# Patient Record
Sex: Female | Born: 2000 | Race: Black or African American | Hispanic: No | Marital: Single | Smoking: Never smoker
Health system: Southern US, Community
[De-identification: ages and names within clinical notes are randomized; demographics above are authoritative.]

## PROBLEM LIST (undated history)

## (undated) DIAGNOSIS — E063 Autoimmune thyroiditis: Secondary | ICD-10-CM

## (undated) HISTORY — DX: Autoimmune thyroiditis: E06.3

---
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
## 2021-01-14 ENCOUNTER — Other Ambulatory Visit (INDEPENDENT_AMBULATORY_CARE_PROVIDER_SITE_OTHER): Payer: Self-pay | Admitting: Family

## 2021-01-14 DIAGNOSIS — E039 Hypothyroidism, unspecified: Secondary | ICD-10-CM

## 2021-01-15 ENCOUNTER — Ambulatory Visit (INDEPENDENT_AMBULATORY_CARE_PROVIDER_SITE_OTHER): Payer: Medicaid Other | Admitting: Internal Medicine

## 2021-01-15 ENCOUNTER — Other Ambulatory Visit: Payer: Self-pay

## 2021-01-15 ENCOUNTER — Encounter: Payer: Self-pay | Admitting: Internal Medicine

## 2021-01-15 VITALS — BP 110/72 | HR 70 | Ht 62.02 in | Wt 139.2 lb

## 2021-01-15 DIAGNOSIS — E063 Autoimmune thyroiditis: Secondary | ICD-10-CM

## 2021-01-15 DIAGNOSIS — N926 Irregular menstruation, unspecified: Secondary | ICD-10-CM

## 2021-01-15 DIAGNOSIS — E039 Hypothyroidism, unspecified: Secondary | ICD-10-CM

## 2021-01-15 MED ORDER — LEVOTHYROXINE SODIUM 100 MCG PO TABS: 100.0000 ug | ORAL_TABLET | Freq: Every day | ORAL | 3 refills | Status: DC

## 2021-01-15 NOTE — Progress Notes (Signed)
Name: Jamie [name]  MRN/ DOB: [id number], 25-Oct-2000    Age/ Sex: 20 y.o., female     PCP: Ok Edwards, MD   Reason for Endocrinology Evaluation: 06/29/2020     Initial Endocrinology Clinic Visit: Hypothyroidism    Jamie [name] IDENTIFIER: Jamie [name] is a 20 y.o., female with a past medical history of hypothyroidism. Jamie [name] has followed with [facility] since 06/29/2020 for consultative assistance with management of her Hypothyroidism.   HISTORICAL SUMMARY:  Jamie [name] was diagnosed with hypothyroidism secondary to Hashimoto's Thyroiditis at the age of 13 with a TSh of 110 uIU/mL with low FT4 at 0.3 ng/dL and elevated TPO AB's > 900 IU/mL   No FH of thyroid disease   SUBJECTIVE:    Today (01/15/2021):  Jamie [name] is here for Hypothyroidism  Jamie [name] has ran out of Levothyroxine for ~ 2 month   Denies constipation, or weight gain  Has local neck swelling   Denies depression  LMP a month - irregular   Levothyroxine 100 mcg daily   HISTORY:  Past Medical History:  Past Medical History:  Diagnosis Date   Acquired autoimmune hypothyroidism    Dx 12/2014, TSH 110, FT4 0.3   Past Surgical History: No past surgical history on file. Social History:  reports that Jamie [name] has never smoked. Jamie [name] has never used smokeless tobacco. No history on file for alcohol use and drug use. Family History:  Family History  Problem Relation Age of Onset   Healthy Mother    Healthy Father      HOME MEDICATIONS: Allergies as of 01/15/2021   No Known Allergies      Medication List        Accurate as of January 15, 2021  2:43 PM. If you have any questions, ask your nurse or doctor.          levothyroxine 100 MCG tablet Commonly known as: SYNTHROID Take 1 tablet (100 mcg total) by mouth daily. What changed: See the new instructions. Changed by: Dorita Sciara, MD          OBJECTIVE:   PHYSICAL EXAM: VS: BP 110/72 (BP Location: Left Arm,  Jamie [name] Position: Sitting, Cuff Size: Small)   Pulse 70   Ht 5' 2.02" (1.575 m)   Wt 139 lb 3.2 oz (63.1 kg)   SpO2 98%   BMI 25.44 kg/m    EXAM: General: Jamie [name] appears well and is in NAD  Neck: General: Supple without adenopathy. Thyroid: Thyroid prominent.    Lungs: Clear with good BS bilat with no rales, rhonchi, or wheezes  Heart: Auscultation: RRR.  Abdomen: Normoactive bowel sounds, soft, nontender, without masses or organomegaly palpable  Extremities:  BL LE: No pretibial edema normal ROM and strength.  Mental Status: Judgment, insight: Intact Orientation: Oriented to time, place, and person Mood and affect: No depression, anxiety, or agitation     DATA REVIEWED:  Results for SAMINA, WEEKES (MRN [id number]) as of 01/15/2021 14:43  Ref. Range 07/07/2020 14:49  TSH Latest Ref Range: 0.40 - 5.00 uIU/mL 2.56     ASSESSMENT / PLAN / RECOMMENDATIONS:   Hashimoto's thyroiditis:  - Jamie [name] is clinically euthyroid  - Has noted local neck swelling  - Jamie [name] had ran out of her Levothyroxine 2 months ago but her refill request has been going to her pediatric endocrinologist instead.  - Discussed risk of myxedema coma with untreated hypothyroidism  - Discussed importance of compliance   Medications   Restart Levothyroxine 100 MCG daily  2. Irregular Menstruation   - Prolactin, estradiol, LH and FSH have been normal  - Jamie [name] to start charting menstruations for the next 4 months  - Discussed increased risk of uterine cancer with unopposed estogen effect - If remains irregular with normalization of TSH, will consider COC  Labs in 8 weeks   F/U in 4 months      Signed electronically by: Mack Guise, MD  [facility]  [facility] [address] [place], [place] [address] Phone: [phone] FAX: [phone]      CC: Ok Edwards, MD [address] [place] [address] Phone: [phone]  Fax:  [phone]   Return to Endocrinology clinic as below: Future Appointments  Date Time Provider [place]  01/15/2021  3:00 PM Doni Widmer, Melanie Crazier, MD [facility] None

## 2021-01-15 NOTE — Patient Instructions (Signed)

## 2021-02-05 ENCOUNTER — Ambulatory Visit (HOSPITAL_COMMUNITY)
Admission: EM | Admit: 2021-02-05 | Discharge: 2021-02-05 | Disposition: A | Discharge status: Home / Self Care | Payer: Medicaid Other | Attending: Medical Oncology | Admitting: Medical Oncology

## 2021-02-05 ENCOUNTER — Encounter (HOSPITAL_COMMUNITY): Payer: Self-pay | Admitting: Emergency Medicine

## 2021-02-05 ENCOUNTER — Other Ambulatory Visit: Payer: Self-pay

## 2021-02-05 DIAGNOSIS — R112 Nausea with vomiting, unspecified: Secondary | ICD-10-CM | POA: Diagnosis not present

## 2021-02-05 DIAGNOSIS — G43111 Migraine with aura, intractable, with status migrainosus: Secondary | ICD-10-CM

## 2021-02-05 LAB — POCT URINALYSIS DIPSTICK, ED / UC
Glucose, UA: NEGATIVE mg/dL
Ketones, ur: 40 mg/dL — AB
Leukocytes,Ua: NEGATIVE
Nitrite: NEGATIVE
Protein, ur: NEGATIVE mg/dL
Specific Gravity, Urine: 1.015 (ref 1.005–1.030)
Urobilinogen, UA: 0.2 mg/dL (ref 0.0–1.0)
pH: 5.5 (ref 5.0–8.0)

## 2021-02-05 LAB — POC URINE PREG, ED: Preg Test, Ur: NEGATIVE

## 2021-02-05 MED ORDER — ONDANSETRON 4 MG PO TBDP
4.0000 mg | ORAL_TABLET | Freq: Once | ORAL | Status: AC
  Administered 2021-02-05: 4 mg via ORAL

## 2021-02-05 MED ORDER — ONDANSETRON 4 MG PO TBDP
ORAL_TABLET | ORAL | Status: AC
  Filled 2021-02-05: qty 1

## 2021-02-05 MED ORDER — KETOROLAC TROMETHAMINE 30 MG/ML IJ SOLN
30.0000 mg | Freq: Once | INTRAMUSCULAR | Status: AC
  Administered 2021-02-05: 30 mg via INTRAMUSCULAR

## 2021-02-05 MED ORDER — KETOROLAC TROMETHAMINE 30 MG/ML IJ SOLN
INTRAMUSCULAR | Status: AC
  Filled 2021-02-05: qty 1

## 2021-02-05 NOTE — ED Triage Notes (Signed)
Pt c/o headache and vomiting since Monday. Reports fevers as well. Taking advil for fevers

## 2021-02-05 NOTE — ED Provider Notes (Signed)
[facility]    CSN: [id number] Arrival date & time: 02/05/21  1312      History   Chief Complaint Chief Complaint  Patient presents with   Emesis   Headache    HPI Zania Kalisz is a 20 y.o. female. She presents with her cousin who provides most of the history for patient. Pt seems apprehensive to answer questions and some answers are "yes/no".    HPI  Emesis: Patient with a history of thyroid disease presents with a history of headache and vomiting since Monday.  Vomiting occurs every time she tries to eat and is non-bloody. She has tried Pepto for symptoms without relief. She has not tried anything for headache. Headache 10/10 all over. She first reports that she does not get headaches often but then states that she gets a similar headache often. Additional symptoms include suspected fever which has resolved.   Past Medical History:  Diagnosis Date   Acquired autoimmune hypothyroidism    Dx 12/2014, TSH 110, FT4 0.3    Patient Active Problem List   Diagnosis Date Noted   Hashimoto's thyroiditis 06/29/2020   Irregular menses 06/29/2020   Diagnosis deferred 06/03/2019   Psychosocial stressors 05/07/2015   Primary hypothyroidism 01/21/2015   Goiter 01/20/2015   Failed vision screen 01/20/2015    History reviewed. No pertinent surgical history.  OB History   No obstetric history on file.      Home Medications    Prior to Admission medications   Medication Sig Start Date End Date Taking? Authorizing Provider  levothyroxine (SYNTHROID) 100 MCG tablet Take 1 tablet (100 mcg total) by mouth daily. 01/15/21   Shamleffer, Melanie Crazier, MD    Family History Family History  Problem Relation Age of Onset   Healthy Mother    Healthy Father     Social History Social History   Tobacco Use   Smoking status: Never   Smokeless tobacco: Never     Allergies   Patient has no known allergies.   Review of Systems Review of Systems  As stated  above in HPI Physical Exam Triage Vital Signs ED Triage Vitals  Enc Vitals Group     BP 02/05/21 1426 90/69     Pulse Rate 02/05/21 1426 87     Resp 02/05/21 1426 16     Temp 02/05/21 1426 98.8 F (37.1 C)     Temp Source 02/05/21 1426 Oral     SpO2 02/05/21 1426 99 %     Weight --      Height --      Head Circumference --      Peak Flow --      Pain Score 02/05/21 1422 10     Pain Loc --      Pain Edu? --      Excl. in [place]? --    No data found.  Updated Vital Signs BP 90/69 (BP Location: Left Arm)   Pulse 87   Temp 98.8 F (37.1 C) (Oral)   Resp 16   LMP 01/11/2021   SpO2 99%   Physical Exam Vitals and nursing note reviewed.  Constitutional:      General: She is not in acute distress.    Appearance: She is well-developed. She is not ill-appearing, toxic-appearing or diaphoretic.  HENT:     Head: Normocephalic and atraumatic.     Mouth/Throat:     Mouth: Mucous membranes are moist.     Pharynx: Oropharynx is clear.  Eyes:  General: No scleral icterus.    Extraocular Movements: Extraocular movements intact.     Right eye: Normal extraocular motion and no nystagmus.     Left eye: Normal extraocular motion and no nystagmus.     Pupils: Pupils are equal, round, and reactive to light. Pupils are equal.     Right eye: Pupil is round and reactive.     Left eye: Pupil is round and reactive.  Cardiovascular:     Rate and Rhythm: Normal rate and regular rhythm.     Heart sounds: Normal heart sounds.  Pulmonary:     Effort: Pulmonary effort is normal.     Breath sounds: Normal breath sounds.  Musculoskeletal:     Cervical back: Normal range of motion and neck supple. No rigidity.  Lymphadenopathy:     Cervical: No cervical adenopathy.  Skin:    General: Skin is warm.     Coloration: Skin is not cyanotic or pale.     Findings: No rash.  Neurological:     Mental Status: She is alert.     Cranial Nerves: No cranial nerve deficit or facial asymmetry.     Sensory:  No sensory deficit.     Motor: No weakness.     Gait: Gait normal.     Deep Tendon Reflexes: Reflexes normal.     Comments: Flat affect.      UC Treatments / Results  Labs (all labs ordered are listed, but only abnormal results are displayed) Labs Reviewed - No data to display  EKG   Radiology No results found.  Procedures Procedures (including critical care time)  Medications Ordered in UC Medications - No data to display  Initial Impression / Assessment and Plan / UC Course  I have reviewed the triage vital signs and the nursing notes.  Pertinent labs & imaging results that were available during my care of the patient were reviewed by me and considered in my medical decision making (see chart for details).     New. Urine HCG negative, shows some signs of dehydration. No sign of infection. I discussed my concerns with patient and her family member about her headache.  Initially she said that she does not get headaches often but then changed her story and said that she gets headaches fairly frequently.  They wish to avoid the emergency room as much as possible.  They have requested a trial of outpatient medications first.  I am agreeable with the low-dose Toradol along with Zofran to see if this helps with her symptoms.  Fluids and electrolytes encouraged.  If symptoms fail to improve or should she have any worsening symptoms she needs to go directly to the emergency room. Final Clinical Impressions(s) / UC Diagnoses   Final diagnoses:  None   Discharge Instructions   None    ED Prescriptions   None    PDMP not reviewed this encounter.   Hughie Closs, [place] 02/05/21 1537

## 2021-02-06 ENCOUNTER — Inpatient Hospital Stay (HOSPITAL_COMMUNITY)
Admission: EM | Admit: 2021-02-06 | Discharge: 2021-03-16 | DRG: 004 | Disposition: A | Discharge status: Other Acute Inpatient Hospital | Payer: Medicaid Other | Attending: Critical Care Medicine | Admitting: Critical Care Medicine

## 2021-02-06 ENCOUNTER — Other Ambulatory Visit: Payer: Self-pay

## 2021-02-06 ENCOUNTER — Encounter (HOSPITAL_COMMUNITY): Payer: Self-pay | Admitting: Emergency Medicine

## 2021-02-06 DIAGNOSIS — Z810 Family history of intellectual disabilities: Secondary | ICD-10-CM

## 2021-02-06 DIAGNOSIS — H5704 Mydriasis: Secondary | ICD-10-CM | POA: Diagnosis not present

## 2021-02-06 DIAGNOSIS — E8809 Other disorders of plasma-protein metabolism, not elsewhere classified: Secondary | ICD-10-CM | POA: Diagnosis not present

## 2021-02-06 DIAGNOSIS — R040 Epistaxis: Secondary | ICD-10-CM

## 2021-02-06 DIAGNOSIS — D279 Benign neoplasm of unspecified ovary: Secondary | ICD-10-CM

## 2021-02-06 DIAGNOSIS — R069 Unspecified abnormalities of breathing: Secondary | ICD-10-CM

## 2021-02-06 DIAGNOSIS — R768 Other specified abnormal immunological findings in serum: Secondary | ICD-10-CM | POA: Diagnosis present

## 2021-02-06 DIAGNOSIS — E039 Hypothyroidism, unspecified: Secondary | ICD-10-CM | POA: Diagnosis present

## 2021-02-06 DIAGNOSIS — Z79890 Hormone replacement therapy (postmenopausal): Secondary | ICD-10-CM

## 2021-02-06 DIAGNOSIS — J9601 Acute respiratory failure with hypoxia: Secondary | ICD-10-CM

## 2021-02-06 DIAGNOSIS — G0481 Other encephalitis and encephalomyelitis: Principal | ICD-10-CM

## 2021-02-06 DIAGNOSIS — F32A Depression, unspecified: Secondary | ICD-10-CM | POA: Diagnosis present

## 2021-02-06 DIAGNOSIS — R578 Other shock: Secondary | ICD-10-CM | POA: Diagnosis not present

## 2021-02-06 DIAGNOSIS — R0600 Dyspnea, unspecified: Secondary | ICD-10-CM

## 2021-02-06 DIAGNOSIS — G249 Dystonia, unspecified: Secondary | ICD-10-CM | POA: Diagnosis not present

## 2021-02-06 DIAGNOSIS — Z9911 Dependence on respirator [ventilator] status: Secondary | ICD-10-CM

## 2021-02-06 DIAGNOSIS — R21 Rash and other nonspecific skin eruption: Secondary | ICD-10-CM | POA: Diagnosis present

## 2021-02-06 DIAGNOSIS — R633 Feeding difficulties, unspecified: Secondary | ICD-10-CM | POA: Diagnosis present

## 2021-02-06 DIAGNOSIS — G908 Other disorders of autonomic nervous system: Secondary | ICD-10-CM | POA: Diagnosis present

## 2021-02-06 DIAGNOSIS — Z01818 Encounter for other preprocedural examination: Secondary | ICD-10-CM

## 2021-02-06 DIAGNOSIS — Z930 Tracheostomy status: Secondary | ICD-10-CM

## 2021-02-06 DIAGNOSIS — D489 Neoplasm of uncertain behavior, unspecified: Secondary | ICD-10-CM

## 2021-02-06 DIAGNOSIS — R339 Retention of urine, unspecified: Secondary | ICD-10-CM | POA: Diagnosis not present

## 2021-02-06 DIAGNOSIS — E441 Mild protein-calorie malnutrition: Secondary | ICD-10-CM | POA: Diagnosis not present

## 2021-02-06 DIAGNOSIS — K5900 Constipation, unspecified: Secondary | ICD-10-CM | POA: Diagnosis not present

## 2021-02-06 DIAGNOSIS — R627 Adult failure to thrive: Secondary | ICD-10-CM | POA: Diagnosis not present

## 2021-02-06 DIAGNOSIS — J15211 Pneumonia due to Methicillin susceptible Staphylococcus aureus: Secondary | ICD-10-CM | POA: Diagnosis not present

## 2021-02-06 DIAGNOSIS — Z20822 Contact with and (suspected) exposure to covid-19: Secondary | ICD-10-CM | POA: Diagnosis present

## 2021-02-06 DIAGNOSIS — G9349 Other encephalopathy: Secondary | ICD-10-CM | POA: Diagnosis present

## 2021-02-06 DIAGNOSIS — R4701 Aphasia: Secondary | ICD-10-CM | POA: Diagnosis present

## 2021-02-06 DIAGNOSIS — Z8661 Personal history of infections of the central nervous system: Secondary | ICD-10-CM

## 2021-02-06 DIAGNOSIS — J9690 Respiratory failure, unspecified, unspecified whether with hypoxia or hypercapnia: Secondary | ICD-10-CM

## 2021-02-06 DIAGNOSIS — G40901 Epilepsy, unspecified, not intractable, with status epilepticus: Secondary | ICD-10-CM | POA: Diagnosis not present

## 2021-02-06 DIAGNOSIS — R001 Bradycardia, unspecified: Secondary | ICD-10-CM | POA: Diagnosis not present

## 2021-02-06 DIAGNOSIS — Z781 Physical restraint status: Secondary | ICD-10-CM

## 2021-02-06 DIAGNOSIS — Z452 Encounter for adjustment and management of vascular access device: Secondary | ICD-10-CM

## 2021-02-06 DIAGNOSIS — F061 Catatonic disorder due to known physiological condition: Secondary | ICD-10-CM | POA: Diagnosis present

## 2021-02-06 DIAGNOSIS — G9341 Metabolic encephalopathy: Secondary | ICD-10-CM | POA: Diagnosis present

## 2021-02-06 DIAGNOSIS — Z4659 Encounter for fitting and adjustment of other gastrointestinal appliance and device: Secondary | ICD-10-CM

## 2021-02-06 DIAGNOSIS — I309 Acute pericarditis, unspecified: Secondary | ICD-10-CM | POA: Diagnosis not present

## 2021-02-06 DIAGNOSIS — Y95 Nosocomial condition: Secondary | ICD-10-CM | POA: Diagnosis not present

## 2021-02-06 DIAGNOSIS — Z1389 Encounter for screening for other disorder: Secondary | ICD-10-CM

## 2021-02-06 DIAGNOSIS — Z68.41 Body mass index (BMI) pediatric, 5th percentile to less than 85th percentile for age: Secondary | ICD-10-CM

## 2021-02-06 DIAGNOSIS — E871 Hypo-osmolality and hyponatremia: Secondary | ICD-10-CM | POA: Diagnosis not present

## 2021-02-06 DIAGNOSIS — R69 Illness, unspecified: Secondary | ICD-10-CM

## 2021-02-06 DIAGNOSIS — E876 Hypokalemia: Secondary | ICD-10-CM | POA: Diagnosis not present

## 2021-02-06 DIAGNOSIS — R482 Apraxia: Secondary | ICD-10-CM | POA: Diagnosis not present

## 2021-02-06 DIAGNOSIS — R112 Nausea with vomiting, unspecified: Secondary | ICD-10-CM

## 2021-02-06 DIAGNOSIS — Z0189 Encounter for other specified special examinations: Secondary | ICD-10-CM

## 2021-02-06 DIAGNOSIS — H5702 Anisocoria: Secondary | ICD-10-CM | POA: Diagnosis not present

## 2021-02-06 DIAGNOSIS — N179 Acute kidney failure, unspecified: Secondary | ICD-10-CM | POA: Diagnosis present

## 2021-02-06 DIAGNOSIS — F419 Anxiety disorder, unspecified: Secondary | ICD-10-CM | POA: Diagnosis present

## 2021-02-06 DIAGNOSIS — E873 Alkalosis: Secondary | ICD-10-CM | POA: Diagnosis not present

## 2021-02-06 DIAGNOSIS — R509 Fever, unspecified: Secondary | ICD-10-CM

## 2021-02-06 DIAGNOSIS — R4182 Altered mental status, unspecified: Secondary | ICD-10-CM

## 2021-02-06 DIAGNOSIS — Z9114 Patient's other noncompliance with medication regimen: Secondary | ICD-10-CM

## 2021-02-06 DIAGNOSIS — R4789 Other speech disturbances: Secondary | ICD-10-CM

## 2021-02-06 DIAGNOSIS — K117 Disturbances of salivary secretion: Secondary | ICD-10-CM | POA: Diagnosis not present

## 2021-02-06 DIAGNOSIS — R4020 Unspecified coma: Secondary | ICD-10-CM | POA: Diagnosis not present

## 2021-02-06 DIAGNOSIS — E063 Autoimmune thyroiditis: Secondary | ICD-10-CM | POA: Diagnosis present

## 2021-02-06 DIAGNOSIS — Z978 Presence of other specified devices: Secondary | ICD-10-CM

## 2021-02-06 DIAGNOSIS — J189 Pneumonia, unspecified organism: Secondary | ICD-10-CM

## 2021-02-06 DIAGNOSIS — D6489 Other specified anemias: Secondary | ICD-10-CM | POA: Diagnosis present

## 2021-02-06 DIAGNOSIS — G0490 Encephalitis and encephalomyelitis, unspecified: Secondary | ICD-10-CM

## 2021-02-06 DIAGNOSIS — I10 Essential (primary) hypertension: Secondary | ICD-10-CM | POA: Diagnosis present

## 2021-02-06 DIAGNOSIS — F29 Unspecified psychosis not due to a substance or known physiological condition: Secondary | ICD-10-CM | POA: Diagnosis present

## 2021-02-06 LAB — CBC WITH DIFFERENTIAL/PLATELET
Abs Immature Granulocytes: 0.01 10*3/uL (ref 0.00–0.07)
Basophils Absolute: 0 10*3/uL (ref 0.0–0.1)
Basophils Relative: 0 %
Eosinophils Absolute: 0 10*3/uL (ref 0.0–0.5)
Eosinophils Relative: 0 %
HCT: 40 % (ref 36.0–46.0)
Hemoglobin: 12.7 g/dL (ref 12.0–15.0)
Immature Granulocytes: 0 %
Lymphocytes Relative: 22 %
Lymphs Abs: 1.1 10*3/uL (ref 0.7–4.0)
MCH: 30 pg (ref 26.0–34.0)
MCHC: 31.8 g/dL (ref 30.0–36.0)
MCV: 94.3 fL (ref 80.0–100.0)
Monocytes Absolute: 0.3 10*3/uL (ref 0.1–1.0)
Monocytes Relative: 7 %
Neutro Abs: 3.6 10*3/uL (ref 1.7–7.7)
Neutrophils Relative %: 71 %
Platelets: 260 10*3/uL (ref 150–400)
RBC: 4.24 MIL/uL (ref 3.87–5.11)
RDW: 12.6 % (ref 11.5–15.5)
WBC: 5.1 10*3/uL (ref 4.0–10.5)
nRBC: 0 % (ref 0.0–0.2)

## 2021-02-06 LAB — COMPREHENSIVE METABOLIC PANEL
ALT: 17 U/L (ref 0–44)
AST: 23 U/L (ref 15–41)
Albumin: 4.4 g/dL (ref 3.5–5.0)
Alkaline Phosphatase: 41 U/L (ref 38–126)
Anion gap: 18 — ABNORMAL HIGH (ref 5–15)
BUN: 20 mg/dL (ref 6–20)
CO2: 22 mmol/L (ref 22–32)
Calcium: 9.5 mg/dL (ref 8.9–10.3)
Chloride: 97 mmol/L — ABNORMAL LOW (ref 98–111)
Creatinine, Ser: 1.2 mg/dL — ABNORMAL HIGH (ref 0.44–1.00)
GFR, Estimated: >60 mL/min (ref >60)
Glucose, Bld: 87 mg/dL (ref 70–99)
Potassium: 3.8 mmol/L (ref 3.5–5.1)
Sodium: 137 mmol/L (ref 135–145)
Total Bilirubin: 1.3 mg/dL — ABNORMAL HIGH (ref 0.3–1.2)
Total Protein: 8.3 g/dL — ABNORMAL HIGH (ref 6.5–8.1)

## 2021-02-06 LAB — I-STAT BETA HCG BLOOD, ED (MC, WL, AP ONLY): I-stat hCG, quantitative: <5 m[IU]/mL (ref <5)

## 2021-02-06 LAB — T4, FREE: Free T4: 0.74 ng/dL (ref 0.61–1.12)

## 2021-02-06 LAB — TSH: TSH: 10.095 u[IU]/mL — ABNORMAL HIGH (ref 0.350–4.500)

## 2021-02-06 LAB — ETHANOL: Alcohol, Ethyl (B): <10 mg/dL (ref <10)

## 2021-02-06 MED ORDER — LORAZEPAM 2 MG/ML IJ SOLN
1.0000 mg | Freq: Once | INTRAMUSCULAR | Status: AC
  Administered 2021-02-07: 1 mg via INTRAVENOUS
  Filled 2021-02-06: qty 1

## 2021-02-06 MED ORDER — SODIUM CHLORIDE 0.9 % IV BOLUS
1000.0000 mL | Freq: Once | INTRAVENOUS | Status: AC
  Administered 2021-02-06: 1000 mL via INTRAVENOUS

## 2021-02-06 NOTE — Consult Note (Signed)
NEUROLOGY CONSULTATION NOTE   Date of service: February 06, 2021 Patient Name: Jamie [name] MRN:  [id number] DOB:  2000/10/18 Reason for consult: "would not talk, hx of hashimoto's encephalitis" Requesting Provider: Courtney Paris, * _ _ _   _ __   _ __ _ _  __ __   _ __   __ _  History of Present Illness  Jamie [name] is a 20 y.o. female with PMH significant for hashimoto thyroiditis who presents with vomiting, headache and not eating for 4 days and not talking since today.  She is accompanied by her aunt who is in the room and provides history. Patient would not talk to me. She makes eye contact and with a lot of encouragement, will give me a high five but otherwise did not participate with history taking or exam.  Nothing particularly happened 4 days ago. She was seen in the Urgent care yesterday for headache with nausea/vomiting and unable to keep food or fluids and was tiven toradol and zofran. Since then, she has not talked so she was brought in for evaluation by family.  They are not particularly aware of any stressors. I had the aunt step outside for a minute and patient still would not talk to me or answer any of my questions.  Vitals are normal, she was able to walk to the bathroom with her arm around her aunt and use the bathroom herself. Labs with mildly elevated creatinine probably from poor po intake, chemistry otherwise with no significant abnormalities, CBC with no significant abnormalities. Her TSH is 10.09, unsure if patient is taking her synthroid. UA not particularly concerning for a UTI. EtOH levels are negative.   ROS   Unable to obtain as patient would not talk to me, she is mute throughout the evaluation.  Past History   Past Medical History:  Diagnosis Date  . Acquired autoimmune hypothyroidism    Dx 12/2014, TSH 110, FT4 0.3   History reviewed. No pertinent surgical history. Family History  Problem Relation Age of Onset  . Healthy Mother    . Healthy Father    Social History   Socioeconomic History  . Marital status: Single    Spouse name: Not on file  . Number of children: Not on file  . Years of education: Not on file  . Highest education level: Not on file  Occupational History  . Not on file  Tobacco Use  . Smoking status: Never  . Smokeless tobacco: Never  Substance and Sexual Activity  . Alcohol use: Not Currently  . Drug use: Not Currently  . Sexual activity: Not on file  Other Topics Concern  . Not on file  Social History Narrative   Lives at home with mom and maternal grandmother and two siblings attends [employer] school is in the 8th grade.    Social Determinants of Health   Financial Resource Strain: Not on file  Food Insecurity: Not on file  Transportation Needs: Not on file  Physical Activity: Not on file  Stress: Not on file  Social Connections: Not on file   No Known Allergies  Medications  (Not in a hospital admission)    Vitals   Vitals:   02/06/21 1745 02/06/21 1815 02/06/21 1845 02/06/21 2045  BP: 109/81 104/73 95/78 94/67   Pulse: 77 67 100 (!) 116  Resp: 16 16 16    Temp:      TempSrc:      SpO2: 100% 100% 98% 93%  There is no height or weight on file to calculate BMI.  Physical Exam   General: Laying comfortably in bed; in no acute distress.  HENT: Normal oropharynx and mucosa. Normal external appearance of ears and nose.  Neck: Supple, no pain or tenderness  CV: No JVD. No peripheral edema.  Pulmonary: Symmetric Chest rise. Normal respiratory effort.  Abdomen: Soft to touch, non-tender.  Ext: No cyanosis, edema, or deformity  Skin: No rash. Normal palpation of skin.   Musculoskeletal: Normal digits and nails by inspection. No clubbing.   Neurologic Examination  Mental status/Cognition: Alert, looks around makes eye contact with me and with her aunt. Speech/language: No speech throughout my evaluation. Does not answer questions, would give me a high five  with her R hand when encouraged but otherwise does not follow commands. Does not name any obejcts or repeat.  Cranial nerves:   CN II Pupils equal and reactive to light, no obvious VF defects.   CN III,IV,VI Conjugate gaze, tracks my face on the left as well as the right, no obvious deficit of the extraoccular movement.   CN V Unable to assess due to poor participation   CN VII no asymmetry, no nasolabial fold flattening   CN VIII Turns head towards speech   CN IX & X Unable to assess due to poor participation   CN XI Unable to assess due to poor participation   CN XII Unable to assess due to poor participation   Motor:  Muscle bulk: normal, tone normal Unable to do detailed strength testing due to poor participation. Des give a high five with RUE and when her LUE is held up off the bed, slowly drifts down to the bed. She was walking from the bathroom and bearing her own weight with aunt helping her on the side when I went in to evaluate her. She was able to sit and the edge of the bed on her own without any support.  Reflexes:  Right Left Comments  Pectoralis      Biceps (C5/6) 2 2   Brachioradialis (C5/6) 2 2    Triceps (C6/7) 2 2    Patellar (L3/4) 2 2    Achilles (S1) 2 2    Hoffman      Plantar     Jaw jerk    Sensation:  Light touch Grossly intact throughout   Pin prick    Temperature    Vibration   Proprioception    Coordination/Complex Motor:  -unable to assess but no obvious ataxia or incoordination. - Gait: Stride length short. Arm swing poor. Base width narrow.  Labs   CBC:  Recent Labs  Lab 02/06/21 1435  WBC 5.1  NEUTROABS 3.6  HGB 12.7  HCT 40.0  MCV 94.3  PLT 262    Basic Metabolic Panel:  Lab Results  Component Value Date   NA 137 02/06/2021   K 3.8 02/06/2021   CO2 22 02/06/2021   GLUCOSE 87 02/06/2021   BUN 20 02/06/2021   CREATININE 1.20 (H) 02/06/2021   CALCIUM 9.5 02/06/2021   GFRNONAA >60 02/06/2021   Lipid Panel:  Lab Results   Component Value Date   LDLCALC 56 01/19/2015   HgbA1c: No results found for: HGBA1C Urine Drug Screen: No results found for: LABOPIA, COCAINSCRNUR, LABBENZ, AMPHETMU, THCU, LABBARB  Alcohol Level     Component Value Date/Time   ETH <10 02/06/2021 1435    MRI Brain with and without contrast: pending  Impression   Jamie  [name] is a 20 y.o. female with PMH significant for Hashimoto thyroiditis who presents with vomiting, headache and not eating for 4 days and not talking since today. My primary impression is that this is probably psychiatric in nature. However, given her history hashimoto's encephalitis is on the differential. Presentation is not classic thou. Typically hashimoto's encephalitis presents with focal deficits or sometimes progressive cognitive impairment or seizures, or myoclonus, hyperreflexia or just psychosis or just isolated psychiatric symptoms.  Given her poor participation with exam, will get an MRI Brain with and without contrast, to evaluate for potential aphemia too which can rarely occur with a left precentral gyrus stroke  I will order antiTPO and thyroglobulin Ab along with ESR and CRP for further evaluation. Given that her clinical presentation is atypical for hashimoto's encephalitis, I do not feel strongly about pursuing LP at this time. Will consider, if the antibodies ar elevated or she develops symptoms concerning for hashimoto's encephalitis.  Recommendations  - MRI Brain with and without contrast - AntiTPO, Throglobulin Ab, ESR and CRP. - May consider LP pending workup above. Hold off for now. - Patient would benefit from Psych eval. ______________________________________________________________________  Plan discussed with Dr. Jabier Mutton with the ED team.  Thank you for the opportunity to take part in the care of this patient. If you have any further questions, please contact the neurology consultation attending.  Signed,  [place] Pager Number [phone] _ _ _   _ __   _ __ _ _  __ __   _ __   __ _

## 2021-02-06 NOTE — ED Notes (Signed)
The pt is on the call bell  every few minutes ?? Reason does not speak and sometimes uses the call bell 3 or 4 times after the staff is in the room  after shes been told not to .

## 2021-02-06 NOTE — ED Notes (Signed)
The pt has pulled out her iv

## 2021-02-06 NOTE — ED Triage Notes (Signed)
Pt to triage via [facility] from home.  Left work early on 10/7 with nausea, vomiting, dizziness, headache, and lethargy.  She last ate on Tuesday and has been in the bed since Wednesday.  Went to [facility] yesterday and prescribed Toradol and Zofran and F/U with PCP.  Spoke with mom on phone yesterday and texted friend yesterday evening at 1730.  Today pt is non-verbal.  She is alert and trying to get up out of wheelchair to walk out.  She will nod.  Parents have been out of town x 2 weeks and returning tomorrow.

## 2021-02-06 NOTE — ED Notes (Signed)
The pts family thought the pt needed to go to the br.  I took her to the br with her family  the pt just stood there and would not go into the br.  She walked back into her room and voided in her clothes  no talking just staring at any person talking to her.  The pts aunt at  the bedside  does not know what is going on with the pt   she has been advised that she needs to talk so we can let her go home

## 2021-02-06 NOTE — ED Notes (Signed)
The pt makes no effort to answer questions asked .  She makes eye contact  ????deliberate actions  // [place]  MAYBE WHAT THE PT WANTS
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
## 2021-02-06 NOTE — ED Provider Notes (Signed)
[facility] EMERGENCY DEPARTMENT Provider Note   CSN: [id number] Arrival date & time: 02/06/21  1408     History No chief complaint on file.   Jamie [name] is a 20 y.o. female.  The history is provided by medical records and a relative (aunt). The history is limited by the condition of the patient. No language interpreter was used.  Neurologic Problem This is a new problem. The current episode started 12 to 24 hours ago. The problem occurs constantly. The problem has not changed since onset.Associated symptoms include headaches. Pertinent negatives include no chest pain, no abdominal pain and no shortness of breath. Nothing aggravates the symptoms. Nothing relieves the symptoms. She has tried nothing for the symptoms. The treatment provided no relief.      Past Medical History:  Diagnosis Date   Acquired autoimmune hypothyroidism    Dx 12/2014, TSH 110, FT4 0.3    Patient Active Problem List   Diagnosis Date Noted   Hashimoto's thyroiditis 06/29/2020   Irregular menses 06/29/2020   Diagnosis deferred 06/03/2019   Psychosocial stressors 05/07/2015   Primary hypothyroidism 01/21/2015   Goiter 01/20/2015   Failed vision screen 01/20/2015    History reviewed. No pertinent surgical history.   OB History   No obstetric history on file.     Family History  Problem Relation Age of Onset   Healthy Mother    Healthy Father     Social History   Tobacco Use   Smoking status: Never   Smokeless tobacco: Never  Substance Use Topics   Alcohol use: Not Currently   Drug use: Not Currently    Home Medications Prior to Admission medications   Medication Sig Start Date End Date Taking? Authorizing Provider  levothyroxine (SYNTHROID) 100 MCG tablet Take 1 tablet (100 mcg total) by mouth daily. 01/15/21   Shamleffer, Melanie Crazier, MD    Allergies    Patient has no known allergies.  Review of Systems   Review of Systems  Constitutional:   Negative for chills, fatigue and fever.  HENT:  Negative for congestion.   Eyes:  Negative for visual disturbance.  Respiratory:  Negative for cough, chest tightness and shortness of breath.   Cardiovascular:  Negative for chest pain.  Gastrointestinal:  Negative for abdominal pain, constipation, diarrhea and vomiting.  Genitourinary:  Negative for dysuria and flank pain.  Musculoskeletal:  Negative for back pain and neck pain.  Skin:  Negative for rash and wound.  Neurological:  Positive for dizziness, speech difficulty, light-headedness and headaches. Negative for seizures, syncope, weakness and numbness.  Psychiatric/Behavioral:  Negative for agitation.   All other systems reviewed and are negative.  Physical Exam Updated Vital Signs BP 122/81 (BP Location: Right Arm)   Pulse 98   Temp 99.1 F (37.3 C) (Oral)   Resp 14   LMP 01/11/2021   SpO2 100%   Physical Exam Vitals and nursing note reviewed.  Constitutional:      General: She is not in acute distress.    Appearance: She is well-developed. She is not ill-appearing, toxic-appearing or diaphoretic.  HENT:     Head: Normocephalic and atraumatic.     Nose: No congestion or rhinorrhea.     Mouth/Throat:     Mouth: Mucous membranes are moist.     Pharynx: No oropharyngeal exudate or posterior oropharyngeal erythema.  Eyes:     Extraocular Movements: Extraocular movements intact.     Conjunctiva/sclera: Conjunctivae normal.     Pupils: Pupils are  equal, round, and reactive to light.  Cardiovascular:     Rate and Rhythm: Normal rate and regular rhythm.     Heart sounds: No murmur heard. Pulmonary:     Effort: Pulmonary effort is normal. No respiratory distress.     Breath sounds: Normal breath sounds. No wheezing, rhonchi or rales.  Chest:     Chest wall: No tenderness.  Abdominal:     General: Abdomen is flat.     Palpations: Abdomen is soft.     Tenderness: There is no abdominal tenderness. There is no right CVA  tenderness, left CVA tenderness, guarding or rebound.  Musculoskeletal:        General: No tenderness.     Cervical back: Neck supple. No tenderness.     Right lower leg: No edema.     Left lower leg: No edema.  Skin:    General: Skin is warm and dry.     Capillary Refill: Capillary refill takes less than 2 seconds.     Findings: No erythema.  Neurological:     Mental Status: She is alert.     Cranial Nerves: No facial asymmetry.     Sensory: No sensory deficit.     Motor: No weakness, abnormal muscle tone or seizure activity.     Gait: Gait normal.     Comments: Patient is having difficulty following finger-nose-finger testing bilaterally but was able to ambulate around the room.  She moves all extremities.  Symmetric smile.  She will not speak whatsoever.  Pupils symmetric and reactive with normal extraocular movements.    ED Results / Procedures / Treatments   Labs (all labs ordered are listed, but only abnormal results are displayed) Labs Reviewed  COMPREHENSIVE METABOLIC PANEL - Abnormal; Notable for the following components:      Result Value   Chloride 97 (*)    Creatinine, Ser 1.20 (*)    Total Protein 8.3 (*)    Total Bilirubin 1.3 (*)    Anion gap 18 (*)    All other components within normal limits  TSH - Abnormal; Notable for the following components:   TSH 10.095 (*)    All other components within normal limits  URINE CULTURE  ETHANOL  CBC WITH DIFFERENTIAL/PLATELET  T4, FREE  RAPID URINE DRUG SCREEN, HOSP PERFORMED  URINALYSIS, ROUTINE W REFLEX MICROSCOPIC  T3, FREE  SEDIMENTATION RATE  C-REACTIVE PROTEIN  THYROID PEROXIDASE ANTIBODY  THYROGLOBULIN ANTIBODY  I-STAT BETA HCG BLOOD, ED ([facility], [facility], [facility] ONLY)    EKG EKG Interpretation  Date/Time:  Saturday February 06 2021 14:25:37 EDT Ventricular Rate:  115 PR Interval:  118 QRS Duration: 80 QT Interval:  382 QTC Calculation: 528 R Axis:   94 Text Interpretation: Sinus tachycardia Right atrial  enlargement Rightward axis Nonspecific ST abnormality Abnormal ECG No prior ECG for comparison. Prolonged ECG. NO STEMI  Confirmed by Antony Blackbird [phone]) on 02/06/2021 3:23:13 PM  Radiology No results found.  Procedures Procedures   Medications Ordered in ED Medications  LORazepam (ATIVAN) injection 1 mg (has no administration in time range)  sodium chloride 0.9 % bolus 1,000 mL (1,000 mLs Intravenous New Bag/Given 02/06/21 2300)    ED Course  I have reviewed the triage vital signs and the nursing notes.  Pertinent labs & imaging results that were available during my care of the patient were reviewed by me and considered in my medical decision making (see chart for details).    MDM Rules/Calculators/A&P  Jamie [name] is a 20 y.o. female with a past medical history significant for acquired autoimmune hypothyroidism after Hashimoto's thyroiditis who presents with 1 week of reported headaches, nausea, vomiting, and 24 hours of a speech change.  According to aunt who accompanied patient, she is a Ship broker and works and normally has no difficulty with communication.  The aunt reports that the patient's mother spoke to her on the phone last night and the patient was communicating normally but still complaining of the headache and nausea and vomiting occasionally.  Patient was seen at another facility yesterday for headache and was discharged home.  According to the aunt, today, patient has been acting abnormal and not speaking whatsoever.  The aunt says that she has not set a word in the hour she has been with her.  Patient is not answering any questions.  On exam, lungs are clear and chest is nontender.  Abdomen is nontender.  No rashes.  Normal gait.  Patient has strength in all extremities and appears to have sensation in all extremities.  Symmetric smile.  Patient has difficulty with finger-nose-finger testing bilaterally as she was not following commands  as well with this.  Patient is aphasic and not speaking.  Patient had symmetric pupils with normal extraocular movements.   Family does not report she that she has had any trauma and patient will not say yes or no if she is had any injuries.  Clinically I am not certain why patient is aphasic and not answering any questions and having altered mental status.  I discussed with family the possibility of a thyroid problem causing this, stroke, tumor, MS, or multitude of other causes and we decided to give the patient some fluids, get labs, and after speaking with neurology, get MRI to further evaluate.  Neurology was going to evaluate patient as well given her abnormal speech and exam.  We discussed this also could be a mental health problem or a psychotic issue as she does have documented psychosocial stressors in the chart but family reports she is never had this before.  Patient was standing up at 1 point trying to leave however as she is not able to say a word to me and expressed to me that she understands what is going on, what her problems are, and the consequences of leaving, I do not feel she is safe to leave at this time.  Patient will be given some medication for letting her rest and agitation with some Ativan for the MRI.  Anticipate reassessment after MRI to determine decision.  Neurology will come see the patient and we will see what the MRI shows to determine disposition.  If medical work-up is completely reassuring, I suspect she will need TTS evaluation if this is a psychiatric cause of her not talking.  10:18 PM Neurology evaluated the patient and agree with getting the MRI.  If the MRI is reassuring, they suspect this is more functional and psychiatric related.  They agree with a TTS consult if MRI is reassuring.  We discussed the possibility of needing  a lumbar puncture however neurology has lower suspicion for Hashimoto encephalitis or reason for LP at this time.  Will get the MRI and  then likely TTS if it is reassuring.  If there is acute abnormality, we will likely call neurology again.  Care transferred to oncoming team awaiting for MRI results.   Final Clinical Impression(s) / ED Diagnoses Final diagnoses:  Other speech disturbance  Altered mental status, unspecified  altered mental status type      Clinical Impression: 1. Other speech disturbance   2. Altered mental status, unspecified altered mental status type     Disposition: Care transferred to oncoming team awaiting for MRI results.  If MRI is reassuring, anticipate TTS consultation for altered mental status and speech abnormalities possibility to a psychosis  This note was prepared with assistance of Dragon voice recognition software. Occasional wrong-word or sound-a-like substitutions may have occurred due to the inherent limitations of voice recognition software.     Gowri Suchan, Gwenyth Allegra, MD 02/06/21 [phone]
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
## 2021-02-06 NOTE — ED Notes (Signed)
aNOTHER AUNT IS AT THE BEDSIDE NOW  THERE HAVE BEEN 5 PEOPLE IN AT DIFFERENT TIMES TO SEE THE PT

## 2021-02-07 ENCOUNTER — Emergency Department (HOSPITAL_COMMUNITY): Payer: Medicaid Other

## 2021-02-07 DIAGNOSIS — E876 Hypokalemia: Secondary | ICD-10-CM | POA: Diagnosis not present

## 2021-02-07 DIAGNOSIS — R4020 Unspecified coma: Secondary | ICD-10-CM | POA: Diagnosis not present

## 2021-02-07 DIAGNOSIS — Z789 Other specified health status: Secondary | ICD-10-CM | POA: Diagnosis not present

## 2021-02-07 DIAGNOSIS — R4182 Altered mental status, unspecified: Secondary | ICD-10-CM

## 2021-02-07 DIAGNOSIS — R339 Retention of urine, unspecified: Secondary | ICD-10-CM | POA: Diagnosis not present

## 2021-02-07 DIAGNOSIS — R578 Other shock: Secondary | ICD-10-CM | POA: Diagnosis not present

## 2021-02-07 DIAGNOSIS — G0481 Other encephalitis and encephalomyelitis: Secondary | ICD-10-CM | POA: Diagnosis not present

## 2021-02-07 DIAGNOSIS — J9601 Acute respiratory failure with hypoxia: Secondary | ICD-10-CM | POA: Diagnosis not present

## 2021-02-07 DIAGNOSIS — I309 Acute pericarditis, unspecified: Secondary | ICD-10-CM | POA: Diagnosis not present

## 2021-02-07 DIAGNOSIS — R4589 Other symptoms and signs involving emotional state: Secondary | ICD-10-CM | POA: Diagnosis not present

## 2021-02-07 DIAGNOSIS — F32A Depression, unspecified: Secondary | ICD-10-CM | POA: Diagnosis not present

## 2021-02-07 DIAGNOSIS — J15211 Pneumonia due to Methicillin susceptible Staphylococcus aureus: Secondary | ICD-10-CM | POA: Diagnosis not present

## 2021-02-07 DIAGNOSIS — Z515 Encounter for palliative care: Secondary | ICD-10-CM | POA: Diagnosis not present

## 2021-02-07 DIAGNOSIS — Z930 Tracheostomy status: Secondary | ICD-10-CM | POA: Diagnosis not present

## 2021-02-07 DIAGNOSIS — E063 Autoimmune thyroiditis: Secondary | ICD-10-CM | POA: Diagnosis not present

## 2021-02-07 DIAGNOSIS — R569 Unspecified convulsions: Secondary | ICD-10-CM | POA: Diagnosis not present

## 2021-02-07 DIAGNOSIS — R000 Tachycardia, unspecified: Secondary | ICD-10-CM | POA: Diagnosis not present

## 2021-02-07 DIAGNOSIS — R609 Edema, unspecified: Secondary | ICD-10-CM | POA: Diagnosis not present

## 2021-02-07 DIAGNOSIS — G40901 Epilepsy, unspecified, not intractable, with status epilepticus: Secondary | ICD-10-CM | POA: Diagnosis not present

## 2021-02-07 DIAGNOSIS — N179 Acute kidney failure, unspecified: Secondary | ICD-10-CM | POA: Diagnosis not present

## 2021-02-07 DIAGNOSIS — F29 Unspecified psychosis not due to a substance or known physiological condition: Secondary | ICD-10-CM | POA: Diagnosis not present

## 2021-02-07 DIAGNOSIS — Y95 Nosocomial condition: Secondary | ICD-10-CM | POA: Diagnosis not present

## 2021-02-07 DIAGNOSIS — E441 Mild protein-calorie malnutrition: Secondary | ICD-10-CM | POA: Diagnosis not present

## 2021-02-07 DIAGNOSIS — Z9911 Dependence on respirator [ventilator] status: Secondary | ICD-10-CM | POA: Diagnosis not present

## 2021-02-07 DIAGNOSIS — Z7189 Other specified counseling: Secondary | ICD-10-CM | POA: Diagnosis not present

## 2021-02-07 DIAGNOSIS — R579 Shock, unspecified: Secondary | ICD-10-CM | POA: Diagnosis not present

## 2021-02-07 DIAGNOSIS — G9349 Other encephalopathy: Secondary | ICD-10-CM | POA: Diagnosis not present

## 2021-02-07 DIAGNOSIS — F419 Anxiety disorder, unspecified: Secondary | ICD-10-CM | POA: Diagnosis not present

## 2021-02-07 DIAGNOSIS — E873 Alkalosis: Secondary | ICD-10-CM | POA: Diagnosis not present

## 2021-02-07 DIAGNOSIS — G9341 Metabolic encephalopathy: Secondary | ICD-10-CM | POA: Diagnosis not present

## 2021-02-07 DIAGNOSIS — J988 Other specified respiratory disorders: Secondary | ICD-10-CM | POA: Diagnosis not present

## 2021-02-07 DIAGNOSIS — I10 Essential (primary) hypertension: Secondary | ICD-10-CM | POA: Diagnosis not present

## 2021-02-07 DIAGNOSIS — G249 Dystonia, unspecified: Secondary | ICD-10-CM | POA: Diagnosis not present

## 2021-02-07 DIAGNOSIS — F061 Catatonic disorder due to known physiological condition: Secondary | ICD-10-CM | POA: Diagnosis not present

## 2021-02-07 DIAGNOSIS — E039 Hypothyroidism, unspecified: Secondary | ICD-10-CM | POA: Diagnosis not present

## 2021-02-07 DIAGNOSIS — G0490 Encephalitis and encephalomyelitis, unspecified: Secondary | ICD-10-CM | POA: Diagnosis not present

## 2021-02-07 DIAGNOSIS — E871 Hypo-osmolality and hyponatremia: Secondary | ICD-10-CM | POA: Diagnosis not present

## 2021-02-07 DIAGNOSIS — Z20822 Contact with and (suspected) exposure to covid-19: Secondary | ICD-10-CM | POA: Diagnosis not present

## 2021-02-07 DIAGNOSIS — R4701 Aphasia: Secondary | ICD-10-CM | POA: Diagnosis not present

## 2021-02-07 LAB — RAPID URINE DRUG SCREEN, HOSP PERFORMED
Amphetamines: NOT DETECTED
Barbiturates: NOT DETECTED
Benzodiazepines: NOT DETECTED
Cocaine: NOT DETECTED
Opiates: NOT DETECTED
Tetrahydrocannabinol: NOT DETECTED

## 2021-02-07 LAB — COMPREHENSIVE METABOLIC PANEL
ALT: 13 U/L (ref 0–44)
AST: 15 U/L (ref 15–41)
Albumin: 3.4 g/dL — ABNORMAL LOW (ref 3.5–5.0)
Alkaline Phosphatase: 31 U/L — ABNORMAL LOW (ref 38–126)
Anion gap: 8 (ref 5–15)
BUN: 13 mg/dL (ref 6–20)
CO2: 23 mmol/L (ref 22–32)
Calcium: 8.6 mg/dL — ABNORMAL LOW (ref 8.9–10.3)
Chloride: 106 mmol/L (ref 98–111)
Creatinine, Ser: 0.76 mg/dL (ref 0.44–1.00)
GFR, Estimated: >60 mL/min (ref >60)
Glucose, Bld: 102 mg/dL — ABNORMAL HIGH (ref 70–99)
Potassium: 3.3 mmol/L — ABNORMAL LOW (ref 3.5–5.1)
Sodium: 137 mmol/L (ref 135–145)
Total Bilirubin: 1.3 mg/dL — ABNORMAL HIGH (ref 0.3–1.2)
Total Protein: 6.9 g/dL (ref 6.5–8.1)

## 2021-02-07 LAB — URINALYSIS, ROUTINE W REFLEX MICROSCOPIC
Bilirubin Urine: NEGATIVE
Glucose, UA: NEGATIVE mg/dL
Ketones, ur: 80 mg/dL — AB
Leukocytes,Ua: NEGATIVE
Nitrite: NEGATIVE
Protein, ur: NEGATIVE mg/dL
Specific Gravity, Urine: 1.017 (ref 1.005–1.030)
pH: 5 (ref 5.0–8.0)

## 2021-02-07 LAB — ACETAMINOPHEN LEVEL: Acetaminophen (Tylenol), Serum: <10 ug/mL — ABNORMAL LOW (ref 10–30)

## 2021-02-07 LAB — PHOSPHORUS: Phosphorus: 2.3 mg/dL — ABNORMAL LOW (ref 2.5–4.6)

## 2021-02-07 LAB — RESP PANEL BY RT-PCR (FLU A&B, COVID) ARPGX2
Influenza A by PCR: NEGATIVE
Influenza B by PCR: NEGATIVE
SARS Coronavirus 2 by RT PCR: NEGATIVE

## 2021-02-07 LAB — SEDIMENTATION RATE: Sed Rate: 15 mm/hr (ref 0–22)

## 2021-02-07 LAB — FOLATE: Folate: 21.4 ng/mL (ref >5.9)

## 2021-02-07 LAB — C-REACTIVE PROTEIN: CRP: 0.7 mg/dL (ref <1.0)

## 2021-02-07 LAB — VITAMIN D 25 HYDROXY (VIT D DEFICIENCY, FRACTURES): Vit D, 25-Hydroxy: 29.07 ng/mL — ABNORMAL LOW (ref 30–100)

## 2021-02-07 LAB — HIV ANTIBODY (ROUTINE TESTING W REFLEX): HIV Screen 4th Generation wRfx: NONREACTIVE

## 2021-02-07 LAB — VITAMIN B12: Vitamin B-12: 692 pg/mL (ref 180–914)

## 2021-02-07 LAB — MAGNESIUM: Magnesium: 2.1 mg/dL (ref 1.7–2.4)

## 2021-02-07 MED ORDER — SODIUM CHLORIDE 0.9 % IV BOLUS
1000.0000 mL | Freq: Once | INTRAVENOUS | Status: AC
  Administered 2021-02-07: 1000 mL via INTRAVENOUS

## 2021-02-07 MED ORDER — ENOXAPARIN SODIUM 40 MG/0.4ML IJ SOSY
40.0000 mg | PREFILLED_SYRINGE | INTRAMUSCULAR | Status: DC
  Administered 2021-02-07: 40 mg via SUBCUTANEOUS
  Filled 2021-02-07: qty 0.4

## 2021-02-07 MED ORDER — LORAZEPAM 2 MG/ML IJ SOLN
2.0000 mg | Freq: Once | INTRAMUSCULAR | Status: AC
  Administered 2021-02-07: 2 mg via INTRAVENOUS
  Filled 2021-02-07: qty 1

## 2021-02-07 MED ORDER — POTASSIUM CHLORIDE CRYS ER 20 MEQ PO TBCR
40.0000 meq | EXTENDED_RELEASE_TABLET | Freq: Two times a day (BID) | ORAL | Status: AC
  Administered 2021-02-07 (×2): 40 meq via ORAL
  Filled 2021-02-07 (×2): qty 2

## 2021-02-07 MED ORDER — ACETAMINOPHEN 650 MG RE SUPP: 650.0000 mg | Freq: Four times a day (QID) | RECTAL | Status: DC | PRN

## 2021-02-07 MED ORDER — ACETAMINOPHEN 325 MG PO TABS
650.0000 mg | ORAL_TABLET | Freq: Four times a day (QID) | ORAL | Status: DC | PRN
  Administered 2021-02-07 – 2021-02-14 (×2): 650 mg via ORAL
  Filled 2021-02-07 (×2): qty 2

## 2021-02-07 MED ORDER — LORAZEPAM 2 MG/ML IJ SOLN: 3.0000 mg | Freq: Once | INTRAMUSCULAR | Status: DC

## 2021-02-07 NOTE — Progress Notes (Signed)
Per patient's RN, patient attempting to get out of bed and leave. She is not redirectable. Restraints placed, will attempt to have another sitter if available. Patient is a risk to herself secondary to her persistent confusion. Patient's family friend at bedside, however, patient still attempting to leave and disoriented.

## 2021-02-07 NOTE — H&P (Signed)
Date: 02/07/2021               Patient Name:  Jamie [name] MRN: [id number]  DOB: 2001-02-16 Age / Sex: 20 y.o., female   PCP: Ok Edwards, MD         Medical Service: Internal Medicine Teaching Service         Attending Physician: Dr. Charise Killian, MD    First Contact: Leone Haven, DO Pager: [phone]  Second Contact: Sanjuana Letters, DO Pager: Maryjean Morn [phone]       After Hours (After 5p/  First Contact Pager: [phone]  weekends / holidays): Second Contact Pager: [phone]   SUBJECTIVE  Chief Complaint: Nausea, vomiting, decreased interactions with family members  History of Present Illness: Jamie [name] is a 20 y.o. female with a pertinent PMH of hypothyroidism, who presents to [facility] with decreased interaction with family, mutism, nausea, and vomiting.  Information collected predominantly by aunt at bedside, and mother on the phone as patient is minimally interactive with the interviewer.  Patient symptoms began approximately 5 days ago when she began to have decreased p.o. intake due to nausea and vomiting.  Her mother additionally notes that patient has not been sleeping well during this time period.  Her symptoms continued and while she was working at [employer], her manager told her to take the day off because she appeared unwell. She presented an urgent care 2 days ago and was given Toradol and Zofran for headaches and nausea.  On chart review, it appears the patient was apprehensive and did not have much interaction with the healthcare provider provider. She did test for COVID which was negative.  Began to have minimal interactions with her family members and for the past 24 hours she has not talked to anybody.  Due to her new presentation of mutism, she was presented by family to the ED for further evaluation. Her aunt at bedside states that she is becoming more interactive with others. She is able answer some questions during our interview in short 3-5 word  sentences.  On initial presentation in the ED, the patient was not verbally interactive with staff,  feeling confused and stating she was confused.  Her beta-hCG is unremarkable, CBC was WNL, ethanol was negative, her CMP shows a creatinine of 1.20, with an anion gap of 18 and mildly hypochloremic with a chloride of 97.  Her TSH is elevated to 10.095, T4 was 0.74, CRP and sed rate were negative, and her urinalysis  Medications: No current facility-administered medications on file prior to encounter.   Current Outpatient Medications on File Prior to Encounter  Medication Sig Dispense Refill   levothyroxine (SYNTHROID) 100 MCG tablet Take 1 tablet (100 mcg total) by mouth daily. 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 Support -family Level of function -able to complete ADLs independently PCP -Claudean Kinds MD Substance use -denies tobacco, alcohol, drug use  Family History: Family History  Problem Relation Age of Onset   Healthy Mother    Healthy Father     Allergies: Allergies as of 02/06/2021   (No Known Allergies)    Review of Systems: A complete ROS was negative except as per HPI.   OBJECTIVE:  Physical Exam:  Blood pressure 95/69, pulse (!) 59, temperature 98.7 F (37.1 C), temperature source Oral, resp. rate (!) 28, last menstrual period 01/11/2021, SpO2 96 %. Physical Exam Constitutional:      General: She is not in acute distress.    Appearance: She is normal weight. She is not toxic-appearing or diaphoretic.     Comments: Laying comfortably in bed, intermittently converses in 3-5 word sentences, no acute distress.  Alert to self.  HENT:     Head: Normocephalic and atraumatic.  Eyes:     General: No scleral  icterus.       Right eye: No discharge.        Left eye: No discharge.     Extraocular Movements: Extraocular movements intact.     Conjunctiva/sclera: Conjunctivae normal.     Pupils: Pupils are equal, round, and reactive to light.  Cardiovascular:     Rate and Rhythm: Normal rate and regular rhythm.     Pulses: Normal pulses.     Heart sounds: Normal heart sounds. No murmur heard.   No friction rub. No gallop.  Pulmonary:     Effort: Pulmonary effort is normal. No respiratory distress.     Breath sounds: Normal breath sounds. No wheezing or rales.  Abdominal:     General: Abdomen is flat. Bowel sounds are normal. There is no distension.     Palpations: Abdomen is soft.     Tenderness: There is no abdominal tenderness. There is no guarding.  Musculoskeletal:        General: No swelling or tenderness.     Right lower leg: No edema.     Left lower leg: No edema.  Skin:    General: Skin is warm and dry.  Neurological:     Comments: Alert to self but not place or time. Redirectable. Intermittently conversant, able to state that she does not drink nor do drugs which is confirmed by mother on phone. Otherwise, minimally interactive.  Unable to perform full neurological examination in the setting of poor participation.  Pupils are round and reactive to light bilaterally, extraocular movements served bilaterally, Smile is symmetrical, shoulder shrug intact, and moving all extremities spontaneously.    Pertinent Labs: CBC    Component Value Date/Time   WBC 5.1 02/06/2021 1435   RBC 4.24 02/06/2021 1435   HGB 12.7 02/06/2021 1435   HCT 40.0 02/06/2021 1435   PLT 260 02/06/2021 1435   MCV 94.3 02/06/2021 1435   MCH 30.0 02/06/2021 1435   MCHC 31.8 02/06/2021 1435   RDW 12.6 02/06/2021 1435   LYMPHSABS 1.1 02/06/2021 1435   MONOABS 0.3 02/06/2021 1435   EOSABS 0.0 02/06/2021 1435   BASOSABS 0.0 02/06/2021 1435     CMP     Component Value Date/Time   NA 137 02/06/2021 1435    K 3.8 02/06/2021 1435   CL 97 (L) 02/06/2021 1435   CO2 22 02/06/2021 1435   GLUCOSE 87 02/06/2021 1435   BUN 20 02/06/2021 1435   CREATININE 1.20 (H) 02/06/2021 1435   CALCIUM 9.5 02/06/2021 1435   PROT 8.3 (H) 02/06/2021 1435   ALBUMIN 4.4 02/06/2021 1435   AST 23 02/06/2021 1435   ALT 17 02/06/2021 1435   ALKPHOS 41 02/06/2021 1435   BILITOT 1.3 (H) 02/06/2021 1435   GFRNONAA >60 02/06/2021 1435    Pertinent Imaging: CT HEAD WO CONTRAST (5MM)  Result Date: 02/07/2021 CLINICAL DATA:  AMS EXAM: CT HEAD WITHOUT CONTRAST TECHNIQUE: Contiguous axial images were obtained from  the base of the skull through the vertex without intravenous contrast. COMPARISON:  None. FINDINGS: Brain: No evidence of acute infarction, hemorrhage, hydrocephalus, extra-axial collection or mass lesion/mass effect. Vascular: No hyperdense vessel or unexpected calcification. Skull: Normal. Negative for fracture or focal lesion. Sinuses/Orbits: No acute finding. IMPRESSION: Negative head CT Electronically Signed   By: Jorje Guild M.D.   On: 02/07/2021 04:16    EKG: personally reviewed my interpretation is sinus tachycardia  ASSESSMENT & PLAN:  Assessment: Active Problems:   Altered mental status   Jamie [name] is a 20 y.o. 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 Initial CT scan reassuring with no structural abnormalities noted, MRI was deferred due to reassuring CT.  Patient is mildly hypochloremic, but otherwise her electrolytes and glucose are WNL. She has not had fevers, her CBC is unremarkable, which lowers infectious etiology in the differential.  LP was discussed with neurology during her ED course, but  will be deferred until her antibody test come back per neuro. UDS and ethanol were negative. CRP and sed rate are negative.  Her TSH is elevated to 10, free T4 is normal, and on chart review there has been concerned that Meri is inconsistently taking her medications. 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 Patient does additionally have a history of anxiety and depression. She has seen Garrett in the past through telehealth visits for interpersonal.  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 She is not on any medications for anxiety or depression, her presenting symptoms likely have a psychiatric component and psychiatry was consulted. Internal medicine was consulted for admission for further medical work-up. - Appreciate neurology recommendations - Appreciate psychiatry recommendations - Follow-up TPO and thyroglobulin antibodies test. These are typically 48-hour labs per ED provider. - Trend BMP electrolyte abnormalities - Follow-up urine culture results - Obtain vitamin B12 and folate level   #Hypothyroidism: Patient diagnosed with hypothyroidism secondary to [place] thyroiditis in 2016.  On chart review, they have been concerned the patient has been intermittently taking her medications.  Mother states that Yoshiye has not been taking her medications, patient states that she takes her Synthroid at night. TSH is elevated today at 10, free T4 is within normal limits.  Current home regimen is Synthroid 100 MCG daily. - Continue Synthroid 100 MCG daily - Follow-up T3  #Anxiety/Depression #Home Stressors: Patient had several [place] interviews over the past several years.  Her last appointment was in February 2021, at that time her GAD-7  score was 16, and PHQ 15 score was 7, which indicates a high anxiety level and low somatization level.  Additionally at home, evident her mother have disagreements over diet, her medication, education, and having a sibling with an intellectual disability and another with depression.  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  She is not on any current medications for anxiety.  At bedside, her aunt notes that the patient is worried about her future  endeavors, but does not want to be labeled as having "mental issues." given her symptoms of altered mental status today, with concern for a psychiatric component, psychiatry was consulted in the ED. - Appreciate psychiatry's recommendations  #Elevated Creatinine #AGMA: Patient presents with decreased p.o. intake, nausea, and vomiting over the past 4 days. Her creatinine is 1.20 with an unknown baseline.  She additionally has anion gap metabolic acidosis likely secondary to decreased p.o. intake.  Further supported by her urinalysis which showed ketones of 40 2 days ago and 80 today.  She has received 2 L of 0.9% normal saline in the ED.   - Trend BMP   #Hx of Vitamin D Insufficiency: Patient with a history of vitamin D deficiency vitamin D 25-hydroxy level of 29 4 years ago. Initially appears to have been related to diet for which the patient followed with a dietitian, but does not appear to have been reevaluated. - Obtain vitamin D 25 hydroxy  Best Practice: Diet: Regular diet VTE: enoxaparin (LOVENOX) injection 40 mg Start: 02/07/21 1000  Lovenox Code: Full Status: Inpatient with expected length of stay greater than 2 midnights. Anticipated Discharge Location:  Pending medical work-up and psych evaluation Barriers to Discharge: Continue medical work-up  Signature: Maudie Mercury, MD Internal Medicine Resident, PGY-3 [facility]  10:32 AM, 02/07/2021   Please contact the on call pager after 5 pm and on weekends at [phone].

## 2021-02-07 NOTE — ED Notes (Signed)
RN witness pt have difficulty swallowing food. Pt physically gagging . RN to notify MD.

## 2021-02-07 NOTE — ED Notes (Addendum)
Patient has removed all leads off, and B/P  cuff.

## 2021-02-07 NOTE — ED Notes (Signed)
Pt requesting food and drink. Pt okay for P.O. intake per MD Bero. Pt given drink and food.

## 2021-02-07 NOTE — Progress Notes (Signed)
Patient received to the floor, VSS, A and Oriented to self, skin is intact, no acute distress noticed, patient is 20 years old, no family members at the moment, she came with her neighbor which been told by  RN to contact her family in order to take the medical history and do the MRI consent over the phone since her family traveled to [place].

## 2021-02-07 NOTE — ED Notes (Addendum)
Patient seems to be very confused, she stated she needed to use bathroom, I assist patient to bedside toilet, but she didn't void while on toilet. She had taken all her leads off, and gown. I informed her that we need to continue to monitor her while here in the Ed. She then continue to repeat herself saying you tell my mom, but she never said what to tell her mom. I did place patient back onto monitor, and put gown on. Her Grandmother at bedside.

## 2021-02-07 NOTE — BH Assessment (Signed)
Per Suzanna Obey, RN: "she will speak "sometimes", but it takes a lot of work to coax her. Most of the time she just looks at me."   Clinician asked RN to notify TTS when the pt is able to engage.    Vertell Novak, [place], [facility], [facility] Triage Specialist [phone]

## 2021-02-07 NOTE — ED Notes (Signed)
Patient out of bed and trying to run out of room. Patient repeating "This has nothing to do with it. I have contact lenses." Attempting to redirect patient. Patient redirected into bed.

## 2021-02-07 NOTE — ED Notes (Signed)
Mri will be calling for the pt shortly

## 2021-02-07 NOTE — ED Notes (Addendum)
Pt up to commode with assistance. Pt continual pulling of lines. Pt keeps restating, " I am confused, I am confused" RN continues to reorient. Pt able to answer questions about school etc. Pt to be TTS.

## 2021-02-07 NOTE — ED Notes (Signed)
RN found pt up out of bed again. Pt able to be redirected previously, RN unable to redirect now. Pt keeps saying she, " doesn't want tattoo". Pt placed in restraints. MD paged for order.

## 2021-02-07 NOTE — ED Provider Notes (Signed)
  Physical Exam  BP 106/68   Pulse 71   Temp 98.7 F (37.1 C) (Oral)   Resp 19   LMP 01/11/2021   SpO2 99%   Physical Exam  ED Course/Procedures     Procedures  MDM    20 year old female history of Hashimoto's presents today with a week of feeling ill, nausea vomiting, then decreased responsiveness with decreased verbal interactions. Here in the ED she was initially appeared catatonic.  She has had some verbal interaction since then. Her TSH is elevated at 10. 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 they have advised that she should have an MRI.  Antibodies are pending. Care discussed with Dr. Gilford Rile who will see and evaluate patient     Pattricia Boss, MD 02/07/21 [phone]

## 2021-02-07 NOTE — Evaluation (Signed)
Clinical/Bedside Swallow Evaluation Patient Details  Name: Jamie [name] MRN: [id number] Date of Birth: 09-15-2000  Today's Date: 02/07/2021 Time: SLP Start Time (ACUTE ONLY): 1648 SLP Stop Time (ACUTE ONLY): 1655 SLP Time Calculation (min) (ACUTE ONLY): 7 min  Past Medical History:  Past Medical History:  Diagnosis Date   Acquired autoimmune hypothyroidism    Dx 12/2014, TSH 110, FT4 0.3   Past Surgical History: History reviewed. No pertinent surgical history. HPI:  Jamie [name] is a 20 y.o. female who presented to [facility] with decreased interaction with family, mutism, nausea, and vomiting. Head CT 10/16 negative.  Pt with a pertinent PMH of hypothyroidism    Assessment / Plan / Recommendation  Clinical Impression  Pt presents with functional swallowing as assessed clinically.  Pt tolerated all consistencies trialed with no clinical s/s of aspiration and exhibited good oral clearance of solids.  There was no gagging noted.  Pt does c/o N/V.  Medical team arrived at end of session; notified regarding stomach discomfort.    Recommend regular texture diet with thin liquids.     If pt is not at/does not return to baseline level of function, consider cognitive linguistic evaluation.  SLP Visit Diagnosis: Dysphagia, unspecified (R13.10)    Aspiration Risk  No limitations    Diet Recommendation Regular;Thin liquid   Liquid Administration via: Cup;Straw Medication Administration:  (No specific precuations, as tolerated) Compensations: Slow rate;Small sips/bites Postural Changes: Seated upright at 90 degrees    Other  Recommendations Oral Care Recommendations: Oral care BID    Recommendations for follow up therapy are one component of a multi-disciplinary discharge planning process, led by the attending physician.  Recommendations may be updated based on patient status, additional functional criteria and insurance authorization.  Follow up Recommendations None       Frequency and Duration  (N/A)          Prognosis Prognosis for Safe Diet Advancement:  (N/A)      Swallow Study   General Date of Onset: 02/05/21 HPI: Jamie [name] is a 20 y.o. female who presented to [facility] with decreased interaction with family, mutism, nausea, and vomiting. Head CT 10/16 negative.  Pt with a pertinent PMH of hypothyroidism Type of Study: Bedside Swallow Evaluation Previous Swallow Assessment: None Diet Prior to this Study: NPO Temperature Spikes Noted: No Respiratory Status: Room air History of Recent Intubation: No Behavior/Cognition: Alert;Cooperative;Pleasant mood Oral Cavity Assessment: Within Functional Limits Oral Care Completed by SLP: No Oral Cavity - Dentition: Adequate natural dentition Patient Positioning: Upright in bed Baseline Vocal Quality: Normal    Oral/Motor/Sensory Function Overall Oral Motor/Sensory Function: Within functional limits Facial ROM: Within Functional Limits Facial Symmetry: Within Functional Limits Lingual ROM: Within Functional Limits Lingual Symmetry: Within Functional Limits Lingual Strength: Reduced Velum:  (Could not test) Mandible:  (Reduced)   Ice Chips Ice chips: Not tested   Thin Liquid Thin Liquid: Within functional limits Presentation: Cup;Straw    Nectar Thick Nectar Thick Liquid: Not tested   Honey Thick Honey Thick Liquid: Not tested   Puree Puree: Within functional limits Presentation: Spoon   Solid     Solid: Within functional limits Presentation:  (SLP fed)      Celedonio Savage, [place], [place] Office: [phone] 02/07/2021,5:27 PM

## 2021-02-07 NOTE — Progress Notes (Cosign Needed)
Was paged by RN for patient trying to elope. [name] was seen this PM with family friend at bedside.    Patient presented with acute AMS, concern for acute encephalopathy versus acute first break psychosis.   Patient did try to elope on multiple occasions.  She is in soft restraints currently. Attempted a trial without the restraints, patient tried to elope again.  Patient was A&Ox2, to self and year. Was not able to note on current location and situation. Affect is flat. Speech was largely a verbal with some whispering. Thought content was superficially goal directed, not linear. Patient did not seem internally preoccupied.  During stay in the ED, family friend denied patient entirely responding to stimuli.  Due to her AMS, concern for her safety, we will continue to have her in soft restraints and one-on-one observation.

## 2021-02-07 NOTE — BHH Counselor (Signed)
TTS spoke with Dr. Sedonia Small concerning changing the TTS consult to a psych consult. He agreed. The TTS consult has been removed. The patient will be seen for a psychiatric consult with a provider.

## 2021-02-07 NOTE — BH Assessment (Signed)
Clinician messaged Marquis Buggy, RN: "Hey. It's Trey with TTS. is the pt able to engage in assessment. Also is the pt under IVC?"   Clinician awaiting response.    Vertell Novak, [place], [facility], [facility] Triage Specialist [phone]

## 2021-02-07 NOTE — ED Provider Notes (Signed)
  Provider Note MRN:  [id number]  Arrival date & time: 02/07/21    ED Course and Medical Decision Making  Assumed care from Dr. Sherry Ruffing at shift change.  Speech disturbance, not talking, question catatonia in the setting of new onset psychiatric disturbance versus less likely Hashimoto encephalitis.  Neuro following, recommending MRI.  Despite multiple doses of Ativan, patient not tolerating MRI.  She has since spoken full sentences in the presence of nursing staff.  Discussed this change with neurology, will hold off on MRI and obtain CT scan and await labs.  CT is reassuring.  Awaiting results of antibody tests ordered by neurology.  If reassuring, patient will be medically cleared and awaiting TTS recommendations.  TTS recommending psychiatric consultation.  Procedures  Final Clinical Impressions(s) / ED Diagnoses     ICD-10-CM   1. Other speech disturbance  R47.89     2. Altered mental status, unspecified altered mental status type  R41.82       ED Discharge Orders     None       Discharge Instructions   None     Barth Kirks. Sedonia Small, [place] [email or web address]    Maudie Flakes, MD 02/07/21 [phone]

## 2021-02-07 NOTE — ED Notes (Signed)
To mri 

## 2021-02-08 ENCOUNTER — Inpatient Hospital Stay (HOSPITAL_COMMUNITY): Payer: Medicaid Other

## 2021-02-08 LAB — COMPREHENSIVE METABOLIC PANEL
ALT: 12 U/L (ref 0–44)
AST: 16 U/L (ref 15–41)
Albumin: 3.6 g/dL (ref 3.5–5.0)
Alkaline Phosphatase: 32 U/L — ABNORMAL LOW (ref 38–126)
Anion gap: 5 (ref 5–15)
BUN: 9 mg/dL (ref 6–20)
CO2: 26 mmol/L (ref 22–32)
Calcium: 9.2 mg/dL (ref 8.9–10.3)
Chloride: 103 mmol/L (ref 98–111)
Creatinine, Ser: 0.76 mg/dL (ref 0.44–1.00)
GFR, Estimated: >60 mL/min (ref >60)
Glucose, Bld: 112 mg/dL — ABNORMAL HIGH (ref 70–99)
Potassium: 3.7 mmol/L (ref 3.5–5.1)
Sodium: 134 mmol/L — ABNORMAL LOW (ref 135–145)
Total Bilirubin: 0.6 mg/dL (ref 0.3–1.2)
Total Protein: 7 g/dL (ref 6.5–8.1)

## 2021-02-08 LAB — CBC
HCT: 35.5 % — ABNORMAL LOW (ref 36.0–46.0)
Hemoglobin: 11.7 g/dL — ABNORMAL LOW (ref 12.0–15.0)
MCH: 29.8 pg (ref 26.0–34.0)
MCHC: 33 g/dL (ref 30.0–36.0)
MCV: 90.6 fL (ref 80.0–100.0)
Platelets: 238 10*3/uL (ref 150–400)
RBC: 3.92 MIL/uL (ref 3.87–5.11)
RDW: 12.5 % (ref 11.5–15.5)
WBC: 5 10*3/uL (ref 4.0–10.5)
nRBC: 0 % (ref 0.0–0.2)

## 2021-02-08 LAB — CSF CELL COUNT WITH DIFFERENTIAL
Eosinophils, CSF: 0 % (ref 0–1)
Eosinophils, CSF: 0 % (ref 0–1)
Lymphs, CSF: 92 % — ABNORMAL HIGH (ref 40–80)
Lymphs, CSF: 94 % — ABNORMAL HIGH (ref 40–80)
Monocyte-Macrophage-Spinal Fluid: 6 % — ABNORMAL LOW (ref 15–45)
Monocyte-Macrophage-Spinal Fluid: 8 % — ABNORMAL LOW (ref 15–45)
RBC Count, CSF: 2 /mm3 — ABNORMAL HIGH
RBC Count, CSF: 3 /mm3 — ABNORMAL HIGH
Segmented Neutrophils-CSF: 0 % (ref 0–6)
Segmented Neutrophils-CSF: 0 % (ref 0–6)
Tube #: 1
Tube #: 4
WBC, CSF: 273 /mm3 (ref 0–5)
WBC, CSF: 318 /mm3 (ref 0–5)

## 2021-02-08 LAB — URINE CULTURE

## 2021-02-08 LAB — PROTEIN AND GLUCOSE, CSF
Glucose, CSF: 68 mg/dL (ref 40–70)
Total  Protein, CSF: 50 mg/dL — ABNORMAL HIGH (ref 15–45)

## 2021-02-08 LAB — THYROGLOBULIN ANTIBODY: Thyroglobulin Antibody: 57.6 IU/mL — ABNORMAL HIGH (ref 0.0–0.9)

## 2021-02-08 LAB — T3, FREE: T3, Free: 1.6 pg/mL — ABNORMAL LOW (ref 2.3–5.0)

## 2021-02-08 LAB — THYROID PEROXIDASE ANTIBODY: Thyroperoxidase Ab SerPl-aCnc: 268 IU/mL — ABNORMAL HIGH (ref 0–26)

## 2021-02-08 MED ORDER — OLANZAPINE 2.5 MG PO TABS: 2.5000 mg | ORAL_TABLET | Freq: Two times a day (BID) | ORAL | Status: DC

## 2021-02-08 MED ORDER — LORAZEPAM 1 MG PO TABS: 1.0000 mg | ORAL_TABLET | Freq: Four times a day (QID) | ORAL | Status: DC

## 2021-02-08 MED ORDER — LEVOTHYROXINE SODIUM 100 MCG/5ML IV SOLN
50.0000 ug | Freq: Every day | INTRAVENOUS | Status: DC
Start: 2021-02-11 — End: 2021-02-09

## 2021-02-08 MED ORDER — LORAZEPAM 1 MG PO TABS
1.0000 mg | ORAL_TABLET | Freq: Four times a day (QID) | ORAL | Status: AC
Start: 2021-02-08 — End: 2021-02-09

## 2021-02-08 MED ORDER — LORAZEPAM 2 MG/ML IJ SOLN
1.0000 mg | Freq: Four times a day (QID) | INTRAMUSCULAR | Status: AC
Start: 2021-02-08 — End: 2021-02-09
  Administered 2021-02-08 – 2021-02-09 (×2): 1 mg via INTRAVENOUS
  Filled 2021-02-08 (×3): qty 1

## 2021-02-08 MED ORDER — LORAZEPAM 2 MG/ML IJ SOLN
1.0000 mg | Freq: Once | INTRAMUSCULAR | Status: AC
  Administered 2021-02-08: 1 mg via INTRAVENOUS
  Filled 2021-02-08: qty 1

## 2021-02-08 MED ORDER — LEVOTHYROXINE SODIUM 100 MCG PO TABS
100.0000 ug | ORAL_TABLET | Freq: Every day | ORAL | Status: DC
  Filled 2021-02-08: qty 1

## 2021-02-08 NOTE — Progress Notes (Addendum)
HD 1 SUBJECTIVE:  Patient Summary: [name] is a 20 y.o. female, with a PMHx of Hashimoto thyroiditis, who presented to [facility] (10/15) for strange behavior, isolation, mutism, and N/V, then admitted for Acute Encephalopathy vs catatonia work-up.  Overnight Events: None  Interim History: [name] was seen this AM alert, in 2 point soft restraints, on 1:1 observation. Family friend is at bedside. Patient continues to be non-verbal. She is able to respond to commands such as look towards directed. However, she would not respond to any other commands. Per family friend at bedside, patient mental status waxes and wanes. Where she would be calm then become restless and try to remove her restraints.   OBJECTIVE:  Vital Signs: Vitals:   02/07/21 2006 02/08/21 0000 02/08/21 0800 02/08/21 1125  BP: 111/82 109/79 101/82 108/80  Pulse: (!) 56 (!) 50 75 69  Resp: 18   18  Temp:  97.8 F (36.6 C)    TempSrc:  Oral    SpO2: 100% 100%  100%   Supplemental O2:  Room Air SpO2: 100 % There were no vitals filed for this visit.  Intake/Output Summary (Last 24 hours) at 02/08/2021 1918 Last data filed at 02/08/2021 0400 Gross per 24 hour  Intake 380 ml  Output 500 ml  Net -120 ml   Physical Exam: Physical Exam Vitals and nursing note reviewed.  Constitutional:      Appearance: Normal appearance.  Neurological:     Mental Status: She is alert.   Pertinent Labs: CBC Latest Ref Rng & Units 02/08/2021 02/06/2021 05/23/2019  WBC 4.0 - 10.5 K/uL 5.0 5.1 5.8  Hemoglobin 12.0 - 15.0 g/dL 11.7(L) 12.7 11.8  Hematocrit 36.0 - 46.0 % 35.5(L) 40.0 36.5  Platelets 150 - 400 K/uL 238 260 329   CMP Latest Ref Rng & Units 02/08/2021 02/07/2021 02/06/2021  Glucose 70 - 99 mg/dL 112(H) 102(H) 87  BUN 6 - 20 mg/dL 9 13 20   Creatinine 0.44 - 1.00 mg/dL 0.76 0.76 1.20(H)  Sodium 135 - 145 mmol/L 134(L) 137 137  Potassium 3.5 - 5.1 mmol/L 3.7 3.3(L) 3.8  Chloride 98 - 111 mmol/L  103 106 97(L)  CO2 22 - 32 mmol/L 26 23 22   Calcium 8.9 - 10.3 mg/dL 9.2 8.6(L) 9.5  Total Protein 6.5 - 8.1 g/dL 7.0 6.9 8.3(H)  Total Bilirubin 0.3 - 1.2 mg/dL 0.6 1.3(H) 1.3(H)  Alkaline Phos 38 - 126 U/L 32(L) 31(L) 41  AST 15 - 41 U/L 16 15 23   ALT 0 - 44 U/L 12 13 17    No results for input(s): GLUCAP in the last 72 hours. [facility] Kitchenlastlab Pertinent Imaging: EEG adult  Result Date: 02/08/2021 Lora Havens, MD     02/08/2021  6:24 PM Patient Name: Jamie [name] MRN: [id number] Epilepsy Attending: Lora Havens Referring Physician/Provider: Dr Kerney Elbe Date: 10/17/ 2022 Duration: 23.31 mins Patient history:  20 y.o. female with PMH significant for Hashimoto thyroiditis who presents with vomiting, headache and not eating for 4 days and not talking since today. EEG to evaluate for seizure Level of alertness: Awake AEDs during EEG study: None Technical aspects: This EEG study was done with scalp electrodes positioned according to the 10-20 International system of electrode placement. Electrical activity was acquired at a sampling rate of 500Hz  and reviewed with a high frequency filter of 70Hz  and a low frequency filter of 1Hz . EEG data were recorded continuously and digitally stored. Description: EEG showed continuous generalized 3 to 6 Hz theta-delta  slowing. Hyperventilation and photic stimulation were not performed.   ABNORMALITY - Continuous slow, generalized IMPRESSION: This study is suggestive of moderate diffuse encephalopathy, nonspecific etiology. No seizures or epileptiform discharges were seen throughout the recording. Lora Havens   ASSESSMENT/PLAN:  Assessment: Active Problems:   Altered mental status  [name] is a 20 y.o. female, with a PMHx of Hashimoto thyroiditis, who presented to [facility] (10/15) for strange behavior, isolation, mutism, and N/V, then admitted for Acute Encephalopathy vs catatonia work-up. HD 1  Plan: #Altered Mental  Status: Patient presented to the [facility] ED with AMS, mutism, N/V. Ddx Hashimoto's encephalitis vs catatonia. Psychiatry and neurology consulted, appreciate assistance and recommendations.  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  LP with CSF studies and EEG per neurology, pending workup.  We will consider MRI with and without contrast. Urine culture resulted in multiple species present, suggest recollection.  - Hashimoto's encephalitis work-up per neurology, appreciate recommendations and assistance - Ativan trial for catatonia per psychiatry, appreciate recommendations and assistance - Trend BMP electrolyte abnormalities   #Hypothyroidism: Patient diagnosed with hypothyroidism secondary to [place] thyroiditis in 2016.  On chart review, they have been concerned the patient has been intermittently taking her medications.  Chart review shows that there has been ongoing fights about patient's Synthroid compliance.  Unable to obtain collateral from mother, as she is not in the country.  Per family friend, she will be back on 10/19.  Attempted to reach patient's grandmother, left a hip compliant with no. - Continue Synthroid 100 MCG daily   #Prolonged QTc QTc 528 on 10/15.  We will hold on SGA's and any other prolonging agents. - Follow-up repeat EKG -Trend BMP for electrolyte derangement  #Anxiety/Depression #Home Stressors: Patient had several [place] interviews over the past several years.  Her last appointment was in February 2021, at that time her GAD-7 score was 16, and PHQ 15 score was 7, which indicates a high anxiety level and low somatization level.  Additionally at home, evident her mother have disagreements over diet, her medication, education, and having a sibling with an intellectual disability and another with depression.  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  She is not  on any current medications for anxiety.  At bedside, her aunt notes that the patient is worried about her future endeavors, but does not want to be labeled as having "mental issues." given her symptoms of altered mental status today, with concern for a psychiatric component, psychiatry was consulted in the ED. - Appreciate psychiatry's recommendations   #Hx of Vitamin D Insufficiency: Patient with a history of vitamin D deficiency vitamin D 25-hydroxy level of 29 4 years ago. Initially appears to have been related to diet for which the patient followed with a dietitian, but does not appear to have been reevaluated. Repeat shows level of 29, essentially unchanged. We will consider vitamin D supplement.  Best Practice: Diet: Normal Bowel: None Pain: Tylenol 650mg  PRN IVF:   None,None Abx: None VTE: Place and maintain sequential compression device Start: 02/08/21 0847  Code: Full Code  PT/OT recs: Pending, none. TOC recs: Pending  Prior to Admission Living Arrangement: Home  Anticipated Discharge Location: Pending  Barriers to Discharge: Medical management  Dispo: Anticipated discharge in 1-2 days.  Signature: Merrily Brittle, DO Psychiatry Resident, PGY-1 [facility] Pager: [phone] 7:18 PM, 02/08/2021  Please contact the on call  pager after 5 pm and on weekends at [phone].

## 2021-02-08 NOTE — Progress Notes (Signed)
EEG completed, results pending. 

## 2021-02-08 NOTE — Consult Note (Signed)
Patient is nonverbal and mute at this time. She is able to track movement with her eyes. She makes no attempts to communicate, and she remains in 2 point restraints at this time. She Is noted to have a family member at the bedside and a 1:1 Air cabin crew. Per safety sitter patient has not spoken since this morning.  Nursing documentation was last noted on 10/16 @641  pm " Patient received to the floor, VSS, A and Oriented to self, skin is intact, no acute distress noticed, patient is 20 years old, no family members at the moment, she came with her neighbor which been told by RN to contact her family in order to take the medical history and do the MRI consent over the phone since her family traveled to [place]."  Family member spoke with mother who denies any psychiatric  history in the family. Mother also reports she will return to the [place] on Wednesday.   Patient is unable to participate in Psychiatric speciality exam and/or psychiatric evaluation. Will treat symptoms at this time to include suspected mutism/catatonia and or psychosis.   -Will start ativan 1mg  po q6 hr x 4 doses.  -WIll start Zyprexa zydis 2.5mg  po BID for psychosis.  -Continue working closely with neurology for further work-up to include Lumbar puncture, and EEG. If nuero work up is negative consider CT abd/pelvis.  -Spoke with lead staff (NT +3 ) regarding lack of nursing documentation on this patient since she was admitted to the unit from the ED. Nursing documentation is vital to ensure patient is receiving optimal and effective care.  -COntinue 1:1 Air cabin crew, continue working to trial off of restraints when able to do so.

## 2021-02-08 NOTE — Plan of Care (Incomplete)
°  Problem: Safety: Goal: Non-violent Restraint(s) Outcome: Progressing   Problem: Education: Goal: Knowledge of General Education information will improve Description: Including pain rating scale, medication(s)/side effects and non-pharmacologic comfort measures Outcome: Progressing   Problem: Health Behavior/Discharge Planning: Goal: Ability to manage health-related needs will improve Outcome: Progressing   Problem: Clinical Measurements: Goal: Ability to maintain clinical measurements within normal limits will improve Outcome: Progressing Goal: Will remain free from infection Outcome: Progressing Goal: Diagnostic test results will improve Outcome: Progressing Goal: Respiratory complications will improve Outcome: Progressing Goal: Cardiovascular complication will be avoided Outcome: Progressing   Problem: Activity: Goal: Risk for activity intolerance will decrease Outcome: Progressing   Problem: Nutrition: Goal: Adequate nutrition will be maintained Outcome: Progressing   Problem: Coping: Goal: Level of anxiety will decrease Outcome: Progressing   Problem: Pain Managment: Goal: General experience of comfort will improve Outcome: Progressing   Problem: Safety: Goal: Ability to remain free from injury will improve Outcome: Progressing   Problem: Skin Integrity: Goal: Risk for impaired skin integrity will decrease Outcome: Progressing

## 2021-02-08 NOTE — Procedures (Signed)
Patient Name: Jamie [name]  MRN: [id number]  Epilepsy Attending: Lora Havens  Referring Physician/Provider: Dr Kerney Elbe Date: 10/17/ 2022 Duration: 23.31 mins  Patient history:  20 y.o. female with PMH significant for Hashimoto thyroiditis who presents with vomiting, headache and not eating for 4 days and not talking since today. EEG to evaluate for seizure  Level of alertness: Awake  AEDs during EEG study: None  Technical aspects: This EEG study was done with scalp electrodes positioned according to the 10-20 International system of electrode placement. Electrical activity was acquired at a sampling rate of 500Hz  and reviewed with a high frequency filter of 70Hz  and a low frequency filter of 1Hz . EEG data were recorded continuously and digitally stored.   Description: EEG showed continuous generalized 3 to 6 Hz theta-delta slowing. Hyperventilation and photic stimulation were not performed.     ABNORMALITY - Continuous slow, generalized  IMPRESSION: This study is suggestive of moderate diffuse encephalopathy, nonspecific etiology. No seizures or epileptiform discharges were seen throughout the recording.  Jamie [name] Jamie [name]

## 2021-02-08 NOTE — Progress Notes (Addendum)
Neurology Progress Note  Brief HPI: 20 y.o. 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 intake for 4 days and decreased verbalization 10/15 prior to arrival.  Initial work-up revealed a mildly elevated creatinine, TSH of 10.09, negative ETOH, and an abnormally elevated thyroperoxidase Ab of 268.   Subjective: No acute overnight events noted Patient attempted to elope multiple times throughout the night while remaining encephalopathic and subsequently required soft wrist restraint application  Exam: Vitals:   02/08/21 0800 02/08/21 1125  BP: 101/82 108/80  Pulse: 75 69  Resp:  18  Temp:    SpO2:  100%   Gen: Sitting up in bed with bilateral wrist restraints, in no acute distress, friend at bedside Psych: She has a flat affect but remains calm throughout assessment Resp: non-labored breathing, no respiratory distress on room air Abd: soft, non-tender, non-distended  Neuro: Mental Status: Awake, alert, she has minimal speech output. She speaks in a barely audible tone and states her first name, does not answer her last name, she states the year as "tw[age]", when asked the month she states "it's, it's". When asked where we are she states "building". She does perseverate throughout assessment. She states incorrectly that she is 20 years old. Speech is non-fluent, hypophonic, and with delayed responses. She follows simple commands with repeat instructions.  She is unable to name objects, she states that examiner's watch as "green" and does not attempt to further identify objects or repeat phrases.  Cranial Nerves: PERRL, EOMI without ptosis, visual fields are full, face is symmetric resting and with minimal movement, facial sensation is intact and symmetric to light touch, hearing is intact to voice, shoulders shrug symmetrically, tongue protrudes minimally at midline.  Motor: Bradykinetic movements noted throughout.  Formal strength assessment is limited due to patient cooperation.  Bilateral upper and lower extremities elevate antigravity without vertical drift without noted asymmetry. Grip strength is 4/5 bilaterally.  Tone and bulk are normal.  Sensory: Sensation to light touch is reported to be intact and symmetric throughout. DTR: Brisk 3+ and symmetric biceps and patellae, 3-4 beats of clonus with right ankle dorsiflexion, 5-6 beats of clonus present with left ankle dorsiflexion. Hoffman's sign is equivocal bilaterally. Toes downgoing.  Gait: Deferred  Pertinent Labs: CBC    Component Value Date/Time   WBC 5.0 02/08/2021 0654   RBC 3.92 02/08/2021 0654   HGB 11.7 (L) 02/08/2021 0654   HCT 35.5 (L) 02/08/2021 0654   PLT 238 02/08/2021 0654   MCV 90.6 02/08/2021 0654   MCH 29.8 02/08/2021 0654   MCHC 33.0 02/08/2021 0654   RDW 12.5 02/08/2021 0654   LYMPHSABS 1.1 02/06/2021 1435   MONOABS 0.3 02/06/2021 1435   EOSABS 0.0 02/06/2021 1435   BASOSABS 0.0 02/06/2021 1435   CMP     Component Value Date/Time   NA 134 (L) 02/08/2021 0654   K 3.7 02/08/2021 0654   CL 103 02/08/2021 0654   CO2 26 02/08/2021 0654   GLUCOSE 112 (H) 02/08/2021 0654   BUN 9 02/08/2021 0654   CREATININE 0.76 02/08/2021 0654   CALCIUM 9.2 02/08/2021 0654   PROT 7.0 02/08/2021 0654   ALBUMIN 3.6 02/08/2021 0654   AST 16 02/08/2021 0654   ALT 12 02/08/2021 0654   ALKPHOS 32 (L) 02/08/2021 0654   BILITOT 0.6 02/08/2021 0654   GFRNONAA >60 02/08/2021 0654   Lab Results  Component Value Date   VITAMINB12 692 02/07/2021  Urinalysis    Component Value Date/Time   COLORURINE YELLOW 02/07/2021 0600   APPEARANCEUR HAZY (A) 02/07/2021 0600   LABSPEC 1.017 02/07/2021 0600   PHURINE 5.0 02/07/2021 0600   GLUCOSEU NEGATIVE 02/07/2021 0600   HGBUR MODERATE (A) 02/07/2021 0600   BILIRUBINUR NEGATIVE 02/07/2021 0600   KETONESUR 80 (A) 02/07/2021 0600   PROTEINUR NEGATIVE 02/07/2021 0600   UROBILINOGEN 0.2  02/05/2021 1459   NITRITE NEGATIVE 02/07/2021 0600   LEUKOCYTESUR NEGATIVE 02/07/2021 0600   Drugs of Abuse     Component Value Date/Time   LABOPIA NONE DETECTED 02/07/2021 0600   COCAINSCRNUR NONE DETECTED 02/07/2021 0600   LABBENZ NONE DETECTED 02/07/2021 0600   AMPHETMU NONE DETECTED 02/07/2021 0600   THCU NONE DETECTED 02/07/2021 0600   LABBARB NONE DETECTED 02/07/2021 0600    Alcohol Level    Component Value Date/Time   ETH <10 02/06/2021 1435    Ref. Range 02/06/2021 17:47 02/07/2021 04:08 02/07/2021 04:43  TSH Latest Ref Range: 0.350 - 4.500 uIU/mL 10.095 (H)    Triiodothyronine,Free,Serum Latest Ref Range: 2.3 - 5.0 pg/mL 1.6 (L)    T4,Free(Direct) Latest Ref Range: 0.61 - 1.12 ng/dL 0.74    Thyroperoxidase Ab SerPl-aCnc Latest Ref Range: 0 - 26 IU/mL   268 (H)  Erythrocyte Sedimentation Rate     Component Value Date/Time   ESRSEDRATE 15 02/07/2021 0443   Lab Results  Component Value Date   CRP 0.7 02/07/2021   Imaging Reviewed:  CT head wo contrast 10/16: Negative head CT  Assessment: 20 y.o. female with PMH significant for Hashimoto thyroiditis who presented to the ED 10/15 with vomiting, headache, and decreased PO intake for 4 days with decreased verbalization starting on 10/15.  - Examination reveals patient that is acutely encephalopathic with decreased verbalization who is not oriented and requires constant prompting for command following. She does not have any noted focal weakness on examination.  - Presentation is concerning for psychiatric presentation versus Hashimoto's encephalitis given her history of Hashimoto thyroiditis. Current labs reveal elevated TSH and elevated TPO Ab. Her cognitive presentation also is compatible with Hashimoto's encephalitis  and headache can co-occur as well. However, she does not have clinically evident seizures or myoclonus.  - Will pursue LP for further evaluation at this time. After full discussion of risks/benefits, consent  for LP was obtained from family over the telephone by Anibal Henderson, LP. Witnessed consent form placed in paper chart.  - Given her poor participation with exam, aphemia secondary to a left precentral gyrus stroke is a possibility. Will get an MRI Brain with and without contrast to evaluate. MRI also may show lesional hyperintensity in Hashimoto's encephalopathy.  - Per UpToDate: "Nonspecific electroencephalographic (EEG) abnormalities are seen in 90 to 98 percent of patients, usually demonstrating nonspecific slowing of background activity. Focal spikes or sharp waves and transient epileptic activity are less common. Triphasic waves and frontal intermittent rhythmic delta activity (FIRDA) have also been described." - Of note, approximately 80% of Hashimoto encephalopathy patients have an abnormal LP.   Recommendations: - MRI brain with and without contrast pending - LP with CSF studies including: cell count, differential, gram stain and culture, fungal culture, glucose, protein, OCBs and IgG index  - EEG (ordered)  Anibal Henderson, AGACNP-BC [facility] [phone]  Electronically signed: Dr. Kerney Elbe

## 2021-02-08 NOTE — Hospital Course (Addendum)
Hospital Course by problem list: [name] is a 20 y.o. female, with a PMHx of Hashimoto thyroiditis and resulting hypothyroidism, who presented to [facility] (10/15) for strange behavior, isolation, mutism, and N/V, then admitted for Acute Encephalopathy work-up. LOS: 8 days  ED Course: Patient was not verbally interactive with staff. However was able to state her name and spoke in brief about her dog. Stated that she felt confused. Patient's beta hCG is unremarkable, CBC was WNL, EtOH negative.  CMP showed a creatinine of 1.2, with AG of 18, mildly hypochloremic (chloride 97).  TSH was elevated at 10.095, free T4 0.74. CRP and Sed rate were WNL.   Of note, patient presented to ED for intractable migraine with nausea and vomiting for 4 days.  During hospitalization, patient was worked up for acute encephalopathy. 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 She was started on 5 days of IV methylprednisolone and 7 days IVIG. An ativan trial for symptomatic management of catatonia, however no effect and was discontinued.   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    10/14 ED for intractable migraine w n/v when trying to eat since 10/10  10/17 Admission & LP 10/18 Trial w/o restraint & Solumedrol 1061m (macules noted prior starting IV sterioid) 10/20 Met mother and uncle at bedside; [facility] management of catatonia, CT CAP w CON [facility] witnessed seizure x2 (generalized, focal and LE twitching)  Consults: Psychiatry, Neurology, ID, PCCM  Plan:  #Acute Encephalopathy Initial head CT showed no structural abnormalities. Attempted head MRI, however study was not completed due to patient's movement. Ddx autoimmune versus paraneoplastic versus viral encephalitis.  Folate and vitamin B12 were within normal limits.   Bacterial cause R/O.  Spinal tap studies revealed CSF is clear in color, WBCs with  lymphocytic predominance, elevated proteins. No organisms with stain. Consistent for autoimmune vs viral vs paraneoplastic etiology.  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 There was elevated TSH, TPO Ab, and thyroglobulin Ab are consistent with Hashimoto's encephalitis.  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  Will rule out or rule in ovarian teratoma with vaginal ultrasound.    EEG findings were consistent with unspecified encephalopathy. No evidence of seizures, epileptiform discharges, or typical waveforms consistent with HSV encephalitis.  MRI limited by consent from mother [place] XR were ordered to clear MRI.  Continue 1:1 observation with sitter Trend BMP electrolyte abnormalities Hashimoto's encephalitis work-up per neurology, appreciate recommendations and assistance Trial of IV solumedrol 1004mper neurology Psychogenic etiology R/O per psychiatry, appreciate recommendations and assitance F/u   Discussed at length with neurology today. CSF studies consistent with autoimmune vs viral etiology, viral less likely given EEG findings and known history of autoimmune disorder that may cause encephalitis although not entirely able to rule out. 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 The treatment is steroids. Of note, TPO Abs were very elevated (>900) at diagnosis in 2016. Also considering other autoimmune etiologies such as NMDA-receptor encephalitis.  #Seizure Witnessed seizure x2 (10/24).  ABNORMALITY - Generalized rhythmic delta activity ( GRDA)   IMPRESSION: This study showed generalized rhythmic delta activity which is on the ictal-interictal continuum with low potential for seizures. There is also severe  diffuse  encephalopathy, nonspecific etiology. No seizures or definite epileptiform discharges were seen throughout the recording.   If concern for ictal-interictal activity persists, please consider long term monitoring.   #Urinary Retention While patient periodically voided large amounts on her own, 2+ bladder scans with over 200cc's of urine. Foley catheter placed overnight.  -Foley placed day 1 (start 10/23)  #Malnutrition Secondary to encephalopathy and lack of p.o. intake.  Core track tube placed 02/12/21.  Magnesium and phosphate stable.  Continue tube feeds and to trend electrolytes. -Appreciate dietitian assistance recommendation. -thiamine daily -free water and feeding supplement daily -Feeding supplement per dietitian   #Hypothyroidism: Patient diagnosed with hypothyroidism secondary to [place] thyroiditis in 2016.  On chart review, they have been concerned the patient has been intermittently taking her medications.  Mother states that Jamie [name] has not been taking her medications, patient states that she takes her Synthroid at night. TSH is elevated today at 10, free T4 is within normal limits.  Current home regimen is Synthroid 100 MCG daily. - Continue Synthroid 100 MCG daily - Follow-up T3  2/2 Hashimoto thyroiditis (2016). Dx was prompted by amenorrhea. Was followed by ped endocrinologist, and recently became established with adult endocrinologist. Synthroid 100 MCG PO daily at home. Ongoing concern for med compliance vs familial discord per chart review.  Patient is self-NPO, will convert PO synthroid to IV equivalence per pharmacy, appreciate assistance. Collateral limitations per above.  Continue IV Synthroid 50 MCG daily   #Anxiety/Depression #Home Stressors: Patient had several [place] interviews over the past several years.  Her last appointment was in February 2021, at that time her GAD-7 score was 16, and PHQ 15 score was 7, which indicates a high anxiety level and low  somatization level.  Additionally at home, evident her mother have disagreements over diet, her medication, education, and having a sibling with an intellectual disability and another with depression.  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  She is not on any current medications for anxiety.  At bedside, her aunt notes that the patient is worried about her future endeavors, but does not want to be labeled as having "mental issues." given her symptoms of altered mental status today, with concern for a psychiatric component, psychiatry was consulted in the ED. - Appreciate psychiatry's recommendations   #Elevated Creatinine, resolved #AGMA: Patient presents with decreased p.o. intake, nausea, and vomiting over the past 4 days. Her creatinine is 1.20 with an unknown baseline.  She additionally has anion gap metabolic acidosis likely secondary to decreased p.o. intake.  Further supported by her urinalysis which showed ketones of 40 2 days ago and 80 today.  She has received 2 L of 0.9% normal saline in the ED.   - Trend BMP    #Hx of Vitamin D Insufficiency: Patient with a history of vitamin D deficiency, vitamin D 25-hydroxy level 29, 4 years ago. Initially appears to have been related to diet for which the patient followed with a dietitian, but does not appear to have been reevaluated. Repeat shows level of 29, essentially unchanged. We will consider vitamin D supplement.

## 2021-02-08 NOTE — Consult Note (Signed)
[facility] Face-to-Face Psychiatry Consult   Reason for Consult: Concern for catatonia Referring Physician:  Gerlene Fee, MD Patient Identification: Jamie [name] MRN:  [id number] Principal Diagnosis: <principal problem not specified> Diagnosis:  Active Problems:   Altered mental status   Assessment  Jamie [name] is a 20 y.o. female admitted medically for acute altered mental status with mutism with recent history of nausea and vomiting.  02/06/2021  2:17 PM patient carries no known past psychiatric history.  Patient carries known past medical history of Hashimoto's thyroiditis.  Psychiatry was consulted for acute change in altered mental status with concern for catatonia.  Patient was prescribed Synthroid outpatient; however, per EMR patient's mother endorsed patient had not been compliant.  On initial examination, patient is only able to answer very few questions with words and mostly responds with a high-pitched "hmm" as if she does not know what was asked. We plan to do an Ativan trial for possible catatonia as well as start low-dose antipsychotics to help with severe agitation that has been noted during hospitalization.  Patient had a family friend in room who endorsed the patient's mental status appears to wax and wane.  Increasing concern for delirium related to medical illness.  However, patient was able to endorse being in pain in her upper extremities.  We will attempt chemical restraints to help decrease frequency of severe agitation outbursts leading to physical restraints.  Labs reviewed: UA-WNL W/small bilirubin, 40 ketones and trace Hgb; urine pregnancy-negative; CMP-WNL W/chloride 97, creatinine 1.2, T protein 8.3; EtOH-negative, CBC-WNL, TSH-10.095, T3-1 0.6, T4-WNL, ESR-WNL, CRP-WNL, TPO-268, thyroglobulin antibody-57.6, UDS-negative, folate-21.4, B12-WNL, acetaminophen-negative, CMP-WNL W/exception potassium 3.3 and glucose 102, calcium 8.6 and albumin 3.4T bili 1.3 and alk  phos 31: HIV-negative, vitamin D-29, mag-WNL, Phos-2.3 EKG-QTC 528 LP-02/08/2021-pending  Plan Concern for catatonia (r/o autoimmune encephalitis, r/o MDD) -Autoimmune encephalitis work-up per neurology - Ativan 1 mg every 6 hours x4 doses, Ativan trial for catatonia - Hold Zyprexa 2.5 mg twice daily due to prolonged QTC, for severe agitation - Recommend new EKG  Thank you for this consult.  Psychiatry will continue to follow.   Total Time spent with patient: 30 minutes   Subjective:   Jamie [name] is a 20 y.o. [place]-American female patient admitted with acute altered mental status and recent history of nausea and vomiting.  Patient has no known psychiatric history but does have a history of Hashimoto's thyroiditis.  HPI: On assessment today patient is not able to give much history.  Patient is able to give her first name unable to give her last name.  During assessment patient is also able to give the year.  Provider used a motion cards located above patient's bed to ask patient how she felt.  Patient was able to point to "in pain."  Patient was able to say that she was in pain "everywhere."  Provider palpated patient's leg and patient denied pain in her leg.  Patient endorsed pain in her upper extremities and said "a little" when provider touched her head.  Patient had a family friend, Yemershcra, sitting at the bedside.  Family friend reports that the patient was brought in by another church member Dr.Zerihun Assefa.  She reports that she is only aware that the patient has not talked in approximately 5 days and was not eating much last week and had been vomiting and having nausea.  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  This  family friend reports that she is a church friend of patient's mother.  Patient's mother is currently in [place] visiting.  Family friend reports that they are taking turns watching  patient.  Family friend reports that the person who watched patient overnight told her that she did not see the patient sleeping.  And family friend reports that she has not seen patient sleep as well.  Family friend reports that the patient "talks on and off and will drink some and sometimes respond to when I help her."  Family friend attempted to speak with patient in [place], 1 of patient's mother's native languages.  Patient did not respond to any of the questions in this language.   Past Psychiatric History: None  Risk to Self:   Risk to Others:   Prior Inpatient Therapy:   Prior Outpatient Therapy:    Past Medical History:  Past Medical History:  Diagnosis Date  . Acquired autoimmune hypothyroidism    Dx 12/2014, TSH 110, FT4 0.3   History reviewed. No pertinent surgical history. Family History:  Family History  Problem Relation Age of Onset  . Healthy Mother   . Healthy Father    Family Psychiatric  History: None known Social History:  Social History   Substance and Sexual Activity  Alcohol Use Not Currently     Social History   Substance and Sexual Activity  Drug Use Not Currently    Social History   Socioeconomic History  . Marital status: Single    Spouse name: Not on file  . Number of children: Not on file  . Years of education: Not on file  . Highest education level: Not on file  Occupational History  . Not on file  Tobacco Use  . Smoking status: Never  . Smokeless tobacco: Never  Substance and Sexual Activity  . Alcohol use: Not Currently  . Drug use: Not Currently  . Sexual activity: Not on file  Other Topics Concern  . Not on file  Social History Narrative   Lives at home with mom and maternal grandmother and two siblings attends [employer] is in the 8th grade.    Social Determinants of Health   Financial Resource Strain: Not on file  Food Insecurity: Not on file  Transportation Needs: Not on file  Physical Activity: Not on file   Stress: Not on file  Social Connections: Not on file   Additional Social History:    Allergies:  No Known Allergies  Labs:  Results for orders placed or performed during the hospital encounter of 02/06/21 (from the past 48 hour(s))  TSH     Status: Abnormal   Collection Time: 02/06/21  5:47 PM  Result Value Ref Range   TSH 10.095 (H) 0.350 - 4.500 uIU/mL    Comment: Performed by a 3rd Generation assay with a functional sensitivity of <=0.01 uIU/mL. Performed at [facility], [place] [address] [place], [place] [address]   T3, free     Status: Abnormal   Collection Time: 02/06/21  5:47 PM  Result Value Ref Range   T3, Free 1.6 (L) 2.3 - 5.0 pg/mL    Comment: (NOTE) Performed At: [facility] [place], [place] [id number] Rush Farmer MD [id number]   T4, free     Status: None   Collection Time: 02/06/21  5:47 PM  Result Value Ref Range   Free T4 0.74 0.61 - 1.12 ng/dL    Comment: (NOTE) Biotin ingestion may interfere with  free T4 tests. If the results are inconsistent with the TSH level, previous test results, or the clinical presentation, then consider biotin interference. If needed, order repeat testing after stopping biotin. Performed at [facility], [place] [address], [place], [place] [address]   Sedimentation rate     Status: None   Collection Time: 02/07/21  4:43 AM  Result Value Ref Range   Sed Rate 15 0 - 22 mm/hr    Comment: Performed at [facility] [address], [place], [place] [address]  C-reactive protein     Status: None   Collection Time: 02/07/21  4:43 AM  Result Value Ref Range   CRP 0.7 <1.0 mg/dL    Comment: Performed at [facility] [address] [place], [place] [address]  Thyroid peroxidase antibody     Status: Abnormal   Collection Time: 02/07/21  4:43 AM  Result Value Ref Range   Thyroperoxidase Ab SerPl-aCnc 268 (H) 0 - 26 IU/mL    Comment: (NOTE) Performed At: [facility] [place], [place] [id number] Rush Farmer MD IO:[id number]   Thyroglobulin antibody     Status: Abnormal   Collection Time: 02/07/21  4:43 AM  Result Value Ref Range   Thyroglobulin Antibody 57.6 (H) 0.0 - 0.9 IU/mL    Comment: (NOTE) Thyroglobulin Antibody measured by [facility] Methodology Performed At: [facility] [place], [place] [id number] Rush Farmer MD [id number]   Urine rapid drug screen (hosp performed)     Status: None   Collection Time: 02/07/21  6:00 AM  Result Value Ref Range   Opiates NONE DETECTED NONE DETECTED   Cocaine NONE DETECTED NONE DETECTED   Benzodiazepines NONE DETECTED NONE DETECTED   Amphetamines NONE DETECTED NONE DETECTED   Tetrahydrocannabinol NONE DETECTED NONE DETECTED   Barbiturates NONE DETECTED NONE DETECTED    Comment: (NOTE) DRUG SCREEN FOR MEDICAL PURPOSES ONLY.  IF CONFIRMATION IS NEEDED FOR ANY PURPOSE, NOTIFY LAB WITHIN 5 DAYS.  LOWEST DETECTABLE LIMITS FOR URINE DRUG SCREEN Drug Class                     Cutoff (ng/mL) Amphetamine and metabolites    1000 Barbiturate and metabolites    200 Benzodiazepine                 595 Tricyclics and metabolites     300 Opiates and metabolites        300 Cocaine and metabolites        300 THC                            50 Performed at [facility], [place] [address] [place], [place] [address]   Urinalysis, Routine w reflex microscopic     Status: Abnormal   Collection Time: 02/07/21  6:00 AM  Result Value Ref Range   Color, Urine YELLOW YELLOW   APPearance HAZY (A) CLEAR   Specific Gravity, Urine 1.017 1.005 - 1.030   pH 5.0 5.0 - 8.0   Glucose, UA NEGATIVE NEGATIVE mg/dL   Hgb urine dipstick MODERATE (A) NEGATIVE   Bilirubin Urine NEGATIVE NEGATIVE   Ketones, ur 80 (A) NEGATIVE mg/dL   Protein, ur NEGATIVE NEGATIVE mg/dL   Nitrite NEGATIVE NEGATIVE   Leukocytes,Ua NEGATIVE NEGATIVE   RBC / HPF 0-5 0 - 5 RBC/hpf   WBC, UA 6-10 0 - 5  WBC/hpf   Bacteria, UA FEW (A) NONE SEEN   Squamous Epithelial / LPF 0-5 0 - 5   Mucus PRESENT     Comment: Performed at [facility], [place] [address], [place], [place] [address]  Urine Culture     Status: Abnormal   Collection Time: 02/07/21  6:08 AM   Specimen: Urine, Clean Catch  Result Value Ref Range   Specimen Description URINE, CLEAN CATCH    Special Requests      NONE Performed at [facility], [place] [address], [place], [place] [address]    Culture MULTIPLE SPECIES PRESENT, SUGGEST RECOLLECTION (A)    Report Status 02/08/2021 FINAL   Folate, serum, performed at [facility] lab     Status: None   Collection Time: 02/07/21 10:06 AM  Result Value Ref Range   Folate 21.4 >5.9 ng/mL    Comment: Performed at [facility], [place] [address], [place], [place] [address]  Vitamin B12     Status: None   Collection Time: 02/07/21 10:06 AM  Result Value Ref Range   Vitamin B-12 692 180 - 914 pg/mL    Comment: (NOTE) This assay is not validated for testing neonatal or myeloproliferative syndrome specimens for Vitamin B12 levels. Performed at [facility], [place] [address], [place], [place] [address]   Acetaminophen level     Status: Abnormal   Collection Time: 02/07/21 10:06 AM  Result Value Ref Range   Acetaminophen (Tylenol), Serum <10 (L) 10 - 30 ug/mL    Comment: (NOTE) Therapeutic concentrations vary significantly. A range of 10-30 ug/mL  may be an effective concentration for many patients. However, some  are best treated at concentrations outside of this range. Acetaminophen concentrations >150 ug/mL at 4 hours after ingestion  and >50 ug/mL at 12 hours after ingestion are often associated with  toxic reactions.  Performed at [facility], [place] [address], [place], [place] [address]   Comprehensive metabolic panel     Status: Abnormal   Collection Time: 02/07/21 10:06 AM  Result Value Ref Range   Sodium 137 135 - 145 mmol/L   Potassium 3.3 (L)  3.5 - 5.1 mmol/L   Chloride 106 98 - 111 mmol/L   CO2 23 22 - 32 mmol/L   Glucose, Bld 102 (H) 70 - 99 mg/dL    Comment: Glucose reference range applies only to samples taken after fasting for at least 8 hours.   BUN 13 6 - 20 mg/dL   Creatinine, Ser 0.76 0.44 - 1.00 mg/dL   Calcium 8.6 (L) 8.9 - 10.3 mg/dL   Total Protein 6.9 6.5 - 8.1 g/dL   Albumin 3.4 (L) 3.5 - 5.0 g/dL   AST 15 15 - 41 U/L   ALT 13 0 - 44 U/L   Alkaline Phosphatase 31 (L) 38 - 126 U/L   Total Bilirubin 1.3 (H) 0.3 - 1.2 mg/dL   GFR, Estimated >60 >60 mL/min    Comment: (NOTE) Calculated using the CKD-EPI Creatinine Equation (2021)    Anion gap 8 5 - 15    Comment: Performed at [facility], [place] [address] [place], [place] [address]  HIV Antibody (routine testing w rflx)     Status: None   Collection Time: 02/07/21 10:06 AM  Result Value Ref Range   HIV Screen 4th Generation wRfx Non Reactive Non Reactive    Comment: Performed at [facility], [place],  [place] [address]  Resp Panel by RT-PCR (Flu A&B, Covid) Nasopharyngeal Swab     Status: None   Collection Time: 02/07/21 10:12 AM   Specimen: Nasopharyngeal Swab; Nasopharyngeal(NP) swabs in vial transport medium  Result Value Ref Range   SARS Coronavirus 2 by RT PCR NEGATIVE NEGATIVE    Comment: (NOTE) SARS-CoV-2 target nucleic acids are NOT DETECTED.  The SARS-CoV-2 RNA is generally detectable in upper respiratory specimens during the acute phase of infection. The lowest concentration of SARS-CoV-2 viral copies this assay can detect is 138 copies/mL. A negative result does not preclude SARS-Cov-2 infection and should not be used as the sole basis for treatment or other patient management decisions. A negative result may occur with  improper specimen collection/handling, submission of specimen other than nasopharyngeal swab, presence of viral mutation(s) within the areas targeted by this assay, and inadequate number of  viral copies(<138 copies/mL). A negative result must be combined with clinical observations, patient history, and epidemiological information. The expected result is Negative.  Fact Sheet for Patients:  EntrepreneurPulse.com.au  Fact Sheet for Healthcare Providers:  IncredibleEmployment.be  This test is no t yet approved or cleared by the [place] FDA and  has been authorized for detection and/or diagnosis of SARS-CoV-2 by FDA under an Emergency Use Authorization (EUA). This EUA will remain  in effect (meaning this test can be used) for the duration of the COVID-19 declaration under Section 564(b)(1) of the Act, 21 U.S.C.section 360bbb-3(b)(1), unless the authorization is terminated  or revoked sooner.       Influenza A by PCR NEGATIVE NEGATIVE   Influenza B by PCR NEGATIVE NEGATIVE    Comment: (NOTE) The Xpert Xpress SARS-CoV-2/FLU/RSV plus assay is intended as an aid in the diagnosis of influenza from Nasopharyngeal swab specimens and should not be used as a sole basis for treatment. Nasal washings and aspirates are unacceptable for Xpert Xpress SARS-CoV-2/FLU/RSV testing.  Fact Sheet for Patients: EntrepreneurPulse.com.au  Fact Sheet for Healthcare Providers: IncredibleEmployment.be  This test is not yet approved or cleared by the [place] FDA and has been authorized for detection and/or diagnosis of SARS-CoV-2 by FDA under an Emergency Use Authorization (EUA). This EUA will remain in effect (meaning this test can be used) for the duration of the COVID-19 declaration under Section 564(b)(1) of the Act, 21 U.S.C. section 360bbb-3(b)(1), unless the authorization is terminated or revoked.  Performed at [facility], [place] [address], [place], [place] [address]   VITAMIN D 25 Hydroxy (Vit-D Deficiency, Fractures)     Status: Abnormal   Collection Time: 02/07/21 12:00 PM  Result Value  Ref Range   Vit D, 25-Hydroxy 29.07 (L) 30 - 100 ng/mL    Comment: (NOTE) Vitamin D deficiency has been defined by the Institute of Medicine  and an Endocrine Society practice guideline as a level of serum 25-OH  vitamin D less than 20 ng/mL (1,2). The Endocrine Society went on to  further define vitamin D insufficiency as a level between 21 and 29  ng/mL (2).  1. IOM (Institute of Medicine). 2010. Dietary reference intakes for  calcium and D. [place]: The [employer]. 2. Holick MF, [place], Bischoff-Ferrari HA, et al. Evaluation,  treatment, and prevention of vitamin D deficiency: an Endocrine  Society clinical practice guideline, JCEM. 2011 Jul; 96(7): 1911-30.  Performed at [facility], [place] [address], [place], [place] [address]   Magnesium     Status: None   Collection Time: 02/07/21 12:00  PM  Result Value Ref Range   Magnesium 2.1 1.7 - 2.4 mg/dL    Comment: Performed at [facility], [place] [address] [place], [place] [address]  Phosphorus     Status: Abnormal   Collection Time: 02/07/21 12:00 PM  Result Value Ref Range   Phosphorus 2.3 (L) 2.5 - 4.6 mg/dL    Comment: Performed at [facility] [address] [place], [place] [address]  CBC     Status: Abnormal   Collection Time: 02/08/21  6:54 AM  Result Value Ref Range   WBC 5.0 4.0 - 10.5 K/uL   RBC 3.92 3.87 - 5.11 MIL/uL   Hemoglobin 11.7 (L) 12.0 - 15.0 g/dL   HCT 35.5 (L) 36.0 - 46.0 %   MCV 90.6 80.0 - 100.0 fL   MCH 29.8 26.0 - 34.0 pg   MCHC 33.0 30.0 - 36.0 g/dL   RDW 12.5 11.5 - 15.5 %   Platelets 238 150 - 400 K/uL   nRBC 0.0 0.0 - 0.2 %    Comment: Performed at [facility], [place] [address] [place], [place] [address]  Comprehensive metabolic panel     Status: Abnormal   Collection Time: 02/08/21  6:54 AM  Result Value Ref Range   Sodium 134 (L) 135 - 145 mmol/L   Potassium 3.7 3.5 - 5.1 mmol/L   Chloride 103 98 - 111 mmol/L   CO2 26 22 - 32 mmol/L    Glucose, Bld 112 (H) 70 - 99 mg/dL    Comment: Glucose reference range applies only to samples taken after fasting for at least 8 hours.   BUN 9 6 - 20 mg/dL   Creatinine, Ser 0.76 0.44 - 1.00 mg/dL   Calcium 9.2 8.9 - 10.3 mg/dL   Total Protein 7.0 6.5 - 8.1 g/dL   Albumin 3.6 3.5 - 5.0 g/dL   AST 16 15 - 41 U/L   ALT 12 0 - 44 U/L   Alkaline Phosphatase 32 (L) 38 - 126 U/L   Total Bilirubin 0.6 0.3 - 1.2 mg/dL   GFR, Estimated >60 >60 mL/min    Comment: (NOTE) Calculated using the CKD-EPI Creatinine Equation (2021)    Anion gap 5 5 - 15    Comment: Performed at [facility] [address] [place], [place] [address]    Current Facility-Administered Medications  Medication Dose Route Frequency Provider Last Rate Last Admin  . acetaminophen (TYLENOL) tablet 650 mg  650 mg Oral Q6H PRN Maudie Mercury, MD   650 mg at 02/07/21 2215   Or  . acetaminophen (TYLENOL) suppository 650 mg  650 mg Rectal Q6H PRN Maudie Mercury, MD      . LORazepam (ATIVAN) tablet 1 mg  1 mg Oral Q6H Freida Busman, MD       Or  . LORazepam (ATIVAN) injection 1 mg  1 mg Intravenous Q6H Makena Mcgrady B, MD      . OLANZapine (ZYPREXA) tablet 2.5 mg  2.5 mg Oral BID Freida Busman, MD        Musculoskeletal: Strength & Muscle Tone:  Appropriate muscle tone Gait & Station:  Remains in bed on assessment Patient leans: N/A            Psychiatric Specialty Exam:  Presentation  General Appearance:  Bizarre Eye Contact: Fair Speech: Blocked Speech Volume: -- (mute, unable to assess) Handedness: Right  Mood and Affect  Mood: -- (mute, unable to assess)  Affect: -- (mute, unable to assess)  Thought Process  Thought Processes: Other (comment) (mute, unable to assess) Descriptions of Associations:-- (mute, unable to assess) Orientation:None Thought Content:Other (comment) (mute, unable to assess) History of Schizophrenia/Schizoaffective disorder:No data recorded Duration of  Psychotic Symptoms:No data recorded Hallucinations:Hallucinations: -- (mute, unable to assess) Ideas of Reference:-- (mute, unable to assess) Suicidal Thoughts:Suicidal Thoughts: -- (mute, unable to assess) Homicidal Thoughts:Homicidal Thoughts: -- (mute, unable to assess)  Sensorium  Memory: Other (comment) (mute, unable to assess) Judgment: Other (comment) (mute, unable to assess) Insight: Other (comment) (mute, unable to assess)  Community education officer  Concentration: -- (mute, unable to assess) Attention Span: -- (mute, unable to assess) Recall: -- (mute, unable to assess) Fund of Knowledge: -- (mute, unable to assess) Language: -- (mute, unable to assess)  Psychomotor Activity  Psychomotor Activity: Psychomotor Activity: Normal  Assets  Assets: Other (comment) (mute, unable to assess)  Sleep  Sleep: Sleep: -- (mute, unable to assess)  Physical Exam: Physical Exam Eyes:     Extraocular Movements: Extraocular movements intact.  Pulmonary:     Effort: Pulmonary effort is normal.  Musculoskeletal:        General: Normal range of motion.     Comments: Passive ROM intact in BUE, tender to palpation in bilateral upper extremities   Skin:    General: Skin is warm and dry.  Neurological:     Mental Status: She is alert.     Comments: Able to say her first name but not her last and able to say the year although she really struggles.   Review of Systems  Musculoskeletal:  Positive for myalgias.  Neurological:  Positive for headaches.  Blood pressure 108/80, pulse 69, temperature 97.8 F (36.6 C), temperature source Oral, resp. rate 18, last menstrual period 01/11/2021, SpO2 100 %. There is no height or weight on file to calculate BMI.   PGY-2 Freida Busman, MD 02/08/2021 4:21 PM

## 2021-02-08 NOTE — Progress Notes (Signed)
Lumbar puncture procedure note:  The patient was prepped in sterile fashion. Patient placed in the sitting fetal position. Midline at the L4-5 level was anesthetized with injected 1% lidocaine. LP needle introduced at the L4-5 interspace. CSF obtained on first attempt. 4 tubes containing approximately 10 cc total volume of clear, colorless CSF were obtained. Needle withdrawn and pressure placed at LP site for 1 minute with gauze. Bandage applied. Patient tolerated the procedure well. No complications. RN instructed to lay patient flat and maintain in lying position for 2 hours. Samples sent to lab.   Electronically signed: Dr. Kerney Elbe'

## 2021-02-08 NOTE — Plan of Care (Signed)

## 2021-02-09 ENCOUNTER — Inpatient Hospital Stay (HOSPITAL_COMMUNITY): Payer: Medicaid Other

## 2021-02-09 DIAGNOSIS — R4182 Altered mental status, unspecified: Secondary | ICD-10-CM | POA: Diagnosis not present

## 2021-02-09 DIAGNOSIS — E063 Autoimmune thyroiditis: Secondary | ICD-10-CM

## 2021-02-09 DIAGNOSIS — G9349 Other encephalopathy: Secondary | ICD-10-CM

## 2021-02-09 LAB — COMPREHENSIVE METABOLIC PANEL
ALT: 9 U/L (ref 0–44)
AST: 23 U/L (ref 15–41)
Albumin: 3.7 g/dL (ref 3.5–5.0)
Alkaline Phosphatase: 29 U/L — ABNORMAL LOW (ref 38–126)
Anion gap: 9 (ref 5–15)
BUN: 14 mg/dL (ref 6–20)
CO2: 23 mmol/L (ref 22–32)
Calcium: 9.4 mg/dL (ref 8.9–10.3)
Chloride: 105 mmol/L (ref 98–111)
Creatinine, Ser: 0.68 mg/dL (ref 0.44–1.00)
GFR, Estimated: >60 mL/min (ref >60)
Glucose, Bld: 107 mg/dL — ABNORMAL HIGH (ref 70–99)
Potassium: 4 mmol/L (ref 3.5–5.1)
Sodium: 137 mmol/L (ref 135–145)
Total Bilirubin: 0.9 mg/dL (ref 0.3–1.2)
Total Protein: 7 g/dL (ref 6.5–8.1)

## 2021-02-09 LAB — CBC WITH DIFFERENTIAL/PLATELET
Abs Immature Granulocytes: 0.03 10*3/uL (ref 0.00–0.07)
Basophils Absolute: 0 10*3/uL (ref 0.0–0.1)
Basophils Relative: 0 %
Eosinophils Absolute: 0 10*3/uL (ref 0.0–0.5)
Eosinophils Relative: 0 %
HCT: 37 % (ref 36.0–46.0)
Hemoglobin: 12.1 g/dL (ref 12.0–15.0)
Immature Granulocytes: 1 %
Lymphocytes Relative: 29 %
Lymphs Abs: 1.4 10*3/uL (ref 0.7–4.0)
MCH: 29.9 pg (ref 26.0–34.0)
MCHC: 32.7 g/dL (ref 30.0–36.0)
MCV: 91.4 fL (ref 80.0–100.0)
Monocytes Absolute: 0.5 10*3/uL (ref 0.1–1.0)
Monocytes Relative: 10 %
Neutro Abs: 3 10*3/uL (ref 1.7–7.7)
Neutrophils Relative %: 60 %
Platelets: 240 10*3/uL (ref 150–400)
RBC: 4.05 MIL/uL (ref 3.87–5.11)
RDW: 12.3 % (ref 11.5–15.5)
WBC: 5 10*3/uL (ref 4.0–10.5)
nRBC: 0 % (ref 0.0–0.2)

## 2021-02-09 LAB — LIPID PANEL
Cholesterol: 108 mg/dL (ref 0–200)
HDL: 40 mg/dL — ABNORMAL LOW (ref >40)
LDL Cholesterol: 62 mg/dL (ref 0–99)
Total CHOL/HDL Ratio: 2.7 RATIO
Triglycerides: 31 mg/dL (ref <150)
VLDL: 6 mg/dL (ref 0–40)

## 2021-02-09 MED ORDER — LORAZEPAM 2 MG/ML IJ SOLN
1.0000 mg | INTRAMUSCULAR | Status: AC | PRN
  Administered 2021-02-09 (×2): 1 mg via INTRAVENOUS
  Filled 2021-02-09 (×2): qty 1

## 2021-02-09 MED ORDER — SODIUM CHLORIDE 0.9 % IV SOLN
1000.0000 mg | Freq: Every day | INTRAVENOUS | Status: AC
  Administered 2021-02-09 – 2021-02-13 (×5): 1000 mg via INTRAVENOUS
  Filled 2021-02-09 (×5): qty 16

## 2021-02-09 MED ORDER — LEVOTHYROXINE SODIUM 100 MCG/5ML IV SOLN
50.0000 ug | Freq: Every day | INTRAVENOUS | Status: DC
  Administered 2021-02-10 – 2021-02-12 (×3): 50 ug via INTRAVENOUS
  Filled 2021-02-09 (×5): qty 5

## 2021-02-09 NOTE — Progress Notes (Addendum)
HD 2 SUBJECTIVE:  Patient Summary: [name] is a 20 y.o. female, with a PMHx of Hashimoto thyroiditis, who presented to [facility] (10/15) for strange behavior, isolation, mutism, and N/V, then admitted for Acute Encephalopathy vs psychogenic cause work-up.  Overnight Events: Pt was intermittently agitated and was given PRN ativan. Also family friend requested that pt be allowed respite from the restraints, which was declined by RN due to concern for pt agitation. Night team was not notified.   Interim History: Jamie [name] was seen this AM alert, on 1:1 observation with sitter and family friend at bedside. She was calm, without 2-point restraints.  Pt continues to not interact with interviewers and not following any commands, aside from looking towards directed. Unchanged from the day prior.  Per family friend at bedside, pt's agitation is unpredictable. Pt's mom will be arriving on Wednesday PM. States that pt's PO intake is decreasing, and she is concerned for nutritional status. Discussed plan with her and will obtain grandmother's personal phone for additional collateral.   Of note, attempted to contact grandmother last night with interpreter x3. Was not able to reach, left HIPAA compliant voicemail. Will try again.  OBJECTIVE:  Vital Signs: Vitals:   02/08/21 0800 02/08/21 1125 02/09/21 0518 02/09/21 0732  BP: 101/82 108/80 108/74 94/70  Pulse: 75 69 88 (!) 107  Resp:  18 (!) 31 20  Temp:   98.2 F (36.8 C) 98 F (36.7 C)  TempSrc:   Axillary Axillary  SpO2:  100% 99% 99%   Supplemental O2:  Room Air SpO2: 99 %  Physical Exam: Physical Exam Vitals and nursing note reviewed.  Constitutional:      Appearance: Normal appearance.  HENT:     Head: Normocephalic and atraumatic.     Mouth/Throat:     Comments: Dry peeling lips, gently removed by RN Eyes:     Extraocular Movements: Extraocular movements intact.  Cardiovascular:     Rate and Rhythm: Regular  rhythm. Tachycardia present.  Pulmonary:     Effort: Pulmonary effort is normal. No respiratory distress.     Breath sounds: Normal breath sounds. No wheezing or rales.  Abdominal:     General: Abdomen is flat. There is no distension.     Palpations: Abdomen is soft. There is no mass.     Tenderness: There is no abdominal tenderness. There is no guarding or rebound.     Comments: Hypoactive bowel sounds  Musculoskeletal:     Cervical back: Normal range of motion. No rigidity.     Right lower leg: No edema.     Left lower leg: No edema.  Skin:    General: Skin is warm and dry.     Comments: Multiple dark macules noted on upper thighs b/l  Neurological:     Mental Status: She is alert.     Comments: Unable to access No tremors noted, no cogwheel rigidity on UE b/l  Psychiatric:     Comments: Averbal, unable to access   Pertinent Labs: CBC Latest Ref Rng & Units 02/09/2021 02/08/2021 02/06/2021  WBC 4.0 - 10.5 K/uL 5.0 5.0 5.1  Hemoglobin 12.0 - 15.0 g/dL 12.1 11.7(L) 12.7  Hematocrit 36.0 - 46.0 % 37.0 35.5(L) 40.0  Platelets 150 - 400 K/uL 240 238 260   CMP Latest Ref Rng & Units 02/09/2021 02/08/2021 02/07/2021  Glucose 70 - 99 mg/dL 107(H) 112(H) 102(H)  BUN 6 - 20 mg/dL 14 9 13   Creatinine 0.44 - 1.00 mg/dL 0.68  0.76 0.76  Sodium 135 - 145 mmol/L 137 134(L) 137  Potassium 3.5 - 5.1 mmol/L 4.0 3.7 3.3(L)  Chloride 98 - 111 mmol/L 105 103 106  CO2 22 - 32 mmol/L 23 26 23   Calcium 8.9 - 10.3 mg/dL 9.4 9.2 8.6(L)  Total Protein 6.5 - 8.1 g/dL 7.0 7.0 6.9  Total Bilirubin 0.3 - 1.2 mg/dL 0.9 0.6 1.3(H)  Alkaline Phos 38 - 126 U/L 29(L) 32(L) 31(L)  AST 15 - 41 U/L 23 16 15   ALT 0 - 44 U/L 9 12 13    No results for input(s): GLUCAP in the last 72 hours. [facility] Pertinent Imaging: DG Pelvis Portable  Result Date: 02/09/2021 CLINICAL DATA:  20 year old female with altered mental status. Planned MRI today. Query retained metal foreign body. EXAM: PORTABLE PELVIS 1-2 VIEWS  COMPARISON:  Portable chest and abdomen today. FINDINGS: Portable AP supine view at 0931 hours. Visualized bowel gas pattern is non obstructed. Lower abdominal and pelvic visceral contours are within normal limits. EKG leads and wires. Questionable small retained metallic clip projecting over the mid right sacral ala. No other radiopaque foreign body identified. No acute osseous abnormality identified. IMPRESSION: Negative aside from a possible small metallic surgical clip in the right pelvis, which does not contraindicate MRI. Patient OK to proceed with MRI using standard instructions and precautions. Electronically Signed   By: Genevie Ann M.D.   On: 02/09/2021 09:45   DG CHEST PORT 1 VIEW  Result Date: 02/09/2021 CLINICAL DATA:  20 year old female with altered mental status. Planned MRI today. Query retained metal foreign body. EXAM: PORTABLE CHEST 1 VIEW COMPARISON:  None. FINDINGS: Portable AP semi upright view at 0928 hours. Normal lung volumes and mediastinal contours. Visualized tracheal air column is within normal limits. Allowing for portable technique the lungs are clear. Overlying EKG leads and wires. No retained radiopaque foreign body identified. No osseous abnormality identified. Negative visible upper abdomen. IMPRESSION: Negative portable chest.  No metallic foreign body identified. Electronically Signed   By: Genevie Ann M.D.   On: 02/09/2021 09:42   DG Abd Portable 1V  Result Date: 02/09/2021 CLINICAL DATA:  19 year old female with altered mental status. Planned MRI today. Query retained metal foreign body. EXAM: PORTABLE ABDOMEN - 1 VIEW COMPARISON:  Lumbar radiographs 08/13/2011. FINDINGS: Portable AP supine view at 0930 hours. Negative visible lung bases. Non obstructed bowel gas pattern. No osseous abnormality identified. Overlying EKG leads and wires. No radiopaque foreign body identified. IMPRESSION: No retained metallic foreign body in the abdomen. Electronically Signed   By: Genevie Ann M.D.    On: 02/09/2021 09:41   EEG adult  Result Date: 02/08/2021 Lora Havens, MD     02/08/2021  6:24 PM Patient Name: Jamie [name] MRN: [id number] Epilepsy Attending: Lora Havens Referring Physician/Provider: Dr Kerney Elbe Date: 10/17/ 2022 Duration: 23.31 mins Patient history:  20 y.o. female with PMH significant for Hashimoto thyroiditis who presents with vomiting, headache and not eating for 4 days and not talking since today. EEG to evaluate for seizure Level of alertness: Awake AEDs during EEG study: None Technical aspects: This EEG study was done with scalp electrodes positioned according to the 10-20 International system of electrode placement. Electrical activity was acquired at a sampling rate of 500Hz  and reviewed with a high frequency filter of 70Hz  and a low frequency filter of 1Hz . EEG data were recorded continuously and digitally stored. Description: EEG showed continuous generalized 3 to 6 Hz theta-delta slowing. Hyperventilation and photic stimulation were  not performed.   ABNORMALITY - Continuous slow, generalized IMPRESSION: This study is suggestive of moderate diffuse encephalopathy, nonspecific etiology. No seizures or epileptiform discharges were seen throughout the recording. Lora Havens   ASSESSMENT/PLAN:  Assessment: Active Problems:   Altered mental status  Jamie [name] is a 20 y.o. female, with a PMHx of Hashimoto thyroiditis, who presented to [facility] (10/15) for strange behavior, isolation, mutism, and N/V, then admitted for Acute Encephalopathy work-up.  HD 2  Plan: #Acute encephalopathy c/b abnormal behavior, agitation Patient presented to the [facility] ED with AMS, mutism, N/V. Ddx Hashimoto's encephalitis, viral encephalitis, anti-NMDA receptor encephalitis, vs other autoimmune encephalopathy. Psychiatry and neurology consulted, appreciate assistance and recommendations.  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  EEG is suggestive of moderate diffuse encephalopathy, no seizures or epileptiform. Discussed with neurology, no evidence of seizures, epileptiform discharges, or typical waveforms consistent with HSV encephalitis.  Spinal tap studies revealed CSF is clear in color, WBCs with lymphocytic predominance, elevated proteins. No organisms with stain. Consistent for autoimmune vs viral etiology. MRI limited by consent from mother as she is not in the country. XR were ordered to clear MRI.   Discussed at length with neurology today. CSF studies consistent with autoimmune vs viral etiology, viral less likely given EEG findings and known history of autoimmune disorder that may cause encephalitis although not entirely able to rule out. 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 The treatment is steroids. Of note, TPO Abs were very elevated (>900) at diagnosis in 2016. Also considering other autoimmune etiologies such as NMDA-receptor encephalitis. Continue 1:1 observation with sitter Hashimoto's encephalitis work-up per neurology, appreciate recommendations and assistance Trial of IV solumedrol 1000mg  per neurology Will also send CSF studies for anti-NMDA receptor IgG and viral studies Psychogenic etiology R/O per psychiatry, appreciate recommendations and assistance Pending MRI Off 2-point restraint this AM, discussed with nursing attempting removal whenever patient is calm, limiting time as able if safe for the patient.   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 Dx was prompted by amenorrhea. Was followed by ped endocrinologist, and recently became established with adult endocrinologist. Synthroid 100 MCG PO daily at home. Ongoing concern for med adherence vs familial discord per chart review.  Patient is self-NPO, will convert PO synthroid to IV equivalence per pharmacy, appreciate assistance. Collateral limitations per  above.  Continue IV Synthroid 50 MCG daily   #Prolonged QTc, resolved Repeat QTc 420 (10/17). Also see rightward axis. QTc 528 on 10/15. We will hold on SGA's and any other prolonging agents. Monitor K+, Mg2+ levels and replete as necessary  #Hx of Vitamin D Insufficiency: Patient with a history of vitamin D deficiency, vitamin D 25-hydroxy level 29, 4 years ago. Initially appears to have been related to diet for which the patient followed with a dietitian, but does not appear to have been reevaluated. Repeat shows level of 29, essentially unchanged. We will consider vitamin D supplement.  #Home Stressors: Patient had several Early interviews over the past several years.  Her last appointment was in February 2021, at that time her GAD-7 score was 16, and PHQ 15 score was 7, which indicates a high anxiety level and low somatization level.  Additionally at home, evident her mother have disagreements over diet, her medication, education, and having a sibling with an intellectual disability and another with depression.  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 She is not on any current medications for anxiety. At bedside, her aunt notes that the patient is worried about her future endeavors, but does not want to be labeled as having "mental issues." given her symptoms of altered mental status today, with concern for a psychiatric component, psychiatry was consulted in the ED.   Best Practice: Diet: Normal Bowel: None Pain: Tylenol 650mg  PRN IVF/Rx: methylPREDNISolone (SOLU-MEDROL) injection  None,None Abx: None VTE: Place and maintain sequential compression device Start: 02/08/21 0847  Code: Full Code  PT/OT recs: Pending, none. TOC recs: Pending  Prior to Admission Living Arrangement: Home  Anticipated Discharge Location: Pending  Barriers to Discharge: Medical management  Dispo: Anticipated discharge in >2  days.  Signature: Merrily Brittle, DO Psychiatry Resident, PGY-1 [facility] Pager: [phone] 10:32 AM, 02/09/2021  Please contact the on call pager after 5 pm and on weekends at [phone].

## 2021-02-09 NOTE — Progress Notes (Signed)
Patient evaluated at bedside after paged by RN that patient had increasingly agitation and there was some concerns of potential harm with her wrist restraints.  Upon evaluation at bedside patient did have some contractions of her upper extremities however upon talking with her she relaxed and laid in bed comfortably.  Family friends at bedside who acknowledged that she waxes and wanes in terms of the agitation.  Do not believe benzodiazepines or antipsychotics are appropriate at this time.  We will continue to monitor and if feels the patient is at harm to herself will consider medications.

## 2021-02-09 NOTE — Progress Notes (Signed)
Patient continues to have intermittent periods of agitation or withdraw. Trials have been unsuccessful for longer than 30-45 mins without restraints. Patient presents very confused and agitated. Anti-anxiety medication is effective for a short period of time. Patient is not sleeping more than 30-45 mins at a time and appears to be restless. Safety sitter has been ordered. Parents are traveling back to the [place] and have an ETA of Thursday morning. Will continue to monitor.

## 2021-02-09 NOTE — Consult Note (Signed)
[facility] Face-to-Face Psychiatry Consult   Reason for Consult: Concern for catatonia Referring Physician:  Gerlene Fee, MD Patient Identification: Jamie [name] MRN:  [id number] Principal Diagnosis: <principal problem not specified> Diagnosis:  Active Problems:   Altered mental status   Assessment  Jamie [name] is a 20 y.o. female admitted medically for acute altered mental status with mutism with recent history of nausea and vomiting.  02/06/2021  2:17 PM patient carries no known past psychiatric history.  Patient carries known past medical history of Hashimoto's thyroiditis.  Psychiatry was consulted for acute change in altered mental status with concern for catatonia.  Patient was prescribed Synthroid outpatient; however, per EMR patient's mother endorsed patient had not been compliant.  On initial examination, patient is only able to answer very few questions with words and mostly responds with a high-pitched "hmm" as if she does not know what was asked. We plan to do an Ativan trial for possible catatonia as well as start low-dose antipsychotics to help with severe agitation that has been noted during hospitalization.  Patient had a family friend in room who endorsed the patient's mental status appears to wax and wane.  Increasing concern for delirium related to medical illness.  However, patient was able to endorse being in pain in her upper extremities.  We will attempt chemical restraints to help decrease frequency of severe agitation outbursts leading to physical restraints.  10/18: Seen with resident who wrote original consult note; agree with assessement and plan. Deferred ativan trial (pt was supposed to get ativan prior to MRI, was going to have nurse message to evaluate before/after does not appear to have gotten it). 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 Will likely do formal ativan challenge tomorrow if pt still unable to take PO - although agree with neurology that tx of underlying medical cause at this time is of primary importance, pts with catatonia 2/2 encephalitis can still often have some degree of response to standard treatments for catatonia such as lorazepam.   Labs reviewed: UA-WNL W/small bilirubin, 40 ketones and trace Hgb; urine pregnancy-negative; CMP-WNL W/chloride 97, creatinine 1.2, T protein 8.3; EtOH-negative, CBC-WNL, TSH-10.095, T3-1 0.6, T4-WNL, ESR-WNL, CRP-WNL, TPO-268, thyroglobulin antibody-57.6, UDS-negative, folate-21.4, B12-WNL, acetaminophen-negative, CMP-WNL W/exception potassium 3.3 and glucose 102, calcium 8.6 and albumin 3.4T bili 1.3 and alk phos 31: HIV-negative, vitamin D-29, mag-WNL, Phos-2.3 EKG-QTC 528 LP-02/08/2021-pending  Plan Concern for catatonia (r/o autoimmune encephalitis, r/o MDD) -Autoimmune encephalitis work-up per neurology - ativan stopped by ?neurology 2/2 low concern for seizure, feeling this is encephalitis>catatonia -- wanted to do what functionally amounted to ativan challenge today (observe pt before and after ativan for MRI) however doesn't appear to have gotten this med prior to transport.   - Hold Zyprexa 2.5 mg twice daily due to prolonged QTC, for severe agitation - EKG 10/18 Qtc 420  Thank you for this consult.  Psychiatry will continue to follow.   Total Time spent with patient: 15 minutes   I personally spent 20 minutes on the unit in direct patient care. The direct patient care time included face-to-face time with the patient, reviewing the patient's chart, communicating with other professionals, and coordinating care. Greater than 50% of this time was spent in counseling or coordinating care with the patient regarding goals of hospitalization, psycho-education, and discharge planning  needs.  Subjective:   Jamie [name] is a 20 y.o. [place]-American female patient admitted with acute altered mental status and recent history of nausea and vomiting.  Patient has no known psychiatric history but does have a history of Hashimoto's thyroiditis.  HPI: On assessment today patient is not able to give much history.  She attempts to answer questions but is unable to state her name and does not have much coherent speech. Unable to utilize emotion cards as yesterday. She attempted to participate in [facility] however was unable to do this in a meaningful fashion (constant meaningless fidgeting with hands (stereotypy) prevented her from consistently holding/squeezing fingers)  Brief BFCRS @ bedside (+) for mutism, stereotypy, withdrawal - given that we are unable to assess delirium this is largely equivocal. On chart review there is additional evidence for excitement (has been agitated but not goal directed) and possibly verbigeration.   Past Psychiatric History: None  Risk to Self:   Risk to Others:   Prior Inpatient Therapy:   Prior Outpatient Therapy:    Past Medical History:  Past Medical History:  Diagnosis Date  . Acquired autoimmune hypothyroidism    Dx 12/2014, TSH 110, FT4 0.3   History reviewed. No pertinent surgical history. Family History:  Family History  Problem Relation Age of Onset  . Healthy Mother   . Healthy Father    Family Psychiatric  History: None known Social History:  Social History   Substance and Sexual Activity  Alcohol Use Not Currently     Social History   Substance and Sexual Activity  Drug Use Not Currently    Social History   Socioeconomic History  . Marital status: Single    Spouse name: Not on file  . Number of children: Not on file  . Years of education: Not on file  . Highest education level: Not on file  Occupational History  . Not on file  Tobacco Use  . Smoking status: Never  . Smokeless tobacco: Never   Substance and Sexual Activity  . Alcohol use: Not Currently  . Drug use: Not Currently  . Sexual activity: Not on file  Other Topics Concern  . Not on file  Social History Narrative   Lives at home with mom and maternal grandmother and two siblings attends [employer] is in the 8th grade.    Social Determinants of Health   Financial Resource Strain: Not on file  Food Insecurity: Not on file  Transportation Needs: Not on file  Physical Activity: Not on file  Stress: Not on file  Social Connections: Not on file   Additional Social History:    Allergies:  No Known Allergies  Labs:  Results for orders placed or performed during the hospital encounter of 02/06/21 (from the past 48 hour(s))  CBC     Status: Abnormal   Collection Time: 02/08/21  6:54 AM  Result Value Ref Range   WBC 5.0 4.0 - 10.5 K/uL   RBC 3.92 3.87 - 5.11 MIL/uL   Hemoglobin 11.7 (L) 12.0 - 15.0 g/dL   HCT 35.5 (L) 36.0 - 46.0 %   MCV 90.6 80.0 - 100.0 fL   MCH 29.8 26.0 - 34.0 pg   MCHC 33.0 30.0 - 36.0 g/dL   RDW 12.5 11.5 - 15.5 %   Platelets 238 150 - 400 K/uL   nRBC 0.0 0.0 - 0.2 %    Comment: Performed at [facility], [place] [address] [place], [place] [address]  Comprehensive metabolic panel  Status: Abnormal   Collection Time: 02/08/21  6:54 AM  Result Value Ref Range   Sodium 134 (L) 135 - 145 mmol/L   Potassium 3.7 3.5 - 5.1 mmol/L   Chloride 103 98 - 111 mmol/L   CO2 26 22 - 32 mmol/L   Glucose, Bld 112 (H) 70 - 99 mg/dL    Comment: Glucose reference range applies only to samples taken after fasting for at least 8 hours.   BUN 9 6 - 20 mg/dL   Creatinine, Ser 0.76 0.44 - 1.00 mg/dL   Calcium 9.2 8.9 - 10.3 mg/dL   Total Protein 7.0 6.5 - 8.1 g/dL   Albumin 3.6 3.5 - 5.0 g/dL   AST 16 15 - 41 U/L   ALT 12 0 - 44 U/L   Alkaline Phosphatase 32 (L) 38 - 126 U/L   Total Bilirubin 0.6 0.3 - 1.2 mg/dL   GFR, Estimated >60 >60 mL/min    Comment: (NOTE) Calculated using  the CKD-EPI Creatinine Equation (2021)    Anion gap 5 5 - 15    Comment: Performed at [facility] [address] [place], [place] [address]  CSF cell count with differential collection tube #: 1     Status: Abnormal   Collection Time: 02/08/21  3:31 PM  Result Value Ref Range   Tube # 1    Color, CSF COLORLESS COLORLESS   Appearance, CSF CLEAR (A) CLEAR   Supernatant NOT INDICATED    RBC Count, CSF 3 (H) 0 /cu mm   WBC, CSF 318 (HH) 0 - 5 /cu mm    Comment: CRITICAL RESULT CALLED TO, READ BACK BY AND VERIFIED WITH:  Cletis Athens, RN, 2026, 02/08/21, E. ADEDOKUN.    Segmented Neutrophils-CSF 0 0 - 6 %   Lymphs, CSF 94 (H) 40 - 80 %   Monocyte-Macrophage-Spinal Fluid 6 (L) 15 - 45 %   Eosinophils, CSF 0 0 - 1 %    Comment: Performed at [facility] [address] [place], [place] [address]  CSF cell count with differential     Status: Abnormal   Collection Time: 02/08/21  3:31 PM  Result Value Ref Range   Tube # 4    Color, CSF COLORLESS COLORLESS   Appearance, CSF CLEAR (A) CLEAR   Supernatant NOT INDICATED    RBC Count, CSF 2 (H) 0 /cu mm   WBC, CSF 273 (HH) 0 - 5 /cu mm    Comment: CRITICAL RESULT CALLED TO, READ BACK BY AND VERIFIED WITH:  Cletis Athens, RN, 2025, 02/08/21, E. ADEDOKUN.    Segmented Neutrophils-CSF 0 0 - 6 %   Lymphs, CSF 92 (H) 40 - 80 %   Monocyte-Macrophage-Spinal Fluid 8 (L) 15 - 45 %   Eosinophils, CSF 0 0 - 1 %    Comment: Performed at [facility] [address], [place], [place] [address]  CSF culture w Gram Stain     Status: None (Preliminary result)   Collection Time: 02/08/21  3:31 PM   Specimen: CSF; Cerebrospinal Fluid  Result Value Ref Range   Specimen Description CSF    Special Requests NONE    Gram Stain      WBC PRESENT, PREDOMINANTLY MONONUCLEAR NO ORGANISMS SEEN CYTOSPIN SMEAR    Culture      NO GROWTH < 24 HOURS Performed at [facility], [place] [address], [place], [place] [address]    Report Status  PENDING  Protein and glucose, CSF     Status: Abnormal   Collection Time: 02/08/21  3:31 PM  Result Value Ref Range   Glucose, CSF 68 40 - 70 mg/dL   Total  Protein, CSF 50 (H) 15 - 45 mg/dL    Comment: Performed at [facility] [address] [place], [place] [address]  Culture, fungus without smear     Status: None (Preliminary result)   Collection Time: 02/08/21  3:31 PM   Specimen: CSF; Cerebrospinal Fluid  Result Value Ref Range   Specimen Description CSF    Special Requests NONE    Culture      NO FUNGUS ISOLATED AFTER 1 DAY Performed at [facility] [address], [place], [place] [address]    Report Status PENDING   CBC with Differential/Platelet     Status: None   Collection Time: 02/09/21  3:44 AM  Result Value Ref Range   WBC 5.0 4.0 - 10.5 K/uL   RBC 4.05 3.87 - 5.11 MIL/uL   Hemoglobin 12.1 12.0 - 15.0 g/dL   HCT 37.0 36.0 - 46.0 %   MCV 91.4 80.0 - 100.0 fL   MCH 29.9 26.0 - 34.0 pg   MCHC 32.7 30.0 - 36.0 g/dL   RDW 12.3 11.5 - 15.5 %   Platelets 240 150 - 400 K/uL   nRBC 0.0 0.0 - 0.2 %   Neutrophils Relative % 60 %   Neutro Abs 3.0 1.7 - 7.7 K/uL   Lymphocytes Relative 29 %   Lymphs Abs 1.4 0.7 - 4.0 K/uL   Monocytes Relative 10 %   Monocytes Absolute 0.5 0.1 - 1.0 K/uL   Eosinophils Relative 0 %   Eosinophils Absolute 0.0 0.0 - 0.5 K/uL   Basophils Relative 0 %   Basophils Absolute 0.0 0.0 - 0.1 K/uL   Immature Granulocytes 1 %   Abs Immature Granulocytes 0.03 0.00 - 0.07 K/uL    Comment: Performed at [facility], [address], , [place] [address]  Comprehensive metabolic panel     Status: Abnormal   Collection Time: 02/09/21  3:44 AM  Result Value Ref Range   Sodium 137 135 - 145 mmol/L   Potassium 4.0 3.5 - 5.1 mmol/L   Chloride 105 98 - 111 mmol/L   CO2 23 22 - 32 mmol/L   Glucose, Bld 107 (H) 70 - 99 mg/dL    Comment: Glucose reference range applies only to samples taken after fasting for at least 8 hours.    BUN 14 6 - 20 mg/dL   Creatinine, Ser 0.68 0.44 - 1.00 mg/dL   Calcium 9.4 8.9 - 10.3 mg/dL   Total Protein 7.0 6.5 - 8.1 g/dL   Albumin 3.7 3.5 - 5.0 g/dL   AST 23 15 - 41 U/L   ALT 9 0 - 44 U/L   Alkaline Phosphatase 29 (L) 38 - 126 U/L   Total Bilirubin 0.9 0.3 - 1.2 mg/dL   GFR, Estimated >60 >60 mL/min    Comment: (NOTE) Calculated using the CKD-EPI Creatinine Equation (2021)    Anion gap 9 5 - 15    Comment: Performed at [facility] [address] [place], [place] [address]  Lipid panel     Status: Abnormal   Collection Time: 02/09/21  3:44 AM  Result Value Ref Range   Cholesterol 108 0 - 200 mg/dL   Triglycerides 31 <150 mg/dL   HDL 40 (L) >40  mg/dL   Total CHOL/HDL Ratio 2.7 RATIO   VLDL 6 0 - 40 mg/dL   LDL Cholesterol 62 0 - 99 mg/dL    Comment:        Total Cholesterol/HDL:CHD Risk Coronary Heart Disease Risk Table                     Men   Women  1/2 Average Risk   3.4   3.3  Average Risk       5.0   4.4  2 X Average Risk   9.6   7.1  3 X Average Risk  23.4   11.0        Use the calculated Patient Ratio above and the CHD Risk Table to determine the patient's CHD Risk.        ATP III CLASSIFICATION (LDL):  <100     mg/dL   Optimal  100-129  mg/dL   Near or Above                    Optimal  130-159  mg/dL   Borderline  160-189  mg/dL   High  >190     mg/dL   Very High Performed at [facility] [address] [place], [place] [address]     Current Facility-Administered Medications  Medication Dose Route Frequency Provider Last Rate Last Admin  . acetaminophen (TYLENOL) tablet 650 mg  650 mg Oral Q6H PRN Maudie Mercury, MD   650 mg at 02/07/21 2215   Or  . acetaminophen (TYLENOL) suppository 650 mg  650 mg Rectal Q6H PRN Maudie Mercury, MD      . levothyroxine (SYNTHROID, LEVOTHROID) injection 50 mcg  50 mcg Intravenous Daily Katsadouros, Vasilios, MD      . methylPREDNISolone sodium succinate (SOLU-MEDROL) 1,000 mg in sodium chloride  0.9 % 50 mL IVPB  1,000 mg Intravenous Daily Kerney Elbe, MD 66 mL/hr at 02/09/21 1648 1,000 mg at 02/09/21 1648    Musculoskeletal: Strength & Muscle Tone:  Appropriate muscle tone Gait & Station:  Remains in bed on assessment Patient leans: N/A            Psychiatric Specialty Exam:  Presentation  General Appearance:  Bizarre Eye Contact: Poor (occasionally tracked conversation (ie when speaking to nurse) but did not make eye contact when directly examined) Speech: -- (no coherent speech during entirety of exam) Speech Volume: -- (did not speak) Handedness: -- (friend at bedside unsure)  Mood and Affect  Mood: -- (unable to assess) Affect: -- (confused, scared)  Thought Process  Thought Processes: -- (unable to assess) Descriptions of Associations:-- (unable to assess) Orientation:-- (unable to assess) Thought Content:-- (unable to assess) History of Schizophrenia/Schizoaffective disorder:No data recorded Duration of Psychotic Symptoms:No data recorded Hallucinations:Hallucinations: -- (unable to assess, not overtly RIS) Ideas of Reference:-- (mute, unable to assess) Suicidal Thoughts:Suicidal Thoughts: -- (mute, unable to assess) Homicidal Thoughts:Homicidal Thoughts: -- (mute, unable to assess)  Sensorium  Memory: -- (unable to assess) Judgment: -- (poor as evidenced by recent attempts at elopement) Insight: -- (poor as evidenced by recent comments to nursing staff (prior to mutism))  Executive Functions  Concentration: -- (mute, unable to assess) Attention Span: -- (mute, unable to assess) Recall: -- (mute, unable to assess) Fund of Knowledge: -- (mute, unable to assess) Language: -- (mute, unable to assess)  Psychomotor Activity  Psychomotor Activity: Psychomotor Activity: -- (stereotypy (constantly moving hands, fidgeting with hair, gown, etc))  Assets  Assets: Social Support  Sleep  Sleep: Sleep: -- (did sleep per friend at  bedside unknown amount)  Physical Exam: Physical Exam Eyes:     Extraocular Movements: Extraocular movements intact.  Pulmonary:     Effort: Pulmonary effort is normal.  Musculoskeletal:        General: Normal range of motion.     Comments: Passive ROM intact in BUE  Skin:    General: Skin is warm and dry.  Neurological:     Mental Status: She is alert.     Comments: Unable to interact in a meaningful way today   Review of Systems  Unable to perform ROS: Patient nonverbal  Blood pressure (P) 96/68, pulse (P) 98, temperature (P) 98.8 F (37.1 C), temperature source (P) Oral, resp. rate (P) 20, last menstrual period 01/11/2021, SpO2 (P) 99 %. There is no height or weight on file to calculate BMI.   Joycelyn Schmid A Malena Timpone 02/09/2021 5:01 PM

## 2021-02-09 NOTE — Progress Notes (Addendum)
Tele notified me of pt elevated HR several times tonight and when checking on the pt she is agitated and trying to get out of restraints. Pt sleeps for a little bit after ativan but then back up trying to get free. Family in room asking to release restraints for a little while. Explained that it is for pt and our safety since pt is agitated and not following commands. Pt has not spoke and seems to be confused. Cont to monitor   Pt cont to be restless and refusing vitals as she will not stay still to get. Family member in room has asked several times to release restraints but pt is agitated and does not follow commands so let family member know that because of her confusion the restraints are for her and our safety. Cont to monitor.

## 2021-02-09 NOTE — Plan of Care (Signed)
  Problem: Safety: Goal: Non-violent Restraint(s) Outcome: Not Progressing   Problem: Education: Goal: Knowledge of General Education information will improve Description: Including pain rating scale, medication(s)/side effects and non-pharmacologic comfort measures Outcome: Not Progressing   Problem: Health Behavior/Discharge Planning: Goal: Ability to manage health-related needs will improve Outcome: Not Progressing   Problem: Clinical Measurements: Goal: Ability to maintain clinical measurements within normal limits will improve Outcome: Not Progressing Goal: Will remain free from infection Outcome: Not Progressing Goal: Diagnostic test results will improve Outcome: Not Progressing Goal: Respiratory complications will improve Outcome: Not Progressing Goal: Cardiovascular complication will be avoided Outcome: Not Progressing   Problem: Activity: Goal: Risk for activity intolerance will decrease Outcome: Not Progressing   Problem: Nutrition: Goal: Adequate nutrition will be maintained Outcome: Not Progressing   Problem: Coping: Goal: Level of anxiety will decrease Outcome: Not Progressing   Problem: Elimination: Goal: Will not experience complications related to bowel motility Outcome: Not Progressing Goal: Will not experience complications related to urinary retention Outcome: Not Progressing   Problem: Pain Managment: Goal: General experience of comfort will improve Outcome: Not Progressing   Problem: Safety: Goal: Ability to remain free from injury will improve Outcome: Not Progressing   Problem: Skin Integrity: Goal: Risk for impaired skin integrity will decrease Outcome: Not Progressing   

## 2021-02-09 NOTE — Progress Notes (Signed)
Neurology Progress Note  Brief HPI: 20 y.o. 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 intake for 4 days and decreased verbalization 10/15 prior to arrival.  Initial work-up revealed a mildly elevated creatinine, TSH of 10.09, negative ETOH, and an abnormally elevated thyroperoxidase Ab of 268.   Excerpt from 2016 clinic note with Endocrinology: "Marella was seen in her PCP office in September 2016 for her 14 year [facility] and to have a sports physical done. At that visit they discussed that she was not yet menarchal. Labs revealed hypothyroidism with TSH 110 and free T4 0.3 (obtained 01/18/2014). She was started on 25 mcg of Synthroid and referred to endocrinology for further evaluation and management.   Her first visit to [facility] was 02/23/2015, at which time TFTs had improved but continued to be abnormal (TSH 25.065, FT4 0.46 with elevated thyroglobulin Ab and TPO Ab).  Levothyroxine dose was increased to 44mcg daily at that time."  Per Hospitalist team, about 5 years ago, per records, the patient's anti-TPO Ab titer was > 900.  Subjective: Patient with elevated heart rate overnight with agitation and restlessness CSF results reveal a significant lymphocytic pleocytosis with a mildly elevated protein and normal glucose.   Exam: Vitals:   02/09/21 0518 02/09/21 0732  BP: 108/74 94/70  Pulse: 88 (!) 107  Resp: (!) 31   Temp: 98.2 F (36.8 C)   SpO2: 99% 99%   Gen: Sitting up in bed with family friend at bedside, in no acute distress, does not appear restless during neurology examination Psych: She has a flat affect but remains calm throughout assessment Resp: non-labored breathing, no respiratory distress on room air Abd: soft, non-tender, non-distended  Neuro: Mental Status: Drowsy, wakes easily to voice. She continues to have minimal speech output. She speaks in a barely audible tone and states her first name but does  not answer further orientation questions. She sometimes mimics last word of provider's questioning.  Speech is non-fluent, hypophonic, and with delayed responses. She follows simple commands with repeat instructions i.e. show two fingers, squeeze examiner's hand, tongue protrusion. She is unable to name objects or repeat phrases.  She briefly fixates and tracks examiner. Cranial Nerves: PERRL, she briefly fixates and tracks examiner in lateral eye fields with roving eye movements on passive eye opening and intermittent left eye nystagmus, patient blinks to threat throughout, face is symmetric resting and with minimal movement, facial sensation is intact and symmetric to light touch, hearing is intact to voice, shoulders shrug symmetrically, tongue protrudes minimally at midline.  Motor: Bradykinetic movements noted throughout. Formal strength assessment is limited due to patient cooperation.  Bilateral upper and lower extremities elevate antigravity without vertical drift without noted asymmetry. Grip strength is 4/5 bilaterally.  Tone and bulk are normal.  Sensory: Patient nods "yes" to intact and symmetric sensation to light touch in bilateral upper and lower extremities.  DTR: Brisk 3+ and symmetric biceps and patellae, 3-4 beats of clonus with right ankle dorsiflexion, 5-6 beats of clonus present with left ankle dorsiflexion. Hoffman's sign is equivocal bilaterally. Toes downgoing.  Gait: Deferred  Pertinent Labs: CBC    Component Value Date/Time   WBC 5.0 02/09/2021 0344   RBC 4.05 02/09/2021 0344   HGB 12.1 02/09/2021 0344   HCT 37.0 02/09/2021 0344   PLT 240 02/09/2021 0344   MCV 91.4 02/09/2021 0344   MCH 29.9 02/09/2021 0344   MCHC 32.7 02/09/2021 0344  RDW 12.3 02/09/2021 0344   LYMPHSABS 1.4 02/09/2021 0344   MONOABS 0.5 02/09/2021 0344   EOSABS 0.0 02/09/2021 0344   BASOSABS 0.0 02/09/2021 0344   CMP     Component Value Date/Time   NA 137 02/09/2021 0344   K 4.0  02/09/2021 0344   CL 105 02/09/2021 0344   CO2 23 02/09/2021 0344   GLUCOSE 107 (H) 02/09/2021 0344   BUN 14 02/09/2021 0344   CREATININE 0.68 02/09/2021 0344   CALCIUM 9.4 02/09/2021 0344   PROT 7.0 02/09/2021 0344   ALBUMIN 3.7 02/09/2021 0344   AST 23 02/09/2021 0344   ALT 9 02/09/2021 0344   ALKPHOS 29 (L) 02/09/2021 0344   BILITOT 0.9 02/09/2021 0344   GFRNONAA >60 02/09/2021 0344   Lab Results  Component Value Date   VITAMINB12 692 02/07/2021   Urinalysis    Component Value Date/Time   COLORURINE YELLOW 02/07/2021 0600   APPEARANCEUR HAZY (A) 02/07/2021 0600   LABSPEC 1.017 02/07/2021 0600   PHURINE 5.0 02/07/2021 0600   GLUCOSEU NEGATIVE 02/07/2021 0600   HGBUR MODERATE (A) 02/07/2021 0600   BILIRUBINUR NEGATIVE 02/07/2021 0600   KETONESUR 80 (A) 02/07/2021 0600   PROTEINUR NEGATIVE 02/07/2021 0600   UROBILINOGEN 0.2 02/05/2021 1459   NITRITE NEGATIVE 02/07/2021 0600   LEUKOCYTESUR NEGATIVE 02/07/2021 0600   Drugs of Abuse     Component Value Date/Time   LABOPIA NONE DETECTED 02/07/2021 0600   COCAINSCRNUR NONE DETECTED 02/07/2021 0600   LABBENZ NONE DETECTED 02/07/2021 0600   AMPHETMU NONE DETECTED 02/07/2021 0600   THCU NONE DETECTED 02/07/2021 0600   LABBARB NONE DETECTED 02/07/2021 0600    Alcohol Level    Component Value Date/Time   ETH <10 02/06/2021 1435    Ref. Range 02/06/2021 17:47 02/07/2021 04:08 02/07/2021 04:43  TSH Latest Ref Range: 0.350 - 4.500 uIU/mL 10.095 (H)    Triiodothyronine,Free,Serum Latest Ref Range: 2.3 - 5.0 pg/mL 1.6 (L)    T4,Free(Direct) Latest Ref Range: 0.61 - 1.12 ng/dL 0.74    Thyroperoxidase Ab SerPl-aCnc Latest Ref Range: 0 - 26 IU/mL   268 (H)  Erythrocyte Sedimentation Rate     Component Value Date/Time   ESRSEDRATE 15 02/07/2021 0443   Lab Results  Component Value Date   CRP 0.7 02/07/2021    Ref. Range 02/08/2021 15:31 02/08/2021 15:31  Appearance, CSF Latest Ref Range: CLEAR  CLEAR (A) CLEAR (A)   Glucose, CSF Latest Ref Range: 40 - 70 mg/dL 68   RBC Count, CSF Latest Ref Range: 0 /cu mm 3 (H) 2 (H)  WBC, CSF Latest Ref Range: 0 - 5 /cu mm 318 (HH) 273 (HH)  Segmented Neutrophils-CSF Latest Ref Range: 0 - 6 % 0 0  Lymphs, CSF Latest Ref Range: 40 - 80 % 94 (H) 92 (H)  Monocyte-Macrophage-Spinal Fluid Latest Ref Range: 15 - 45 % 6 (L) 8 (L)  Eosinophils, CSF Latest Ref Range: 0 - 1 % 0 0  Color, CSF Latest Ref Range: COLORLESS  COLORLESS COLORLESS  Supernatant Unknown NOT INDICATED NOT INDICATED  Total  Protein, CSF Latest Ref Range: 15 - 45 mg/dL 50 (H)   Tube # Unknown 1 4   Imaging Reviewed:  CT head wo contrast 10/16: Negative head CT  EEG 10/17: This study is suggestive of moderate diffuse encephalopathy, nonspecific etiology. No seizures or epileptiform discharges were seen throughout the recording.  Assessment: 20 y.o. female with PMH significant for Hashimoto thyroiditis who presented to the ED 10/15 with  vomiting, headache, and decreased PO intake for 4 days with decreased verbalization starting on 10/15.  - Examination reveals patient that is acutely encephalopathic with decreased verbalization who is not oriented and requires constant prompting for command following. She does not have any noted focal weakness on examination. She does have reported waxing and waning mental status with varying degrees of agitation and drowsiness.  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 Of note, approximately 80% of Hashimoto encephalopathy patients have an abnormal LP. CSF findings with lymphocytic pleocytosis, mildly elevated protein, and normal glucose. 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 However, she does not have clinically evident seizures or myoclonus. It is felt less likely that her presentation is consistent with an isolated  psychiatric presentation with markedly abnormal lab results.  - Given her poor participation with exam, aphemia secondary to a left precentral gyrus stroke is a possibility. Will get an MRI Brain with and without contrast to evaluate. MRI also may show lesional hyperintensity in Hashimoto's encephalopathy.  - Per UpToDate: "Nonspecific electroencephalographic (EEG) abnormalities are seen in 90 to 98 percent of patients, usually demonstrating nonspecific slowing of background activity. Focal spikes or sharp waves and transient epileptic activity are less common. Triphasic waves and frontal intermittent rhythmic delta activity (FIRDA) have also been described." Patient's EEG revealed moderate diffuse encephalopathy with nonspecific etiology without seizures or epileptiform discharges.  - Regarding treatment, per UpToDate: "While most patients (90 to 98 percent) respond to glucocorticoid therapy, recovery may be incomplete. In one series of 24 patients, only 32 percent obtained a complete response to steroids. Response to glucocorticoids was not predicted by clinical features or antibody levels. A small number (5 percent) of reported patients have been treated with other immunosuppressive medications, including azathioprine, cyclophosphamide, methotrexate, rituximab, and hydroxychloroquine. These are generally reserved for patients who cannot tolerate glucocorticoids, or those who do not respond to or relapse after or during tapering of glucocorticoid therapy. Clinical improvement with intravenous immune globulin and plasmapheresis has been reported in individual cases.  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 EEG findings not suggestive of HSV and CSF findings with small number of RBC most consistent with expected minimal venous blood in CSF sample rather than hemorrhage from HSV.  - Bacterial meningitis unlikely given no meningismus and no neutrophils in CSF sample - Neurology will continue to follow   Anibal Henderson, AGACNP-BC [facility] [phone]  Electronically signed: Dr. Kerney Elbe

## 2021-02-09 NOTE — Progress Notes (Signed)
Patient increasingly agitated. Patient pulling on restraints and elevated heart rate. MD paged. MD assessed. MD noted to continue to monitor.

## 2021-02-10 DIAGNOSIS — F061 Catatonic disorder due to known physiological condition: Secondary | ICD-10-CM

## 2021-02-10 DIAGNOSIS — R4182 Altered mental status, unspecified: Secondary | ICD-10-CM | POA: Diagnosis not present

## 2021-02-10 LAB — IGG CSF INDEX
Albumin CSF-mCnc: 26 mg/dL (ref 7–29)
Albumin: 4.5 g/dL (ref 3.9–5.0)
CSF IgG Index: 1.4 — ABNORMAL HIGH (ref 0.0–0.7)
IgG (Immunoglobin G), Serum: 1339 mg/dL (ref 719–1475)
IgG, CSF: 10.5 mg/dL — ABNORMAL HIGH (ref 0.0–6.7)
IgG/Alb Ratio, CSF: 0.4 — ABNORMAL HIGH (ref 0.00–0.25)

## 2021-02-10 LAB — CBC WITH DIFFERENTIAL/PLATELET
Abs Immature Granulocytes: 0 10*3/uL (ref 0.00–0.07)
Basophils Absolute: 0 10*3/uL (ref 0.0–0.1)
Basophils Relative: 0 %
Eosinophils Absolute: 0 10*3/uL (ref 0.0–0.5)
Eosinophils Relative: 0 %
HCT: 38 % (ref 36.0–46.0)
Hemoglobin: 12.5 g/dL (ref 12.0–15.0)
Lymphocytes Relative: 10 %
Lymphs Abs: 0.5 10*3/uL — ABNORMAL LOW (ref 0.7–4.0)
MCH: 30 pg (ref 26.0–34.0)
MCHC: 32.9 g/dL (ref 30.0–36.0)
MCV: 91.1 fL (ref 80.0–100.0)
Monocytes Absolute: 0 10*3/uL — ABNORMAL LOW (ref 0.1–1.0)
Monocytes Relative: 0 %
Myelocytes: 1 %
Neutro Abs: 4.1 10*3/uL (ref 1.7–7.7)
Neutrophils Relative %: 89 %
Platelets: 264 10*3/uL (ref 150–400)
RBC: 4.17 MIL/uL (ref 3.87–5.11)
RDW: 12.4 % (ref 11.5–15.5)
WBC: 4.6 10*3/uL (ref 4.0–10.5)
nRBC: 0 % (ref 0.0–0.2)
nRBC: 0 /100 WBC

## 2021-02-10 LAB — COMPREHENSIVE METABOLIC PANEL
ALT: 13 U/L (ref 0–44)
AST: 16 U/L (ref 15–41)
Albumin: 3.9 g/dL (ref 3.5–5.0)
Alkaline Phosphatase: 35 U/L — ABNORMAL LOW (ref 38–126)
Anion gap: 10 (ref 5–15)
BUN: 17 mg/dL (ref 6–20)
CO2: 23 mmol/L (ref 22–32)
Calcium: 9.7 mg/dL (ref 8.9–10.3)
Chloride: 102 mmol/L (ref 98–111)
Creatinine, Ser: 0.57 mg/dL (ref 0.44–1.00)
GFR, Estimated: >60 mL/min (ref >60)
Glucose, Bld: 133 mg/dL — ABNORMAL HIGH (ref 70–99)
Potassium: 3.6 mmol/L (ref 3.5–5.1)
Sodium: 135 mmol/L (ref 135–145)
Total Bilirubin: 0.5 mg/dL (ref 0.3–1.2)
Total Protein: 7.4 g/dL (ref 6.5–8.1)

## 2021-02-10 LAB — HIV-1 RNA QUANT-NO REFLEX-BLD
HIV 1 RNA Quant: <20 copies/mL
LOG10 HIV-1 RNA: UNDETERMINED log10copy/mL

## 2021-02-10 LAB — PATHOLOGIST SMEAR REVIEW

## 2021-02-10 MED ORDER — POTASSIUM CHLORIDE 10 MEQ/100ML IV SOLN
10.0000 meq | INTRAVENOUS | Status: AC
  Administered 2021-02-10 (×4): 10 meq via INTRAVENOUS
  Filled 2021-02-10 (×4): qty 100

## 2021-02-10 MED ORDER — LACTATED RINGERS IV SOLN: INTRAVENOUS | Status: AC

## 2021-02-10 MED ORDER — IMMUNE GLOBULIN (HUMAN) 10 GM/100ML IV SOLN
400.0000 mg/kg | INTRAVENOUS | Status: DC
  Filled 2021-02-10: qty 250

## 2021-02-10 MED ORDER — PANTOPRAZOLE SODIUM 40 MG IV SOLR
40.0000 mg | Freq: Every day | INTRAVENOUS | Status: DC
  Administered 2021-02-10 – 2021-02-12 (×3): 40 mg via INTRAVENOUS
  Filled 2021-02-10 (×3): qty 40

## 2021-02-10 MED ORDER — PANTOPRAZOLE SODIUM 40 MG IV SOLR: 40.0000 mg | Freq: Every day | INTRAVENOUS | Status: DC

## 2021-02-10 NOTE — Progress Notes (Signed)
Paged by RN that the patient's mother would like [place] to hold off on IVIG therapy until she is able to see the patient. The patient's mother is currently flying in from [place] and will be at the hospital tonight/tomorrow morning. Spoke with the patient's aunt, who served as Optometrist while the mother on speaker phone, and the mother would like [place] to delay IVIG therapy until she can speak to the day team. She does not have any specific concerns with the IVIG therapy, however, she would just like to see how her daughter has responded to the steroids before trying an additional therapy. The aunt and mother were also wanting to discuss why IVIG was being started, when the NMDA antibody testing has not returned. Discussed with the mother and aunt that we are treating with IVIG and steroids to cover for multiple different types of encephalitis, however they would like to wait to discuss this in the morning with the day physicians. The patient cannot make her own decisions currently and thus, will wait until her mother (decision maker) is available to discuss with the day team.    Buddy Duty, D.O PGY-1, Internal Medicine Resident On-call pager: [phone]

## 2021-02-10 NOTE — Consult Note (Signed)
[facility] Face-to-Face Psychiatry Consult   Reason for Consult:  Concern for catatonia Referring Physician:  Gerlene Fee, MD Patient Identification: Jamie [name] MRN:  [id number] Principal Diagnosis: <principal problem not specified> Diagnosis:  Active Problems:   Altered mental status Assessment  Jamie [name] is a 20 y.o. female admitted medically for acute altered mental status with mutism with recent history of nausea and vomiting.  02/06/2021  2:17 PM patient carries no known past psychiatric history.  Patient carries known past medical history of Hashimoto's thyroiditis.  Psychiatry was consulted for acute change in altered mental status with concern for catatonia.  Patient was prescribed Synthroid outpatient; however, per EMR patient's mother endorsed patient had not been compliant.  On initial examination, patient is only able to answer very few questions with words and mostly responds with a high-pitched "hmm" as if she does not know what was asked. We plan to do an Ativan trial for possible catatonia as well as start low-dose antipsychotics to help with severe agitation that has been noted during hospitalization.   Patient had a family friend in room who endorsed the patient's mental status appears to wax and wane.  Increasing concern for delirium related to medical illness.  However, patient was able to endorse being in pain in her upper extremities.  We will attempt chemical restraints to help decrease frequency of severe agitation outbursts leading to physical restraints.   10/18: Seen with resident who wrote original consult note; agree with assessement and plan. Deferred ativan trial (pt was supposed to get ativan prior to MRI, was going to have nurse message to evaluate before/after does not appear to have gotten it). 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 Will likely do formal ativan challenge tomorrow if pt still unable to take PO - although agree with neurology that tx of underlying medical cause at this time is of primary importance, pts with catatonia 2/2 encephalitis can still often have some degree of response to standard treatments for catatonia such as lorazepam.   10/19: Patient appears less interactive today and did not appear to get agitated during visit. Patient had family at bedside who endorsed that patient's presentation continues to wax and wane. Overnight patient required assessment by primary team who noted increased rigidity in patient and patient was noted to have increased rigidity in her BLE and mild rigidity in her biceps. Patient scored a 10 on the Bush- Francis scale.  This is again concerning for catatonia. Patient is requiring physical restraints due episodes of hyperdelirium and there is concern that continued frequent use of these in conjunction with muscle rigidity could lead to muscle breakdown. It remains possible that patient's catatonic like presentation is 2/2 to an autoimmune encephalitis; however it may be beneficial to treat both underlying cause and symptomatic management of of more emergent catatonia symptoms to decrease chances of further decompensation.    Labs reviewed:CBC, CMP  EKG-QTC 420 LP-02/08/2021-HSV, oligoclonal bands, anti- nMDA and VZV pending; Protein-50 and Glucose 68, IgG 10.5, WBC 273   Plan Concern for catatonia (r/o autoimmune encephalitis, r/o MDD) -Autoimmune encephalitis work-up per neurology - ativan stopped by ?neurology 2/2 low concern for seizure, feeling this is encephalitis>catatonia -- Continue to recommend an Ativan challenge for catatonia (2mg  once and watch response if patient improves then continue with 1mg  Ativan q6h)  - Recommend  Zyprexa 2.5 mg twice daily, for severe agitation with goal of decreasing  time in restraints - EKG 10/18 Qtc 420 - Recommend CT Chest/ Abdomen/ Pelvis to investigate for possible pulmonary hamartoma or ovarian teratoma that could cause anti- NMDA encephalitis.   Thank you for this consult.  Psychiatry will continue to follow.  Total Time spent with patient: 20 minutes  Subjective:   Jamie [name] is a 20 y.o. female,  [place] female patient admitted with acute altered mental status and recent history of nausea and vomiting.  Patient has no known psychiatric history but does have a history of Hashimoto's thyroiditis.  HPI:   On assessment today patient is not able to give much history. Patient attempted to say her name, but was unable to say anything else and could not point to anything on the communication paper. Family friend in the room endorsed that patient did take some oatmeal PO earlier in the morning. Family friend reports that patient continues to have poor sleep regimen and is only sleeping in 30-45 min spurts and does not sleep often.   Brief BFCRS @ bedside (+) for mutism, immobility,staring, withdrawal - given that we are unable to assess delirium this is largely equivocal.    Past Medical History:  Past Medical History:  Diagnosis Date  . Acquired autoimmune hypothyroidism    Dx 12/2014, TSH 110, FT4 0.3   History reviewed. No pertinent surgical history. Family History:  Family History  Problem Relation Age of Onset  . Healthy Mother   . Healthy Father    Social History:  Social History   Substance and Sexual Activity  Alcohol Use Not Currently     Social History   Substance and Sexual Activity  Drug Use Not Currently    Social History   Socioeconomic History  . Marital status: Single    Spouse name: Not on file  . Number of children: Not on file  . Years of education: Not on file  . Highest education level: Not on file  Occupational History  . Not on file  Tobacco Use  . Smoking status: Never  . Smokeless  tobacco: Never  Substance and Sexual Activity  . Alcohol use: Not Currently  . Drug use: Not Currently  . Sexual activity: Not on file  Other Topics Concern  . Not on file  Social History Narrative   Lives at home with mom and maternal grandmother and two siblings attends [employer] school is in the 8th grade.    Social Determinants of Health   Financial Resource Strain: Not on file  Food Insecurity: Not on file  Transportation Needs: Not on file  Physical Activity: Not on file  Stress: Not on file  Social Connections: Not on file   Additional Social History:    Allergies:  No Known Allergies  Labs:  Results for orders placed or performed during the hospital encounter of 02/06/21 (from the past 48 hour(s))  CSF cell count with differential collection tube #: 1     Status: Abnormal   Collection Time: 02/08/21  3:31 PM  Result Value Ref Range   Tube # 1    Color, CSF COLORLESS COLORLESS   Appearance, CSF CLEAR (A) CLEAR   Supernatant NOT INDICATED    RBC Count, CSF 3 (H) 0 /cu mm   WBC, CSF 318 (HH) 0 - 5 /cu mm    Comment: CRITICAL RESULT CALLED TO, READ BACK BY AND VERIFIED WITH:  Cletis Athens, RN, 2026, 02/08/21, E.  ADEDOKUN.    Segmented Neutrophils-CSF 0 0 - 6 %   Lymphs, CSF 94 (H) 40 - 80 %   Monocyte-Macrophage-Spinal Fluid 6 (L) 15 - 45 %   Eosinophils, CSF 0 0 - 1 %    Comment: Performed at [facility] [address], [place], [place] [address]  CSF cell count with differential     Status: Abnormal   Collection Time: 02/08/21  3:31 PM  Result Value Ref Range   Tube # 4    Color, CSF COLORLESS COLORLESS   Appearance, CSF CLEAR (A) CLEAR   Supernatant NOT INDICATED    RBC Count, CSF 2 (H) 0 /cu mm   WBC, CSF 273 (HH) 0 - 5 /cu mm    Comment: CRITICAL RESULT CALLED TO, READ BACK BY AND VERIFIED WITH:  Cletis Athens, RN, 2025, 02/08/21, E. ADEDOKUN.    Segmented Neutrophils-CSF 0 0 - 6 %   Lymphs, CSF 92 (H) 40 - 80 %    Monocyte-Macrophage-Spinal Fluid 8 (L) 15 - 45 %   Eosinophils, CSF 0 0 - 1 %    Comment: Performed at [facility] [address] [place], [place] [address]  CSF culture w Gram Stain     Status: None (Preliminary result)   Collection Time: 02/08/21  3:31 PM   Specimen: CSF; Cerebrospinal Fluid  Result Value Ref Range   Specimen Description CSF    Special Requests NONE    Gram Stain      WBC PRESENT, PREDOMINANTLY MONONUCLEAR NO ORGANISMS SEEN CYTOSPIN SMEAR    Culture      NO GROWTH 2 DAYS Performed at [facility], [place] [address] [place], [place] [address]    Report Status PENDING   Protein and glucose, CSF     Status: Abnormal   Collection Time: 02/08/21  3:31 PM  Result Value Ref Range   Glucose, CSF 68 40 - 70 mg/dL   Total  Protein, CSF 50 (H) 15 - 45 mg/dL    Comment: Performed at [facility] [address], [place], [place] [address]  Culture, fungus without smear     Status: None (Preliminary result)   Collection Time: 02/08/21  3:31 PM   Specimen: CSF; Cerebrospinal Fluid  Result Value Ref Range   Specimen Description CSF    Special Requests NONE    Culture      NO FUNGUS ISOLATED AFTER 1 DAY Performed at [facility] [address] [place], [place] [address]    Report Status PENDING   IgG CSF index     Status: Abnormal   Collection Time: 02/08/21  3:31 PM  Result Value Ref Range   IgG, CSF 10.5 (H) 0.0 - 6.7 mg/dL   Albumin CSF-mCnc 26 7 - 29 mg/dL   IgG (Immunoglobin G), Serum 1,339 719 - 1,475 mg/dL   Albumin 4.5 3.9 - 5.0 g/dL   IgG/Alb Ratio, CSF 0.40 (H) 0.00 - 0.25   CSF IgG Index 1.4 (H) 0.0 - 0.7    Comment: (NOTE) Performed At: [facility] [place], [place] [id number] Rush Farmer MD [id number]   Pathologist smear review     Status: None   Collection Time: 02/08/21  3:31 PM  Result Value Ref Range   Path Review Lymphocytosis     Comment: Reviewed by Chrystie Nose. Saralyn Pilar,  M.D. 02/09/21 Performed at [facility], [place] [address] [place],  [address]  Pathologist smear review     Status: None   Collection Time: 02/08/21  3:31 PM  Result Value Ref Range   Path Review Lymphocytosis     Comment: 02/09/21 Reviewed by Chrystie Nose. Saralyn Pilar, M.D. Performed at [facility], [place] [address], [place], [place] [address]   CBC with Differential/Platelet     Status: None   Collection Time: 02/09/21  3:44 AM  Result Value Ref Range   WBC 5.0 4.0 - 10.5 K/uL   RBC 4.05 3.87 - 5.11 MIL/uL   Hemoglobin 12.1 12.0 - 15.0 g/dL   HCT 37.0 36.0 - 46.0 %   MCV 91.4 80.0 - 100.0 fL   MCH 29.9 26.0 - 34.0 pg   MCHC 32.7 30.0 - 36.0 g/dL   RDW 12.3 11.5 - 15.5 %   Platelets 240 150 - 400 K/uL   nRBC 0.0 0.0 - 0.2 %   Neutrophils Relative % 60 %   Neutro Abs 3.0 1.7 - 7.7 K/uL   Lymphocytes Relative 29 %   Lymphs Abs 1.4 0.7 - 4.0 K/uL   Monocytes Relative 10 %   Monocytes Absolute 0.5 0.1 - 1.0 K/uL   Eosinophils Relative 0 %   Eosinophils Absolute 0.0 0.0 - 0.5 K/uL   Basophils Relative 0 %   Basophils Absolute 0.0 0.0 - 0.1 K/uL   Immature Granulocytes 1 %   Abs Immature Granulocytes 0.03 0.00 - 0.07 K/uL    Comment: Performed at [facility], [address], [place], [place] [address]  Comprehensive metabolic panel     Status: Abnormal   Collection Time: 02/09/21  3:44 AM  Result Value Ref Range   Sodium 137 135 - 145 mmol/L   Potassium 4.0 3.5 - 5.1 mmol/L   Chloride 105 98 - 111 mmol/L   CO2 23 22 - 32 mmol/L   Glucose, Bld 107 (H) 70 - 99 mg/dL    Comment: Glucose reference range applies only to samples taken after fasting for at least 8 hours.   BUN 14 6 - 20 mg/dL   Creatinine, Ser 0.68 0.44 - 1.00 mg/dL   Calcium 9.4 8.9 - 10.3 mg/dL   Total Protein 7.0 6.5 - 8.1 g/dL   Albumin 3.7 3.5 - 5.0 g/dL   AST 23 15 - 41 U/L   ALT 9 0 - 44 U/L   Alkaline Phosphatase 29 (L) 38 - 126 U/L   Total Bilirubin 0.9 0.3 - 1.2 mg/dL   GFR,  Estimated >60 >60 mL/min    Comment: (NOTE) Calculated using the CKD-EPI Creatinine Equation (2021)    Anion gap 9 5 - 15    Comment: Performed at [facility] [address], [place], [place] [address]  Lipid panel     Status: Abnormal   Collection Time: 02/09/21  3:44 AM  Result Value Ref Range   Cholesterol 108 0 - 200 mg/dL   Triglycerides 31 <150 mg/dL   HDL 40 (L) >40 mg/dL   Total CHOL/HDL Ratio 2.7 RATIO   VLDL 6 0 - 40 mg/dL   LDL Cholesterol 62 0 - 99 mg/dL    Comment:        Total Cholesterol/HDL:CHD Risk Coronary Heart Disease Risk Table                     Men   Women  1/2 Average Risk   3.4   3.3  Average Risk  5.0   4.4  2 X Average Risk   9.6   7.1  3 X Average Risk  23.4   11.0        Use the calculated Patient Ratio above and the CHD Risk Table to determine the patient's CHD Risk.        ATP III CLASSIFICATION (LDL):  <100     mg/dL   Optimal  100-129  mg/dL   Near or Above                    Optimal  130-159  mg/dL   Borderline  160-189  mg/dL   High  >190     mg/dL   Very High Performed at [facility] [address] [place], [place] [address]   CBC with Differential/Platelet     Status: Abnormal   Collection Time: 02/10/21  3:44 AM  Result Value Ref Range   WBC 4.6 4.0 - 10.5 K/uL   RBC 4.17 3.87 - 5.11 MIL/uL   Hemoglobin 12.5 12.0 - 15.0 g/dL   HCT 38.0 36.0 - 46.0 %   MCV 91.1 80.0 - 100.0 fL   MCH 30.0 26.0 - 34.0 pg   MCHC 32.9 30.0 - 36.0 g/dL   RDW 12.4 11.5 - 15.5 %   Platelets 264 150 - 400 K/uL   nRBC 0.0 0.0 - 0.2 %   Neutrophils Relative % 89 %   Neutro Abs 4.1 1.7 - 7.7 K/uL   Lymphocytes Relative 10 %   Lymphs Abs 0.5 (L) 0.7 - 4.0 K/uL   Monocytes Relative 0 %   Monocytes Absolute 0.0 (L) 0.1 - 1.0 K/uL   Eosinophils Relative 0 %   Eosinophils Absolute 0.0 0.0 - 0.5 K/uL   Basophils Relative 0 %   Basophils Absolute 0.0 0.0 - 0.1 K/uL   nRBC 0 0 /100 WBC   Myelocytes 1 %   Abs Immature Granulocytes  0.00 0.00 - 0.07 K/uL    Comment: Performed at [facility], [place] [address] [place], [place] [address]  Comprehensive metabolic panel     Status: Abnormal   Collection Time: 02/10/21  3:44 AM  Result Value Ref Range   Sodium 135 135 - 145 mmol/L   Potassium 3.6 3.5 - 5.1 mmol/L   Chloride 102 98 - 111 mmol/L   CO2 23 22 - 32 mmol/L   Glucose, Bld 133 (H) 70 - 99 mg/dL    Comment: Glucose reference range applies only to samples taken after fasting for at least 8 hours.   BUN 17 6 - 20 mg/dL   Creatinine, Ser 0.57 0.44 - 1.00 mg/dL   Calcium 9.7 8.9 - 10.3 mg/dL   Total Protein 7.4 6.5 - 8.1 g/dL   Albumin 3.9 3.5 - 5.0 g/dL   AST 16 15 - 41 U/L   ALT 13 0 - 44 U/L   Alkaline Phosphatase 35 (L) 38 - 126 U/L   Total Bilirubin 0.5 0.3 - 1.2 mg/dL   GFR, Estimated >60 >60 mL/min    Comment: (NOTE) Calculated using the CKD-EPI Creatinine Equation (2021)    Anion gap 10 5 - 15    Comment: Performed at [facility] [address], [place], [place] [address]    Current Facility-Administered Medications  Medication Dose Route Frequency Provider Last Rate Last Admin  . acetaminophen (TYLENOL) tablet 650 mg  650 mg Oral Q6H PRN Maudie Mercury, MD   [phone]  mg at 02/07/21 2215   Or  . acetaminophen (TYLENOL) suppository 650 mg  650 mg Rectal Q6H PRN Maudie Mercury, MD      . levothyroxine (SYNTHROID, LEVOTHROID) injection 50 mcg  50 mcg Intravenous Daily Katsadouros, Vasilios, MD   50 mcg at 02/10/21 1057  . methylPREDNISolone sodium succinate (SOLU-MEDROL) 1,000 mg in sodium chloride 0.9 % 50 mL IVPB  1,000 mg Intravenous Daily Kerney Elbe, MD 66 mL/hr at 02/09/21 1648 1,000 mg at 02/09/21 1648  . potassium chloride 10 mEq in 100 mL IVPB  10 mEq Intravenous Q1 Hr x 4 Merrily Brittle, DO 100 mL/hr at 02/10/21 1353 10 mEq at 02/10/21 1353     Psychiatric Specialty Exam:  Presentation  General Appearance: Bizarre  Eye Contact:Poor (occasionally tracked conversation (ie when  speaking to nurse) but did not make eye contact when directly examined)  Speech: attempted to [facility] name but not true coherent speech, almost a whimper  Speech Volume: near mumble, low  Handedness:-- (friend at bedside unsure)   Mood and Affect  Mood:-- (unable to assess)  Affect: more flat today   Thought Process  Thought Processes:-- (unable to assess)  Descriptions of Associations:-- (unable to assess)  Orientation:-- (unable to assess)  Thought Content:-- (unable to assess)  History of Schizophrenia/Schizoaffective disorder:No data recorded Duration of Psychotic Symptoms:No data recorded Hallucinations:Hallucinations: -- (unable to assess, not overtly RIS)  Ideas of Reference:-- (mute, unable to assess)  Suicidal Thoughts:No data recorded Homicidal Thoughts:No data recorded  Sensorium  Memory:-- (unable to assess)  Judgment:-- (poor as evidenced by recent attempts at elopement)  Insight:-- (poor as evidenced by recent comments to nursing staff (prior to mutism))   Executive Functions  Concentration:-- (mute, unable to assess)  Attention Span:-- (mute, unable to assess)  Recall:-- (mute, unable to assess)  Fund of Knowledge:-- (mute, unable to assess)  Language:-- (mute, unable to assess)   Psychomotor Activity  Psychomotor Activity:Psychomotor Activity: -- (stereotypy (constantly moving hands, fidgeting with hair, gown, etc))   Assets  Assets:Social Support   Sleep  Sleep:Sleep: -- (did sleep per friend at bedside unknown amount)   Physical Exam: Physical Exam Constitutional:      Comments: Paitent is awake with eyes open. Sandria Senter exam catanoia scale score-10: Patient able to track her eyes a bit and attempts to say her name and attempted to follow some commands in her upper extremities when testing for mitgehen (anglepoise lamp). Patient had increased rigidity in bilateral LE and her bilateral biceps and is overall mute, immobile,  staring and neglecting oral intake most of the day.    HENT:     Head: Normocephalic.  Musculoskeletal:     Comments: Mild clonus 1+ in Bilateral ankle   Review of Systems  Psychiatric/Behavioral:  The patient has insomnia.        Family friends at bedside note that patient does not sleep much and when she does sleep even at night it is for 30-8min at a time.  Blood pressure 100/71, pulse 80, temperature 97.6 F (36.4 C), temperature source Axillary, resp. rate 20, last menstrual period 01/11/2021, SpO2 100 %. There is no height or weight on file to calculate BMI.   Pgy-2 Freida Busman, MD 02/10/2021 1:54 PM

## 2021-02-10 NOTE — Progress Notes (Signed)
IV team consulted to hang IVIG, upon arrival family at bedside waiting consent from patient's mother before hanging.

## 2021-02-10 NOTE — Progress Notes (Addendum)
HD 4 SUBJECTIVE:  Patient Summary: Jamie [name] is a 20 y.o. female, with a PMHx of Hashimoto thyroiditis, who presented to [facility] (10/15) for strange behavior, isolation, mutism, and N/V, then admitted for acute encephalopathy.  Overnight Events: Agitation continues to wax and wane. Mom called stating that she wants to hold off on IVIG therapy until she sees how patient responds to IV steroids. Night team discussed with mom rationale behind treatment plan. IVIG was held. Per chart and RN, patient ate all of her dinner.  Interim History: Jamie [name] was seen this AM alert, calm, without restraint, on 1:1 observation with sitter. Patient did not make eye contact when spoken to, no verbal response today, and did not follow any commands today.  On second encounter, met patient's mom and uncle at bedside, with aunt on phone. They expressed hesitancy of IVIG and wondering if patient's condition would resolve if the patient was at home. Discussed at length about the risk and benefits of treatment plans, and that patient would be best served to stay at the hospital, and receive IV treatments.  Discussed with mom rash findings, described below. She states that she has never seen them before and is unaware of what it could be.  OBJECTIVE:  Vital Signs: Vitals:   02/10/21 1958 02/10/21 2302 02/11/21 0815 02/11/21 0937  BP: (!) 104/59 95/75 (!) 87/62 100/66  Pulse: 84 88 69 78  Resp: _0 Temp: 99.2 F (37.3 C) 98.5 F (36.9 C) 99.2 F (37.3 C)   TempSrc: Oral Axillary Oral   SpO2: 100% 99% 99% 99%   Supplemental O2:  Room Air SpO2: 99 %  Physical Exam: Physical Exam Vitals and nursing note reviewed.  Constitutional:      General: She is awake.  HENT:     Head: Normocephalic and atraumatic.  Eyes:     General: No scleral icterus. Cardiovascular:     Rate and Rhythm: Normal rate and regular rhythm.     Heart sounds: Normal heart sounds. No murmur heard.   No  friction rub. No gallop.  Pulmonary:     Effort: Pulmonary effort is normal.  Musculoskeletal:     Right lower leg: No edema.     Left lower leg: No edema.  Skin:    General: Skin is warm and dry.     Comments: Dark brown, macular rash on thighs bilaterally that appear in rows  Neurological:     Mental Status: She is disoriented.     Comments: Has both arms flexed medially at rest.  Able to extend arms bilaterally, however there is inconsistent tension, as if patient is resisting arm extension. No tremors or cogwheel rigidity. Mild intermittent and transient facial grimacing noted that was not rhythmic or constant  Psychiatric:     Comments: She did not respond to any commands. Flat affect Completely mute today Hypoactive during encounter    Pertinent Labs: CBC Latest Ref Rng & Units 02/11/2021 02/10/2021 02/09/2021  WBC 4.0 - 10.5 K/uL 12.0(H) 4.6 5.0  Hemoglobin 12.0 - 15.0 g/dL 11.2(L) 12.5 12.1  Hematocrit 36.0 - 46.0 % 34.9(L) 38.0 37.0  Platelets 150 - 400 K/uL 314 264 240   CMP Latest Ref Rng & Units 02/11/2021 02/10/2021 02/09/2021  Glucose 70 - 99 mg/dL 129(H) 133(H) 107(H)  BUN 6 - 20 mg/dL _1 Creatinine 0.44 - 1.00 mg/dL 0.76 0.57 0.68  Sodium 135 - 145 mmol/L 140 135 137  Potassium 3.5 -  5.1 mmol/L 3.9 3.6 4.0  Chloride 98 - 111 mmol/L 106 102 105  CO2 22 - 32 mmol/L _0 Calcium 8.9 - 10.3 mg/dL 9.6 9.7 9.4  Total Protein 6.5 - 8.1 g/dL - 7.4 7.0  Total Bilirubin 0.3 - 1.2 mg/dL - 0.5 0.9  Alkaline Phos 38 - 126 U/L - 35(L) 29(L)  [name] 15 - 41 U/L - 16 23  ALT 0 - 44 U/L - 13 9   No results for input(s): GLUCAP in the last 72 hours. Pertinent Imaging: No results found. ASSESSMENT/PLAN:  Assessment: Active Problems:   Altered mental status  Jamie [name] is a 20 y.o. female, with a PMHx of Hashimoto thyroiditis, who presented to [facility] (10/15) for strange behavior, isolation, mutism, and N/V, then admitted for Acute  Encephalopathy work-up. HD 4  Plan: #Acute Encephalopathy c/b abnormal behavior, agitation. Day 3/7 of IV methylprednisolone (10/20) S/p LP, day 3 of IV methylprednisolone. CSF findings of clear fluid with leukocytosis and elevated protein. Fungal and bacterial cultures negative thus far. IgGs elevated. Discussed possibility of viral encephalitis with neurology whom feels it is less likely given EEG and CSF findings, as well as alternative diagnoses more likely. HSV and VZV PCR CSF negative. EBV/CMV pending. MRI incomplete read secondary to patient movement.  EEG consistent with encephalopathy, nonspecific.  Noted to eat all of her dinner with assistance last evening which is significant. No significant interaction on our exam today, continuing to have waxing/waning agitation, mutism/decreased language output. Ongoing diagnostic uncertainty in this case and appreciate the assistance of our colleagues in neurology and psychiatry in helping [place] work to find the best course for this young patient. 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 Multiple discussions have been had with neurology who are in agreement with this plan. 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  At times patient has appeared catatonic per psychiatry.  Discussed at length today and reviewed further studies/reviews of patients with encephalitis and features of catatonia and have improvement with scheduled benzodiazepines. They also expressed some concerns about rigidity noted on their exam and possible muscle damage since she is intermittently requiring restraints for agitation. Continuing to discuss daily with nursing staff to limit the amount of time patient is restraints and only use them when needed for patient safety. Will consider symptomatic  management of catatonic symptoms with scheduled ativan, per psych.  Discussed with mom, aunt, and uncle treatment plan of IV solumedrol + IVIG. Family wishes to hold off IVIG currently to see response of IV steroids. Per neurology, family tentatively consented to starting IVIG on 10/22. Continue 1:1 observation with sitter Continue to limit 2-point restraint as able to limit delirium Continue daily BMPs to assess kidney function IV methylprednisolone 1062m for 5 days per neurology (Start 10/18) Will tentatively plan to initiate IVIG therapy for 5 days on 10/22 Pending CSF cultures, oligoclonal bands, EBV CSF, CMV CSF, anti-NMDA CSF   #Dark brown macular rash First noted on 10/18 on bilateral thighs prior to administration of IV methylprednisolone. Mom states that she has never seen them before, and is unaware of what they could be. Unclear of etiology. We will e-consult dermatology.   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 Dx was prompted by amenorrhea. Was followed by ped endocrinologist, and recently became established with adult endocrinologist. Synthroid 100 MCG PO daily at home. Ongoing concern for med  adherence vs familial discord per chart review.  PO synthroid to IV equivalence per pharmacy, appreciate assistance. Will contact outpatient endocrinologist  Continue IV Synthroid 50 MCG daily    #Prolonged QTc, resolved Repeat QTc 420 (10/17). Also see rightward axis. QTc 528 on 10/15. We will hold on SGA's and any other prolonging agents. Monitor K+, Mg2+ levels and replete as necessary  #Hx of Vitamin D Insufficiency: Patient with a history of vitamin D deficiency, vitamin D 25-hydroxy level 29, 4 years ago. Initially appears to have been related to diet for which the patient followed with a dietitian, but does not appear to have been reevaluated. Repeat shows level of 29, essentially unchanged. We will consider vitamin D supplement.  #Home Stressors: Patient had  several [place] interviews over the past several years.  Her last appointment was in February 2021, at that time her GAD-7 score was 16, and PHQ 15 score was 7, which indicates a high anxiety level and low somatization level.  Additionally at home, evident her mother have disagreements over diet, her medication, education, and having a sibling with an intellectual disability and another with depression.  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 She is not on any current medications for anxiety. At bedside, her aunt notes that the patient is worried about her future endeavors, but does not want to be labeled as having "mental issues." given her symptoms of altered mental status today, with concern for a psychiatric component, psychiatry was consulted in the ED.   Best Practice: Diet: Normal Bowel: None Pain: Tylenol 649m PRN IVF/Rx: methylPREDNISolone (SOLU-MEDROL) injection, Last Rate: 1,000 mg (02/11/21 1142) Abx: None VTE: Place and maintain sequential compression device Start: 02/08/21 0847  Code: Full Code  PT/OT recs: Pending, none. TOC recs: Pending  Prior to Admission Living Arrangement: Home  Anticipated Discharge Location: Pending  Barriers to Discharge: Medical management  Dispo: Anticipated discharge in >2 days.  Signature: JMerrily Brittle DO Psychiatry Resident, PGY-1 [facility] Pager: [phone] PM, 02/11/2021  Please contact the on call pager after 5 pm and on weekends at [phone]

## 2021-02-10 NOTE — Progress Notes (Addendum)
Brief HPI: 20 y.o. 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 intake for 4 days and decreased verbalization 10/15 prior to arrival.  Initial work-up revealed a mildly elevated creatinine, TSH of 10.09, negative ETOH, and an abnormally elevated thyroperoxidase Ab of 268.    Excerpt from 2016 clinic note with Endocrinology: "Janeka was seen in her PCP office in September 2016 for her 14 year [facility] and to have a sports physical done. At that visit they discussed that she was not yet menarchal. Labs revealed hypothyroidism with TSH 110 and free T4 0.3 (obtained 01/18/2014). She was started on 25 mcg of Synthroid and referred to endocrinology for further evaluation and management.   Her first visit to [facility] was 02/23/2015, at which time TFTs had improved but continued to be abnormal (TSH 25.065, FT4 0.46 with elevated thyroglobulin Ab and TPO Ab).  Levothyroxine dose was increased to 61mcg daily at that time."   Per Hospitalist team, about 5 years ago, per records, the patient's anti-TPO Ab titer was > 900.  Subjective: One word answers to IM team earlier today.   Objective: Current vital signs: BP 111/82 (BP Location: Left Arm)   Pulse 79   Temp 98.2 F (36.8 C) (Axillary)   Resp 20   LMP 01/11/2021 Comment: neg preg test  SpO2 100%  Vital signs in last 24 hours: Temp:  [98.2 F (36.8 C)-98.8 F (37.1 C)] 98.2 F (36.8 C) (10/19 0007) Pulse Rate:  [79-105] 79 (10/19 0007) Resp:  [19-20] 20 (10/19 0007) BP: (96-111)/(68-82) 111/82 (10/19 0007) SpO2:  [99 %-100 %] 100 % (10/19 0007)  Intake/Output from previous day: 10/18 0701 - 10/19 0700 In: 366 [P.O.:300; IV Piggyback:66] Out: -  Intake/Output this shift: No intake/output data recorded. Nutritional status:  Diet Order             Diet regular Room service appropriate? Yes; Fluid consistency: Thin  Diet effective now                  Gen: Laying in bed  with sitter at bedside. Appears listless. NAD.   Psych: Affect is flat, with no evidence for an emotional response to any stimulus.  Ext: No edema   Neuro: Mental Status: Awake. No verbal output to any verbal or other stimuli. She did grimace when she started to urinate. Made occasional eye contact with a long stare. Does not follow any commands.  Cranial Nerves: PERRL, she briefly fixates and tracks examiner. Will direct eyes towards peripheral visual stimuli. No ptosis. Face is expressionless but symmetric.    Motor: Bradykinetic movements noted to upper and lower extremities in the context of minimal upper extremity movement and delayed withdrawal of BLE to noxious plantar stimulation. . Decreased tone throughout.  Sensory: Withdraws BLE to plantar stimulation.   DTR: Brisk 3+ and symmetric brachioradialis and patellae. Toes downgoing bilaterally.  Gait: Unable to assess  Lab Results: Results for orders placed or performed during the hospital encounter of 02/06/21 (from the past 48 hour(s))  CSF cell count with differential collection tube #: 1     Status: Abnormal   Collection Time: 02/08/21  3:31 PM  Result Value Ref Range   Tube # 1    Color, CSF COLORLESS COLORLESS   Appearance, CSF CLEAR (A) CLEAR   Supernatant NOT INDICATED    RBC Count, CSF 3 (H) 0 /cu mm   WBC, CSF 318 (HH) 0 -  5 /cu mm    Comment: CRITICAL RESULT CALLED TO, READ BACK BY AND VERIFIED WITH:  Cletis Athens, RN, 2026, 02/08/21, E. ADEDOKUN.    Segmented Neutrophils-CSF 0 0 - 6 %   Lymphs, CSF 94 (H) 40 - 80 %   Monocyte-Macrophage-Spinal Fluid 6 (L) 15 - 45 %   Eosinophils, CSF 0 0 - 1 %    Comment: Performed at [facility] [address] [place], [place] [address]  CSF cell count with differential     Status: Abnormal   Collection Time: 02/08/21  3:31 PM  Result Value Ref Range   Tube # 4    Color, CSF COLORLESS COLORLESS   Appearance, CSF CLEAR (A) CLEAR   Supernatant NOT INDICATED    RBC  Count, CSF 2 (H) 0 /cu mm   WBC, CSF 273 (HH) 0 - 5 /cu mm    Comment: CRITICAL RESULT CALLED TO, READ BACK BY AND VERIFIED WITH:  Cletis Athens, RN, 2025, 02/08/21, E. ADEDOKUN.    Segmented Neutrophils-CSF 0 0 - 6 %   Lymphs, CSF 92 (H) 40 - 80 %   Monocyte-Macrophage-Spinal Fluid 8 (L) 15 - 45 %   Eosinophils, CSF 0 0 - 1 %    Comment: Performed at [facility] [address], [place], [place] [address]  CSF culture w Gram Stain     Status: None (Preliminary result)   Collection Time: 02/08/21  3:31 PM   Specimen: CSF; Cerebrospinal Fluid  Result Value Ref Range   Specimen Description CSF    Special Requests NONE    Gram Stain      WBC PRESENT, PREDOMINANTLY MONONUCLEAR NO ORGANISMS SEEN CYTOSPIN SMEAR    Culture      NO GROWTH < 24 HOURS Performed at [facility], [place] [address] [place], [place] [address]    Report Status PENDING   Protein and glucose, CSF     Status: Abnormal   Collection Time: 02/08/21  3:31 PM  Result Value Ref Range   Glucose, CSF 68 40 - 70 mg/dL   Total  Protein, CSF 50 (H) 15 - 45 mg/dL    Comment: Performed at [facility] [address] [place], [place] [address]  Culture, fungus without smear     Status: None (Preliminary result)   Collection Time: 02/08/21  3:31 PM   Specimen: CSF; Cerebrospinal Fluid  Result Value Ref Range   Specimen Description CSF    Special Requests NONE    Culture      NO FUNGUS ISOLATED AFTER 1 DAY Performed at [facility] [address] [place], [place] [address]    Report Status PENDING   CBC with Differential/Platelet     Status: None   Collection Time: 02/09/21  3:44 AM  Result Value Ref Range   WBC 5.0 4.0 - 10.5 K/uL   RBC 4.05 3.87 - 5.11 MIL/uL   Hemoglobin 12.1 12.0 - 15.0 g/dL   HCT 37.0 36.0 - 46.0 %   MCV 91.4 80.0 - 100.0 fL   MCH 29.9 26.0 - 34.0 pg   MCHC 32.7 30.0 - 36.0 g/dL   RDW 12.3 11.5 - 15.5 %   Platelets 240 150 - 400 K/uL   nRBC 0.0 0.0 - 0.2 %    Neutrophils Relative % 60 %   Neutro Abs 3.0 1.7 - 7.7 K/uL   Lymphocytes Relative 29 %   Lymphs Abs 1.4 0.7 -  4.0 K/uL   Monocytes Relative 10 %   Monocytes Absolute 0.5 0.1 - 1.0 K/uL   Eosinophils Relative 0 %   Eosinophils Absolute 0.0 0.0 - 0.5 K/uL   Basophils Relative 0 %   Basophils Absolute 0.0 0.0 - 0.1 K/uL   Immature Granulocytes 1 %   Abs Immature Granulocytes 0.03 0.00 - 0.07 K/uL    Comment: Performed at [facility], [place] [address], [place], [place] [address]  Comprehensive metabolic panel     Status: Abnormal   Collection Time: 02/09/21  3:44 AM  Result Value Ref Range   Sodium 137 135 - 145 mmol/L   Potassium 4.0 3.5 - 5.1 mmol/L   Chloride 105 98 - 111 mmol/L   CO2 23 22 - 32 mmol/L   Glucose, Bld 107 (H) 70 - 99 mg/dL    Comment: Glucose reference range applies only to samples taken after fasting for at least 8 hours.   BUN 14 6 - 20 mg/dL   Creatinine, Ser 0.68 0.44 - 1.00 mg/dL   Calcium 9.4 8.9 - 10.3 mg/dL   Total Protein 7.0 6.5 - 8.1 g/dL   Albumin 3.7 3.5 - 5.0 g/dL   AST 23 15 - 41 U/L   ALT 9 0 - 44 U/L   Alkaline Phosphatase 29 (L) 38 - 126 U/L   Total Bilirubin 0.9 0.3 - 1.2 mg/dL   GFR, Estimated >60 >60 mL/min    Comment: (NOTE) Calculated using the CKD-EPI Creatinine Equation (2021)    Anion gap 9 5 - 15    Comment: Performed at [facility] [address] [place], [place] [address]  Lipid panel     Status: Abnormal   Collection Time: 02/09/21  3:44 AM  Result Value Ref Range   Cholesterol 108 0 - 200 mg/dL   Triglycerides 31 <150 mg/dL   HDL 40 (L) >40 mg/dL   Total CHOL/HDL Ratio 2.7 RATIO   VLDL 6 0 - 40 mg/dL   LDL Cholesterol 62 0 - 99 mg/dL    Comment:        Total Cholesterol/HDL:CHD Risk Coronary Heart Disease Risk Table                     Men   Women  1/2 Average Risk   3.4   3.3  Average Risk       5.0   4.4  2 X Average Risk   9.6   7.1  3 X Average Risk  23.4   11.0        Use the calculated Patient  Ratio above and the CHD Risk Table to determine the patient's CHD Risk.        ATP III CLASSIFICATION (LDL):  <100     mg/dL   Optimal  100-129  mg/dL   Near or Above                    Optimal  130-159  mg/dL   Borderline  160-189  mg/dL   High  >190     mg/dL   Very High Performed at [facility] [address], [place], [place] [address]   CBC with Differential/Platelet     Status: Abnormal   Collection Time: 02/10/21  3:44 AM  Result Value Ref Range   WBC 4.6 4.0 - 10.5 K/uL   RBC 4.17 3.87 - 5.11 MIL/uL   Hemoglobin 12.5 12.0 - 15.0  g/dL   HCT 38.0 36.0 - 46.0 %   MCV 91.1 80.0 - 100.0 fL   MCH 30.0 26.0 - 34.0 pg   MCHC 32.9 30.0 - 36.0 g/dL   RDW 12.4 11.5 - 15.5 %   Platelets 264 150 - 400 K/uL   nRBC 0.0 0.0 - 0.2 %   Neutrophils Relative % 89 %   Neutro Abs 4.1 1.7 - 7.7 K/uL   Lymphocytes Relative 10 %   Lymphs Abs 0.5 (L) 0.7 - 4.0 K/uL   Monocytes Relative 0 %   Monocytes Absolute 0.0 (L) 0.1 - 1.0 K/uL   Eosinophils Relative 0 %   Eosinophils Absolute 0.0 0.0 - 0.5 K/uL   Basophils Relative 0 %   Basophils Absolute 0.0 0.0 - 0.1 K/uL   nRBC 0 0 /100 WBC   Myelocytes 1 %   Abs Immature Granulocytes 0.00 0.00 - 0.07 K/uL    Comment: Performed at [facility], [place] [address], [place], [place] [address]  Comprehensive metabolic panel     Status: Abnormal   Collection Time: 02/10/21  3:44 AM  Result Value Ref Range   Sodium 135 135 - 145 mmol/L   Potassium 3.6 3.5 - 5.1 mmol/L   Chloride 102 98 - 111 mmol/L   CO2 23 22 - 32 mmol/L   Glucose, Bld 133 (H) 70 - 99 mg/dL    Comment: Glucose reference range applies only to samples taken after fasting for at least 8 hours.   BUN 17 6 - 20 mg/dL   Creatinine, Ser 0.57 0.44 - 1.00 mg/dL   Calcium 9.7 8.9 - 10.3 mg/dL   Total Protein 7.4 6.5 - 8.1 g/dL   Albumin 3.9 3.5 - 5.0 g/dL   AST 16 15 - 41 U/L   ALT 13 0 - 44 U/L   Alkaline Phosphatase 35 (L) 38 - 126 U/L   Total Bilirubin 0.5 0.3 - 1.2  mg/dL   GFR, Estimated >60 >60 mL/min    Comment: (NOTE) Calculated using the CKD-EPI Creatinine Equation (2021)    Anion gap 10 5 - 15    Comment: Performed at [facility] [address], [place], [place] [address]    Recent Results (from the past 240 hour(s))  Urine Culture     Status: Abnormal   Collection Time: 02/07/21  6:08 AM   Specimen: Urine, Clean Catch  Result Value Ref Range Status   Specimen Description URINE, CLEAN CATCH  Final   Special Requests   Final    NONE Performed at [facility], [address], [place], [place] [address]    Culture MULTIPLE SPECIES PRESENT, SUGGEST RECOLLECTION (A)  Final   Report Status 02/08/2021 FINAL  Final  Resp Panel by RT-PCR (Flu A&B, Covid) Nasopharyngeal Swab     Status: None   Collection Time: 02/07/21 10:12 AM   Specimen: Nasopharyngeal Swab; Nasopharyngeal(NP) swabs in vial transport medium  Result Value Ref Range Status   SARS Coronavirus 2 by RT PCR NEGATIVE NEGATIVE Final    Comment: (NOTE) SARS-CoV-2 target nucleic acids are NOT DETECTED.  The SARS-CoV-2 RNA is generally detectable in upper respiratory specimens during the acute phase of infection. The lowest concentration of SARS-CoV-2 viral copies this assay can detect is 138 copies/mL. A negative result does not preclude SARS-Cov-2 infection and should not be used as the sole basis for treatment or other patient management decisions. A negative result may occur with  improper specimen  collection/handling, submission of specimen other than nasopharyngeal swab, presence of viral mutation(s) within the areas targeted by this assay, and inadequate number of viral copies(<138 copies/mL). A negative result must be combined with clinical observations, patient history, and epidemiological information. The expected result is Negative.  Fact Sheet for Patients:  EntrepreneurPulse.com.au  Fact Sheet for Healthcare Providers:   IncredibleEmployment.be  This test is no t yet approved or cleared by the [place] FDA and  has been authorized for detection and/or diagnosis of SARS-CoV-2 by FDA under an Emergency Use Authorization (EUA). This EUA will remain  in effect (meaning this test can be used) for the duration of the COVID-19 declaration under Section 564(b)(1) of the Act, 21 U.S.C.section 360bbb-3(b)(1), unless the authorization is terminated  or revoked sooner.       Influenza A by PCR NEGATIVE NEGATIVE Final   Influenza B by PCR NEGATIVE NEGATIVE Final    Comment: (NOTE) The Xpert Xpress SARS-CoV-2/FLU/RSV plus assay is intended as an aid in the diagnosis of influenza from Nasopharyngeal swab specimens and should not be used as a sole basis for treatment. Nasal washings and aspirates are unacceptable for Xpert Xpress SARS-CoV-2/FLU/RSV testing.  Fact Sheet for Patients: EntrepreneurPulse.com.au  Fact Sheet for Healthcare Providers: IncredibleEmployment.be  This test is not yet approved or cleared by the [place] FDA and has been authorized for detection and/or diagnosis of SARS-CoV-2 by FDA under an Emergency Use Authorization (EUA). This EUA will remain in effect (meaning this test can be used) for the duration of the COVID-19 declaration under Section 564(b)(1) of the Act, 21 U.S.C. section 360bbb-3(b)(1), unless the authorization is terminated or revoked.  Performed at [facility], [place] [address], [place], [place] [address]   CSF culture w Gram Stain     Status: None (Preliminary result)   Collection Time: 02/08/21  3:31 PM   Specimen: CSF; Cerebrospinal Fluid  Result Value Ref Range Status   Specimen Description CSF  Final   Special Requests NONE  Final   Gram Stain   Final    WBC PRESENT, PREDOMINANTLY MONONUCLEAR NO ORGANISMS SEEN CYTOSPIN SMEAR    Culture   Final    NO GROWTH < 24 HOURS Performed at [facility], [place] [address], [place], [place] [address]    Report Status PENDING  Incomplete  Culture, fungus without smear     Status: None (Preliminary result)   Collection Time: 02/08/21  3:31 PM   Specimen: CSF; Cerebrospinal Fluid  Result Value Ref Range Status   Specimen Description CSF  Final   Special Requests NONE  Final   Culture   Final    NO FUNGUS ISOLATED AFTER 1 DAY Performed at [facility], [place] [address], [place],  [address]    Report Status PENDING  Incomplete    Lipid Panel Recent Labs    02/09/21 0344  CHOL 108  TRIG 31  HDL 40*  CHOLHDL 2.7  VLDL 6  LDLCALC 62    Studies/Results: MR BRAIN WO CONTRAST  Result Date: 02/09/2021 CLINICAL DATA:  Mental status change, unknown cause EXAM: MRI HEAD WITHOUT CONTRAST TECHNIQUE: Multiplanar, multiecho pulse sequences of the brain and surrounding structures were obtained without intravenous contrast. COMPARISON:  None. FINDINGS: Incomplete study due to patient tolerance. Only DWI/ADC, axial T2/FLAIR, coronal thins, and sagittal T1 obtained. Brain: No acute infarction, hemorrhage, hydrocephalus, extra-axial collection or mass lesion. Hippocampi are symmetric in size/signal within normal limits on coronal thin imaging. Vascular:  Major arterial flow voids are maintained at the skull base. Skull and upper cervical spine: Normal marrow signal. Sinuses/Orbits: Clear sinuses. Unremarkable orbits on this standard protocol MRI (non orbital protocol). Other: No mastoid effusions. IMPRESSION: Incomplete study due to patient intolerance without evidence of acute intracranial abnormality. Electronically Signed   By: Margaretha Sheffield M.D.   On: 02/09/2021 12:58   DG Pelvis Portable  Result Date: 02/09/2021 CLINICAL DATA:  20 year old female with altered mental status. Planned MRI today. Query retained metal foreign body. EXAM: PORTABLE PELVIS 1-2 VIEWS COMPARISON:  Portable chest and abdomen today. FINDINGS: Portable  AP supine view at 0931 hours. Visualized bowel gas pattern is non obstructed. Lower abdominal and pelvic visceral contours are within normal limits. EKG leads and wires. Questionable small retained metallic clip projecting over the mid right sacral ala. No other radiopaque foreign body identified. No acute osseous abnormality identified. IMPRESSION: Negative aside from a possible small metallic surgical clip in the right pelvis, which does not contraindicate MRI. Patient OK to proceed with MRI using standard instructions and precautions. Electronically Signed   By: Genevie Ann M.D.   On: 02/09/2021 09:45   DG CHEST PORT 1 VIEW  Result Date: 02/09/2021 CLINICAL DATA:  20 year old female with altered mental status. Planned MRI today. Query retained metal foreign body. EXAM: PORTABLE CHEST 1 VIEW COMPARISON:  None. FINDINGS: Portable AP semi upright view at 0928 hours. Normal lung volumes and mediastinal contours. Visualized tracheal air column is within normal limits. Allowing for portable technique the lungs are clear. Overlying EKG leads and wires. No retained radiopaque foreign body identified. No osseous abnormality identified. Negative visible upper abdomen. IMPRESSION: Negative portable chest.  No metallic foreign body identified. Electronically Signed   By: Genevie Ann M.D.   On: 02/09/2021 09:42   DG Abd Portable 1V  Result Date: 02/09/2021 CLINICAL DATA:  20 year old female with altered mental status. Planned MRI today. Query retained metal foreign body. EXAM: PORTABLE ABDOMEN - 1 VIEW COMPARISON:  Lumbar radiographs 08/13/2011. FINDINGS: Portable AP supine view at 0930 hours. Negative visible lung bases. Non obstructed bowel gas pattern. No osseous abnormality identified. Overlying EKG leads and wires. No radiopaque foreign body identified. IMPRESSION: No retained metallic foreign body in the abdomen. Electronically Signed   By: Genevie Ann M.D.   On: 02/09/2021 09:41   EEG adult  Result Date:  02/08/2021 Lora Havens, MD     02/08/2021  6:24 PM Patient Name: Jamie [name] MRN: [id number] Epilepsy Attending: Lora Havens Referring Physician/Provider: Dr Kerney Elbe Date: 10/17/ 2022 Duration: 23.31 mins Patient history:  20 y.o. female with PMH significant for Hashimoto thyroiditis who presents with vomiting, headache and not eating for 4 days and not talking since today. EEG to evaluate for seizure Level of alertness: Awake AEDs during EEG study: None Technical aspects: This EEG study was done with scalp electrodes positioned according to the 10-20 International system of electrode placement. Electrical activity was acquired at a sampling rate of 500Hz  and reviewed with a high frequency filter of 70Hz  and a low frequency filter of 1Hz . EEG data were recorded continuously and digitally stored. Description: EEG showed continuous generalized 3 to 6 Hz theta-delta slowing. Hyperventilation and photic stimulation were not performed.   ABNORMALITY - Continuous slow, generalized IMPRESSION: This study is suggestive of moderate diffuse encephalopathy, nonspecific etiology. No seizures or epileptiform discharges were seen throughout the recording. Priyanka [name]    Medications: Scheduled:  levothyroxine  50 mcg Intravenous Daily  Continuous:  methylPREDNISolone (SOLU-MEDROL) injection 1,000 mg (02/09/21 1648)   potassium chloride      EEG 10/17: This study is suggestive of moderate diffuse encephalopathy, nonspecific etiology. No seizures or epileptiform discharges were seen throughout the recording.   Assessment: 20 y.o. female with PMH significant for Hashimoto thyroiditis who presented to the ED 10/15 with vomiting, headache, and decreased PO intake for 4 days with decreased verbalization starting on 10/15. Has received 1/5 doses of daily IV methylprednisolone. Second dose pending for today.  - Examination reveals continued findings consistent with moderate to severe  encephalopathy versus catatonic state. - MRI brain:  No acute infarction, hemorrhage, hydrocephalus, extra-axial collection or mass lesion. Hippocampi are symmetric in size/signal within normal limits on coronal thin imaging. Incomplete study due to patient tolerance. Only DWI/ADC, axial T2/FLAIR, coronal thins, and sagittal T1 obtained.   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 Of note, approximately 80% of Hashimoto encephalopathy patients have an abnormal LP. CSF findings with lymphocytic pleocytosis, mildly elevated protein, and normal glucose. 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 However, she does not have clinically evident seizures or myoclonus.    - Given her young age and female gender, as well as clinical features, anti-NMDA receptor enceophalitis is also on the DDx. Per UpToDate, recommendations, IV Solumedrol plus either IVIG or PLEX is used for initial treatment.  - Multiple other paraneoplastic encephalitides are also documented in the literature and an extensive list of such was reviewed in UpToDate. The other syndromes either have features not consistent with this patient's presentation, or have a median age of occurrence that is significantly higher than the age of our patient (elderly populations).   - Per UpToDate: "Nonspecific electroencephalographic (EEG) abnormalities are seen in 90 to 98 percent of patients, usually demonstrating nonspecific slowing of background activity. Focal spikes or sharp waves and transient epileptic activity are less common. Triphasic waves and frontal intermittent rhythmic delta activity (FIRDA) have also been described." Patient's EEG revealed moderate diffuse encephalopathy with nonspecific etiology without seizures or epileptiform discharges.  - Regarding treatment, per UpToDate: "While  most patients (90 to 98 percent) respond to glucocorticoid therapy, recovery may be incomplete. In one series of 24 patients, only 32 percent obtained a complete response to steroids. Response to glucocorticoids was not predicted by clinical features or antibody levels. A small number (5 percent) of reported patients have been treated with other immunosuppressive medications, including azathioprine, cyclophosphamide, methotrexate, rituximab, and hydroxychloroquine. These are generally reserved for patients who cannot tolerate glucocorticoids, or those who do not respond to or relapse after or during tapering of glucocorticoid therapy. Clinical improvement with intravenous immune globulin and plasmapheresis has been reported in individual cases. - HIV nonreactive.  - CSF labs:  - Appearance: clear and colorless - WBC markedly elevated at 318 with 94% lymphocytes, 0 neutrophils - Protein elevated at 50 - IgG elevated at 10.5.  - IgG-albumin ratio elevated at 0.4.  - IgG index elevated at 1.4.   - CSF bacterial culture NG x 2 days  - Fungal culture: no fungus isolated after 1 day  - VZV PCR in process   Recommendations: - IM Team is sending out an NMDA Ab titer. Of note, this can be obtained on CSF, serum or both - Consider obtaining a CT of chest, abdomen and pelvis to assess for possible underlying mallignancy - Continue  Solu-Medrol therapy 1,000 mg IV daily for 5 days + PPI. Today is day 2. - Viral encephalitis felt to be low probability given lack of fever and Hashimoto's encephalopathy being significantly more likely. EEG findings not suggestive of HSV and CSF findings with small number of RBC most consistent with expected minimal venous blood in CSF sample rather than hemorrhage from HSV.  - Bacterial meningitis unlikely given no meningismus and no neutrophils in CSF sample - Neurology will continue to follow   Addendum: - Discussed addition of IVIG versus PLEX with IM Team given the  possibility of anti-NMDA receptor encephalitis.  - Agree with IM Team adding IVIG to her treatment regimen at 400 mg/kg qd x 5 days.     LOS: 3 days   @Electronically  signed: Dr. Kerney Elbe 02/10/2021  8:21 AM

## 2021-02-10 NOTE — Progress Notes (Signed)
Patient is confused, Alert and mute, not been able to follow command, trying to move towards the edge of the bed and removed her IV access. VSS

## 2021-02-10 NOTE — Progress Notes (Signed)
Provider paged to inform need renewal for bilateral wrist restraints.  Sitter present in room.

## 2021-02-10 NOTE — Progress Notes (Addendum)
HD#3 Subjective:  Overnight Events: No acute events overnight   Jamie [name] was in bed with bilateral wrist restraints with sitter bedside. When spoken to, she made occasional eye contact. She responded to two questions with "yea" but non-verbal otherwise.   Objective:  Vital signs in last 24 hours: Vitals:   02/09/21 2002 02/10/21 0007 02/10/21 0824 02/10/21 1225  BP: 107/77 111/82 105/75 100/71  Pulse: 95 79 74 80  Resp: 19 20 18 20   Temp: 98.3 F (36.8 C) 98.2 F (36.8 C) 98 F (36.7 C) 97.6 F (36.4 C)  TempSrc: Axillary Axillary Axillary Axillary  SpO2: 100% 100% 100% 100%   Supplemental O2: Room Air SpO2: 100 %   Physical Exam:  Constitutional: In bed, periodically agitated pulling at restraints.  HENT: normocephalic atraumatic Eyes: conjunctiva non-erythematous Neck: supple Cardiovascular: regular rate  Pulmonary/Chest: normal work of breathing on room air MSK: normal bulk and tone Neurological: Minimally verbal. Awake. Upper extremity strength intact.  No rigidity appreciated.  Responds to some verbal commands. Tracking when spoken to.  Skin: warm and dry Psych: flat affect  There were no vitals filed for this visit.   Intake/Output Summary (Last 24 hours) at 02/10/2021 1422 Last data filed at 02/09/2021 2100 Gross per 24 hour  Intake 366 ml  Output --  Net 366 ml   Net IO Since Admission: 1,246 mL [02/10/21 1422]  Pertinent Labs: CBC Latest Ref Rng & Units 02/10/2021 02/09/2021 02/08/2021  WBC 4.0 - 10.5 K/uL 4.6 5.0 5.0  Hemoglobin 12.0 - 15.0 g/dL 12.5 12.1 11.7(L)  Hematocrit 36.0 - 46.0 % 38.0 37.0 35.5(L)  Platelets 150 - 400 K/uL 264 240 238   CMP Latest Ref Rng & Units 02/10/2021 02/09/2021 02/08/2021  Glucose 70 - 99 mg/dL 133(H) 107(H) 112(H)  BUN 6 - 20 mg/dL 17 14 9   Creatinine 0.44 - 1.00 mg/dL 0.57 0.68 0.76  Sodium 135 - 145 mmol/L 135 137 134(L)  Potassium 3.5 - 5.1 mmol/L 3.6 4.0 3.7  Chloride 98 - 111 mmol/L 102 105  103  CO2 22 - 32 mmol/L 23 23 26   Calcium 8.9 - 10.3 mg/dL 9.7 9.4 9.2  Total Protein 6.5 - 8.1 g/dL 7.4 7.0 7.0  Total Bilirubin 0.3 - 1.2 mg/dL 0.5 0.9 0.6  Alkaline Phos 38 - 126 U/L 35(L) 29(L) 32(L)  AST 15 - 41 U/L 16 23 16   ALT 0 - 44 U/L 13 9 12    Imaging: No results found.  Assessment/Plan:   Active Problems:   Altered mental status  Patient Summary: Jamie [name] is a 20 y.o. with a pertinent PMH of hypothyroidism, who presented with altered mental status and admitted for encephalopathy.   Encephalopathy  S/p LP, day 2 of IV methylprednisolone. CSF findings of clear fluid with leukocytosis and elevated protein. Fungal and bacterial cultures negative thus far. IgGs elevated. Discussed possibility of viral encephalitis with neurology whom feels it is less likely given EEG and CSF findings, as well as alternative diagnoses more likely. MRI incomplete read secondary to patient movement.  EEG consistent with encephalopathy, nonspecific.  Patient with minimal improvement after first dose of IV steroids. Appears to be tracking to voice, using 1-2 words, and following some commands.  Ongoing diagnostic uncertainty in this case and appreciate the assistance of our colleagues in neurology and psychiatry in helping [place] work to find the best course for this young patient. 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  Multiple discussions have been had with neurology who are in agreement with this plan. 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 We will continue to consider this and attempt to obtain as able given patient fluctuating agitation.  At times patient has appeared catatonic per psychiatry.  Discussions had with them today, they shared that some studies have shown patients with encephalitis and  features of catatonia have improvement with scheduled benzodiazepines.  They also have some concerns about rigidity noted on their exam and possible muscle damage since she is intermittently requiring restraints for agitation. Continuing to discuss daily with nursing staff to limit the amount of time patient is restraints and only use them when needed for patient safety. Plan will be to hold off on Ativan trial for now as we would like to monitor her response to the IVIG and steroids.  -Greatly appreciate neurology and psychiatry's assistance in this case with diagnostic uncertainty. -IVIG 400 mg/kg day 1/5, IV methylprednisolone day 2/5.  -Continue to limit use of restraints.  No antipsychotics, prolonged QT on prior EKG. -Daily BMPs to assess kidney function -Pending CSF cultures, oligoclonal bands, EBV CSF, CMV CSF, HSV CSF, anti-NMDA CSF -HIV serum negative -Continue to discuss CT imaging at this time to assess for malignancy -Nutrition consulted, maintenance fluids 75 cc/h for 10 hours  Hypothyroidism Secondary to Hashimoto's thyroiditis diagnosed in 2016.  Per chart review there has been question if patient has been taking her levothyroxine consistently.  Her thyroid studies while lower than on diagnosis do have some consistencies with medication nonadherence.  Repeat labs she did have elevated TPO antibodies, however significantly less than when she was diagnosed. -Continue IV levothyroxine 50 mcg daily, half of home p.o. dose.  Diet: Normal IVF: LR 75 cc/h for 10 hours VTE: SCDs Code: Full PT/OT recs: Pending, none. Family Update: Family friends at bedside yesterday. Mother flying back into the country today.    Dispo: Anticipated discharge pending further workup and evaluation of her encephalopathy.   Sanjuana Letters DO Internal Medicine Resident PGY-2 Pager [phone] Please contact the on call pager after 5 pm and on weekends at [phone].

## 2021-02-10 NOTE — Progress Notes (Signed)
Pt aunt in room verbalized, pts mother is flying in from [place] but would like to hold off on the IVIG until mother arrives to talk doctor about treatment plan. Pts mother is currently in [place] going through customs. Will page on call provider to let them know.

## 2021-02-11 DIAGNOSIS — F061 Catatonic disorder due to known physiological condition: Secondary | ICD-10-CM

## 2021-02-11 LAB — CBC WITH DIFFERENTIAL/PLATELET
Abs Immature Granulocytes: 0.1 10*3/uL — ABNORMAL HIGH (ref 0.00–0.07)
Basophils Absolute: 0 10*3/uL (ref 0.0–0.1)
Basophils Relative: 0 %
Eosinophils Absolute: 0 10*3/uL (ref 0.0–0.5)
Eosinophils Relative: 0 %
HCT: 34.9 % — ABNORMAL LOW (ref 36.0–46.0)
Hemoglobin: 11.2 g/dL — ABNORMAL LOW (ref 12.0–15.0)
Immature Granulocytes: 1 %
Lymphocytes Relative: 8 %
Lymphs Abs: 0.9 10*3/uL (ref 0.7–4.0)
MCH: 29.4 pg (ref 26.0–34.0)
MCHC: 32.1 g/dL (ref 30.0–36.0)
MCV: 91.6 fL (ref 80.0–100.0)
Monocytes Absolute: 0.5 10*3/uL (ref 0.1–1.0)
Monocytes Relative: 5 %
Neutro Abs: 10.5 10*3/uL — ABNORMAL HIGH (ref 1.7–7.7)
Neutrophils Relative %: 86 %
Platelets: 314 10*3/uL (ref 150–400)
RBC: 3.81 MIL/uL — ABNORMAL LOW (ref 3.87–5.11)
RDW: 12.8 % (ref 11.5–15.5)
WBC: 12 10*3/uL — ABNORMAL HIGH (ref 4.0–10.5)
nRBC: 0 % (ref 0.0–0.2)

## 2021-02-11 LAB — BASIC METABOLIC PANEL
Anion gap: 9 (ref 5–15)
BUN: 19 mg/dL (ref 6–20)
CO2: 25 mmol/L (ref 22–32)
Calcium: 9.6 mg/dL (ref 8.9–10.3)
Chloride: 106 mmol/L (ref 98–111)
Creatinine, Ser: 0.76 mg/dL (ref 0.44–1.00)
GFR, Estimated: >60 mL/min (ref >60)
Glucose, Bld: 129 mg/dL — ABNORMAL HIGH (ref 70–99)
Potassium: 3.9 mmol/L (ref 3.5–5.1)
Sodium: 140 mmol/L (ref 135–145)

## 2021-02-11 LAB — VZV PCR, CSF: VZV PCR, CSF: NEGATIVE

## 2021-02-11 LAB — HSV 1/2 PCR, CSF
HSV-1 DNA: NEGATIVE
HSV-2 DNA: NEGATIVE

## 2021-02-11 LAB — OLIGOCLONAL BANDS, CSF + SERM

## 2021-02-11 MED ORDER — LACTATED RINGERS IV SOLN: INTRAVENOUS | Status: DC

## 2021-02-11 NOTE — Plan of Care (Signed)
Pt has been alert, non verbal and restless at times. Sitter and pts aunt were in room last night and verbalized that pt had moment where she was crying out. Pt noted pulling at restraints. Restraints loosened but then pt noted pulling at IV. Pt's mother will be in today.  Pt refused any PO intake last night. IVIG was discontinued until further conversation with pts mother regarding plan of care.   Problem: Safety: Goal: Non-violent Restraint(s) Outcome: Progressing   Problem: Education: Goal: Knowledge of General Education information will improve Description: Including pain rating scale, medication(s)/side effects and non-pharmacologic comfort measures Outcome: Progressing   Problem: Health Behavior/Discharge Planning: Goal: Ability to manage health-related needs will improve Outcome: Progressing   Problem: Clinical Measurements: Goal: Ability to maintain clinical measurements within normal limits will improve Outcome: Progressing Goal: Will remain free from infection Outcome: Progressing Goal: Diagnostic test results will improve Outcome: Progressing Goal: Respiratory complications will improve Outcome: Progressing Goal: Cardiovascular complication will be avoided Outcome: Progressing   Problem: Activity: Goal: Risk for activity intolerance will decrease Outcome: Progressing   Problem: Nutrition: Goal: Adequate nutrition will be maintained Outcome: Progressing   Problem: Coping: Goal: Level of anxiety will decrease Outcome: Progressing   Problem: Elimination: Goal: Will not experience complications related to bowel motility Outcome: Progressing Goal: Will not experience complications related to urinary retention Outcome: Progressing   Problem: Pain Managment: Goal: General experience of comfort will improve Outcome: Progressing   Problem: Safety: Goal: Ability to remain free from injury will improve Outcome: Progressing   Problem: Skin Integrity: Goal: Risk for  impaired skin integrity will decrease Outcome: Progressing

## 2021-02-11 NOTE — Progress Notes (Signed)
Pt mother is concerned about pts poor intake, she asked for provider to be informed and inquire about IV fluids.   Pt offered PO fluids, pt closes mouth and does not drink.

## 2021-02-11 NOTE — Progress Notes (Addendum)
Brief HPI: 20 y.o. 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 intake for 4 days and decreased verbalization 10/15 prior to arrival.  Initial work-up revealed a mildly elevated creatinine, TSH of 10.09, negative ETOH, and an abnormally elevated thyroperoxidase Ab of 268.    Excerpt from 2016 clinic note with Endocrinology: "Jamie [name] was seen in her PCP office in September 2016 for her 14 year [facility] and to have a sports physical done. At that visit they discussed that she was not yet menarchal. Labs revealed hypothyroidism with TSH 110 and free T4 0.3 (obtained 01/18/2014). She was started on 25 mcg of Synthroid and referred to endocrinology for further evaluation and management.   Her first visit to [facility] was 02/23/2015, at which time TFTs had improved but continued to be abnormal (TSH 25.065, FT4 0.46 with elevated thyroglobulin Ab and TPO Ab).  Levothyroxine dose was increased to 71mcg daily at that time."   Per Hospitalist team, about 5 years ago, per records, the patient's anti-TPO Ab titer was > 900.   Subjective: Patient can not participate in ROS as she is non verbal.   Objective: Current vital signs: BP 95/75 (BP Location: Left Arm)   Pulse 88   Temp 98.5 F (36.9 C) (Axillary)   Resp 20   LMP 01/11/2021 Comment: neg preg test  SpO2 99%  Vital signs in last 24 hours: Temp:  [97.6 F (36.4 C)-99.2 F (37.3 C)] 98.5 F (36.9 C) (10/19 2302) Pulse Rate:  [74-88] 88 (10/19 2302) Resp:  [18-20] 20 (10/19 2302) BP: (95-111)/(59-76) 95/75 (10/19 2302) SpO2:  [99 %-100 %] 99 % (10/19 2302)  Intake/Output from previous day: 10/19 0701 - 10/20 0700 In: 1470 [P.O.:720] Out: 1000 [Urine:1000] Intake/Output this shift: No intake/output data recorded. Nutritional status:  Diet Order             Diet regular Room service appropriate? Yes; Fluid consistency: Thin  Diet effective now                  General:  Non-diaphoretic, afebrile.  HEENT: [place]/AT. No nuchal rigidity.  Lungs: Respirations unlabored Ext: No edema Skin: A bizzare rash consisting of linear nonblanching hyperpigmented macules is seen symmetrically along her anterior and medial thighs bilaterally.The macules are oblate with long axes directed all in the same direction as the skin tension lines.   NEURO:  Mental Status: Patient is with an eyes-open, unresponsive state. Does not track or focus on people in room. She is non verbal, not following commands on initial NP exam, but see below. Marland Kitchen  Speech/Language: No speech.   Cranial Nerves:  PERRL. Eyelids elevated symmetrically. Follows no commands, so exam is difficult. Just stares forward and does not track. Eyelids elevate symmetrically. Face is symmetrical at rest. Motor/Sensory: Decreased tone throughout. Observed to spontaneously raise her head off pillow.  Observed her moving her right hand at times, spontaneously. On subsequent attending exam she squeezed examiner's hand weakly to command bilaterally and also exhibited some bradykinetic spontaneous upper extremity movements. Withdraws BLE to noxious plantar stimulation.  Reflexes: 3+ throughout Gait- Unable to assess  Lab Results: Results for orders placed or performed during the hospital encounter of 02/06/21 (from the past 48 hour(s))  HIV-1 RNA quant-no reflex-bld     Status: None   Collection Time: 02/09/21  2:18 PM  Result Value Ref Range   HIV 1 RNA Quant <20 copies/mL    Comment: (NOTE)  HIV-1 RNA not detected The reportable range for this assay is 20 to 10,000,000 copies HIV-1 RNA/mL. Performed At: [facility] [place], [place] [id number] Rush Farmer MD FY:[id number]    LOG10 HIV-1 RNA UNABLE TO CALCULATE log10copy/mL    Comment: (NOTE) Unable to calculate result since non-numeric result obtained for component test.   CBC with Differential/Platelet     Status: Abnormal   Collection Time:  02/10/21  3:44 AM  Result Value Ref Range   WBC 4.6 4.0 - 10.5 K/uL   RBC 4.17 3.87 - 5.11 MIL/uL   Hemoglobin 12.5 12.0 - 15.0 g/dL   HCT 38.0 36.0 - 46.0 %   MCV 91.1 80.0 - 100.0 fL   MCH 30.0 26.0 - 34.0 pg   MCHC 32.9 30.0 - 36.0 g/dL   RDW 12.4 11.5 - 15.5 %   Platelets 264 150 - 400 K/uL   nRBC 0.0 0.0 - 0.2 %   Neutrophils Relative % 89 %   Neutro Abs 4.1 1.7 - 7.7 K/uL   Lymphocytes Relative 10 %   Lymphs Abs 0.5 (L) 0.7 - 4.0 K/uL   Monocytes Relative 0 %   Monocytes Absolute 0.0 (L) 0.1 - 1.0 K/uL   Eosinophils Relative 0 %   Eosinophils Absolute 0.0 0.0 - 0.5 K/uL   Basophils Relative 0 %   Basophils Absolute 0.0 0.0 - 0.1 K/uL   nRBC 0 0 /100 WBC   Myelocytes 1 %   Abs Immature Granulocytes 0.00 0.00 - 0.07 K/uL    Comment: Performed at [facility], [address] , [place] [address]  Comprehensive metabolic panel     Status: Abnormal   Collection Time: 02/10/21  3:44 AM  Result Value Ref Range   Sodium 135 135 - 145 mmol/L   Potassium 3.6 3.5 - 5.1 mmol/L   Chloride 102 98 - 111 mmol/L   CO2 23 22 - 32 mmol/L   Glucose, Bld 133 (H) 70 - 99 mg/dL    Comment: Glucose reference range applies only to samples taken after fasting for at least 8 hours.   BUN 17 6 - 20 mg/dL   Creatinine, Ser 0.57 0.44 - 1.00 mg/dL   Calcium 9.7 8.9 - 10.3 mg/dL   Total Protein 7.4 6.5 - 8.1 g/dL   Albumin 3.9 3.5 - 5.0 g/dL   AST 16 15 - 41 U/L   ALT 13 0 - 44 U/L   Alkaline Phosphatase 35 (L) 38 - 126 U/L   Total Bilirubin 0.5 0.3 - 1.2 mg/dL   GFR, Estimated >60 >60 mL/min    Comment: (NOTE) Calculated using the CKD-EPI Creatinine Equation (2021)    Anion gap 10 5 - 15    Comment: Performed at [facility] [address], [place], [place] [address]    Recent Results (from the past 240 hour(s))  Urine Culture     Status: Abnormal   Collection Time: 02/07/21  6:08 AM   Specimen: Urine, Clean Catch  Result Value Ref Range Status   Specimen  Description URINE, CLEAN CATCH  Final   Special Requests   Final    NONE Performed at [facility], [address] [place], [place] [address]    Culture MULTIPLE SPECIES PRESENT, SUGGEST RECOLLECTION (A)  Final   Report Status 02/08/2021 FINAL  Final  Resp Panel by RT-PCR (Flu A&B, Covid) Nasopharyngeal Swab     Status: None   Collection Time: 02/07/21  10:12 AM   Specimen: Nasopharyngeal Swab; Nasopharyngeal(NP) swabs in vial transport medium  Result Value Ref Range Status   SARS Coronavirus 2 by RT PCR NEGATIVE NEGATIVE Final    Comment: (NOTE) SARS-CoV-2 target nucleic acids are NOT DETECTED.  The SARS-CoV-2 RNA is generally detectable in upper respiratory specimens during the acute phase of infection. The lowest concentration of SARS-CoV-2 viral copies this assay can detect is 138 copies/mL. A negative result does not preclude SARS-Cov-2 infection and should not be used as the sole basis for treatment or other patient management decisions. A negative result may occur with  improper specimen collection/handling, submission of specimen other than nasopharyngeal swab, presence of viral mutation(s) within the areas targeted by this assay, and inadequate number of viral copies(<138 copies/mL). A negative result must be combined with clinical observations, patient history, and epidemiological information. The expected result is Negative.  Fact Sheet for Patients:  EntrepreneurPulse.com.au  Fact Sheet for Healthcare Providers:  IncredibleEmployment.be  This test is no t yet approved or cleared by the [place] FDA and  has been authorized for detection and/or diagnosis of SARS-CoV-2 by FDA under an Emergency Use Authorization (EUA). This EUA will remain  in effect (meaning this test can be used) for the duration of the COVID-19 declaration under Section 564(b)(1) of the Act, 21 U.S.C.section 360bbb-3(b)(1), unless the authorization is  terminated  or revoked sooner.       Influenza A by PCR NEGATIVE NEGATIVE Final   Influenza B by PCR NEGATIVE NEGATIVE Final    Comment: (NOTE) The Xpert Xpress SARS-CoV-2/FLU/RSV plus assay is intended as an aid in the diagnosis of influenza from Nasopharyngeal swab specimens and should not be used as a sole basis for treatment. Nasal washings and aspirates are unacceptable for Xpert Xpress SARS-CoV-2/FLU/RSV testing.  Fact Sheet for Patients: EntrepreneurPulse.com.au  Fact Sheet for Healthcare Providers: IncredibleEmployment.be  This test is not yet approved or cleared by the [place] FDA and has been authorized for detection and/or diagnosis of SARS-CoV-2 by FDA under an Emergency Use Authorization (EUA). This EUA will remain in effect (meaning this test can be used) for the duration of the COVID-19 declaration under Section 564(b)(1) of the Act, 21 U.S.C. section 360bbb-3(b)(1), unless the authorization is terminated or revoked.  Performed at [facility], [place] [address], [place], [place] [address]   CSF culture w Gram Stain     Status: None (Preliminary result)   Collection Time: 02/08/21  3:31 PM   Specimen: CSF; Cerebrospinal Fluid  Result Value Ref Range Status   Specimen Description CSF  Final   Special Requests NONE  Final   Gram Stain   Final    WBC PRESENT, PREDOMINANTLY MONONUCLEAR NO ORGANISMS SEEN CYTOSPIN SMEAR    Culture   Final    NO GROWTH 3 DAYS Performed at [facility], [place] [address] [place], [place] [address]    Report Status PENDING  Incomplete  Culture, fungus without smear     Status: None (Preliminary result)   Collection Time: 02/08/21  3:31 PM   Specimen: CSF; Cerebrospinal Fluid  Result Value Ref Range Status   Specimen Description CSF  Final   Special Requests NONE  Final   Culture   Final    NO FUNGUS ISOLATED AFTER 2 DAYS Performed at [facility], [place] [address]  [place], [place] [address]    Report Status PENDING  Incomplete    Lipid Panel Recent Labs  02/09/21 0344  CHOL 108  TRIG 31  HDL 40*  CHOLHDL 2.7  VLDL 6  LDLCALC 62    Studies/Results: MR BRAIN WO CONTRAST  Result Date: 02/09/2021 CLINICAL DATA:  Mental status change, unknown cause EXAM: MRI HEAD WITHOUT CONTRAST TECHNIQUE: Multiplanar, multiecho pulse sequences of the brain and surrounding structures were obtained without intravenous contrast. COMPARISON:  None. FINDINGS: Incomplete study due to patient tolerance. Only DWI/ADC, axial T2/FLAIR, coronal thins, and sagittal T1 obtained. Brain: No acute infarction, hemorrhage, hydrocephalus, extra-axial collection or mass lesion. Hippocampi are symmetric in size/signal within normal limits on coronal thin imaging. Vascular: Major arterial flow voids are maintained at the skull base. Skull and upper cervical spine: Normal marrow signal. Sinuses/Orbits: Clear sinuses. Unremarkable orbits on this standard protocol MRI (non orbital protocol). Other: No mastoid effusions. IMPRESSION: Incomplete study due to patient intolerance without evidence of acute intracranial abnormality. Electronically Signed   By: Margaretha Sheffield M.D.   On: 02/09/2021 12:58   DG Pelvis Portable  Result Date: 02/09/2021 CLINICAL DATA:  20 year old female with altered mental status. Planned MRI today. Query retained metal foreign body. EXAM: PORTABLE PELVIS 1-2 VIEWS COMPARISON:  Portable chest and abdomen today. FINDINGS: Portable AP supine view at 0931 hours. Visualized bowel gas pattern is non obstructed. Lower abdominal and pelvic visceral contours are within normal limits. EKG leads and wires. Questionable small retained metallic clip projecting over the mid right sacral ala. No other radiopaque foreign body identified. No acute osseous abnormality identified. IMPRESSION: Negative aside from a possible small metallic surgical clip in the right pelvis, which does not  contraindicate MRI. Patient OK to proceed with MRI using standard instructions and precautions. Electronically Signed   By: Genevie Ann M.D.   On: 02/09/2021 09:45   DG CHEST PORT 1 VIEW  Result Date: 02/09/2021 CLINICAL DATA:  20 year old female with altered mental status. Planned MRI today. Query retained metal foreign body. EXAM: PORTABLE CHEST 1 VIEW COMPARISON:  None. FINDINGS: Portable AP semi upright view at 0928 hours. Normal lung volumes and mediastinal contours. Visualized tracheal air column is within normal limits. Allowing for portable technique the lungs are clear. Overlying EKG leads and wires. No retained radiopaque foreign body identified. No osseous abnormality identified. Negative visible upper abdomen. IMPRESSION: Negative portable chest.  No metallic foreign body identified. Electronically Signed   By: Genevie Ann M.D.   On: 02/09/2021 09:42   DG Abd Portable 1V  Result Date: 02/09/2021 CLINICAL DATA:  20 year old female with altered mental status. Planned MRI today. Query retained metal foreign body. EXAM: PORTABLE ABDOMEN - 1 VIEW COMPARISON:  Lumbar radiographs 08/13/2011. FINDINGS: Portable AP supine view at 0930 hours. Negative visible lung bases. Non obstructed bowel gas pattern. No osseous abnormality identified. Overlying EKG leads and wires. No radiopaque foreign body identified. IMPRESSION: No retained metallic foreign body in the abdomen. Electronically Signed   By: Genevie Ann M.D.   On: 02/09/2021 09:41    Medications: Scheduled:  levothyroxine  50 mcg Intravenous Daily   pantoprazole (PROTONIX) IV  40 mg Intravenous QHS   Continuous:  methylPREDNISolone (SOLU-MEDROL) injection 1,000 mg (02/10/21 1509)     Assessment: 20 y.o. female with PMH significant for Hashimoto thyroiditis who presented to the ED 10/15 with vomiting, headache, and decreased PO intake for 4 days with decreased verbalization starting on 10/15. Has received 2/5 doses of daily IV methylprednisolone. Third  dose today.  - Exam today is mildly improved from yesterday.  - MRI brain:  No  acute infarction, hemorrhage, hydrocephalus, extra-axial collection or mass lesion. Hippocampi are symmetric in size/signal within normal limits on coronal thin imaging. Incomplete study due to patient tolerance. Only DWI/ADC, axial T2/FLAIR, coronal thins, and sagittal T1 obtained.   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 Of note, approximately 80% of Hashimoto encephalopathy patients have an abnormal LP. CSF findings with lymphocytic pleocytosis, mildly elevated protein, and normal glucose. 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 However, she does not have clinically evident seizures or myoclonus.    - Given her young age and female gender, as well as clinical features, anti-NMDA receptor enceophalitis is also on the DDx. Per UpToDate, recommendations, IV Solumedrol plus either IVIG or PLEX is used for initial treatment.  - Multiple other paraneoplastic encephalitides are also documented in the literature and an extensive list of such was reviewed in UpToDate. The other syndromes either have features not consistent with this patient's presentation, or have a median age of occurrence that is significantly higher than the age of our patient (elderly populations).   - Per UpToDate: Nonspecific electroencephalographic (EEG) abnormalities are seen in 90 to 98 percent of Hashimoto encephalopathy patients, usually demonstrating nonspecific slowing of background activity; focal spikes or sharp waves and transient epileptic activity are less common; triphasic waves and frontal intermittent rhythmic delta activity (FIRDA) have also been described. The patient's EEG on 10/17 revealed moderate diffuse encephalopathy of nonspecific etiology without seizures or epileptiform  discharges.  - Regarding Hashimoto encephalopathy treatment, per UpToDate: "While most patients (90 to 98 percent) respond to glucocorticoid therapy, recovery may be incomplete. In one series of 24 patients, only 32 percent obtained a complete response to steroids. Response to glucocorticoids was not predicted by clinical features or antibody levels. A small number (5 percent) of reported patients have been treated with other immunosuppressive medications, including azathioprine, cyclophosphamide, methotrexate, rituximab, and hydroxychloroquine. These are generally reserved for patients who cannot tolerate glucocorticoids, or those who do not respond to or relapse after or during tapering of glucocorticoid therapy. Clinical improvement with intravenous immune globulin and plasmapheresis has been reported in individual cases." - HIV nonreactive.  - CSF labs:  - Appearance: clear and colorless - WBC markedly elevated at 318 with 94% lymphocytes, 0 neutrophils - Protein elevated at 50 - IgG elevated at 10.5.  - IgG-albumin ratio elevated at 0.4.  - IgG index elevated at 1.4.              - CSF bacterial culture NG x 3 days             - Fungal culture: no fungus isolated after 2 days             - VZV PCR in process   Recommendations: - IM Team was to send out an NMDA Ab titer. Of note, this can be obtained on CSF, serum or both. Result not found on chart.  A serum NMDA Ab titer was reordered by Neurology today as a send out to [facility].  - Consider obtaining a CT of chest, abdomen and pelvis to assess for possible underlying mallignancy - Continue Solu-Medrol therapy 1,000 mg IV daily for 5 days + PPI. Today is day 3. - On Wednesday (10/19), discussed addition of IVIG versus PLEX with IM Team given the possibility of anti-NMDA receptor encephalitis. The consensus between Neurology and IM Team is to  add IVIG to her treatment regimen at 400 mg/kg qd x 5 days. Family was initially refusing IVIG as it is  a blood product and they feel that blood products generally are forbidden by their religion (a protestant denomination in [place]); however, after further discussion with Neurology attending, the patient's mother tentatively agreed to start on Saturday or Sunday after completion of IV methylprednisolone (see below). .  - Viral encephalitis felt to be low probability given lack of fever and Hashimoto's encephalopathy being significantly more likely. EEG findings not suggestive of HSV and CSF findings with small number of RBC most consistent with expected minimal venous blood in CSF sample rather than hemorrhage from HSV.  - Bacterial meningitis unlikely given no meningismus and no neutrophils in CSF sample - Neurology will continue to follow   -NP had a long discussion > 30 minutes with aunt and mother of patient. The aunt interpreted for mother who is from [place]. Family asking if patient can go home today, as they feel like being with her mother will cure her. NP discouraged this as patient is no better and requiring total care here. Informed them if they take her home, it will be AMA. Discussed and explained the role of IVIG given this is an autoimmune issue. After much time of explaining and discussing IVIG, they still do not want to do it. When asked what their hesitancy with IVIG is, the reply is that they don't want things going into Jamie [name]'s body and that God will cure her.   -After NP visit, teaching service went in and spoke with family and got exact responses as NP. Per attending, they are refusing IVIG because it comes from someone else's blood. Teaching service attending states they may consider PLEX if someone will come and speak to them about process.   Addendum: - When patient was seen by Neurology attending following NP visit, family asked to discuss IVIG further. I explained how it is a blood product that contains pooled antibodies from multiple donors as well as mechanism of action as an  immunomodulatory agent. After education was provided, the patient's mother stated that she may consent to IVIG but would like to wait until Saturday for a decision, to see if the methylprednisolone has a more significant effect than it has as of today, which is day 3/5.  - Clinically, the patient is better today than yesterday but is still severely encephalopathic. She is able to squeeze examiner's hand to command but cannot make eye contact or track. She continues to be nonverbal. There is more spontaneous movement than yesterday, but still with significantly decreased motor activity relative to normal. She was observed to eat soft food when spoon fed by nurse tech; she is still unable to feed herself as of today's assessment.   A total of 60 minutes was spent in the neurological evaluation and management of the patient. Greater than 50% of time was spent educating patient's family regarding the diagnostic and treatment plans.    LOS: 4 days   @Electronically  signed: Dr. Kerney Elbe 02/11/2021  8:17 AM

## 2021-02-12 ENCOUNTER — Inpatient Hospital Stay (HOSPITAL_COMMUNITY): Payer: Medicaid Other

## 2021-02-12 DIAGNOSIS — G0490 Encephalitis and encephalomyelitis, unspecified: Secondary | ICD-10-CM

## 2021-02-12 DIAGNOSIS — F061 Catatonic disorder due to known physiological condition: Secondary | ICD-10-CM | POA: Diagnosis not present

## 2021-02-12 DIAGNOSIS — R4182 Altered mental status, unspecified: Secondary | ICD-10-CM | POA: Diagnosis not present

## 2021-02-12 LAB — CBC WITH DIFFERENTIAL/PLATELET
Abs Immature Granulocytes: 0.07 10*3/uL (ref 0.00–0.07)
Basophils Absolute: 0 10*3/uL (ref 0.0–0.1)
Basophils Relative: 0 %
Eosinophils Absolute: 0 10*3/uL (ref 0.0–0.5)
Eosinophils Relative: 0 %
HCT: 34.9 % — ABNORMAL LOW (ref 36.0–46.0)
Hemoglobin: 11.3 g/dL — ABNORMAL LOW (ref 12.0–15.0)
Immature Granulocytes: 1 %
Lymphocytes Relative: 9 %
Lymphs Abs: 0.8 10*3/uL (ref 0.7–4.0)
MCH: 29.7 pg (ref 26.0–34.0)
MCHC: 32.4 g/dL (ref 30.0–36.0)
MCV: 91.8 fL (ref 80.0–100.0)
Monocytes Absolute: 0.3 10*3/uL (ref 0.1–1.0)
Monocytes Relative: 3 %
Neutro Abs: 8.4 10*3/uL — ABNORMAL HIGH (ref 1.7–7.7)
Neutrophils Relative %: 87 %
Platelets: 321 10*3/uL (ref 150–400)
RBC: 3.8 MIL/uL — ABNORMAL LOW (ref 3.87–5.11)
RDW: 13 % (ref 11.5–15.5)
WBC: 9.5 10*3/uL (ref 4.0–10.5)
nRBC: 0 % (ref 0.0–0.2)

## 2021-02-12 LAB — MISC LABCORP TEST (SEND OUT): Labcorp test code: 505375

## 2021-02-12 LAB — COMPREHENSIVE METABOLIC PANEL
ALT: 13 U/L (ref 0–44)
AST: 21 U/L (ref 15–41)
Albumin: 3.5 g/dL (ref 3.5–5.0)
Alkaline Phosphatase: 32 U/L — ABNORMAL LOW (ref 38–126)
Anion gap: 7 (ref 5–15)
BUN: 23 mg/dL — ABNORMAL HIGH (ref 6–20)
CO2: 25 mmol/L (ref 22–32)
Calcium: 9.4 mg/dL (ref 8.9–10.3)
Chloride: 107 mmol/L (ref 98–111)
Creatinine, Ser: 0.8 mg/dL (ref 0.44–1.00)
GFR, Estimated: >60 mL/min (ref >60)
Glucose, Bld: 134 mg/dL — ABNORMAL HIGH (ref 70–99)
Potassium: 3.9 mmol/L (ref 3.5–5.1)
Sodium: 139 mmol/L (ref 135–145)
Total Bilirubin: 0.8 mg/dL (ref 0.3–1.2)
Total Protein: 6.7 g/dL (ref 6.5–8.1)

## 2021-02-12 LAB — CSF CULTURE W GRAM STAIN: Culture: NO GROWTH

## 2021-02-12 LAB — GLUCOSE, CAPILLARY
Glucose-Capillary: 152 mg/dL — ABNORMAL HIGH (ref 70–99)
Glucose-Capillary: 171 mg/dL — ABNORMAL HIGH (ref 70–99)

## 2021-02-12 LAB — MAGNESIUM: Magnesium: 2.6 mg/dL — ABNORMAL HIGH (ref 1.7–2.4)

## 2021-02-12 MED ORDER — LACTATED RINGERS IV SOLN: INTRAVENOUS | Status: AC

## 2021-02-12 MED ORDER — LORAZEPAM 2 MG/ML IJ SOLN
2.0000 mg | Freq: Four times a day (QID) | INTRAMUSCULAR | Status: DC
  Administered 2021-02-12 – 2021-02-14 (×6): 2 mg via INTRAVENOUS
  Filled 2021-02-12 (×6): qty 1

## 2021-02-12 MED ORDER — THIAMINE HCL 100 MG PO TABS: 100.0000 mg | ORAL_TABLET | Freq: Every day | ORAL | Status: DC

## 2021-02-12 MED ORDER — FREE WATER
30.0000 mL | Status: DC
  Administered 2021-02-12 – 2021-02-15 (×19): 30 mL

## 2021-02-12 MED ORDER — OSMOLITE 1.2 CAL PO LIQD
1000.0000 mL | ORAL | Status: DC
  Administered 2021-02-12 – 2021-02-13 (×2): 1000 mL
  Filled 2021-02-12: qty 1000

## 2021-02-12 MED ORDER — LORAZEPAM 2 MG/ML IJ SOLN
2.0000 mg | Freq: Once | INTRAMUSCULAR | Status: AC
  Administered 2021-02-12: 2 mg via INTRAVENOUS
  Filled 2021-02-12: qty 1

## 2021-02-12 MED ORDER — LACTATED RINGERS IV BOLUS
250.0000 mL | Freq: Once | INTRAVENOUS | Status: AC
  Administered 2021-02-12: 250 mL via INTRAVENOUS

## 2021-02-12 MED ORDER — THIAMINE HCL 100 MG/ML IJ SOLN
500.0000 mg | Freq: Three times a day (TID) | INTRAVENOUS | Status: AC
  Administered 2021-02-12 – 2021-02-15 (×9): 500 mg via INTRAVENOUS
  Filled 2021-02-12 (×9): qty 5

## 2021-02-12 NOTE — Procedures (Signed)
Cortrak  Tube Type:  Cortrak - 43 inches Tube Location:  Left nare Initial Placement:  Stomach Secured by: Bridle Technique Used to Measure Tube Placement:  Marking at nare/corner of mouth Cortrak Secured At:  65 cm  Cortrak Tube Team Note:  Consult received to place a Cortrak feeding tube.   X-ray is required, abdominal x-ray has been ordered by the Cortrak team. Please confirm tube placement before using the Cortrak tube.   If the tube becomes dislodged please keep the tube and contact the Cortrak team at [email or web address] (password [email or web address]) for replacement.  If after hours and replacement cannot be delayed, place a NG tube and confirm placement with an abdominal x-ray.    Dimitrius Steedman MS, RD, LDN Please refer to AMION for RD and/or RD on-call/weekend/after hours pager   

## 2021-02-12 NOTE — Progress Notes (Signed)
HD 5 SUBJECTIVE:  Patient Summary: [name] is a 20 y.o. female, with a PMHx of Hashimoto thyroiditis, who presented to [facility] (10/15) for strange behavior, isolation, mutism, and N/V, then admitted for acute encephalopathy.  Overnight Events: Agitation continues to wax and wane. She was started on IV LR due to poor PO intake. Mom stayed ON, per RN chart  Interim History: [name] was seen this AM awake, calm, without restraint, on 1:1 observation with sitter. However she is still mute and not able to cooperate with encounter. Unsure if patient is resisting ROM testing or is dystonic.  While performing the physical exam, patient will grimace.  Per sitter and mom at bedside, patient was able to sleep some last night.  However she did not eat anything.  She still continues to be mute and hypoactive.  OBJECTIVE:  Vital Signs: Vitals:   02/12/21 0432 02/12/21 0651 02/12/21 1142 02/12/21 1303  BP: 98/63 95/63 97/71  105/67  Pulse: (!) 55  (!) 56 63  Resp:   18 20  Temp:  98.3 F (36.8 C) 98.6 F (37 C) 98 F (36.7 C)  TempSrc:  Axillary Axillary Axillary  SpO2:   99% 99%   Supplemental O2:  Room Air SpO2: 99 %  Physical Exam: Physical Exam Vitals and nursing note reviewed.  Constitutional:      General: She is awake.     Appearance: She is not diaphoretic.     Comments: Awake  HENT:     Head: Normocephalic and atraumatic.     Mouth/Throat:     Comments: Drooling noted Eyes:     General: No scleral icterus.    Extraocular Movements: Extraocular movements intact.  Neck:     Comments: Patient's neck contracted, facing leftward Cardiovascular:     Rate and Rhythm: Regular rhythm. Bradycardia present.     Heart sounds: Normal heart sounds. No murmur heard.   No friction rub. No gallop.  Pulmonary:     Effort: Pulmonary effort is normal.  Abdominal:     General: There is no distension.     Tenderness: There is no abdominal tenderness. There is no  guarding.  Musculoskeletal:     Cervical back: Rigidity present.     Right lower leg: No edema.     Left lower leg: No edema.  Skin:    General: Skin is warm and dry.     Findings: Rash present.     Comments: Hyperpigmented macular rash on thighs bilaterally that appear in rows. See image below  Neurological:     Mental Status: She is disoriented.     Comments: Facial grimacing when testing ROM.  At rest, arms are relaxed and extended. When awake, arms are drawn medially and flexed.  No tremors or cogwheel rigidity on exam.   Psychiatric:     Comments: Hypoactive during encounter. Flat affect Completely mute       Pertinent Labs: CBC Latest Ref Rng & Units 02/12/2021 02/11/2021 02/10/2021  WBC 4.0 - 10.5 K/uL 9.5 12.0(H) 4.6  Hemoglobin 12.0 - 15.0 g/dL 11.3(L) 11.2(L) 12.5  Hematocrit 36.0 - 46.0 % 34.9(L) 34.9(L) 38.0  Platelets 150 - 400 K/uL 321 314 264   CMP Latest Ref Rng & Units 02/12/2021 02/11/2021 02/10/2021  Glucose 70 - 99 mg/dL 134(H) 129(H) 133(H)  BUN 6 - 20 mg/dL 23(H) 19 17  Creatinine 0.44 - 1.00 mg/dL 0.80 0.76 0.57  Sodium 135 - 145 mmol/L 139 140 135  Potassium 3.5 -  5.1 mmol/L 3.9 3.9 3.6  Chloride 98 - 111 mmol/L 107 106 102  CO2 22 - 32 mmol/L 25 25 23   Calcium 8.9 - 10.3 mg/dL 9.4 9.6 9.7  Total Protein 6.5 - 8.1 g/dL 6.7 - 7.4  Total Bilirubin 0.3 - 1.2 mg/dL 0.8 - 0.5  Alkaline Phos 38 - 126 U/L 32(L) - 35(L)  AST 15 - 41 U/L 21 - 16  ALT 0 - 44 U/L 13 - 13   No results for input(s): GLUCAP in the last 72 hours. Pertinent Imaging: No results found. ASSESSMENT/PLAN:  Assessment: Active Problems:   Altered mental status   Catatonia associated with another mental disorder  Jamie [name] is a 20 y.o. female, with a PMHx of Hashimoto thyroiditis, who presented to [facility] (10/15) for N/V, headaches, and behavioral change, then admitted for management of acute encephalitis. HD 5  Plan: #Acute Encephalitis  Day 4/7 of IV  methylprednisolone (start 10/18) S/p LP (10/17). Ddx: anti-NMDA-receptor encephalitis vs Hashimoto encephalitis. Although still considering and working up viral and other autoimmune etiology. CSF findings of clear fluid with leukocytosis, elevated protein, elevated IgGs and banding present. CSF Serum HIV negative. HSV and VZV PCR CSF are negative. EBV and CMV pending. MRI incomplete read secondary to patient movement. EEG consistent with encephalopathy, nonspecific. Fungal and bacterial cultures negative x3 days. Continuing to discuss daily with nursing staff to limit the amount of time patient is restraints and only use them when needed for patient safety. Symptomatic management of catatonic symptoms with scheduled ativan, per psychiatry. Per neurology, family consented to starting IVIG on 10/22. Patient also has had poor PO intake due to encephalopathy. Registered dietitian consulted, appreciate assistance and recommendation. RD recommended NG tube and tube feeds. Pending CXR to ensure proper tube placement. CT CAP w CON (10/21) for possible paraneoplastic causes of encephalitis. Continue 1:1 observation with sitter Continue to limit 2-point restraint as able to limit delirium Continue daily BMPs to assess kidney function Follow-up CXR for proper placement of NG tube Start NG tube feeds, per RD IV methylprednisolone 1000mg  for 7 days per neurology (Start 10/18) Plan to initiate IVIG therapy for 5 days on 10/22 CSF EBV and CMV Ig, CSF anti-NMDA Ig   #Catatonic Syndrome Ativan Trial Day 1 (start 10/21) Secondary to acute autoimmune vs viral vs paraneoplastic encephalitis. Consulted psychiatry, appreciate assistance and recommendations. 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  Patient seemed torticollic on exam today, with increased rigidity UE  bilaterally.  When testing ROM, patient would resist and grimace.  Will start Ativan trial, with goals of decrease muscle rigidity, increase engagement, and increase purposeful motor activity.  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 Dx was prompted by amenorrhea. Was followed by ped endocrinologist, and recently became established with adult endocrinologist. Synthroid 100 MCG PO daily at home. Ongoing concern for med adherence vs familial discord per chart review.  PO synthroid to IV equivalence per pharmacy, appreciate assistance. Was unable to contact outpatient endocrinologist, will try again.  Continue IV Synthroid 50 MCG daily  #Hyperpigmented Macular Rash First noted on 10/18 on bilateral thighs prior to administration of IV methylprednisolone. These appear oblong, chronic, and very symmetric.  See image above. Unclear of etiology and duration. Will obtain collateral from PCP and outpatient endocrinologist again. E-consulted dermatology [facility]), pending response.  Attempted to obtain collateral from patient's outpatient provider, however  was unsuccessful.  Will try again. Will try to obtain outpatient provider collateral again Follow-up dermatology's recommendations  #Prolonged QTc, resolved Repeat QTc 420 (10/17). Also see rightward axis. QTc 528 (10/15).  Monitor K+, Mg2+ levels and replete as necessary  #Hx of Vitamin D Insufficiency: Patient with a history of vitamin D deficiency, vitamin D 25-hydroxy level 29, 4 years ago. Initially appears to have been related to diet for which the patient followed with a dietitian, but does not appear to have been reevaluated. Repeat shows level of 29, essentially unchanged. We will consider vitamin D supplement.  Best Practice: Diet: Normal Bowel: None Pain: Tylenol 650mg  PRN IVF/Rx: methylPREDNISolone (SOLU-MEDROL) injection, Last Rate: 1,000 mg (02/12/21 0917) Abx: None VTE: Place and  maintain sequential compression device Start: 02/08/21 0847  Code: Full Code  PT/OT recs: Pending, none.  Prior to Admission Living Arrangement: Home  Anticipated Discharge Location: Pending  Barriers to Discharge: Medical management  Dispo: Anticipated discharge in >2 days.  Signature: Merrily Brittle, DO Psychiatry Resident, PGY-1 [facility] Pager: [phone] 1:59 PM, 02/12/2021  Please contact the on call pager after 5 pm and on weekends at [phone].

## 2021-02-12 NOTE — Progress Notes (Signed)
Initial Nutrition Assessment  DOCUMENTATION CODES:   Not applicable  INTERVENTION:   Osmolite 1.2 @65ml /hr- Initiate at 61ml/hr and increase by 17ml/hr q 8 hours until goal rate is reached.   Free water flushes 3ml q4 hours to maintain tube patency   Regimen provides 1872kcal/day, 87g/day protein and 1443ml/day free water   Pt at high refeed risk; recommend monitor potassium, magnesium and phosphorus labs daily until stable  NUTRITION DIAGNOSIS:   Inadequate oral intake related to acute illness (pt with AMS) as evidenced by meal completion < 25%.  GOAL:   Patient will meet greater than or equal to 90% of their needs  MONITOR:   PO intake, Labs, Weight trends, TF tolerance, Skin, I & O's  REASON FOR ASSESSMENT:   Consult Assessment of nutrition requirement/status  ASSESSMENT:   20 y.o. female with a PMHx of Hashimoto thyroiditis who is admitted with acute encephalopathy and catatonia.  Visited pt's room today. Pt is non-verbal and unable to provide any nutrition related history. Spoke with pt's mother via the phone. Mother reports pt with good appetite and oral intake at baseline. Spoke with pt's mother about the recommendation to place a small bore nasogastric feeding tube for nutrition; mother is ok to proceed with feeding tube as she feels pt needs it. Per chart, pt with nausea and vomiting that started 5 days pta. Sitter at bedside reports that pt has not been able to eat today; pt's lunch tray is sitting on her side able untouched. Per chart, pt is down 9lbs(7%) over the past month; this is significant. Spoke with MD, plan is for cortrak feeding tube placement and nutrition support today. Pt is at high refeed risk.   Medications reviewed and include: synthroid, protonix, solu-medrol   Labs reviewed: K 3.9 wnl, Mg 2.6(H)  NUTRITION - FOCUSED PHYSICAL EXAM:  Flowsheet Row Most Recent Value  Orbital Region No depletion  Upper Arm Region No depletion  Thoracic and  Lumbar Region No depletion  Buccal Region No depletion  Temple Region No depletion  Clavicle Bone Region Moderate depletion  Clavicle and Acromion Bone Region Moderate depletion  Scapular Bone Region No depletion  Dorsal Hand No depletion  Patellar Region No depletion  Anterior Thigh Region No depletion  Posterior Calf Region No depletion  Edema (RD Assessment) None  Hair Reviewed  Eyes Reviewed  Mouth Reviewed  Skin Reviewed  Nails Reviewed   Diet Order:   Diet Order             Diet regular Room service appropriate? Yes; Fluid consistency: Thin  Diet effective now                  EDUCATION NEEDS:   No education needs have been identified at this time  Skin:  Skin Assessment: Reviewed RN Assessment  Last BM:  pta  Height:   Ht Readings from Last 1 Encounters:  01/15/21 5' 2.02" (1.575 m) (19 %, Z= -0.89)*   * Growth percentiles are based on CDC (Girls, 2-20 Years) data.    Weight:   Wt Readings from Last 1 Encounters:  02/12/21 58.9 kg (54 %, Z= 0.11)*   * Growth percentiles are based on CDC (Girls, 2-20 Years) data.    Ideal Body Weight:  50 kg  BMI:  Body mass index is 23.73 kg/m.  Estimated Nutritional Needs:   Kcal:  1700-1900kcal/day  Protein:  85-95g/day  Fluid:  1.5-1.8L/day  Koleen Distance MS, RD, LDN Please refer to [facility] for RD  and/or RD on-call/weekend/after hours pager

## 2021-02-12 NOTE — Plan of Care (Signed)
Pt is alert, not responsive but pulls away at painful stimuli. Pt's mother is staying with patient tonight.  Pt fell asleep but woke up as staff cleaned her due to sheets getting soiled from urine. Pt cleaned, sheets and gown changed. Pt resting. IV fluids continued per order.   Problem: Safety: Goal: Non-violent Restraint(s) Outcome: Progressing   Problem: Education: Goal: Knowledge of General Education information will improve Description: Including pain rating scale, medication(s)/side effects and non-pharmacologic comfort measures Outcome: Progressing   Problem: Health Behavior/Discharge Planning: Goal: Ability to manage health-related needs will improve Outcome: Progressing   Problem: Clinical Measurements: Goal: Ability to maintain clinical measurements within normal limits will improve Outcome: Progressing Goal: Will remain free from infection Outcome: Progressing Goal: Diagnostic test results will improve Outcome: Progressing Goal: Respiratory complications will improve Outcome: Progressing Goal: Cardiovascular complication will be avoided Outcome: Progressing   Problem: Activity: Goal: Risk for activity intolerance will decrease Outcome: Progressing   Problem: Nutrition: Goal: Adequate nutrition will be maintained Outcome: Progressing   Problem: Elimination: Goal: Will not experience complications related to bowel motility Outcome: Progressing Goal: Will not experience complications related to urinary retention Outcome: Progressing   Problem: Pain Managment: Goal: General experience of comfort will improve Outcome: Progressing   Problem: Safety: Goal: Ability to remain free from injury will improve Outcome: Progressing

## 2021-02-12 NOTE — Progress Notes (Signed)
Pt mother stayed to sit with patient and ensure pt was not pulling at Iv. or or heart montior. Pt has been off of restraints all night.

## 2021-02-12 NOTE — Consult Note (Signed)
[facility] Face-to-Face Psychiatry Consult   Reason for Consult: Concern for catatonia  Referring Physician:  Gerlene Fee, MD Patient Identification: Jamie [name] MRN:  [id number] Principal Diagnosis: Encephalitis Diagnosis:  Principal Problem:   Encephalitis Active Problems:   Primary hypothyroidism   Catatonia associated with another mental disorder Assessment  Jamie [name] is a 20 y.o. female admitted medically for acute altered mental status with mutism with recent history of nausea and vomiting.  02/06/2021  2:17 PM patient carries no known past psychiatric history.  Patient carries known past medical history of Hashimoto's thyroiditis.  Psychiatry was consulted for acute change in altered mental status with concern for catatonia.  Patient was prescribed Synthroid outpatient; however, per EMR patient's mother endorsed patient had not been compliant.  On initial examination, patient is only able to answer very few questions with words and mostly responds with a high-pitched "hmm" as if she does not know what was asked. We plan to do an Ativan trial for possible catatonia as well as start low-dose antipsychotics to help with severe agitation that has been noted during hospitalization.   Patient had a family friend in room who endorsed the patient's mental status appears to wax and wane.  Increasing concern for delirium related to medical illness.  However, patient was able to endorse being in pain in her upper extremities.  We will attempt chemical restraints to help decrease frequency of severe agitation outbursts leading to physical restraints.   10/18: Seen with resident who wrote original consult note; agree with assessement and plan. Deferred ativan trial (pt was supposed to get ativan prior to MRI, was going to have nurse message to evaluate before/after does not appear to have gotten it). 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 Will likely do formal ativan challenge tomorrow if pt still unable to take PO - although agree with neurology that tx of underlying medical cause at this time is of primary importance, pts with catatonia 2/2 encephalitis can still often have some degree of response to standard treatments for catatonia such as lorazepam.    10/19: Patient appears less interactive today and did not appear to get agitated during visit. Patient had family at bedside who endorsed that patient's presentation continues to wax and wane. Overnight patient required assessment by primary team who noted increased rigidity in patient and patient was noted to have increased rigidity in her BLE and mild rigidity in her biceps. Patient scored a 10 on the Bush- Francis scale.  This is again concerning for catatonia. Patient is requiring physical restraints due episodes of hyperdelirium and there is concern that continued frequent use of these in conjunction with muscle rigidity could lead to muscle breakdown. It remains possible that patient's catatonic like presentation is 2/2 to an autoimmune encephalitis; however it may be beneficial to treat both underlying cause and symptomatic management of of more emergent catatonia symptoms to decrease chances of further decompensation.   10/21: Patient very difficult to arouse this AM. Attempted to see patient again in the PM. Patient was more responsive in the PM. Patient appeared to struggle to open her eyes, but appeared to attempt to interact to commands and let out a whimper. Patient had a Coretrack place as she has not PO intake in at least 2 days. BFCRS prior to Ativan 14. Post- Ativan: Patient opened  her eyes wider and able to maintain and blink, patient attempting to lift head and able to move both arms and squeeze both hands on command. Attempted to  communicate with patient via blinking; however this was inconsistent. Patient was also able to move L foot on command. Patient had decreased rigidity in BLE. Patient's positive response to Ativan trial remains convincing that patient is currently catatonic and this may be 2/2 encephalitis.    Labs reviewed:CBC, CMP  EKG-QTC 420 LP-02/08/2021-HSV- neg, oligoclonal bands- neg, anti- nMDA and VZV pending-neg; Protein-50 and Glucose 68, IgG 10.5, WBC 273   Plan Concern for catatonia (r/o autoimmune encephalitis, r/o MDD) -Autoimmune encephalitis work-up per neurology - Ativan 2mg  q6h, for catatonia - Recommend- Agitation protocol- Zyprexa 2.5mg  PRN (DO NOT GIVE within 30 min of Ativan due to risk of resp depression)  - EKG 10/18 Qtc 420 - CT Chest/ Abdomen/ Pelvis pending   Thank you for this consult.  Psychiatry will continue to follow.     Total Time spent with patient: 30 minutes  Subjective:   Jamie [name] is a 20 y.o. female,  [place] female patient admitted with acute altered mental status and recent history of nausea and vomiting.  Patient has no known psychiatric history but does have a history of Hashimoto's thyroiditis.   HPI: On assessment today patient remains unable to give much hx. Patient more difficult to arouse this AM and in the PM patient was arousing but had significant difficulty opening her eyes and appeared to attempt to move her R arm with intention. Unfortunately unable to contact patient's mother despite 2 attempts made via telephone.     Today was the first day patient was witnessed by provider sleeping.  Brief BFCRS approx 14 @ bedside (+) for mutism, immobility, withdrawal - given that we are unable to assess delirium this is largely equivocal. Patient did display some resistance to movement in arms. Post Ativan patient rigidity was significantly decreased and patient has mild improvement in responsiveness. Patient continues to have mutism and level  of immobility, as well as withdrawal w/ poor PO intake.    Past Medical History:  Past Medical History:  Diagnosis Date  . Acquired autoimmune hypothyroidism    Dx 12/2014, TSH 110, FT4 0.3   History reviewed. No pertinent surgical history. Family History:  Family History  Problem Relation Age of Onset  . Healthy Mother   . Healthy Father     Social History:  Social History   Substance and Sexual Activity  Alcohol Use Not Currently     Social History   Substance and Sexual Activity  Drug Use Not Currently    Social History   Socioeconomic History  . Marital status: Single    Spouse name: Not on file  . Number of children: Not on file  . Years of education: Not on file  . Highest education level: Not on file  Occupational History  . Not on file  Tobacco Use  . Smoking status: Never  . Smokeless tobacco: Never  Substance and Sexual Activity  . Alcohol use: Not Currently  . Drug use: Not Currently  . Sexual activity: Not on file  Other Topics Concern  . Not on file  Social History Narrative   Lives at home with mom and maternal grandmother and two siblings attends [employer] school is in the 8th grade.    Social Determinants of Health   Financial Resource Strain: Not on file  Food Insecurity: Not on file  Transportation Needs: Not on file  Physical Activity: Not on file  Stress: Not on file  Social Connections: Not on file   Additional Social History:    Allergies:  No Known Allergies  Labs:  Results for orders placed or performed during the hospital encounter of 02/06/21 (from the past 48 hour(s))  CBC with Differential/Platelet     Status: Abnormal   Collection Time: 02/11/21  9:26 AM  Result Value Ref Range   WBC 12.0 (H) 4.0 - 10.5 K/uL   RBC 3.81 (L) 3.87 - 5.11 MIL/uL   Hemoglobin 11.2 (L) 12.0 - 15.0 g/dL   HCT 34.9 (L) 36.0 - 46.0 %   MCV 91.6 80.0 - 100.0 fL   MCH 29.4 26.0 - 34.0 pg   MCHC 32.1 30.0 - 36.0 g/dL   RDW 12.8 11.5 -  15.5 %   Platelets 314 150 - 400 K/uL   nRBC 0.0 0.0 - 0.2 %   Neutrophils Relative % 86 %   Neutro Abs 10.5 (H) 1.7 - 7.7 K/uL   Lymphocytes Relative 8 %   Lymphs Abs 0.9 0.7 - 4.0 K/uL   Monocytes Relative 5 %   Monocytes Absolute 0.5 0.1 - 1.0 K/uL   Eosinophils Relative 0 %   Eosinophils Absolute 0.0 0.0 - 0.5 K/uL   Basophils Relative 0 %   Basophils Absolute 0.0 0.0 - 0.1 K/uL   Immature Granulocytes 1 %   Abs Immature Granulocytes 0.10 (H) 0.00 - 0.07 K/uL    Comment: Performed at [facility], [address], [place], [place] [address]  Basic metabolic panel     Status: Abnormal   Collection Time: 02/11/21  9:26 AM  Result Value Ref Range   Sodium 140 135 - 145 mmol/L   Potassium 3.9 3.5 - 5.1 mmol/L   Chloride 106 98 - 111 mmol/L   CO2 25 22 - 32 mmol/L   Glucose, Bld 129 (H) 70 - 99 mg/dL    Comment: Glucose reference range applies only to samples taken after fasting for at least 8 hours.   BUN 19 6 - 20 mg/dL   Creatinine, Ser 0.76 0.44 - 1.00 mg/dL   Calcium 9.6 8.9 - 10.3 mg/dL   GFR, Estimated >60 >60 mL/min    Comment: (NOTE) Calculated using the CKD-EPI Creatinine Equation (2021)    Anion gap 9 5 - 15    Comment: Performed at [facility] [address], [place], [place] [address]  Miscellaneous [facility] test (send-out)     Status: None   Collection Time: 02/11/21  9:26 AM  Result Value Ref Range   [facility] test code [id number]    [facility] test name Anti NMDA receptor antibody titer    Source ([facility]) RT SST     Comment: Performed at [facility], [place] [address], [place],  [address]   Misc [facility] result COMMENT     Comment: (NOTE) Performed At: [facility] [place], [place] [id number] Rush Farmer MD [id number]   CBC with Differential/Platelet     Status: Abnormal   Collection Time: 02/12/21  1:32 AM  Result Value Ref Range   WBC 9.5 4.0 - 10.5 K/uL   RBC 3.80 (L) 3.87 - 5.11 MIL/uL   Hemoglobin 11.3  (L) 12.0 - 15.0 g/dL   HCT 34.9 (L) 36.0 - 46.0 %   MCV 91.8 80.0 - 100.0 fL   MCH 29.7 26.0 - 34.0 pg   MCHC  32.4 30.0 - 36.0 g/dL   RDW 13.0 11.5 - 15.5 %   Platelets 321 150 - 400 K/uL   nRBC 0.0 0.0 - 0.2 %   Neutrophils Relative % 87 %   Neutro Abs 8.4 (H) 1.7 - 7.7 K/uL   Lymphocytes Relative 9 %   Lymphs Abs 0.8 0.7 - 4.0 K/uL   Monocytes Relative 3 %   Monocytes Absolute 0.3 0.1 - 1.0 K/uL   Eosinophils Relative 0 %   Eosinophils Absolute 0.0 0.0 - 0.5 K/uL   Basophils Relative 0 %   Basophils Absolute 0.0 0.0 - 0.1 K/uL   Immature Granulocytes 1 %   Abs Immature Granulocytes 0.07 0.00 - 0.07 K/uL    Comment: Performed at [facility] [address] [place], [place] [address]  Comprehensive metabolic panel     Status: Abnormal   Collection Time: 02/12/21  1:32 AM  Result Value Ref Range   Sodium 139 135 - 145 mmol/L   Potassium 3.9 3.5 - 5.1 mmol/L   Chloride 107 98 - 111 mmol/L   CO2 25 22 - 32 mmol/L   Glucose, Bld 134 (H) 70 - 99 mg/dL    Comment: Glucose reference range applies only to samples taken after fasting for at least 8 hours.   BUN 23 (H) 6 - 20 mg/dL   Creatinine, Ser 0.80 0.44 - 1.00 mg/dL   Calcium 9.4 8.9 - 10.3 mg/dL   Total Protein 6.7 6.5 - 8.1 g/dL   Albumin 3.5 3.5 - 5.0 g/dL   AST 21 15 - 41 U/L   ALT 13 0 - 44 U/L   Alkaline Phosphatase 32 (L) 38 - 126 U/L   Total Bilirubin 0.8 0.3 - 1.2 mg/dL   GFR, Estimated >60 >60 mL/min    Comment: (NOTE) Calculated using the CKD-EPI Creatinine Equation (2021)    Anion gap 7 5 - 15    Comment: Performed at [facility] [address], [place], [place] [address]  Magnesium     Status: Abnormal   Collection Time: 02/12/21  1:32 AM  Result Value Ref Range   Magnesium 2.6 (H) 1.7 - 2.4 mg/dL    Comment: Performed at [facility] [address], [place], [place] [address]    Current Facility-Administered Medications  Medication Dose Route Frequency Provider Last Rate Last Admin   . acetaminophen (TYLENOL) tablet 650 mg  650 mg Oral Q6H PRN Maudie Mercury, MD   650 mg at 02/07/21 2215   Or  . acetaminophen (TYLENOL) suppository 650 mg  650 mg Rectal Q6H PRN Maudie Mercury, MD      . feeding supplement (OSMOLITE 1.2 CAL) liquid 1,000 mL  1,000 mL Per Tube Continuous Axel Filler, MD      . free water 30 mL  30 mL Per Tube Q4H Axel Filler, MD      . levothyroxine (SYNTHROID, LEVOTHROID) injection 50 mcg  50 mcg Intravenous Daily Katsadouros, Vasilios, MD   50 mcg at 02/12/21 0831  . LORazepam (ATIVAN) injection 2 mg  2 mg Intravenous Once Cinderella, Margaret A      . methylPREDNISolone sodium succinate (SOLU-MEDROL) 1,000 mg in sodium chloride 0.9 % 50 mL IVPB  1,000 mg Intravenous Daily Kerney Elbe, MD 66 mL/hr at 02/12/21 0917 1,000 mg at 02/12/21 0917  . pantoprazole (PROTONIX) injection 40 mg  40 mg Intravenous QHS Katsadouros, Vasilios, MD   40 mg at 02/11/21 2155  Psychiatric Specialty Exam: Patient currently mute.  Presentation  General Appearance: Bizarre  Eye Contact:Poor (occasionally tracked conversation (ie when speaking to nurse) but did not make eye contact when directly examined)  Speech:-- (no coherent speech during entirety of exam)  Speech Volume:-- (did not speak)  Handedness:-- (friend at bedside unsure)   Mood and Affect  Mood:-- (unable to assess)  Affect:-- (confused, scared)   Thought Process  Thought Processes:-- (unable to assess)  Descriptions of Associations:-- (unable to assess)  Orientation:-- (unable to assess)  Thought Content:-- (unable to assess)  History of Schizophrenia/Schizoaffective disorder:No data recorded Duration of Psychotic Symptoms:No data recorded Hallucinations:No data recorded Ideas of Reference:-- (mute, unable to assess)  Suicidal Thoughts:No data recorded Homicidal Thoughts:No data recorded  Sensorium  Memory:-- (unable to assess)  Judgment:-- (poor as evidenced  by recent attempts at elopement)  Insight:-- (poor as evidenced by recent comments to nursing staff (prior to mutism))   Executive Functions  Concentration:-- (mute, unable to assess)  Attention Span:-- (mute, unable to assess)  Recall:-- (mute, unable to assess)  Fund of Knowledge:-- (mute, unable to assess)  Language:-- (mute, unable to assess)   Psychomotor Activity  Psychomotor Activity:No data recorded  Assets  Assets:Social Support   Sleep  Sleep:No data recorded  Physical Exam: Physical Exam Constitutional:      Comments: Unable to open eyes on initial assessment. On second assessment patient appears to struggle to open her eyes but is not able to get them all the open. Post Ativan trial:   HENT:     Head: Normocephalic and atraumatic.  Musculoskeletal:     Comments: Increased rigidity noted in BLE. Post ativan rigidity decreased in BLE.  Skin:    General: Skin is warm and dry.     Comments: Patient has rash across her legs. Image in Internal medicine resident's notes.   ROS Blood pressure 105/67, pulse 63, temperature 98 F (36.7 C), temperature source Axillary, resp. rate 20, weight 58.9 kg, last menstrual period 01/11/2021, SpO2 99 %. Body mass index is 23.73 kg/m.   PGY-2 Freida Busman, MD 02/12/2021 2:36 PM

## 2021-02-12 NOTE — Progress Notes (Signed)
Providers infromed of BPs running in 90's.

## 2021-02-12 NOTE — Progress Notes (Addendum)
Brief HPI: 20 y.o. 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 intake for 4 days and decreased verbalization 10/15 prior to arrival.  Initial work-up revealed a mildly elevated creatinine, TSH of 10.09, negative ETOH, and an abnormally elevated thyroperoxidase Ab of 268.    Excerpt from 2016 clinic note with Endocrinology: "Ayse was seen in her PCP office in September 2016 for her 14 year [facility] and to have a sports physical done. At that visit they discussed that she was not yet menarchal. Labs revealed hypothyroidism with TSH 110 and free T4 0.3 (obtained 01/18/2014). She was started on 25 mcg of Synthroid and referred to endocrinology for further evaluation and management.   Her first visit to [facility] was 02/23/2015, at which time TFTs had improved but continued to be abnormal (TSH 25.065, FT4 0.46 with elevated thyroglobulin Ab and TPO Ab).  Levothyroxine dose was increased to 40mcg daily at that time."   Per Hospitalist team, about 5 years ago, per records, the patient's anti-TPO Ab titer was > 900.  Subjective: Slept off and on during the night. Currently sleeping well per mother (8:00 AM assessment).   Objective: Current vital signs: BP 95/63 (BP Location: Left Arm)   Pulse (!) 55   Temp 98.3 F (36.8 C) (Axillary)   Resp 16   LMP 01/11/2021 Comment: neg preg test  SpO2 99%  Vital signs in last 24 hours: Temp:  [97.4 F (36.3 C)-98.3 F (36.8 C)] 98.3 F (36.8 C) (10/21 0651) Pulse Rate:  [55-81] 55 (10/21 0432) Resp:  [15-18] 16 (10/21 0401) BP: (89-108)/(57-77) 95/63 (10/21 0651) SpO2:  [98 %-100 %] 99 % (10/21 0401)  Intake/Output from previous day: 10/20 0701 - 10/21 0700 In: 768.6 [P.O.:90; I.V.:678.6] Out: 650 [Urine:650] Intake/Output this shift: No intake/output data recorded. Nutritional status:  Diet Order             Diet regular Room service appropriate? Yes; Fluid consistency: Thin  Diet  effective now                  Gen: Non-diaphoretic, afebrlie HEENT: No nuchal rigidity.  Ext: No edema Skin: Nonblanching hyperpigmented rash as noted yesterday  Neurologic Exam: Ment: The patient is initially asleep. Slow to arouse. She moves mouth slightly after attempts to arouse. Not following commands in the drowsy state. Deferred sustained noxious stimuli this AM. Continues to be nonverbal.  CN: PERRL. Did not track or fixated. Face symmetric with decreased tone.  Motor: Decreased tone x 4. Did not move BUE to command. Withdrew BLE, moderately briskly, to noxious plantar stimulation without asymmetry.  Sensory: Reacts to noxious plantar stimulation.  Reflexes: Toes downgoing bilaterally Cerebellar/Gait: Unable to assess  Lab Results: Results for orders placed or performed during the hospital encounter of 02/06/21 (from the past 48 hour(s))  CBC with Differential/Platelet     Status: Abnormal   Collection Time: 02/11/21  9:26 AM  Result Value Ref Range   WBC 12.0 (H) 4.0 - 10.5 K/uL   RBC 3.81 (L) 3.87 - 5.11 MIL/uL   Hemoglobin 11.2 (L) 12.0 - 15.0 g/dL   HCT 34.9 (L) 36.0 - 46.0 %   MCV 91.6 80.0 - 100.0 fL   MCH 29.4 26.0 - 34.0 pg   MCHC 32.1 30.0 - 36.0 g/dL   RDW 12.8 11.5 - 15.5 %   Platelets 314 150 - 400 K/uL   nRBC 0.0 0.0 - 0.2 %  Neutrophils Relative % 86 %   Neutro Abs 10.5 (H) 1.7 - 7.7 K/uL   Lymphocytes Relative 8 %   Lymphs Abs 0.9 0.7 - 4.0 K/uL   Monocytes Relative 5 %   Monocytes Absolute 0.5 0.1 - 1.0 K/uL   Eosinophils Relative 0 %   Eosinophils Absolute 0.0 0.0 - 0.5 K/uL   Basophils Relative 0 %   Basophils Absolute 0.0 0.0 - 0.1 K/uL   Immature Granulocytes 1 %   Abs Immature Granulocytes 0.10 (H) 0.00 - 0.07 K/uL    Comment: Performed at [facility] [address], [place], [place] [address]  Basic metabolic panel     Status: Abnormal   Collection Time: 02/11/21  9:26 AM  Result Value Ref Range   Sodium 140 135 - 145 mmol/L    Potassium 3.9 3.5 - 5.1 mmol/L   Chloride 106 98 - 111 mmol/L   CO2 25 22 - 32 mmol/L   Glucose, Bld 129 (H) 70 - 99 mg/dL    Comment: Glucose reference range applies only to samples taken after fasting for at least 8 hours.   BUN 19 6 - 20 mg/dL   Creatinine, Ser 0.76 0.44 - 1.00 mg/dL   Calcium 9.6 8.9 - 10.3 mg/dL   GFR, Estimated >60 >60 mL/min    Comment: (NOTE) Calculated using the CKD-EPI Creatinine Equation (2021)    Anion gap 9 5 - 15    Comment: Performed at [facility] [address] [place], [place] [address]  CBC with Differential/Platelet     Status: Abnormal   Collection Time: 02/12/21  1:32 AM  Result Value Ref Range   WBC 9.5 4.0 - 10.5 K/uL   RBC 3.80 (L) 3.87 - 5.11 MIL/uL   Hemoglobin 11.3 (L) 12.0 - 15.0 g/dL   HCT 34.9 (L) 36.0 - 46.0 %   MCV 91.8 80.0 - 100.0 fL   MCH 29.7 26.0 - 34.0 pg   MCHC 32.4 30.0 - 36.0 g/dL   RDW 13.0 11.5 - 15.5 %   Platelets 321 150 - 400 K/uL   nRBC 0.0 0.0 - 0.2 %   Neutrophils Relative % 87 %   Neutro Abs 8.4 (H) 1.7 - 7.7 K/uL   Lymphocytes Relative 9 %   Lymphs Abs 0.8 0.7 - 4.0 K/uL   Monocytes Relative 3 %   Monocytes Absolute 0.3 0.1 - 1.0 K/uL   Eosinophils Relative 0 %   Eosinophils Absolute 0.0 0.0 - 0.5 K/uL   Basophils Relative 0 %   Basophils Absolute 0.0 0.0 - 0.1 K/uL   Immature Granulocytes 1 %   Abs Immature Granulocytes 0.07 0.00 - 0.07 K/uL    Comment: Performed at [facility], [place] [address] [place], [place] [address]  Comprehensive metabolic panel     Status: Abnormal   Collection Time: 02/12/21  1:32 AM  Result Value Ref Range   Sodium 139 135 - 145 mmol/L   Potassium 3.9 3.5 - 5.1 mmol/L   Chloride 107 98 - 111 mmol/L   CO2 25 22 - 32 mmol/L   Glucose, Bld 134 (H) 70 - 99 mg/dL    Comment: Glucose reference range applies only to samples taken after fasting for at least 8 hours.   BUN 23 (H) 6 - 20 mg/dL   Creatinine, Ser 0.80 0.44 - 1.00 mg/dL   Calcium 9.4 8.9 - 10.3  mg/dL   Total Protein 6.7 6.5 - 8.1 g/dL  Albumin 3.5 3.5 - 5.0 g/dL   AST 21 15 - 41 U/L   ALT 13 0 - 44 U/L   Alkaline Phosphatase 32 (L) 38 - 126 U/L   Total Bilirubin 0.8 0.3 - 1.2 mg/dL   GFR, Estimated >60 >60 mL/min    Comment: (NOTE) Calculated using the CKD-EPI Creatinine Equation (2021)    Anion gap 7 5 - 15    Comment: Performed at [facility] [address] [place], [place] [address]  Magnesium     Status: Abnormal   Collection Time: 02/12/21  1:32 AM  Result Value Ref Range   Magnesium 2.6 (H) 1.7 - 2.4 mg/dL    Comment: Performed at [facility] [address], [place], [place] [address]    Recent Results (from the past 240 hour(s))  Urine Culture     Status: Abnormal   Collection Time: 02/07/21  6:08 AM   Specimen: Urine, Clean Catch  Result Value Ref Range Status   Specimen Description URINE, CLEAN CATCH  Final   Special Requests   Final    NONE Performed at [facility], [address], [place], [place] [address]    Culture MULTIPLE SPECIES PRESENT, SUGGEST RECOLLECTION (A)  Final   Report Status 02/08/2021 FINAL  Final  Resp Panel by RT-PCR (Flu A&B, Covid) Nasopharyngeal Swab     Status: None   Collection Time: 02/07/21 10:12 AM   Specimen: Nasopharyngeal Swab; Nasopharyngeal(NP) swabs in vial transport medium  Result Value Ref Range Status   SARS Coronavirus 2 by RT PCR NEGATIVE NEGATIVE Final    Comment: (NOTE) SARS-CoV-2 target nucleic acids are NOT DETECTED.  The SARS-CoV-2 RNA is generally detectable in upper respiratory specimens during the acute phase of infection. The lowest concentration of SARS-CoV-2 viral copies this assay can detect is 138 copies/mL. A negative result does not preclude SARS-Cov-2 infection and should not be used as the sole basis for treatment or other patient management decisions. A negative result may occur with  improper specimen collection/handling, submission of specimen other than  nasopharyngeal swab, presence of viral mutation(s) within the areas targeted by this assay, and inadequate number of viral copies(<138 copies/mL). A negative result must be combined with clinical observations, patient history, and epidemiological information. The expected result is Negative.  Fact Sheet for Patients:  EntrepreneurPulse.com.au  Fact Sheet for Healthcare Providers:  IncredibleEmployment.be  This test is no t yet approved or cleared by the [place] FDA and  has been authorized for detection and/or diagnosis of SARS-CoV-2 by FDA under an Emergency Use Authorization (EUA). This EUA will remain  in effect (meaning this test can be used) for the duration of the COVID-19 declaration under Section 564(b)(1) of the Act, 21 U.S.C.section 360bbb-3(b)(1), unless the authorization is terminated  or revoked sooner.       Influenza A by PCR NEGATIVE NEGATIVE Final   Influenza B by PCR NEGATIVE NEGATIVE Final    Comment: (NOTE) The Xpert Xpress SARS-CoV-2/FLU/RSV plus assay is intended as an aid in the diagnosis of influenza from Nasopharyngeal swab specimens and should not be used as a sole basis for treatment. Nasal washings and aspirates are unacceptable for Xpert Xpress SARS-CoV-2/FLU/RSV testing.  Fact Sheet for Patients: EntrepreneurPulse.com.au  Fact Sheet for Healthcare Providers: IncredibleEmployment.be  This test is not yet approved or cleared by the [place] FDA and has been authorized for detection and/or diagnosis of SARS-CoV-2 by FDA under an Emergency Use Authorization (EUA). This  EUA will remain in effect (meaning this test can be used) for the duration of the COVID-19 declaration under Section 564(b)(1) of the Act, 21 U.S.C. section 360bbb-3(b)(1), unless the authorization is terminated or revoked.  Performed at [facility], [place] [address], [place], [place] [address]    CSF culture w Gram Stain     Status: None (Preliminary result)   Collection Time: 02/08/21  3:31 PM   Specimen: CSF; Cerebrospinal Fluid  Result Value Ref Range Status   Specimen Description CSF  Final   Special Requests NONE  Final   Gram Stain   Final    WBC PRESENT, PREDOMINANTLY MONONUCLEAR NO ORGANISMS SEEN CYTOSPIN SMEAR    Culture   Final    NO GROWTH 3 DAYS Performed at [facility], [place] [address] [place], [place] [address]    Report Status PENDING  Incomplete  Culture, fungus without smear     Status: None (Preliminary result)   Collection Time: 02/08/21  3:31 PM   Specimen: CSF; Cerebrospinal Fluid  Result Value Ref Range Status   Specimen Description CSF  Final   Special Requests NONE  Final   Culture   Final    NO FUNGUS ISOLATED AFTER 3 DAYS Performed at [facility], [place] [address], [place], [place] [address]    Report Status PENDING  Incomplete  VZV PCR, CSF     Status: None   Collection Time: 02/08/21  3:31 PM   Specimen: Cerebrospinal Fluid  Result Value Ref Range Status   VZV PCR, CSF Negative Negative Final    Comment: (NOTE) No Varicella Zoster Virus DNA detected. Performed At: [facility] [place], [place] [id number] Rush Farmer MD RW:[id number]     Lipid Panel No results for input(s): CHOL, TRIG, HDL, CHOLHDL, VLDL, LDLCALC in the last 72 hours.  Studies/Results: No results found.  Medications: Scheduled:  levothyroxine  50 mcg Intravenous Daily   pantoprazole (PROTONIX) IV  40 mg Intravenous QHS   Continuous:  lactated ringers     methylPREDNISolone (SOLU-MEDROL) injection 1,000 mg (02/11/21 1142)   Assessment: 20 y.o. female with PMH significant for Hashimoto thyroiditis who presented to the ED 10/15 with vomiting, headache, and decreased PO intake for 4 days with decreased verbalization starting on 10/15. Has received 3/5 doses of daily IV methylprednisolone. Fourth dose today.  - Exam today is  mildly improved from yesterday.  - MRI brain:  No acute infarction, hemorrhage, hydrocephalus, extra-axial collection or mass lesion. Hippocampi are symmetric in size/signal within normal limits on coronal thin imaging. Incomplete study due to patient tolerance. Only DWI/ADC, axial T2/FLAIR, coronal thins, and sagittal T1 obtained.   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 Of note, approximately 80% of Hashimoto encephalopathy patients have an abnormal LP. CSF findings with lymphocytic pleocytosis, mildly elevated protein, and normal glucose. 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 However, she does not have clinically evident seizures or myoclonus.    - Given her young age and female gender, as well as clinical features, anti-NMDA receptor enceophalitis is also on the DDx. Per UpToDate, recommendations, IV Solumedrol plus either IVIG or PLEX is used for initial treatment.  - Multiple other paraneoplastic encephalitides are also documented in the literature and an extensive list of such was reviewed in UpToDate. The other syndromes either have features not  consistent with this patient's presentation, or have a median age of occurrence that is significantly higher than the age of our patient (elderly populations).   - Per UpToDate: Nonspecific electroencephalographic (EEG) abnormalities are seen in 90 to 98 percent of Hashimoto encephalopathy patients, usually demonstrating nonspecific slowing of background activity; focal spikes or sharp waves and transient epileptic activity are less common; triphasic waves and frontal intermittent rhythmic delta activity (FIRDA) have also been described. The patient's EEG on 10/17 revealed moderate diffuse encephalopathy of nonspecific etiology without seizures or epileptiform discharges.  - Regarding  Hashimoto encephalopathy treatment, per UpToDate: "While most patients (90 to 98 percent) respond to glucocorticoid therapy, recovery may be incomplete. In one series of 24 patients, only 32 percent obtained a complete response to steroids. Response to glucocorticoids was not predicted by clinical features or antibody levels. A small number (5 percent) of reported patients have been treated with other immunosuppressive medications, including azathioprine, cyclophosphamide, methotrexate, rituximab, and hydroxychloroquine. These are generally reserved for patients who cannot tolerate glucocorticoids, or those who do not respond to or relapse after or during tapering of glucocorticoid therapy. Clinical improvement with intravenous immune globulin and plasmapheresis has been reported in individual cases." - HIV nonreactive.  - CSF labs:  - Appearance: clear and colorless - WBC markedly elevated at 318 with 94% lymphocytes, 0 neutrophils - Protein elevated at 50 - IgG elevated at 10.5.  - IgG-albumin ratio elevated at 0.4.  - IgG index elevated at 1.4.              - CSF bacterial culture NG x 3 days             - Fungal culture: no fungus isolated after 2 days             - VZV PCR in process   Recommendations: - A serum NMDA Ab titer was reordered by Neurology yesterday (Thursday) as a send out to [facility].  - CT of chest, abdomen and pelvis with contrast to assess for possible underlying malignancy is pending.  - Continue Solu-Medrol therapy 1,000 mg IV daily for 5 days + PPI. Today is day 4. - On Wednesday (10/19), discussed addition of IVIG versus PLEX with IM Team given the possibility of anti-NMDA receptor encephalitis. The consensus between Neurology and IM Team is to add IVIG to her treatment regimen at 400 mg/kg qd x 5 days. Family was initially refusing IVIG as it is a blood product and they feel that blood products generally are forbidden by their religion ([place] protest[place] denomination [place]); however, after further discussion with Neurology attending, the patient's mother tentatively agreed to start on Saturday or Sunday after completion of IV methylprednisolone (see below). .  - Viral encephalitis felt to be low probability given lack of fever and Hashimoto's encephalopathy being significantly more likely. EEG findings not suggestive of HSV and CSF findings with small number of RBC most consistent with expected minimal venous blood in CSF sample rather than hemorrhage from HSV.  - Bacterial meningitis unlikely given no meningismus and no neutrophils in CSF sample - I explained to the patient's mother yesterday how IVIG is a blood product that contains pooled antibodies from multiple donors as well as mechanism of action as an immunomodulatory agent. After education was provided, the patient's mother stated that she may consent to IVIG but would like to wait until Saturday for a decision, to see if the methylprednisolone has a more significant effect than it has as of  today, which is day 4/5.  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 Agree with benzodiazepine trial: 2 mg Ativan IV q6h. Also has developing torticollis.  - Remote Dermatology consult pending for rash on thighs. Started before steroid rx initiated.  - Given lack of po intake and lack of full horizontal EOM on exam, there is some likelihood of thiamine deficiency. Will start high-dose protocol with 500 mg IV TID x 3 days, then 100 mg po thereafter.  - Neurology will continue to follow       LOS: 5 days   @Electronically  signed: Dr. Kerney Elbe 02/12/2021  8:36 AM

## 2021-02-13 ENCOUNTER — Inpatient Hospital Stay (HOSPITAL_COMMUNITY): Payer: Medicaid Other

## 2021-02-13 DIAGNOSIS — G0490 Encephalitis and encephalomyelitis, unspecified: Secondary | ICD-10-CM | POA: Diagnosis not present

## 2021-02-13 DIAGNOSIS — R4182 Altered mental status, unspecified: Secondary | ICD-10-CM | POA: Diagnosis not present

## 2021-02-13 DIAGNOSIS — F061 Catatonic disorder due to known physiological condition: Secondary | ICD-10-CM | POA: Diagnosis not present

## 2021-02-13 LAB — COMPREHENSIVE METABOLIC PANEL
ALT: 13 U/L (ref 0–44)
AST: 16 U/L (ref 15–41)
Albumin: 3.2 g/dL — ABNORMAL LOW (ref 3.5–5.0)
Alkaline Phosphatase: 29 U/L — ABNORMAL LOW (ref 38–126)
Anion gap: 6 (ref 5–15)
BUN: 24 mg/dL — ABNORMAL HIGH (ref 6–20)
CO2: 26 mmol/L (ref 22–32)
Calcium: 8.9 mg/dL (ref 8.9–10.3)
Chloride: 105 mmol/L (ref 98–111)
Creatinine, Ser: 0.73 mg/dL (ref 0.44–1.00)
GFR, Estimated: >60 mL/min (ref >60)
Glucose, Bld: 149 mg/dL — ABNORMAL HIGH (ref 70–99)
Potassium: 3.8 mmol/L (ref 3.5–5.1)
Sodium: 137 mmol/L (ref 135–145)
Total Bilirubin: 0.6 mg/dL (ref 0.3–1.2)
Total Protein: 6.3 g/dL — ABNORMAL LOW (ref 6.5–8.1)

## 2021-02-13 LAB — CBC WITH DIFFERENTIAL/PLATELET
Abs Immature Granulocytes: 0.08 10*3/uL — ABNORMAL HIGH (ref 0.00–0.07)
Basophils Absolute: 0 10*3/uL (ref 0.0–0.1)
Basophils Relative: 0 %
Eosinophils Absolute: 0 10*3/uL (ref 0.0–0.5)
Eosinophils Relative: 0 %
HCT: 33.6 % — ABNORMAL LOW (ref 36.0–46.0)
Hemoglobin: 10.9 g/dL — ABNORMAL LOW (ref 12.0–15.0)
Immature Granulocytes: 1 %
Lymphocytes Relative: 11 %
Lymphs Abs: 0.6 10*3/uL — ABNORMAL LOW (ref 0.7–4.0)
MCH: 29.9 pg (ref 26.0–34.0)
MCHC: 32.4 g/dL (ref 30.0–36.0)
MCV: 92.3 fL (ref 80.0–100.0)
Monocytes Absolute: 0.3 10*3/uL (ref 0.1–1.0)
Monocytes Relative: 5 %
Neutro Abs: 4.8 10*3/uL (ref 1.7–7.7)
Neutrophils Relative %: 83 %
Platelets: 290 10*3/uL (ref 150–400)
RBC: 3.64 MIL/uL — ABNORMAL LOW (ref 3.87–5.11)
RDW: 13 % (ref 11.5–15.5)
WBC: 5.8 10*3/uL (ref 4.0–10.5)
nRBC: 0 % (ref 0.0–0.2)

## 2021-02-13 LAB — GLUCOSE, CAPILLARY
Glucose-Capillary: 156 mg/dL — ABNORMAL HIGH (ref 70–99)
Glucose-Capillary: 150 mg/dL — ABNORMAL HIGH (ref 70–99)
Glucose-Capillary: 133 mg/dL — ABNORMAL HIGH (ref 70–99)
Glucose-Capillary: 167 mg/dL — ABNORMAL HIGH (ref 70–99)
Glucose-Capillary: 180 mg/dL — ABNORMAL HIGH (ref 70–99)

## 2021-02-13 LAB — PHOSPHORUS: Phosphorus: 2.5 mg/dL (ref 2.5–4.6)

## 2021-02-13 LAB — CRYPTOCOCCAL ANTIGEN, CSF: Crypto Ag: NEGATIVE

## 2021-02-13 LAB — MAGNESIUM: Magnesium: 2.4 mg/dL (ref 1.7–2.4)

## 2021-02-13 MED ORDER — LEVOTHYROXINE SODIUM 100 MCG PO TABS
100.0000 ug | ORAL_TABLET | Freq: Every day | ORAL | Status: DC
  Administered 2021-02-13 – 2021-02-15 (×3): 100 ug
  Filled 2021-02-13 (×3): qty 1

## 2021-02-13 MED ORDER — THIAMINE HCL 100 MG PO TABS
100.0000 mg | ORAL_TABLET | Freq: Every day | ORAL | Status: DC
  Administered 2021-02-17 – 2021-03-16 (×27): 100 mg
  Filled 2021-02-13 (×27): qty 1

## 2021-02-13 MED ORDER — IOHEXOL 350 MG/ML SOLN
100.0000 mL | Freq: Once | INTRAVENOUS | Status: AC | PRN
  Administered 2021-02-13: 100 mL via INTRAVENOUS

## 2021-02-13 MED ORDER — GADOBUTROL 1 MMOL/ML IV SOLN
6.0000 mL | Freq: Once | INTRAVENOUS | Status: AC | PRN
  Administered 2021-02-13: 6 mL via INTRAVENOUS

## 2021-02-13 MED ORDER — IMMUNE GLOBULIN (HUMAN) 10 GM/100ML IV SOLN
400.0000 mg/kg | INTRAVENOUS | Status: AC
  Administered 2021-02-13 – 2021-02-17 (×5): 25 g via INTRAVENOUS
  Filled 2021-02-13: qty 50
  Filled 2021-02-13 (×2): qty 200
  Filled 2021-02-13: qty 250
  Filled 2021-02-13 (×2): qty 200

## 2021-02-13 MED ORDER — PANTOPRAZOLE 2 MG/ML SUSPENSION
40.0000 mg | Freq: Every day | ORAL | Status: DC
  Administered 2021-02-13 – 2021-02-17 (×3): 40 mg
  Filled 2021-02-13 (×4): qty 20

## 2021-02-13 NOTE — Consult Note (Signed)
[facility] Face-to-Face Psychiatry Consult   Reason for Consult: Concern for catatonia  Referring Physician:  Gerlene Fee, MD Patient Identification: Jamie [name] MRN:  [id number] Principal Diagnosis: Encephalitis Diagnosis:  Principal Problem:   Encephalitis Active Problems:   Primary hypothyroidism   Catatonia associated with another mental disorder  Assessment  Jamie [name] is a 20 y.o. female admitted medically for acute altered mental status with mutism with recent history of nausea and vomiting.  02/06/2021  2:17 PM patient carries no known past psychiatric history.  Patient carries known past medical history of Hashimoto's thyroiditis.  Psychiatry was consulted for acute change in altered mental status with concern for catatonia.  Patient was prescribed Synthroid outpatient; however, per EMR patient's mother endorsed patient had not been compliant.  On initial examination, patient is only able to answer very few questions with words and mostly responds with a high-pitched "hmm" as if she does not know what was asked. We plan to do an Ativan trial for possible catatonia as well as start low-dose antipsychotics to help with severe agitation that has been noted during hospitalization.   Patient had a family friend in room who endorsed the patient's mental status appears to wax and wane.  Increasing concern for delirium related to medical illness.  However, patient was able to endorse being in pain in her upper extremities.  We will attempt chemical restraints to help decrease frequency of severe agitation outbursts leading to physical restraints.   10/18: Seen with resident who wrote original consult note; agree with assessement and plan. Deferred ativan trial (pt was supposed to get ativan prior to MRI, was going to have nurse message to evaluate before/after does not appear to have gotten it). 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 Will likely do formal ativan challenge tomorrow if pt still unable to take PO - although agree with neurology that tx of underlying medical cause at this time is of primary importance, pts with catatonia 2/2 encephalitis can still often have some degree of response to standard treatments for catatonia such as lorazepam.    10/19: Patient appears less interactive today and did not appear to get agitated during visit. Patient had family at bedside who endorsed that patient's presentation continues to wax and wane. Overnight patient required assessment by primary team who noted increased rigidity in patient and patient was noted to have increased rigidity in her BLE and mild rigidity in her biceps. Patient scored a 10 on the Bush- Francis scale.  This is again concerning for catatonia. Patient is requiring physical restraints due episodes of hyperdelirium and there is concern that continued frequent use of these in conjunction with muscle rigidity could lead to muscle breakdown. It remains possible that patient's catatonic like presentation is 2/2 to an autoimmune encephalitis; however it may be beneficial to treat both underlying cause and symptomatic management of of more emergent catatonia symptoms to decrease chances of further decompensation.   10/21: Patient very difficult to arouse this AM. Attempted to see patient again in the PM. Patient was more responsive in the PM. Patient appeared to struggle to open her eyes, but appeared to attempt to interact to commands and let out a whimper. Patient had a Coretrack place as she has not PO intake in at least 2 days. BFCRS prior to Ativan 14. Post- Ativan: Patient  opened her eyes wider and able to maintain and blink, patient attempting to lift head and able to move both arms and squeeze both hands on command. Attempted to  communicate with patient via blinking; however this was inconsistent. Patient was also able to move L foot on command. Patient had decreased rigidity in BLE. Patient's positive response to Ativan trial remains convincing that patient is currently catatonic and this may be 2/2 encephalitis.   10/22: Patient sitting in bed this morning with mitt on left hand. Neurology and IM at bedside as well.Slow spontaneous upper extremity movements noted.  . Patient unable to follow commands this morning, not able to  squeeze hands or open eyes when asked. No documentation how how patient responded to ativan overnight. Spoke with nurse and requested documentation of response to ativan and to pass along to night team as this will provide helpful clinical information.    Labs reviewed:CBC, CMP  EKG-QTC 420 LP-02/08/2021-HSV- neg, oligoclonal bands- neg, anti- nMDA and VZV pending-neg; Protein-50 and Glucose 68, IgG 10.5, WBC 273   Plan Concern for catatonia (r/o autoimmune encephalitis, r/o MDD) -Autoimmune encephalitis work-up per neurology - Ativan 2mg  q6h, for catatonia -if has a positive response to ativan, can consider increasing  - Recommend- Agitation protocol- Zyprexa 2.5mg  PRN (DO NOT GIVE within 30 min of Ativan due to risk of resp depression)  - EKG 10/18 Qtc 420 - CT Chest/ Abdomen/ Pelvis pending   Thank you for this consult.  Psychiatry will continue to follow-will see tomorrow     Total Time spent with patient: 15 minutes  Subjective:   Jamie [name] is a 20 y.o. female,  [place] female patient admitted with acute altered mental status and recent history of nausea and vomiting.  Patient has no known psychiatric history but does have a history of Hashimoto's thyroiditis.   HPI: On assessment today patient remains unable to give much hx. Patient is non verbal, not following commands this morning. She has difficulty opening her eyes and is noted to have spontaneous slow  movements in upper extremities (stereotypy)   Brief BFCRS a@ bedside (+) for mutism, stereotypy, withdrawal - given that we are unable to assess delirium this is largely equivocal. Patient did display some resistance to movement in arms. Post Ativan patient rigidity was significantly decreased and patient has mild improvement in responsiveness. Patient continues to have mutism and level of immobility, as well as withdrawal w/ poor PO intake.    Past Medical History:  Past Medical History:  Diagnosis Date  . Acquired autoimmune hypothyroidism    Dx 12/2014, TSH 110, FT4 0.3   History reviewed. No pertinent surgical history. Family History:  Family History  Problem Relation Age of Onset  . Healthy Mother   . Healthy Father     Social History:  Social History   Substance and Sexual Activity  Alcohol Use Not Currently     Social History   Substance and Sexual Activity  Drug Use Not Currently    Social History   Socioeconomic History  . Marital status: Single    Spouse name: Not on file  . Number of children: Not on file  . Years of education: Not on file  . Highest education level: Not on file  Occupational History  . Not on file  Tobacco Use  . Smoking status: Never  . Smokeless tobacco: Never  Substance and Sexual Activity  . Alcohol use: Not Currently  . Drug use: Not Currently  . Sexual activity:  Not on file  Other Topics Concern  . Not on file  Social History Narrative   Lives at home with mom and maternal grandmother and two siblings attends [employer] school is in the 8th grade.    Social Determinants of Health   Financial Resource Strain: Not on file  Food Insecurity: Not on file  Transportation Needs: Not on file  Physical Activity: Not on file  Stress: Not on file  Social Connections: Not on file   Additional Social History:    Allergies:  No Known Allergies  Labs:  Results for orders placed or performed during the hospital encounter of  02/06/21 (from the past 48 hour(s))  CBC with Differential/Platelet     Status: Abnormal   Collection Time: 02/12/21  1:32 AM  Result Value Ref Range   WBC 9.5 4.0 - 10.5 K/uL   RBC 3.80 (L) 3.87 - 5.11 MIL/uL   Hemoglobin 11.3 (L) 12.0 - 15.0 g/dL   HCT 34.9 (L) 36.0 - 46.0 %   MCV 91.8 80.0 - 100.0 fL   MCH 29.7 26.0 - 34.0 pg   MCHC 32.4 30.0 - 36.0 g/dL   RDW 13.0 11.5 - 15.5 %   Platelets 321 150 - 400 K/uL   nRBC 0.0 0.0 - 0.2 %   Neutrophils Relative % 87 %   Neutro Abs 8.4 (H) 1.7 - 7.7 K/uL   Lymphocytes Relative 9 %   Lymphs Abs 0.8 0.7 - 4.0 K/uL   Monocytes Relative 3 %   Monocytes Absolute 0.3 0.1 - 1.0 K/uL   Eosinophils Relative 0 %   Eosinophils Absolute 0.0 0.0 - 0.5 K/uL   Basophils Relative 0 %   Basophils Absolute 0.0 0.0 - 0.1 K/uL   Immature Granulocytes 1 %   Abs Immature Granulocytes 0.07 0.00 - 0.07 K/uL    Comment: Performed at [facility], [address], [place], [place] [address]  Comprehensive metabolic panel     Status: Abnormal   Collection Time: 02/12/21  1:32 AM  Result Value Ref Range   Sodium 139 135 - 145 mmol/L   Potassium 3.9 3.5 - 5.1 mmol/L   Chloride 107 98 - 111 mmol/L   CO2 25 22 - 32 mmol/L   Glucose, Bld 134 (H) 70 - 99 mg/dL    Comment: Glucose reference range applies only to samples taken after fasting for at least 8 hours.   BUN 23 (H) 6 - 20 mg/dL   Creatinine, Ser 0.80 0.44 - 1.00 mg/dL   Calcium 9.4 8.9 - 10.3 mg/dL   Total Protein 6.7 6.5 - 8.1 g/dL   Albumin 3.5 3.5 - 5.0 g/dL   AST 21 15 - 41 U/L   ALT 13 0 - 44 U/L   Alkaline Phosphatase 32 (L) 38 - 126 U/L   Total Bilirubin 0.8 0.3 - 1.2 mg/dL   GFR, Estimated >60 >60 mL/min    Comment: (NOTE) Calculated using the CKD-EPI Creatinine Equation (2021)    Anion gap 7 5 - 15    Comment: Performed at [facility] [address] [place], [place] [address]  Magnesium     Status: Abnormal   Collection Time: 02/12/21  1:32 AM  Result Value Ref Range    Magnesium 2.6 (H) 1.7 - 2.4 mg/dL    Comment: Performed at [facility] [address], [place], [place] [address]  Glucose, capillary     Status: Abnormal   Collection Time:  02/12/21  8:40 PM  Result Value Ref Range   Glucose-Capillary 171 (H) 70 - 99 mg/dL    Comment: Glucose reference range applies only to samples taken after fasting for at least 8 hours.  Glucose, capillary     Status: Abnormal   Collection Time: 02/12/21 11:34 PM  Result Value Ref Range   Glucose-Capillary 152 (H) 70 - 99 mg/dL    Comment: Glucose reference range applies only to samples taken after fasting for at least 8 hours.  CBC with Differential/Platelet     Status: Abnormal   Collection Time: 02/13/21  1:57 AM  Result Value Ref Range   WBC 5.8 4.0 - 10.5 K/uL   RBC 3.64 (L) 3.87 - 5.11 MIL/uL   Hemoglobin 10.9 (L) 12.0 - 15.0 g/dL   HCT 33.6 (L) 36.0 - 46.0 %   MCV 92.3 80.0 - 100.0 fL   MCH 29.9 26.0 - 34.0 pg   MCHC 32.4 30.0 - 36.0 g/dL   RDW 13.0 11.5 - 15.5 %   Platelets 290 150 - 400 K/uL   nRBC 0.0 0.0 - 0.2 %   Neutrophils Relative % 83 %   Neutro Abs 4.8 1.7 - 7.7 K/uL   Lymphocytes Relative 11 %   Lymphs Abs 0.6 (L) 0.7 - 4.0 K/uL   Monocytes Relative 5 %   Monocytes Absolute 0.3 0.1 - 1.0 K/uL   Eosinophils Relative 0 %   Eosinophils Absolute 0.0 0.0 - 0.5 K/uL   Basophils Relative 0 %   Basophils Absolute 0.0 0.0 - 0.1 K/uL   Immature Granulocytes 1 %   Abs Immature Granulocytes 0.08 (H) 0.00 - 0.07 K/uL    Comment: Performed at [facility], [address], [place], [place] [address]  Comprehensive metabolic panel     Status: Abnormal   Collection Time: 02/13/21  1:57 AM  Result Value Ref Range   Sodium 137 135 - 145 mmol/L   Potassium 3.8 3.5 - 5.1 mmol/L   Chloride 105 98 - 111 mmol/L   CO2 26 22 - 32 mmol/L   Glucose, Bld 149 (H) 70 - 99 mg/dL    Comment: Glucose reference range applies only to samples taken after fasting for at least 8 hours.   BUN 24 (H) 6 - 20  mg/dL   Creatinine, Ser 0.73 0.44 - 1.00 mg/dL   Calcium 8.9 8.9 - 10.3 mg/dL   Total Protein 6.3 (L) 6.5 - 8.1 g/dL   Albumin 3.2 (L) 3.5 - 5.0 g/dL   AST 16 15 - 41 U/L   ALT 13 0 - 44 U/L   Alkaline Phosphatase 29 (L) 38 - 126 U/L   Total Bilirubin 0.6 0.3 - 1.2 mg/dL   GFR, Estimated >60 >60 mL/min    Comment: (NOTE) Calculated using the CKD-EPI Creatinine Equation (2021)    Anion gap 6 5 - 15    Comment: Performed at [facility], [place] [address], [place], [place] [address]  Magnesium     Status: None   Collection Time: 02/13/21  1:57 AM  Result Value Ref Range   Magnesium 2.4 1.7 - 2.4 mg/dL    Comment: Performed at [facility] [address], [place], [place] [address]  Phosphorus     Status: None   Collection Time: 02/13/21  1:57 AM  Result Value Ref Range   Phosphorus 2.5 2.5 - 4.6 mg/dL    Comment: Performed at [facility]  [address] [place], [place] [address]  Glucose, capillary     Status: Abnormal   Collection Time: 02/13/21  3:33 AM  Result Value Ref Range   Glucose-Capillary 156 (H) 70 - 99 mg/dL    Comment: Glucose reference range applies only to samples taken after fasting for at least 8 hours.  Glucose, capillary     Status: Abnormal   Collection Time: 02/13/21  8:08 AM  Result Value Ref Range   Glucose-Capillary 150 (H) 70 - 99 mg/dL    Comment: Glucose reference range applies only to samples taken after fasting for at least 8 hours.  Glucose, capillary     Status: Abnormal   Collection Time: 02/13/21 12:06 PM  Result Value Ref Range   Glucose-Capillary 133 (H) 70 - 99 mg/dL    Comment: Glucose reference range applies only to samples taken after fasting for at least 8 hours.    Current Facility-Administered Medications  Medication Dose Route Frequency Provider Last Rate Last Admin  . acetaminophen (TYLENOL) tablet 650 mg  650 mg Oral Q6H PRN Maudie Mercury, MD   650 mg at 02/07/21 2215   Or  . acetaminophen (TYLENOL)  suppository 650 mg  650 mg Rectal Q6H PRN Maudie Mercury, MD      . feeding supplement (OSMOLITE 1.2 CAL) liquid 1,000 mL  1,000 mL Per Tube Continuous Axel Filler, MD 45 mL/hr at 02/13/21 0045 1,000 mL at 02/13/21 0045  . free water 30 mL  30 mL Per Tube Q4H Axel Filler, MD   30 mL at 02/13/21 1127  . Immune Globulin 10% (PRIVIGEN) IV infusion 25 g  400 mg/kg Intravenous Q24 Hr x 5 Kerney Elbe, MD 18 mL/hr at 02/13/21 1324 25 g at 02/13/21 1324  . levothyroxine (SYNTHROID) tablet 100 mcg  100 mcg Per Tube Q0600 Karren Cobble, RPH   100 mcg at 02/13/21 1132  . LORazepam (ATIVAN) injection 2 mg  2 mg Intravenous Q6H Damita Dunnings B, MD   2 mg at 02/13/21 1128  . pantoprazole sodium (PROTONIX) 40 mg/20 mL oral suspension 40 mg  40 mg Per Tube QHS Karren Cobble, RPH      . thiamine 500mg  in normal saline (38ml) IVPB  500 mg Intravenous TID Kerney Elbe, MD 100 mL/hr at 02/13/21 1256 500 mg at 02/13/21 1256  . [START ON 02/16/2021] thiamine tablet 100 mg  100 mg Per Tube Daily Karren Cobble, [facility]        Psychiatric Specialty Exam: Patient currently mute.  Presentation  General Appearance: Bizarre  Eye Contact:None  Speech:Other (comment) (non verbal)  Speech Volume:-- (non verbal)  Handedness:-- (unable to assess)   Mood and Affect  Mood:-- (unable to assess)  Affect:Flat   Thought Process  Thought Processes:-- (unable to assess)  Descriptions of Associations:-- (unable to assess)  Orientation:-- (unable to assess)  Thought Content:-- (unable to assess)  History of Schizophrenia/Schizoaffective disorder:No data recorded Duration of Psychotic Symptoms:No data recorded Hallucinations:Hallucinations: -- (unable to assess) Ideas of Reference:-- (unable to assess)  Suicidal Thoughts:Suicidal Thoughts: -- (unable to assess) Homicidal Thoughts:Homicidal Thoughts: -- (unable to assess)  Sensorium  Memory:-- (unable to  assess)  Judgment:-- (unable to assess)  Insight:-- (unable to assess)   Executive Functions  Concentration:-- (unable to assess)  Attention Span:-- (unable to assess)  Recall:-- (unable to assess)  Fund of Knowledge:-- (unable to assess)  Language:-- (unable to assess)   Psychomotor Activity  Psychomotor Activity:Psychomotor Activity: -- (stereotypy-constantly moving hands)  Assets  Assets:Social Support   Sleep  Sleep:Sleep: -- (unable to assess. per documentation, has been sleeping at times)  Physical Exam: Physical Exam Constitutional:      Comments: Non verbal, does not open eyes, unable to follow commands  HENT:     Head: Normocephalic and atraumatic.  Skin:    General: Skin is warm and dry.     Comments: Patient has rash across her legs. Image in Internal medicine resident's notes.   ROS Blood pressure 91/63, pulse 77, temperature 97.8 F (36.6 C), temperature source Axillary, resp. rate 20, weight 58.9 kg, last menstrual period 01/11/2021, SpO2 99 %. Body mass index is 23.73 kg/m.   PGY-2 Ival Bible, MD 02/13/2021 1:38 PM

## 2021-02-13 NOTE — Progress Notes (Addendum)
HD#6 Subjective:  Overnight Events: no acute events overnight  Patient in bed with restraints in place with periodic movements.  Nursing staff note that patient had episode where she had dry heaves.  Mother nursing staff as well as Dr. Cheral Marker of neurology at bedside.  No other significant events overnight.  Objective:  Vital signs in last 24 hours: Vitals:   02/12/21 1558 02/12/21 2017 02/12/21 2329 02/13/21 0334  BP: 105/68 (!) 99/57 (!) 101/55 105/69  Pulse: 64 60 68 60  Resp: 18 20 19 20   Temp: 98.2 F (36.8 C) 98.2 F (36.8 C) 98.9 F (37.2 C) 98.7 F (37.1 C)  TempSrc: Axillary Axillary Axillary Axillary  SpO2: 98% 99% 99% 100%  Weight:       Supplemental O2: Room Air SpO2: 100 %  Physical Exam:  Constitutional: No acute distress HENT: normocephalic atraumatic Eyes: Pupils equal and reactive to light Neck: supple Cardiovascular: regular rate and rhythm Pulmonary/Chest: normal work of breathing on room air Abdominal: soft, non-tender, non-distended MSK: normal bulk and tone Neurological: Not arousable but with spontaneous movements of her upper extremities.  She does not follow commands and is nonverbal.  She does not gaze with any type of stimuli.  Facial grimacing randomly.  Toes downgoing bilaterally. Skin:-Hyper pigmented rash on lower extremities  Filed Weights   02/12/21 1409  Weight: 58.9 kg     Intake/Output Summary (Last 24 hours) at 02/13/2021 0728 Last data filed at 02/13/2021 0600 Gross per 24 hour  Intake 2034.25 ml  Output 1000 ml  Net 1034.25 ml   Net IO Since Admission: 2,868.82 mL [02/13/21 0728]  Pertinent Labs: CBC Latest Ref Rng & Units 02/13/2021 02/12/2021 02/11/2021  WBC 4.0 - 10.5 K/uL 5.8 9.5 12.0(H)  Hemoglobin 12.0 - 15.0 g/dL 10.9(L) 11.3(L) 11.2(L)  Hematocrit 36.0 - 46.0 % 33.6(L) 34.9(L) 34.9(L)  Platelets 150 - 400 K/uL 290 321 314    CMP Latest Ref Rng & Units 02/13/2021 02/12/2021 02/11/2021  Glucose 70 - 99  mg/dL 149(H) 134(H) 129(H)  BUN 6 - 20 mg/dL 24(H) 23(H) 19  Creatinine 0.44 - 1.00 mg/dL 0.73 0.80 0.76  Sodium 135 - 145 mmol/L 137 139 140  Potassium 3.5 - 5.1 mmol/L 3.8 3.9 3.9  Chloride 98 - 111 mmol/L 105 107 106  CO2 22 - 32 mmol/L 26 25 25   Calcium 8.9 - 10.3 mg/dL 8.9 9.4 9.6  Total Protein 6.5 - 8.1 g/dL 6.3(L) 6.7 -  Total Bilirubin 0.3 - 1.2 mg/dL 0.6 0.8 -  Alkaline Phos 38 - 126 U/L 29(L) 32(L) -  AST 15 - 41 U/L 16 21 -  ALT 0 - 44 U/L 13 13 -    Imaging: CT CHEST ABDOMEN PELVIS W CONTRAST  Result Date: 02/13/2021 CLINICAL DATA:  Concern for malignancy. EXAM: CT CHEST, ABDOMEN, AND PELVIS WITH CONTRAST TECHNIQUE: Multidetector CT imaging of the chest, abdomen and pelvis was performed following the standard protocol during bolus administration of intravenous contrast. CONTRAST:  148mL OMNIPAQUE IOHEXOL 350 MG/ML SOLN COMPARISON:  None. FINDINGS: CT CHEST FINDINGS Cardiovascular: There is no cardiomegaly or pericardial effusion. The thoracic aorta is unremarkable. The central pulmonary arteries appear patent. Mediastinum/Nodes: No hilar or mediastinal adenopathy. An enteric tube noted within the esophagus extends into the distal stomach. No mediastinal fluid collection. Lungs/Pleura: The lungs are clear. There is no pleural effusion pneumothorax. The central airways are patent. Musculoskeletal: No acute osseous pathology. CT ABDOMEN PELVIS FINDINGS No intra-abdominal free air.  Trace free fluid  within the pelvis. Hepatobiliary: No focal liver abnormality is seen. No gallstones, gallbladder wall thickening, or biliary dilatation. Pancreas: Unremarkable. No pancreatic ductal dilatation or surrounding inflammatory changes. Spleen: Normal in size without focal abnormality. Adrenals/Urinary Tract: Adrenal glands are unremarkable. Kidneys are normal, without renal calculi, focal lesion, or hydronephrosis. Bladder is unremarkable. Stomach/Bowel: Enteric tube with tip in the distal stomach.  Oral contrast noted in the colon. No bowel obstruction or active inflammation. The appendix is normal. Vascular/Lymphatic: The abdominal aorta and IVC are unremarkable. No portal venous gas. There is no adenopathy. Reproductive: The uterus and ovaries are grossly unremarkable. Other: None Musculoskeletal: No acute or significant osseous findings. IMPRESSION: No acute intrathoracic, abdominal, or pelvic pathology. No evidence of malignancy. Electronically Signed   By: Anner Crete M.D.   On: 02/13/2021 01:02   DG Abd Portable 1V  Result Date: 02/12/2021 CLINICAL DATA:  Feeding tube placement EXAM: PORTABLE ABDOMEN - 1 VIEW COMPARISON:  02/09/2021 FINDINGS: Feeding tube terminates in the region of the distal stomach. Visualized lung bases are clear. No dilute the bowel seen in the visualized abdomen. IMPRESSION: Feeding tube terminates in the region of the distal stomach. Electronically Signed   By: Miachel Roux M.D.   On: 02/12/2021 14:50    Assessment/Plan:   Principal Problem:   Encephalitis Active Problems:   Primary hypothyroidism   Catatonia associated with another mental disorder   Patient Summary: Jamie [name] is a 20 y.o. with a pertinent PMH of Hashimoto thyroiditis, who presented with confusion and admitted for encephalopathy.   Encephalopathy Continues to have waxing and waning of mental status with minimal improvement since starting steroid therapy. Day 5 of IV methylprednisolone. CT chest abdomen pelvis with contrast negative for any pathology or malignancy.  NMDA antibody receptors pending, CMV and EBV of CSF fluid pending.  HSV and VZV CSF negative.  Discussion had with Dr. Cheral Marker and patient's mother and she would like to pursue IVIG at this time.  Risk and benefits were discussed with the mother including that if IVIG fails then plasma exchange would be recommended which would take out all of the IVIG.  Per neurology, patient's mother acknowledged this.  -Greatly  appreciate neurology's assistance -IVIG day 1, IV methylprednisolone day 5/5 -NMDA receptor antibodies pending, CMV and EBV CSF fluid pending. -ID consulted for further evaluation  Appreciate their recommendations. 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  Post trial patient had opening of her eyes and was able to maintain and blink and lift head and follow some commands per psychiatry note.  Start scheduled Ativan every 6 hours. -Greatly appreciate psychiatry team's assistance, pending their recommendations -Scheduled 2mg  Ativan every 6 hours  Hyperpigmented Macular Rash Patient with hyperpigmented rash on lower extremities, plan to refer to dermatology for further evaluation.  Rash present prior to initiation of steroid.  Unclear etiology at this time. -Dermatology consult pending  Hypothyroidism 2/2 Hashimoto Disease Change to PO levothyroxine now that she has NG placed.   Malnutrition Secondary to encephalopathy and lack of p.o. intake.  Core track tube placed yesterday.  Magnesium and phosphate stable.  Continue tube feeds and to trend electrolytes. -Appreciate dietitian assistance recommendation. -thiamine daily -free water and feeding supplement daily  Diet: feeding supplement IVF: None,None VTE: SCDs, lovenox discontinued by neurology  Code: Full PT/OT  recs:  Pending Family Update: Family at bedside  Dispo: Anticipated discharge pending further workup  [facility] Resident PGY-2 Pager [phone] Please contact the on call pager after 5 pm and on weekends at [phone].

## 2021-02-13 NOTE — Plan of Care (Signed)
Pt has been alert but also sleeping-pt noted snoring. Pt had sitter at the beginning of shift but pts mother remained in room. Feedings increased to 15mls and has been tolerating well.    Problem: Safety: Goal: Non-violent Restraint(s) Outcome: Progressing   Problem: Education: Goal: Knowledge of General Education information will improve Description: Including pain rating scale, medication(s)/side effects and non-pharmacologic comfort measures Outcome: Progressing   Problem: Health Behavior/Discharge Planning: Goal: Ability to manage health-related needs will improve Outcome: Progressing   Problem: Clinical Measurements: Goal: Ability to maintain clinical measurements within normal limits will improve Outcome: Progressing Goal: Will remain free from infection Outcome: Progressing Goal: Diagnostic test results will improve Outcome: Progressing Goal: Respiratory complications will improve Outcome: Progressing Goal: Cardiovascular complication will be avoided Outcome: Progressing   Problem: Activity: Goal: Risk for activity intolerance will decrease Outcome: Progressing   Problem: Nutrition: Goal: Adequate nutrition will be maintained Outcome: Progressing   Problem: Coping: Goal: Level of anxiety will decrease Outcome: Progressing   Problem: Elimination: Goal: Will not experience complications related to bowel motility Outcome: Progressing Goal: Will not experience complications related to urinary retention Outcome: Progressing   Problem: Pain Managment: Goal: General experience of comfort will improve Outcome: Progressing   Problem: Safety: Goal: Ability to remain free from injury will improve Outcome: Progressing   Problem: Skin Integrity: Goal: Risk for impaired skin integrity will decrease Outcome: Progressing

## 2021-02-13 NOTE — Progress Notes (Signed)
EEG complete - results pending 

## 2021-02-13 NOTE — Procedures (Signed)
Patient Name: Jamie [name]  MRN: [id number]  Epilepsy Attending: Lora Havens  Referring Physician/Provider: Dr Kerney Elbe Date: 10/22/ 2022 Duration: 22.39 mins   Patient history:  20 y.o. female with PMH significant for Hashimoto thyroiditis who presents with vomiting, headache and not eating for 4 days and not talking since today. EEG to evaluate for seizure   Level of alertness: lethargic, asleep   AEDs during EEG study: None   Technical aspects: This EEG study was done with scalp electrodes positioned according to the 10-20 International system of electrode placement. Electrical activity was acquired at a sampling rate of 500Hz  and reviewed with a high frequency filter of 70Hz  and a low frequency filter of 1Hz . EEG data were recorded continuously and digitally stored.    Description: EEG showed continuous generalized rhythmic 2-3 Hz  sharply contoured delta slowing. Sleep was characterized by sleep spindles (12-14hz ), maximal frontocentral region. Sharp transient was noted in left temporal region.Hyperventilation and photic stimulation were not performed.      ABNORMALITY - Generalized rhythmic delta activity ( GRDA)   IMPRESSION: This study showed generalized rhythmic delta activity which is on the ictal-interictal continuum with low potential for seizures. There is also severe diffuse encephalopathy, nonspecific etiology. No seizures or definite epileptiform discharges were seen throughout the recording.  If concern for ictal-interictal activity persists, please consider long term monitoring.   Jayanth Szczesniak [name]

## 2021-02-13 NOTE — Progress Notes (Signed)
Pt received dose of Ativan. No change in neuro status pt did have eyes open and leftward gazing but now has them closed. RN will continue to monitor.

## 2021-02-13 NOTE — Progress Notes (Signed)
Pt brought down to MRI via pt transport. Upon arrival pt's feeding tube connected to MR unsafe pump. Attempted to contact RN to pause and disconnect feeding tube. RN unavailable as currently in shift change. Unable to safely scan pt while connected to unsafe pump. Sent back to room via pt transport.

## 2021-02-13 NOTE — Consult Note (Signed)
[name]    Date of Admission:  02/06/2021     Reason for Consult: Encephalitis     Referring Physician: Neurology   ASSESSMENT:     Complicated and difficult case of a 20 year old woman with encephalopathy that is hypoactive of unknown etiology and thus far not significantly improving with high-dose IV steroids.  Neurology and internal medicine has undertaken an extensive work-up which is still ongoing.  The leading differential is some sort of autoimmune or Hashimoto encephalopathy leading to her presentation.  It is felt less likely that she has some sort of infectious etiology, however, we were asked to see to lend a second opinion on this.  I would agree that this does not appear to be infectious in nature.  She does have a lymphocytic pleocytosis that could potentially represent a viral encephalitis.  However, HSV and VZV PCR testing was negative and treatment for other viral encephalitis would be mainly supportive.  She is originally from [place], however, her presentation does not seem consistent with tuberculous meningitis and she has been in the [place] now for over 15 years.  A repeat MRI with and without contrast is planned for today to assess for possible abnormal enhancement however as her initial MRI was abbreviated and done without contrast.  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 free water  30 mL Per Tube Q4H  .  levothyroxine  100 mcg Per Tube Q0600  . LORazepam  2 mg Intravenous Q6H  . pantoprazole sodium  40 mg Per Tube QHS  . [START ON 02/16/2021] thiamine  100 mg Per Tube Daily   Continuous Infusions: . feeding supplement (OSMOLITE 1.2 CAL) 1,000 mL (02/13/21 0045)  . Immune Globulin 10%    . methylPREDNISolone (SOLU-MEDROL) injection 1,000 mg (02/12/21 0917)  . thiamine injection 500 mg (02/12/21 2130)   PRN Meds:.acetaminophen **OR** acetaminophen  HPI:    Jamie [name] is a 20 y.o. female with past medical history of Hashimoto thyroiditis who presented to the emergency department 02/06/2021 for evaluation of vomiting, headache, poor oral intake, and decreased verbalization.  She was admitted and has undergone extensive work-up.  Her presentation is most concerning per neurology for Hashimoto's encephalitis given her prior history of Hashimoto's thyroiditis with elevated TSH and TPO antibody.  Also being considered is anti-NMDA receptor encephalitis.  She has received 4 days of IV methylprednisolone with the fifth dose today.  She has also been evaluated by psychiatry and started on a trial of scheduled benzodiazepines for catatonia.  She has not had any significant improvement to date.  They are considering addition of IVIG versus Plex.    Patient was seen this afternoon alongside her mother and 2 family friends.  They report that she had sudden onset of symptoms the day prior to admission.  She lives here locally and works at [employer].  No recent travel or unusual exposure history.  They report that she is originally from [place] however has been in  the [place] now for approximately 16 years.  They report that she was screened for tuberculosis upon entry to [place] and has had no prior exposure or treatment of TB.  There was concern today that her skin was warm and clammy which was new.  This raised the concern that she may have been trying to spike a fever.  No neck stiffness  was noted.  She has no peripheral leukocytosis and she has been afebrile throughout the course of her admission without receiving any antipyretics.  We have been consulted for further recommendations regarding the possibility of an infectious etiology to her presentation.  Work-up thus far: -CSF appearance clear and colorless -CSF WBC 318, 94% lymphocytes, 0% neutrophils -CSF protein mildly elevated at 50, glucose 68 -CSF bacterial culture with gram stain negative, fungal cultures no growth to date -CSF HSV and VZV PCR negative -CSF cryptococcal antigen negative  -HIV antibody/antigen negative, HIV RNA negative -CT chest, abdomen, pelvis with no acute intrathoracic, abdominal, or pelvic pathology.  No evidence of malignancy -MRI brain without contrast no acute infarct, hemorrhage, or hydrocephalus, extra-axial collection or mass lesion.  Hippocampi are symmetric in size/signal within normal limit - CSF IgG elevated at 10.5.  - CSF IgG-albumin ratio elevated at 0.4.  - CSF IgG index elevated at 1.4.     Past Medical History:  Diagnosis Date  . Acquired autoimmune hypothyroidism    Dx 12/2014, TSH 110, FT4 0.3    Social History   Tobacco Use  . Smoking status: Never  . Smokeless tobacco: Never  Substance Use Topics  . Alcohol use: Not Currently  . Drug use: Not Currently    Family History  Problem Relation Age of Onset  . Healthy Mother   . Healthy Father     No Known Allergies  Review of Systems  Unable to perform ROS: Mental status change   OBJECTIVE:   Blood pressure 105/69, pulse 60, temperature 98.7 F (37.1 C), temperature source Axillary, resp. rate 20, weight 58.9 kg, last menstrual period 01/11/2021, SpO2 100 %. Body mass index is 23.73 kg/m.  Physical Exam Constitutional:      General: She is not in acute distress.    Appearance: She is not toxic-appearing.  HENT:     Head: Normocephalic and atraumatic.     Mouth/Throat:     Mouth: Mucous membranes  are moist.     Pharynx: Oropharynx is clear.  Cardiovascular:     Rate and Rhythm: Normal rate and regular rhythm.  Pulmonary:     Effort: Pulmonary effort is normal. No respiratory distress.     Breath sounds: Normal breath sounds.  Abdominal:     General: There is no distension.     Palpations: Abdomen is soft.     Tenderness: There is no abdominal tenderness.  Musculoskeletal:     Cervical back: Normal range of motion and neck supple. No rigidity.  Skin:    General: Skin is warm and dry.  Neurological:     Comments: Face appears symmetric.  She is non-verbal and does not follow commands.  She recently received Ativan per her RN.  She appears in a drowsy state.  She does not track or fixate with her eyes.    Psychiatric:     Comments: Unable to assess.     Lab Results: Lab Results  Component Value Date   WBC 5.8 02/13/2021   HGB 10.9 (L) 02/13/2021   HCT 33.6 (L) 02/13/2021   MCV 92.3  02/13/2021   PLT 290 02/13/2021    Lab Results  Component Value Date   NA 137 02/13/2021   K 3.8 02/13/2021   CO2 26 02/13/2021   GLUCOSE 149 (H) 02/13/2021   BUN 24 (H) 02/13/2021   CREATININE 0.73 02/13/2021   CALCIUM 8.9 02/13/2021   GFRNONAA >60 02/13/2021    Lab Results  Component Value Date   ALT 13 02/13/2021   AST 16 02/13/2021   ALKPHOS 29 (L) 02/13/2021   BILITOT 0.6 02/13/2021       Component Value Date/Time   CRP 0.7 02/07/2021 0443       Component Value Date/Time   ESRSEDRATE 15 02/07/2021 0443    I have reviewed the micro and lab results in Epic.  Imaging: CT CHEST ABDOMEN PELVIS W CONTRAST  Result Date: 02/13/2021 CLINICAL DATA:  Concern for malignancy. EXAM: CT CHEST, ABDOMEN, AND PELVIS WITH CONTRAST TECHNIQUE: Multidetector CT imaging of the chest, abdomen and pelvis was performed following the standard protocol during bolus administration of intravenous contrast. CONTRAST:  110mL OMNIPAQUE IOHEXOL 350 MG/ML SOLN COMPARISON:  None. FINDINGS: CT CHEST  FINDINGS Cardiovascular: There is no cardiomegaly or pericardial effusion. The thoracic aorta is unremarkable. The central pulmonary arteries appear patent. Mediastinum/Nodes: No hilar or mediastinal adenopathy. An enteric tube noted within the esophagus extends into the distal stomach. No mediastinal fluid collection. Lungs/Pleura: The lungs are clear. There is no pleural effusion pneumothorax. The central airways are patent. Musculoskeletal: No acute osseous pathology. CT ABDOMEN PELVIS FINDINGS No intra-abdominal free air.  Trace free fluid within the pelvis. Hepatobiliary: No focal liver abnormality is seen. No gallstones, gallbladder wall thickening, or biliary dilatation. Pancreas: Unremarkable. No pancreatic ductal dilatation or surrounding inflammatory changes. Spleen: Normal in size without focal abnormality. Adrenals/Urinary Tract: Adrenal glands are unremarkable. Kidneys are normal, without renal calculi, focal lesion, or hydronephrosis. Bladder is unremarkable. Stomach/Bowel: Enteric tube with tip in the distal stomach. Oral contrast noted in the colon. No bowel obstruction or active inflammation. The appendix is normal. Vascular/Lymphatic: The abdominal aorta and IVC are unremarkable. No portal venous gas. There is no adenopathy. Reproductive: The uterus and ovaries are grossly unremarkable. Other: None Musculoskeletal: No acute or significant osseous findings. IMPRESSION: No acute intrathoracic, abdominal, or pelvic pathology. No evidence of malignancy. Electronically Signed   By: Anner Crete M.D.   On: 02/13/2021 01:02   DG Abd Portable 1V  Result Date: 02/12/2021 CLINICAL DATA:  Feeding tube placement EXAM: PORTABLE ABDOMEN - 1 VIEW COMPARISON:  02/09/2021 FINDINGS: Feeding tube terminates in the region of the distal stomach. Visualized lung bases are clear. No dilute the bowel seen in the visualized abdomen. IMPRESSION: Feeding tube terminates in the region of the distal stomach.  Electronically Signed   By: Miachel Roux M.D.   On: 02/12/2021 14:50     Imaging independently reviewed in Epic.  [name] [phone] pager 02/13/2021, 11:20 AM  I spent greater than 110 minutes with the patient including greater than 50% of time in face to face counsel of the patient and in coordination of their care.

## 2021-02-13 NOTE — Progress Notes (Signed)
Brief HPI: 20 y.o. 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 intake for 4 days and decreased verbalization 10/15 prior to arrival.  Initial work-up revealed a mildly elevated creatinine, TSH of 10.09, negative ETOH, and an abnormally elevated thyroperoxidase Ab of 268.    Excerpt from 2016 clinic note with Endocrinology: "Itzamara was seen in her PCP office in September 2016 for her 14 year [facility] and to have a sports physical done. At that visit they discussed that she was not yet menarchal. Labs revealed hypothyroidism with TSH 110 and free T4 0.3 (obtained 01/18/2014). She was started on 25 mcg of Synthroid and referred to endocrinology for further evaluation and management.   Her first visit to [facility] was 02/23/2015, at which time TFTs had improved but continued to be abnormal (TSH 25.065, FT4 0.46 with elevated thyroglobulin Ab and TPO Ab).  Levothyroxine dose was increased to 53mcg daily at that time."   Per Hospitalist team, about 5 years ago, per records, the patient's anti-TPO Ab titer was > 900.   Subjective: No significant overnight events. Sleeping comfortably this AM.   Objective: Current vital signs: BP 105/69 (BP Location: Right Arm)   Pulse 60   Temp 98.7 F (37.1 C) (Axillary)   Resp 20   Wt 58.9 kg   LMP 01/11/2021 Comment: neg preg test  SpO2 100%   BMI 23.73 kg/m  Vital signs in last 24 hours: Temp:  [98 F (36.7 C)-98.9 F (37.2 C)] 98.7 F (37.1 C) (10/22 0334) Pulse Rate:  [56-68] 60 (10/22 0334) Resp:  [18-20] 20 (10/22 0334) BP: (97-105)/(55-71) 105/69 (10/22 0334) SpO2:  [98 %-100 %] 100 % (10/22 0334) Weight:  [58.9 kg] 58.9 kg (10/21 1409)  Intake/Output from previous day: 10/21 0701 - 10/22 0700 In: 2034.3 [I.V.:500; NG/GT:604.3; IV Piggyback:150] Out: 1000 [Urine:1000] Intake/Output this shift: No intake/output data recorded. Nutritional status:  Diet Order             Diet  regular Room service appropriate? Yes; Fluid consistency: Thin  Diet effective now                  Gen: Skin to forehead is warm and clammy to touch.  HEENT: No nuchal rigidity.  Ext: No edema Skin: Nonblanching hyperpigmented rash to thighs as noted yesterday, is also present on lower abdomen and proximal legs below knees.    Neurologic Exam: Ment: The patient is initially asleep. Does not arouse to noxious stimuli this AM. Some spontaneous slow upper extremity movements were noted. Not following commands. Nonverbal with no attempts to communicate. Does not gaze towards or away from visual stimuli when eyes are held open.   CN: PERRL. Did not track or fixate. Eyes are conjugate near the midline. Face symmetric with decreased tone.  Motor: Decreased tone x 4. Did not move BUE to command. Withdrew BLE, moderately briskly, to noxious plantar stimulation without asymmetry.  Sensory: Reacts to noxious plantar stimulation.  Reflexes: 2+ bilateral brachioradialis, patellae and achilles. 3-4 beats clonus with dorsiflexion of right foot, 4-5 beats to left foot. Toes downgoing bilaterally Cerebellar/Gait: Unable to assess  Lab Results: Results for orders placed or performed during the hospital encounter of 02/06/21 (from the past 48 hour(s))  CBC with Differential/Platelet     Status: Abnormal   Collection Time: 02/11/21  9:26 AM  Result Value Ref Range   WBC 12.0 (H) 4.0 - 10.5 K/uL   RBC  3.81 (L) 3.87 - 5.11 MIL/uL   Hemoglobin 11.2 (L) 12.0 - 15.0 g/dL   HCT 34.9 (L) 36.0 - 46.0 %   MCV 91.6 80.0 - 100.0 fL   MCH 29.4 26.0 - 34.0 pg   MCHC 32.1 30.0 - 36.0 g/dL   RDW 12.8 11.5 - 15.5 %   Platelets 314 150 - 400 K/uL   nRBC 0.0 0.0 - 0.2 %   Neutrophils Relative % 86 %   Neutro Abs 10.5 (H) 1.7 - 7.7 K/uL   Lymphocytes Relative 8 %   Lymphs Abs 0.9 0.7 - 4.0 K/uL   Monocytes Relative 5 %   Monocytes Absolute 0.5 0.1 - 1.0 K/uL   Eosinophils Relative 0 %   Eosinophils Absolute 0.0  0.0 - 0.5 K/uL   Basophils Relative 0 %   Basophils Absolute 0.0 0.0 - 0.1 K/uL   Immature Granulocytes 1 %   Abs Immature Granulocytes 0.10 (H) 0.00 - 0.07 K/uL    Comment: Performed at [facility] [address] [place], [place] [address]  Basic metabolic panel     Status: Abnormal   Collection Time: 02/11/21  9:26 AM  Result Value Ref Range   Sodium 140 135 - 145 mmol/L   Potassium 3.9 3.5 - 5.1 mmol/L   Chloride 106 98 - 111 mmol/L   CO2 25 22 - 32 mmol/L   Glucose, Bld 129 (H) 70 - 99 mg/dL    Comment: Glucose reference range applies only to samples taken after fasting for at least 8 hours.   BUN 19 6 - 20 mg/dL   Creatinine, Ser 0.76 0.44 - 1.00 mg/dL   Calcium 9.6 8.9 - 10.3 mg/dL   GFR, Estimated >60 >60 mL/min    Comment: (NOTE) Calculated using the CKD-EPI Creatinine Equation (2021)    Anion gap 9 5 - 15    Comment: Performed at [facility] [address] [place], [place] [address]  Miscellaneous [facility] test (send-out)     Status: None   Collection Time: 02/11/21  9:26 AM  Result Value Ref Range   [facility] test code [id number]    [facility] test name Anti NMDA receptor antibody titer    Source ([facility]) RT SST     Comment: Performed at [facility], [place] [address] [place], [place] [address]   [place] result COMMENT     Comment: (NOTE) Performed At: [facility] [place], [place] [id number] Rush Farmer MD [id number]   CBC with Differential/Platelet     Status: Abnormal   Collection Time: 02/12/21  1:32 AM  Result Value Ref Range   WBC 9.5 4.0 - 10.5 K/uL   RBC 3.80 (L) 3.87 - 5.11 MIL/uL   Hemoglobin 11.3 (L) 12.0 - 15.0 g/dL   HCT 34.9 (L) 36.0 - 46.0 %   MCV 91.8 80.0 - 100.0 fL   MCH 29.7 26.0 - 34.0 pg   MCHC 32.4 30.0 - 36.0 g/dL   RDW 13.0 11.5 - 15.5 %   Platelets 321 150 - 400 K/uL   nRBC 0.0 0.0 - 0.2 %   Neutrophils Relative % 87 %   Neutro Abs 8.4 (H) 1.7 - 7.7 K/uL   Lymphocytes Relative 9 %    Lymphs Abs 0.8 0.7 - 4.0 K/uL   Monocytes Relative 3 %   Monocytes Absolute 0.3 0.1 - 1.0 K/uL   Eosinophils Relative 0 %   Eosinophils Absolute 0.0 0.0 - 0.5  K/uL   Basophils Relative 0 %   Basophils Absolute 0.0 0.0 - 0.1 K/uL   Immature Granulocytes 1 %   Abs Immature Granulocytes 0.07 0.00 - 0.07 K/uL    Comment: Performed at [facility], [place] [address], [place], [place] [address]  Comprehensive metabolic panel     Status: Abnormal   Collection Time: 02/12/21  1:32 AM  Result Value Ref Range   Sodium 139 135 - 145 mmol/L   Potassium 3.9 3.5 - 5.1 mmol/L   Chloride 107 98 - 111 mmol/L   CO2 25 22 - 32 mmol/L   Glucose, Bld 134 (H) 70 - 99 mg/dL    Comment: Glucose reference range applies only to samples taken after fasting for at least 8 hours.   BUN 23 (H) 6 - 20 mg/dL   Creatinine, Ser 0.80 0.44 - 1.00 mg/dL   Calcium 9.4 8.9 - 10.3 mg/dL   Total Protein 6.7 6.5 - 8.1 g/dL   Albumin 3.5 3.5 - 5.0 g/dL   AST 21 15 - 41 U/L   ALT 13 0 - 44 U/L   Alkaline Phosphatase 32 (L) 38 - 126 U/L   Total Bilirubin 0.8 0.3 - 1.2 mg/dL   GFR, Estimated >60 >60 mL/min    Comment: (NOTE) Calculated using the CKD-EPI Creatinine Equation (2021)    Anion gap 7 5 - 15    Comment: Performed at [facility] [address] [place],  [address]  Magnesium     Status: Abnormal   Collection Time: 02/12/21  1:32 AM  Result Value Ref Range   Magnesium 2.6 (H) 1.7 - 2.4 mg/dL    Comment: Performed at [facility] [address], [place], [place] [address]  Glucose, capillary     Status: Abnormal   Collection Time: 02/12/21  8:40 PM  Result Value Ref Range   Glucose-Capillary 171 (H) 70 - 99 mg/dL    Comment: Glucose reference range applies only to samples taken after fasting for at least 8 hours.  Glucose, capillary     Status: Abnormal   Collection Time: 02/12/21 11:34 PM  Result Value Ref Range   Glucose-Capillary 152 (H) 70 - 99 mg/dL    Comment: Glucose  reference range applies only to samples taken after fasting for at least 8 hours.  CBC with Differential/Platelet     Status: Abnormal   Collection Time: 02/13/21  1:57 AM  Result Value Ref Range   WBC 5.8 4.0 - 10.5 K/uL   RBC 3.64 (L) 3.87 - 5.11 MIL/uL   Hemoglobin 10.9 (L) 12.0 - 15.0 g/dL   HCT 33.6 (L) 36.0 - 46.0 %   MCV 92.3 80.0 - 100.0 fL   MCH 29.9 26.0 - 34.0 pg   MCHC 32.4 30.0 - 36.0 g/dL   RDW 13.0 11.5 - 15.5 %   Platelets 290 150 - 400 K/uL   nRBC 0.0 0.0 - 0.2 %   Neutrophils Relative % 83 %   Neutro Abs 4.8 1.7 - 7.7 K/uL   Lymphocytes Relative 11 %   Lymphs Abs 0.6 (L) 0.7 - 4.0 K/uL   Monocytes Relative 5 %   Monocytes Absolute 0.3 0.1 - 1.0 K/uL   Eosinophils Relative 0 %   Eosinophils Absolute 0.0 0.0 - 0.5 K/uL   Basophils Relative 0 %   Basophils Absolute 0.0 0.0 - 0.1 K/uL   Immature Granulocytes 1 %   Abs Immature Granulocytes 0.08 (H) 0.00 -  0.07 K/uL    Comment: Performed at [facility], [place] [address], [place], [place] [address]  Comprehensive metabolic panel     Status: Abnormal   Collection Time: 02/13/21  1:57 AM  Result Value Ref Range   Sodium 137 135 - 145 mmol/L   Potassium 3.8 3.5 - 5.1 mmol/L   Chloride 105 98 - 111 mmol/L   CO2 26 22 - 32 mmol/L   Glucose, Bld 149 (H) 70 - 99 mg/dL    Comment: Glucose reference range applies only to samples taken after fasting for at least 8 hours.   BUN 24 (H) 6 - 20 mg/dL   Creatinine, Ser 0.73 0.44 - 1.00 mg/dL   Calcium 8.9 8.9 - 10.3 mg/dL   Total Protein 6.3 (L) 6.5 - 8.1 g/dL   Albumin 3.2 (L) 3.5 - 5.0 g/dL   AST 16 15 - 41 U/L   ALT 13 0 - 44 U/L   Alkaline Phosphatase 29 (L) 38 - 126 U/L   Total Bilirubin 0.6 0.3 - 1.2 mg/dL   GFR, Estimated >60 >60 mL/min    Comment: (NOTE) Calculated using the CKD-EPI Creatinine Equation (2021)    Anion gap 6 5 - 15    Comment: Performed at [facility], [place] [address], [place], [place] [address]  Magnesium     Status: None    Collection Time: 02/13/21  1:57 AM  Result Value Ref Range   Magnesium 2.4 1.7 - 2.4 mg/dL    Comment: Performed at [facility] [address], [place], [place] [address]  Phosphorus     Status: None   Collection Time: 02/13/21  1:57 AM  Result Value Ref Range   Phosphorus 2.5 2.5 - 4.6 mg/dL    Comment: Performed at [facility] [address], [place], [place] [address]  Glucose, capillary     Status: Abnormal   Collection Time: 02/13/21  3:33 AM  Result Value Ref Range   Glucose-Capillary 156 (H) 70 - 99 mg/dL    Comment: Glucose reference range applies only to samples taken after fasting for at least 8 hours.    Recent Results (from the past 240 hour(s))  Urine Culture     Status: Abnormal   Collection Time: 02/07/21  6:08 AM   Specimen: Urine, Clean Catch  Result Value Ref Range Status   Specimen Description URINE, CLEAN CATCH  Final   Special Requests   Final    NONE Performed at [facility], [address] [place], [place] [address]    Culture MULTIPLE SPECIES PRESENT, SUGGEST RECOLLECTION (A)  Final   Report Status 02/08/2021 FINAL  Final  Resp Panel by RT-PCR (Flu A&B, Covid) Nasopharyngeal Swab     Status: None   Collection Time: 02/07/21 10:12 AM   Specimen: Nasopharyngeal Swab; Nasopharyngeal(NP) swabs in vial transport medium  Result Value Ref Range Status   SARS Coronavirus 2 by RT PCR NEGATIVE NEGATIVE Final    Comment: (NOTE) SARS-CoV-2 target nucleic acids are NOT DETECTED.  The SARS-CoV-2 RNA is generally detectable in upper respiratory specimens during the acute phase of infection. The lowest concentration of SARS-CoV-2 viral copies this assay can detect is 138 copies/mL. A negative result does not preclude SARS-Cov-2 infection and should not be used as the sole basis for treatment or other patient management decisions. A negative result may occur with  improper specimen collection/handling, submission of specimen other than  nasopharyngeal swab, presence  of viral mutation(s) within the areas targeted by this assay, and inadequate number of viral copies(<138 copies/mL). A negative result must be combined with clinical observations, patient history, and epidemiological information. The expected result is Negative.  Fact Sheet for Patients:  EntrepreneurPulse.com.au  Fact Sheet for Healthcare Providers:  IncredibleEmployment.be  This test is no t yet approved or cleared by the [place] FDA and  has been authorized for detection and/or diagnosis of SARS-CoV-2 by FDA under an Emergency Use Authorization (EUA). This EUA will remain  in effect (meaning this test can be used) for the duration of the COVID-19 declaration under Section 564(b)(1) of the Act, 21 U.S.C.section 360bbb-3(b)(1), unless the authorization is terminated  or revoked sooner.       Influenza A by PCR NEGATIVE NEGATIVE Final   Influenza B by PCR NEGATIVE NEGATIVE Final    Comment: (NOTE) The Xpert Xpress SARS-CoV-2/FLU/RSV plus assay is intended as an aid in the diagnosis of influenza from Nasopharyngeal swab specimens and should not be used as a sole basis for treatment. Nasal washings and aspirates are unacceptable for Xpert Xpress SARS-CoV-2/FLU/RSV testing.  Fact Sheet for Patients: EntrepreneurPulse.com.au  Fact Sheet for Healthcare Providers: IncredibleEmployment.be  This test is not yet approved or cleared by the [place] FDA and has been authorized for detection and/or diagnosis of SARS-CoV-2 by FDA under an Emergency Use Authorization (EUA). This EUA will remain in effect (meaning this test can be used) for the duration of the COVID-19 declaration under Section 564(b)(1) of the Act, 21 U.S.C. section 360bbb-3(b)(1), unless the authorization is terminated or revoked.  Performed at [facility], [place] [address] [place], [place] [address]    CSF culture w Gram Stain     Status: None   Collection Time: 02/08/21  3:31 PM   Specimen: CSF; Cerebrospinal Fluid  Result Value Ref Range Status   Specimen Description CSF  Final   Special Requests NONE  Final   Gram Stain   Final    WBC PRESENT, PREDOMINANTLY MONONUCLEAR NO ORGANISMS SEEN CYTOSPIN SMEAR    Culture   Final    NO GROWTH 3 DAYS Performed at [facility], [place] [address], [place], [place] [address]    Report Status 02/12/2021 FINAL  Final  Culture, fungus without smear     Status: None (Preliminary result)   Collection Time: 02/08/21  3:31 PM   Specimen: CSF; Cerebrospinal Fluid  Result Value Ref Range Status   Specimen Description CSF  Final   Special Requests NONE  Final   Culture   Final    NO GROWTH 4 DAYS Performed at [facility], [place] [address], [place], [place] [address]    Report Status PENDING  Incomplete  VZV PCR, CSF     Status: None   Collection Time: 02/08/21  3:31 PM   Specimen: Cerebrospinal Fluid  Result Value Ref Range Status   VZV PCR, CSF Negative Negative Final    Comment: (NOTE) No Varicella Zoster Virus DNA detected. Performed At: [facility] [place], [place] [id number] Rush Farmer MD [id number]     Lipid Panel No results for input(s): CHOL, TRIG, HDL, CHOLHDL, VLDL, LDLCALC in the last 72 hours.  Studies/Results: CT CHEST ABDOMEN PELVIS W CONTRAST  Result Date: 02/13/2021 CLINICAL DATA:  Concern for malignancy. EXAM: CT CHEST, ABDOMEN, AND PELVIS WITH CONTRAST TECHNIQUE: Multidetector CT imaging of the chest, abdomen and pelvis was performed following the standard protocol during bolus administration  of intravenous contrast. CONTRAST:  155mL OMNIPAQUE IOHEXOL 350 MG/ML SOLN COMPARISON:  None. FINDINGS: CT CHEST FINDINGS Cardiovascular: There is no cardiomegaly or pericardial effusion. The thoracic aorta is unremarkable. The central pulmonary arteries appear patent. Mediastinum/Nodes: No  hilar or mediastinal adenopathy. An enteric tube noted within the esophagus extends into the distal stomach. No mediastinal fluid collection. Lungs/Pleura: The lungs are clear. There is no pleural effusion pneumothorax. The central airways are patent. Musculoskeletal: No acute osseous pathology. CT ABDOMEN PELVIS FINDINGS No intra-abdominal free air.  Trace free fluid within the pelvis. Hepatobiliary: No focal liver abnormality is seen. No gallstones, gallbladder wall thickening, or biliary dilatation. Pancreas: Unremarkable. No pancreatic ductal dilatation or surrounding inflammatory changes. Spleen: Normal in size without focal abnormality. Adrenals/Urinary Tract: Adrenal glands are unremarkable. Kidneys are normal, without renal calculi, focal lesion, or hydronephrosis. Bladder is unremarkable. Stomach/Bowel: Enteric tube with tip in the distal stomach. Oral contrast noted in the colon. No bowel obstruction or active inflammation. The appendix is normal. Vascular/Lymphatic: The abdominal aorta and IVC are unremarkable. No portal venous gas. There is no adenopathy. Reproductive: The uterus and ovaries are grossly unremarkable. Other: None Musculoskeletal: No acute or significant osseous findings. IMPRESSION: No acute intrathoracic, abdominal, or pelvic pathology. No evidence of malignancy. Electronically Signed   By: Anner Crete M.D.   On: 02/13/2021 01:02   DG Abd Portable 1V  Result Date: 02/12/2021 CLINICAL DATA:  Feeding tube placement EXAM: PORTABLE ABDOMEN - 1 VIEW COMPARISON:  02/09/2021 FINDINGS: Feeding tube terminates in the region of the distal stomach. Visualized lung bases are clear. No dilute the bowel seen in the visualized abdomen. IMPRESSION: Feeding tube terminates in the region of the distal stomach. Electronically Signed   By: Miachel Roux M.D.   On: 02/12/2021 14:50    Medications: Scheduled:  free water  30 mL Per Tube Q4H   levothyroxine  50 mcg Intravenous Daily   LORazepam   2 mg Intravenous Q6H   pantoprazole (PROTONIX) IV  40 mg Intravenous QHS   [START ON 02/16/2021] thiamine  100 mg Oral Daily   Continuous:  feeding supplement (OSMOLITE 1.2 CAL) 1,000 mL (02/13/21 0045)   methylPREDNISolone (SOLU-MEDROL) injection 1,000 mg (02/12/21 0917)   thiamine injection 500 mg (02/12/21 2130)    Assessment: 20 y.o. female with PMH significant for Hashimoto thyroiditis who presented to the ED 10/15 with vomiting, headache, and decreased PO intake for 4 days with decreased verbalization starting on 10/15. 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 She has received 4/5 doses of daily IV methylprednisolone, with fifth dose today. 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  - Exam today is essentially unchanged from yesterday.   - MRI brain:  No acute infarction, hemorrhage, hydrocephalus, extra-axial collection or mass lesion. Hippocampi are symmetric in size/signal within normal limits on coronal thin imaging. Incomplete study due to patient tolerance. Only DWI/ADC, axial T2/FLAIR, coronal thins, and sagittal T1 obtained.   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 Of note, approximately 80% of Hashimoto encephalopathy patients have an abnormal LP. CSF findings with lymphocytic pleocytosis, mildly elevated protein, and normal glucose. 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 However, she does not have clinically evident seizures or myoclonus.    -  Given her young age and female gender, as well as clinical features, anti-NMDA receptor enceophalitis is also on the DDx. Per UpToDate, recommendations, IV  Solumedrol plus either IVIG or PLEX is used for initial treatment.  - Multiple other paraneoplastic encephalitides are also documented in the literature and an extensive list of such was reviewed in UpToDate. The other syndromes either have features not consistent with this patient's presentation, or have a median age of occurrence that is significantly higher than the age of our patient (elderly populations).   - Per UpToDate: Nonspecific electroencephalographic (EEG) abnormalities are seen in 90 to 98 percent of Hashimoto encephalopathy patients, usually demonstrating nonspecific slowing of background activity; focal spikes or sharp waves and transient epileptic activity are less common; triphasic waves and frontal intermittent rhythmic delta activity (FIRDA) have also been described. The patient's EEG on 10/17 revealed moderate diffuse encephalopathy of nonspecific etiology without seizures or epileptiform discharges.  - Regarding Hashimoto encephalopathy treatment, per UpToDate: "While most patients (90 to 98 percent) respond to glucocorticoid therapy, recovery may be incomplete. In one series of 24 patients, only 32 percent obtained a complete response to steroids. Response to glucocorticoids was not predicted by clinical features or antibody levels. A small number (5 percent) of reported patients have been treated with other immunosuppressive medications, including azathioprine, cyclophosphamide, methotrexate, rituximab, and hydroxychloroquine. These are generally reserved for patients who cannot tolerate glucocorticoids, or those who do not respond to or relapse after or during tapering of glucocorticoid therapy. Clinical improvement with intravenous immune globulin and plasmapheresis has been reported in individual cases." - HIV nonreactive.  - CSF labs:  - Appearance: clear and colorless - WBC markedly elevated at 318 with 94% lymphocytes, 0 neutrophils - Protein elevated at 50 - IgG elevated at  10.5.  - IgG-albumin ratio elevated at 0.4.  - IgG index elevated at 1.4.              - CSF bacterial culture NG x 3 days             - Fungal culture: no fungus isolated after 4 days             - VZV PCR negative - CT of chest/abdomen/pelvis (10/22): No acute intrathoracic, abdominal, or pelvic pathology. No evidence of malignancy. - Skin was warm and clammy today which is new, worried that she may be trying to spike a fever. No neck stiffness. Added cryptococcal Ag to recent CSF sample. No growth to date of fungal or bacterial cultures and gram stain is negative.    Recommendations: - A serum NMDA Ab titer was reordered by Neurology on Thursday as a send out to [facility].  - Continue Solu-Medrol therapy 1,000 mg IV daily for 5 days + PPI. Today is day 5. - On Wednesday (10/19), discussed addition of IVIG versus PLEX with IM Team given the possibility of anti-NMDA receptor encephalitis. The consensus between Neurology and IM Team is to add IVIG to her treatment regimen at 400 mg/kg qd x 5 days. I explained to the patient's mother earlier this week how IVIG is a blood product that contains pooled antibodies from multiple donors as well as mechanism of action as an immunomodulatory agent. Family was initially refusing IVIG as it is a blood product and they feel that blood products generally are forbidden by their religion (a protestant denomination in [place]); however, after further discussion with Neurology attending, the patient's mother tentatively agreed to start on Saturday or Sunday after completion of  IV methylprednisolone. - Today, after a 40 minute discussion with the patient's mother about relative risks/benefits of PLEX versus IVIG, the patient's mother has consented to start IVIG today. Risks of IVIG including anaphylaxis and blood clots were discussed. Risks of PLEX including hypotension, fatigue and line infection were also discussed. Second discussion was had at 12:30 PM with a friend of  her family present who assisted with interpretation, and mother again agreed to IVIG and consented to use of a blood product. Mother prefers IVIG over PLEX as the former is a 5 day course and the latter, being QOD, would require a minimum of 9 days in addition to potential delays required for placement of a central line. All questions answered.  - At one point during the private discussion with mother in conference room initially, she suddenly genuflected tearfully at the feet of the doctor before he could react. She was compassionately and politely helped back up to her chair and it was explained to her that the MD felt that this was not helpful behavior in this instance. A pastoral consult was discussed with her and has been ordered.  - Viral encephalitis felt to be low probability given lack of fever and Hashimoto's encephalopathy being significantly more likely. EEG findings not suggestive of HSV and CSF findings with small number of RBC most consistent with expected minimal venous blood in CSF sample rather than hemorrhage from HSV.  - Bacterial meningitis unlikely given no meningismus and no neutrophils in CSF sample - Psychiatry is also following regarding her catatonia. Agree with benzodiazepine trial: 2 mg Ativan IV q6h. Also has developing torticollis.  - Remote Dermatology consult pending for rash on thighs. The rash started before steroid rx was initiated.  - Given lack of po intake and lack of full horizontal EOM on exam, there is some likelihood of thiamine deficiency. High-dose thiamine protocol with 500 mg IV TID x 3 days, then 100 mg po thereafter.  - Repeat MRI brain with and without contrast to assess for possible abnormal enhancement (initial brain MRI was abbreviated study, and without contrast) - Repeat EEG to assess for possible subclinical seizure activity - Cryptococcal antigen has been added on to prior CSF collection - ID is being consulted today for second opinion.  - Pastoral  consult to counsel and pray with patient's mother at her request.  - Neurology will continue to follow   70 minutes spent in the neurological evaluation and management of this critically ill patient. Greater than 50% of the time spent was in counseling and discussion with the family of the patient's working diagnosis, further diagnostic plan, treatment plan and prognosis.    LOS: 6 days   @Electronically  signed: Dr. Kerney Elbe 02/13/2021  7:33 AM

## 2021-02-14 LAB — COMPREHENSIVE METABOLIC PANEL
ALT: 13 U/L (ref 0–44)
AST: 18 U/L (ref 15–41)
Albumin: 3 g/dL — ABNORMAL LOW (ref 3.5–5.0)
Alkaline Phosphatase: 33 U/L — ABNORMAL LOW (ref 38–126)
Anion gap: 6 (ref 5–15)
BUN: 19 mg/dL (ref 6–20)
CO2: 26 mmol/L (ref 22–32)
Calcium: 8.6 mg/dL — ABNORMAL LOW (ref 8.9–10.3)
Chloride: 103 mmol/L (ref 98–111)
Creatinine, Ser: 0.67 mg/dL (ref 0.44–1.00)
GFR, Estimated: >60 mL/min (ref >60)
Glucose, Bld: 189 mg/dL — ABNORMAL HIGH (ref 70–99)
Potassium: 3.6 mmol/L (ref 3.5–5.1)
Sodium: 135 mmol/L (ref 135–145)
Total Bilirubin: 0.8 mg/dL (ref 0.3–1.2)
Total Protein: 6.8 g/dL (ref 6.5–8.1)

## 2021-02-14 LAB — CBC WITH DIFFERENTIAL/PLATELET
Abs Immature Granulocytes: 0.1 10*3/uL — ABNORMAL HIGH (ref 0.00–0.07)
Basophils Absolute: 0 10*3/uL (ref 0.0–0.1)
Basophils Relative: 0 %
Eosinophils Absolute: 0 10*3/uL (ref 0.0–0.5)
Eosinophils Relative: 0 %
HCT: 34.7 % — ABNORMAL LOW (ref 36.0–46.0)
Hemoglobin: 11.3 g/dL — ABNORMAL LOW (ref 12.0–15.0)
Immature Granulocytes: 1 %
Lymphocytes Relative: 7 %
Lymphs Abs: 0.5 10*3/uL — ABNORMAL LOW (ref 0.7–4.0)
MCH: 29.9 pg (ref 26.0–34.0)
MCHC: 32.6 g/dL (ref 30.0–36.0)
MCV: 91.8 fL (ref 80.0–100.0)
Monocytes Absolute: 0.2 10*3/uL (ref 0.1–1.0)
Monocytes Relative: 3 %
Neutro Abs: 6.5 10*3/uL (ref 1.7–7.7)
Neutrophils Relative %: 89 %
Platelets: 295 10*3/uL (ref 150–400)
RBC: 3.78 MIL/uL — ABNORMAL LOW (ref 3.87–5.11)
RDW: 12.9 % (ref 11.5–15.5)
WBC: 7.3 10*3/uL (ref 4.0–10.5)
nRBC: 0 % (ref 0.0–0.2)

## 2021-02-14 LAB — MISC LABCORP TEST (SEND OUT)
Labcorp test code: 1382889
Labcorp test code: 138693

## 2021-02-14 LAB — PHOSPHORUS: Phosphorus: 2.6 mg/dL (ref 2.5–4.6)

## 2021-02-14 LAB — GLUCOSE, CAPILLARY
Glucose-Capillary: 126 mg/dL — ABNORMAL HIGH (ref 70–99)
Glucose-Capillary: 134 mg/dL — ABNORMAL HIGH (ref 70–99)
Glucose-Capillary: 135 mg/dL — ABNORMAL HIGH (ref 70–99)
Glucose-Capillary: 136 mg/dL — ABNORMAL HIGH (ref 70–99)
Glucose-Capillary: 138 mg/dL — ABNORMAL HIGH (ref 70–99)
Glucose-Capillary: 197 mg/dL — ABNORMAL HIGH (ref 70–99)
Glucose-Capillary: 96 mg/dL (ref 70–99)

## 2021-02-14 LAB — MAGNESIUM: Magnesium: 2.5 mg/dL — ABNORMAL HIGH (ref 1.7–2.4)

## 2021-02-14 LAB — RPR: RPR Ser Ql: NONREACTIVE

## 2021-02-14 MED ORDER — POTASSIUM CHLORIDE IN NACL 20-0.9 MEQ/L-% IV SOLN
INTRAVENOUS | Status: AC
  Filled 2021-02-14 (×2): qty 1000

## 2021-02-14 MED ORDER — SODIUM CHLORIDE 0.9 % IV SOLN: INTRAVENOUS | Status: DC

## 2021-02-14 MED ORDER — CHLORHEXIDINE GLUCONATE CLOTH 2 % EX PADS
6.0000 | MEDICATED_PAD | Freq: Every day | CUTANEOUS | Status: DC
  Administered 2021-02-14 – 2021-03-16 (×36): 6 via TOPICAL

## 2021-02-14 NOTE — Progress Notes (Signed)
0300 pt family member called RN to room pt active, moving arms around, alert and making noises. 0400 ativan given per order, no changes noted but pt resting. 0550 pt bladder noted distended, pt has not voided since midnight. Bladder scanned 811mls noted. Provider paged to inform. [phone] received order to insert indwelling foley.

## 2021-02-14 NOTE — Plan of Care (Signed)
Pt is resting, family present at bedside. Pt tolerating feedings well. Oral care completed. Pt noted pushing away when positioning her for foley insertion, pushing against with both legs. Foley inserted for urinary retention.    Problem: Safety: Goal: Non-violent Restraint(s) Outcome: Progressing   Problem: Education: Goal: Knowledge of General Education information will improve Description: Including pain rating scale, medication(s)/side effects and non-pharmacologic comfort measures Outcome: Progressing   Problem: Health Behavior/Discharge Planning: Goal: Ability to manage health-related needs will improve Outcome: Progressing   Problem: Clinical Measurements: Goal: Ability to maintain clinical measurements within normal limits will improve Outcome: Progressing Goal: Will remain free from infection Outcome: Progressing Goal: Diagnostic test results will improve Outcome: Progressing Goal: Respiratory complications will improve Outcome: Progressing Goal: Cardiovascular complication will be avoided Outcome: Progressing   Problem: Activity: Goal: Risk for activity intolerance will decrease Outcome: Progressing   Problem: Nutrition: Goal: Adequate nutrition will be maintained Outcome: Progressing   Problem: Coping: Goal: Level of anxiety will decrease Outcome: Progressing   Problem: Elimination: Goal: Will not experience complications related to bowel motility Outcome: Progressing Goal: Will not experience complications related to urinary retention Outcome: Progressing   Problem: Pain Managment: Goal: General experience of comfort will improve Outcome: Progressing   Problem: Safety: Goal: Ability to remain free from injury will improve Outcome: Progressing   Problem: Skin Integrity: Goal: Risk for impaired skin integrity will decrease Outcome: Progressing

## 2021-02-14 NOTE — Progress Notes (Addendum)
Brief HPI: 20 y.o. 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 intake for 4 days and decreased verbalization 10/15 prior to arrival.  Initial work-up revealed a mildly elevated creatinine, TSH of 10.09, negative ETOH, and an abnormally elevated thyroperoxidase Ab of 268.    Excerpt from 2016 clinic note with Endocrinology: "Krystol was seen in her PCP office in September 2016 for her 14 year [facility] and to have a sports physical done. At that visit they discussed that she was not yet menarchal. Labs revealed hypothyroidism with TSH 110 and free T4 0.3 (obtained 01/18/2014). She was started on 25 mcg of Synthroid and referred to endocrinology for further evaluation and management.   Her first visit to [facility] was 02/23/2015, at which time TFTs had improved but continued to be abnormal (TSH 25.065, FT4 0.46 with elevated thyroglobulin Ab and TPO Ab).  Levothyroxine dose was increased to 76mcg daily at that time."   Per Hospitalist team, about 5 years ago, per records, the patient's anti-TPO Ab titer was > 900.  Subjective: Overnight patient noted to be active with flailing arms with moaning vocalizations and was given Ativan.  Patient also had urinary retention with > 800 mL of urine requiring indwelling foley insertion.   Objective: Current vital signs: BP 98/74 (BP Location: Right Arm)   Pulse (!) 50   Temp 97.6 F (36.4 C) (Axillary)   Resp 15   Wt 58.9 kg   LMP 01/11/2021 Comment: neg preg test  SpO2 100%   BMI 23.73 kg/m  Vital signs in last 24 hours: Temp:  [97.6 F (36.4 C)-98.9 F (37.2 C)] 97.6 F (36.4 C) (10/23 0747) Pulse Rate:  [50-79] 50 (10/23 0747) Resp:  [15-20] 15 (10/23 0747) BP: (91-117)/(63-74) 98/74 (10/23 0747) SpO2:  [97 %-100 %] 100 % (10/23 0747)  Intake/Output from previous day: 10/22 0701 - 10/23 0700 In: 0  Out: 1100 [Urine:1100] Intake/Output this shift: No intake/output data  recorded. Nutritional status:  Diet Order             Diet regular Room service appropriate? Yes; Fluid consistency: Thin  Diet effective now                  HEENT: [place]/AT. Neck is supple.  Lungs: Respirations unlabored  Neurologic Exam: Mental Status: She is somnolent and does not arouse to noxious stimuli. Some spontaneous slow upper extremity movements were noted R > L. She does not follow commands. Nonverbal with no attempts to communicate. Does not gaze towards or away from visual stimuli with examiner eye opening. She does not open eyes throughout assessment.  CN: PERRL. Did not track or fixate. Eyes are conjugate and slowly roving with examiner eye opening. Face symmetric with decreased tone, does not grimace. Motor: Decreased tone throughout. Does not follow commands.  Minimal withdrawal throughout with consistent application of noxious stimuli without noted asymmetry. Sensory: Reacts to noxious plantar stimulation.  Reflexes: 2+ bilateral brachioradialis and patellae. 3-4 beats clonus with dorsiflexion of right foot, 4-5 beats to left foot. Toes downgoing bilaterally Cerebellar/Gait: Unable to assess  Lab Results: Results for orders placed or performed during the hospital encounter of 02/06/21 (from the past 48 hour(s))  Glucose, capillary     Status: Abnormal   Collection Time: 02/12/21  8:40 PM  Result Value Ref Range   Glucose-Capillary 171 (H) 70 - 99 mg/dL    Comment: Glucose reference range applies only to samples  taken after fasting for at least 8 hours.  Glucose, capillary     Status: Abnormal   Collection Time: 02/12/21 11:34 PM  Result Value Ref Range   Glucose-Capillary 152 (H) 70 - 99 mg/dL    Comment: Glucose reference range applies only to samples taken after fasting for at least 8 hours.  CBC with Differential/Platelet     Status: Abnormal   Collection Time: 02/13/21  1:57 AM  Result Value Ref Range   WBC 5.8 4.0 - 10.5 K/uL   RBC 3.64 (L) 3.87 - 5.11  MIL/uL   Hemoglobin 10.9 (L) 12.0 - 15.0 g/dL   HCT 33.6 (L) 36.0 - 46.0 %   MCV 92.3 80.0 - 100.0 fL   MCH 29.9 26.0 - 34.0 pg   MCHC 32.4 30.0 - 36.0 g/dL   RDW 13.0 11.5 - 15.5 %   Platelets 290 150 - 400 K/uL   nRBC 0.0 0.0 - 0.2 %   Neutrophils Relative % 83 %   Neutro Abs 4.8 1.7 - 7.7 K/uL   Lymphocytes Relative 11 %   Lymphs Abs 0.6 (L) 0.7 - 4.0 K/uL   Monocytes Relative 5 %   Monocytes Absolute 0.3 0.1 - 1.0 K/uL   Eosinophils Relative 0 %   Eosinophils Absolute 0.0 0.0 - 0.5 K/uL   Basophils Relative 0 %   Basophils Absolute 0.0 0.0 - 0.1 K/uL   Immature Granulocytes 1 %   Abs Immature Granulocytes 0.08 (H) 0.00 - 0.07 K/uL    Comment: Performed at [facility], [address], [place], [place] [address]  Comprehensive metabolic panel     Status: Abnormal   Collection Time: 02/13/21  1:57 AM  Result Value Ref Range   Sodium 137 135 - 145 mmol/L   Potassium 3.8 3.5 - 5.1 mmol/L   Chloride 105 98 - 111 mmol/L   CO2 26 22 - 32 mmol/L   Glucose, Bld 149 (H) 70 - 99 mg/dL    Comment: Glucose reference range applies only to samples taken after fasting for at least 8 hours.   BUN 24 (H) 6 - 20 mg/dL   Creatinine, Ser 0.73 0.44 - 1.00 mg/dL   Calcium 8.9 8.9 - 10.3 mg/dL   Total Protein 6.3 (L) 6.5 - 8.1 g/dL   Albumin 3.2 (L) 3.5 - 5.0 g/dL   AST 16 15 - 41 U/L   ALT 13 0 - 44 U/L   Alkaline Phosphatase 29 (L) 38 - 126 U/L   Total Bilirubin 0.6 0.3 - 1.2 mg/dL   GFR, Estimated >60 >60 mL/min    Comment: (NOTE) Calculated using the CKD-EPI Creatinine Equation (2021)    Anion gap 6 5 - 15    Comment: Performed at [facility], [place] [address], [place], [place] [address]  Magnesium     Status: None   Collection Time: 02/13/21  1:57 AM  Result Value Ref Range   Magnesium 2.4 1.7 - 2.4 mg/dL    Comment: Performed at [facility] [address] [place], [place] [address]  Phosphorus     Status: None   Collection Time: 02/13/21  1:57 AM  Result  Value Ref Range   Phosphorus 2.5 2.5 - 4.6 mg/dL    Comment: Performed at [facility] [address], [place], [place] [address]  Glucose, capillary     Status: Abnormal   Collection Time: 02/13/21  3:33 AM  Result Value Ref Range  Glucose-Capillary 156 (H) 70 - 99 mg/dL    Comment: Glucose reference range applies only to samples taken after fasting for at least 8 hours.  Glucose, capillary     Status: Abnormal   Collection Time: 02/13/21  8:08 AM  Result Value Ref Range   Glucose-Capillary 150 (H) 70 - 99 mg/dL    Comment: Glucose reference range applies only to samples taken after fasting for at least 8 hours.  Glucose, capillary     Status: Abnormal   Collection Time: 02/13/21 12:06 PM  Result Value Ref Range   Glucose-Capillary 133 (H) 70 - 99 mg/dL    Comment: Glucose reference range applies only to samples taken after fasting for at least 8 hours.  Glucose, capillary     Status: Abnormal   Collection Time: 02/13/21  4:51 PM  Result Value Ref Range   Glucose-Capillary 167 (H) 70 - 99 mg/dL    Comment: Glucose reference range applies only to samples taken after fasting for at least 8 hours.  Glucose, capillary     Status: Abnormal   Collection Time: 02/13/21  9:02 PM  Result Value Ref Range   Glucose-Capillary 180 (H) 70 - 99 mg/dL    Comment: Glucose reference range applies only to samples taken after fasting for at least 8 hours.  Glucose, capillary     Status: Abnormal   Collection Time: 02/14/21 12:14 AM  Result Value Ref Range   Glucose-Capillary 135 (H) 70 - 99 mg/dL    Comment: Glucose reference range applies only to samples taken after fasting for at least 8 hours.  Glucose, capillary     Status: Abnormal   Collection Time: 02/14/21  3:11 AM  Result Value Ref Range   Glucose-Capillary 197 (H) 70 - 99 mg/dL    Comment: Glucose reference range applies only to samples taken after fasting for at least 8 hours.  CBC with Differential/Platelet     Status: Abnormal    Collection Time: 02/14/21  3:39 AM  Result Value Ref Range   WBC 7.3 4.0 - 10.5 K/uL   RBC 3.78 (L) 3.87 - 5.11 MIL/uL   Hemoglobin 11.3 (L) 12.0 - 15.0 g/dL   HCT 34.7 (L) 36.0 - 46.0 %   MCV 91.8 80.0 - 100.0 fL   MCH 29.9 26.0 - 34.0 pg   MCHC 32.6 30.0 - 36.0 g/dL   RDW 12.9 11.5 - 15.5 %   Platelets 295 150 - 400 K/uL   nRBC 0.0 0.0 - 0.2 %   Neutrophils Relative % 89 %   Neutro Abs 6.5 1.7 - 7.7 K/uL   Lymphocytes Relative 7 %   Lymphs Abs 0.5 (L) 0.7 - 4.0 K/uL   Monocytes Relative 3 %   Monocytes Absolute 0.2 0.1 - 1.0 K/uL   Eosinophils Relative 0 %   Eosinophils Absolute 0.0 0.0 - 0.5 K/uL   Basophils Relative 0 %   Basophils Absolute 0.0 0.0 - 0.1 K/uL   Immature Granulocytes 1 %   Abs Immature Granulocytes 0.10 (H) 0.00 - 0.07 K/uL    Comment: Performed at [facility], [address] [place], [place] [address]  Comprehensive metabolic panel     Status: Abnormal   Collection Time: 02/14/21  3:39 AM  Result Value Ref Range   Sodium 135 135 - 145 mmol/L   Potassium 3.6 3.5 - 5.1 mmol/L   Chloride 103 98 - 111 mmol/L   CO2 26 22 - 32 mmol/L   Glucose,  Bld 189 (H) 70 - 99 mg/dL    Comment: Glucose reference range applies only to samples taken after fasting for at least 8 hours.   BUN 19 6 - 20 mg/dL   Creatinine, Ser 0.67 0.44 - 1.00 mg/dL   Calcium 8.6 (L) 8.9 - 10.3 mg/dL   Total Protein 6.8 6.5 - 8.1 g/dL   Albumin 3.0 (L) 3.5 - 5.0 g/dL   AST 18 15 - 41 U/L   ALT 13 0 - 44 U/L   Alkaline Phosphatase 33 (L) 38 - 126 U/L   Total Bilirubin 0.8 0.3 - 1.2 mg/dL   GFR, Estimated >60 >60 mL/min    Comment: (NOTE) Calculated using the CKD-EPI Creatinine Equation (2021)    Anion gap 6 5 - 15    Comment: Performed at [facility], [place] [address] [place], [place] [address]  Magnesium     Status: Abnormal   Collection Time: 02/14/21  3:39 AM  Result Value Ref Range   Magnesium 2.5 (H) 1.7 - 2.4 mg/dL    Comment: Performed at [facility] [address] [place], [place] [address]  Phosphorus     Status: None   Collection Time: 02/14/21  3:39 AM  Result Value Ref Range   Phosphorus 2.6 2.5 - 4.6 mg/dL    Comment: Performed at [facility] [address], [place], [place] [address]  Glucose, capillary     Status: Abnormal   Collection Time: 02/14/21  7:40 AM  Result Value Ref Range   Glucose-Capillary 136 (H) 70 - 99 mg/dL    Comment: Glucose reference range applies only to samples taken after fasting for at least 8 hours.    Recent Results (from the past 240 hour(s))  Urine Culture     Status: Abnormal   Collection Time: 02/07/21  6:08 AM   Specimen: Urine, Clean Catch  Result Value Ref Range Status   Specimen Description URINE, CLEAN CATCH  Final   Special Requests   Final    NONE Performed at [facility], [address], [place], [place] [address]    Culture MULTIPLE SPECIES PRESENT, SUGGEST RECOLLECTION (A)  Final   Report Status 02/08/2021 FINAL  Final  Resp Panel by RT-PCR (Flu A&B, Covid) Nasopharyngeal Swab     Status: None   Collection Time: 02/07/21 10:12 AM   Specimen: Nasopharyngeal Swab; Nasopharyngeal(NP) swabs in vial transport medium  Result Value Ref Range Status   SARS Coronavirus 2 by RT PCR NEGATIVE NEGATIVE Final    Comment: (NOTE) SARS-CoV-2 target nucleic acids are NOT DETECTED.  The SARS-CoV-2 RNA is generally detectable in upper respiratory specimens during the acute phase of infection. The lowest concentration of SARS-CoV-2 viral copies this assay can detect is 138 copies/mL. A negative result does not preclude SARS-Cov-2 infection and should not be used as the sole basis for treatment or other patient management decisions. A negative result may occur with  improper specimen collection/handling, submission of specimen other than nasopharyngeal swab, presence of viral mutation(s) within the areas targeted by this assay, and inadequate number of viral copies(<138  copies/mL). A negative result must be combined with clinical observations, patient history, and epidemiological information. The expected result is Negative.  Fact Sheet for Patients:  EntrepreneurPulse.com.au  Fact Sheet for Healthcare Providers:  IncredibleEmployment.be  This test is no t yet approved or cleared by the [place] FDA and  has been authorized for detection and/or diagnosis of SARS-CoV-2  by FDA under an Emergency Use Authorization (EUA). This EUA will remain  in effect (meaning this test can be used) for the duration of the COVID-19 declaration under Section 564(b)(1) of the Act, 21 U.S.C.section 360bbb-3(b)(1), unless the authorization is terminated  or revoked sooner.       Influenza A by PCR NEGATIVE NEGATIVE Final   Influenza B by PCR NEGATIVE NEGATIVE Final    Comment: (NOTE) The Xpert Xpress SARS-CoV-2/FLU/RSV plus assay is intended as an aid in the diagnosis of influenza from Nasopharyngeal swab specimens and should not be used as a sole basis for treatment. Nasal washings and aspirates are unacceptable for Xpert Xpress SARS-CoV-2/FLU/RSV testing.  Fact Sheet for Patients: EntrepreneurPulse.com.au  Fact Sheet for Healthcare Providers: IncredibleEmployment.be  This test is not yet approved or cleared by the [place] FDA and has been authorized for detection and/or diagnosis of SARS-CoV-2 by FDA under an Emergency Use Authorization (EUA). This EUA will remain in effect (meaning this test can be used) for the duration of the COVID-19 declaration under Section 564(b)(1) of the Act, 21 U.S.C. section 360bbb-3(b)(1), unless the authorization is terminated or revoked.  Performed at [facility], [place] [address] [place], [place] [address]   CSF culture w Gram Stain     Status: None   Collection Time: 02/08/21  3:31 PM   Specimen: CSF; Cerebrospinal Fluid  Result Value  Ref Range Status   Specimen Description CSF  Final   Special Requests NONE  Final   Gram Stain   Final    WBC PRESENT, PREDOMINANTLY MONONUCLEAR NO ORGANISMS SEEN CYTOSPIN SMEAR    Culture   Final    NO GROWTH 3 DAYS Performed at [facility], [place] [address], [place], [place] [address]    Report Status 02/12/2021 FINAL  Final  Culture, fungus without smear     Status: None (Preliminary result)   Collection Time: 02/08/21  3:31 PM   Specimen: CSF; Cerebrospinal Fluid  Result Value Ref Range Status   Specimen Description CSF  Final   Special Requests NONE  Final   Culture   Final    NO FUNGUS ISOLATED AFTER 5 DAYS Performed at [facility], [place] [address] [place], [place] [address]    Report Status PENDING  Incomplete  VZV PCR, CSF     Status: None   Collection Time: 02/08/21  3:31 PM   Specimen: Cerebrospinal Fluid  Result Value Ref Range Status   VZV PCR, CSF Negative Negative Final    Comment: (NOTE) No Varicella Zoster Virus DNA detected. Performed At: [facility] [place], [place] [id number] Rush Farmer MD [id number]     Lipid Panel No results for input(s): CHOL, TRIG, HDL, CHOLHDL, VLDL, LDLCALC in the last 72 hours.  Studies/Results: MR BRAIN W WO CONTRAST  Result Date: 02/13/2021 CLINICAL DATA:  Mental status change, CNS infection suspected EXAM: MRI HEAD WITHOUT AND WITH CONTRAST TECHNIQUE: Multiplanar, multiecho pulse sequences of the brain and surrounding structures were obtained without and with intravenous contrast. CONTRAST:  70mL GADAVIST GADOBUTROL 1 MMOL/ML IV SOLN COMPARISON:  02/09/2021 FINDINGS: Brain: No acute infarction, hemorrhage, hydrocephalus, extra-axial collection, or mass lesion. The ventricles and sulci are within normal limits for age. No abnormal enhancement. No foci of hemosiderin deposition to suggest remote hemorrhage. Vascular: Normal flow voids. Skull and upper cervical spine: Normal marrow signal.  Sinuses/Orbits: Negative. Other: The mastoids are well aerated. IMPRESSION: Normal MRI of brain. Electronically Signed  By: Merilyn Baba M.D.   On: 02/13/2021 22:47   CT CHEST ABDOMEN PELVIS W CONTRAST  Result Date: 02/13/2021 CLINICAL DATA:  Concern for malignancy. EXAM: CT CHEST, ABDOMEN, AND PELVIS WITH CONTRAST TECHNIQUE: Multidetector CT imaging of the chest, abdomen and pelvis was performed following the standard protocol during bolus administration of intravenous contrast. CONTRAST:  166mL OMNIPAQUE IOHEXOL 350 MG/ML SOLN COMPARISON:  None. FINDINGS: CT CHEST FINDINGS Cardiovascular: There is no cardiomegaly or pericardial effusion. The thoracic aorta is unremarkable. The central pulmonary arteries appear patent. Mediastinum/Nodes: No hilar or mediastinal adenopathy. An enteric tube noted within the esophagus extends into the distal stomach. No mediastinal fluid collection. Lungs/Pleura: The lungs are clear. There is no pleural effusion pneumothorax. The central airways are patent. Musculoskeletal: No acute osseous pathology. CT ABDOMEN PELVIS FINDINGS No intra-abdominal free air.  Trace free fluid within the pelvis. Hepatobiliary: No focal liver abnormality is seen. No gallstones, gallbladder wall thickening, or biliary dilatation. Pancreas: Unremarkable. No pancreatic ductal dilatation or surrounding inflammatory changes. Spleen: Normal in size without focal abnormality. Adrenals/Urinary Tract: Adrenal glands are unremarkable. Kidneys are normal, without renal calculi, focal lesion, or hydronephrosis. Bladder is unremarkable. Stomach/Bowel: Enteric tube with tip in the distal stomach. Oral contrast noted in the colon. No bowel obstruction or active inflammation. The appendix is normal. Vascular/Lymphatic: The abdominal aorta and IVC are unremarkable. No portal venous gas. There is no adenopathy. Reproductive: The uterus and ovaries are grossly unremarkable. Other: None Musculoskeletal: No acute or  significant osseous findings. IMPRESSION: No acute intrathoracic, abdominal, or pelvic pathology. No evidence of malignancy. Electronically Signed   By: Anner Crete M.D.   On: 02/13/2021 01:02   DG Abd Portable 1V  Result Date: 02/12/2021 CLINICAL DATA:  Feeding tube placement EXAM: PORTABLE ABDOMEN - 1 VIEW COMPARISON:  02/09/2021 FINDINGS: Feeding tube terminates in the region of the distal stomach. Visualized lung bases are clear. No dilute the bowel seen in the visualized abdomen. IMPRESSION: Feeding tube terminates in the region of the distal stomach. Electronically Signed   By: Miachel Roux M.D.   On: 02/12/2021 14:50   EEG adult  Result Date: 02/13/2021 Lora Havens, MD     02/13/2021  6:34 PM Patient Name: Brooklinn Longbottom MRN: [id number] Epilepsy Attending: Lora Havens Referring Physician/Provider: Dr Kerney Elbe Date: 10/22/ 2022 Duration: 22.39 mins  Patient history:  20 y.o. female with PMH significant for Hashimoto thyroiditis who presents with vomiting, headache and not eating for 4 days and not talking since today. EEG to evaluate for seizure  Level of alertness: lethargic, asleep  AEDs during EEG study: None  Technical aspects: This EEG study was done with scalp electrodes positioned according to the 10-20 International system of electrode placement. Electrical activity was acquired at a sampling rate of 500Hz  and reviewed with a high frequency filter of 70Hz  and a low frequency filter of 1Hz . EEG data were recorded continuously and digitally stored.  Description: EEG showed continuous generalized rhythmic 2-3 Hz  sharply contoured delta slowing. Sleep was characterized by sleep spindles (12-14hz ), maximal frontocentral region. Sharp transient was noted in left temporal region.Hyperventilation and photic stimulation were not performed.    ABNORMALITY - Generalized rhythmic delta activity ( GRDA)  IMPRESSION: This study showed generalized rhythmic delta activity which is on  the ictal-interictal continuum with low potential for seizures. There is also severe diffuse encephalopathy, nonspecific etiology. No seizures or definite epileptiform discharges were seen throughout the recording. If concern for ictal-interictal activity persists, please  consider long term monitoring.  Priyanka [name]    Medications: Scheduled:  free water  30 mL Per Tube Q4H   levothyroxine  100 mcg Per Tube Q0600   pantoprazole sodium  40 mg Per Tube QHS   [START ON 02/16/2021] thiamine  100 mg Per Tube Daily   Continuous:  feeding supplement (OSMOLITE 1.2 CAL) 1,000 mL (02/13/21 0045)   Immune Globulin 10% 25 g (02/13/21 1324)   thiamine injection 500 mg (02/13/21 2044)    Repeat EEG 10/22:  Abnormality: Generalized rhythmic delta activity ( GRDA) Impression: This study showed generalized rhythmic delta activity which is on the ictal-interictal continuum with low potential for seizures. There is also severe diffuse encephalopathy, nonspecific etiology. No seizures or definite epileptiform discharges were seen throughout the recording. If concern for ictal-interictal activity persists, please consider long term monitoring.  Assessment: 20 y.o. female with PMH significant for Hashimoto thyroiditis who presented to the ED 10/15 with vomiting, headache, and decreased PO intake for 4 days with decreased verbalization starting on 10/15. 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 She has received 5/5 doses of daily IV methylprednisolone with course completed on Saturday. Given lack of improvement on Solumedrol, she was started on a 5 day course of IVIG on Saturday.   - Exam today is not significantly changed relative to yesterday, with ongoing somnolence.  - Imaging: - MRI brain (10/18): No acute infarction, hemorrhage, hydrocephalus, extra-axial collection or  mass lesion. Hippocampi are symmetric in size/signal within normal limits on coronal thin imaging. Incomplete study due to patient tolerance. Only DWI/ADC, axial T2/FLAIR, coronal thins, and sagittal T1 obtained.   - Repeat MRI brain (10/22):  Read as normal by Radiology. On review of images by the neurology team, there appears to be subtle enhancement of the rostrally-to-caudally oriented tracts in the region of the superior and inferior colliculi bilaterally, which is of uncertain significance.  - Repeat EEG (10/22): This study showed generalized rhythmic delta activity which is on the ictal-interictal continuum with low potential for seizures. There is also severe diffuse encephalopathy, nonspecific etiology. No seizures or definite epileptiform discharges were seen throughout the recording. The findings show worsening relative to the initial EEG from 10/17.  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 Of note, approximately 80% of Hashimoto encephalopathy patients have an abnormal LP. CSF findings with lymphocytic pleocytosis, mildly elevated protein, and normal glucose. 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 However, she does not have clinically evident seizures or myoclonus.    - Given her young age and female gender, as well as clinical features, anti-NMDA receptor enceophalitis is also on the DDx. Per UpToDate, recommendations, IV Solumedrol plus either IVIG or PLEX is used for initial treatment.  - Multiple other paraneoplastic encephalitides are also documented in the literature and an extensive list of such was reviewed in UpToDate. The other syndromes either have features not consistent with this patient's presentation, or have a median age of occurrence that is significantly higher than the age of our patient (elderly populations).    - Other paraneoplastic and autoimmune syndromes could manifest with an encephalitis; however, due to partial overlap of clinical features and their rarity, they are felt to be unlikely; to further assess, an Autoimmune Encephalopathy Panel has been ordered from [facility] as a  send out.   - The patient has no evidence for brainstem inflammation on repeat MRI other than subtle enhancement of the posterior colliculi on axial post-contrast images, which was not described on the official report, but which is seen on review by the Neurology team. Given this subtle MRI finding, Bickerstaff's brainstem enceplalitis (BBE), which is rare, would be a consideration. BBE typically presents with the triad of ophthalmoparesis, ataxia and confusion; the anti-Gq1b antibody may have a role in the pathophysiology of BBE, although more commonly this antibody is associated with Miller-Fisher syndrome.   - Viral encephalitis felt to be low probability given lack of fever and Hashimoto's encephalopathy being significantly more likely. EEG findings not suggestive of HSV and CSF findings with small number of RBC most consistent with expected minimal venous blood in CSF sample rather than hemorrhage from HSV.  - Bacterial meningitis unlikely given no meningismus and no neutrophils in CSF sample - Per UpToDate: Nonspecific electroencephalographic (EEG) abnormalities are seen in 90 to 98 percent of Hashimoto encephalopathy patients, usually demonstrating nonspecific slowing of background activity; focal spikes or sharp waves and transient epileptic activity are less common; triphasic waves and frontal intermittent rhythmic delta activity (FIRDA) have also been described. The patient's EEG on 10/17 revealed moderate diffuse encephalopathy of nonspecific etiology without seizures or epileptiform discharges.  - Regarding Hashimoto encephalopathy treatment, per UpToDate: "While most patients (90 to 98 percent) respond to glucocorticoid  therapy, recovery may be incomplete. In one series of 24 patients, only 32 percent obtained a complete response to steroids. Response to glucocorticoids was not predicted by clinical features or antibody levels. A small number (5 percent) of reported patients have been treated with other immunosuppressive medications, including azathioprine, cyclophosphamide, methotrexate, rituximab, and hydroxychloroquine. These are generally reserved for patients who cannot tolerate glucocorticoids, or those who do not respond to or relapse after or during tapering of glucocorticoid therapy. Clinical improvement with intravenous immune globulin and plasmapheresis has been reported in individual cases." - HIV nonreactive.  - CSF labs:  - Appearance: clear and colorless - WBC markedly elevated at 318 with 94% lymphocytes, 0 neutrophils - Protein elevated at 50 - IgG elevated at 10.5.  - IgG-albumin ratio elevated at 0.4.  - IgG index elevated at 1.4.              - CSF bacterial culture NG x 3 days             - Fungal culture: no fungus isolated after 4 days             - VZV PCR negative  - Cryptococcal antigen negative - CT of chest/abdomen/pelvis (10/22): No acute intrathoracic, abdominal, or pelvic pathology. No evidence of malignancy.  Recommendations: - A serum NMDA Ab titer was reordered by Neurology on Thursday as a send out to [facility].  - Completed 5 days of IV Solu-Medrol 10/22 - On day 2/5 of IVIG.  - Remote Dermatology consult pending for rash on thighs. The rash started before steroid rx was initiated.  - High-dose thiamine protocol with 500 mg IV TID x 3 days, then 100 mg po thereafter.  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 Anti-Gq1b antibody also has been ordered.  - Discontinuing scheduled Ativan as it has not had an effect on her catatonia and most likely is having  sedating effects that may cloud the exam findings.  -  LTM EEG has been ordered.  - EBV PCR add-on study to CSF has been ordered - Neurology will continue to follow   40 minutes spent in the neurological evaluation and management of this patient. > 50% of time was spent discussing the diagnostic and treatment plan with the patient's mother.    LOS: 7 days   @Electronically  signed: Dr. Kerney Elbe 02/14/2021  7:48 AM

## 2021-02-14 NOTE — Progress Notes (Signed)
Patient's RN found that patient was flushed on face. No temp. Recommended to inform the MD this matter and it is okay to administer IVIG at this time. RN paging MD and MD ordered it is okay to infuse IVIG since no temp. Administered IVIG followed by MD. Duaine Dredge RN

## 2021-02-14 NOTE — Consult Note (Signed)
Attempted to see patient twice today; however,  on attempts patient was interacting with patient care personnel or Neurology was meeting with patient and family at bedside. Chart reviewed-based off of documentation, it does not appear that patient has had a favorable response to ativan for suspected catatonia as it has led to sedation. Agree with discontinuation at this time. Psychiatry to follow up tomorrow.

## 2021-02-14 NOTE — Progress Notes (Signed)
HD#7 Subjective:  Overnight Events: Foley catheter placed  Sleeping in bed comfortably.  Does have periodic twitching of right hand and right side of mouth.  No other acute events.  Objective:  Vital signs in last 24 hours: Vitals:   02/13/21 1514 02/13/21 1945 02/14/21 0016 02/14/21 0312  BP: 107/67 106/65 101/72 106/72  Pulse:  70 61 75  Resp:  19 19 18   Temp:  98.9 F (37.2 C) 98.7 F (37.1 C) 97.8 F (36.6 C)  TempSrc:  Axillary Axillary Oral  SpO2:  99% 97% 100%  Weight:       Supplemental O2: Room Air SpO2: 100 %  Physical Exam:  Constitutional: No acute distress HENT: normocephalic atraumatic Eyes: Pupils dilated, reactive to light Neck: supple Cardiovascular: regular rate and rhythm Pulmonary/Chest: normal work of breathing on room air Abdominal: soft, non-distended MSK: normal bulk and tone Neurological: Not arousable but with spontaneous movements of her right upper extremity with facial twitching.  Nonverbal, does not follow commands.  She does not gaze with any type of stimuli.  Facial grimacing randomly.  Toes downgoing bilaterally. Skin:-Hyper pigmented rash on lower extremities  Filed Weights   02/12/21 1409  Weight: 58.9 kg     Intake/Output Summary (Last 24 hours) at 02/14/2021 0550 Last data filed at 02/13/2021 1800 Gross per 24 hour  Intake 56.25 ml  Output 450 ml  Net -393.75 ml    Net IO Since Admission: 2,418.82 mL [02/14/21 0550]  Pertinent Labs: CBC Latest Ref Rng & Units 02/14/2021 02/13/2021 02/12/2021  WBC 4.0 - 10.5 K/uL 7.3 5.8 9.5  Hemoglobin 12.0 - 15.0 g/dL 11.3(L) 10.9(L) 11.3(L)  Hematocrit 36.0 - 46.0 % 34.7(L) 33.6(L) 34.9(L)  Platelets 150 - 400 K/uL 295 290 321    CMP Latest Ref Rng & Units 02/14/2021 02/13/2021 02/12/2021  Glucose 70 - 99 mg/dL 189(H) 149(H) 134(H)  BUN 6 - 20 mg/dL 19 24(H) 23(H)  Creatinine 0.44 - 1.00 mg/dL 0.67 0.73 0.80  Sodium 135 - 145 mmol/L 135 137 139  Potassium 3.5 - 5.1 mmol/L  3.6 3.8 3.9  Chloride 98 - 111 mmol/L 103 105 107  CO2 22 - 32 mmol/L 26 26 25   Calcium 8.9 - 10.3 mg/dL 8.6(L) 8.9 9.4  Total Protein 6.5 - 8.1 g/dL 6.8 6.3(L) 6.7  Total Bilirubin 0.3 - 1.2 mg/dL 0.8 0.6 0.8  Alkaline Phos 38 - 126 U/L 33(L) 29(L) 32(L)  AST 15 - 41 U/L 18 16 21   ALT 0 - 44 U/L 13 13 13     Imaging: MR BRAIN W WO CONTRAST  Result Date: 02/13/2021 CLINICAL DATA:  Mental status change, CNS infection suspected EXAM: MRI HEAD WITHOUT AND WITH CONTRAST TECHNIQUE: Multiplanar, multiecho pulse sequences of the brain and surrounding structures were obtained without and with intravenous contrast. CONTRAST:  59mL GADAVIST GADOBUTROL 1 MMOL/ML IV SOLN COMPARISON:  02/09/2021 FINDINGS: Brain: No acute infarction, hemorrhage, hydrocephalus, extra-axial collection, or mass lesion. The ventricles and sulci are within normal limits for age. No abnormal enhancement. No foci of hemosiderin deposition to suggest remote hemorrhage. Vascular: Normal flow voids. Skull and upper cervical spine: Normal marrow signal. Sinuses/Orbits: Negative. Other: The mastoids are well aerated. IMPRESSION: Normal MRI of brain. Electronically Signed   By: Merilyn Baba M.D.   On: 02/13/2021 22:47   EEG adult  Result Date: 02/13/2021 Lora Havens, MD     02/13/2021  6:34 PM Patient Name: Jamie [name] MRN: [id number] Epilepsy Attending: Lora Havens Referring Physician/Provider:  Dr Kerney Elbe Date: 10/22/ 2022 Duration: 22.39 mins  Patient history:  20 y.o. female with PMH significant for Hashimoto thyroiditis who presents with vomiting, headache and not eating for 4 days and not talking since today. EEG to evaluate for seizure  Level of alertness: lethargic, asleep  AEDs during EEG study: None  Technical aspects: This EEG study was done with scalp electrodes positioned according to the 10-20 International system of electrode placement. Electrical activity was acquired at a sampling rate of 500Hz  and  reviewed with a high frequency filter of 70Hz  and a low frequency filter of 1Hz . EEG data were recorded continuously and digitally stored.  Description: EEG showed continuous generalized rhythmic 2-3 Hz  sharply contoured delta slowing. Sleep was characterized by sleep spindles (12-14hz ), maximal frontocentral region. Sharp transient was noted in left temporal region.Hyperventilation and photic stimulation were not performed.    ABNORMALITY - Generalized rhythmic delta activity ( GRDA)  IMPRESSION: This study showed generalized rhythmic delta activity which is on the ictal-interictal continuum with low potential for seizures. There is also severe diffuse encephalopathy, nonspecific etiology. No seizures or definite epileptiform discharges were seen throughout the recording. If concern for ictal-interictal activity persists, please consider long term monitoring.  Priyanka [name]    Assessment/Plan:   Principal Problem:   Encephalitis Active Problems:   Primary hypothyroidism   Catatonia associated with another mental disorder   Patient Summary: Jamie [name] is a 20 y.o. with a pertinent PMH of Hashimoto thyroiditis, who presented with confusion and admitted for encephalopathy.   Encephalopathy Suspicious for autoimmune encephalitis.  Completed 5-day course of IV methylprednisolone on 02/13/21. IVIG day 2.  No significant improvement on patient's exam, however she has been receiving Ativan and this may cloud her response to treatment thus far MRI with and without contrast negative for any acute pathology.  EEG with diffuse encephalopathy and ictal-interictal continuum low potential for seizures.  NMDA antibody receptors pending, CMV and EBV of CSF fluid pending.  HSV and VZV CSF negative.  Cryptococcal antigen negative. Neurology also adding autoantibody panel.  RPR negative and QuantiFERON gold pending. -Greatly appreciate neurology and ID's assistance -IVIG day 2/5, IV methylprednisolone day  5/5 -Ativan discontinued by neurology -NMDA receptor antibodies pending, CMV and EBV CSF fluid pending.  Autoantibody panel sent -ID consulted for further evaluation  Appreciate their recommendations. 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  Discontinued by neurology today.  Urinary Retention While patient periodically voided large amounts on her own, 2+ bladder scans with over 200cc's of urine. Foley catheter placed overnight.  -Foley placed  Hyperpigmented Macular Rash Patient with hyperpigmented rash on lower extremities, plan to refer to dermatology for further evaluation.  Rash present prior to initiation of steroid.  Unclear etiology at this time. -Dermatology consult pending  Hypothyroidism 2/2 Hashimoto Disease Continue levothyroxine 100 mcg daily   Malnutrition Secondary to encephalopathy and lack of p.o. intake.  Core track tube placed 02/12/21.  Magnesium and phosphate stable.  Continue tube feeds and to trend electrolytes. -Appreciate dietitian assistance recommendation. -thiamine daily -free water and feeding supplement daily  Diet: feeding supplement IVF: None,None VTE: SCDs, lovenox discontinued by neurology  Code: Full PT/OT recs:  Pending Family Update: Family at bedside  Dispo: Anticipated discharge pending further workup  [facility] Resident PGY-2 Pager [phone]  Please contact the on call pager after 5 pm and on weekends at [phone].

## 2021-02-15 ENCOUNTER — Inpatient Hospital Stay (HOSPITAL_COMMUNITY): Payer: Medicaid Other

## 2021-02-15 DIAGNOSIS — F061 Catatonic disorder due to known physiological condition: Secondary | ICD-10-CM | POA: Diagnosis not present

## 2021-02-15 DIAGNOSIS — R4182 Altered mental status, unspecified: Secondary | ICD-10-CM | POA: Diagnosis not present

## 2021-02-15 LAB — CBC WITH DIFFERENTIAL/PLATELET
Abs Immature Granulocytes: 0.08 10*3/uL — ABNORMAL HIGH (ref 0.00–0.07)
Basophils Absolute: 0 10*3/uL (ref 0.0–0.1)
Basophils Relative: 0 %
Eosinophils Absolute: 0 10*3/uL (ref 0.0–0.5)
Eosinophils Relative: 0 %
HCT: 33.9 % — ABNORMAL LOW (ref 36.0–46.0)
Hemoglobin: 10.8 g/dL — ABNORMAL LOW (ref 12.0–15.0)
Immature Granulocytes: 1 %
Lymphocytes Relative: 14 %
Lymphs Abs: 1 10*3/uL (ref 0.7–4.0)
MCH: 30.1 pg (ref 26.0–34.0)
MCHC: 31.9 g/dL (ref 30.0–36.0)
MCV: 94.4 fL (ref 80.0–100.0)
Monocytes Absolute: 0.7 10*3/uL (ref 0.1–1.0)
Monocytes Relative: 9 %
Neutro Abs: 5.7 10*3/uL (ref 1.7–7.7)
Neutrophils Relative %: 76 %
Platelets: 270 10*3/uL (ref 150–400)
RBC: 3.59 MIL/uL — ABNORMAL LOW (ref 3.87–5.11)
RDW: 12.5 % (ref 11.5–15.5)
WBC: 7.4 10*3/uL (ref 4.0–10.5)
nRBC: 0 % (ref 0.0–0.2)

## 2021-02-15 LAB — COMPREHENSIVE METABOLIC PANEL
ALT: 13 U/L (ref 0–44)
AST: 14 U/L — ABNORMAL LOW (ref 15–41)
Albumin: 2.6 g/dL — ABNORMAL LOW (ref 3.5–5.0)
Alkaline Phosphatase: 30 U/L — ABNORMAL LOW (ref 38–126)
Anion gap: 6 (ref 5–15)
BUN: 21 mg/dL — ABNORMAL HIGH (ref 6–20)
CO2: 24 mmol/L (ref 22–32)
Calcium: 8.4 mg/dL — ABNORMAL LOW (ref 8.9–10.3)
Chloride: 104 mmol/L (ref 98–111)
Creatinine, Ser: 0.63 mg/dL (ref 0.44–1.00)
GFR, Estimated: >60 mL/min (ref >60)
Glucose, Bld: 121 mg/dL — ABNORMAL HIGH (ref 70–99)
Potassium: 3.7 mmol/L (ref 3.5–5.1)
Sodium: 134 mmol/L — ABNORMAL LOW (ref 135–145)
Total Bilirubin: 0.4 mg/dL (ref 0.3–1.2)
Total Protein: 7.1 g/dL (ref 6.5–8.1)

## 2021-02-15 LAB — GLUCOSE, CAPILLARY
Glucose-Capillary: 120 mg/dL — ABNORMAL HIGH (ref 70–99)
Glucose-Capillary: 120 mg/dL — ABNORMAL HIGH (ref 70–99)
Glucose-Capillary: 121 mg/dL — ABNORMAL HIGH (ref 70–99)
Glucose-Capillary: 134 mg/dL — ABNORMAL HIGH (ref 70–99)
Glucose-Capillary: 99 mg/dL (ref 70–99)
Glucose-Capillary: 97 mg/dL (ref 70–99)

## 2021-02-15 LAB — MAGNESIUM: Magnesium: 2.1 mg/dL (ref 1.7–2.4)

## 2021-02-15 LAB — N-METHYL-D-ASPARTATE RECPT.IGG

## 2021-02-15 LAB — PHOSPHORUS: Phosphorus: 1.9 mg/dL — ABNORMAL LOW (ref 2.5–4.6)

## 2021-02-15 MED ORDER — SODIUM PHOSPHATES 45 MMOLE/15ML IV SOLN
15.0000 mmol | Freq: Once | INTRAVENOUS | Status: AC
  Administered 2021-02-15: 15 mmol via INTRAVENOUS
  Filled 2021-02-15: qty 5

## 2021-02-15 MED ORDER — LEVOTHYROXINE SODIUM 100 MCG/5ML IV SOLN
50.0000 ug | Freq: Every day | INTRAVENOUS | Status: DC
  Administered 2021-02-16 – 2021-02-17 (×2): 50 ug via INTRAVENOUS
  Filled 2021-02-15 (×4): qty 5

## 2021-02-15 MED ORDER — LACTATED RINGERS IV SOLN: INTRAVENOUS | Status: DC

## 2021-02-15 NOTE — Progress Notes (Signed)
Started cEEG study.  Notified [facility] monitoring.  Tested patient event button. 

## 2021-02-15 NOTE — Plan of Care (Signed)
Pt has has been more alert, openings eyes, but not tracking, through the night for short periods of time then goes back to sleep (snoring). Pt still not verbal. Pt family present at bedside.   Problem: Safety: Goal: Non-violent Restraint(s) Outcome: Progressing   Problem: Education: Goal: Knowledge of General Education information will improve Description: Including pain rating scale, medication(s)/side effects and non-pharmacologic comfort measures Outcome: Progressing   Problem: Health Behavior/Discharge Planning: Goal: Ability to manage health-related needs will improve Outcome: Progressing   Problem: Health Behavior/Discharge Planning: Goal: Ability to manage health-related needs will improve Outcome: Progressing   Problem: Clinical Measurements: Goal: Ability to maintain clinical measurements within normal limits will improve Outcome: Progressing Goal: Will remain free from infection Outcome: Progressing Goal: Diagnostic test results will improve Outcome: Progressing Goal: Respiratory complications will improve Outcome: Progressing Goal: Cardiovascular complication will be avoided Outcome: Progressing   Problem: Activity: Goal: Risk for activity intolerance will decrease Outcome: Progressing   Problem: Nutrition: Goal: Adequate nutrition will be maintained Outcome: Progressing   Problem: Coping: Goal: Level of anxiety will decrease Outcome: Progressing   Problem: Elimination: Goal: Will not experience complications related to bowel motility Outcome: Progressing Goal: Will not experience complications related to urinary retention Outcome: Progressing   Problem: Pain Managment: Goal: General experience of comfort will improve Outcome: Progressing   Problem: Safety: Goal: Ability to remain free from injury will improve Outcome: Progressing   Problem: Skin Integrity: Goal: Risk for impaired skin integrity will decrease Outcome: Progressing

## 2021-02-15 NOTE — Progress Notes (Signed)
   02/15/21 2115  Assess: MEWS Score  Temp 99.7 F (37.6 C)  BP 99/62  Pulse Rate 84  ECG Heart Rate 84  Resp 19  Level of Consciousness Responds to Voice  SpO2 97 %  O2 Device Room Air  Assess: MEWS Score  MEWS Temp 0  MEWS Systolic 1  MEWS Pulse 0  MEWS RR 0  MEWS LOC 1  MEWS Score 2  MEWS Score Color Yellow  Assess: if the MEWS score is Yellow or Red  Were vital signs taken at a resting state? Yes  Focused Assessment No change from prior assessment  Early Detection of Sepsis Score *See Row Information* Low  MEWS guidelines implemented *See Row Information* Yes  Treat  Pain Scale Faces  Breathing 1  Negative Vocalization 0  Facial Expression 0  Body Language 0  Consolability 0  PAINAD Score 1  Notify: Charge Nurse/RN  Name of Charge Nurse/RN Notified Wells Guiles RN  Date Charge Nurse/RN Notified 02/15/21  Time Charge Nurse/RN Notified 2123  Notify: Provider  Provider Name/Title Resident pager  [phone]  Date Provider Notified 02/15/21  Time Provider Notified 2127  Notification Type Page  Notification Reason Other (Comment) (yellow mews; LOC and SBP 99)   At 2315 yellow MEWs patient reviewed with [facility] RN Shanon Brow

## 2021-02-15 NOTE — Progress Notes (Signed)
   02/15/21 1230  Clinical Encounter Type  Visited With Patient and family together  Visit Type Initial  Referral From Nurse  Consult/Referral To Chaplain   Chaplain responded to the consult request. The patient's mother was at her bedside, and the attending nurse, Nicholes Stairs. The mother said her daughter had a sitter who constantly prayed for the patient and would like the sitter or a Protestant sitter to return and remain with the patient. This chaplain asked reflective questions to better understand her spiritual/religious beliefs. This chaplain prayed with the mother and the patient. Advised her Chaplains remain available for follow-up spiritual/emotional support as needed.This note was prepared by Jeanine Luz, M.Div..  For questions please contact by phone [phone].

## 2021-02-15 NOTE — Progress Notes (Signed)
LTM maint complete - no skin breakdown under: Fp1 F3F7 .[facility] monitored, Event button test confirmed by [facility].  

## 2021-02-15 NOTE — Plan of Care (Signed)
  Problem: Safety: Goal: Non-violent Restraint(s) Outcome: Progressing   Problem: Clinical Measurements: Goal: Ability to maintain clinical measurements within normal limits will improve Outcome: Progressing Goal: Will remain free from infection Outcome: Progressing Goal: Diagnostic test results will improve Outcome: Progressing Goal: Respiratory complications will improve Outcome: Progressing Goal: Cardiovascular complication will be avoided Outcome: Progressing

## 2021-02-15 NOTE — Consult Note (Signed)
[facility] Face-to-Face Psychiatry Consult   Reason for Consult:  Concern for catatonia  Referring Physician:  Gerlene Fee, MD Patient Identification: Jamie [name] MRN:  [id number] Principal Diagnosis: Encephalitis Diagnosis:  Principal Problem:   Encephalitis Active Problems:   Primary hypothyroidism   Catatonia associated with another mental disorder  Assessment  Jamie [name] is a 20 y.o. female admitted medically for acute altered mental status with mutism with recent history of nausea and vomiting.  02/06/2021  2:17 PM patient carries no known past psychiatric history.  Patient carries known past medical history of Hashimoto's thyroiditis.  Psychiatry was consulted for acute change in altered mental status with concern for catatonia.  Patient was prescribed Synthroid outpatient; however, per EMR patient's mother endorsed patient had not been compliant.  On initial examination, patient is only able to answer very few questions with words and mostly responds with a high-pitched "hmm" as if she does not know what was asked. We plan to do an Ativan trial for possible catatonia as well as start low-dose antipsychotics to help with severe agitation that has been noted during hospitalization.   Patient had a family friend in room who endorsed the patient's mental status appears to wax and wane.  Increasing concern for delirium related to medical illness.  However, patient was able to endorse being in pain in her upper extremities.  We will attempt chemical restraints to help decrease frequency of severe agitation outbursts leading to physical restraints.   10/18: Seen with resident who wrote original consult note; agree with assessement and plan. Deferred ativan trial (pt was supposed to get ativan prior to MRI, was going to have nurse message to evaluate before/after does not appear to have gotten it). 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 Will likely do formal ativan challenge tomorrow if pt still unable to take PO - although agree with neurology that tx of underlying medical cause at this time is of primary importance, pts with catatonia 2/2 encephalitis can still often have some degree of response to standard treatments for catatonia such as lorazepam.    10/19: Patient appears less interactive today and did not appear to get agitated during visit. Patient had family at bedside who endorsed that patient's presentation continues to wax and wane. Overnight patient required assessment by primary team who noted increased rigidity in patient and patient was noted to have increased rigidity in her BLE and mild rigidity in her biceps. Patient scored a 10 on the Bush- Francis scale.  This is again concerning for catatonia. Patient is requiring physical restraints due episodes of hyperdelirium and there is concern that continued frequent use of these in conjunction with muscle rigidity could lead to muscle breakdown. It remains possible that patient's catatonic like presentation is 2/2 to an autoimmune encephalitis; however it may be beneficial to treat both underlying cause and symptomatic management of of more emergent catatonia symptoms to decrease chances of further decompensation.    10/21: Patient very difficult to arouse this AM. Attempted to see patient again in the PM. Patient was more responsive in the PM. Patient appeared to struggle to open her eyes, but appeared to attempt to interact to commands and let out a whimper. Patient had a Coretrack place as she has not PO intake in at least 2 days. BFCRS prior to Ativan 14. Post-  Ativan: Patient opened her eyes wider and able to maintain and blink, patient attempting to lift head and able to move both arms and squeeze both hands on command.  Attempted to communicate with patient via blinking; however this was inconsistent. Patient was also able to move L foot on command. Patient had decreased rigidity in BLE. Patient's positive response to Ativan trial remains convincing that patient is currently catatonic and this may be 2/2 encephalitis.    10/22: Patient sitting in bed this morning with mitt on left hand. Neurology and IM at bedside as well.Slow spontaneous upper extremity movements noted.  . Patient unable to follow commands this morning, not able to  squeeze hands or open eyes when asked. No documentation how how patient responded to ativan overnight. Spoke with nurse and requested documentation of response to ativan and to pass along to night team as this will provide helpful clinical information.  10/24: On assessment today patient appears worse.  Patient's lower extremities appear to be more rigid than during Ativan trial.  At this time we do recommend the Ativan be reconsidered post EEG.  Per EMR there appeared to be concern for oversedation over the weekend however, EMR also notes that patient was able to move her limbs with more intention.  On assessment today patient is not moving any of her limbs with intention and appears to have stereotypical movements.  Although formal both Dub Mikes catatonia scale was not done today, today was the first time where stereotypical movement were noted on exam and patient's rigidity has markedly increased.  Patient also was not able to follow any commands on assessment today.  If Ativan is restarted to help decrease lower extremity rigidity and risk for contractures could consider restarting at a lower dosage.   Labs reviewed:CBC, CMP  EKG-QTC 420 LP-02/08/2021-HSV- neg, oligoclonal bands- neg, anti- nMDA and VZV pending-neg; Protein-50 and Glucose 68, IgG 10.5, WBC 273   Plan Concern for catatonia (r/o autoimmune encephalitis, r/o MDD) -Autoimmune encephalitis work-up per neurology -Consider  rheumatology consult - Recommend- Agitation protocol- Zyprexa 2.5mg  PRN (DO NOT GIVE within 30 min of Ativan due to risk of resp depression)  - EKG 10/18 Qtc 420 - CT Chest/ Abdomen/ Pelvis: negative - MRI of head: No intracranial abnormalities noted   Thank you for this consult.  Psychiatry will continue to follow-will see tomorrow  Total Time spent with patient: 15 minutes  Subjective:   Jamie [name] is a 20 y.o. female,  [place] female patient admitted with acute altered mental status and recent history of nausea and vomiting.  Patient has no known psychiatric history but does have a history of Hashimoto's thyroiditis. Marland Kitchen  HPI: On assessment today Haveman does not respond to verbal or tactile stimuli.  Patient is noted to be connected to continuous EEG.  Patient's mother is in the room.  Patient's mother endorses that patient was a bit more responsive over the weekend and had some movement up to her elbow and her right arm.  Past Medical History:  Past Medical History:  Diagnosis Date  . Acquired autoimmune hypothyroidism    Dx 12/2014, TSH 110, FT4 0.3   History reviewed. No pertinent surgical history. Family History:  Family History  Problem Relation Age of Onset  . Healthy Mother   . Healthy Father     Social History:  Social History   Substance and Sexual Activity  Alcohol Use Not Currently     Social History   Substance and Sexual Activity  Drug Use Not Currently    Social History   Socioeconomic History  . Marital status: Single    Spouse name: Not on file  . Number of children: Not on file  . Years of education: Not on file  . Highest education level: Not on file  Occupational History  . Not on file  Tobacco Use  . Smoking status: Never  . Smokeless tobacco: Never  Substance and Sexual Activity  . Alcohol use: Not Currently  . Drug use: Not Currently  . Sexual activity: Not on file  Other Topics Concern  . Not on file  Social  History Narrative   Lives at home with mom and maternal grandmother and two siblings attends [employer] school is in the 8th grade.    Social Determinants of Health   Financial Resource Strain: Not on file  Food Insecurity: Not on file  Transportation Needs: Not on file  Physical Activity: Not on file  Stress: Not on file  Social Connections: Not on file   Additional Social History:    Allergies:  No Known Allergies  Labs:  Results for orders placed or performed during the hospital encounter of 02/06/21 (from the past 48 hour(s))  Glucose, capillary     Status: Abnormal   Collection Time: 02/13/21  9:02 PM  Result Value Ref Range   Glucose-Capillary 180 (H) 70 - 99 mg/dL    Comment: Glucose reference range applies only to samples taken after fasting for at least 8 hours.  Glucose, capillary     Status: Abnormal   Collection Time: 02/14/21 12:14 AM  Result Value Ref Range   Glucose-Capillary 135 (H) 70 - 99 mg/dL    Comment: Glucose reference range applies only to samples taken after fasting for at least 8 hours.  Glucose, capillary     Status: Abnormal   Collection Time: 02/14/21  3:11 AM  Result Value Ref Range   Glucose-Capillary 197 (H) 70 - 99 mg/dL    Comment: Glucose reference range applies only to samples taken after fasting for at least 8 hours.  CBC with Differential/Platelet     Status: Abnormal   Collection Time: 02/14/21  3:39 AM  Result Value Ref Range   WBC 7.3 4.0 - 10.5 K/uL   RBC 3.78 (L) 3.87 - 5.11 MIL/uL   Hemoglobin 11.3 (L) 12.0 - 15.0 g/dL   HCT 34.7 (L) 36.0 - 46.0 %   MCV 91.8 80.0 - 100.0 fL   MCH 29.9 26.0 - 34.0 pg   MCHC 32.6 30.0 - 36.0 g/dL   RDW 12.9 11.5 - 15.5 %   Platelets 295 150 - 400 K/uL   nRBC 0.0 0.0 - 0.2 %   Neutrophils Relative % 89 %   Neutro Abs 6.5 1.7 - 7.7 K/uL   Lymphocytes Relative 7 %   Lymphs Abs 0.5 (L) 0.7 - 4.0 K/uL   Monocytes Relative 3 %   Monocytes Absolute 0.2 0.1 - 1.0 K/uL   Eosinophils Relative  0 %   Eosinophils Absolute 0.0 0.0 - 0.5 K/uL   Basophils Relative 0 %   Basophils Absolute 0.0 0.0 - 0.1 K/uL   Immature Granulocytes 1 %   Abs Immature Granulocytes 0.10 (H) 0.00 - 0.07 K/uL    Comment: Performed at [facility], [address], [place], [place] [address]  Comprehensive metabolic panel     Status: Abnormal   Collection Time: 02/14/21  3:39 AM  Result Value Ref Range  Sodium 135 135 - 145 mmol/L   Potassium 3.6 3.5 - 5.1 mmol/L   Chloride 103 98 - 111 mmol/L   CO2 26 22 - 32 mmol/L   Glucose, Bld 189 (H) 70 - 99 mg/dL    Comment: Glucose reference range applies only to samples taken after fasting for at least 8 hours.   BUN 19 6 - 20 mg/dL   Creatinine, Ser 0.67 0.44 - 1.00 mg/dL   Calcium 8.6 (L) 8.9 - 10.3 mg/dL   Total Protein 6.8 6.5 - 8.1 g/dL   Albumin 3.0 (L) 3.5 - 5.0 g/dL   AST 18 15 - 41 U/L   ALT 13 0 - 44 U/L   Alkaline Phosphatase 33 (L) 38 - 126 U/L   Total Bilirubin 0.8 0.3 - 1.2 mg/dL   GFR, Estimated >60 >60 mL/min    Comment: (NOTE) Calculated using the CKD-EPI Creatinine Equation (2021)    Anion gap 6 5 - 15    Comment: Performed at [facility], [place] [address], [place], [place] [address]  Magnesium     Status: Abnormal   Collection Time: 02/14/21  3:39 AM  Result Value Ref Range   Magnesium 2.5 (H) 1.7 - 2.4 mg/dL    Comment: Performed at [facility] [address] [place], [place] [address]  Phosphorus     Status: None   Collection Time: 02/14/21  3:39 AM  Result Value Ref Range   Phosphorus 2.6 2.5 - 4.6 mg/dL    Comment: Performed at [facility] [address] [place], [place] [address]  Glucose, capillary     Status: Abnormal   Collection Time: 02/14/21  7:40 AM  Result Value Ref Range   Glucose-Capillary 136 (H) 70 - 99 mg/dL    Comment: Glucose reference range applies only to samples taken after fasting for at least 8 hours.  Glucose, capillary     Status: Abnormal   Collection Time: 02/14/21  11:49 AM  Result Value Ref Range   Glucose-Capillary 138 (H) 70 - 99 mg/dL    Comment: Glucose reference range applies only to samples taken after fasting for at least 8 hours.  Glucose, capillary     Status: Abnormal   Collection Time: 02/14/21  4:25 PM  Result Value Ref Range   Glucose-Capillary 134 (H) 70 - 99 mg/dL    Comment: Glucose reference range applies only to samples taken after fasting for at least 8 hours.  Glucose, capillary     Status: Abnormal   Collection Time: 02/14/21  7:33 PM  Result Value Ref Range   Glucose-Capillary 126 (H) 70 - 99 mg/dL    Comment: Glucose reference range applies only to samples taken after fasting for at least 8 hours.  Glucose, capillary     Status: None   Collection Time: 02/14/21 11:55 PM  Result Value Ref Range   Glucose-Capillary 96 70 - 99 mg/dL    Comment: Glucose reference range applies only to samples taken after fasting for at least 8 hours.  Glucose, capillary     Status: Abnormal   Collection Time: 02/15/21  3:53 AM  Result Value Ref Range   Glucose-Capillary 120 (H) 70 - 99 mg/dL    Comment: Glucose reference range applies only to samples taken after fasting for at least 8 hours.  CBC with Differential/Platelet     Status: Abnormal   Collection Time: 02/15/21  3:54 AM  Result Value Ref Range   WBC  7.4 4.0 - 10.5 K/uL   RBC 3.59 (L) 3.87 - 5.11 MIL/uL   Hemoglobin 10.8 (L) 12.0 - 15.0 g/dL   HCT 33.9 (L) 36.0 - 46.0 %   MCV 94.4 80.0 - 100.0 fL   MCH 30.1 26.0 - 34.0 pg   MCHC 31.9 30.0 - 36.0 g/dL   RDW 12.5 11.5 - 15.5 %   Platelets 270 150 - 400 K/uL   nRBC 0.0 0.0 - 0.2 %   Neutrophils Relative % 76 %   Neutro Abs 5.7 1.7 - 7.7 K/uL   Lymphocytes Relative 14 %   Lymphs Abs 1.0 0.7 - 4.0 K/uL   Monocytes Relative 9 %   Monocytes Absolute 0.7 0.1 - 1.0 K/uL   Eosinophils Relative 0 %   Eosinophils Absolute 0.0 0.0 - 0.5 K/uL   Basophils Relative 0 %   Basophils Absolute 0.0 0.0 - 0.1 K/uL   Immature Granulocytes 1  %   Abs Immature Granulocytes 0.08 (H) 0.00 - 0.07 K/uL    Comment: Performed at [facility] [address] [place], [place] [address]  Comprehensive metabolic panel     Status: Abnormal   Collection Time: 02/15/21  3:54 AM  Result Value Ref Range   Sodium 134 (L) 135 - 145 mmol/L   Potassium 3.7 3.5 - 5.1 mmol/L   Chloride 104 98 - 111 mmol/L   CO2 24 22 - 32 mmol/L   Glucose, Bld 121 (H) 70 - 99 mg/dL    Comment: Glucose reference range applies only to samples taken after fasting for at least 8 hours.   BUN 21 (H) 6 - 20 mg/dL   Creatinine, Ser 0.63 0.44 - 1.00 mg/dL   Calcium 8.4 (L) 8.9 - 10.3 mg/dL   Total Protein 7.1 6.5 - 8.1 g/dL   Albumin 2.6 (L) 3.5 - 5.0 g/dL   AST 14 (L) 15 - 41 U/L   ALT 13 0 - 44 U/L   Alkaline Phosphatase 30 (L) 38 - 126 U/L   Total Bilirubin 0.4 0.3 - 1.2 mg/dL   GFR, Estimated >60 >60 mL/min    Comment: (NOTE) Calculated using the CKD-EPI Creatinine Equation (2021)    Anion gap 6 5 - 15    Comment: Performed at [facility], [place] [address], [place], [place] [address]  Magnesium     Status: None   Collection Time: 02/15/21  3:54 AM  Result Value Ref Range   Magnesium 2.1 1.7 - 2.4 mg/dL    Comment: Performed at [facility] [address], [place], [place] [address]  Phosphorus     Status: Abnormal   Collection Time: 02/15/21  3:54 AM  Result Value Ref Range   Phosphorus 1.9 (L) 2.5 - 4.6 mg/dL    Comment: Performed at [facility] [address], [place], [place] [address]  Glucose, capillary     Status: Abnormal   Collection Time: 02/15/21  8:17 AM  Result Value Ref Range   Glucose-Capillary 120 (H) 70 - 99 mg/dL    Comment: Glucose reference range applies only to samples taken after fasting for at least 8 hours.   Comment 1 Notify RN    Comment 2 Document in Chart   Glucose, capillary     Status: Abnormal   Collection Time: 02/15/21 12:47 PM  Result Value Ref Range   Glucose-Capillary 121 (H) 70 - 99 mg/dL     Comment: Glucose reference range applies only  to samples taken after fasting for at least 8 hours.   Comment 1 Notify RN    Comment 2 Document in Chart   Glucose, capillary     Status: Abnormal   Collection Time: 02/15/21  4:24 PM  Result Value Ref Range   Glucose-Capillary 134 (H) 70 - 99 mg/dL    Comment: Glucose reference range applies only to samples taken after fasting for at least 8 hours.   Comment 1 Notify RN    Comment 2 Document in Chart     Current Facility-Administered Medications  Medication Dose Route Frequency Provider Last Rate Last Admin  . acetaminophen (TYLENOL) tablet 650 mg  650 mg Oral Q6H PRN Maudie Mercury, MD   650 mg at 02/14/21 0541   Or  . acetaminophen (TYLENOL) suppository 650 mg  650 mg Rectal Q6H PRN Maudie Mercury, MD      . Chlorhexidine Gluconate Cloth 2 % PADS 6 each  6 each Topical Daily Aldine Contes, MD   6 each at 02/15/21 1227  . feeding supplement (OSMOLITE 1.2 CAL) liquid 1,000 mL  1,000 mL Per Tube Continuous Axel Filler, MD   Stopped at 02/14/21 0910  . free water 30 mL  30 mL Per Tube Q4H Axel Filler, MD   30 mL at 02/15/21 1228  . Immune Globulin 10% (PRIVIGEN) IV infusion 25 g  400 mg/kg Intravenous Q24 Hr x 5 Kerney Elbe, MD 0 mL/hr at 02/14/21 1828 25 g at 02/15/21 1216  . levothyroxine (SYNTHROID) tablet 100 mcg  100 mcg Per Tube Q0600 Karren Cobble, RPH   100 mcg at 02/15/21 0543  . pantoprazole sodium (PROTONIX) 40 mg/20 mL oral suspension 40 mg  40 mg Per Tube QHS Karren Cobble, RPH   40 mg at 02/14/21 2225  . [START ON 02/16/2021] thiamine tablet 100 mg  100 mg Per Tube Daily Karren Cobble, [facility]         Psychiatric Specialty Exam: Patient remains in a catatonic-like state unable to do PSE. Presentation  General Appearance: Bizarre  Eye Contact:None  Speech:Other (comment) (non verbal)  Speech Volume:-- (non verbal)  Handedness:-- (unable to assess)   Mood and Affect   Mood:-- (unable to assess)  Affect:Flat   Thought Process  Thought Processes:-- (unable to assess)  Descriptions of Associations:-- (unable to assess)  Orientation:-- (unable to assess)  Thought Content:-- (unable to assess)  History of Schizophrenia/Schizoaffective disorder:No data recorded Duration of Psychotic Symptoms:No data recorded Hallucinations:No data recorded Ideas of Reference:-- (unable to assess)  Suicidal Thoughts:No data recorded Homicidal Thoughts:No data recorded  Sensorium  Memory:-- (unable to assess)  Judgment:-- (unable to assess)  Insight:-- (unable to assess)   Executive Functions  Concentration:-- (unable to assess)  Attention Span:-- (unable to assess)  Recall:-- (unable to assess)  Fund of Knowledge:-- (unable to assess)  Language:-- (unable to assess)   Psychomotor Activity  Psychomotor Activity:No data recorded  Assets  Assets:Social Support   Sleep  Sleep:No data recorded  Physical Exam: Physical Exam Musculoskeletal:     Comments: Stereotypical mvmts  Increased rigidity of BLE  Possible early contracture of w/ plantar flexion of the L foot  Skin:    General: Skin is warm and dry.     Comments: Rash on bilateral lower extremities is noted to have continued to descend more on the right lower extremity than the left.  Neurological:     Comments: Unresponsive to verbal or tactile stimuli    ROS Blood  pressure 127/88, pulse 93, temperature 99.1 F (37.3 C), temperature source Axillary, resp. rate (!) 21, weight 58.9 kg, last menstrual period 01/11/2021, SpO2 100 %. Body mass index is 23.73 kg/m.  PGY-2 Freida Busman, MD 02/15/2021 4:58 PM

## 2021-02-15 NOTE — Progress Notes (Signed)
[facility]    Date of Admission:  02/06/2021      ID: Jamie [name] is a 20 y.o. female with  hashimoto's admitted with encephalitis/catanoia Principal Problem:   Encephalitis Active Problems:   Primary hypothyroidism   Catatonia associated with another mental disorder    Subjective: Still not interactive. Remains on LT EEG monitoring  Medications:   Chlorhexidine Gluconate Cloth  6 each Topical Daily   free water  30 mL Per Tube Q4H   levothyroxine  100 mcg Per Tube Q0600   pantoprazole sodium  40 mg Per Tube QHS   [START ON 02/16/2021] thiamine  100 mg Per Tube Daily    Objective: Vital signs in last 24 hours: Temp:  [97.8 F (36.6 C)-99.8 F (37.7 C)] 99.1 F (37.3 C) (10/24 1320) Pulse Rate:  [75-97] 93 (10/24 1320) Resp:  [16-22] 21 (10/24 1258) BP: (104-127)/(67-88) 127/88 (10/24 1320) SpO2:  [99 %-100 %] 100 % (10/24 1320) Physical Exam  Constitutional:   appears well-nourished. No distress.  HENT: [place]/AT, PERRLA, no scleral icterus Mouth/Throat: Oropharynx is clear and moist. No oropharyngeal exudate.  Cardiovascular: Normal rate, regular rhythm and normal heart sounds. Exam reveals no gallop and no friction rub.  No murmur heard.  Pulmonary/Chest: Effort normal and breath sounds normal. No respiratory distress.  has no wheezes.  Lymphadenopathy: no cervical adenopathy. No axillary adenopathy Neurological: not following directions. Remains encephalopathicc Skin: Skin is warm and dry. No rash noted. No erythema.    Lab Results Recent Labs    02/14/21 0339 02/15/21 0354  WBC 7.3 7.4  HGB 11.3* 10.8*  HCT 34.7* 33.9*  NA 135 134*  K 3.6 3.7  CL 103 104  CO2 26 24  BUN 19 21*  CREATININE 0.67 0.63   Liver Panel Recent Labs    02/14/21 0339 02/15/21 0354  PROT 6.8 7.1  ALBUMIN 3.0* 2.6*  AST 18 14*  ALT 13 13  ALKPHOS 33* 30*  BILITOT 0.8 0.4   Sedimentation Rate No results for input(s): ESRSEDRATE in  the last 72 hours. C-Reactive Protein No results for input(s): CRP in the last 72 hours.  Microbiology:  CSF cx negative CSF meningitis panel pending Studies/Results: MR BRAIN W WO CONTRAST  Result Date: 02/13/2021 CLINICAL DATA:  Mental status change, CNS infection suspected EXAM: MRI HEAD WITHOUT AND WITH CONTRAST TECHNIQUE: Multiplanar, multiecho pulse sequences of the brain and surrounding structures were obtained without and with intravenous contrast. CONTRAST:  73mL GADAVIST GADOBUTROL 1 MMOL/ML IV SOLN COMPARISON:  02/09/2021 FINDINGS: Brain: No acute infarction, hemorrhage, hydrocephalus, extra-axial collection, or mass lesion. The ventricles and sulci are within normal limits for age. No abnormal enhancement. No foci of hemosiderin deposition to suggest remote hemorrhage. Vascular: Normal flow voids. Skull and upper cervical spine: Normal marrow signal. Sinuses/Orbits: Negative. Other: The mastoids are well aerated. IMPRESSION: Normal MRI of brain. Electronically Signed   By: Merilyn Baba M.D.   On: 02/13/2021 22:47   EEG adult  Result Date: 02/13/2021 Lora Havens, MD     02/13/2021  6:34 PM Patient Name: Jamie [name] MRN: [id number] Epilepsy Attending: Lora Havens Referring Physician/Provider: Dr Kerney Elbe Date: 10/22/ 2022 Duration: 22.39 mins  Patient history:  20 y.o. female with PMH significant for Hashimoto thyroiditis who presents with vomiting, headache and not eating for 4 days and not talking since today. EEG to evaluate for seizure  Level of alertness: lethargic, asleep  AEDs during EEG study: None  Technical aspects: This EEG study was done with scalp electrodes positioned according to the 10-20 International system of electrode placement. Electrical activity was acquired at a sampling rate of 500Hz  and reviewed with a high frequency filter of 70Hz  and a low frequency filter of 1Hz . EEG data were recorded continuously and digitally stored.  Description: EEG  showed continuous generalized rhythmic 2-3 Hz  sharply contoured delta slowing. Sleep was characterized by sleep spindles (12-14hz ), maximal frontocentral region. Sharp transient was noted in left temporal region.Hyperventilation and photic stimulation were not performed.    ABNORMALITY - Generalized rhythmic delta activity ( GRDA)  IMPRESSION: This study showed generalized rhythmic delta activity which is on the ictal-interictal continuum with low potential for seizures. There is also severe diffuse encephalopathy, nonspecific etiology. No seizures or definite epileptiform discharges were seen throughout the recording. If concern for ictal-interictal activity persists, please consider long term monitoring.  Lora Havens     Assessment/Plan: 20yo F with hashimoto's hypothyroid disease who is admitted for severe encephalopathy, catatonia.  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  - will still not recommend any antibiotics or anti-virals at this time and follow up on meningitis panel that was sent out. Defer to neurology for management. - will sign off  [facility] Pager: [phone]  02/15/2021, 1:44 PM

## 2021-02-15 NOTE — Progress Notes (Signed)
[facility] code received from provider [id number], number provided to the lab.

## 2021-02-15 NOTE — Progress Notes (Addendum)
HD#8 Subjective:  Overnight Events: Foley catheter placed  Sleeping in bed comfortably.  Does have periodic twitching of right hand and right side of mouth.  No other acute events.  Objective:  Vital signs in last 24 hours: Vitals:   02/15/21 1213 02/15/21 1242 02/15/21 1258 02/15/21 1320  BP: 117/74 117/77 127/86 127/88  Pulse: 90 84 97 93  Resp:  (!) 22 (!) 21   Temp: 99.6 F (37.6 C) 99.8 F (37.7 C) 99.3 F (37.4 C) 99.1 F (37.3 C)  TempSrc: Axillary Axillary Axillary Axillary  SpO2: 99% 99% 100% 100%  Weight:       Supplemental O2: Room Air SpO2: 100 %  Physical Exam:  Physical Exam Vitals and nursing note reviewed.  Constitutional:      Comments: Sleeping. She was laying in bed with eyes closed, not responding to verbal prompting.   HENT:     Head: Normocephalic and atraumatic.  Cardiovascular:     Rate and Rhythm: Normal rate and regular rhythm.     Heart sounds: Normal heart sounds.  Pulmonary:     Effort: Pulmonary effort is normal.  Abdominal:     Palpations: Abdomen is soft.  Musculoskeletal:     Right lower leg: No edema.     Left lower leg: No edema.  Skin:    General: Skin is warm and dry.     Findings: Rash present.     Comments: Hyperpigmented rash still present on b/l thighs and down left shin, unchanged   Neurological:     Mental Status: She is disoriented.     Filed Weights   02/12/21 1409  Weight: 58.9 kg     Intake/Output Summary (Last 24 hours) at 02/15/2021 1541 Last data filed at 02/15/2021 0700 Gross per 24 hour  Intake 2512.78 ml  Output 1425 ml  Net 1087.78 ml   Net IO Since Admission: 2,452.73 mL [02/15/21 1541]  Pertinent Labs: CBC Latest Ref Rng & Units 02/15/2021 02/14/2021 02/13/2021  WBC 4.0 - 10.5 K/uL 7.4 7.3 5.8  Hemoglobin 12.0 - 15.0 g/dL 10.8(L) 11.3(L) 10.9(L)  Hematocrit 36.0 - 46.0 % 33.9(L) 34.7(L) 33.6(L)  Platelets 150 - 400 K/uL 270 295 290    CMP Latest Ref Rng & Units 02/15/2021 02/14/2021  02/13/2021  Glucose 70 - 99 mg/dL 121(H) 189(H) 149(H)  BUN 6 - 20 mg/dL 21(H) 19 24(H)  Creatinine 0.44 - 1.00 mg/dL 0.63 0.67 0.73  Sodium 135 - 145 mmol/L 134(L) 135 137  Potassium 3.5 - 5.1 mmol/L 3.7 3.6 3.8  Chloride 98 - 111 mmol/L 104 103 105  CO2 22 - 32 mmol/L 24 26 26   Calcium 8.9 - 10.3 mg/dL 8.4(L) 8.6(L) 8.9  Total Protein 6.5 - 8.1 g/dL 7.1 6.8 6.3(L)  Total Bilirubin 0.3 - 1.2 mg/dL 0.4 0.8 0.6  Alkaline Phos 38 - 126 U/L 30(L) 33(L) 29(L)  AST 15 - 41 U/L 14(L) 18 16  ALT 0 - 44 U/L 13 13 13     Imaging: No results found.  Assessment/Plan:   Principal Problem:   Encephalitis Active Problems:   Primary hypothyroidism   Catatonia associated with another mental disorder   Patient Summary: Jamie [name] is a 20 y.o. with a pertinent PMH of Hashimoto thyroiditis, who presented with confusion and admitted for encephalopathy.   Encephalopathy IVIG day 3 (start 10/22). Completed 5/5 days IV methylprednisolone (10/22). Ativan trial for catatonia ineffective and discontinued.  Suspicious for autoimmune encephalitis. MRI Head with and without contrast negative for  any acute pathology. EEG with diffuse encephalopathy and ictal-interictal continuum low potential for seizures. Repeat overnight EEG, pending interpretations. Qualitative HSV, VZV, EBV, CMV in CSF negative. RPR negative. Cryptococcal antigen negative. Anti-NMDA Ig positive, will R/O ovarian teratoma with transvaginal ultrasound. Per radiology, abdominal ultrasound will most likely not be helpful the ovaries may not be visualized with transabdominal ultrasound. QuantiFERON gold pending.  Neurology also adding autoantibody panel.   Pending ANA Pending meningitis/encephalitis panel PCR Pending MTBE RIF Overnight EEG with video pending, per neurology -Greatly appreciate neurology and ID's assistance -IVIG day 3/5, IV methylprednisolone day 5/5 (10/22) -Autoantibody panel sent -Pastoral consult  placed  Urinary Retention While patient periodically voided large amounts on her own, 2+ bladder scans with over 200cc's of urine. Foley catheter placed overnight.  -Foley placed day 1 (start 10/23)  Hyperpigmented Macular Rash Patient with hyperpigmented rash on lower extremities, plan to refer to dermatology for further evaluation. Rash present prior to initiation of steroid. Unclear etiology at this time.  Per dermatology, not likely related to current encephalitis. Monitor.  Hypothyroidism 2/2 Hashimoto Disease Continue levothyroxine 100 mcg daily per tube  Malnutrition Secondary to encephalopathy and lack of p.o. intake.  Core track tube placed 02/12/21.  Magnesium and phosphate stable.  Continue tube feeds and to trend electrolytes. -Appreciate dietitian assistance recommendation. -thiamine daily -free water and feeding supplement daily -Feeding supplement per dietitian   Diet: feeding supplement per RD VTE: Place and maintain sequential compression device Start: 02/08/21 0847  Code: Full Code  PT/OT recs:  Pending Family Update: Family at bedside  Dispo: Anticipated discharge pending further workup  Merrily Brittle, DO Psychiatry Resident, PGY-1 [facility] Pager: [phone]  Please contact the on call pager after 5 pm and on weekends at [phone].

## 2021-02-15 NOTE — Progress Notes (Addendum)
This evening, patient was seen to have a blood nose likely irritation and pulling on the NG Tube (CorTrack). Bleeding was stopped with pressure, tilting head forward, and ice pack.  CorTrak (NG tube) was removed (10/24). She had already completed tube feeds for the day. We will likely restart it in 48 hours, tube prevent re-irritating the area.  Discussed with patient's mom about transvaginal ultrasound versus abdominal ultrasound to rule in/out possible ovarian teratoma as the next step in workup. After a positive anti-NMDA test this AM(10/24). Also discussed the benefits and limitation of both ultrasounds.  Mom states that she understood the procedures and gave consent to the trans-vaginal ultrasound.   Patient was seen with Dr. Riesa Pope.   Merrily Brittle, DO Psychiatry Resident, PGY-1 [facility] Pager: [phone]

## 2021-02-16 ENCOUNTER — Inpatient Hospital Stay (HOSPITAL_COMMUNITY): Payer: Medicaid Other

## 2021-02-16 DIAGNOSIS — R4182 Altered mental status, unspecified: Secondary | ICD-10-CM | POA: Diagnosis not present

## 2021-02-16 DIAGNOSIS — R569 Unspecified convulsions: Secondary | ICD-10-CM

## 2021-02-16 DIAGNOSIS — F061 Catatonic disorder due to known physiological condition: Secondary | ICD-10-CM | POA: Diagnosis not present

## 2021-02-16 LAB — CBC WITH DIFFERENTIAL/PLATELET
Abs Immature Granulocytes: 0.07 10*3/uL (ref 0.00–0.07)
Basophils Absolute: 0 10*3/uL (ref 0.0–0.1)
Basophils Relative: 0 %
Eosinophils Absolute: 0 10*3/uL (ref 0.0–0.5)
Eosinophils Relative: 0 %
HCT: 33 % — ABNORMAL LOW (ref 36.0–46.0)
Hemoglobin: 10.8 g/dL — ABNORMAL LOW (ref 12.0–15.0)
Immature Granulocytes: 1 %
Lymphocytes Relative: 10 %
Lymphs Abs: 1 10*3/uL (ref 0.7–4.0)
MCH: 30.2 pg (ref 26.0–34.0)
MCHC: 32.7 g/dL (ref 30.0–36.0)
MCV: 92.2 fL (ref 80.0–100.0)
Monocytes Absolute: 0.7 10*3/uL (ref 0.1–1.0)
Monocytes Relative: 7 %
Neutro Abs: 8 10*3/uL — ABNORMAL HIGH (ref 1.7–7.7)
Neutrophils Relative %: 82 %
Platelets: 246 10*3/uL (ref 150–400)
RBC: 3.58 MIL/uL — ABNORMAL LOW (ref 3.87–5.11)
RDW: 12.6 % (ref 11.5–15.5)
WBC: 9.8 10*3/uL (ref 4.0–10.5)
nRBC: 0 % (ref 0.0–0.2)

## 2021-02-16 LAB — ENA+DNA/DS+ANTICH+CENTRO+JO...
Anti JO-1: <0.2 AI (ref 0.0–0.9)
Centromere Ab Screen: <0.2 AI (ref 0.0–0.9)
Chromatin Ab SerPl-aCnc: 0.3 AI (ref 0.0–0.9)
ENA SM Ab Ser-aCnc: <0.2 AI (ref 0.0–0.9)
Ribonucleic Protein: 0.6 AI (ref 0.0–0.9)
SSA (Ro) (ENA) Antibody, IgG: 1.7 AI — ABNORMAL HIGH (ref 0.0–0.9)
SSB (La) (ENA) Antibody, IgG: 0.2 AI (ref 0.0–0.9)
Scleroderma (Scl-70) (ENA) Antibody, IgG: 0.2 AI (ref 0.0–0.9)
ds DNA Ab: 3 IU/mL (ref 0–9)

## 2021-02-16 LAB — BASIC METABOLIC PANEL
Anion gap: 5 (ref 5–15)
BUN: 15 mg/dL (ref 6–20)
CO2: 27 mmol/L (ref 22–32)
Calcium: 8.7 mg/dL — ABNORMAL LOW (ref 8.9–10.3)
Chloride: 102 mmol/L (ref 98–111)
Creatinine, Ser: 0.57 mg/dL (ref 0.44–1.00)
GFR, Estimated: >60 mL/min (ref >60)
Glucose, Bld: 100 mg/dL — ABNORMAL HIGH (ref 70–99)
Potassium: 3.8 mmol/L (ref 3.5–5.1)
Sodium: 134 mmol/L — ABNORMAL LOW (ref 135–145)

## 2021-02-16 LAB — CK: Total CK: 169 U/L (ref 38–234)

## 2021-02-16 LAB — ANA W/REFLEX IF POSITIVE: Anti Nuclear Antibody (ANA): POSITIVE — AB

## 2021-02-16 LAB — COMPREHENSIVE METABOLIC PANEL
ALT: 13 U/L (ref 0–44)
AST: 19 U/L (ref 15–41)
Albumin: 2.7 g/dL — ABNORMAL LOW (ref 3.5–5.0)
Alkaline Phosphatase: 28 U/L — ABNORMAL LOW (ref 38–126)
Anion gap: 8 (ref 5–15)
BUN: 18 mg/dL (ref 6–20)
CO2: 24 mmol/L (ref 22–32)
Calcium: 8.4 mg/dL — ABNORMAL LOW (ref 8.9–10.3)
Chloride: 96 mmol/L — ABNORMAL LOW (ref 98–111)
Creatinine, Ser: 0.72 mg/dL (ref 0.44–1.00)
GFR, Estimated: >60 mL/min (ref >60)
Glucose, Bld: 99 mg/dL (ref 70–99)
Potassium: 4.1 mmol/L (ref 3.5–5.1)
Sodium: 128 mmol/L — ABNORMAL LOW (ref 135–145)
Total Bilirubin: 0.8 mg/dL (ref 0.3–1.2)
Total Protein: 7.4 g/dL (ref 6.5–8.1)

## 2021-02-16 LAB — PHOSPHORUS: Phosphorus: 2.1 mg/dL — ABNORMAL LOW (ref 2.5–4.6)

## 2021-02-16 LAB — GLUCOSE, CAPILLARY
Glucose-Capillary: 103 mg/dL — ABNORMAL HIGH (ref 70–99)
Glucose-Capillary: 90 mg/dL (ref 70–99)
Glucose-Capillary: 90 mg/dL (ref 70–99)
Glucose-Capillary: 93 mg/dL (ref 70–99)
Glucose-Capillary: 94 mg/dL (ref 70–99)
Glucose-Capillary: 95 mg/dL (ref 70–99)

## 2021-02-16 LAB — HEPATITIS C ANTIBODY: HCV Ab: NONREACTIVE

## 2021-02-16 LAB — SODIUM, URINE, RANDOM: Sodium, Ur: 26 mmol/L

## 2021-02-16 LAB — MAGNESIUM: Magnesium: 1.8 mg/dL (ref 1.7–2.4)

## 2021-02-16 LAB — HEPATITIS B CORE ANTIBODY, TOTAL: Hep B Core Total Ab: REACTIVE — AB

## 2021-02-16 LAB — OSMOLALITY: Osmolality: 285 mOsm/kg (ref 275–295)

## 2021-02-16 LAB — HEPATITIS B SURFACE ANTIGEN: Hepatitis B Surface Ag: NONREACTIVE

## 2021-02-16 LAB — OSMOLALITY, URINE: Osmolality, Ur: 109 mOsm/kg — ABNORMAL LOW (ref 300–900)

## 2021-02-16 MED ORDER — LEVETIRACETAM IN NACL 500 MG/100ML IV SOLN: 500.0000 mg | Freq: Two times a day (BID) | INTRAVENOUS | Status: DC

## 2021-02-16 MED ORDER — LORAZEPAM 2 MG/ML IJ SOLN
4.0000 mg | INTRAMUSCULAR | Status: DC | PRN
  Administered 2021-02-16: 4 mg via INTRAVENOUS
  Filled 2021-02-16: qty 2

## 2021-02-16 MED ORDER — ORAL CARE MOUTH RINSE
15.0000 mL | OROMUCOSAL | Status: DC
  Administered 2021-02-16 – 2021-02-17 (×13): 15 mL via OROMUCOSAL

## 2021-02-16 MED ORDER — LACTATED RINGERS IV BOLUS
1000.0000 mL | Freq: Once | INTRAVENOUS | Status: AC
  Administered 2021-02-16: 1000 mL via INTRAVENOUS

## 2021-02-16 MED ORDER — LEVETIRACETAM IN NACL 1000 MG/100ML IV SOLN
1000.0000 mg | Freq: Two times a day (BID) | INTRAVENOUS | Status: DC
  Administered 2021-02-16 – 2021-02-19 (×7): 1000 mg via INTRAVENOUS
  Filled 2021-02-16 (×7): qty 100

## 2021-02-16 MED ORDER — LEVETIRACETAM IN NACL 1500 MG/100ML IV SOLN
1500.0000 mg | Freq: Once | INTRAVENOUS | Status: AC
  Administered 2021-02-16: 1500 mg via INTRAVENOUS
  Filled 2021-02-16: qty 100

## 2021-02-16 MED ORDER — CHLORHEXIDINE GLUCONATE 0.12% ORAL RINSE (MEDLINE KIT)
15.0000 mL | Freq: Two times a day (BID) | OROMUCOSAL | Status: DC
  Administered 2021-02-16 – 2021-02-19 (×7): 15 mL via OROMUCOSAL

## 2021-02-16 MED ORDER — LORAZEPAM 2 MG/ML IJ SOLN: 1.0000 mg | INTRAMUSCULAR | Status: DC | PRN

## 2021-02-16 NOTE — Progress Notes (Signed)
RT notified by lab that ABG specimen clotted.  Dr. Tamala Julian made aware, verbal order received to obtain VBG at this time. RT will continue to monitor and be available as needed.

## 2021-02-16 NOTE — Consult Note (Signed)
[facility] Face-to-Face Psychiatry Consult   Reason for Consult:  Concern for catatonia  Referring Physician:  Gerlene Fee, MD Patient Identification: Quanasia Defino MRN:  [id number] Principal Diagnosis: Encephalitis Diagnosis:  Principal Problem:   Encephalitis Active Problems:   Primary hypothyroidism   Catatonia associated with another mental disorder Assessment  Theadora Noyes is a 20 y.o. female admitted medically for acute altered mental status with mutism with recent history of nausea and vomiting.  02/06/2021  2:17 PM patient carries no known past psychiatric history.  Patient carries known past medical history of Hashimoto's thyroiditis.  Psychiatry was consulted for acute change in altered mental status with concern for catatonia.  Patient was prescribed Synthroid outpatient; however, per EMR patient's mother endorsed patient had not been compliant.  On initial examination, patient is only able to answer very few questions with words and mostly responds with a high-pitched "hmm" as if she does not know what was asked. We plan to do an Ativan trial for possible catatonia as well as start low-dose antipsychotics to help with severe agitation that has been noted during hospitalization.   Patient had a family friend in room who endorsed the patient's mental status appears to wax and wane.  Increasing concern for delirium related to medical illness.  However, patient was able to endorse being in pain in her upper extremities.  We will attempt chemical restraints to help decrease frequency of severe agitation outbursts leading to physical restraints.   10/18: Seen with resident who wrote original consult note; agree with assessement and plan. Deferred ativan trial (pt was supposed to get ativan prior to MRI, was going to have nurse message to evaluate before/after does not appear to have gotten it). 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 Will likely do formal ativan challenge tomorrow if pt still unable to take PO - although agree with neurology that tx of underlying medical cause at this time is of primary importance, pts with catatonia 2/2 encephalitis can still often have some degree of response to standard treatments for catatonia such as lorazepam.    10/19: Patient appears less interactive today and did not appear to get agitated during visit. Patient had family at bedside who endorsed that patient's presentation continues to wax and wane. Overnight patient required assessment by primary team who noted increased rigidity in patient and patient was noted to have increased rigidity in her BLE and mild rigidity in her biceps. Patient scored a 10 on the Bush- Francis scale.  This is again concerning for catatonia. Patient is requiring physical restraints due episodes of hyperdelirium and there is concern that continued frequent use of these in conjunction with muscle rigidity could lead to muscle breakdown. It remains possible that patient's catatonic like presentation is 2/2 to an autoimmune encephalitis; however it may be beneficial to treat both underlying cause and symptomatic management of of more emergent catatonia symptoms to decrease chances of further decompensation.    10/21: Patient very difficult to arouse this AM. Attempted to see patient again in the PM. Patient was more responsive in the PM. Patient appeared to struggle to open her eyes, but appeared to attempt to interact to commands and let out a whimper. Patient had a Coretrack place as she has not PO intake in at least 2 days. BFCRS prior to Ativan 14. Post- Ativan:  Patient opened her eyes wider and able to maintain and blink, patient attempting to lift head and able to move both arms and squeeze both hands on command. Attempted to  communicate with patient via blinking; however this was inconsistent. Patient was also able to move L foot on command. Patient had decreased rigidity in BLE. Patient's positive response to Ativan trial remains convincing that patient is currently catatonic and this may be 2/2 encephalitis.    10/22: Patient sitting in bed this morning with mitt on left hand. Neurology and IM at bedside as well.Slow spontaneous upper extremity movements noted.  . Patient unable to follow commands this morning, not able to  squeeze hands or open eyes when asked. No documentation how how patient responded to ativan overnight. Spoke with nurse and requested documentation of response to ativan and to pass along to night team as this will provide helpful clinical information.   10/24: On assessment today patient appears worse.  Patient's lower extremities appear to be more rigid than during Ativan trial.  At this time we do recommend the Ativan be reconsidered post EEG.  Per EMR there appeared to be concern for oversedation over the weekend however, EMR also notes that patient was able to move her limbs with more intention.  On assessment today patient is not moving any of her limbs with intention and appears to have stereotypical movements.  Although formal both Thelma Barge catatonia scale was not done today, today was the first time where stereotypical movement were noted on exam and patient's rigidity has markedly increased.  Patient also was not able to follow any commands on assessment today.  If Ativan is restarted to help decrease lower extremity rigidity and risk for contractures could consider restarting at a lower dosage.  10/25 On assessment today patient remains connected to LTM EEG.  Overnight patient was noted to have seizure-like episode and received Ativan 4 mg.  Noticeably patient had less rigidity in her BLE on exam today.  It is recommended to consider Ativan as part of his overall care plan as patient appears to  respond well in terms of decreased rigidity when receiving Ativan.    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  Titers noted >1: 2560. -Consider rheumatology consult - Recommend- Agitation protocol- Zyprexa 2.5mg  PRN (DO NOT GIVE within 30 min of Ativan due to risk of resp depression)  - EKG 10/18 Qtc 420 - CT Chest/ Abdomen/ Pelvis: negative - MRI of head: No intracranial abnormalities noted, neurology concern for posterior colliculi subtle enhancement   Thank you for this consult.  Psychiatry will continue to follow from afar.   Total Time spent with patient: 15 minutes Subjective:   Maiyah Perritt is a a 20 y.o. female,  [place] female patient admitted with acute altered mental status and recent history of nausea and vomiting.  Patient has no known psychiatric history but does have a history of Hashimoto's thyroiditis.   HPI: On assessment today patient remains not alert but did respond to sternal rub.    Past Medical History:  Past Medical History:  Diagnosis Date  . Acquired autoimmune hypothyroidism    Dx 12/2014, TSH 110, FT4 0.3   History reviewed. No pertinent surgical history. Family History:  Family History  Problem Relation Age of Onset  . Healthy Mother   . Healthy Father     Social History:  Social History  Substance and Sexual Activity  Alcohol Use Not Currently     Social History   Substance and Sexual Activity  Drug Use Not Currently    Social History   Socioeconomic History  . Marital status: Single    Spouse name: Not on file  . Number of children: Not on file  . Years of education: Not on file  . Highest education level: Not on file  Occupational History  . Not on file  Tobacco Use  . Smoking status: Never  . Smokeless tobacco: Never  Substance and Sexual Activity  .  Alcohol use: Not Currently  . Drug use: Not Currently  . Sexual activity: Not on file  Other Topics Concern  . Not on file  Social History Narrative   Lives at home with mom and maternal grandmother and two siblings attends [employer] school is in the 8th grade.    Social Determinants of Health   Financial Resource Strain: Not on file  Food Insecurity: Not on file  Transportation Needs: Not on file  Physical Activity: Not on file  Stress: Not on file  Social Connections: Not on file   Additional Social History:    Allergies:  No Known Allergies  Labs:  Results for orders placed or performed during the hospital encounter of 02/06/21 (from the past 48 hour(s))  Glucose, capillary     Status: Abnormal   Collection Time: 02/14/21  4:25 PM  Result Value Ref Range   Glucose-Capillary 134 (H) 70 - 99 mg/dL    Comment: Glucose reference range applies only to samples taken after fasting for at least 8 hours.  Glucose, capillary     Status: Abnormal   Collection Time: 02/14/21  7:33 PM  Result Value Ref Range   Glucose-Capillary 126 (H) 70 - 99 mg/dL    Comment: Glucose reference range applies only to samples taken after fasting for at least 8 hours.  Glucose, capillary     Status: None   Collection Time: 02/14/21 11:55 PM  Result Value Ref Range   Glucose-Capillary 96 70 - 99 mg/dL    Comment: Glucose reference range applies only to samples taken after fasting for at least 8 hours.  Glucose, capillary     Status: Abnormal   Collection Time: 02/15/21  3:53 AM  Result Value Ref Range   Glucose-Capillary 120 (H) 70 - 99 mg/dL    Comment: Glucose reference range applies only to samples taken after fasting for at least 8 hours.  CBC with Differential/Platelet     Status: Abnormal   Collection Time: 02/15/21  3:54 AM  Result Value Ref Range   WBC 7.4 4.0 - 10.5 K/uL   RBC 3.59 (L) 3.87 - 5.11 MIL/uL   Hemoglobin 10.8 (L) 12.0 - 15.0 g/dL   HCT 33.9 (L) 36.0 - 46.0 %   MCV  94.4 80.0 - 100.0 fL   MCH 30.1 26.0 - 34.0 pg   MCHC 31.9 30.0 - 36.0 g/dL   RDW 12.5 11.5 - 15.5 %   Platelets 270 150 - 400 K/uL   nRBC 0.0 0.0 - 0.2 %   Neutrophils Relative % 76 %   Neutro Abs 5.7 1.7 - 7.7 K/uL   Lymphocytes Relative 14 %   Lymphs Abs 1.0 0.7 - 4.0 K/uL   Monocytes Relative 9 %   Monocytes Absolute 0.7 0.1 - 1.0 K/uL   Eosinophils Relative 0 %   Eosinophils Absolute 0.0 0.0 - 0.5 K/uL   Basophils Relative  0 %   Basophils Absolute 0.0 0.0 - 0.1 K/uL   Immature Granulocytes 1 %   Abs Immature Granulocytes 0.08 (H) 0.00 - 0.07 K/uL    Comment: Performed at [facility], [place] [address], [place], [place] [address]  Comprehensive metabolic panel     Status: Abnormal   Collection Time: 02/15/21  3:54 AM  Result Value Ref Range   Sodium 134 (L) 135 - 145 mmol/L   Potassium 3.7 3.5 - 5.1 mmol/L   Chloride 104 98 - 111 mmol/L   CO2 24 22 - 32 mmol/L   Glucose, Bld 121 (H) 70 - 99 mg/dL    Comment: Glucose reference range applies only to samples taken after fasting for at least 8 hours.   BUN 21 (H) 6 - 20 mg/dL   Creatinine, Ser 0.63 0.44 - 1.00 mg/dL   Calcium 8.4 (L) 8.9 - 10.3 mg/dL   Total Protein 7.1 6.5 - 8.1 g/dL   Albumin 2.6 (L) 3.5 - 5.0 g/dL   AST 14 (L) 15 - 41 U/L   ALT 13 0 - 44 U/L   Alkaline Phosphatase 30 (L) 38 - 126 U/L   Total Bilirubin 0.4 0.3 - 1.2 mg/dL   GFR, Estimated >60 >60 mL/min    Comment: (NOTE) Calculated using the CKD-EPI Creatinine Equation (2021)    Anion gap 6 5 - 15    Comment: Performed at [facility], [place] [address] [place], [place] [address]  Magnesium     Status: None   Collection Time: 02/15/21  3:54 AM  Result Value Ref Range   Magnesium 2.1 1.7 - 2.4 mg/dL    Comment: Performed at [facility] [address] [place], [place] [address]  Phosphorus     Status: Abnormal   Collection Time: 02/15/21  3:54 AM  Result Value Ref Range   Phosphorus 1.9 (L) 2.5 - 4.6 mg/dL    Comment: Performed at  [facility] [address], [place], [place] [address]  Glucose, capillary     Status: Abnormal   Collection Time: 02/15/21  8:17 AM  Result Value Ref Range   Glucose-Capillary 120 (H) 70 - 99 mg/dL    Comment: Glucose reference range applies only to samples taken after fasting for at least 8 hours.   Comment 1 Notify RN    Comment 2 Document in Chart   Glucose, capillary     Status: Abnormal   Collection Time: 02/15/21 12:47 PM  Result Value Ref Range   Glucose-Capillary 121 (H) 70 - 99 mg/dL    Comment: Glucose reference range applies only to samples taken after fasting for at least 8 hours.   Comment 1 Notify RN    Comment 2 Document in Chart   Glucose, capillary     Status: Abnormal   Collection Time: 02/15/21  4:24 PM  Result Value Ref Range   Glucose-Capillary 134 (H) 70 - 99 mg/dL    Comment: Glucose reference range applies only to samples taken after fasting for at least 8 hours.   Comment 1 Notify RN    Comment 2 Document in Chart   Glucose, capillary     Status: None   Collection Time: 02/15/21  8:20 PM  Result Value Ref Range   Glucose-Capillary 99 70 - 99 mg/dL    Comment: Glucose reference range applies only to samples taken after fasting for at least 8 hours.   Comment 1 Notify RN  Comment 2 Document in Chart   Glucose, capillary     Status: None   Collection Time: 02/15/21 11:15 PM  Result Value Ref Range   Glucose-Capillary 97 70 - 99 mg/dL    Comment: Glucose reference range applies only to samples taken after fasting for at least 8 hours.   Comment 1 Notify RN    Comment 2 Document in Chart   CBC with Differential/Platelet     Status: Abnormal   Collection Time: 02/16/21  2:17 AM  Result Value Ref Range   WBC 9.8 4.0 - 10.5 K/uL   RBC 3.58 (L) 3.87 - 5.11 MIL/uL   Hemoglobin 10.8 (L) 12.0 - 15.0 g/dL   HCT 33.0 (L) 36.0 - 46.0 %   MCV 92.2 80.0 - 100.0 fL   MCH 30.2 26.0 - 34.0 pg   MCHC 32.7 30.0 - 36.0 g/dL   RDW 12.6 11.5 - 15.5 %    Platelets 246 150 - 400 K/uL   nRBC 0.0 0.0 - 0.2 %   Neutrophils Relative % 82 %   Neutro Abs 8.0 (H) 1.7 - 7.7 K/uL   Lymphocytes Relative 10 %   Lymphs Abs 1.0 0.7 - 4.0 K/uL   Monocytes Relative 7 %   Monocytes Absolute 0.7 0.1 - 1.0 K/uL   Eosinophils Relative 0 %   Eosinophils Absolute 0.0 0.0 - 0.5 K/uL   Basophils Relative 0 %   Basophils Absolute 0.0 0.0 - 0.1 K/uL   Immature Granulocytes 1 %   Abs Immature Granulocytes 0.07 0.00 - 0.07 K/uL    Comment: Performed at [facility], [address], [place], [place] [address]  Comprehensive metabolic panel     Status: Abnormal   Collection Time: 02/16/21  2:17 AM  Result Value Ref Range   Sodium 128 (L) 135 - 145 mmol/L   Potassium 4.1 3.5 - 5.1 mmol/L   Chloride 96 (L) 98 - 111 mmol/L   CO2 24 22 - 32 mmol/L   Glucose, Bld 99 70 - 99 mg/dL    Comment: Glucose reference range applies only to samples taken after fasting for at least 8 hours.   BUN 18 6 - 20 mg/dL   Creatinine, Ser 0.72 0.44 - 1.00 mg/dL   Calcium 8.4 (L) 8.9 - 10.3 mg/dL   Total Protein 7.4 6.5 - 8.1 g/dL   Albumin 2.7 (L) 3.5 - 5.0 g/dL   AST 19 15 - 41 U/L   ALT 13 0 - 44 U/L   Alkaline Phosphatase 28 (L) 38 - 126 U/L   Total Bilirubin 0.8 0.3 - 1.2 mg/dL   GFR, Estimated >60 >60 mL/min    Comment: (NOTE) Calculated using the CKD-EPI Creatinine Equation (2021)    Anion gap 8 5 - 15    Comment: Performed at [facility], [place] [address], [place], [place] [address]  Magnesium     Status: None   Collection Time: 02/16/21  2:17 AM  Result Value Ref Range   Magnesium 1.8 1.7 - 2.4 mg/dL    Comment: Performed at [facility] [address], [place], [place] [address]  Phosphorus     Status: Abnormal   Collection Time: 02/16/21  2:17 AM  Result Value Ref Range   Phosphorus 2.1 (L) 2.5 - 4.6 mg/dL    Comment: Performed at [facility] [address] [place], [place] [address]  CK     Status: None   Collection Time:  02/16/21   2:17 AM  Result Value Ref Range   Total CK 169 38 - 234 U/L    Comment: Performed at [facility], [place] [address] [place], [place] [address]  Glucose, capillary     Status: None   Collection Time: 02/16/21  3:41 AM  Result Value Ref Range   Glucose-Capillary 90 70 - 99 mg/dL    Comment: Glucose reference range applies only to samples taken after fasting for at least 8 hours.   Comment 1 Notify RN    Comment 2 Document in Chart   Osmolality     Status: None   Collection Time: 02/16/21  5:04 AM  Result Value Ref Range   Osmolality 285 275 - 295 mOsm/kg    Comment: Performed at [facility], [place] [address], [place], [place] [address]  .Hepatitis B Surface Antigen     Status: None   Collection Time: 02/16/21  7:49 AM  Result Value Ref Range   Hepatitis B Surface Ag NON REACTIVE NON REACTIVE    Comment: Performed at [facility] [address] [place], [place] [address]  Hepatitis B core antibody, total     Status: Abnormal   Collection Time: 02/16/21  7:49 AM  Result Value Ref Range   Hep B Core Total Ab Reactive (A) NON REACTIVE    Comment: Performed at [facility] [address], [place], [place] [address]  Hepatitis C antibody     Status: None   Collection Time: 02/16/21  7:49 AM  Result Value Ref Range   HCV Ab NON REACTIVE NON REACTIVE    Comment: (NOTE) Nonreactive HCV antibody screen is consistent with no HCV infections,  unless recent infection is suspected or other evidence exists to indicate HCV infection.  Performed at [facility], [place] [address], [place], [place] [address]   Sodium, urine, random     Status: None   Collection Time: 02/16/21  8:00 AM  Result Value Ref Range   Sodium, Ur 26 mmol/L    Comment: Performed at [facility] [address] [place], [place] [address]  Osmolality, urine     Status: Abnormal   Collection Time: 02/16/21  8:00 AM  Result Value Ref Range   Osmolality, Ur 109 (L) 300 - 900 mOsm/kg     Comment: Performed at [facility], [place] [address], [place], [place] [address]  Glucose, capillary     Status: None   Collection Time: 02/16/21  8:32 AM  Result Value Ref Range   Glucose-Capillary 93 70 - 99 mg/dL    Comment: Glucose reference range applies only to samples taken after fasting for at least 8 hours.  Glucose, capillary     Status: None   Collection Time: 02/16/21 12:30 PM  Result Value Ref Range   Glucose-Capillary 94 70 - 99 mg/dL    Comment: Glucose reference range applies only to samples taken after fasting for at least 8 hours.    Current Facility-Administered Medications  Medication Dose Route Frequency Provider Last Rate Last Admin  . acetaminophen (TYLENOL) tablet 650 mg  650 mg Oral Q6H PRN Maudie Mercury, MD   650 mg at 02/14/21 0541   Or  . acetaminophen (TYLENOL) suppository 650 mg  650 mg Rectal Q6H PRN Maudie Mercury, MD      . chlorhexidine gluconate (MEDLINE KIT) (PERIDEX) 0.12 % solution 15 mL  15 mL Mouth Rinse BID Sanjuan Dame,  MD   15 mL at 02/16/21 0828  . Chlorhexidine Gluconate Cloth 2 % PADS 6 each  6 each Topical Daily Aldine Contes, MD   6 each at 02/16/21 1159  . feeding supplement (OSMOLITE 1.2 CAL) liquid 1,000 mL  1,000 mL Per Tube Continuous Axel Filler, MD   Stopped at 02/14/21 [phone]  . Immune Globulin 10% (PRIVIGEN) IV infusion 25 g  400 mg/kg Intravenous Q24 Hr x 5 Kerney Elbe, MD 0 mL/hr at 02/14/21 1828 25 g at 02/15/21 1216  . lactated ringers infusion   Intravenous Continuous Riesa Pope, MD 50 mL/hr at 02/15/21 2113 New Bag at 02/15/21 2113  . levETIRAcetam (KEPPRA) IVPB 1000 mg/100 mL premix  1,000 mg Intravenous Renato Gails, MD 400 mL/hr at 02/16/21 0944 1,000 mg at 02/16/21 0944  . levothyroxine (SYNTHROID, LEVOTHROID) injection 50 mcg  50 mcg Intravenous Daily Merrily Brittle, DO      . MEDLINE mouth rinse  15 mL Mouth Rinse 10 times per day Sanjuan Dame, MD   15 mL at 02/16/21 1159   . pantoprazole sodium (PROTONIX) 40 mg/20 mL oral suspension 40 mg  40 mg Per Tube QHS Karren Cobble, RPH   40 mg at 02/14/21 2225  . thiamine tablet 100 mg  100 mg Per Tube Daily Karren Cobble, [facility]        Psychiatric Specialty Exam:   Remain unable to assess due to patient's current state. Presentation  General Appearance: Bizarre  Eye Contact:None  Speech:Other (comment) (non verbal)  Speech Volume:-- (non verbal)  Handedness:-- (unable to assess)   Mood and Affect  Mood:-- (unable to assess)  Affect:Flat   Thought Process  Thought Processes:-- (unable to assess)  Descriptions of Associations:-- (unable to assess)  Orientation:-- (unable to assess)  Thought Content:-- (unable to assess)  History of Schizophrenia/Schizoaffective disorder:No data recorded Duration of Psychotic Symptoms:No data recorded Hallucinations:No data recorded Ideas of Reference:-- (unable to assess)  Suicidal Thoughts:No data recorded Homicidal Thoughts:No data recorded  Sensorium  Memory:-- (unable to assess)  Judgment:-- (unable to assess)  Insight:-- (unable to assess)   Executive Functions  Concentration:-- (unable to assess)  Attention Span:-- (unable to assess)  Recall:-- (unable to assess)  Fund of Knowledge:-- (unable to assess)  Language:-- (unable to assess)   Psychomotor Activity  Psychomotor Activity:No data recorded  Assets  Assets:Social Support   Sleep  Sleep:No data recorded  Physical Exam: Physical Exam Constitutional:      Comments: Patient response to sternal rub but does not awaken.   ROS Blood pressure 113/89, pulse 80, temperature (P) 99 F (37.2 C), temperature source (P) Axillary, resp. rate 19, weight 58.9 kg, last menstrual period 01/11/2021, SpO2 100 %. Body mass index is 23.73 kg/m.   PGY-2 Freida Busman, MD 02/16/2021 1:29 PM

## 2021-02-16 NOTE — Progress Notes (Signed)
CLINICAL UPDATE:  Paged by RN patient had seizure. Dr. Raymondo Band and I evaluated patient at bedside. Per RN and mother in the room patient had a few second episode of generalized, full body tonic-clonic seizure. This was witnessed by mother. The episode spontaneously resolved without intervention.  Currently, patient is afebrile, hemodynamically stable on room air. Neurologically somnolent, not responding to verbal commands. Bilateral pupils round and reactive to light. No current seizure-like activity.   Discussed with neurology, who is recommending 1500mg  Keppra load tonight followed by Keppra 500mg  twice daily. 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 Patient did have MRI brain 3 days ago, which was normal. Will hold off further neuro imaging at this time. She is also currently protecting her airway. Will continue to monitor closely and place seizure precautions.  - Keppra 1500mg  tonight followed by Keppra 500mg  twice daily - IV Ativan 4mg  every 69min PRN seizure - Seizure precautions placed - Continue long-term EEG monitoring - F/u CBC, BMP, CK   Sanjuan Dame, MD Internal Medicine PGY-2 [phone]

## 2021-02-16 NOTE — Consult Note (Signed)
NAME:  Jamie [name], MRN:  [id number], DOB:  [birth year], LOS: 9 ADMISSION DATE:  02/06/2021, CONSULTATION DATE:  02/16/21 REFERRING MD: Raymondo Band , CHIEF COMPLAINT:  Seizures   History of Present Illness:  Jamie [name] is a 20 y.o. F with PMH of hypothyroidism who was admitted 10/16 with encephalopathy of unclear etiology but possibly Hashimoto's thyroiditis.  She has had a complicated course and suspicious for auto-immune encephalitis and completed 5 days steroids and started IVIG 10/22.  Head imaging negative for acute pathology.  Plan to evaluate for ovarian teratoma.  She has been baseline fairly catatonic, but on 10/24 had new onset seizure activity twice.  Neurology following and patient is undergoing EEG, no further head imaging recommended.  Her first seizure was generalized, the second more focal eye and LE twitching.  Pertinent  Medical History    has a past medical history of Acquired autoimmune hypothyroidism.  Significant Hospital Events: Including procedures, antibiotic start and stop dates in addition to other pertinent events   10/16 Admit 10/25 New onset seizure activity, PCCM consult  Interim History / Subjective:  Pt was treated with Ativan and seizure activity resolved currently.  Hemodynamically stable and protecting her airway  Objective   Blood pressure 105/70, pulse 92, temperature 99.7 F (37.6 C), temperature source Axillary, resp. rate (!) 21, weight 58.9 kg, last menstrual period 01/11/2021, SpO2 99 %.        Intake/Output Summary (Last 24 hours) at 02/16/2021 0225 Last data filed at 02/16/2021 7124 Gross per 24 hour  Intake 3764.42 ml  Output 1575 ml  Net 2189.42 ml   Filed Weights   02/12/21 1409  Weight: 58.9 kg   General:  Thin F, resting in bed, eyes open, not responsive otherwise HEENT: MM pink/moist, pupils 80mm and responsive Neuro: laying in bed with hyperflexed neck, eyes open, spontaneously moving fingers and toes, pupils equal,  does not interact or follow commands, no current sz activity, no asterixis  CV: s1s2 rrr, no m/r/g PULM:  lungs clear bialterally GI: soft, bsx4 active  Extremities: warm/dry, no edema     Resolved Hospital Problem list     Assessment & Plan:   New onset seizure activity in the setting of ongoing AMS secondary to 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  She is currently stable so would recommend continued progressive care, if patient has another seizure, becomes unstable or is not protecting her airway will plan to transfer to ICU    Best Practice (right click and "Reselect all SmartList Selections" daily)   Per primary  Labs   CBC: Recent Labs  Lab 02/11/21 0926 02/12/21 0132 02/13/21 0157 02/14/21 0339 02/15/21 0354  WBC 12.0* 9.5 5.8 7.3 7.4  NEUTROABS 10.5* 8.4* 4.8 6.5 5.7  HGB 11.2* 11.3* 10.9* 11.3* 10.8*  HCT 34.9* 34.9* 33.6* 34.7* 33.9*  MCV 91.6 91.8 92.3 91.8 94.4  PLT 314 321 290 295 580    Basic Metabolic Panel: Recent Labs  Lab 02/11/21 0926 02/12/21 0132 02/13/21 0157 02/14/21 0339 02/15/21 0354  NA 140 139 137 135 134*  K 3.9 3.9 3.8 3.6 3.7  CL 106 107 105 103 104  CO2 25 25 26 26 24   GLUCOSE 129* 134* 149* 189* 121*  BUN 19 23* 24* 19 21*  CREATININE 0.76 0.80 0.73 0.67  0.63  CALCIUM 9.6 9.4 8.9 8.6* 8.4*  MG  --  2.6* 2.4 2.5* 2.1  PHOS  --   --  2.5 2.6 1.9*   GFR: Estimated Creatinine Clearance: 89.5 mL/min (by C-G formula based on SCr of 0.63 mg/dL). Recent Labs  Lab 02/12/21 0132 02/13/21 0157 02/14/21 0339 02/15/21 0354  WBC 9.5 5.8 7.3 7.4    Liver Function Tests: Recent Labs  Lab 02/10/21 0344 02/12/21 0132 02/13/21 0157 02/14/21 0339 02/15/21 0354  AST 16 21 16 18  14*  ALT 13  13 13 13 13   ALKPHOS 35* 32* 29* 33* 30*  BILITOT 0.5 0.8 0.6 0.8 0.4  PROT 7.4 6.7 6.3* 6.8 7.1  ALBUMIN 3.9 3.5 3.2* 3.0* 2.6*   No results for input(s): LIPASE, AMYLASE in the last 168 hours. No results for input(s): AMMONIA in the last 168 hours.  ABG No results found for: PHART, PCO2ART, PO2ART, HCO3, TCO2, ACIDBASEDEF, O2SAT   Coagulation Profile: No results for input(s): INR, PROTIME in the last 168 hours.  Cardiac Enzymes: No results for input(s): CKTOTAL, CKMB, CKMBINDEX, TROPONINI in the last 168 hours.  HbA1C: No results found for: HGBA1C  CBG: Recent Labs  Lab 02/15/21 0817 02/15/21 1247 02/15/21 1624 02/15/21 2020 02/15/21 2315  GLUCAP 120* 121* 134* 99 97    Review of Systems:   Unable to obtain secondary to mental status  Past Medical History:  She,  has a past medical history of Acquired autoimmune hypothyroidism.   Surgical History:  History reviewed. No pertinent surgical history.   Social History:   reports that she has never smoked. She has never used smokeless tobacco. She reports that she does not currently use alcohol. She reports that she does not currently use drugs.   Family History:  Her family history includes Healthy in her father and mother.   Allergies No Known Allergies   Home Medications  Prior to Admission medications   Medication Sig Start Date End Date Taking? Authorizing Provider  levothyroxine (SYNTHROID) 100 MCG tablet Take 1 tablet (100 mcg total) by mouth daily. 01/15/21  Yes Shamleffer, Melanie Crazier, MD     Critical care time: n/a     Otilio Carpen Asanti Craigo, PA-C [facility] See Amion for pager If no response to pager , please call [phone] until 7pm After 7:00 pm call Elink  [phone]?[place]

## 2021-02-16 NOTE — Progress Notes (Addendum)
HD 9 Subjective:  Overnight Events: Night team was paged for tachypnea 2/2 possible aspiration from pulling out her NG tube earlier that evening. She also had two episodes concerning for seizure. First episode with several seconds of generalized tonic-clonic activity witnessed by mom and RN, second episode with focalized eye and lower extremity twitching.  After the first episode, she received a loading dose of Keppra.  Before completing loading dose, she had second episode and given Ativan 4 mg. She was started on Keppra 1000 mg twice daily. PCCM consulted and evaluated patient.  Stated that currently she does not require ICU placement as she is stable and protecting her airway.   This morning patient was evaluated with mom at bedside. Patient was not alert. She did withdraw her forearm when pinched, otherwise unarousable. She was resting comfortably in bed, with head of the head of bed elevated.  Her breathing appeared unlabored and regular.   No gross involuntary movements, tremors, sialorrhea, any facial movements, or eye blinking noticed.   Of note, mom reports to neurologist that patient has a history of typhoid fever after she traveled to [place] last year.  Objective:  Vital signs in last 24 hours: Vitals:   02/16/21 0900 02/16/21 1000 02/16/21 1100 02/16/21 1151  BP:    113/89  Pulse: 92 87 82 80  Resp: 16 19 18 19   Temp:    (P) 99 F (37.2 C)  TempSrc:    (P) Axillary  SpO2: 100% 100% 100% 100%  Weight:       Supplemental O2:  SpO2: 100 % O2 Device: Nasal Cannula O2 Flow Rate (L/min): 2 L/min   Physical Exam:  Physical Exam Vitals and nursing note reviewed.  Constitutional:      General: She is not in acute distress.    Appearance: She is not diaphoretic.     Comments: Somnolent, responsive to pain  HENT:     Head: Normocephalic and atraumatic.     Mouth/Throat:     Comments: No sialorrhea Eyes:     Comments: Pupillary reflex intact  Cardiovascular:     Rate and  Rhythm: Normal rate and regular rhythm.     Heart sounds: Normal heart sounds.  Pulmonary:     Effort: Pulmonary effort is normal.     Comments: On 2 L nasal cannula Musculoskeletal:     Right lower leg: No edema.     Left lower leg: No edema.  Skin:    General: Skin is warm and dry.     Findings: Rash present.     Comments: Hyperpigmented rash still present on b/l thighs and down left shin, unchanged   Neurological:     Comments: Increased resistance to left lower extremity knee flexion noted, as compared to the right lower extremity.    Filed Weights   02/12/21 1409  Weight: 58.9 kg    Intake/Output Summary (Last 24 hours) at 02/16/2021 1353 Last data filed at 02/16/2021 0639 Gross per 24 hour  Intake 1825.1 ml  Output 2400 ml  Net -574.9 ml   Net IO Since Admission: 1,877.83 mL [02/16/21 1353]  Pertinent Labs: Results for orders placed or performed during the hospital encounter of 02/06/21 (from the past 24 hour(s))  Glucose, capillary     Status: Abnormal   Collection Time: 02/15/21  4:24 PM  Result Value Ref Range   Glucose-Capillary 134 (H) 70 - 99 mg/dL   Comment 1 Notify RN    Comment 2 Document in Chart  Glucose, capillary     Status: None   Collection Time: 02/15/21  8:20 PM  Result Value Ref Range   Glucose-Capillary 99 70 - 99 mg/dL   Comment 1 Notify RN    Comment 2 Document in Chart   Glucose, capillary     Status: None   Collection Time: 02/15/21 11:15 PM  Result Value Ref Range   Glucose-Capillary 97 70 - 99 mg/dL   Comment 1 Notify RN    Comment 2 Document in Chart   CBC with Differential/Platelet     Status: Abnormal   Collection Time: 02/16/21  2:17 AM  Result Value Ref Range   WBC 9.8 4.0 - 10.5 K/uL   RBC 3.58 (L) 3.87 - 5.11 MIL/uL   Hemoglobin 10.8 (L) 12.0 - 15.0 g/dL   HCT 33.0 (L) 36.0 - 46.0 %   MCV 92.2 80.0 - 100.0 fL   MCH 30.2 26.0 - 34.0 pg   MCHC 32.7 30.0 - 36.0 g/dL   RDW 12.6 11.5 - 15.5 %   Platelets 246 150 - 400 K/uL    nRBC 0.0 0.0 - 0.2 %   Neutrophils Relative % 82 %   Neutro Abs 8.0 (H) 1.7 - 7.7 K/uL   Lymphocytes Relative 10 %   Lymphs Abs 1.0 0.7 - 4.0 K/uL   Monocytes Relative 7 %   Monocytes Absolute 0.7 0.1 - 1.0 K/uL   Eosinophils Relative 0 %   Eosinophils Absolute 0.0 0.0 - 0.5 K/uL   Basophils Relative 0 %   Basophils Absolute 0.0 0.0 - 0.1 K/uL   Immature Granulocytes 1 %   Abs Immature Granulocytes 0.07 0.00 - 0.07 K/uL  Comprehensive metabolic panel     Status: Abnormal   Collection Time: 02/16/21  2:17 AM  Result Value Ref Range   Sodium 128 (L) 135 - 145 mmol/L   Potassium 4.1 3.5 - 5.1 mmol/L   Chloride 96 (L) 98 - 111 mmol/L   CO2 24 22 - 32 mmol/L   Glucose, Bld 99 70 - 99 mg/dL   BUN 18 6 - 20 mg/dL   Creatinine, Ser 0.72 0.44 - 1.00 mg/dL   Calcium 8.4 (L) 8.9 - 10.3 mg/dL   Total Protein 7.4 6.5 - 8.1 g/dL   Albumin 2.7 (L) 3.5 - 5.0 g/dL   AST 19 15 - 41 U/L   ALT 13 0 - 44 U/L   Alkaline Phosphatase 28 (L) 38 - 126 U/L   Total Bilirubin 0.8 0.3 - 1.2 mg/dL   GFR, Estimated >60 >60 mL/min   Anion gap 8 5 - 15  Magnesium     Status: None   Collection Time: 02/16/21  2:17 AM  Result Value Ref Range   Magnesium 1.8 1.7 - 2.4 mg/dL  Phosphorus     Status: Abnormal   Collection Time: 02/16/21  2:17 AM  Result Value Ref Range   Phosphorus 2.1 (L) 2.5 - 4.6 mg/dL  CK     Status: None   Collection Time: 02/16/21  2:17 AM  Result Value Ref Range   Total CK 169 38 - 234 U/L  Glucose, capillary     Status: None   Collection Time: 02/16/21  3:41 AM  Result Value Ref Range   Glucose-Capillary 90 70 - 99 mg/dL   Comment 1 Notify RN    Comment 2 Document in Chart   Osmolality     Status: None   Collection Time: 02/16/21  5:04 AM  Result  Value Ref Range   Osmolality 285 275 - 295 mOsm/kg  .Hepatitis B Surface Antigen     Status: None   Collection Time: 02/16/21  7:49 AM  Result Value Ref Range   Hepatitis B Surface Ag NON REACTIVE NON REACTIVE  Hepatitis B core  antibody, total     Status: Abnormal   Collection Time: 02/16/21  7:49 AM  Result Value Ref Range   Hep B Core Total Ab Reactive (A) NON REACTIVE  Hepatitis C antibody     Status: None   Collection Time: 02/16/21  7:49 AM  Result Value Ref Range   HCV Ab NON REACTIVE NON REACTIVE  Sodium, urine, random     Status: None   Collection Time: 02/16/21  8:00 AM  Result Value Ref Range   Sodium, Ur 26 mmol/L  Osmolality, urine     Status: Abnormal   Collection Time: 02/16/21  8:00 AM  Result Value Ref Range   Osmolality, Ur 109 (L) 300 - 900 mOsm/kg  Glucose, capillary     Status: None   Collection Time: 02/16/21  8:32 AM  Result Value Ref Range   Glucose-Capillary 93 70 - 99 mg/dL  Glucose, capillary     Status: None   Collection Time: 02/16/21 12:30 PM  Result Value Ref Range   Glucose-Capillary 94 70 - 99 mg/dL     Imaging: DG CHEST PORT 1 VIEW  Result Date: 02/16/2021 CLINICAL DATA:  Dyspnea. EXAM: PORTABLE CHEST 1 VIEW COMPARISON:  02/09/2021 FINDINGS: The heart size and mediastinal contours are within normal limits. Both lungs are clear. The visualized skeletal structures are unremarkable. IMPRESSION: No active disease. Electronically Signed   By: Kerby Moors M.D.   On: 02/16/2021 05:59   Overnight EEG with video  Result Date: 02/16/2021 Lora Havens, MD     02/16/2021  7:42 AM Patient Name: Jamie [name] MRN: [id number] Epilepsy Attending: Lora Havens Referring Physician/Provider: Dr Kerney Elbe Duration: 02/15/2021  1021 to 02/16/2021 0730  Patient history:  20 y.o. female with PMH significant for Hashimoto thyroiditis who presents with vomiting, headache and not eating for 4 days and not talking since today. EEG to evaluate for seizure  Level of alertness: lethargic  AEDs during EEG study: None  Technical aspects: This EEG study was done with scalp electrodes positioned according to the 10-20 International system of electrode placement. Electrical activity was  acquired at a sampling rate of 500Hz  and reviewed with a high frequency filter of 70Hz  and a low frequency filter of 1Hz . EEG data were recorded continuously and digitally stored.  Description: EEG showed continuous generalized rhythmic 2-3 Hz  sharply contoured delta slowing. Hyperventilation and photic stimulation were not performed.    ABNORMALITY - Generalized rhythmic delta activity ( GRDA)  IMPRESSION: This study showed generalized rhythmic delta activity which is on the ictal-interictal continuum with low potential for seizures. There is also severe diffuse encephalopathy, nonspecific etiology. No seizures or definite epileptiform discharges were seen throughout the recording.  Priyanka [name]    Assessment/Plan:  Principal Problem:   Encephalitis Active Problems:   Primary hypothyroidism   Catatonia associated with another mental disorder  Patient Summary: Jamie [name] is a 20 y.o. with a pertinent PMH of Hashimoto thyroiditis, who presented with confusion and admitted for encephalopathy.   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 Completed 5/5 days IV methylprednisolone (10/22). Ativan trial for catatonia ineffective and discontinued per neurology on 10/23.  MRI Head  with and without contrast negative for any acute pathology.  Qualitative HSV, VZV, EBV, CMV in CSF negative. RPR negative. Hep C Ab nonreactive. Cryptococcal antigen negative. Anti-NMDA Ig positive, will R/O ovarian teratoma with transvaginal ultrasound (10/25).  Pending quantitative Ig levels, QuantiFERON, Hep B sAg, core Ab, Ab, JC virus, SPEP. Pending Autoantibody panel, ANA, meningitis/encephalitis panel PCR, MTBE RIF. Neurology is considering rituximab if hep B and C Ab are nonreactive.  Greatly appreciate neurology and ID's assistance Pastoral consult placed, appreciate assistance IVIG day 4/5, IV methylprednisolone day 5/5 (10/22) Transvaginal ultrasound  planned for today  #Possible Seizure Concerned for seizures, as patient was witnessed having seizure-like activities x2 on night of 10/24. However overnight EEG did not show seizures or definite epileptiform discharges. It did show activity that is on the ictal-interictal continuum with low potential for seizures, and severe diffuse nonspecific encephalopathy. She received a Keppra loading dose (10/24). No hypoglycemia. Hyponatremia 2/2 IVIG treatment. CK 169. Repeat overnight EEG. Continue IV Keppra 1000 mg twice daily Follow-up on repeat overnight EEG with video monitoring  #Iso-osmolar hyponatremia Given normal serum osmolality, suspect 2/2 IVIG treatment. Serum osmolality 285. Will continue to monitor.  #Urinary Retention While patient periodically voided large amounts on her own, 2+ bladder scans with over 200cc's of urine. Foley catheter placed.  Foley placed day 3 (start 10/23)  #Hypothyroidism 2/2 Hashimoto Disease Continue IV levothyroxine 50 mcg daily  #Malnutrition Secondary to encephalopathy and lack of p.o. intake.  Core track tube placed 02/12/21, then removed 10/24.  Magnesium and phosphate stable. Holding tube feeds today, will plan for replace tube tomorrow (10/26). Appreciate dietitian assistance recommendation. 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 We will obtain further history from mom. Will contact Health Department prior to D/C.  Diet: Feeding supplement per RD VTE: Place and maintain sequential compression device Start: 02/08/21 0847  Code: Full Code  PT/OT recs:  Pending Family Update: Family at bedside  Dispo: Anticipated discharge pending further workup  Merrily Brittle, DO Psychiatry Resident, PGY-1 [facility] Pager: [phone]  Please contact the on call pager after 5 pm  and on weekends at [phone].

## 2021-02-16 NOTE — Progress Notes (Signed)
NEUROLOGY CONSULTATION PROGRESS NOTE   Date of service: February 16, 2021 Patient Name: Jamie [name] MRN:  [id number] DOB:  [birth year]  Brief HPI  Jamie [name] is a 20 y.o. 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 intake for 4 days and decreased verbalization 10/15 prior to arrival. Initial work-up revealed a mildly elevated creatinine, TSH of 10.09, negative ETOH, and an abnormally elevated thyroperoxidase Ab of 268.  Extensive workup with MRI Brain normal, LP with lymphocytic pleocytosis, mildly elevated protein, elevated IgG index with negative cultures, negative HSV, VZV.  Her NMDA Receptor IgG elevated to > 1:2560. 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  She is s/p 5 days of IV Solumedrol and is to get her 4th dose of IVIG today.   Interval Hx   Concern for seizure activity last night and given Ativan 4mg  and started on Keppra. EEG with continuous generalized rhythmic 2-3 Hz  sharply contoured delta slowing with low potential for seizures.  Vitals   Vitals:   02/16/21 0554 02/16/21 0715 02/16/21 0731 02/16/21 0838  BP: 94/61 105/71  (P) 112/79  Pulse: (!) 104 85 83 (P) 92  Resp: 20 15 18  (P) 17  Temp: 99.6 F (37.6 C)   (P) 99.4 F (37.4 C)  TempSrc: Axillary   (P) Axillary  SpO2:  100% 100% (P) 100%  Weight:         Body mass index is 23.73 kg/m.  Physical Exam   General: Laying comfortably in bed; somnolent. HENT: Normal oropharynx and mucosa. Normal external appearance of ears and nose.  Neck: Supple, no pain or tenderness  CV: No JVD. No peripheral edema.  Pulmonary: Symmetric Chest rise. Normal respiratory effort.  Abdomen: Soft to touch, non-tender.  Ext: No cyanosis, edema, or deformity  Skin: No rash. Normal palpation of skin.   Musculoskeletal: Normal digits and nails by inspection. No clubbing.   Neurologic Examination  Mental  status/Cognition: eyes closed, no reponse to loud voice or clap. Grimaces to noxious stimuli. Grimaces to nares stimulation. Speech/language: No speech today, too encephalopathic to be able to assess her speech. Cranial nerves:   CN II Pupils equal and reactive to light, unable to assess for visual fied defects 2/2 eyes closed.   CN III,IV,VI EOMI intact to dolls eyes   CN V Corneals intact BL   CN VII no asymmetry, no nasolabial fold flattening   CN VIII No response to speech   CN IX & X Unable to assess, seems to be protecting her airway so far.   CN XI    CN XII    Motor:  Muscle bulk: poor, tone flaccid in all extremities. No spontaneous movement noted in any extremities. Does withdraw all extremities to pinch.  Reflexes:  Right Left Comments  Pectoralis      Biceps (C5/6) 1 1   Brachioradialis (C5/6) 1 1    Triceps (C6/7) 1 1    Patellar (L3/4) 1 1    Achilles (S1)      Hoffman      Plantar mute mute   Jaw jerk    Sensation: Grimaces to pain in all extremities.  Coordination/Complex Motor:  Unable to assess coordination.  Labs   Basic Metabolic Panel:  Lab Results  Component Value Date   NA 128 (L) 02/16/2021   K 4.1 02/16/2021   CO2 24 02/16/2021   GLUCOSE 99 02/16/2021   BUN 18 02/16/2021  CREATININE 0.72 02/16/2021   CALCIUM 8.4 (L) 02/16/2021   GFRNONAA >60 02/16/2021   HbA1c: No results found for: HGBA1C LDL:  Lab Results  Component Value Date   LDLCALC 62 02/09/2021   Urine Drug Screen:     Component Value Date/Time   LABOPIA NONE DETECTED 02/07/2021 0600   COCAINSCRNUR NONE DETECTED 02/07/2021 0600   LABBENZ NONE DETECTED 02/07/2021 0600   AMPHETMU NONE DETECTED 02/07/2021 0600   THCU NONE DETECTED 02/07/2021 0600   LABBARB NONE DETECTED 02/07/2021 0600    Alcohol Level     Component Value Date/Time   ETH <10 02/06/2021 1435   No results found for: PHENYTOIN, ZONISAMIDE, LAMOTRIGINE, LEVETIRACETA No results found for: PHENYTOIN,  PHENOBARB, VALPROATE, CBMZ  Imaging and Diagnostic studies   MR BRAIN W/WO CONTRAST Officialy read as normal MRI of brain but there is some concern for subtle enhancement of the posterior colliculi on axial post contrast imaging.  N-methyl-D-Aspartate Recpt.IgG >1:2560   CSF IgG index: 1.4  Oligoclonal Bands: Three (3) oligoclonal bands were observed in the CSF, which were not detected in the serum sample.   CSF WBC: 318, 94% lymphocytes.  CSF Protein: 50  CSF Glucose: 68.  CSF culture with gram stain: PMN seen but no organisms seen on smear, no growth so far. CSF Fungal Cx: NO FUNGUS ISOLATED AFTER 7 DAYS.  Serum HIV 1/2: non reactive.  CSF Cryptococcus Ag: negative  CMV CSF: negative  HSV 1/2 PCR CSF: Negative  VZV PCR CSF: negative.   Impression   Jamie [name] is a 20 y.o. 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 intake for 4 days and decreased verbalization 10/15 prior to arrival. Initial concerns for Hashimoto's encephalitis but extensive workup with CSF pleocytosis, positive NMDA receptor IgG, elevated IgG index and 3 oligoclonal bands in the CSF that are not noted in the serum. 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 She completed 5 days of high dose IV Solumedrol and is on IVIG Q24 hrs x 5 doses with her 4th dose to be administered today.  Recommendations  - Continue IVIG x 5 doses - I do not think PLEX would be beneficial in her case. Oligoclonal bands and IgG index is suggestive of production oif antibodies in CNS rather than peripheral antibodies. PLEX is excellent at removing peripheral Antibodies from serum but unlikely to be much help when antibodies are primarily being produced in the CNS. - I discussed rituximab as a potential option  with patient's mother an aunt and discussed risks and benefits including risks for oportunistic infections and potential allergic reactions. Small chance that this might have an effect on her fertility but cyclophosphamide is much more likely to cause issues with fertility. - Quantitative immunoglobulin levels, Quantiferon, Hep B sAg, core Ab, Hep C Ab, JC virus, SPEP ordered and pending. - Mother and aunt mentioned concern for potential carrier for typhoid. 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 Will discuss the concerns with the primary team, this unfortunately is not my area of expertise and I think ID and Primary team may be able to provide more insight. - Will also reach out and discuss with Dr. Arlice Colt with the Neuro-immunology team at  [facility] today and consider starting Rituximab today along with pretreatment. - Will continue LTM overnight. - Will continue Keppra 1G  Q12 hours for now. - I educated mom to press the EEG button if there are any concerns for seizures. _____________________________________________________________________   Plan discussed with patient's mother at bedside, discussed with patient's aunt over the phone. Will reach out to primary team over secure chat.  Thank you for the opportunity to take part in the care of this patient. If you have any further questions, please contact the neurology consultation attending.  Signed,  [place] Pager Number [phone]

## 2021-02-16 NOTE — Progress Notes (Signed)
CLINICAL UPDATE:  Paged by RN that the patient had another seizure. Evaluated the patient at the beside and the patient was noted to have periodic episodes of convulsions and her bilateral upper extremities were rigid and flexed. Her eyes were rolled back and the patient was not back to her baseline. She was also noted to be warm and very diaphoretic.   Patient was afebrile, but tachycardic. Neurologically, she was still not responding to verbal commands. PERRL, however, bilateral vertical nystagmus was noted. The patient was started on a 1500 mg keppra load, which had not finished by the time she began seizing again. Gave 4 mg of ativan and the patient's convulsions lessened, however, her upper extremities remained rigid and flexed.   Spoke with neurology who recommended finishing the Keppra load, followed by Keppra 1000 mg BID. Also recommended to hold off on further ativan and to call them if the patient has another seizure. The patient's facial movements/nystagmus could be 2/2 encephalitis vs seizure. Also spoke with PCCM who will evaluate the patient.    Buddy Duty, DO PGY-1, Internal Medicine Resident [phone]

## 2021-02-16 NOTE — Procedures (Addendum)
EDITED REPORT  Patient Name: Jamie [name]  MRN: [id number]  Epilepsy Attending: Lora Havens  Referring Physician/Provider: Dr Kerney Elbe Duration: 02/15/2021  1021 to 02/16/2021 1021   Patient history:  20 y.o. female with PMH significant for Hashimoto thyroiditis who presents with vomiting, headache and not eating for 4 days and not talking since today. EEG to evaluate for seizure   Level of alertness: lethargic   AEDs during EEG study: None   Technical aspects: This EEG study was done with scalp electrodes positioned according to the 10-20 International system of electrode placement. Electrical activity was acquired at a sampling rate of 500Hz  and reviewed with a high frequency filter of 70Hz  and a low frequency filter of 1Hz . EEG data were recorded continuously and digitally stored.    Description: EEG showed continuous generalized rhythmic 2-3 Hz  sharply contoured delta slowing.  One seizure was recorded on 02/16/2021 at 0054. During the seizure patient turned to the right side followed by generalized stiffening followed by whole body generalized tonic-clonic seizure-like activity. Concomitant EEG initially showed 9 to 10 Hz alpha activity in right frontocentral region which then involved all of left hemisphere and eventually evolved into 2 to 3 Hz sharply contoured delta slowing. Duration of the seizure was about 1 minute. Hyperventilation and photic stimulation were not performed.     Patient event button was pressed on 02/15/2021 at 1455 for left hand shaking.  Concomitant EEG before, during and after the event did not show any EEG changes suggest seizure.  Patient event button was pressed on 02/16/2021 at 0149 during which patient was noted to be thrashing in bed and twitching.  Concomitant EEG before, during and after the event did not show any EEG changes suggest seizure.   ABNORMALITY -Focal seizure, right frontocentral region - Generalized rhythmic delta activity (  GRDA)   IMPRESSION: This study showed one seizure arising from right frontocentral region on 02/16/2021 at 0054. During the seizure patient  turned to the right side followed by whole body stiffening and eventually generalized tonic-clonic seizure-like activity.  Duration of the seizure about 1 minute.  Additionally EEG showed generalized rhythmic delta activity which is on the ictal-interictal continuum with low potential for seizures. There is also severe diffuse encephalopathy, nonspecific etiology.   Patient event button was pressed on 02/15/2021 at 1455 for left hand shaking without concomitant EEG change. However, focal motor seizures may not be seen on scalp EEG.  Therefore clinical correlation is recommended.  Patient event button was pressed on 02/15/2021 at 1455 during which patient was noted to be thrashing in bed and twitching without concomitant EEG change.  This was most likely not an epileptic event.  Postictal confusion could be one of the differentials.   Jamie [name]

## 2021-02-16 NOTE — Progress Notes (Signed)
Checked on her, family at bedside. Still protecting airway for now Drop in sodium noted likely related to seizure Will check ABG, if looks okay PCCM available PRN, reach out if any worsening distress  Erskine Emery MD PCCM

## 2021-02-16 NOTE — Progress Notes (Signed)
CLINICAL UPDATE:  Paged by RN for Yellow MEWS w/ increased work of breathing. Dr. Raymondo Band and I evaluated patient at bedside. She is currently unable to provide history given patient's encephalopathy. RN reports she recently has been more dyspneic than prior and having more secretions.   Vitals: Afebrile, HR 90, RR 20, MAP >80, sating 98% on room air. General: Resting comfortably, no acute distress CV: Regular rate, rhythm. Extremities warm. Pulm: Mild increased work of breathing. Anteriorly clear to auscultation. Neuro: Somnolent, not responding to verbal commands.  Jamie [name] is 20yo admitted 10/16 for acute encephalopathy, concerning for [place] encephalitis. Earlier this evening, patient pulled out NG Tube. Currently patient appears to be protecting airway, although having increased secretions. Given she is having mildly increased work of breathing will obtain chest x-ray for further evaluation.  If she is unable to protect airway will contact PCCM for further assistance.  - F/u CXR  Sanjuan Dame, MD Internal Medicine PGY-2 [phone]

## 2021-02-16 NOTE — Plan of Care (Signed)

## 2021-02-17 ENCOUNTER — Inpatient Hospital Stay (HOSPITAL_COMMUNITY): Payer: Medicaid Other

## 2021-02-17 DIAGNOSIS — G0490 Encephalitis and encephalomyelitis, unspecified: Secondary | ICD-10-CM | POA: Diagnosis not present

## 2021-02-17 DIAGNOSIS — R4182 Altered mental status, unspecified: Secondary | ICD-10-CM | POA: Diagnosis not present

## 2021-02-17 DIAGNOSIS — F061 Catatonic disorder due to known physiological condition: Secondary | ICD-10-CM | POA: Diagnosis not present

## 2021-02-17 LAB — CBC WITH DIFFERENTIAL/PLATELET
Abs Immature Granulocytes: 0.06 10*3/uL (ref 0.00–0.07)
Basophils Absolute: 0 10*3/uL (ref 0.0–0.1)
Basophils Relative: 0 %
Eosinophils Absolute: 0 10*3/uL (ref 0.0–0.5)
Eosinophils Relative: 0 %
HCT: 32.4 % — ABNORMAL LOW (ref 36.0–46.0)
Hemoglobin: 10.7 g/dL — ABNORMAL LOW (ref 12.0–15.0)
Immature Granulocytes: 1 %
Lymphocytes Relative: 11 %
Lymphs Abs: 1 10*3/uL (ref 0.7–4.0)
MCH: 30.3 pg (ref 26.0–34.0)
MCHC: 33 g/dL (ref 30.0–36.0)
MCV: 91.8 fL (ref 80.0–100.0)
Monocytes Absolute: 0.6 10*3/uL (ref 0.1–1.0)
Monocytes Relative: 6 %
Neutro Abs: 7.2 10*3/uL (ref 1.7–7.7)
Neutrophils Relative %: 82 %
Platelets: 235 10*3/uL (ref 150–400)
RBC: 3.53 MIL/uL — ABNORMAL LOW (ref 3.87–5.11)
RDW: 12.7 % (ref 11.5–15.5)
WBC: 8.9 10*3/uL (ref 4.0–10.5)
nRBC: 0 % (ref 0.0–0.2)

## 2021-02-17 LAB — COMPREHENSIVE METABOLIC PANEL
ALT: 14 U/L (ref 0–44)
AST: 20 U/L (ref 15–41)
Albumin: 2.6 g/dL — ABNORMAL LOW (ref 3.5–5.0)
Alkaline Phosphatase: 28 U/L — ABNORMAL LOW (ref 38–126)
Anion gap: 7 (ref 5–15)
BUN: 17 mg/dL (ref 6–20)
CO2: 24 mmol/L (ref 22–32)
Calcium: 8.7 mg/dL — ABNORMAL LOW (ref 8.9–10.3)
Chloride: 99 mmol/L (ref 98–111)
Creatinine, Ser: 0.63 mg/dL (ref 0.44–1.00)
GFR, Estimated: >60 mL/min (ref >60)
Glucose, Bld: 85 mg/dL (ref 70–99)
Potassium: 3.6 mmol/L (ref 3.5–5.1)
Sodium: 130 mmol/L — ABNORMAL LOW (ref 135–145)
Total Bilirubin: 1.3 mg/dL — ABNORMAL HIGH (ref 0.3–1.2)
Total Protein: 8.5 g/dL — ABNORMAL HIGH (ref 6.5–8.1)

## 2021-02-17 LAB — GLUCOSE, CAPILLARY
Glucose-Capillary: 95 mg/dL (ref 70–99)
Glucose-Capillary: 94 mg/dL (ref 70–99)
Glucose-Capillary: 86 mg/dL (ref 70–99)
Glucose-Capillary: 90 mg/dL (ref 70–99)
Glucose-Capillary: 122 mg/dL — ABNORMAL HIGH (ref 70–99)

## 2021-02-17 LAB — HEPATITIS B SURFACE ANTIBODY, QUANTITATIVE: Hep B S AB Quant (Post): 856 m[IU]/mL (ref >9.9)

## 2021-02-17 LAB — MAGNESIUM: Magnesium: 2 mg/dL (ref 1.7–2.4)

## 2021-02-17 LAB — PHOSPHORUS: Phosphorus: 3.5 mg/dL (ref 2.5–4.6)

## 2021-02-17 LAB — EPSTEIN BARR VRS(EBV DNA BY PCR): EBV DNA QN by PCR: POSITIVE IU/mL

## 2021-02-17 MED ORDER — ORAL CARE MOUTH RINSE
15.0000 mL | OROMUCOSAL | Status: DC
  Administered 2021-02-17 – 2021-02-19 (×11): 15 mL via OROMUCOSAL

## 2021-02-17 MED ORDER — SENNOSIDES-DOCUSATE SODIUM 8.6-50 MG PO TABS: 1.0000 | ORAL_TABLET | Freq: Every day | ORAL | Status: DC

## 2021-02-17 MED ORDER — POLYETHYLENE GLYCOL 3350 17 G PO PACK
17.0000 g | PACK | Freq: Every day | ORAL | Status: DC
  Administered 2021-02-18: 17 g via ORAL
  Filled 2021-02-17 (×2): qty 1

## 2021-02-17 MED ORDER — ENOXAPARIN SODIUM 40 MG/0.4ML IJ SOSY
40.0000 mg | PREFILLED_SYRINGE | INTRAMUSCULAR | Status: DC
  Administered 2021-02-17 – 2021-03-08 (×20): 40 mg via SUBCUTANEOUS
  Filled 2021-02-17 (×20): qty 0.4

## 2021-02-17 MED ORDER — FREE WATER
30.0000 mL | Status: DC
  Administered 2021-02-17 – 2021-02-25 (×37): 30 mL

## 2021-02-17 MED ORDER — POTASSIUM CHLORIDE 10 MEQ/100ML IV SOLN
10.0000 meq | INTRAVENOUS | Status: AC
  Administered 2021-02-17 (×3): 10 meq via INTRAVENOUS
  Filled 2021-02-17 (×3): qty 100

## 2021-02-17 MED ORDER — SENNOSIDES-DOCUSATE SODIUM 8.6-50 MG PO TABS
1.0000 | ORAL_TABLET | Freq: Every day | ORAL | Status: DC
  Administered 2021-02-17 – 2021-02-22 (×5): 1 via NASOGASTRIC
  Filled 2021-02-17 (×5): qty 1

## 2021-02-17 NOTE — Consult Note (Signed)
[facility] Face-to-Face Psychiatry Consult   Reason for Consult:  Concern for catatonia  Referring Physician:  Gerlene Fee, MD Patient Identification: Quanasia Defino MRN:  [id number] Principal Diagnosis: Encephalitis Diagnosis:  Principal Problem:   Encephalitis Active Problems:   Primary hypothyroidism   Catatonia associated with another mental disorder Assessment  Theadora Noyes is a 20 y.o. female admitted medically for acute altered mental status with mutism with recent history of nausea and vomiting.  02/06/2021  2:17 PM patient carries no known past psychiatric history.  Patient carries known past medical history of Hashimoto's thyroiditis.  Psychiatry was consulted for acute change in altered mental status with concern for catatonia.  Patient was prescribed Synthroid outpatient; however, per EMR patient's mother endorsed patient had not been compliant.  On initial examination, patient is only able to answer very few questions with words and mostly responds with a high-pitched "hmm" as if she does not know what was asked. We plan to do an Ativan trial for possible catatonia as well as start low-dose antipsychotics to help with severe agitation that has been noted during hospitalization.   Patient had a family friend in room who endorsed the patient's mental status appears to wax and wane.  Increasing concern for delirium related to medical illness.  However, patient was able to endorse being in pain in her upper extremities.  We will attempt chemical restraints to help decrease frequency of severe agitation outbursts leading to physical restraints.   10/18: Seen with resident who wrote original consult note; agree with assessement and plan. Deferred ativan trial (pt was supposed to get ativan prior to MRI, was going to have nurse message to evaluate before/after does not appear to have gotten it). 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 Will likely do formal ativan challenge tomorrow if pt still unable to take PO - although agree with neurology that tx of underlying medical cause at this time is of primary importance, pts with catatonia 2/2 encephalitis can still often have some degree of response to standard treatments for catatonia such as lorazepam.    10/19: Patient appears less interactive today and did not appear to get agitated during visit. Patient had family at bedside who endorsed that patient's presentation continues to wax and wane. Overnight patient required assessment by primary team who noted increased rigidity in patient and patient was noted to have increased rigidity in her BLE and mild rigidity in her biceps. Patient scored a 10 on the Bush- Francis scale.  This is again concerning for catatonia. Patient is requiring physical restraints due episodes of hyperdelirium and there is concern that continued frequent use of these in conjunction with muscle rigidity could lead to muscle breakdown. It remains possible that patient's catatonic like presentation is 2/2 to an autoimmune encephalitis; however it may be beneficial to treat both underlying cause and symptomatic management of of more emergent catatonia symptoms to decrease chances of further decompensation.    10/21: Patient very difficult to arouse this AM. Attempted to see patient again in the PM. Patient was more responsive in the PM. Patient appeared to struggle to open her eyes, but appeared to attempt to interact to commands and let out a whimper. Patient had a Coretrack place as she has not PO intake in at least 2 days. BFCRS prior to Ativan 14. Post- Ativan:  Patient opened her eyes wider and able to maintain and blink, patient attempting to lift head and able to move both arms and squeeze both hands on command. Attempted to  communicate with patient via blinking; however this was inconsistent. Patient was also able to move L foot on command. Patient had decreased rigidity in BLE. Patient's positive response to Ativan trial remains convincing that patient is currently catatonic and this may be 2/2 encephalitis.    10/22: Patient sitting in bed this morning with mitt on left hand. Neurology and IM at bedside as well.Slow spontaneous upper extremity movements noted.  . Patient unable to follow commands this morning, not able to  squeeze hands or open eyes when asked. No documentation how how patient responded to ativan overnight. Spoke with nurse and requested documentation of response to ativan and to pass along to night team as this will provide helpful clinical information.   10/24: On assessment today patient appears worse.  Patient's lower extremities appear to be more rigid than during Ativan trial.  At this time we do recommend the Ativan be reconsidered post EEG.  Per EMR there appeared to be concern for oversedation over the weekend however, EMR also notes that patient was able to move her limbs with more intention.  On assessment today patient is not moving any of her limbs with intention and appears to have stereotypical movements.  Although formal both Dub Mikes catatonia scale was not done today, today was the first time where stereotypical movement were noted on exam and patient's rigidity has markedly increased.  Patient also was not able to follow any commands on assessment today.  If Ativan is restarted to help decrease lower extremity rigidity and risk for contractures could consider restarting at a lower dosage.   10/25 On assessment today patient remains connected to LTM EEG.  Overnight patient was noted to have seizure-like episode and received Ativan 4 mg.  Noticeably patient had less rigidity in her BLE on exam today.  It is recommended to consider Ativan as part of his overall care plan as patient appears to  respond well in terms of decreased rigidity when receiving Ativan.   10/26: Patient noted to be making grunting noises; indicating slight improvement in ability to protect her airway. Patient also not noted to have stereotypical movements and did have decreased rigidity on assessment. At this time patient remains withdrawn, immobile, and mute.    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  Titers noted >1: 2560. -Consider rheumatology consult - Recommend- Agitation protocol- Zyprexa 2.17m PRN (DO NOT GIVE within 30 min of Ativan due to risk of resp depression) - Consider depakote for antinflammatory effects. Depakote is also part of the catatonia algorithm and would be beneficial and maintinag decreased rigidity in patient during her encephalopathic state  - EKG 10/18 Qtc 420 - CT Chest/ Abdomen/ Pelvis: negative - MRI of head: No intracranial abnormalities noted, neurology concern for posterior colliculi subtle enhancement  Capacity eval Patient does not have capacity to make a medical decision regarding transvaginal [place] this time. Patient is not able to communicate that she has expressed a choice, understanding, reasoning,  or appreciation of the ultrasound or her diagnosis. Patient's mother is her next of kin. Would recommend interpreter be used when speaking with mother regarding treatment plans when mother must provide input on behalf of the patient. Mother speaks TSan Marin[place]anguage (preferred) and Amharic.  Capacity is fluctuating.Capacity is a functional assessment and a clinical determination about a specific decision. Source: Joya Gaskins [place], [place], [place]. Ten myths about decisionmaking capacity. J Am Med Dir Assoc. 2004;5(4):263-267.  The are four principle decision-making abilities that constitute capacity are:  understanding, expressing a choice, appreciation and reasoning.    Thank you for this consult.  Psychiatry will sign off.       Total Time spent with patient: 20 minutes  Subjective:   Geneieve Duell is a 20 y.o. female,  [place] female patient admitted with acute altered mental status and recent history of nausea and vomiting.  Patient has no known psychiatric history but does have a history of Hashimoto's thyroiditis.   HPI:  On assessment today patient is making grunting noises. Not able to delineate if patient is attempting to communicate or clearing her throat, but this is an improvement from yesterday.     Past Medical History:  Past Medical History:  Diagnosis Date  . Acquired autoimmune hypothyroidism    Dx 12/2014, TSH 110, FT4 0.3   History reviewed. No pertinent surgical history. Family History:  Family History  Problem Relation Age of Onset  . Healthy Mother   . Healthy Father     Social History:  Social History   Substance and Sexual Activity  Alcohol Use Not Currently     Social History   Substance and Sexual Activity  Drug Use Not Currently    Social History   Socioeconomic History  . Marital status: Single    Spouse name: Not on file  . Number of children: Not on file  . Years of education: Not on file  . Highest education level: Not on file  Occupational History  . Not on file  Tobacco Use  . Smoking status: Never  . Smokeless tobacco: Never  Substance and Sexual Activity  . Alcohol use: Not Currently  . Drug use: Not Currently  . Sexual activity: Not on file  Other Topics Concern  . Not on file  Social History Narrative   Lives at home with mom and maternal grandmother and two siblings attends [employer] is in the 8th grade.    Social Determinants of Health   Financial Resource Strain: Not on file  Food Insecurity: Not on file  Transportation Needs: Not on file  Physical Activity: Not on file  Stress:  Not on file  Social Connections: Not on file   Additional Social History:    Allergies:  No Known Allergies  Labs:  Results for orders placed or performed during the hospital encounter of 02/06/21 (from the past 48 hour(s))  Glucose, capillary     Status: Abnormal   Collection Time: 02/15/21 12:47 PM  Result Value Ref Range   Glucose-Capillary 121 (H) 70 - 99 mg/dL    Comment: Glucose reference range applies only to samples taken after fasting for at least 8 hours.   Comment 1 Notify RN    Comment 2 Document in Chart   Glucose, capillary     Status: Abnormal   Collection Time: 02/15/21  4:24 PM  Result Value Ref Range   Glucose-Capillary 134 (H) 70 - 99 mg/dL    Comment: Glucose reference range applies only to samples taken after fasting for at least 8 hours.   Comment 1 Notify RN    Comment 2 Document in Chart   Glucose, capillary     Status: None   Collection Time: 02/15/21  8:20 PM  Result Value Ref Range   Glucose-Capillary 99 70 - 99 mg/dL    Comment: Glucose reference range applies only to samples taken after fasting for at least 8 hours.   Comment 1 Notify RN    Comment 2 Document in Chart   Glucose, capillary     Status: None   Collection Time: 02/15/21 11:15 PM  Result Value Ref Range   Glucose-Capillary 97 70 - 99 mg/dL    Comment: Glucose reference range applies only to samples taken after fasting for at least 8 hours.   Comment 1 Notify RN    Comment 2 Document in Chart   CBC with Differential/Platelet     Status: Abnormal   Collection Time: 02/16/21  2:17 AM  Result Value Ref Range   WBC 9.8 4.0 - 10.5 K/uL   RBC 3.58 (L) 3.87 - 5.11 MIL/uL   Hemoglobin 10.8 (L) 12.0 - 15.0 g/dL   HCT 33.0 (L) 36.0 - 46.0 %   MCV 92.2 80.0 - 100.0 fL   MCH 30.2 26.0 - 34.0 pg   MCHC 32.7 30.0 - 36.0 g/dL   RDW 12.6 11.5 - 15.5 %   Platelets 246 150 - 400 K/uL   nRBC 0.0 0.0 - 0.2 %   Neutrophils Relative % 82 %   Neutro Abs 8.0 (H) 1.7 - 7.7 K/uL   Lymphocytes  Relative 10 %   Lymphs Abs 1.0 0.7 - 4.0 K/uL   Monocytes Relative 7 %   Monocytes Absolute 0.7 0.1 - 1.0 K/uL   Eosinophils Relative 0 %   Eosinophils Absolute 0.0 0.0 - 0.5 K/uL   Basophils Relative 0 %   Basophils Absolute 0.0 0.0 - 0.1 K/uL   Immature Granulocytes 1 %   Abs Immature Granulocytes 0.07 0.00 - 0.07 K/uL    Comment: Performed at [facility], [address], [place], [place] [address]  Comprehensive metabolic panel     Status: Abnormal   Collection Time: 02/16/21  2:17 AM  Result Value Ref Range   Sodium 128 (L) 135 - 145 mmol/L   Potassium 4.1 3.5 - 5.1 mmol/L   Chloride 96 (L) 98 - 111 mmol/L   CO2 24 22 - 32 mmol/L   Glucose, Bld 99 70 - 99 mg/dL    Comment: Glucose reference range applies only to samples taken after fasting for at least 8 hours.   BUN 18 6 - 20 mg/dL   Creatinine, Ser 0.72 0.44 - 1.00 mg/dL   Calcium 8.4 (L) 8.9 - 10.3 mg/dL   Total Protein 7.4 6.5 - 8.1 g/dL   Albumin 2.7 (L) 3.5 - 5.0 g/dL   AST 19 15 - 41 U/L   ALT 13 0 - 44 U/L   Alkaline Phosphatase 28 (L) 38 - 126 U/L   Total Bilirubin 0.8 0.3 - 1.2 mg/dL   GFR, Estimated >60 >60 mL/min    Comment: (NOTE) Calculated using the CKD-EPI Creatinine Equation (2021)    Anion gap 8 5 - 15    Comment: Performed at [facility], [place] [address], [place], [place] [address]  Magnesium     Status: None   Collection Time: 02/16/21  2:17 AM  Result Value Ref Range   Magnesium 1.8 1.7 - 2.4 mg/dL    Comment: Performed at [facility] [address], [place], [place] [address]  Phosphorus     Status: Abnormal   Collection Time: 02/16/21  2:17 AM  Result Value Ref Range   Phosphorus 2.1 (L) 2.5 - 4.6 mg/dL    Comment: Performed at [facility] [address], [place], [place] [address]  CK     Status: None   Collection Time: 02/16/21  2:17 AM  Result Value Ref Range   Total CK 169 38 - 234 U/L    Comment: Performed at [facility], [place] [address]  [place], [place] [address]  Glucose, capillary     Status: None   Collection Time: 02/16/21  3:41 AM  Result Value Ref Range   Glucose-Capillary 90 70 - 99 mg/dL    Comment: Glucose reference range applies only to samples taken after fasting for at least 8 hours.   Comment 1 Notify RN    Comment 2 Document in Chart   Osmolality     Status: None   Collection Time: 02/16/21  5:04 AM  Result Value Ref Range   Osmolality 285 275 - 295 mOsm/kg    Comment: Performed at [facility], [place] [address] [place], [place] [address]  .Hepatitis B Surface Antigen     Status: None   Collection Time: 02/16/21  7:49 AM  Result Value Ref Range   Hepatitis B Surface Ag NON REACTIVE NON REACTIVE    Comment: Performed at [facility] [address], [place], [place] [address]  Hepatitis B core antibody, total     Status: Abnormal   Collection Time: 02/16/21  7:49 AM  Result Value Ref Range   Hep B Core Total Ab Reactive (A) NON REACTIVE    Comment: Performed at [facility] [address], [place], [place] [address]  Hepatitis B surface antibody     Status: None   Collection Time: 02/16/21  7:49 AM  Result Value Ref Range   Hepatitis B-Post 856.0 Immunity>9.9 mIU/mL    Comment: (NOTE)  Status of Immunity                     Anti-HBs Level  ------------------                     -------------- Inconsistent with Immunity                   0.0 - 9.9 Consistent with Immunity                          >9.9 Performed At: [facility] [place], [place] [id number] Rush Farmer MD VW:[id number]   Hepatitis C antibody     Status: None   Collection Time: 02/16/21  7:49 AM  Result Value Ref Range   HCV Ab NON REACTIVE NON REACTIVE    Comment: (NOTE) Nonreactive HCV antibody screen is consistent with no HCV infections,  unless recent infection is suspected or other evidence exists to indicate HCV infection.  Performed at [facility], [place] [address]  [place], [place] [address]   Sodium, urine, random     Status: None   Collection Time: 02/16/21  8:00 AM  Result Value Ref Range   Sodium, Ur 26 mmol/L    Comment: Performed at [facility] [address], [place], [place] [address]  Osmolality, urine     Status: Abnormal   Collection Time: 02/16/21  8:00 AM  Result Value Ref Range   Osmolality, Ur 109 (L) 300 - 900 mOsm/kg  Comment: Performed at [facility], [place] [address] [place], [place] [address]  Glucose, capillary     Status: None   Collection Time: 02/16/21  8:32 AM  Result Value Ref Range   Glucose-Capillary 93 70 - 99 mg/dL    Comment: Glucose reference range applies only to samples taken after fasting for at least 8 hours.  Glucose, capillary     Status: None   Collection Time: 02/16/21 12:30 PM  Result Value Ref Range   Glucose-Capillary 94 70 - 99 mg/dL    Comment: Glucose reference range applies only to samples taken after fasting for at least 8 hours.  Glucose, capillary     Status: None   Collection Time: 02/16/21  3:43 PM  Result Value Ref Range   Glucose-Capillary 95 70 - 99 mg/dL    Comment: Glucose reference range applies only to samples taken after fasting for at least 8 hours.  Basic metabolic panel     Status: Abnormal   Collection Time: 02/16/21  4:30 PM  Result Value Ref Range   Sodium 134 (L) 135 - 145 mmol/L   Potassium 3.8 3.5 - 5.1 mmol/L   Chloride 102 98 - 111 mmol/L   CO2 27 22 - 32 mmol/L   Glucose, Bld 100 (H) 70 - 99 mg/dL    Comment: Glucose reference range applies only to samples taken after fasting for at least 8 hours.   BUN 15 6 - 20 mg/dL   Creatinine, Ser 0.57 0.44 - 1.00 mg/dL   Calcium 8.7 (L) 8.9 - 10.3 mg/dL   GFR, Estimated >60 >60 mL/min    Comment: (NOTE) Calculated using the CKD-EPI Creatinine Equation (2021)    Anion gap 5 5 - 15    Comment: Performed at [facility] [address], [place], [place] [address]  Glucose, capillary     Status: Abnormal    Collection Time: 02/16/21  7:22 PM  Result Value Ref Range   Glucose-Capillary 103 (H) 70 - 99 mg/dL    Comment: Glucose reference range applies only to samples taken after fasting for at least 8 hours.   Comment 1 Notify RN    Comment 2 Document in Chart   Glucose, capillary     Status: None   Collection Time: 02/16/21 11:38 PM  Result Value Ref Range   Glucose-Capillary 90 70 - 99 mg/dL    Comment: Glucose reference range applies only to samples taken after fasting for at least 8 hours.   Comment 1 Notify RN    Comment 2 Document in Chart   Glucose, capillary     Status: None   Collection Time: 02/17/21  3:46 AM  Result Value Ref Range   Glucose-Capillary 95 70 - 99 mg/dL    Comment: Glucose reference range applies only to samples taken after fasting for at least 8 hours.   Comment 1 Notify RN    Comment 2 Document in Chart   CBC with Differential/Platelet     Status: Abnormal   Collection Time: 02/17/21  4:20 AM  Result Value Ref Range   WBC 8.9 4.0 - 10.5 K/uL   RBC 3.53 (L) 3.87 - 5.11 MIL/uL   Hemoglobin 10.7 (L) 12.0 - 15.0 g/dL   HCT 32.4 (L) 36.0 - 46.0 %   MCV 91.8 80.0 - 100.0 fL   MCH 30.3 26.0 - 34.0 pg   MCHC 33.0 30.0 - 36.0 g/dL   RDW 12.7 11.5 - 15.5 %  Platelets 235 150 - 400 K/uL   nRBC 0.0 0.0 - 0.2 %   Neutrophils Relative % 82 %   Neutro Abs 7.2 1.7 - 7.7 K/uL   Lymphocytes Relative 11 %   Lymphs Abs 1.0 0.7 - 4.0 K/uL   Monocytes Relative 6 %   Monocytes Absolute 0.6 0.1 - 1.0 K/uL   Eosinophils Relative 0 %   Eosinophils Absolute 0.0 0.0 - 0.5 K/uL   Basophils Relative 0 %   Basophils Absolute 0.0 0.0 - 0.1 K/uL   Immature Granulocytes 1 %   Abs Immature Granulocytes 0.06 0.00 - 0.07 K/uL    Comment: Performed at [facility] [address] [place], [place] [address]  Comprehensive metabolic panel     Status: Abnormal   Collection Time: 02/17/21  4:20 AM  Result Value Ref Range   Sodium 130 (L) 135 - 145 mmol/L   Potassium 3.6 3.5 -  5.1 mmol/L   Chloride 99 98 - 111 mmol/L   CO2 24 22 - 32 mmol/L   Glucose, Bld 85 70 - 99 mg/dL    Comment: Glucose reference range applies only to samples taken after fasting for at least 8 hours.   BUN 17 6 - 20 mg/dL   Creatinine, Ser 0.63 0.44 - 1.00 mg/dL   Calcium 8.7 (L) 8.9 - 10.3 mg/dL   Total Protein 8.5 (H) 6.5 - 8.1 g/dL   Albumin 2.6 (L) 3.5 - 5.0 g/dL   AST 20 15 - 41 U/L   ALT 14 0 - 44 U/L   Alkaline Phosphatase 28 (L) 38 - 126 U/L   Total Bilirubin 1.3 (H) 0.3 - 1.2 mg/dL   GFR, Estimated >60 >60 mL/min    Comment: (NOTE) Calculated using the CKD-EPI Creatinine Equation (2021)    Anion gap 7 5 - 15    Comment: Performed at [facility], [place] [address] [place], [place] [address]  Magnesium     Status: None   Collection Time: 02/17/21  4:20 AM  Result Value Ref Range   Magnesium 2.0 1.7 - 2.4 mg/dL    Comment: Performed at [facility] [address] [place], [place] [address]  Phosphorus     Status: None   Collection Time: 02/17/21  4:20 AM  Result Value Ref Range   Phosphorus 3.5 2.5 - 4.6 mg/dL    Comment: Performed at [facility] [address], [place], [place] [address]  Glucose, capillary     Status: None   Collection Time: 02/17/21  7:28 AM  Result Value Ref Range   Glucose-Capillary 94 70 - 99 mg/dL    Comment: Glucose reference range applies only to samples taken after fasting for at least 8 hours.  Glucose, capillary     Status: None   Collection Time: 02/17/21 11:49 AM  Result Value Ref Range   Glucose-Capillary 86 70 - 99 mg/dL    Comment: Glucose reference range applies only to samples taken after fasting for at least 8 hours.    Current Facility-Administered Medications  Medication Dose Route Frequency Provider Last Rate Last Admin  . acetaminophen (TYLENOL) tablet 650 mg  650 mg Oral Q6H PRN Maudie Mercury, MD   650 mg at 02/14/21 0541   Or  . acetaminophen (TYLENOL) suppository 650 mg  650 mg Rectal Q6H PRN  Maudie Mercury, MD      . chlorhexidine gluconate (MEDLINE KIT) (PERIDEX) 0.12 % solution 15 mL  15  mL Mouth Rinse BID Sanjuan Dame, MD   15 mL at 02/17/21 0925  . Chlorhexidine Gluconate Cloth 2 % PADS 6 each  6 each Topical Daily Aldine Contes, MD   6 each at 02/17/21 0925  . feeding supplement (OSMOLITE 1.2 CAL) liquid 1,000 mL  1,000 mL Per Tube Continuous Merrily Brittle, DO   Stopped at 02/14/21 0910  . free water 30 mL  30 mL Per Tube Q4H Merrily Brittle, DO      . Immune Globulin 10% (PRIVIGEN) IV infusion 25 g  400 mg/kg Intravenous Q24 Hr x 5 Kerney Elbe, MD 0 mL/hr at 02/14/21 1828 25 g at 02/16/21 1607  . lactated ringers infusion   Intravenous Continuous Riesa Pope, MD 50 mL/hr at 02/16/21 1815 New Bag at 02/16/21 1815  . levETIRAcetam (KEPPRA) IVPB 1000 mg/100 mL premix  1,000 mg Intravenous Q12H Sanjuan Dame, MD 400 mL/hr at 02/16/21 2219 1,000 mg at 02/16/21 2219  . levothyroxine (SYNTHROID, LEVOTHROID) injection 50 mcg  50 mcg Intravenous Daily Merrily Brittle, DO   50 mcg at 02/16/21 1357  . MEDLINE mouth rinse  15 mL Mouth Rinse Q4H Charise Killian, MD      . pantoprazole sodium (PROTONIX) 40 mg/20 mL oral suspension 40 mg  40 mg Per Tube QHS Karren Cobble, RPH   40 mg at 02/14/21 2225  . senna-docusate (Senokot-S) tablet 1 tablet  1 tablet Per NG tube Q0600 Merrily Brittle, DO      . thiamine tablet 100 mg  100 mg Per Tube Daily Karren Cobble, [facility]       Psychiatric Specialty Exam: Unable to assess due to current state Presentation  General Appearance: Bizarre  Eye Contact:None  Speech:Other (comment) (non verbal)  Speech Volume:-- (non verbal)  Handedness:-- (unable to assess)   Mood and Affect  Mood:-- (unable to assess)  Affect:Flat   Thought Process  Thought Processes:-- (unable to assess)  Descriptions of Associations:-- (unable to assess)  Orientation:-- (unable to assess)  Thought Content:-- (unable to assess)  History  of Schizophrenia/Schizoaffective disorder:No data recorded Duration of Psychotic Symptoms:No data recorded Hallucinations:No data recorded Ideas of Reference:-- (unable to assess)  Suicidal Thoughts:No data recorded Homicidal Thoughts:No data recorded  Sensorium  Memory:-- (unable to assess)  Judgment:-- (unable to assess)  Insight:-- (unable to assess)   Executive Functions  Concentration:-- (unable to assess)  Attention Span:-- (unable to assess)  Recall:-- (unable to assess)  Fund of Knowledge:-- (unable to assess)  Language:-- (unable to assess)   Psychomotor Activity  Psychomotor Activity:No data recorded  Assets  Assets:Social Support   Sleep  Sleep:No data recorded  Physical Exam: Physical Exam Pulmonary:     Comments: Grunting, protecting airway from saliva Musculoskeletal:     Comments: Extremities are less rigid  Skin:    Comments: Hypopigmented non-raised lesions on the legs are lights in color but have descended to the mid shin on both legs.  Pattern continues to appear as a rings circumference the leg and spaced out as the ring repeats and moves down the leg   ROS Blood pressure (P) 106/73, pulse (P) 91, temperature (P) 98 F (36.7 C), temperature source (P) Axillary, resp. rate (P) 18, weight 58.9 kg, last menstrual period 01/11/2021, SpO2 (P) 98 %. Body mass index is 23.73 kg/m.   PGY-2 Freida Busman, MD 02/17/2021 12:19 PM

## 2021-02-17 NOTE — Procedures (Signed)
Cortrak  Person Inserting Tube:  Jamie [name], Jamie [name], RD Tube Type:  Cortrak - 43 inches Tube Size:  10 Tube Location:  Left nare Initial Placement:  Stomach Secured by: Bridle Technique Used to Measure Tube Placement:  Marking at nare/corner of mouth Cortrak Secured At:  69 cm  Cortrak Tube Team Note:  Consult received to place a Cortrak feeding tube.   X-ray is required, abdominal x-ray has been ordered by the Cortrak team. Please confirm tube placement before using the Cortrak tube.   If the tube becomes dislodged please keep the tube and contact the Cortrak team at [email or web address] ([email or web address]) for replacement.  If after hours and replacement cannot be delayed, place a NG tube and confirm placement with an abdominal x-ray.    Larkin Ina, MS, RD, LDN (she/her/hers) RD pager number and weekend/on-call pager number located in [place].

## 2021-02-17 NOTE — Procedures (Addendum)
Patient Name: Jamie [name]  MRN: [id number]  Epilepsy Attending: Lora Havens  Referring Physician/Provider: Dr Kerney Elbe Duration: 02/16/2021 1021 to 02/17/2021 1021   Patient history:  20 y.o. female with PMH significant for Hashimoto thyroiditis who presents with vomiting, headache and not eating for 4 days and not talking since today. EEG to evaluate for seizure   Level of alertness: lethargic   AEDs during EEG study: None   Technical aspects: This EEG study was done with scalp electrodes positioned according to the 10-20 International system of electrode placement. Electrical activity was acquired at a sampling rate of 500Hz  and reviewed with a high frequency filter of 70Hz  and a low frequency filter of 1Hz . EEG data were recorded continuously and digitally stored.    Description: EEG showed continuous generalized high amplitude rhythmic 2-3 Hz  sharply contoured delta slowing.    ABNORMALITY - Generalized rhythmic delta activity ( GRDA)   IMPRESSION: This study is suggestive of severe diffuse encephalopathy, nonspecific etiology. No seizures or definite epileptiform discharges were seen during the study.    [name] [name]

## 2021-02-17 NOTE — Progress Notes (Signed)
Nutrition Follow-up  DOCUMENTATION CODES:  Not applicable  INTERVENTION:  Once Cortrak tube replaced, recommend the following TF regimen. Was tolerating well at goal rate prior to pt removing tube: Osmolite 1.2 @65ml /hr Free water flushes 50ml q4 hours to maintain tube patency  Regimen provides 1872kcal/day, 87g/day protein and 1465ml/day free water  Request weekly weights  NUTRITION DIAGNOSIS:  Inadequate oral intake related to acute illness (pt with AMS) as evidenced by meal completion < 25%.  GOAL:  Patient will meet greater than or equal to 90% of their needs  MONITOR:  PO intake, Labs, Weight trends, TF tolerance, Skin, I & O's  REASON FOR ASSESSMENT:  Consult Assessment of nutrition requirement/status  ASSESSMENT:  20 y.o. female with a PMHx of Hashimoto thyroiditis who is admitted with acute encephalopathy and catatonia.  10/21 - Cortrak feeding tube placed and TF initiated 10/24 - Pt removed cortrak tube  Pt remains minimally interactive and lethargic at this time. Noted that pt had several seizures over the last few days and also removed cortrak tube 10/24. Was previously tolerating at goal rate. Provider placed consult to have tube replaced today.  Oral diet remains in place, pt not taking in any POs at this time. No new weight, will request new to monitor for loss.  Also noted no BM has been recorded this admission (11 days). Unable to visualize bowels well on XR from 10/21 when tube was placed. Discussed with RN and attending.   Nutritionally Relevant Medications: Scheduled Meds:  pantoprazole sodium  40 mg Per Tube QHS   thiamine  100 mg Per Tube Daily   Continuous Infusions:  feeding supplement (OSMOLITE 1.2 CAL) Stopped (02/14/21 0910)   lactated ringers 50 mL/hr at 02/16/21 1815   levETIRAcetam 1,000 mg (02/16/21 2219)   potassium chloride 10 mEq (02/17/21 0925)   Labs Reviewed: Na 130 Bilirubin 1.3 SBG ranges from 90-103 mg/dL over the last 24  hours  NUTRITION - FOCUSED PHYSICAL EXAM: Flowsheet Row Most Recent Value  Orbital Region No depletion  Upper Arm Region No depletion  Thoracic and Lumbar Region No depletion  Buccal Region No depletion  Temple Region No depletion  Clavicle Bone Region Moderate depletion  Clavicle and Acromion Bone Region Moderate depletion  Scapular Bone Region No depletion  Dorsal Hand No depletion  Patellar Region No depletion  Anterior Thigh Region No depletion  Posterior Calf Region No depletion  Edema (RD Assessment) None  Hair Reviewed  Eyes Reviewed  Mouth Reviewed  Skin Reviewed  Nails Reviewed   Diet Order:   Diet Order             Diet regular Room service appropriate? Yes; Fluid consistency: Thin  Diet effective now                  EDUCATION NEEDS:  No education needs have been identified at this time  Skin:  Skin Assessment: Reviewed RN Assessment  Last BM:  pta - discussed with RN and provider, bowel regimen to be added.  Height:  Ht Readings from Last 1 Encounters:  01/15/21 5' 2.02" (1.575 m) (19 %, Z= -0.89)*   * Growth percentiles are based on CDC (Girls, 2-20 Years) data.   Weight:  Wt Readings from Last 1 Encounters:  02/12/21 58.9 kg (54 %, Z= 0.11)*   * Growth percentiles are based on CDC (Girls, 2-20 Years) data.   Ideal Body Weight:  50 kg  BMI:  Body mass index is 23.73 kg/m.  Estimated  Nutritional Needs:  Kcal:  1700-1900kcal/day Protein:  85-95g/day Fluid:  1.5-1.8L/day   Ranell Patrick, RD, LDN Clinical Dietitian RD pager # available in AMION  After hours/weekend pager # available in [facility]

## 2021-02-17 NOTE — Progress Notes (Signed)
LTM EEG discontinued - no skin breakdown at unhook.   

## 2021-02-17 NOTE — Progress Notes (Signed)
Routine maintenance performed. No skin break down noted. TEST button was tested.

## 2021-02-17 NOTE — Procedures (Signed)
Patient Name: Brette Cast  MRN: [id number]  Epilepsy Attending: Lora Havens  Referring Physician/Provider: Dr Kerney Elbe Duration: 02/17/2021 1021 to 02/17/2021 1511   Patient history:  20 y.o. female with PMH significant for Hashimoto thyroiditis who presents with vomiting, headache and not eating for 4 days and not talking since today. EEG to evaluate for seizure   Level of alertness: lethargic   AEDs during EEG study: None   Technical aspects: This EEG study was done with scalp electrodes positioned according to the 10-20 International system of electrode placement. Electrical activity was acquired at a sampling rate of 500Hz  and reviewed with a high frequency filter of 70Hz  and a low frequency filter of 1Hz . EEG data were recorded continuously and digitally stored.    Description: EEG showed continuous generalized high amplitude rhythmic 2-3 Hz  sharply contoured delta slowing.    ABNORMALITY - Generalized rhythmic delta activity ( GRDA)   IMPRESSION: This study is suggestive of severe diffuse encephalopathy, nonspecific etiology. No seizures or definite epileptiform discharges were seen during the study.    Joselynn Amoroso [name]

## 2021-02-17 NOTE — Progress Notes (Deleted)
HD 10 Subjective:  Overnight Events: None  Patient was seen this AM with sitter and mom at bedside. Patient is laying in bed at incline, with eyes closed, intermittent moaning, and some low volume gurgling noises. She is non-responsive to pain and has decreased muscle tone.  Objective:  Vital signs in last 24 hours: Vitals:   02/17/21 1315 02/17/21 1603 02/17/21 1652 02/17/21 1754  BP:  104/73    Pulse:  83    Resp:  (!) 23 20   Temp: 100 F (37.8 C) 97.6 F (36.4 C)  99.8 F (37.7 C)  TempSrc:  Axillary  Axillary  SpO2:  98%    Weight:       Supplemental O2:  SpO2: 98 % O2 Device: Nasal Cannula O2 Flow Rate (L/min): 2 L/min   Physical Exam:  Physical Exam Vitals and nursing note reviewed.  Constitutional:      General: She is not in acute distress.    Appearance: She is ill-appearing and diaphoretic.     Comments: Somnolent, responsive to pain  HENT:     Head: Normocephalic and atraumatic.     Mouth/Throat:     Comments: No sialorrhea Eyes:     Comments: Pupillary reflex intact  Cardiovascular:     Rate and Rhythm: Regular rhythm. Tachycardia present.     Heart sounds: Normal heart sounds.  Pulmonary:     Effort: Pulmonary effort is normal.     Breath sounds: Wheezing present.     Comments: Transmitted gurgling sounds from upper respiratory Abdominal:     Palpations: Abdomen is soft.  Musculoskeletal:     Right lower leg: No edema.     Left lower leg: No edema.  Skin:    General: Skin is warm.     Findings: Rash present.     Comments: Hyperpigmented rash still present on b/l thighs and down left shin, unchanged   Neurological:     Comments: Decreased tone and upper and lower extremities bilaterally.  No tremors, cogwheel rigidity, contractures noted.    Filed Weights   02/12/21 1409  Weight: 58.9 kg    Intake/Output Summary (Last 24 hours) at 02/17/2021 1901 Last data filed at 02/17/2021 1805 Gross per 24 hour  Intake --  Output 2200 ml  Net  -2200 ml   Net IO Since Admission: -1,972.17 mL [02/17/21 1901]  Pertinent Labs: Results for orders placed or performed during the hospital encounter of 02/06/21 (from the past 24 hour(s))  Glucose, capillary     Status: Abnormal   Collection Time: 02/16/21  7:22 PM  Result Value Ref Range   Glucose-Capillary 103 (H) 70 - 99 mg/dL   Comment 1 Notify RN    Comment 2 Document in Chart   Glucose, capillary     Status: None   Collection Time: 02/16/21 11:38 PM  Result Value Ref Range   Glucose-Capillary 90 70 - 99 mg/dL   Comment 1 Notify RN    Comment 2 Document in Chart   Glucose, capillary     Status: None   Collection Time: 02/17/21  3:46 AM  Result Value Ref Range   Glucose-Capillary 95 70 - 99 mg/dL   Comment 1 Notify RN    Comment 2 Document in Chart   CBC with Differential/Platelet     Status: Abnormal   Collection Time: 02/17/21  4:20 AM  Result Value Ref Range   WBC 8.9 4.0 - 10.5 K/uL   RBC 3.53 (L) 3.87 - 5.11 MIL/uL  Hemoglobin 10.7 (L) 12.0 - 15.0 g/dL   HCT 32.4 (L) 36.0 - 46.0 %   MCV 91.8 80.0 - 100.0 fL   MCH 30.3 26.0 - 34.0 pg   MCHC 33.0 30.0 - 36.0 g/dL   RDW 12.7 11.5 - 15.5 %   Platelets 235 150 - 400 K/uL   nRBC 0.0 0.0 - 0.2 %   Neutrophils Relative % 82 %   Neutro Abs 7.2 1.7 - 7.7 K/uL   Lymphocytes Relative 11 %   Lymphs Abs 1.0 0.7 - 4.0 K/uL   Monocytes Relative 6 %   Monocytes Absolute 0.6 0.1 - 1.0 K/uL   Eosinophils Relative 0 %   Eosinophils Absolute 0.0 0.0 - 0.5 K/uL   Basophils Relative 0 %   Basophils Absolute 0.0 0.0 - 0.1 K/uL   Immature Granulocytes 1 %   Abs Immature Granulocytes 0.06 0.00 - 0.07 K/uL  Comprehensive metabolic panel     Status: Abnormal   Collection Time: 02/17/21  4:20 AM  Result Value Ref Range   Sodium 130 (L) 135 - 145 mmol/L   Potassium 3.6 3.5 - 5.1 mmol/L   Chloride 99 98 - 111 mmol/L   CO2 24 22 - 32 mmol/L   Glucose, Bld 85 70 - 99 mg/dL   BUN 17 6 - 20 mg/dL   Creatinine, Ser 0.63 0.44 - 1.00  mg/dL   Calcium 8.7 (L) 8.9 - 10.3 mg/dL   Total Protein 8.5 (H) 6.5 - 8.1 g/dL   Albumin 2.6 (L) 3.5 - 5.0 g/dL   AST 20 15 - 41 U/L   ALT 14 0 - 44 U/L   Alkaline Phosphatase 28 (L) 38 - 126 U/L   Total Bilirubin 1.3 (H) 0.3 - 1.2 mg/dL   GFR, Estimated >60 >60 mL/min   Anion gap 7 5 - 15  Magnesium     Status: None   Collection Time: 02/17/21  4:20 AM  Result Value Ref Range   Magnesium 2.0 1.7 - 2.4 mg/dL  Phosphorus     Status: None   Collection Time: 02/17/21  4:20 AM  Result Value Ref Range   Phosphorus 3.5 2.5 - 4.6 mg/dL  Glucose, capillary     Status: None   Collection Time: 02/17/21  7:28 AM  Result Value Ref Range   Glucose-Capillary 94 70 - 99 mg/dL  Glucose, capillary     Status: None   Collection Time: 02/17/21 11:49 AM  Result Value Ref Range   Glucose-Capillary 86 70 - 99 mg/dL  Glucose, capillary     Status: None   Collection Time: 02/17/21  4:24 PM  Result Value Ref Range   Glucose-Capillary 90 70 - 99 mg/dL     Imaging: DG Abd Portable 1V  Result Date: 02/17/2021 CLINICAL DATA:  Feeding tube EXAM: PORTABLE ABDOMEN - 1 VIEW COMPARISON:  02/12/2021 FINDINGS: Esophageal tube tip overlies the distal stomach. Visible gas pattern unremarkable. IMPRESSION: Esophageal tube tip overlies the distal stomach Electronically Signed   By: Donavan Foil M.D.   On: 02/17/2021 17:28    Assessment/Plan:  Principal Problem:   Encephalitis Active Problems:   Primary hypothyroidism   Catatonia associated with another mental disorder  Patient Summary: Jamie [name] is a 20 y.o. with a pertinent PMH of Hashimoto thyroiditis, who presented with confusion and admitted for encephalopathy.   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 MRI Head with and without contrast negative  for acute pathology.  Qualitative HSV, VZV, EBV, CMV in CSF negative. RPR negative. HepC Ab  nonreactive. Cryptococcal antigen negative. Anti-NMDA Ig positive, will R/O ovarian teratoma with transvaginal ultrasound (10/25). ANA and Ro Ig positive. HepB sAg nonreactive and HepB cAb positive. Per ID, this shows recovery to acute infection, now immune. HBV DNA pending. Quantitative immunoglobulin levels, Quantiferon, JC virus, SPEP ordered and pending. Neurology will start rituximab if HBV is negative. Per family wishes, we will obtain transabdominal ultrasound prior to transvaginal. Discussed with family that transabdominal is not first line for visualizing ovarian tumors. Greatly appreciate neurology and ID's assistance Pastoral consult placed, appreciate assistance Continue telemetry Follow-up abdominal ultrasound  Treatment per neurology  #Focal seizure Evident on last overnight EEG (10/25), which showed focal seizure in right frontal central and generalized rhythmic delta activity (GRDA) She was witnessed having seizure-like activities x2 on night of 10/24. Received Keppra loading dose (10/24). Pseudo-hyponatremia 2/2 IVIG treatment.  Mg2+ >2, K+>4. Replete as necessary Continue IV Keppra 1000 mg BID  #Iso-osmolar pseudo-hyponatremia Given normal serum osmolality, suspect 2/2 IVIG treatment.  However given seizure this hospitalization, will have repeat serum and urine osmolality, along with urine sodium. Follow-up serum and urine osmolality Follow-up urine sodium  #Malnutrition Secondary to encephalopathy and lack of p.o. intake. Core track tube placed 02/12/21, then removed 10/24. Will replace again 10/26. Magnesium and phosphate stable.  Appreciate dietitian assistance recommendation Feeding supplement per dietitian Daily Mg 2+ and weekly phosphate for possible refeeding syndrome Continue thiamine daily Free water and feeding supplement daily  #Urinary Retention While patient periodically voided large amounts on her own, 2+ bladder scans with over 200cc's of urine. Foley  catheter placed.  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 Can chronically carry for over a 1 year. We will obtain further history from mom in regards to treatment, symptoms, timeline. Will contact Health Department prior to D/C Follow-up GI panel for salmonella hyphae  Diet: Feeding supplement per RD VTE: Place and maintain sequential compression device Start: 02/08/21 0847  Code: Full Code  PT/OT recs:  Pending Family Update: Family at bedside  Dispo: Anticipated discharge pending further workup   Merrily Brittle, DO Psychiatry Resident, PGY-1 [facility] Pager: [phone]  Please contact the on call pager after 5 pm and on weekends at [phone].

## 2021-02-17 NOTE — Progress Notes (Signed)
HD#10 Subjective:  Overnight Events: No acute events overnight  Patient resting in bed comfortably, somnolent.  She is having intermittent moaning and her eyes remain closed.  Minimal movements per her mother.  Objective:  Vital signs in last 24 hours: Vitals:   02/17/21 1233 02/17/21 1315 02/17/21 1603 02/17/21 1652  BP: 119/78  104/73   Pulse: 88  83   Resp: 19  (!) 23 (P) 20  Temp: (!) 101.1 F (38.4 C) 100 F (37.8 C) 97.6 F (36.4 C)   TempSrc: Axillary  Axillary   SpO2: 99%  98%   Weight:       Supplemental O2: Nasal Cannula SpO2: 98 % O2 Flow Rate (L/min): 2 L/min   Physical Exam:  Constitutional: No acute distress HENT: normocephalic atraumatic Eyes: PERRL Neck: supple Cardiovascular: regular rate and rhythm Pulmonary/Chest: normal work of breathing on room air Abdominal: soft, non-distended MSK: normal bulk and tone Neurological: Not arousable, spontaneous facial grimacing. Nonverbal, does not follow commands.  She does not gaze with any type of stimuli.  Toes downgoing bilaterally. Skin:-Hyper pigmented rash on lower extremities  Filed Weights   02/12/21 1409  Weight: 58.9 kg     Intake/Output Summary (Last 24 hours) at 02/17/2021 1715 Last data filed at 02/17/2021 0652 Gross per 24 hour  Intake --  Output 1600 ml  Net -1600 ml   Net IO Since Admission: -772.17 mL [02/17/21 1715]  Pertinent Labs: CBC Latest Ref Rng & Units 02/17/2021 02/16/2021 02/15/2021  WBC 4.0 - 10.5 K/uL 8.9 9.8 7.4  Hemoglobin 12.0 - 15.0 g/dL 10.7(L) 10.8(L) 10.8(L)  Hematocrit 36.0 - 46.0 % 32.4(L) 33.0(L) 33.9(L)  Platelets 150 - 400 K/uL 235 246 270    CMP Latest Ref Rng & Units 02/17/2021 02/16/2021 02/16/2021  Glucose 70 - 99 mg/dL 85 100(H) 99  BUN 6 - 20 mg/dL 17 15 18   Creatinine 0.44 - 1.00 mg/dL 0.63 0.57 0.72  Sodium 135 - 145 mmol/L 130(L) 134(L) 128(L)  Potassium 3.5 - 5.1 mmol/L 3.6 3.8 4.1  Chloride 98 - 111 mmol/L 99 102 96(L)  CO2 22 - 32  mmol/L 24 27 24   Calcium 8.9 - 10.3 mg/dL 8.7(L) 8.7(L) 8.4(L)  Total Protein 6.5 - 8.1 g/dL 8.5(H) - 7.4  Total Bilirubin 0.3 - 1.2 mg/dL 1.3(H) - 0.8  Alkaline Phos 38 - 126 U/L 28(L) - 28(L)  AST 15 - 41 U/L 20 - 19  ALT 0 - 44 U/L 14 - 13    Imaging: No results found.  Assessment/Plan:   Principal Problem:   Encephalitis Active Problems:   Primary hypothyroidism   Catatonia associated with another mental disorder   Patient Summary: Jamie [name] is a 20 y.o. with a pertinent PMH of Hashimoto thyroiditis, who presented with confusion, nausea, vomiting, and headaches and admitted for encephalopathy.    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  Do not suspect infectious etiology. Completed 5 days of IV steroids.  Day 5 of 5 for IVIG.  CT chest abdomen pelvis negative, will pursue ultrasound of the abdomen.  Mother would not like to perform transvaginal ultrasound at this time.  Family friend was used as Optometrist, she understands risks and benefits of performing transabdominal versus transvaginal including lower sensitivities of transabdominal.  Neurology are also recommending rituximab treatment.  Hepatitis be core antibody positive and surface antibody positive.  Consistent with recovery to acute infection with immunization.  ID was consulted  who note it is safe to proceed with immunosuppressive treatment. -Appreciate neurology, psychiatry, infectious disease team assistance in this patient's care -Completed IV steroids, day 5/5 IVIG -Transabdominal ultrasound to assess teratoma, if negative consider transvaginal -Plan to discuss rituximab with mother, pending quantitative immunoglobulin levels, QuantiFERON, JC virus, SPEP. -Immunized against hepatitis B  Seizure-like activity Patient with evidence of seizure-like activity on overnight EEG.  No seizure-like activity since Keppra  load. -Continue Keppra 1 g every 12 hours  Fever Patient febrile today with T-max of 101.1 Fahrenheit.  No leukocytosis or other symptoms of infectious etiology.  Because plan will be to start rituximab soon as possible, will start infectious work-up. -Chest x-ray, blood cultures x2, UA  Malnutrition Plan to place. NG tube back today, patient pulled out NG tube and had nosebleed 2 days ago.  At this time NG tube was removed. We will restart tube feeds -Repace NG tube today -Pending abdominal x-ray for confirmation of placement -Continue tube feeds once placed appropriately  Diet:  Per NG tube IVF: None,None VTE: SCDs Code: Full Family Update: Mother at bedside   Dispo: Pending further work-up and evaluation of her encephalopathy  Sanjuana Letters DO Internal Medicine Resident PGY-2 Pager [phone] Please contact the on call pager after 5 pm and on weekends at [phone].

## 2021-02-17 NOTE — Progress Notes (Signed)
ANTICOAGULATION CONSULT NOTE - Initial Consult  Pharmacy Consult for Lovenox Indication: VTE prophylaxis  No Known Allergies  Patient Measurements: Weight: 58.9 kg (129 lb 13.6 oz) Heparin Dosing Weight: 58.9 kg   Vital Signs: Temp: 99.8 F (37.7 C) (10/26 1754) Temp Source: Axillary (10/26 1754) BP: 104/73 (10/26 1603) Pulse Rate: 83 (10/26 1603)  Labs: Recent Labs    02/15/21 0354 02/16/21 0217 02/16/21 1630 02/17/21 0420  HGB 10.8* 10.8*  --  10.7*  HCT 33.9* 33.0*  --  32.4*  PLT 270 246  --  235  CREATININE 0.63 0.72 0.57 0.63  CKTOTAL  --  169  --   --     Estimated Creatinine Clearance: 89.5 mL/min (by C-G formula based on SCr of 0.63 mg/dL).   Medical History: Past Medical History:  Diagnosis Date   Acquired autoimmune hypothyroidism    Dx 12/2014, TSH 110, FT4 0.3    Medications:  Medications Prior to Admission  Medication Sig Dispense Refill Last Dose   levothyroxine (SYNTHROID) 100 MCG tablet Take 1 tablet (100 mcg total) by mouth daily. 90 tablet 3 Past Week    Assessment: 21 YOF who presented with encephalopathy to start Lovenox for VTE ppx.   H/H low stable. Plt wnl. SCr wnl   Goal of Therapy:  VTE prophylaxis Monitor platelets by anticoagulation protocol: Yes   Plan:  -Start Lovenox 40 mg daily  -Pharmacy to sign off as no further dosage adjustments anticipated   Albertina Parr, PharmD., BCPS, BCCCP Clinical Pharmacist Please refer to [facility] for unit-specific pharmacist

## 2021-02-17 NOTE — Progress Notes (Signed)
ID Progress Note-  20yo F to start immunosuppression has hx of HBcAb+ and HBsAb, showing recovery to acute infection, now immune. Will check HBV but suspect it will be undetectable. Can resume plan for receiving immunosuppression to treat NMDA Receptor encephalitis.  Jamie [name] for Infectious Diseases [phone]

## 2021-02-17 NOTE — Progress Notes (Signed)
NEUROLOGY CONSULTATION PROGRESS NOTE   Date of service: February 17, 2021 Patient Name: Jamie [name] MRN:  [id number] DOB:  03/19/2001  Brief HPI  Jamie [name] is a 20 y.o. 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 intake for 4 days and decreased verbalization 10/15 prior to arrival. Initial work-up revealed a mildly elevated creatinine, TSH of 10.09, negative ETOH, and an abnormally elevated thyroperoxidase Ab of 268.  Extensive workup with MRI Brain normal, LP with lymphocytic pleocytosis, mildly elevated protein, elevated IgG index with negative cultures, negative HSV, VZV.  Her NMDA Receptor IgG elevated to > 1:2560. 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  She is s/p 5 days of IV Solumedrol and is to get her 4th dose of IVIG today.   Interval Hx   No seizures overnight on cEEG. Somnolence. Some spontaneous movement today in RUE but also in BL lower extremities. Minimal thou.  Vitals   Vitals:   02/17/21 1200 02/17/21 1233 02/17/21 1315 02/17/21 1603  BP:  119/78  104/73  Pulse:  88  83  Resp:  19  (!) 23  Temp: 98 F (36.7 C) (!) 101.1 F (38.4 C) 100 F (37.8 C) 97.6 F (36.4 C)  TempSrc:  Axillary  Axillary  SpO2:  99%  98%  Weight:         Body mass index is 23.73 kg/m.  Physical Exam   General: Laying comfortably in bed; somnolent. HENT: Normal oropharynx and mucosa. Normal external appearance of ears and nose.  Neck: Supple, no pain or tenderness  CV: No JVD. No peripheral edema.  Pulmonary: Symmetric Chest rise. Normal respiratory effort.  Abdomen: Soft to touch, non-tender.  Ext: No cyanosis, edema, or deformity  Skin: No rash. Normal palpation of skin.   Musculoskeletal: Normal digits and nails by inspection. No clubbing.   Neurologic Examination  Mental status/Cognition: eyes closed, no reponse to loud voice or clap. Grimaces to noxious  stimuli. Grimaces to nares stimulation. Speech/language: No speech today, too encephalopathic to be able to assess her speech. Cranial nerves:   CN II Pupils equal and reactive to light, unable to assess for visual fied defects 2/2 eyes closed.   CN III,IV,VI EOMI intact to dolls eyes   CN V Corneals intact BL   CN VII no asymmetry, no nasolabial fold flattening   CN VIII No response to speech   CN IX & X Unable to assess, seems to be protecting her airway so far.   CN XI    CN XII    Motor:  Muscle bulk: poor, tone flaccid in all extremities. Some spontaneous movement noted in RUE and BL lower extremities today. No response to pinch.  Reflexes:  Right Left Comments  Pectoralis      Biceps (C5/6) 1 1   Brachioradialis (C5/6) 1 1    Triceps (C6/7) 1 1    Patellar (L3/4) 1 1    Achilles (S1)      Hoffman      Plantar mute mute   Jaw jerk    Sensation: Grimaces to pain in all extremities.  Coordination/Complex Motor:  Unable to assess coordination.  Labs   Basic Metabolic Panel:  Lab Results  Component Value Date   NA 130 (L) 02/17/2021   K 3.6 02/17/2021   CO2 24 02/17/2021   GLUCOSE 85 02/17/2021   BUN 17 02/17/2021   CREATININE 0.63 02/17/2021   CALCIUM 8.7 (L)  02/17/2021   GFRNONAA >60 02/17/2021   HbA1c: No results found for: HGBA1C LDL:  Lab Results  Component Value Date   LDLCALC 62 02/09/2021   Urine Drug Screen:     Component Value Date/Time   LABOPIA NONE DETECTED 02/07/2021 0600   COCAINSCRNUR NONE DETECTED 02/07/2021 0600   LABBENZ NONE DETECTED 02/07/2021 0600   AMPHETMU NONE DETECTED 02/07/2021 0600   THCU NONE DETECTED 02/07/2021 0600   LABBARB NONE DETECTED 02/07/2021 0600    Alcohol Level     Component Value Date/Time   ETH <10 02/06/2021 1435   No results found for: PHENYTOIN, ZONISAMIDE, LAMOTRIGINE, LEVETIRACETA No results found for: PHENYTOIN, PHENOBARB, VALPROATE, CBMZ  Imaging and Diagnostic studies   MR BRAIN W/WO  CONTRAST Officialy read as normal MRI of brain but there is some concern for subtle enhancement of the posterior colliculi on axial post contrast imaging.  N-methyl-D-Aspartate Recpt.IgG >1:2560   CSF IgG index: 1.4  Oligoclonal Bands: Three (3) oligoclonal bands were observed in the CSF, which were not detected in the serum sample.   CSF WBC: 318, 94% lymphocytes.  CSF Protein: 50  CSF Glucose: 68.  CSF culture with gram stain: PMN seen but no organisms seen on smear, no growth so far. CSF Fungal Cx: NO FUNGUS ISOLATED AFTER 7 DAYS.  Serum HIV 1/2: non reactive.  CSF Cryptococcus Ag: negative  CMV CSF: negative  HSV 1/2 PCR CSF: Negative  VZV PCR CSF: negative.   Impression   Jamie [name] is a 20 y.o. 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 intake for 4 days and decreased verbalization 10/15 prior to arrival. Initial concerns for Hashimoto's encephalitis but extensive workup with CSF pleocytosis, positive NMDA receptor IgG, elevated IgG index and 3 oligoclonal bands in the CSF that are not noted in the serum. 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 She completed 5 days of high dose IV Solumedrol and completed IVIG x 5 doses.  Of note, did have R frontal seizure on cEEG on 10/25, has not had any since she has been on Keppra 500mg  BID. EEG was discontinued after negative for 24 hours.  Recommendations  - Completed IVMP 1G Q24 hours x 5 doses and IVIG x 5 doses - I discussed rituximab as a potential option with patient's mother an aunt and discussed risks and benefits including risks for oportunistic infections and potential allergic reactions for which will premedicate her. Small chance that this might have an effect on her fertility but  cyclophosphamide is much more likely to cause issues with fertility. - Quantitative immunoglobulin levels, Quantiferon, JC virus, SPEP ordered and pending. - Hb core Ab is positive. Hep B DNA pending. - family concerned about potential carrier for typhoid. GI panel ordered by team to evaluate for Salmonella typhi. Will defer to primary team and ID. - Discontinued LTM, no seizures over the last 24 hours. - Continue Keppra 1G Q12 hours. - Family wants Transabdominal US prior to getting Transvaginal US. It is not very sensitive for Ovarian teratoma however. - Plan is to give Rituximab 1000mg  IV once tomorrow pending studies above with pretreatment per protocol. Will probably also need repeat dosing again in 14 days. _____________________________________________________________________   Plan discussed with patient's mother at bedside and with primary team.  Thank you for the opportunity to take part  in the care of this patient. If you have any further questions, please contact the neurology consultation attending.  Signed,  [place] Pager Number [phone]

## 2021-02-18 ENCOUNTER — Inpatient Hospital Stay (HOSPITAL_COMMUNITY): Payer: Medicaid Other

## 2021-02-18 DIAGNOSIS — G0490 Encephalitis and encephalomyelitis, unspecified: Secondary | ICD-10-CM | POA: Diagnosis not present

## 2021-02-18 LAB — CBC WITH DIFFERENTIAL/PLATELET
Abs Immature Granulocytes: 0.06 10*3/uL (ref 0.00–0.07)
Basophils Absolute: 0 10*3/uL (ref 0.0–0.1)
Basophils Relative: 0 %
Eosinophils Absolute: 0 10*3/uL (ref 0.0–0.5)
Eosinophils Relative: 1 %
HCT: 33.6 % — ABNORMAL LOW (ref 36.0–46.0)
Hemoglobin: 10.9 g/dL — ABNORMAL LOW (ref 12.0–15.0)
Immature Granulocytes: 1 %
Lymphocytes Relative: 11 %
Lymphs Abs: 0.9 10*3/uL (ref 0.7–4.0)
MCH: 29.9 pg (ref 26.0–34.0)
MCHC: 32.4 g/dL (ref 30.0–36.0)
MCV: 92.3 fL (ref 80.0–100.0)
Monocytes Absolute: 0.6 10*3/uL (ref 0.1–1.0)
Monocytes Relative: 7 %
Neutro Abs: 6.1 10*3/uL (ref 1.7–7.7)
Neutrophils Relative %: 80 %
Platelets: 239 10*3/uL (ref 150–400)
RBC: 3.64 MIL/uL — ABNORMAL LOW (ref 3.87–5.11)
RDW: 12.7 % (ref 11.5–15.5)
WBC: 7.6 10*3/uL (ref 4.0–10.5)
nRBC: 0 % (ref 0.0–0.2)

## 2021-02-18 LAB — GLUCOSE, CAPILLARY
Glucose-Capillary: 104 mg/dL — ABNORMAL HIGH (ref 70–99)
Glucose-Capillary: 105 mg/dL — ABNORMAL HIGH (ref 70–99)
Glucose-Capillary: 110 mg/dL — ABNORMAL HIGH (ref 70–99)
Glucose-Capillary: 134 mg/dL — ABNORMAL HIGH (ref 70–99)
Glucose-Capillary: 137 mg/dL — ABNORMAL HIGH (ref 70–99)
Glucose-Capillary: 138 mg/dL — ABNORMAL HIGH (ref 70–99)
Glucose-Capillary: 90 mg/dL (ref 70–99)

## 2021-02-18 LAB — PROTEIN ELECTROPHORESIS, SERUM
A/G Ratio: 0.6 — ABNORMAL LOW (ref 0.7–1.7)
Albumin ELP: 2.7 g/dL — ABNORMAL LOW (ref 2.9–4.4)
Alpha-1-Globulin: 0.2 g/dL (ref 0.0–0.4)
Alpha-2-Globulin: 0.8 g/dL (ref 0.4–1.0)
Beta Globulin: 0.7 g/dL (ref 0.7–1.3)
Gamma Globulin: 2.6 g/dL — ABNORMAL HIGH (ref 0.4–1.8)
Globulin, Total: 4.4 g/dL — ABNORMAL HIGH (ref 2.2–3.9)
Total Protein ELP: 7.1 g/dL (ref 6.0–8.5)

## 2021-02-18 LAB — QUANTIFERON-TB GOLD PLUS (RQFGPL)
QuantiFERON Mitogen Value: 0.06 IU/mL
QuantiFERON Nil Value: 0 IU/mL
QuantiFERON TB1 Ag Value: 0 IU/mL
QuantiFERON TB2 Ag Value: 0 IU/mL

## 2021-02-18 LAB — BASIC METABOLIC PANEL
Anion gap: 5 (ref 5–15)
BUN: 18 mg/dL (ref 6–20)
CO2: 25 mmol/L (ref 22–32)
Calcium: 8.7 mg/dL — ABNORMAL LOW (ref 8.9–10.3)
Chloride: 98 mmol/L (ref 98–111)
Creatinine, Ser: 0.6 mg/dL (ref 0.44–1.00)
GFR, Estimated: >60 mL/min (ref >60)
Glucose, Bld: 131 mg/dL — ABNORMAL HIGH (ref 70–99)
Potassium: 3.8 mmol/L (ref 3.5–5.1)
Sodium: 128 mmol/L — ABNORMAL LOW (ref 135–145)

## 2021-02-18 LAB — MAGNESIUM: Magnesium: 2.2 mg/dL (ref 1.7–2.4)

## 2021-02-18 LAB — HEPATITIS B DNA, ULTRAQUANTITATIVE, PCR
HBV DNA SERPL PCR-ACNC: NOT DETECTED IU/mL
HBV DNA SERPL PCR-LOG IU: UNDETERMINED log10 IU/mL

## 2021-02-18 LAB — QUANTIFERON-TB GOLD PLUS: QuantiFERON-TB Gold Plus: UNDETERMINED — AB

## 2021-02-18 MED ORDER — DIPHENHYDRAMINE HCL 25 MG PO CAPS: 50.0000 mg | ORAL_CAPSULE | Freq: Once | ORAL | Status: DC

## 2021-02-18 MED ORDER — SCOPOLAMINE 1 MG/3DAYS TD PT72
1.0000 | MEDICATED_PATCH | TRANSDERMAL | Status: DC
  Administered 2021-02-18 – 2021-03-15 (×11): 1.5 mg via TRANSDERMAL
  Filled 2021-02-18 (×14): qty 1

## 2021-02-18 MED ORDER — ACETAMINOPHEN 325 MG PO TABS: 650.0000 mg | ORAL_TABLET | Freq: Once | ORAL | Status: DC

## 2021-02-18 MED ORDER — DIPHENHYDRAMINE HCL 50 MG/ML IJ SOLN: 50.0000 mg | Freq: Once | INTRAMUSCULAR | Status: DC | PRN

## 2021-02-18 MED ORDER — METHYLPREDNISOLONE SODIUM SUCC 125 MG IJ SOLR: 125.0000 mg | Freq: Once | INTRAMUSCULAR | Status: DC | PRN

## 2021-02-18 MED ORDER — SODIUM CHLORIDE 0.9 % IV SOLN
1000.0000 mg | Freq: Once | INTRAVENOUS | Status: DC
  Filled 2021-02-18 (×2): qty 100

## 2021-02-18 MED ORDER — LEVOTHYROXINE SODIUM 100 MCG PO TABS
100.0000 ug | ORAL_TABLET | Freq: Every day | ORAL | Status: DC
  Administered 2021-02-18: 100 ug via ORAL
  Filled 2021-02-18 (×2): qty 1

## 2021-02-18 MED ORDER — EPINEPHRINE PF 1 MG/ML IJ SOLN: 0.3000 mg | INTRAMUSCULAR | Status: DC | PRN

## 2021-02-18 MED ORDER — GADOBUTROL 1 MMOL/ML IV SOLN
6.0000 mL | Freq: Once | INTRAVENOUS | Status: AC | PRN
  Administered 2021-02-18: 6 mL via INTRAVENOUS

## 2021-02-18 MED ORDER — LACTATED RINGERS IV SOLN: INTRAVENOUS | Status: AC

## 2021-02-18 MED ORDER — ALBUTEROL SULFATE (2.5 MG/3ML) 0.083% IN NEBU: 2.5000 mg | INHALATION_SOLUTION | Freq: Once | RESPIRATORY_TRACT | Status: DC | PRN

## 2021-02-18 MED ORDER — ACETAMINOPHEN 650 MG RE SUPP
650.0000 mg | Freq: Four times a day (QID) | RECTAL | Status: DC | PRN
  Administered 2021-02-18 – 2021-02-25 (×2): 650 mg via RECTAL
  Filled 2021-02-18 (×2): qty 1

## 2021-02-18 MED ORDER — FAMOTIDINE IN NACL 20-0.9 MG/50ML-% IV SOLN
20.0000 mg | Freq: Once | INTRAVENOUS | Status: DC | PRN
  Filled 2021-02-18: qty 50

## 2021-02-18 MED ORDER — SODIUM CHLORIDE 0.9 % IV BOLUS: 1000.0000 mL | Freq: Once | INTRAVENOUS | Status: DC | PRN

## 2021-02-18 MED ORDER — OXYMETAZOLINE HCL 0.05 % NA SOLN
1.0000 | Freq: Two times a day (BID) | NASAL | Status: DC
  Administered 2021-02-18 (×2): 1 via NASAL
  Filled 2021-02-18: qty 30

## 2021-02-18 MED ORDER — ACETAMINOPHEN 160 MG/5ML PO SOLN: 650.0000 mg | Freq: Four times a day (QID) | ORAL | Status: DC | PRN

## 2021-02-18 NOTE — Progress Notes (Signed)
After thorough discussion with patient's mother concerning risks and benefits of treating with rituximab and in the setting of patient's recent fevers, she would like to start rituximab treatment.  Unclear etiology for her fevers at this time, we will continue to monitor white count daily.

## 2021-02-18 NOTE — Significant Event (Signed)
Rapid Response Event Note   Reason for Call :  Nosebleed from left nare   Initial Focused Assessment:  Pt in bed, HOB >45 degrees. She has increased work of breathing and is unable to clear post-nasal and upper airway secretions. Pt with clots and BRB expelling from left nare. Lung sounds are rhonchus throughout. Gag reflex present, moderate cough.  VS: T 100.54F, BP 109/73, HR 100, RR 20, SpO2 98% on 2LNC  Interventions:  -Afrin nasal spray administered -Small nasal packing device placed -NTS via right nare -Veni mask 5L/40% applied -CXR  Plan of Care:  -Maintain nasal packing for at least 24 hours, remove only with physician order. Moisten packing upon removal.  -Wean oxygen as pt tolerates -HOB >30 -Per family request, do not lay pt flat for turns -Frequent oral suctioning, oral care  Call rapid response for additional needs  Event Summary:  MD Notified: Dr. Johnney Ou at bedside Call Time: [place] Time: 1200 End Time: [place], RN

## 2021-02-18 NOTE — Progress Notes (Signed)
NEUROLOGY CONSULTATION PROGRESS NOTE   Date of service: February 18, 2021 Patient Name: Jamie [name] MRN:  [id number] DOB:  12-30-2000  Brief HPI  Jamie [name] is a 20 y.o. 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 intake for 4 days and decreased verbalization 10/15 prior to arrival. Initial work-up revealed a mildly elevated creatinine, TSH of 10.09, negative ETOH, and an abnormally elevated thyroperoxidase Ab of 268.  Extensive workup with MRI Brain normal, LP with lymphocytic pleocytosis, mildly elevated protein, elevated IgG index with negative cultures, negative HSV, VZV.  Her NMDA Receptor IgG elevated to > 1:2560. 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  She is s/p 5 days of IV Solumedrol and is to get her 4th dose of IVIG today.   Interval Hx   Minimal movement, no change in mentation. Coughing out secretions in her mouth.  Vitals   Vitals:   02/17/21 2000 02/18/21 0000 02/18/21 0400 02/18/21 0755  BP: 109/78 91/67 117/80 104/73  Pulse: 93 94 94 84  Resp: 18 19  18   Temp: 98.9 F (37.2 C) 99.1 F (37.3 C) (!) 100.9 F (38.3 C) 99.3 F (37.4 C)  TempSrc: Oral Oral Oral Oral  SpO2: 98% 98% 99% 98%  Weight:         Body mass index is 23.73 kg/m.  Physical Exam   General: Laying comfortably in bed; somnolent. HENT: Normal oropharynx and mucosa. Normal external appearance of ears and nose.  Neck: Supple, no pain or tenderness  CV: No JVD. No peripheral edema.  Pulmonary: Symmetric Chest rise. Normal respiratory effort.  Abdomen: Soft to touch, non-tender.  Ext: No cyanosis, edema, or deformity  Skin: No rash. Normal palpation of skin.   Musculoskeletal: Normal digits and nails by inspection. No clubbing.   Neurologic Examination  Mental status/Cognition: eyes closed, no reponse to loud voice or clap. Grimaces to noxious stimuli. Grimaces to nares  stimulation. Speech/language: No speech today, too encephalopathic to be able to assess her speech. Cranial nerves:   CN II Pupils equal and reactive to light, unable to assess for visual fied defects 2/2 eyes closed.   CN III,IV,VI EOMI intact to dolls eyes   CN V Corneals intact BL   CN VII no asymmetry, no nasolabial fold flattening   CN VIII No response to speech   CN IX & X Spontaneously coughing. still seems to be protecting her airway so far but has pooled secretions that she is coughing out.   CN XI    CN XII    Motor:  Muscle bulk: poor, tone flaccid in all extremities. Minimal movement today. No response to pinch.  Reflexes:  Right Left Comments  Pectoralis      Biceps (C5/6) 1 1   Brachioradialis (C5/6) 1 1    Triceps (C6/7) 1 1    Patellar (L3/4) 1 1    Achilles (S1)      Hoffman      Plantar mute mute   Jaw jerk    Sensation: No grimace today to pain.  Coordination/Complex Motor:  Unable to assess coordination.  Labs   Basic Metabolic Panel:  Lab Results  Component Value Date   NA 128 (L) 02/18/2021   K 3.8 02/18/2021   CO2 25 02/18/2021   GLUCOSE 131 (H) 02/18/2021   BUN 18 02/18/2021   CREATININE 0.60 02/18/2021   CALCIUM 8.7 (L) 02/18/2021   GFRNONAA >60 02/18/2021  HbA1c: No results found for: HGBA1C LDL:  Lab Results  Component Value Date   LDLCALC 62 02/09/2021   Urine Drug Screen:     Component Value Date/Time   LABOPIA NONE DETECTED 02/07/2021 0600   COCAINSCRNUR NONE DETECTED 02/07/2021 0600   LABBENZ NONE DETECTED 02/07/2021 0600   AMPHETMU NONE DETECTED 02/07/2021 0600   THCU NONE DETECTED 02/07/2021 0600   LABBARB NONE DETECTED 02/07/2021 0600    Alcohol Level     Component Value Date/Time   ETH <10 02/06/2021 1435   No results found for: PHENYTOIN, ZONISAMIDE, LAMOTRIGINE, LEVETIRACETA No results found for: PHENYTOIN, PHENOBARB, VALPROATE, CBMZ  Imaging and Diagnostic studies   MR BRAIN W/WO CONTRAST Officialy read  as normal MRI of brain but there is some concern for subtle enhancement of the posterior colliculi on axial post contrast imaging.  N-methyl-D-Aspartate Recpt.IgG >1:2560   CSF IgG index: 1.4  Oligoclonal Bands: Three (3) oligoclonal bands were observed in the CSF, which were not detected in the serum sample.   CSF WBC: 318, 94% lymphocytes.  CSF Protein: 50  CSF Glucose: 68.  CSF culture with gram stain: PMN seen but no organisms seen on smear, no growth so far. CSF Fungal Cx: NO FUNGUS ISOLATED AFTER 7 DAYS.  Serum HIV 1/2: non reactive.  CSF Cryptococcus Ag: negative  CMV CSF: negative  HSV 1/2 PCR CSF: Negative  VZV PCR CSF: negative.   Impression   Jamie [name] is a 20 y.o. 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 intake for 4 days and decreased verbalization 10/15 prior to arrival. Initial concerns for Hashimoto's encephalitis but extensive workup with CSF pleocytosis, positive NMDA receptor IgG, elevated IgG index and 3 oligoclonal bands in the CSF that are not noted in the serum. 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 She completed 5 days of high dose IV Solumedrol and completed IVIG x 5 doses.  Of note, did have R frontal seizure on cEEG on 10/25, has not had any since she has been on Keppra 500mg  BID. EEG was discontinued after negative for 24 hours.  Recommendations  - Completed IVMP 1G Q24 hours x 5 doses and IVIG x 5 doses - I discussed rituximab as a potential option with patient's mother an aunt and discussed risks and benefits including risks for oportunistic infections and potential allergic reactions for which will premedicate her. Small chance that this might have an effect on her fertility but cyclophosphamide is much more likely to  cause issues with fertility. - Quantitative immunoglobulin levels, Quantiferon, JC virus, SPEP ordered and pending. - Hb core Ab is positive. Hep B DNA pending. - family concerned about potential carrier for typhoid. GI panel ordered by team to evaluate for Salmonella typhi. Will defer to primary team and ID. - Discontinued LTM, no seizures over the last 24 hours. - Continue Keppra 1G Q12 hours. - MR Pelvis w + w/o C. Her R ovary is high riding and spoke to rads and they think that MR Pelvis will be better to evaluate for an ovarian teratoma. - Plan is to give Rituximab 1000mg  IV with pretreatment per protocol. This was held yesterday as she developed a fever and cultures and workup is pending. Will probably also need repeat dosing again in 14 days. _____________________________________________________________________   Plan discussed with patient's mother  at bedside and with primary team.  Thank you for the opportunity to take part in the care of this patient. If you have any further questions, please contact the neurology consultation attending.  Signed,  [place] Pager Number [phone]

## 2021-02-18 NOTE — Progress Notes (Signed)
PT DEVELOPED NOSEBLEED FROM LEFT NARE, WHERE CORTRAK IS LOCATED. AIRWAY NOT COMPROMISED/OBSTRUCTED. SATS REMAIN ABOVE 90 ON R/A. SUCTIONED FREQUENTLY, IMTS NOTIFIED OF SITUATION AND RAPID RESPONSE CALLED. ARRIVED TO ROOM TO ASSESS PT AT WHICH TIME BLEEDING REMAINED PROFUSE. TUBE FEEDING PLACED ON HOLD AT THIS TIME. AFRIN ADMINISTERED AND PACKING PLACED IN NARE. CORTRAK REMOVED WITHOUT COMPLICATION. PT REMAINS IN ROOM AT THIS TIME. VENTURI MASK IN PLACE, SUCTIONED FREQUENTLY. NO FURTHER S/O FRANK BLEEDING OBVIOUS.

## 2021-02-18 NOTE — Progress Notes (Signed)
RT called about nose bleed and potential compromised airway. NTS down right nare but unable to pass. Blood obtained from nose. Patient very rhonchorous in airway. Xray at bedside.

## 2021-02-18 NOTE — Progress Notes (Signed)
[facility] consult for administration of Rituxan. Pharmacy was secured chatted at 1650, a follow up phone call was given at 1730, and a third conversation with pharmacy was given at [place]. At this time the two IV team, chemo certified nurses have been made aware of the [facility] consult. Pharmacy and the bedside RN have been informed of this and the [facility] will reach out again in the AM to follow up for time to administer Rituxan.

## 2021-02-18 NOTE — Progress Notes (Signed)
Order for Rituximab noted, discussed with Rod Holler, Pharmacist. There is no chemo/immunotherapy trained [facility] RN to administer Rituximab tonight. Requested message to be sent to inpatient chemo distribution list so coverage can be arranged for 10/28.

## 2021-02-18 NOTE — Progress Notes (Signed)
Paged by nursing staff that patient was having nosebleed with large amounts of blood.  Requested nursing staff fully sit up bed and apply pressure to nares.  At bedside, patient with bright red blood from left nares.  Patient had similar episode 2 days ago and NG tube was removed at that time.  It was thought that bleeding was secondary to patient pulling at NG tube.  Compression applied, rapid response and Dr. Charleen Kirks of IMTS at bedside.  Bleeding stopped with compression however patient having audible rhonchus sounds with moderate cough. Afrin nasal spray applied to left nare and nasal packing placed. NTS via right nare. NRB placed and patient saturating well. Chest xray reviewed at bedside and without active disease. Will continue to monitor closely, cbc tomorrow morning, and leave packing in place.

## 2021-02-18 NOTE — Progress Notes (Signed)
Paged by RN that patient was noted to be tremulous in her upper extremities with her eyes rolled back in her head. Evaluated the patient at the bedside and she was not actively tremulous at that time nor was she actively convulsing, thus low suspicion for seizure. Her eyes were rolled back in her head, however, her pupils were reactive to light. She did also have some facial grimacing noted, specifically with her mouth that appeared to be twitching, but this was noted on exam in the morning by the day team, as well. The patient's mother was specifically requesting a 24/7 sitter with the patient, however, I explained to the mother that the patient is protecting her airway without any secretions noted, her vital signs are all stable, and she is not actively seizing at this time. She was more comfortable without a sitter after our discussion and I emphasized that if she has any further concerns we can reevaluate the patient.   Physical exam: V:  HR 90s, 118/76, SpO2 97% on RA Constitutional: No acute distress, lying in bed Eyes: PERRL, rolling back in head Neuro: Not arousable and not able to follow commands, spontaneous facial grimacing.   Plan: - Continue to monitor for signs of seizure like activity   Buddy Duty, DO PGY-1, Internal Medicine Resident [phone]

## 2021-02-18 NOTE — Progress Notes (Addendum)
HD 11 Subjective:  Overnight Events: Bilateral upper extremity tremulousness, with eyes rolled back, and facial grimacing noted.  Pupillary reflex intact.  Patient was seen this AM with mom at bedside and family friend on phone. She is laying in bed at an incline, her eyes are half open, intermittent grunting, some sialorrhea   Patient is resting in bed comfortably at incline, with eyes closed, intermittent moaning, some low volume gurgling noises, minimal spontaneous movements and flaccid tone.   Per mom, no changes since yesterday.  Spoke to mom and family friend who is an Therapist, sports to help with translation. Updated family about hospitalization and discussed transvaginal U/S and the family's concern in regards to the culture. Mom gave consent to transvaginal U/S and requested a document stating that the ultrasound was medically necessary. Per mom, patient had typhoid fever in the summer 2021, received and completed treatment in [place], and has had no symptoms since returning to the [place]. Mom does not recall the name of the medicine.  Objective:  Vital signs in last 24 hours: Vitals:   02/18/21 0000 02/18/21 0400 02/18/21 0755 02/18/21 1200  BP: 91/67 117/80 104/73 (P) 109/73  Pulse: 94 94 84 (P) 100  Resp: 19  18 (P) 20  Temp: 99.1 F (37.3 C) (!) 100.9 F (38.3 C) 99.3 F (37.4 C) (P) 100.1 F (37.8 C)  TempSrc: Oral Oral Oral (P) Oral  SpO2: 98% 99% 98% (P) 98%  Weight:       Supplemental O2:  SpO2: 98 % O2 Device: Room Air O2 Flow Rate (L/min): 2 L/min   Physical Exam:  Physical Exam Vitals and nursing note reviewed.  Constitutional:      General: She is not in acute distress.    Appearance: She is ill-appearing and diaphoretic.     Comments: Comatose, minimal response to pain   HENT:     Head: Normocephalic and atraumatic.     Nose:     Comments: Minimal dry blood noted on nares    Mouth/Throat:     Mouth: Mucous membranes are moist.     Comments: Some sialorrhea present   Eyes:     Pupils: Pupils are equal, round, and reactive to light.     Comments: Pupillary reflex intact  Cardiovascular:     Rate and Rhythm: Regular rhythm. Tachycardia present.     Heart sounds: Normal heart sounds.  Pulmonary:     Effort: Pulmonary effort is normal. No respiratory distress.     Breath sounds: No wheezing.     Comments: Transmitted gurgling sounds from upper respiratory Abdominal:     Palpations: Abdomen is soft.  Musculoskeletal:     Cervical back: Neck supple.     Right lower leg: No edema.     Left lower leg: No edema.  Skin:    General: Skin is warm.     Findings: Rash present.     Comments: Hyperpigmented rash still present on b/l thighs and down left shin, unchanged   Neurological:     Comments: Decreased tone and upper and lower extremities bilaterally.  No tremors, cogwheel rigidity, contractures noted.    Filed Weights   02/12/21 1409  Weight: 58.9 kg    Intake/Output Summary (Last 24 hours) at 02/18/2021 1345 Last data filed at 02/18/2021 1300 Gross per 24 hour  Intake --  Output 2000 ml  Net -2000 ml   Net IO Since Admission: -3,372.17 mL [02/18/21 1345]  Pertinent Labs: Results for orders placed or performed  during the hospital encounter of 02/06/21 (from the past 24 hour(s))  Glucose, capillary     Status: None   Collection Time: 02/17/21  4:24 PM  Result Value Ref Range   Glucose-Capillary 90 70 - 99 mg/dL  Culture, blood (routine x 2)     Status: None (Preliminary result)   Collection Time: 02/17/21  8:45 PM   Specimen: BLOOD RIGHT HAND  Result Value Ref Range   Specimen Description BLOOD RIGHT HAND    Special Requests      BOTTLES DRAWN AEROBIC AND ANAEROBIC Blood Culture adequate volume   Culture      NO GROWTH < 12 HOURS Performed at [facility] [address] [place], [place] [address]    Report Status PENDING   Culture, blood (routine x 2)     Status: None (Preliminary result)   Collection Time: 02/17/21  8:45  PM   Specimen: BLOOD LEFT HAND  Result Value Ref Range   Specimen Description BLOOD LEFT HAND    Special Requests      BOTTLES DRAWN AEROBIC AND ANAEROBIC Blood Culture adequate volume   Culture      NO GROWTH < 12 HOURS Performed at [facility], [place] [address], [place], [place] [address]    Report Status PENDING   Glucose, capillary     Status: Abnormal   Collection Time: 02/17/21  9:16 PM  Result Value Ref Range   Glucose-Capillary 122 (H) 70 - 99 mg/dL   Comment 1 Notify RN    Comment 2 Document in Chart   Glucose, capillary     Status: Abnormal   Collection Time: 02/18/21 12:45 AM  Result Value Ref Range   Glucose-Capillary 134 (H) 70 - 99 mg/dL   Comment 1 Notify RN    Comment 2 Document in Chart   CBC with Differential/Platelet     Status: Abnormal   Collection Time: 02/18/21  2:46 AM  Result Value Ref Range   WBC 7.6 4.0 - 10.5 K/uL   RBC 3.64 (L) 3.87 - 5.11 MIL/uL   Hemoglobin 10.9 (L) 12.0 - 15.0 g/dL   HCT 33.6 (L) 36.0 - 46.0 %   MCV 92.3 80.0 - 100.0 fL   MCH 29.9 26.0 - 34.0 pg   MCHC 32.4 30.0 - 36.0 g/dL   RDW 12.7 11.5 - 15.5 %   Platelets 239 150 - 400 K/uL   nRBC 0.0 0.0 - 0.2 %   Neutrophils Relative % 80 %   Neutro Abs 6.1 1.7 - 7.7 K/uL   Lymphocytes Relative 11 %   Lymphs Abs 0.9 0.7 - 4.0 K/uL   Monocytes Relative 7 %   Monocytes Absolute 0.6 0.1 - 1.0 K/uL   Eosinophils Relative 1 %   Eosinophils Absolute 0.0 0.0 - 0.5 K/uL   Basophils Relative 0 %   Basophils Absolute 0.0 0.0 - 0.1 K/uL   Immature Granulocytes 1 %   Abs Immature Granulocytes 0.06 0.00 - 0.07 K/uL  Basic metabolic panel     Status: Abnormal   Collection Time: 02/18/21  2:46 AM  Result Value Ref Range   Sodium 128 (L) 135 - 145 mmol/L   Potassium 3.8 3.5 - 5.1 mmol/L   Chloride 98 98 - 111 mmol/L   CO2 25 22 - 32 mmol/L   Glucose, Bld 131 (H) 70 - 99 mg/dL   BUN 18 6 - 20 mg/dL   Creatinine, Ser 0.60 0.44 - 1.00 mg/dL  Calcium 8.7 (L) 8.9 - 10.3 mg/dL   GFR,  Estimated >60 >60 mL/min   Anion gap 5 5 - 15  Magnesium     Status: None   Collection Time: 02/18/21  2:46 AM  Result Value Ref Range   Magnesium 2.2 1.7 - 2.4 mg/dL  Glucose, capillary     Status: Abnormal   Collection Time: 02/18/21  4:00 AM  Result Value Ref Range   Glucose-Capillary 138 (H) 70 - 99 mg/dL   Comment 1 Notify RN    Comment 2 Document in Chart   Glucose, capillary     Status: None   Collection Time: 02/18/21  7:53 AM  Result Value Ref Range   Glucose-Capillary 90 70 - 99 mg/dL  Glucose, capillary     Status: Abnormal   Collection Time: 02/18/21 11:37 AM  Result Value Ref Range   Glucose-Capillary 137 (H) 70 - 99 mg/dL     Imaging: DG Chest 1 View  Result Date: 02/17/2021 CLINICAL DATA:  Fever. EXAM: CHEST  1 VIEW COMPARISON:  Chest x-ray 02/16/2021. FINDINGS: Enteric tube extends below the diaphragm. Distal tip is not included on the image. There is no focal lung infiltrate, pleural effusion or pneumothorax. The cardiomediastinal silhouette is within normal limits for projection. No acute fractures are seen. IMPRESSION: No active disease. Electronically Signed   By: Ronney Asters M.D.   On: 02/17/2021 20:44   US PELVIS (TRANSABDOMINAL ONLY)  Result Date: 02/17/2021 CLINICAL DATA:  Ovarian mass/teratoma. EXAM: TRANSABDOMINAL ULTRASOUND OF PELVIS TECHNIQUE: Transabdominal ultrasound examination of the pelvis was performed including evaluation of the uterus, ovaries, adnexal regions, and pelvic cul-de-sac. COMPARISON:  CT chest abdomen and pelvis 02/13/2021. FINDINGS: Uterus Measurements: 5.5 x 3.5 x 3.3 cm = volume: 33 mL. No fibroids or other mass visualized. Endometrium Thickness: 8 mm.  No focal abnormality visualized. Right ovary Measurements: Not seen.  No adnexal masses are identified. Left ovary Measurements: 2.8 x 1.7 x 2.1 cm = volume: 5 mL. Normal appearance/no adnexal mass. Other findings: There is a trace amount of free fluid in the pelvis. There is a Foley  catheter in the bladder. IMPRESSION: 1. Right ovary not visualized. 2. Normal appearance of left ovary and uterus. 3. Trace free fluid in the pelvis. Electronically Signed   By: Ronney Asters M.D.   On: 02/17/2021 22:32   DG Abd Portable 1V  Result Date: 02/17/2021 CLINICAL DATA:  Feeding tube EXAM: PORTABLE ABDOMEN - 1 VIEW COMPARISON:  02/12/2021 FINDINGS: Esophageal tube tip overlies the distal stomach. Visible gas pattern unremarkable. IMPRESSION: Esophageal tube tip overlies the distal stomach Electronically Signed   By: Donavan Foil M.D.   On: 02/17/2021 17:28    Assessment/Plan:  Principal Problem:   Encephalitis Active Problems:   Primary hypothyroidism   Catatonia associated with another mental disorder  Patient Summary: Jamie [name] is a 20 y.o. 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  MRI Head with and without contrast negative for acute pathology.  Qualitative HSV, VZV, EBV, CMV in CSF negative. RPR negative. HepC Ab nonreactive. Cryptococcal antigen negative.  Anti-NMDA Ig positive, will R/O ovarian teratoma with transvaginal ultrasound (10/25).  ANA and Ro Ig positive, will have patient follow up outpatient. Quantitative immunoglobulin levels, Quantiferon, JC virus, SPEP ordered and pending. HepB sAg nonreactive and HepB cAb positive, which  is consistent with immunity against hepatitis B. HBV DNA pending. Neurology will start rituximab if HBV DNA is negative, currently pending. On transabdominal ultrasound right ovary was not visualized, left ovary was normal, trace free fluid in pelvis.  Because patient has never been sexually active, deferred transvaginal ultrasound. Will have pelvic MRI instead.  Prior to decision for MRI, mom gave consent for  transvaginal ultrasound. Neurology, ID consulted, pastoral appreciate assistance and recommendations. Continue telemetry Treatment per neurology Follow-up pelvic MRI  #Fever T-max of 101.89F (10/26).  Patient has an indwelling Foley catheter in, will do a voiding trial. Follow-up UA Follow-up blood culture x2 Follow-up voiding trial  #Focal seizure Evident on last overnight EEG (10/25), which showed focal seizure in right frontal central and generalized rhythmic delta activity (GRDA) She was witnessed having seizure-like activities x2 on night of 10/24. Received Keppra loading dose (10/24). Pseudo-hyponatremia 2/2 IVIG treatment.  No seizure activity since starting Keppra.  We will continue to monitor Mg2+ >2, K+>4. Replete as necessary Continue IV Keppra 1000 mg BID  #Iso-osmolar pseudo-hyponatremia  2/2 IVIG treatment. Will continue to monitor. Trend sodium  #Malnutrition Secondary to encephalopathy and lack of p.o. intake. Core track tube placed 02/12/21, removed 10/24, and replaced 10/26.  This was confirmed placement with CXR. Magnesium and phosphate stable. We will continue to monitor. Appreciate dietitian assistance and recommendation Feeding supplement per dietitian Daily Mg 2+ and weekly phosphate for possible refeeding syndrome Continue thiamine daily Free water and feeding supplement daily  #Urinary Retention While patient periodically voided large amounts on her own, 2+ bladder scans with over 200cc's of urine. Foley catheter placed. 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 Can chronically carry for over a 1 year. We will obtain further history from mom in regards to treatment,  symptoms, timeline. Will contact Health Department prior to D/C Follow-up GI panel for salmonella hyphae  Diet: Feeding supplement per RD Continuous: feeding supplement (OSMOLITE 1.2 CAL), Last Rate: Stopped (02/14/21 0910) levETIRAcetam, Last Rate: 1,000 mg (02/18/21 0955) VTE: enoxaparin (LOVENOX) injection 40 mg Start: 02/17/21 2200 Place and maintain sequential compression device Start: 02/08/21 0847  Code: Full Code  PT/OT recs:  Pending Family Update: Family at bedside  Dispo: Anticipated discharge pending further workup   Merrily Brittle, DO Psychiatry Resident, PGY-1 [facility] Pager: [phone]  Please contact the on call pager after 5 pm and on weekends at [phone].

## 2021-02-19 ENCOUNTER — Inpatient Hospital Stay (HOSPITAL_COMMUNITY): Payer: Medicaid Other

## 2021-02-19 DIAGNOSIS — G0490 Encephalitis and encephalomyelitis, unspecified: Secondary | ICD-10-CM | POA: Diagnosis not present

## 2021-02-19 LAB — MRSA NEXT GEN BY PCR, NASAL: MRSA by PCR Next Gen: NOT DETECTED

## 2021-02-19 LAB — CBC WITH DIFFERENTIAL/PLATELET
Abs Immature Granulocytes: 0.06 10*3/uL (ref 0.00–0.07)
Basophils Absolute: 0 10*3/uL (ref 0.0–0.1)
Basophils Relative: 0 %
Eosinophils Absolute: 0 10*3/uL (ref 0.0–0.5)
Eosinophils Relative: 0 %
HCT: 33.6 % — ABNORMAL LOW (ref 36.0–46.0)
Hemoglobin: 10.8 g/dL — ABNORMAL LOW (ref 12.0–15.0)
Immature Granulocytes: 1 %
Lymphocytes Relative: 11 %
Lymphs Abs: 0.8 10*3/uL (ref 0.7–4.0)
MCH: 29.9 pg (ref 26.0–34.0)
MCHC: 32.1 g/dL (ref 30.0–36.0)
MCV: 93.1 fL (ref 80.0–100.0)
Monocytes Absolute: 0.6 10*3/uL (ref 0.1–1.0)
Monocytes Relative: 8 %
Neutro Abs: 6.1 10*3/uL (ref 1.7–7.7)
Neutrophils Relative %: 80 %
Platelets: 268 10*3/uL (ref 150–400)
RBC: 3.61 MIL/uL — ABNORMAL LOW (ref 3.87–5.11)
RDW: 13 % (ref 11.5–15.5)
WBC: 7.6 10*3/uL (ref 4.0–10.5)
nRBC: 0 % (ref 0.0–0.2)

## 2021-02-19 LAB — BASIC METABOLIC PANEL
Anion gap: 6 (ref 5–15)
BUN: 20 mg/dL (ref 6–20)
CO2: 26 mmol/L (ref 22–32)
Calcium: 8.9 mg/dL (ref 8.9–10.3)
Chloride: 100 mmol/L (ref 98–111)
Creatinine, Ser: 0.59 mg/dL (ref 0.44–1.00)
GFR, Estimated: >60 mL/min (ref >60)
Glucose, Bld: 101 mg/dL — ABNORMAL HIGH (ref 70–99)
Potassium: 3.8 mmol/L (ref 3.5–5.1)
Sodium: 132 mmol/L — ABNORMAL LOW (ref 135–145)

## 2021-02-19 LAB — POCT I-STAT 7, (LYTES, BLD GAS, ICA,H+H)
Acid-Base Excess: 6 mmol/L — ABNORMAL HIGH (ref 0.0–2.0)
Bicarbonate: 29.6 mmol/L — ABNORMAL HIGH (ref 20.0–28.0)
Calcium, Ion: 1.23 mmol/L (ref 1.15–1.40)
HCT: 32 % — ABNORMAL LOW (ref 36.0–46.0)
Hemoglobin: 10.9 g/dL — ABNORMAL LOW (ref 12.0–15.0)
O2 Saturation: 100 %
Patient temperature: 99.3
Potassium: 3.4 mmol/L — ABNORMAL LOW (ref 3.5–5.1)
Sodium: 141 mmol/L (ref 135–145)
TCO2: 31 mmol/L (ref 22–32)
pCO2 arterial: 37.7 mmHg (ref 32.0–48.0)
pH, Arterial: 7.505 — ABNORMAL HIGH (ref 7.350–7.450)
pO2, Arterial: 280 mmHg — ABNORMAL HIGH (ref 83.0–108.0)

## 2021-02-19 LAB — GLUCOSE, CAPILLARY
Glucose-Capillary: 98 mg/dL (ref 70–99)
Glucose-Capillary: 96 mg/dL (ref 70–99)
Glucose-Capillary: 94 mg/dL (ref 70–99)
Glucose-Capillary: 97 mg/dL (ref 70–99)
Glucose-Capillary: 88 mg/dL (ref 70–99)
Glucose-Capillary: 97 mg/dL (ref 70–99)

## 2021-02-19 LAB — MAGNESIUM: Magnesium: 2.3 mg/dL (ref 1.7–2.4)

## 2021-02-19 LAB — PHOSPHORUS: Phosphorus: 3.5 mg/dL (ref 2.5–4.6)

## 2021-02-19 MED ORDER — ACETAMINOPHEN 650 MG RE SUPP: 650.0000 mg | RECTAL | Status: DC | PRN

## 2021-02-19 MED ORDER — LORAZEPAM 2 MG/ML IJ SOLN
2.0000 mg | Freq: Once | INTRAMUSCULAR | Status: AC
  Administered 2021-02-19: 2 mg via INTRAVENOUS

## 2021-02-19 MED ORDER — METHYLPREDNISOLONE SODIUM SUCC 125 MG IJ SOLR
125.0000 mg | Freq: Once | INTRAMUSCULAR | Status: DC | PRN
  Filled 2021-02-19 (×2): qty 2

## 2021-02-19 MED ORDER — DIPHENHYDRAMINE HCL 50 MG/ML IJ SOLN
50.0000 mg | Freq: Once | INTRAMUSCULAR | Status: DC | PRN
  Filled 2021-02-19 (×2): qty 1

## 2021-02-19 MED ORDER — LORAZEPAM 2 MG/ML IJ SOLN
2.0000 mg | Freq: Once | INTRAMUSCULAR | Status: AC
  Administered 2021-02-19: 2 mg via INTRAVENOUS
  Filled 2021-02-19: qty 1

## 2021-02-19 MED ORDER — FENTANYL CITRATE PF 50 MCG/ML IJ SOSY
50.0000 ug | PREFILLED_SYRINGE | INTRAMUSCULAR | Status: DC | PRN
  Administered 2021-02-19: 50 ug via INTRAVENOUS
  Filled 2021-02-19: qty 1

## 2021-02-19 MED ORDER — DIVALPROEX SODIUM 125 MG PO CSDR: 500.0000 mg | DELAYED_RELEASE_CAPSULE | Freq: Three times a day (TID) | ORAL | Status: DC

## 2021-02-19 MED ORDER — ACETAMINOPHEN 10 MG/ML IV SOLN
650.0000 mg | Freq: Once | INTRAVENOUS | Status: DC
  Filled 2021-02-19: qty 65

## 2021-02-19 MED ORDER — ACETAMINOPHEN 10 MG/ML IV SOLN
650.0000 mg | Freq: Four times a day (QID) | INTRAVENOUS | Status: DC
  Filled 2021-02-19 (×2): qty 65

## 2021-02-19 MED ORDER — ORAL CARE MOUTH RINSE
15.0000 mL | OROMUCOSAL | Status: DC
  Administered 2021-02-19 – 2021-03-16 (×248): 15 mL via OROMUCOSAL

## 2021-02-19 MED ORDER — LORAZEPAM 2 MG/ML IJ SOLN
2.0000 mg | INTRAMUSCULAR | Status: AC
  Administered 2021-02-19: 2 mg via INTRAVENOUS
  Filled 2021-02-19: qty 1

## 2021-02-19 MED ORDER — ALBUTEROL SULFATE (2.5 MG/3ML) 0.083% IN NEBU
2.5000 mg | INHALATION_SOLUTION | Freq: Once | RESPIRATORY_TRACT | Status: DC | PRN
  Filled 2021-02-19: qty 3

## 2021-02-19 MED ORDER — ACETAMINOPHEN 325 MG PO TABS: 650.0000 mg | ORAL_TABLET | Freq: Once | ORAL | Status: DC

## 2021-02-19 MED ORDER — PROPOFOL 1000 MG/100ML IV EMUL
0.0000 ug/kg/min | INTRAVENOUS | Status: DC
Start: 2021-02-19 — End: 2021-02-24
  Administered 2021-02-19: 5 ug/kg/min via INTRAVENOUS
  Administered 2021-02-20 – 2021-02-23 (×10): 40 ug/kg/min via INTRAVENOUS
  Administered 2021-02-23: 50 ug/kg/min via INTRAVENOUS
  Administered 2021-02-23: 40 ug/kg/min via INTRAVENOUS
  Administered 2021-02-24 (×2): 50 ug/kg/min via INTRAVENOUS
  Filled 2021-02-19 (×18): qty 100

## 2021-02-19 MED ORDER — PHENYLEPHRINE HCL-NACL 20-0.9 MG/250ML-% IV SOLN
25.0000 ug/min | INTRAVENOUS | Status: DC
  Administered 2021-02-19: 25 ug/min via INTRAVENOUS
  Administered 2021-02-20: 30 ug/min via INTRAVENOUS
  Administered 2021-02-21: 10 ug/min via INTRAVENOUS
  Administered 2021-02-23: 25 ug/min via INTRAVENOUS
  Administered 2021-02-24: 35 ug/min via INTRAVENOUS
  Administered 2021-02-24: 25 ug/min via INTRAVENOUS
  Administered 2021-02-25: 75 ug/min via INTRAVENOUS
  Administered 2021-02-25: 90 ug/min via INTRAVENOUS
  Administered 2021-02-25: 45 ug/min via INTRAVENOUS
  Administered 2021-02-25 – 2021-02-26 (×4): 90 ug/min via INTRAVENOUS
  Filled 2021-02-19 (×14): qty 250

## 2021-02-19 MED ORDER — PANTOPRAZOLE SODIUM 40 MG IV SOLR
40.0000 mg | Freq: Every day | INTRAVENOUS | Status: DC
  Administered 2021-02-19: 40 mg via INTRAVENOUS
  Filled 2021-02-19: qty 40

## 2021-02-19 MED ORDER — VALPROIC ACID 250 MG/5ML PO SOLN
500.0000 mg | Freq: Three times a day (TID) | ORAL | Status: DC
  Administered 2021-02-20 – 2021-03-16 (×74): 500 mg
  Filled 2021-02-19 (×75): qty 10

## 2021-02-19 MED ORDER — FAMOTIDINE IN NACL 20-0.9 MG/50ML-% IV SOLN
20.0000 mg | Freq: Once | INTRAVENOUS | Status: DC | PRN
  Filled 2021-02-19: qty 50

## 2021-02-19 MED ORDER — FENTANYL CITRATE PF 50 MCG/ML IJ SOSY: 50.0000 ug | PREFILLED_SYRINGE | INTRAMUSCULAR | Status: DC | PRN

## 2021-02-19 MED ORDER — ROCURONIUM BROMIDE 10 MG/ML (PF) SYRINGE
PREFILLED_SYRINGE | INTRAVENOUS | Status: AC
  Filled 2021-02-19: qty 10

## 2021-02-19 MED ORDER — DOCUSATE SODIUM 50 MG/5ML PO LIQD
100.0000 mg | Freq: Two times a day (BID) | ORAL | Status: DC
  Administered 2021-02-19 – 2021-02-22 (×6): 100 mg
  Filled 2021-02-19 (×6): qty 10

## 2021-02-19 MED ORDER — EPINEPHRINE PF 1 MG/ML IJ SOLN
0.3000 mg | INTRAMUSCULAR | Status: DC | PRN
  Filled 2021-02-19: qty 1

## 2021-02-19 MED ORDER — LORAZEPAM 2 MG/ML IJ SOLN
INTRAMUSCULAR | Status: AC
  Filled 2021-02-19: qty 1

## 2021-02-19 MED ORDER — MIDAZOLAM HCL 2 MG/2ML IJ SOLN
2.0000 mg | Freq: Once | INTRAMUSCULAR | Status: AC
  Administered 2021-02-19: 2 mg via INTRAVENOUS

## 2021-02-19 MED ORDER — SODIUM CHLORIDE 0.9 % IV SOLN
250.0000 mL | INTRAVENOUS | Status: DC
  Administered 2021-02-19 – 2021-03-16 (×9): 250 mL via INTRAVENOUS

## 2021-02-19 MED ORDER — DIPHENHYDRAMINE HCL 50 MG/ML IJ SOLN: 50.0000 mg | Freq: Once | INTRAMUSCULAR | Status: DC

## 2021-02-19 MED ORDER — LEVOTHYROXINE SODIUM 100 MCG PO TABS
100.0000 ug | ORAL_TABLET | Freq: Every day | ORAL | Status: DC
  Administered 2021-02-20 – 2021-03-16 (×25): 100 ug
  Filled 2021-02-19 (×9): qty 1
  Filled 2021-02-19: qty 2
  Filled 2021-02-19 (×15): qty 1

## 2021-02-19 MED ORDER — ACETAMINOPHEN 10 MG/ML IV SOLN
1000.0000 mg | Freq: Once | INTRAVENOUS | Status: AC
  Administered 2021-02-19: 1000 mg via INTRAVENOUS
  Filled 2021-02-19: qty 100

## 2021-02-19 MED ORDER — ATROPINE SULFATE 1 % OP SOLN
1.0000 [drp] | Freq: Three times a day (TID) | OPHTHALMIC | Status: DC | PRN
  Administered 2021-02-19 – 2021-03-02 (×5): 1 [drp] via SUBLINGUAL
  Filled 2021-02-19: qty 2

## 2021-02-19 MED ORDER — ETOMIDATE 2 MG/ML IV SOLN
20.0000 mg | Freq: Once | INTRAVENOUS | Status: AC
  Administered 2021-02-19: 20 mg via INTRAVENOUS

## 2021-02-19 MED ORDER — LEVETIRACETAM IN NACL 1500 MG/100ML IV SOLN
1500.0000 mg | Freq: Two times a day (BID) | INTRAVENOUS | Status: DC
  Administered 2021-02-19 – 2021-03-16 (×51): 1500 mg via INTRAVENOUS
  Filled 2021-02-19 (×51): qty 100

## 2021-02-19 MED ORDER — SODIUM CHLORIDE 0.9 % IV BOLUS
1000.0000 mL | Freq: Once | INTRAVENOUS | Status: DC | PRN
Start: 2021-02-19 — End: 2021-02-21

## 2021-02-19 MED ORDER — DIPHENHYDRAMINE HCL 50 MG/ML IJ SOLN
50.0000 mg | Freq: Once | INTRAMUSCULAR | Status: AC
  Administered 2021-02-19: 50 mg via INTRAVENOUS
  Filled 2021-02-19: qty 1

## 2021-02-19 MED ORDER — CHLORHEXIDINE GLUCONATE 0.12% ORAL RINSE (MEDLINE KIT)
15.0000 mL | Freq: Two times a day (BID) | OROMUCOSAL | Status: DC
  Administered 2021-02-19 – 2021-03-16 (×51): 15 mL via OROMUCOSAL

## 2021-02-19 MED ORDER — DIPHENHYDRAMINE HCL 25 MG PO CAPS: 50.0000 mg | ORAL_CAPSULE | Freq: Once | ORAL | Status: DC

## 2021-02-19 MED ORDER — VALPROATE SODIUM 100 MG/ML IV SOLN
2400.0000 mg | Freq: Once | INTRAVENOUS | Status: AC
  Administered 2021-02-19: 2400 mg via INTRAVENOUS
  Filled 2021-02-19: qty 24

## 2021-02-19 MED ORDER — FENTANYL CITRATE PF 50 MCG/ML IJ SOSY
50.0000 ug | PREFILLED_SYRINGE | Freq: Once | INTRAMUSCULAR | Status: AC
  Administered 2021-02-19: 50 ug via INTRAVENOUS

## 2021-02-19 MED ORDER — ROCURONIUM BROMIDE 50 MG/5ML IV SOLN
100.0000 mg | Freq: Once | INTRAVENOUS | Status: AC
  Administered 2021-02-19: 70 mg via INTRAVENOUS

## 2021-02-19 NOTE — Consult Note (Signed)
NAME:  Jamie [name], MRN:  [id number], DOB:  Sep 14, 2000, LOS: 12 ADMISSION DATE:  02/06/2021, CONSULTATION DATE:  02/19/21 REFERRING MD: [place]o Band , CHIEF COMPLAINT:  Seizures   History of Present Illness:  Jamie [name] is a 20 y.o. F with PMH of hypothyroidism who was admitted 10/16 with encephalopathy of unclear etiology but possibly Hashimoto's thyroiditis.  She has had a complicated course and suspicious for auto-immune encephalitis and completed 5 days steroids and started IVIG 10/22.  Head imaging negative for acute pathology.  Plan to evaluate for ovarian teratoma.  She has been baseline fairly catatonic, but on 10/24 had new onset seizure activity twice.  Neurology following and patient is undergoing EEG, no further head imaging recommended.  Her first seizure was generalized, the second more focal eye and LE twitching.  Pertinent  Medical History    has a past medical history of Acquired autoimmune hypothyroidism.  Significant Hospital Events: Including procedures, antibiotic start and stop dates in addition to other pertinent events   10/16 Admit 10/25 New onset seizure activity, PCCM consult 1026 pulmonary critical care signed off 02/19/2021 pulmonary critical care reconsulted for suspected respiratory distress.  She had a scalloping patch which was taken off her MRI not replaced this was reordered.  She will remain on stepdown unit for now and pulmonary critical care will continue to follow  Interim History / Subjective:  Pt was treated with Ativan and seizure activity resolved currently.  Hemodynamically stable and protecting her airway  Objective   Blood pressure 112/79, pulse (!) 102, temperature 98.6 F (37 C), temperature source Axillary, resp. rate (!) 24, weight 58.9 kg, last menstrual period 01/11/2021, SpO2 98 %.    FiO2 (%):  [30 %] 30 %   Intake/Output Summary (Last 24 hours) at 02/19/2021 1153 Last data filed at 02/18/2021 1300 Gross per 24 hour   Intake --  Output 1700 ml  Net -1700 ml   Filed Weights   02/12/21 1409  Weight: 58.9 kg   General: Thin female in no acute distress HEENT: MM pink/moist copious oral secretions Neuro: Rhythmic lipsmacking and movement of the head CV: Heart sounds are regular PULM: Faint rhonchi bilaterally   GI: soft, bsx4 active  GU: Extremities: warm/dry, neg edema  Skin: no rashes or lesions      Resolved Hospital Problem list     Assessment & Plan:   New onset seizure activity in the setting of ongoing AMS secondary to 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  He was initially in the intensive care unit but moved to stepdown unit and to teaching service as of 02/17/2021. Pulmonary critical care called back to evaluate for possible intubation 02/19/2021.  Noted to have copious secretions but no respiratory distress.  No chest x-ray was available for 1028, 1 was ordered, chest x-ray from 1027 was essentially clear.  We will obtain a chest x-ray Resume her scopolamine patch for copious secretions Continue supplemental oxygen if needed She discussed continue laboratory work-up per neurology and infectious disease consults. We will follow up on Saturday, 02/20/2021 and being available as needed.    Best Practice (right click and "Reselect all SmartList Selections" daily)   Per primary  Labs   CBC: Recent Labs  Lab 02/15/21 0354 02/16/21 0217 02/17/21 0420 02/18/21 0246 02/19/21 0334  WBC 7.4 9.8 8.9 7.6 7.6  NEUTROABS 5.7 8.0* 7.2 6.1 6.1  HGB 10.8* 10.8* 10.7* 10.9* 10.8*  HCT  33.9* 33.0* 32.4* 33.6* 33.6*  MCV 94.4 92.2 91.8 92.3 93.1  PLT 270 246 235 239 109    Basic Metabolic Panel: Recent Labs  Lab 02/14/21 0339 02/15/21 0354 02/16/21 0217 02/16/21 1630 02/17/21 0420 02/18/21 0246 02/19/21 0334  NA 135 134* 128* 134* 130* 128* 132*  K 3.6 3.7 4.1  3.8 3.6 3.8 3.8  CL 103 104 96* 102 99 98 100  CO2 26 24 24 27 24 25 26   GLUCOSE 189* 121* 99 100* 85 131* 101*  BUN 19 21* 18 15 17 18 20   CREATININE 0.67 0.63 0.72 0.57 0.63 0.60 0.59  CALCIUM 8.6* 8.4* 8.4* 8.7* 8.7* 8.7* 8.9  MG 2.5* 2.1 1.8  --  2.0 2.2 2.3  PHOS 2.6 1.9* 2.1*  --  3.5  --  3.5   GFR: Estimated Creatinine Clearance: 89.5 mL/min (by C-G formula based on SCr of 0.59 mg/dL). Recent Labs  Lab 02/16/21 0217 02/17/21 0420 02/18/21 0246 02/19/21 0334  WBC 9.8 8.9 7.6 7.6    Liver Function Tests: Recent Labs  Lab 02/13/21 0157 02/14/21 0339 02/15/21 0354 02/16/21 0217 02/17/21 0420  AST 16 18 14* 19 20  ALT 13 13 13 13 14   ALKPHOS 29* 33* 30* 28* 28*  BILITOT 0.6 0.8 0.4 0.8 1.3*  PROT 6.3* 6.8 7.1 7.4 8.5*  ALBUMIN 3.2* 3.0* 2.6* 2.7* 2.6*   No results for input(s): LIPASE, AMYLASE in the last 168 hours. No results for input(s): AMMONIA in the last 168 hours.  ABG No results found for: PHART, PCO2ART, PO2ART, HCO3, TCO2, ACIDBASEDEF, O2SAT   Coagulation Profile: No results for input(s): INR, PROTIME in the last 168 hours.  Cardiac Enzymes: Recent Labs  Lab 02/16/21 0217  CKTOTAL 169    HbA1C: No results found for: HGBA1C  CBG: Recent Labs  Lab 02/18/21 1602 02/18/21 2040 02/18/21 2319 02/19/21 0340 02/19/21 [place]       Steve Kayleana Waites ACNP Acute Care Nurse Practitioner [place] Please consult Amion 02/19/2021, 11:53 AM

## 2021-02-19 NOTE — Progress Notes (Signed)
On reviewing the EEG, she has left arm jerking at times which seems to be associated with some mouth movements as well and at times this appears time locked to poorly localized discharges on EEG.  After weaning propofol due to hypotension earlier, her EEG has changed from more of a GRDA pattern to now more closely resemble GPDs with theta wave morphology and a leftward predominance though they are generalized.  There is some waxing waning in this pattern, and especially given the clinical correlate earlier I would favor advancing antiepileptic therapy.  We will give 2 mg of IV Ativan now, and load with IV Depacon.  This patient is critically ill and at significant risk of neurological worsening, death and care requires constant monitoring of vital signs, hemodynamics,respiratory and cardiac monitoring, neurological assessment, discussion with family, other specialists and medical decision making of high complexity. I spent 20 minutes of neurocritical care time  in the care of  this patient. This was time spent independent of any time provided by nurse practitioner or PA.  Roland Rack, MD [facility] [phone]  If 7pm- 7am, please page neurology on call as listed in [place]. 02/19/2021  8:50 PM

## 2021-02-19 NOTE — Progress Notes (Signed)
RSI kit pulled from pyxis under another profile. Medications given during procedure scanned and documented. Excess returned.

## 2021-02-19 NOTE — Significant Event (Addendum)
Rapid Response Event Note   Reason for Call :  Called to bedside for administration of Rituxan  Initial Focused Assessment:  Pt being premedicated with Tylenol and Benadryl for Rituxan administration Pt noted to be tachypneic, RR 30-40, and tachycardic, HR 170-190 bpm. Pt appears restless. She is frequently grunting, having muscle flexure and facial twitching. Oral secretions are moderate.  Pt received 2mg  IV Ativan at 1352. Pt HR continued to escalate following administration. Pt remained agitated.   VS: BP 121/78, HR 123, RR 33, SpO2 100% on room air  Interventions:  -Neurology to bedside, second dose of 2mg  IV Ativan given -PCCM called to bedside, decision made to transfer to ICU -Hold Rituxan until after transfer to ICU  Plan of Care:  Transfer to ICU Mother at bedside  Event Summary:  MD Notified: Dr.  Lonzo Cloud provider, PCCM Call Time: [place] Time: 1405 End Time: [place], RN

## 2021-02-19 NOTE — Procedures (Signed)
Intubation Procedure Note  Jannie Doyle  [id number]  Sep 06, 2000  Date:02/19/21  Time:5:01 PM   Provider Performing:Brent Fines [place]    Procedure: Intubation (31500)  Indication(s) Respiratory Failure  Consent Unable to obtain consent due to emergent nature of procedure.   Anesthesia Etomidate, Versed, Fentanyl, and Rocuronium   Time Out Verified patient identification, verified procedure, site/side was marked, verified correct patient position, special equipment/implants available, medications/allergies/relevant history reviewed, required imaging and test results available.   Sterile Technique Usual hand hygeine, masks, and gloves were used   Procedure Description Patient positioned in bed supine.  Sedation given as noted above.  Patient was intubated with endotracheal tube using Glidescope.  View was Grade 1 full glottis .  Number of attempts was 1.  Colorimetric CO2 detector was consistent with tracheal placement.   Complications/Tolerance None; patient tolerated the procedure well. Chest X-ray is ordered to verify placement.   EBL Minimal   Specimen(s) None  Roselie Awkward, MD [place] P[facility]M Pager: [phone] Cell: [phone] After 7:00 pm call Elink  [phone]

## 2021-02-19 NOTE — Progress Notes (Addendum)
HD 12 Subjective:  Overnight Events: Patient had to have NG tube removed again due to another episode of epistaxis. Copious drooling per mom at bedside.  Required supplemental oxygen.  Patient was seen this AM with mom at bedside and family friend on phone to help with communication. She was laying in bed at an incline, her eyes are half open, intermittent grunting, sialorrhea, flaccid tone, and rhythmic lip smacking. There was also minimal spontaneous movement of hands, head, and eyelids.  Of note, RN reported patient was acutely agitated and had witnessed seizure-like activity in the afternoon.  Objective:  Vital signs in last 24 hours: Vitals:   02/19/21 1558 02/19/21 1600 02/19/21 1630 02/19/21 1645  BP:  (!) 90/49 131/87 (!) 177/156  Pulse:  (!) 158 (!) 139 (!) 132  Resp: 15 15 15  (!) 31  Temp:  (!) 103.5 F (39.7 C)    TempSrc:  Axillary    SpO2: 100% 100% 100% 100%  Weight:      Height: 5\' 2"  (1.575 m)      Supplemental O2:  SpO2: 100 % O2 Device: Ventilator O2 Flow Rate (L/min): 2 L/min FiO2 (%): 100 % Patient Activity (if Appropriate): In bed Pulse Oximetry Type: Continuous SpO2 Alarm Limit Low: 92 Oximetry Probe Site Changed: Yes   Physical Exam:  Physical Exam Vitals and nursing note reviewed.  Constitutional:      General: She is not in acute distress.    Appearance: She is ill-appearing. She is not diaphoretic.     Comments: Comatose, not responsive to pain  HENT:     Head: Normocephalic and atraumatic.     Mouth/Throat:     Comments: Some sialorrhea present Eyes:     Pupils: Pupils are equal, round, and reactive to light.     Comments: Pupillary reflex intact  Cardiovascular:     Rate and Rhythm: Regular rhythm. Tachycardia present.     Heart sounds: Normal heart sounds.  Pulmonary:     Effort: Pulmonary effort is normal. No respiratory distress.     Breath sounds: No wheezing.     Comments: Transmitted gurgling sounds from upper  respiratory Abdominal:     General: There is no distension.     Palpations: Abdomen is soft.  Musculoskeletal:     Cervical back: Neck supple. No rigidity.     Right lower leg: No edema.     Left lower leg: No edema.  Skin:    General: Skin is warm.     Findings: Rash present.     Comments: Hyperpigmented rash present on b/l thighs and down left shin, unchanged since first noticed  Neurological:     Comments: Decreased tone and upper and lower extremities bilaterally.  No tremors, cogwheel rigidity, nor contractures noted.  Gag and pupillary reflex intact.    Filed Weights   02/12/21 1409  Weight: 58.9 kg    Intake/Output Summary (Last 24 hours) at 02/19/2021 1651 Last data filed at 02/19/2021 1200 Gross per 24 hour  Intake --  Output 600 ml  Net -600 ml   Net IO Since Admission: -4,772.17 mL [02/19/21 1651]  Pertinent Labs: Results for orders placed or performed during the hospital encounter of 02/06/21 (from the past 24 hour(s))  Glucose, capillary     Status: Abnormal   Collection Time: 02/18/21  8:40 PM  Result Value Ref Range   Glucose-Capillary 104 (H) 70 - 99 mg/dL  Glucose, capillary     Status: Abnormal   Collection Time:  02/18/21 11:19 PM  Result Value Ref Range   Glucose-Capillary 105 (H) 70 - 99 mg/dL  CBC with Differential/Platelet     Status: Abnormal   Collection Time: 02/19/21  3:34 AM  Result Value Ref Range   WBC 7.6 4.0 - 10.5 K/uL   RBC 3.61 (L) 3.87 - 5.11 MIL/uL   Hemoglobin 10.8 (L) 12.0 - 15.0 g/dL   HCT 33.6 (L) 36.0 - 46.0 %   MCV 93.1 80.0 - 100.0 fL   MCH 29.9 26.0 - 34.0 pg   MCHC 32.1 30.0 - 36.0 g/dL   RDW 13.0 11.5 - 15.5 %   Platelets 268 150 - 400 K/uL   nRBC 0.0 0.0 - 0.2 %   Neutrophils Relative % 80 %   Neutro Abs 6.1 1.7 - 7.7 K/uL   Lymphocytes Relative 11 %   Lymphs Abs 0.8 0.7 - 4.0 K/uL   Monocytes Relative 8 %   Monocytes Absolute 0.6 0.1 - 1.0 K/uL   Eosinophils Relative 0 %   Eosinophils Absolute 0.0 0.0 - 0.5  K/uL   Basophils Relative 0 %   Basophils Absolute 0.0 0.0 - 0.1 K/uL   Immature Granulocytes 1 %   Abs Immature Granulocytes 0.06 0.00 - 0.07 K/uL  Basic metabolic panel     Status: Abnormal   Collection Time: 02/19/21  3:34 AM  Result Value Ref Range   Sodium 132 (L) 135 - 145 mmol/L   Potassium 3.8 3.5 - 5.1 mmol/L   Chloride 100 98 - 111 mmol/L   CO2 26 22 - 32 mmol/L   Glucose, Bld 101 (H) 70 - 99 mg/dL   BUN 20 6 - 20 mg/dL   Creatinine, Ser 0.59 0.44 - 1.00 mg/dL   Calcium 8.9 8.9 - 10.3 mg/dL   GFR, Estimated >60 >60 mL/min   Anion gap 6 5 - 15  Magnesium     Status: None   Collection Time: 02/19/21  3:34 AM  Result Value Ref Range   Magnesium 2.3 1.7 - 2.4 mg/dL  Phosphorus     Status: None   Collection Time: 02/19/21  3:34 AM  Result Value Ref Range   Phosphorus 3.5 2.5 - 4.6 mg/dL  Glucose, capillary     Status: None   Collection Time: 02/19/21  3:40 AM  Result Value Ref Range   Glucose-Capillary 98 70 - 99 mg/dL  Glucose, capillary     Status: None   Collection Time: 02/19/21  8:10 AM  Result Value Ref Range   Glucose-Capillary 96 70 - 99 mg/dL  Glucose, capillary     Status: None   Collection Time: 02/19/21 12:19 PM  Result Value Ref Range   Glucose-Capillary 94 70 - 99 mg/dL  Glucose, capillary     Status: None   Collection Time: 02/19/21  4:16 PM  Result Value Ref Range   Glucose-Capillary 97 70 - 99 mg/dL     Imaging: MR PELVIS W WO CONTRAST  Result Date: 02/18/2021 CLINICAL DATA:  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 EXAM: MRI PELVIS WITHOUT AND WITH CONTRAST TECHNIQUE: Multiplanar multisequence MR imaging of the pelvis was performed both before and after administration of intravenous contrast. CONTRAST:  32mL GADAVIST GADOBUTROL 1 MMOL/ML IV SOLN COMPARISON:  02/13/2021 CT chest, abdomen and pelvis. 02/17/2021 pelvic sonogram. FINDINGS: Urinary Tract: Indwelling Foley catheter. Expected gas  within the nondependent bladder. Otherwise normal bladder. Normal urethra. Bowel: Visualized small and large bowel are normal caliber  with no bowel wall thickening. Vascular/Lymphatic: No pathologically enlarged lymph nodes in the pelvis. No acute vascular abnormality. Reproductive: Uterus: The normal size anteverted retroflexed uterus measures 6.5 x 2.6 x 3.3 cm. No uterine fibroids. Inner myometrium (junctional zone) measures 3 mm in thickness, which is normal. Endometrium measures 4 mm in bilayer thickness, which is normal. No endometrial cavity fluid or focal endometrial mass. Unremarkable uterine cervix. Ovaries and Adnexa: The right ovary measures 2.4 x 1.6 x 1.9 cm and is normal. The left ovary measures 2.4 x 1.5 x 1.9 cm and is normal. There are no abnormal ovarian or adnexal masses. Other: No abnormal free fluid in the pelvis. No focal pelvic fluid collection. Musculoskeletal: No aggressive appearing focal osseous lesions. IMPRESSION: Normal pelvic MRI.  Symmetric normal ovaries. No adnexal masses. Electronically Signed   By: Ilona Sorrel M.D.   On: 02/18/2021 20:03   Portable Chest x-ray  Result Date: 02/19/2021 CLINICAL DATA:  OG an NG tube EXAM: PORTABLE CHEST 1 VIEW COMPARISON:  02/19/2021 FINDINGS: Endotracheal tube tip is about 2.6 cm superior to carina. Esophageal tube tip below the diaphragm but incompletely visualized. No focal opacity, pleural effusion or pneumothorax. Normal cardiac size. IMPRESSION: 1. Endotracheal tube tip about 2.6 cm superior to carina. 2. Clear lung fields Electronically Signed   By: Donavan Foil M.D.   On: 02/19/2021 16:48   DG CHEST PORT 1 VIEW  Result Date: 02/19/2021 CLINICAL DATA:  Shortness of breath EXAM: PORTABLE CHEST 1 VIEW COMPARISON:  Chest x-ray dated February 18, 2021 FINDINGS: The heart size and mediastinal contours are within normal limits. Both lungs are clear. The visualized skeletal structures are unremarkable. IMPRESSION: No active disease.  Electronically Signed   By: Yetta Glassman M.D.   On: 02/19/2021 11:57    Assessment/Plan:  Principal Problem:   Encephalitis Active Problems:   Primary hypothyroidism   Catatonia associated with another mental disorder  Patient Summary: Jamie [name] is a 20 y.o. with a pertinent PMH of Hashimoto thyroiditis, who presented with confusion and admitted for work-up and management of NMDA-receptor encephalitis.  Hospital course complicated with seizures.   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 Completed 5/5 day IVIG (10/26) and 5/5 days IV methylprednisolone (10/22).  MRI Head with and without contrast negative for acute pathology.  Qualitative HSV, VZV, CMV in CSF negative. RPR negative. HepC Ab nonreactive. Cryptococcal antigen negative. TB negative. EBV DNA  Anti-NMDA Ig positive, no ovarian teratoma on abdominal ultrasound or pelvic MRI with contrast. Transvaginal ultrasound was deferred because patient has never been sexually active.  ANA and Ro Ig positive, will have patient follow up outpatient. SPEP resulted and was abnl. Quantitative immunoglobulin levels, JC virus pending. HepB sAg nonreactive and HepB cAb positive, which is consistent with immunity against hepatitis B, which is confirmed with negative PCR HBV DNA. Patient's encephalitis has not improved at all with treatment, worsening sialorrhea, and recent seizure-like activity is concerning for ability to protect airways and aspiration. She will be transferred to ICU for intubation. Neurology, ID consulted, pastoral, PCCM appreciate assistance and recommendations. Treatment per neurology  #Fever Suspected to be 2/2 to her known NMDA receptor encephalitis vs. Possible aspiration pneumonitis. Bladder scan showed that patient wasn't retaining, so foley was removed. Urine noted to be red, UA ordered. Could be irritation from foley removal or menstrual cycle.  Follow-up UA  #Focal seizure Evident on last  overnight EEG (10/25), which showed focal seizure in right frontal central and generalized rhythmic delta activity (GRDA). She was  witnessed having seizure-like activities x2 on night of 10/24. Received Keppra loading dose (10/24). Pseudo-hyponatremia 2/2 IVIG treatment. Seizure-like activity on 10/28, despite scheduled Keppra. She was given ativan 4mg . Patient will be moved to ICU. Mg2+ >2, K+>4. Replete as necessary Continue IV Keppra 1000 mg BID Follow-up EEG  #Iso-osmolar pseudo-hyponatremia  2/2 IVIG treatment. Will continue to monitor. Trend sodium  #Malnutrition Secondary to encephalopathy and lack of p.o. intake. Core track tube placed 02/12/21, removed 10/24, replaced 10/26, then removed due to epistaxis that required afrin and nasal compact. Will consider OG tube and tube feed. Magnesium and phosphate stable. We will continue to monitor. 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 Can chronically carry for over a 1 year. We will obtain further history from mom in regards to treatment, symptoms, timeline. Will contact [facility] Department prior to D/C Follow-up GI panel for salmonella hyphae  Diet: Feeding supplement per RD Continuous: famotidine (PEPCID) IV feeding supplement (OSMOLITE 1.2 CAL), Last Rate: Stopped (02/14/21 0910) levETIRAcetam, Last Rate: 1,000 mg (02/19/21 0857) propofol (DIPRIVAN) infusion, Last Rate: 5 mcg/kg/min (02/19/21 1634) riTUXimab (RITUXAN)  NON-ONCOLOGY  infusion sodium chloride VTE: enoxaparin (LOVENOX) injection 40 mg Start: 02/17/21  2200 Place and maintain sequential compression device Start: 02/08/21 0847  Code: Full Code  PT/OT recs:  Pending Family Update: Family at bedside  Dispo: Anticipated discharge pending further workup   Merrily Brittle, DO Psychiatry Resident, PGY-1 [facility] Pager: [phone]  Please contact the on call pager after 5 pm and on weekends at [phone].

## 2021-02-19 NOTE — Progress Notes (Addendum)
NEUROLOGY CONSULTATION PROGRESS NOTE   Date of service: February 19, 2021 Patient Name: Jamie [name] MRN:  [id number] DOB:  2001-03-25  Brief HPI  Jamie [name] is a 21 y.o. 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 intake for 4 days and decreased verbalization 10/15 prior to arrival. Initial work-up revealed a mildly elevated creatinine, TSH of 10.09, negative ETOH, and an abnormally elevated thyroperoxidase Ab of 268.  Extensive workup with MRI Brain normal, LP with lymphocytic pleocytosis, mildly elevated protein, elevated IgG index with negative cultures, negative HSV, VZV.  Her NMDA Receptor IgG elevated to > 1:2560. 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  She is s/p 5 days of IV Solumedrol and is to get her 4th dose of IVIG today.   Interval Hx   Stereotyped movements today with chewing + head twitch to the left and flexion of BL upper extremities. I did not notice this when I initially saw her around 8AM but this was more prominent on re-evaluation.   She was given ativan 2mg . The movements became more violent in the afternoon and HR going upto 180s. She was intubated and transferred to Neuro ICU.  Vitals   Vitals:   02/19/21 1600 02/19/21 1615 02/19/21 1630 02/19/21 1645  BP: (!) 90/49 95/62 131/87 (!) 177/156  Pulse: (!) 158 (!) 124 (!) 139 (!) 132  Resp: 15 15 15  (!) 31  Temp: (!) 103.5 F (39.7 C)     TempSrc: Axillary     SpO2: 100% 100% 100% 100%  Weight:      Height:         Body mass index is 23.75 kg/m.  Physical Exam   General: Laying comfortably in bed; somnolent. HENT: Normal oropharynx and mucosa. Normal external appearance of ears and nose.  Neck: Supple, no pain or tenderness  CV: No JVD. No peripheral edema.  Pulmonary: Symmetric Chest rise. Normal respiratory effort.  Abdomen: Soft to touch, non-tender.  Ext: No cyanosis, edema, or  deformity  Skin: No rash. Normal palpation of skin.   Musculoskeletal: Normal digits and nails by inspection. No clubbing.   Neurologic Examination  Mental status/Cognition: eyes closed, no reponse to loud voice or clap. Grimaces to noxious stimuli. Grimaces to nares stimulation. Speech/language: No speech today, too encephalopathic to be able to assess her speech. Cranial nerves:   CN II Pupils equal and reactive to light, unable to assess for visual fied defects 2/2 eyes closed.   CN III,IV,VI EOMI intact to dolls eyes   CN V Corneals intact BL   CN VII no asymmetry, no nasolabial fold flattening   CN VIII No response to speech   CN IX & X Spontaneously coughing. still seems to be protecting her airway so far but has pooled secretions that she is coughing out.   CN XI    CN XII    Motor:  Muscle bulk: poor, tone flaccid in all extremities. Minimal movement today. No response to pinch.  Reflexes:  Right Left Comments  Pectoralis      Biceps (C5/6) 1 1   Brachioradialis (C5/6) 1 1    Triceps (C6/7) 1 1    Patellar (L3/4) 1 1    Achilles (S1)      Hoffman      Plantar mute mute   Jaw jerk    Sensation: No grimace today to pain.  Coordination/Complex Motor:  Unable to assess coordination.  Labs  Basic Metabolic Panel:  Lab Results  Component Value Date   NA 141 02/19/2021   K 3.4 (L) 02/19/2021   CO2 26 02/19/2021   GLUCOSE 101 (H) 02/19/2021   BUN 20 02/19/2021   CREATININE 0.59 02/19/2021   CALCIUM 8.9 02/19/2021   GFRNONAA >60 02/19/2021   HbA1c: No results found for: HGBA1C LDL:  Lab Results  Component Value Date   LDLCALC 62 02/09/2021   Urine Drug Screen:     Component Value Date/Time   LABOPIA NONE DETECTED 02/07/2021 0600   COCAINSCRNUR NONE DETECTED 02/07/2021 0600   LABBENZ NONE DETECTED 02/07/2021 0600   AMPHETMU NONE DETECTED 02/07/2021 0600   THCU NONE DETECTED 02/07/2021 0600   LABBARB NONE DETECTED 02/07/2021 0600    Alcohol Level      Component Value Date/Time   ETH <10 02/06/2021 1435   No results found for: PHENYTOIN, ZONISAMIDE, LAMOTRIGINE, LEVETIRACETA No results found for: PHENYTOIN, PHENOBARB, VALPROATE, CBMZ  Imaging and Diagnostic studies   MR BRAIN W/WO CONTRAST Officialy read as normal MRI of brain but there is some concern for subtle enhancement of the posterior colliculi on axial post contrast imaging.  N-methyl-D-Aspartate Recpt.IgG >1:2560   CSF IgG index: 1.4  Oligoclonal Bands: Three (3) oligoclonal bands were observed in the CSF, which were not detected in the serum sample.   CSF WBC: 318, 94% lymphocytes.  CSF Protein: 50  CSF Glucose: 68.  CSF culture with gram stain: PMN seen but no organisms seen on smear, no growth so far. CSF Fungal Cx: NO FUNGUS ISOLATED AFTER 7 DAYS.  Serum HIV 1/2: non reactive.  CSF Cryptococcus Ag: negative  CMV CSF: negative  HSV 1/2 PCR CSF: Negative  VZV PCR CSF: negative.   Impression   Jamie [name] is a 20 y.o. 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 intake for 4 days and decreased verbalization 10/15 prior to arrival. Initial concerns for Hashimoto's encephalitis but extensive workup with CSF pleocytosis, positive NMDA receptor IgG, elevated IgG index and 3 oligoclonal bands in the CSF that are not noted in the serum. R frontal seizure on cEEG on 10/25, has not had any since she has been on Keppra 1000mg  BID. 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 She completed 5 days of high dose IV Solumedrol and completed IVIG x 5 doses. Next step is to do Rituximab.  Recommendations  - Completed IVMP 1G Q24 hours x 5 doses and IVIG x 5 doses. - Plan is to give Rituximab 1000mg  IV today with pretreatment per protocol. Will tentatively  plan for a repeat dose in 14 days. - Quantitative immunoglobulin levels(unreliable as she was on IVIG), Quantiferon(indeterminate), JC virus(pending), SPEP(hypergammaglobulinemia with no monoclonal spike), Hb core Ab is positive. Hep B DNA negative. EBV PCR CSF is positive but quantitative PCR < 35. - Continue Keppra 1G Q12 hours. - MR Pelvis w + w/o C is negative for an Ovarian teratoma.   __________________________________________________________________  This patient is critically ill and at significant risk of neurological worsening, death and care requires constant monitoring of vital signs, hemodynamics,respiratory and cardiac monitoring, neurological assessment, discussion with family, other specialists and medical decision making of high complexity. I spent 60 minutes of neurocritical care time  in the care of  this patient. This was time spent independent of any time provided  by nurse practitioner or PA.  [place] Pager Number [phone] 02/19/2021  6:00 PM   Plan discussed with patient's mother at bedside and with primary team.  Thank you for the opportunity to take part in the care of this patient. If you have any further questions, please contact the neurology consultation attending.  Signed,  [place] Pager Number [phone]

## 2021-02-19 NOTE — Progress Notes (Signed)
Patient transferred to 4N to ensure airway. Intubating now. EEG LTM hookup on hold.

## 2021-02-19 NOTE — Progress Notes (Signed)
[facility] Progress Note Patient Name: Jamie [name] DOB: 20-Jul-2000 MRN: [id number]   Date of Service  02/19/2021  HPI/Events of Note  Patient with EEG changes concerning for imminent seizures, and Dr. Leonel Ramsay is requesting vasopressor support to enable deepening sedation with Propofol, as patient's blood pressure is currently marginal.  eICU Interventions  Peripheral Phenylephrine gtt protocol ordered.        [name] [name] 02/19/2021, 9:00 PM

## 2021-02-19 NOTE — Progress Notes (Signed)
[facility] PCCM  Patient moved emergently to the ICU due to worsening respiratory status, tachycardia today  She was admitted on 02/06/2021 and has been treated for autoimmune encephalitis (specifically NMDA receptor encephalitis) with IV solumedrol and IVIg.  Today her respiratory status worsened so she was moved to the intensive care unit.    She has had fever over the last few days.    On arrival to the intensive care unit she was tachypneic, tachycardic and not protecting her airway.  She was intubated for full airway support  Vitals:   02/19/21 1558 02/19/21 1600  BP:  (!) 90/49  Pulse:  (!) 158  Resp: 15 15  Temp:  (!) 103.5 F (39.7 C)  SpO2: 100% 100%   Vent Mode: PRVC FiO2 (%):  [30 %-100 %] 100 % Set Rate:  [15 bmp] 15 bmp Vt Set:  [400 mL] 400 mL PEEP:  [5 cmH20] 5 cmH20 Plateau Pressure:  [12 cmH20] 12 cmH20  General:  Tachypnea, continuous moaning/screaming in bed HENT: NCAT OP clear PULM: CTA B, normal effort CV: Tachycardic, no mgr GI: BS+, soft, nontender MSK: normal bulk and tone Derm: hyperpigmented macular, non-raised, non-pustular rash over legs Neuro: eyes open, head turned to right, no tracking, non-verbal but moans, doesn't follow commands    CXR clear, no infiltrate  Impression/Plan NMDA receptor autoimmune encephalitis: plan Rituximab tomorrow, has received steroids, IVIg Inability to protect airway due to encephalitis: full vent support, intubate, vap prevention Fever: presumably due to NMDA encephalitis, planning treatment as above but will also survey CXR to ensure no infiltrate  Rest of plan per neuro/medicine  Mother updated  CC time 35 minutes  Roselie Awkward, MD [place] PCCM Pager: [phone] Cell: [phone] After 7:00 pm call Elink  [phone]

## 2021-02-19 NOTE — Progress Notes (Signed)
   02/19/21 1518  Assess: MEWS Score  Temp 98.7 F (37.1 C)  BP 121/78  Pulse Rate (!) 173  ECG Heart Rate (!) 170  Resp (!) 40  SpO2 100 %  O2 Device Room Air  Assess: MEWS Score  MEWS Temp 0  MEWS Systolic 0  MEWS Pulse 3  MEWS RR 3  MEWS LOC 1  MEWS Score 7  MEWS Score Color Red  Assess: if the MEWS score is Yellow or Red  Were vital signs taken at a resting state? Yes  Focused Assessment Change from prior assessment (see assessment flowsheet)  Early Detection of Sepsis Score *See Row Information* Low  MEWS guidelines implemented *See Row Information* Yes  Treat  MEWS Interventions Administered prn meds/treatments;Escalated (See documentation below)  Take Vital Signs  Increase Vital Sign Frequency  Red: Q 1hr X 4 then Q 4hr X 4, if remains red, continue Q 4hrs  Escalate  MEWS: Escalate Red: discuss with charge nurse/RN and provider, consider discussing with RRT  Notify: Charge Nurse/RN  Name of Charge Nurse/RN Notified Aaron Edelman, RN  Date Charge Nurse/RN Notified 02/19/21  Time Charge Nurse/RN Notified 1530  Notify: Provider  Provider Name/Title Dr. Lorrin Goodell  Date Provider Notified 02/19/21  Time Provider Notified 1530  Notification Type Call (by Wilburn Cornelia, RN)  Notification Reason Change in status  Provider response En route  Notify: Rapid Response  Name of Rapid Response RN Notified Wilburn Cornelia, RN  Date Rapid Response Notified 02/19/21  Document  Patient Outcome Transferred/level of care increased

## 2021-02-19 NOTE — Progress Notes (Signed)
Patient transferred to 4N-28 with [place] at bedside. Remains nonverbal and HR sustaining in 170s. Patient's mother escorted to new room by Student RNs. Rituxan and Pepcid delivered to bedside in case it it decided to be given today.

## 2021-02-19 NOTE — Progress Notes (Signed)
LTM setup done and recording at bedside. . No skin breakdown noted. Results pending.

## 2021-02-20 ENCOUNTER — Inpatient Hospital Stay (HOSPITAL_COMMUNITY): Payer: Medicaid Other

## 2021-02-20 DIAGNOSIS — G9341 Metabolic encephalopathy: Secondary | ICD-10-CM

## 2021-02-20 DIAGNOSIS — B2700 Gammaherpesviral mononucleosis without complication: Secondary | ICD-10-CM

## 2021-02-20 DIAGNOSIS — G0490 Encephalitis and encephalomyelitis, unspecified: Secondary | ICD-10-CM | POA: Diagnosis not present

## 2021-02-20 DIAGNOSIS — Z789 Other specified health status: Secondary | ICD-10-CM

## 2021-02-20 DIAGNOSIS — G0481 Other encephalitis and encephalomyelitis: Principal | ICD-10-CM

## 2021-02-20 DIAGNOSIS — E039 Hypothyroidism, unspecified: Secondary | ICD-10-CM | POA: Diagnosis not present

## 2021-02-20 DIAGNOSIS — R768 Other specified abnormal immunological findings in serum: Secondary | ICD-10-CM

## 2021-02-20 DIAGNOSIS — F061 Catatonic disorder due to known physiological condition: Secondary | ICD-10-CM | POA: Diagnosis not present

## 2021-02-20 LAB — COMPREHENSIVE METABOLIC PANEL
ALT: 16 U/L (ref 0–44)
AST: 29 U/L (ref 15–41)
Albumin: 2.8 g/dL — ABNORMAL LOW (ref 3.5–5.0)
Alkaline Phosphatase: 29 U/L — ABNORMAL LOW (ref 38–126)
Anion gap: 10 (ref 5–15)
BUN: 17 mg/dL (ref 6–20)
CO2: 22 mmol/L (ref 22–32)
Calcium: 8.9 mg/dL (ref 8.9–10.3)
Chloride: 108 mmol/L (ref 98–111)
Creatinine, Ser: 0.62 mg/dL (ref 0.44–1.00)
GFR, Estimated: >60 mL/min (ref >60)
Glucose, Bld: 89 mg/dL (ref 70–99)
Potassium: 2.8 mmol/L — ABNORMAL LOW (ref 3.5–5.1)
Sodium: 140 mmol/L (ref 135–145)
Total Bilirubin: 0.9 mg/dL (ref 0.3–1.2)
Total Protein: 8.9 g/dL — ABNORMAL HIGH (ref 6.5–8.1)

## 2021-02-20 LAB — GLUCOSE, CAPILLARY
Glucose-Capillary: 78 mg/dL (ref 70–99)
Glucose-Capillary: 81 mg/dL (ref 70–99)
Glucose-Capillary: 81 mg/dL (ref 70–99)
Glucose-Capillary: 86 mg/dL (ref 70–99)
Glucose-Capillary: 93 mg/dL (ref 70–99)
Glucose-Capillary: 97 mg/dL (ref 70–99)

## 2021-02-20 LAB — URINALYSIS, ROUTINE W REFLEX MICROSCOPIC
Bilirubin Urine: NEGATIVE
Glucose, UA: NEGATIVE mg/dL
Ketones, ur: 5 mg/dL — AB
Nitrite: NEGATIVE
Protein, ur: NEGATIVE mg/dL
Specific Gravity, Urine: 1.015 (ref 1.005–1.030)
pH: 5 (ref 5.0–8.0)

## 2021-02-20 LAB — CBC WITH DIFFERENTIAL/PLATELET
Abs Immature Granulocytes: 0.11 10*3/uL — ABNORMAL HIGH (ref 0.00–0.07)
Basophils Absolute: 0 10*3/uL (ref 0.0–0.1)
Basophils Relative: 0 %
Eosinophils Absolute: 0.1 10*3/uL (ref 0.0–0.5)
Eosinophils Relative: 1 %
HCT: 35.8 % — ABNORMAL LOW (ref 36.0–46.0)
Hemoglobin: 11.6 g/dL — ABNORMAL LOW (ref 12.0–15.0)
Immature Granulocytes: 2 %
Lymphocytes Relative: 19 %
Lymphs Abs: 1.2 10*3/uL (ref 0.7–4.0)
MCH: 30.2 pg (ref 26.0–34.0)
MCHC: 32.4 g/dL (ref 30.0–36.0)
MCV: 93.2 fL (ref 80.0–100.0)
Monocytes Absolute: 0.8 10*3/uL (ref 0.1–1.0)
Monocytes Relative: 12 %
Neutro Abs: 4.4 10*3/uL (ref 1.7–7.7)
Neutrophils Relative %: 66 %
Platelets: 249 10*3/uL (ref 150–400)
RBC: 3.84 MIL/uL — ABNORMAL LOW (ref 3.87–5.11)
RDW: 13.2 % (ref 11.5–15.5)
WBC: 6.6 10*3/uL (ref 4.0–10.5)
nRBC: 0 % (ref 0.0–0.2)

## 2021-02-20 LAB — SODIUM, URINE, RANDOM: Sodium, Ur: 40 mmol/L

## 2021-02-20 LAB — OSMOLALITY, URINE: Osmolality, Ur: 770 mOsm/kg (ref 300–900)

## 2021-02-20 LAB — VALPROIC ACID LEVEL: Valproic Acid Lvl: 128 ug/mL — ABNORMAL HIGH (ref 50.0–100.0)

## 2021-02-20 LAB — JC VIRUS DNA,PCR (WHOLE BLOOD): JC Virus DNA, PCR, Blood: NEGATIVE

## 2021-02-20 LAB — TRIGLYCERIDES: Triglycerides: 105 mg/dL (ref <150)

## 2021-02-20 MED ORDER — OSMOLITE 1.2 CAL PO LIQD
1000.0000 mL | ORAL | Status: DC
  Administered 2021-02-20: 1000 mL

## 2021-02-20 MED ORDER — PANTOPRAZOLE 2 MG/ML SUSPENSION
40.0000 mg | Freq: Every day | ORAL | Status: DC
  Administered 2021-02-20 – 2021-03-15 (×23): 40 mg
  Filled 2021-02-20 (×25): qty 20

## 2021-02-20 MED ORDER — ACETAMINOPHEN 325 MG PO TABS
650.0000 mg | ORAL_TABLET | Freq: Once | ORAL | Status: DC
  Filled 2021-02-20: qty 2

## 2021-02-20 MED ORDER — TENOFOVIR DISOPROXIL FUMARATE 300 MG PO TABS
300.0000 mg | ORAL_TABLET | Freq: Every day | ORAL | Status: DC
  Administered 2021-02-20 – 2021-03-16 (×26): 300 mg
  Filled 2021-02-20 (×25): qty 1

## 2021-02-20 MED ORDER — SODIUM CHLORIDE 0.9 % IV SOLN
1000.0000 mg | Freq: Once | INTRAVENOUS | Status: DC
  Filled 2021-02-20: qty 100

## 2021-02-20 MED ORDER — POTASSIUM CHLORIDE 10 MEQ/100ML IV SOLN
10.0000 meq | INTRAVENOUS | Status: DC
  Administered 2021-02-20: 10 meq via INTRAVENOUS
  Filled 2021-02-20: qty 100

## 2021-02-20 MED ORDER — ACETAMINOPHEN 160 MG/5ML PO SOLN
650.0000 mg | ORAL | Status: AC | PRN
  Administered 2021-02-20 – 2021-02-25 (×6): 650 mg
  Filled 2021-02-20 (×7): qty 20.3

## 2021-02-20 MED ORDER — POLYETHYLENE GLYCOL 3350 17 G PO PACK
17.0000 g | PACK | Freq: Every day | ORAL | Status: DC
  Administered 2021-02-21 – 2021-02-22 (×2): 17 g
  Filled 2021-02-20 (×2): qty 1

## 2021-02-20 MED ORDER — POTASSIUM CHLORIDE 10 MEQ/100ML IV SOLN
10.0000 meq | INTRAVENOUS | Status: AC
  Administered 2021-02-20 (×3): 10 meq via INTRAVENOUS
  Filled 2021-02-20 (×3): qty 100

## 2021-02-20 MED ORDER — OSMOLITE 1.2 CAL PO LIQD
1000.0000 mL | ORAL | Status: DC
  Administered 2021-02-21 – 2021-02-25 (×4): 1000 mL

## 2021-02-20 MED ORDER — LORAZEPAM 2 MG/ML IJ SOLN
2.0000 mg | INTRAMUSCULAR | Status: AC
  Administered 2021-02-20: 2 mg via INTRAVENOUS
  Filled 2021-02-20: qty 1

## 2021-02-20 MED ORDER — PROSOURCE TF PO LIQD
45.0000 mL | Freq: Three times a day (TID) | ORAL | Status: DC
  Administered 2021-02-20 – 2021-02-25 (×15): 45 mL
  Filled 2021-02-20 (×15): qty 45

## 2021-02-20 MED ORDER — POTASSIUM CHLORIDE 20 MEQ PO PACK
40.0000 meq | PACK | Freq: Once | ORAL | Status: AC
  Administered 2021-02-20: 40 meq
  Filled 2021-02-20: qty 2

## 2021-02-20 MED ORDER — ACETAMINOPHEN 10 MG/ML IV SOLN
1000.0000 mg | Freq: Once | INTRAVENOUS | Status: AC
  Administered 2021-02-20: 1000 mg via INTRAVENOUS
  Filled 2021-02-20: qty 100

## 2021-02-20 MED ORDER — HEPARIN SODIUM (PORCINE) 1000 UNIT/ML DIALYSIS
1000.0000 [IU] | INTRAMUSCULAR | Status: DC | PRN
  Administered 2021-02-20: 2400 [IU] via INTRAVENOUS_CENTRAL
  Administered 2021-02-21: 1000 [IU] via INTRAVENOUS_CENTRAL
  Filled 2021-02-20: qty 5
  Filled 2021-02-20: qty 6

## 2021-02-20 MED ORDER — DIPHENHYDRAMINE HCL 25 MG PO CAPS
50.0000 mg | ORAL_CAPSULE | Freq: Once | ORAL | Status: DC
  Filled 2021-02-20: qty 2

## 2021-02-20 NOTE — Progress Notes (Signed)
LTM checked. Pz and C4 reapplied. Results pending.

## 2021-02-20 NOTE — Procedures (Addendum)
Patient Name: Jamie [name]  MRN: [id number]  Epilepsy Attending: Lora Havens  Referring Physician/Provider: Dr Donnetta Simpers Duration: 02/19/2021 1628 to 02/20/2021 1628   Patient history:  20 y.o. female with PMH significant for Hashimoto thyroiditis who presents with vomiting, headache and not eating for 4 days and not talking since today. EEG to evaluate for seizure   Level of alertness: lethargic   AEDs during EEG study: LEV, VPA, Propofol   Technical aspects: This EEG study was done with scalp electrodes positioned according to the 10-20 International system of electrode placement. Electrical activity was acquired at a sampling rate of 500Hz  and reviewed with a high frequency filter of 70Hz  and a low frequency filter of 1Hz . EEG data were recorded continuously and digitally stored.    Description: EEG initially showed continuous generalized high amplitude rhythmic sharply contoured 2-3 Hz  delta slowing. Sharp transient were also noted in left posterior quadrant. Patient was noted to have intermittent left upper extremity jerking. Concomitant eeg showed similar high amplitude 2-3hz  delta slowing with sharp transient in left posterior region. Propofol dose was adjusted. After around  2318 on 02/19/2021, EEG pattern evolved into continuous generalized polymorphic mixed frequencies with predominantly 3-6hz  theta-delta slowing admixed with 15-18Hz  fronto-central beta activity.    ABNORMALITY - Generalized rhythmic delta activity ( GRDA) - Continuous slow, generalized   IMPRESSION: This study is suggestive of severe diffuse encephalopathy, nonspecific etiology.  Patient was noted to have intermittent left upper extremity jerking without definite eeg change. However focal motor seizures may not be seen on scalp eeg. Clinical correlation if recommended.   Jamie [name]

## 2021-02-20 NOTE — Progress Notes (Signed)
Pts current temperature 100.4.  Order for PRN Tylenol is for a temp >/= 101.  Neurology aware and okay with giving PRN Tylenol now.

## 2021-02-20 NOTE — Procedures (Signed)
Central Venous Catheter Insertion Procedure Note  Jamie [name]  [id number]  11/04/2000  Date:02/20/21  Time:4:37 PM   Provider Performing:Jatoya Armbrister D. Harris   Procedure: Insertion of Non-tunneled Central Venous Catheter(36556)with US guidance (50539)      Indication(s) Hemodialysis/Plasmapheresis   Consent Risks of the procedure as well as the alternatives and risks of each were explained to the patient and/or caregiver.  Consent for the procedure was obtained and is signed in the bedside chart  Anesthesia Topical only with 1% lidocaine   Timeout Verified patient identification, verified procedure, site/side was marked, verified correct patient position, special equipment/implants available, medications/allergies/relevant history reviewed, required imaging and test results available.  Sterile Technique Maximal sterile technique including full sterile barrier drape, hand hygiene, sterile gown, sterile gloves, mask, hair covering, sterile ultrasound probe cover (if used).  Procedure Description Area of catheter insertion was cleaned with chlorhexidine and draped in sterile fashion.   With real-time ultrasound guidance a HD catheter was placed into the right internal jugular vein.  Nonpulsatile blood flow and easy flushing noted in all ports.  The catheter was sutured in place and sterile dressing applied.  Complications/Tolerance None; patient tolerated the procedure well. Chest X-ray is ordered to verify placement for internal jugular or subclavian cannulation.  Chest x-ray is not ordered for femoral cannulation.  EBL Minimal  Specimen(s) None  Trinton Prewitt D. Kenton Kingfisher, NP-C [facility] Personal contact information can be found on Amion  02/20/2021, 4:37 PM

## 2021-02-20 NOTE — Progress Notes (Addendum)
NEUROLOGY CONSULTATION PROGRESS NOTE   Date of service: February 20, 2021 Patient Name: Imane Burrough MRN:  [id number] DOB:  12-14-2000  Brief HPI  Chaya Eppolito is a 20 y.o. 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 intake for 4 days and decreased verbalization 10/15 prior to arrival. Initial work-up revealed a mildly elevated creatinine, TSH of 10.09, negative ETOH, and an abnormally elevated thyroperoxidase Ab of 268.  Extensive workup with MRI Brain normal, LP with lymphocytic pleocytosis, mildly elevated protein, elevated IgG index with negative cultures, negative HSV, VZV.  Her NMDA Receptor IgG elevated to > 1:2560. 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  She is s/p 5 days of IV Solumedrol and 5 doses of IVIG with no significant improvement.   Interval Hx   Had movements concerning for faciobrachial seizures in the afternoon. Given ativan and intubated and transferred to Neuro ICU and started on cEEG. Overnight Keppra increased to 1500mg  BID, alproic acid 500mg  Q8H and Propofol uptitrated to 64mcg/Kg/min.  Vitals   Vitals:   02/20/21 1100 02/20/21 1115 02/20/21 1130 02/20/21 1145  BP: 97/75 91/72 94/74    Pulse:    (!) 108  Resp: 19 (!) 28 15 15   Temp: (!) 94.5 F (34.7 C) (!) 94.6 F (34.8 C) (!) 95 F (35 C) (!) 95.2 F (35.1 C)  TempSrc:      SpO2: 100%   100%  Weight:      Height:         Body mass index is 23.75 kg/m.  Physical Exam   General: Laying comfortably in bed; somnolent. HENT: Normal oropharynx and mucosa. Normal external appearance of ears and nose.  Neck: Supple, no pain or tenderness  CV: No JVD. No peripheral edema.  Pulmonary: Symmetric Chest rise. Normal respiratory effort.  Abdomen: Soft to touch, non-tender.  Ext: No cyanosis, edema, or deformity  Skin: No rash. Normal palpation of skin.   Musculoskeletal: Normal digits and nails  by inspection. No clubbing.   Neurologic Examination  Mental status/Cognition: eyes closed, no reponse to loud voice or clap. Grimaces to noxious stimuli. Grimaces to nares stimulation. Speech/language: No speech today, too encephalopathic to be able to assess her speech. Cranial nerves:   CN II Pupils equal and reactive to light, unable to assess for visual fied defects 2/2 eyes closed.   CN III,IV,VI EOMI intact to dolls eyes   CN V Corneals intact BL   CN VII no asymmetry, no nasolabial fold flattening   CN VIII No response to speech   CN IX & X Spontaneously coughing. still seems to be protecting her airway so far but has pooled secretions that she is coughing out.   CN XI    CN XII    Motor:  Muscle bulk: poor, tone flaccid in all extremities. Minimal movement today. No response to pinch.  Reflexes:  Right Left Comments  Pectoralis      Biceps (C5/6) 1 1   Brachioradialis (C5/6) 1 1    Triceps (C6/7) 1 1    Patellar (L3/4) 1 1    Achilles (S1)      Hoffman      Plantar mute mute   Jaw jerk    Sensation: No grimace today to pain.  Coordination/Complex Motor:  Unable to assess coordination.  Labs   Basic Metabolic Panel:  Lab Results  Component Value Date   NA 140 02/20/2021   K 2.8 (L)  02/20/2021   CO2 22 02/20/2021   GLUCOSE 89 02/20/2021   BUN 17 02/20/2021   CREATININE 0.62 02/20/2021   CALCIUM 8.9 02/20/2021   GFRNONAA >60 02/20/2021   HbA1c: No results found for: HGBA1C LDL:  Lab Results  Component Value Date   LDLCALC 62 02/09/2021   Urine Drug Screen:     Component Value Date/Time   LABOPIA NONE DETECTED 02/07/2021 0600   COCAINSCRNUR NONE DETECTED 02/07/2021 0600   LABBENZ NONE DETECTED 02/07/2021 0600   AMPHETMU NONE DETECTED 02/07/2021 0600   THCU NONE DETECTED 02/07/2021 0600   LABBARB NONE DETECTED 02/07/2021 0600    Alcohol Level     Component Value Date/Time   ETH <10 02/06/2021 1435   No results found for: PHENYTOIN,  ZONISAMIDE, LAMOTRIGINE, LEVETIRACETA Lab Results  Component Value Date   VALPROATE 128 (H) 02/20/2021    Imaging and Diagnostic studies   MR BRAIN W/WO CONTRAST Officialy read as normal MRI of brain but there is some concern for subtle enhancement of the posterior colliculi on axial post contrast imaging.  N-methyl-D-Aspartate Recpt.IgG >1:2560   CSF IgG index: 1.4  Oligoclonal Bands: Three (3) oligoclonal bands were observed in the CSF, which were not detected in the serum sample.   CSF WBC: 318, 94% lymphocytes.  CSF Protein: 50  CSF Glucose: 68.  CSF culture with gram stain: PMN seen but no organisms seen on smear, no growth so far. CSF Fungal Cx: NO FUNGUS ISOLATED AFTER 7 DAYS.  Serum HIV 1/2: non reactive.  CSF Cryptococcus Ag: negative  CMV CSF: negative  HSV 1/2 PCR CSF: Negative  VZV PCR CSF: negative.  EBV DNA QN by PCR: Positive <35  HBV sAg: non reactive, HBV Core Ab: reactive. HBV DNA PCR: not detected.   Impression   Shatonia Hoots is a 20 y.o. 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 intake for 4 days and decreased verbalization 10/15 prior to arrival. Initial concerns for Hashimoto's encephalitis but extensive workup with CSF pleocytosis, positive NMDA receptor IgG, elevated IgG index and 3 oligoclonal bands in the CSF that are not noted in the serum. R frontal seizure on cEEG on 10/25, has not had any since she has been on Keppra 1000mg  BID. 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 She completed 5 days of high dose IV Solumedrol and completed IVIG x 5 doses. Next step is to do Rituximab.  ID team consulted and felt risk of EBV recurrence is low at this time. EBV Ab profile ordered by ID and pending.  Recommendations  -  Completed IVMP 1G Q24 hours x 5 doses and IVIG x 5 doses. 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 Hep B DNA negative. EBV PCR CSF is positive but quantitative PCR < 35. - Continue Keppra 1500g BID, Valproid acid 500 TID and Propofol 71mcg/Kg/min. - MR Pelvis w + w/o C is negative for an Ovarian teratoma. __________________________________________________________________  Update: Initially planning for Rituximab but on discussion with Neurocritical Care team at [facility], they recommended PLEX. The [facility] team does have more experience with NMDAR Encephalitis than we do. There is evidence that PLEX does help with NMDAR Encephalitis. Not sure how helpful it will be specially with the concern for primarily  Intrathecal antibody production given + Oligoclonal bands and elevated IgG index. Given there is evidence of PLEX being used with benefit, will start PLEX and hold off on Rituximab(PLEX will remove any monoclonal Ab). If she does not show improvement with PLEX, will then do Rituximab.  This patient is critically ill and at significant risk of neurological worsening, death and care requires constant monitoring of vital signs, hemodynamics,respiratory and cardiac monitoring, neurological assessment, discussion with family, other specialists and medical decision making of high complexity. I spent 60 minutes of neurocritical care time  in the care of  this patient. This was time spent independent of any time provided by nurse practitioner or PA.  [place] Pager Number [phone] 02/20/2021  11:56 AM   Plan discussed with patient's mother at bedside and with primary team.  Thank you for the opportunity to take part in the care of this patient. If you have any further questions, please contact the neurology consultation  attending.  Signed,  [place] Pager Number [phone]

## 2021-02-20 NOTE — Progress Notes (Addendum)
Subjective: Patient on the ventilator and encephalopathic   Antibiotics:  Anti-infectives (From admission, onward)    Start     Dose/Rate Route Frequency Ordered Stop   02/20/21 1200  tenofovir (VIREAD) tablet 300 mg        300 mg Per Tube Daily 02/20/21 1113         Medications: Scheduled Meds:  acetaminophen  650 mg Per Tube Once   chlorhexidine gluconate (MEDLINE KIT)  15 mL Mouth Rinse BID   Chlorhexidine Gluconate Cloth  6 each Topical Daily   diphenhydrAMINE  50 mg Per Tube Once   docusate  100 mg Per Tube BID   enoxaparin (LOVENOX) injection  40 mg Subcutaneous Q24H   free water  30 mL Per Tube Q4H   levothyroxine  100 mcg Per Tube Q0600   mouth rinse  15 mL Mouth Rinse 10 times per day   pantoprazole sodium  40 mg Per Tube QHS   [START ON 02/21/2021] polyethylene glycol  17 g Per Tube Daily   scopolamine  1 patch Transdermal Q72H   senna-docusate  1 tablet Per NG tube Q0600   tenofovir  300 mg Per Tube Daily   thiamine  100 mg Per Tube Daily   valproic acid  500 mg Per Tube Q8H   Continuous Infusions:  sodium chloride Stopped (02/20/21 1020)   famotidine (PEPCID) IV     feeding supplement (OSMOLITE 1.2 CAL) Stopped (02/14/21 0910)   levETIRAcetam Stopped (02/20/21 0952)   phenylephrine (NEO-SYNEPHRINE) Adult infusion 30 mcg/min (02/20/21 1100)   potassium chloride 10 mEq (02/20/21 1211)   propofol (DIPRIVAN) infusion 40 mcg/kg/min (02/20/21 1100)   riTUXimab (RITUXAN)  NON-ONCOLOGY  infusion     sodium chloride     PRN Meds:.acetaminophen (TYLENOL) oral liquid 160 mg/5 mL **OR** acetaminophen, albuterol, atropine, diphenhydrAMINE, EPINEPHrine, famotidine (PEPCID) IV, fentaNYL (SUBLIMAZE) injection, fentaNYL (SUBLIMAZE) injection, methylPREDNISolone (SOLU-MEDROL) injection, sodium chloride    Objective: Weight change:   Intake/Output Summary (Last 24 hours) at 02/20/2021 1235 Last data filed at 02/20/2021 1200 Gross per 24 hour  Intake  1003.22 ml  Output 950 ml  Net 53.22 ml   Blood pressure 99/85, pulse (!) 173, temperature (!) 95.7 F (35.4 C), resp. rate 15, height 5' 2" (1.575 m), weight 58.9 kg, last menstrual period 01/11/2021, SpO2 100 %. Temp:  [92.7 F (33.7 C)-103.5 F (39.7 C)] 95.7 F (35.4 C) (10/29 1215) Pulse Rate:  [67-173] 173 (10/29 1215) Resp:  [13-40] 15 (10/29 1215) BP: (68-183)/(23-156) 99/85 (10/29 1215) SpO2:  [97 %-100 %] 100 % (10/29 1215) FiO2 (%):  [40 %-100 %] 40 % (10/29 1145)  Physical Exam: Physical Exam Nursing note reviewed.  Constitutional:      Appearance: She is underweight. She is ill-appearing.     Interventions: She is intubated.  HENT:     Head: Normocephalic and atraumatic.  Eyes:     Extraocular Movements: Extraocular movements intact.  Cardiovascular:     Rate and Rhythm: Regular rhythm. Tachycardia present.  Pulmonary:     Effort: She is intubated.  Abdominal:     Palpations: Abdomen is soft.  Musculoskeletal:        General: No swelling.     Right lower leg: No edema.     Left lower leg: No edema.  Skin:    General: Skin is warm and dry.  Neurological:     Mental Status: She is disoriented.    She remains encephalopathic CBC:  BMET Recent Labs    02/19/21 0334 02/19/21 1719 02/20/21 0636  NA 132* 141 140  K 3.8 3.4* 2.8*  CL 100  --  108  CO2 26  --  22  GLUCOSE 101*  --  89  BUN 20  --  17  CREATININE 0.59  --  0.62  CALCIUM 8.9  --  8.9     Liver Panel  Recent Labs    02/20/21 0636  PROT 8.9*  ALBUMIN 2.8*  AST 29  ALT 16  ALKPHOS 29*  BILITOT 0.9       Sedimentation Rate No results for input(s): ESRSEDRATE in the last 72 hours. C-Reactive Protein No results for input(s): CRP in the last 72 hours.  Micro Results: Recent Results (from the past 720 hour(s))  Urine Culture     Status: Abnormal   Collection Time: 02/07/21  6:08 AM   Specimen: Urine, Clean Catch  Result Value Ref Range Status   Specimen Description  URINE, CLEAN CATCH  Final   Special Requests   Final    NONE Performed at [facility], [address], [place], [place] [address]    Culture MULTIPLE SPECIES PRESENT, SUGGEST RECOLLECTION (A)  Final   Report Status 02/08/2021 FINAL  Final  Resp Panel by RT-PCR (Flu A&B, Covid) Nasopharyngeal Swab     Status: None   Collection Time: 02/07/21 10:12 AM   Specimen: Nasopharyngeal Swab; Nasopharyngeal(NP) swabs in vial transport medium  Result Value Ref Range Status   SARS Coronavirus 2 by RT PCR NEGATIVE NEGATIVE Final    Comment: (NOTE) SARS-CoV-2 target nucleic acids are NOT DETECTED.  The SARS-CoV-2 RNA is generally detectable in upper respiratory specimens during the acute phase of infection. The lowest concentration of SARS-CoV-2 viral copies this assay can detect is 138 copies/mL. A negative result does not preclude SARS-Cov-2 infection and should not be used as the sole basis for treatment or other patient management decisions. A negative result may occur with  improper specimen collection/handling, submission of specimen other than nasopharyngeal swab, presence of viral mutation(s) within the areas targeted by this assay, and inadequate number of viral copies(<138 copies/mL). A negative result must be combined with clinical observations, patient history, and epidemiological information. The expected result is Negative.  Fact Sheet for Patients:  EntrepreneurPulse.com.au  Fact Sheet for Healthcare Providers:  IncredibleEmployment.be  This test is no t yet approved or cleared by the [place] FDA and  has been authorized for detection and/or diagnosis of SARS-CoV-2 by FDA under an Emergency Use Authorization (EUA). This EUA will remain  in effect (meaning this test can be used) for the duration of the COVID-19 declaration under Section 564(b)(1) of the Act, 21 U.S.C.section 360bbb-3(b)(1), unless the authorization is terminated   or revoked sooner.       Influenza A by PCR NEGATIVE NEGATIVE Final   Influenza B by PCR NEGATIVE NEGATIVE Final    Comment: (NOTE) The Xpert Xpress SARS-CoV-2/FLU/RSV plus assay is intended as an aid in the diagnosis of influenza from Nasopharyngeal swab specimens and should not be used as a sole basis for treatment. Nasal washings and aspirates are unacceptable for Xpert Xpress SARS-CoV-2/FLU/RSV testing.  Fact Sheet for Patients: EntrepreneurPulse.com.au  Fact Sheet for Healthcare Providers: IncredibleEmployment.be  This test is not yet approved or cleared by the [place] FDA and has been authorized for detection and/or diagnosis of SARS-CoV-2 by FDA under an Emergency Use Authorization (EUA). This EUA will remain in effect (meaning  this test can be used) for the duration of the COVID-19 declaration under Section 564(b)(1) of the Act, 21 U.S.C. section 360bbb-3(b)(1), unless the authorization is terminated or revoked.  Performed at [facility], [place] [address] [place], [place] [address]   CSF culture w Gram Stain     Status: None   Collection Time: 02/08/21  3:31 PM   Specimen: CSF; Cerebrospinal Fluid  Result Value Ref Range Status   Specimen Description CSF  Final   Special Requests NONE  Final   Gram Stain   Final    WBC PRESENT, PREDOMINANTLY MONONUCLEAR NO ORGANISMS SEEN CYTOSPIN SMEAR    Culture   Final    NO GROWTH 3 DAYS Performed at [facility], [place] [address], [place], [place] [address]    Report Status 02/12/2021 FINAL  Final  Culture, fungus without smear     Status: None (Preliminary result)   Collection Time: 02/08/21  3:31 PM   Specimen: CSF; Cerebrospinal Fluid  Result Value Ref Range Status   Specimen Description CSF  Final   Special Requests NONE  Final   Culture   Final    NO FUNGUS ISOLATED AFER 12 DAYS Performed at [facility], [place] [address] [place], [place] [address]     Report Status PENDING  Incomplete  VZV PCR, CSF     Status: None   Collection Time: 02/08/21  3:31 PM   Specimen: Cerebrospinal Fluid  Result Value Ref Range Status   VZV PCR, CSF Negative Negative Final    Comment: (NOTE) No Varicella Zoster Virus DNA detected. Performed At: [facility] [place], [place] [id number] Rush Farmer MD NO:[id number]   Culture, blood (routine x 2)     Status: None (Preliminary result)   Collection Time: 02/17/21  8:45 PM   Specimen: BLOOD RIGHT HAND  Result Value Ref Range Status   Specimen Description BLOOD RIGHT HAND  Final   Special Requests   Final    BOTTLES DRAWN AEROBIC AND ANAEROBIC Blood Culture adequate volume   Culture   Final    NO GROWTH 3 DAYS Performed at [facility], [place] [address], [place], [place] [address]    Report Status PENDING  Incomplete  Culture, blood (routine x 2)     Status: None (Preliminary result)   Collection Time: 02/17/21  8:45 PM   Specimen: BLOOD LEFT HAND  Result Value Ref Range Status   Specimen Description BLOOD LEFT HAND  Final   Special Requests   Final    BOTTLES DRAWN AEROBIC AND ANAEROBIC Blood Culture adequate volume   Culture   Final    NO GROWTH 3 DAYS Performed at [facility], [place] [address], [place], [place] [address]    Report Status PENDING  Incomplete  MRSA Next Gen by PCR, Nasal     Status: None   Collection Time: 02/19/21  4:03 PM   Specimen: Nasal Mucosa; Nasal Swab  Result Value Ref Range Status   MRSA by PCR Next Gen NOT DETECTED NOT DETECTED Final    Comment: (NOTE) The GeneXpert MRSA Assay (FDA approved for NASAL specimens only), is one component of a comprehensive MRSA colonization surveillance program. It is not intended to diagnose MRSA infection nor to guide or monitor treatment for MRSA infections. Test performance is not FDA approved in patients less than 47 years old. Performed at [facility], [place] [address] [place],  [place] [address]  Studies/Results: DG Chest 1 View  Result Date: 02/18/2021 CLINICAL DATA:  Epistaxis EXAM: CHEST  1 VIEW COMPARISON:  Chest radiograph from one day prior. FINDINGS: Stable cardiomediastinal silhouette with normal heart size. No pneumothorax. No pleural effusion. Lungs appear clear, with no acute consolidative airspace disease and no pulmonary edema. IMPRESSION: No active disease. Electronically Signed   By: Ilona Sorrel M.D.   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 EXAM: MRI PELVIS WITHOUT AND WITH CONTRAST TECHNIQUE: Multiplanar multisequence MR imaging of the pelvis was performed both before and after administration of intravenous contrast. CONTRAST:  62m GADAVIST GADOBUTROL 1 MMOL/ML IV SOLN COMPARISON:  02/13/2021 CT chest, abdomen and pelvis. 02/17/2021 pelvic sonogram. FINDINGS: Urinary Tract: Indwelling Foley catheter. Expected gas within the nondependent bladder. Otherwise normal bladder. Normal urethra. Bowel: Visualized small and large bowel are normal caliber with no bowel wall thickening. Vascular/Lymphatic: No pathologically enlarged lymph nodes in the pelvis. No acute vascular abnormality. Reproductive: Uterus: The normal size anteverted retroflexed uterus measures 6.5 x 2.6 x 3.3 cm. No uterine fibroids. Inner myometrium (junctional zone) measures 3 mm in thickness, which is normal. Endometrium measures 4 mm in bilayer thickness, which is normal. No endometrial cavity fluid or focal endometrial mass. Unremarkable uterine cervix. Ovaries and Adnexa: The right ovary measures 2.4 x 1.6 x 1.9 cm and is normal. The left ovary measures 2.4 x 1.5 x 1.9 cm and is normal. There are no abnormal ovarian or adnexal masses. Other: No abnormal free fluid in the pelvis. No focal pelvic fluid collection. Musculoskeletal: No aggressive appearing  focal osseous lesions. IMPRESSION: Normal pelvic MRI.  Symmetric normal ovaries. No adnexal masses. Electronically Signed   By: JIlona SorrelM.D.   On: 02/18/2021 20:03   DG CHEST PORT 1 VIEW  Result Date: 02/20/2021 CLINICAL DATA:  Encephalitis. EXAM: PORTABLE CHEST 1 VIEW COMPARISON:  02/19/2021 FINDINGS: The endotracheal tube remains in good position approximately 2.7 cm above the carina. The NG tube is coursing down the esophagus and into the stomach. Streaky basilar atelectasis but no infiltrates or effusions. No pneumothorax. IMPRESSION: 1. Support apparatus in good position. 2. Streaky bibasilar atelectasis. Electronically Signed   By: PMarijo SanesM.D.   On: 02/20/2021 11:35   Portable Chest x-ray  Result Date: 02/19/2021 CLINICAL DATA:  OG an NG tube EXAM: PORTABLE CHEST 1 VIEW COMPARISON:  02/19/2021 FINDINGS: Endotracheal tube tip is about 2.6 cm superior to carina. Esophageal tube tip below the diaphragm but incompletely visualized. No focal opacity, pleural effusion or pneumothorax. Normal cardiac size. IMPRESSION: 1. Endotracheal tube tip about 2.6 cm superior to carina. 2. Clear lung fields Electronically Signed   By: KDonavan FoilM.D.   On: 02/19/2021 16:48   DG CHEST PORT 1 VIEW  Result Date: 02/19/2021 CLINICAL DATA:  Shortness of breath EXAM: PORTABLE CHEST 1 VIEW COMPARISON:  Chest x-ray dated February 18, 2021 FINDINGS: The heart size and mediastinal contours are within normal limits. Both lungs are clear. The visualized skeletal structures are unremarkable. IMPRESSION: No active disease. Electronically Signed   By: LYetta GlassmanM.D.   On: 02/19/2021 11:57   DG Abd Portable 1V  Result Date: 02/19/2021 CLINICAL DATA:  OG tube placement EXAM: PORTABLE ABDOMEN - 1 VIEW COMPARISON:  02/17/2021 FINDINGS: Esophageal tube tip overlies distal stomach. Mild air distension of colon without obstruction. Moderate to large stool burden. IMPRESSION:  Esophageal tube tip overlies the  distal stomach Electronically Signed   By: Donavan Foil M.D.   On: 02/19/2021 16:50   Overnight EEG with video  Result Date: 02/20/2021 Lora Havens, MD     02/20/2021 10:58 AM Patient Name: Nainika Newlun MRN: [id number] Epilepsy Attending: Lora Havens Referring Physician/Provider: Dr Donnetta Simpers Duration: 02/19/2021 1628 to 02/20/2021 1100  Patient history:  20 y.o. female with PMH significant for Hashimoto thyroiditis who presents with vomiting, headache and not eating for 4 days and not talking since today. EEG to evaluate for seizure  Level of alertness: lethargic  AEDs during EEG study: LEV, VPA, Propofol  Technical aspects: This EEG study was done with scalp electrodes positioned according to the 10-20 International system of electrode placement. Electrical activity was acquired at a sampling rate of 500Hz and reviewed with a high frequency filter of 70Hz and a low frequency filter of 1Hz. EEG data were recorded continuously and digitally stored.  Description: EEG initially showed continuous generalized high amplitude rhythmic sharply contoured 2-3 Hz  delta slowing. Sharp transient were also noted in left posterior quadrant. Patient was noted to have intermittent left upper extremity jerking. Concomitant eeg showed similar high amplitude 2-3hz delta slowing with sharp transient in left posterior region. Propofol dose was adjusted. After around  2318 on 02/19/2021, EEG pattern evolved into continuous generalized polymorphic mixed frequencies with predominantly 3-6hz theta-delta slowing admixed with 15-18Hz fronto-central beta activity.  ABNORMALITY - Generalized rhythmic delta activity ( GRDA) - Continuous slow, generalized  IMPRESSION: This study is suggestive of severe diffuse encephalopathy, nonspecific etiology.  Patient was noted to have intermittent left upper extremity jerking without definite eeg change. However focal motor seizures may not be seen on scalp eeg. Clinical  correlation if recommended. Priyanka [name]      Assessment/Plan:  INTERVAL HISTORY:   She had a temperature over 103.5 on the 28th.  Epstein-Barr PCR from CSF positive   Principal Problem:   Encephalitis Active Problems:   Primary hypothyroidism   Catatonia associated with another mental disorder    Delancey Moraes is a 20 y.o. 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  Unfortunately she has not responded to corticosteroids and had seizures on IVIG.  Rituxan is being considered.  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  There is also some concern about the risk of EBV reactivation in the context of Rituxan but there is no way for [place] to mitigate that  risk with an antiviral   #2 HBV isolated core antibody with HBV DNA negative  She likely had prior infection which she is cleared.  That being this case given the high risk of reactivation if she has some occult hep B I believe she should be on HBV prophylaxis  I would like to use Vemlidy but we do not have it on formulary so I am starting Viread.   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 Lorrin Goodell and Dr. Lake Bells along with discussion with my colleague Dr. Baxter Flattery. I have finally reached out to Dr. Bradly Chris at [facility] and she and I will review as well.  Discussed case with Dr Frances Maywood who agrees with Dr. Baxter Flattery and myself that this DOES NOT seem at all consistent with EBV infection but she suggested getting EBV quant PCR on CSF and have put order in for this test if it can be added on and spoke to microbiology.  If it cannot be added on would get one the next time she does get a lumbar puncture.  She also recommended checking serologies which I ordered to try to determine the Epstein-Barr virus status of the patient.  This is now confounded though by the fact that she received IVIG and serologies will not just reflect the patient's immune system but that of donors as well.    She also thought the patient should get placed on tenofovir if she is going to be on Rituxan.  My understanding now is that plasmapheresis rather than Rituxan is being pursued to though it sounds like Rituxan very well may be used in the near future so I will leave that an off very on board so that restarting it is not neglected.  With regards to her indeterminate QuantiFERON she recommended repeating this test but again it may be confounded by the steroids and IVIG.     LOS: 13 days   Alcide Evener 02/20/2021, 12:35 PM

## 2021-02-20 NOTE — Progress Notes (Addendum)
Neurology contacted several times throughout the night about pts hypotension when on Propofol.  Neurology wants pt on 40 of Propofol.  CCM contacted to have vasopressor available if needed.  Will continue to monitor.

## 2021-02-20 NOTE — Progress Notes (Addendum)
Nutrition Follow-up  DOCUMENTATION CODES:   Not applicable  INTERVENTION:  Provide TF via Cortrak NGT with Osmolite 1.2 cal formula at new goal rate of 45 ml/h (1080 ml per day).  Prosource TF 45 ml TID per tube.    Tube feeding with current propofol rate to provide 1666 kcals, 93 gm protein, 886 ml free water daily.  NUTRITION DIAGNOSIS:   Inadequate oral intake related to acute illness (pt with AMS) as evidenced by meal completion < 25%; ongoing  GOAL:   Patient will meet greater than or equal to 90% of their needs; to be met with TF  MONITOR:   Vent status, TF tolerance, Skin, Weight trends, Labs, I & O's  REASON FOR ASSESSMENT:   Consult Enteral/tube feeding initiation and management  ASSESSMENT:   20 y.o. female with a PMHx of Hashimoto thyroiditis who is admitted with acute encephalopathy and catatonia.  Patient is currently intubated on ventilator support MV: 5.7 L/min Temp (24hrs), Avg:95.4 F (35.2 C), Min:92.7 F (33.7 C), Max:103.5 F (39.7 C) Propofol: 14 ml/hr which provides 370 kcal/day  Pt with seizures and change in mental status yesterday afternoon and was intubated overnight. RD consulted for tube feeding initiation and management while on ventilator. Per MD, clinical condition worsened while receiving steroids and IVIG. Rituximab held. Possible transfer to [facility] or other tertiary care center.   Labs and medications reviewed.   Diet Order:   Diet Order             Diet NPO time specified  Diet effective now                   EDUCATION NEEDS:   No education needs have been identified at this time  Skin:  Skin Assessment: Reviewed RN Assessment  Last BM:  PTA  Height:   Ht Readings from Last 1 Encounters:  02/19/21 5' 2"  (1.575 m) (18 %, Z= -0.90)*   * Growth percentiles are based on CDC (Girls, 2-20 Years) data.    Weight:   Wt Readings from Last 1 Encounters:  02/12/21 58.9 kg (54 %, Z= 0.11)*   * Growth percentiles are  based on CDC (Girls, 2-20 Years) data.    Ideal Body Weight:  50 kg  BMI:  Body mass index is 23.75 kg/m.  Estimated Nutritional Needs:   Kcal:  1400-1600  Protein:  90-110 grams  Fluid:  >/= 1.5 L/day  Corrin Parker, MS, RD, LDN RD pager number/after hours weekend pager number on Amion.

## 2021-02-20 NOTE — Progress Notes (Signed)
[facility] Progress Note Patient Name: Jamie [name] DOB: 2000/06/03 MRN: [id number]   Date of Service  02/20/2021  HPI/Events of Note  Patient with low grade fever.  eICU Interventions  PRN Tylenol ordered via NG tube for temperature > 100.5 degrees.        [name] [place] 02/20/2021, 10:59 PM

## 2021-02-20 NOTE — Progress Notes (Signed)
NAME:  Jamie [name], MRN:  [id number], DOB:  2000-09-12, LOS: 39 ADMISSION DATE:  02/06/2021, CONSULTATION DATE:  10/28 REFERRING MD:  Raymondo Band, CHIEF COMPLAINT:  Seizure, acute metabolic encephalopathy   History of Present Illness:  20 y/o female admitted with seizure and change in mental status.  She was treated with steroids and IVIg but despite this her condition worsened.  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  After treatment her mental status worsened and she developed inability to protect her airway in the setting of tachypnea and tachycardia.  She required intubation on 10/28.   Pertinent  Medical History  Acquired autoimmune hypothyroidism  Significant Hospital Events: Including procedures, antibiotic start and stop dates in addition to other pertinent events   10/16 Admit 10/25 New onset seizure activity, PCCM consult 1026 pulmonary critical care signed off 02/19/2021 pulmonary critical care reconsulted for suspected respiratory distress.  intubated, continuous EEG started   Micro/labs: 10/16 influenza AB PCR negative, SARS-CoV-2 PCR negative 10/16 HIV antibody negative 10/16 urine culture multiple species suggest recollection 10/16 urine drug screen negative, alcohol less than 10 10/17 CSF lymphocytic pleocytosis, fungal culture negative, CSF culture negative 10/17 CSF HSV 1 DNA negative, HSV-2 DNA negative, VZV PCR CSF negative 10/17 CSF cryptococcal antigen negative, cryptococcal antigen titer not indicated 10/17 CSF EBV positive  10/17 CMV qualitative CSF negative 10/17 NMDA IgG on CSF > pending 10/18 HIV RNA quant less than 20 10/20 NMDA IgG serum: positive 1:1000 10/22 RPR nonreactive 10/22 QuantiFERON gold intermediate 10/23 EBV DNA QuantiFERON by PCR positive, less than 35 10/25 hepatitis B surface antigen nonreactive, hepatitis B core total antibody positive, hepatitis B antibody positive, hepatitis B DNA not detected 10/25 hepatitis C  antibody negative 10/26 blood culture no growth to date  Imaging: 10/16 CT head negative 10/16 MRI brain incomplete study due to patient intolerance without evidence of acute intracranial abnormality 10/22 chest abdomen pelvis CT scan no acute intrathoracic abdominal or pelvic pathology, no evidence of malignancy 10/22 MRI brain normal MRI of the brain 10/26 pelvic ultrasound right ovary not visualized, normal appearance of left ovary and uterus, trace free fluid in pelvis 10/27 pelvic MRI: Normal pelvic MRI, symmetric normal ovaries, no adnexal masses  Procedures: 10/28 endotracheal tube 10/28 continuous EEG   Interim History / Subjective:  Intubated overnight Started on continuous EEG Started on low dose vasopressors while on propofol  Objective   Blood pressure 103/74, pulse 82, temperature (!) 93.9 F (34.4 C), temperature source Esophageal, resp. rate 16, height 5\' 2"  (1.575 m), weight 58.9 kg, last menstrual period 01/11/2021, SpO2 100 %.    Vent Mode: PRVC FiO2 (%):  [40 %-100 %] 40 % Set Rate:  [15 bmp] 15 bmp Vt Set:  [400 mL] 400 mL PEEP:  [5 cmH20] 5 cmH20 Plateau Pressure:  [12 cmH20-13 cmH20] 13 cmH20   Intake/Output Summary (Last 24 hours) at 02/20/2021 0835 Last data filed at 02/20/2021 0700 Gross per 24 hour  Intake 586.88 ml  Output 1100 ml  Net -513.12 ml   Filed Weights   02/12/21 1409  Weight: 58.9 kg    Examination: General:  In bed on vent HENT: NCAT ETT in place PULM: CTA B, vent supported breathing CV: RRR, no mgr GI: BS+, soft, nontender MSK: normal bulk and tone Neuro: sedated on vent   Resolved Hospital Problem list     Assessment & Plan:  Acute encephalopathy: 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  The significance of this is uncertain.  Is EBV unrelated?  Case reports in the literature report EBV positive CSF patients with an MD a receptor encephalitis however  they are immunocompromise patients unlike her. Hold rituximab as her clinical condition worsens while receiving treatment for NMDA receptor encephalitis (steroids and IVIG) unclear if she failed treatment or if this is the natural history of NMDA receptor encephalitis nfectious diseases consulted again Request transfer to [facility]  Acute respiratory failure with hypoxemia due to inability to protect airway Continue full mechanical ventilatory support Ventilator associated pneumonia prevention protocol Daily spontaneous breathing trials and wake-up assessments  Shock, due to propofol: Continue propofol for sedation Continue low-dose Neo-Synephrine titrated to mean arterial pressure greater than 65  Hypothyroidism Synthroid  Hypokalemia replace  Best Practice (right click and "Reselect all SmartList Selections" daily)   Diet/type: tubefeeds DVT prophylaxis: LMWH GI prophylaxis: PPI Lines: N/A Foley:  Yes, and it is still needed Code Status:  full code Last date of multidisciplinary goals of care discussion [10/29 with mother, full medical support desired, full code]  Labs   CBC: Recent Labs  Lab 02/16/21 0217 02/17/21 0420 02/18/21 0246 02/19/21 0334 02/19/21 1719 02/20/21 0636  WBC 9.8 8.9 7.6 7.6  --  6.6  NEUTROABS 8.0* 7.2 6.1 6.1  --  4.4  HGB 10.8* 10.7* 10.9* 10.8* 10.9* 11.6*  HCT 33.0* 32.4* 33.6* 33.6* 32.0* 35.8*  MCV 92.2 91.8 92.3 93.1  --  93.2  PLT 246 235 239 268  --  979    Basic Metabolic Panel: Recent Labs  Lab 02/14/21 0339 02/15/21 0354 02/16/21 0217 02/16/21 1630 02/17/21 0420 02/18/21 0246 02/19/21 0334 02/19/21 1719  NA 135 134* 128* 134* 130* 128* 132* 141  K 3.6 3.7 4.1 3.8 3.6 3.8 3.8 3.4*  CL 103 104 96* 102 99 98 100  --   CO2 26 24 24 27 24 25 26   --   GLUCOSE 189* 121* 99 100* 85 131* 101*  --   BUN 19 21* 18 15 17 18 20   --   CREATININE 0.67 0.63 0.72 0.57 0.63 0.60 0.59  --   CALCIUM 8.6* 8.4* 8.4*  8.7* 8.7* 8.7* 8.9  --   MG 2.5* 2.1 1.8  --  2.0 2.2 2.3  --   PHOS 2.6 1.9* 2.1*  --  3.5  --  3.5  --    GFR: Estimated Creatinine Clearance: 89.5 mL/min (by C-G formula based on SCr of 0.59 mg/dL). Recent Labs  Lab 02/17/21 0420 02/18/21 0246 02/19/21 0334 02/20/21 0636  WBC 8.9 7.6 7.6 6.6    Liver Function Tests: Recent Labs  Lab 02/14/21 0339 02/15/21 0354 02/16/21 0217 02/17/21 0420  AST 18 14* 19 20  ALT 13 13 13 14   ALKPHOS 33* 30* 28* 28*  BILITOT 0.8 0.4 0.8 1.3*  PROT 6.8 7.1 7.4 8.5*  ALBUMIN 3.0* 2.6* 2.7* 2.6*   No results for input(s): LIPASE, AMYLASE in the last 168 hours. No results for input(s): AMMONIA in the last 168 hours.  ABG    Component Value Date/Time   PHART 7.505 (H) 02/19/2021 1719   PCO2ART 37.7 02/19/2021 1719   PO2ART 280 (H) 02/19/2021 1719   HCO3 29.6 (H) 02/19/2021 1719   TCO2 31 02/19/2021 1719   O2SAT 100.0 02/19/2021 1719     Coagulation Profile: No results for input(s): INR, PROTIME in the last 168 hours.  Cardiac Enzymes: Recent Labs  Lab  02/16/21 0217  CKTOTAL 169    HbA1C: No results found for: HGBA1C  CBG: Recent Labs  Lab 02/19/21 1616 02/19/21 1925 02/19/21 2324 02/20/21 0344 02/20/21 [place]    Critical care time: 60 minutes    Roselie Awkward, MD [place] [facility] Pager: [phone] Cell: [phone] After 7:00 pm call Elink  [phone]

## 2021-02-20 NOTE — Progress Notes (Signed)
[facility] PCCM  Discussed extensively today with neurology, ID, and neuro-intensivist service at [facility]  Plan plasmapheresis.  Hemodialysis Catheter placed. Mother updated.  Roselie Awkward, MD [place] [facility] Pager: [phone] Cell: [phone] After 7:00 pm call Elink  [phone]

## 2021-02-21 DIAGNOSIS — G0490 Encephalitis and encephalomyelitis, unspecified: Secondary | ICD-10-CM | POA: Diagnosis not present

## 2021-02-21 DIAGNOSIS — F061 Catatonic disorder due to known physiological condition: Secondary | ICD-10-CM | POA: Diagnosis not present

## 2021-02-21 DIAGNOSIS — E039 Hypothyroidism, unspecified: Secondary | ICD-10-CM | POA: Diagnosis not present

## 2021-02-21 LAB — COMPREHENSIVE METABOLIC PANEL
ALT: 16 U/L (ref 0–44)
AST: 40 U/L (ref 15–41)
Albumin: 2.2 g/dL — ABNORMAL LOW (ref 3.5–5.0)
Alkaline Phosphatase: 24 U/L — ABNORMAL LOW (ref 38–126)
Anion gap: 6 (ref 5–15)
BUN: 12 mg/dL (ref 6–20)
CO2: 22 mmol/L (ref 22–32)
Calcium: 7.6 mg/dL — ABNORMAL LOW (ref 8.9–10.3)
Chloride: 108 mmol/L (ref 98–111)
Creatinine, Ser: 0.44 mg/dL (ref 0.44–1.00)
GFR, Estimated: >60 mL/min (ref >60)
Glucose, Bld: 85 mg/dL (ref 70–99)
Potassium: 4.2 mmol/L (ref 3.5–5.1)
Sodium: 136 mmol/L (ref 135–145)
Total Bilirubin: 0.8 mg/dL (ref 0.3–1.2)

## 2021-02-21 LAB — CBC WITH DIFFERENTIAL/PLATELET
Abs Immature Granulocytes: 0.03 10*3/uL (ref 0.00–0.07)
Basophils Absolute: 0 10*3/uL (ref 0.0–0.1)
Basophils Relative: 0 %
Eosinophils Absolute: 0.1 10*3/uL (ref 0.0–0.5)
Eosinophils Relative: 1 %
HCT: 29.4 % — ABNORMAL LOW (ref 36.0–46.0)
Hemoglobin: 9.7 g/dL — ABNORMAL LOW (ref 12.0–15.0)
Immature Granulocytes: 1 %
Lymphocytes Relative: 13 %
Lymphs Abs: 0.7 10*3/uL (ref 0.7–4.0)
MCH: 31.2 pg (ref 26.0–34.0)
MCHC: 33 g/dL (ref 30.0–36.0)
MCV: 94.5 fL (ref 80.0–100.0)
Monocytes Absolute: 0.6 10*3/uL (ref 0.1–1.0)
Monocytes Relative: 10 %
Neutro Abs: 4.1 10*3/uL (ref 1.7–7.7)
Neutrophils Relative %: 75 %
Platelets: 236 10*3/uL (ref 150–400)
RBC: 3.11 MIL/uL — ABNORMAL LOW (ref 3.87–5.11)
RDW: 13.8 % (ref 11.5–15.5)
WBC: 5.4 10*3/uL (ref 4.0–10.5)
nRBC: 0 % (ref 0.0–0.2)

## 2021-02-21 LAB — GLUCOSE, CAPILLARY
Glucose-Capillary: 95 mg/dL (ref 70–99)
Glucose-Capillary: 86 mg/dL (ref 70–99)
Glucose-Capillary: 105 mg/dL — ABNORMAL HIGH (ref 70–99)
Glucose-Capillary: 103 mg/dL — ABNORMAL HIGH (ref 70–99)

## 2021-02-21 MED ORDER — CALCIUM GLUCONATE-NACL 2-0.675 GM/100ML-% IV SOLN
2.0000 g | Freq: Once | INTRAVENOUS | Status: AC
  Administered 2021-02-21: 2000 mg via INTRAVENOUS
  Filled 2021-02-21: qty 100

## 2021-02-21 MED ORDER — HEPARIN SODIUM (PORCINE) 1000 UNIT/ML IJ SOLN
1000.0000 [IU] | Freq: Once | INTRAMUSCULAR | Status: DC
  Filled 2021-02-21: qty 1

## 2021-02-21 MED ORDER — SODIUM CHLORIDE 0.9 % IV SOLN
INTRAVENOUS | Status: AC
  Filled 2021-02-21 (×3): qty 200

## 2021-02-21 MED ORDER — ACD FORMULA A 0.73-2.45-2.2 GM/100ML VI SOLN
1000.0000 mL | Status: DC
  Administered 2021-02-21: 1000 mL
  Filled 2021-02-21: qty 1000

## 2021-02-21 MED ORDER — DIPHENHYDRAMINE HCL 25 MG PO CAPS: 25.0000 mg | ORAL_CAPSULE | Freq: Four times a day (QID) | ORAL | Status: DC | PRN

## 2021-02-21 MED ORDER — ACETAMINOPHEN 325 MG PO TABS
650.0000 mg | ORAL_TABLET | ORAL | Status: DC | PRN
  Administered 2021-02-22 (×2): 650 mg
  Filled 2021-02-21: qty 2

## 2021-02-21 NOTE — Progress Notes (Addendum)
NEUROLOGY CONSULTATION PROGRESS NOTE   Date of service: February 21, 2021 Patient Name: Jamie [name] MRN:  [id number] DOB:  04-22-2001  Brief HPI  Jamie [name] is a 20 y.o. 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 intake for 4 days and decreased verbalization 10/15 prior to arrival. Initial work-up revealed a mildly elevated creatinine, TSH of 10.09, negative ETOH, and an abnormally elevated thyroperoxidase Ab of 268.  Extensive workup with MRI Brain normal, LP with lymphocytic pleocytosis, mildly elevated protein, elevated IgG index with negative cultures, negative HSV, VZV.  Her NMDA Receptor IgG elevated to > 1:2560. 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  She is s/p 5 days of IV Solumedrol and 5 doses of IVIG with no significant improvement. Plan for PLEX.   Interval Hx   HD cath placed yesterday afternoon. PLEX ordered and plan to start today in AM. My discussion with Dialysis RN was that they plan to do today around 0800.  Vitals   Vitals:   02/21/21 0630 02/21/21 0645 02/21/21 0700 02/21/21 0800  BP: 106/74 103/69 101/68 100/70  Pulse: (!) 116 (!) 117 (!) 114 (!) 109  Resp: 19 19 16 17   Temp: (!) 100.8 F (38.2 C) (!) 100.6 F (38.1 C) (!) 100.4 F (38 C) 99.7 F (37.6 C)  TempSrc:      SpO2: 100% 100% 100% 100%  Weight:      Height:         Body mass index is 23.75 kg/m.  Physical Exam on Propofol   General: Laying comfortably in bed; intubated and sedated on propofol HENT: Normal oropharynx and mucosa. Normal external appearance of ears and nose.  Neck: Supple, no pain or tenderness  CV: No JVD. No peripheral edema.  Pulmonary: Symmetric Chest rise. Normal respiratory effort.  Abdomen: Soft to touch, non-tender.  Ext: No cyanosis, edema, or deformity  Skin: No rash. Normal palpation of skin.   Musculoskeletal: Normal digits and nails by inspection.  No clubbing.   Neurologic Examination  Mental status/Cognition: intubated, eyes closed, no reponse to loud voice or clap. Grimaces to noxious stimuli.   Brainstem reflexes: Pupils: 50mm BL, equal round and reactive to light Cough: intact Gag: could not elicit Symeetric face with no droop.  Motor:  Muscle bulk: poor, tone flaccid in all extremities. Minimal movement today. No response to pinch.  Reflexes:  Right Left Comments  Pectoralis      Biceps (C5/6) 1 1   Brachioradialis (C5/6) 1 1    Triceps (C6/7) 1 1    Patellar (L3/4) 0 0    Achilles (S1) 1 1    Hoffman      Plantar mute mute   Jaw jerk    Sensation: No grimace today to pain.  Coordination/Complex Motor:  Unable to assess coordination.  Labs   Basic Metabolic Panel:  Lab Results  Component Value Date   NA 140 02/20/2021   K 2.8 (L) 02/20/2021   CO2 22 02/20/2021   GLUCOSE 89 02/20/2021   BUN 17 02/20/2021   CREATININE 0.62 02/20/2021   CALCIUM 8.9 02/20/2021   GFRNONAA >60 02/20/2021   HbA1c: No results found for: HGBA1C LDL:  Lab Results  Component Value Date   LDLCALC 62 02/09/2021   Urine Drug Screen:     Component Value Date/Time   LABOPIA NONE DETECTED 02/07/2021 0600   COCAINSCRNUR NONE DETECTED 02/07/2021 0600   LABBENZ NONE DETECTED 02/07/2021  0600   AMPHETMU NONE DETECTED 02/07/2021 0600   THCU NONE DETECTED 02/07/2021 0600   LABBARB NONE DETECTED 02/07/2021 0600    Alcohol Level     Component Value Date/Time   ETH <10 02/06/2021 1435   No results found for: PHENYTOIN, ZONISAMIDE, LAMOTRIGINE, LEVETIRACETA Lab Results  Component Value Date   VALPROATE 128 (H) 02/20/2021    Imaging and Diagnostic studies   MR BRAIN W/WO CONTRAST Officialy read as normal MRI of brain but there is some concern for subtle enhancement of the posterior colliculi on axial post contrast imaging.  N-methyl-D-Aspartate Recpt.IgG >1:2560   CSF IgG index: 1.4  Oligoclonal Bands: Three (3)  oligoclonal bands were observed in the CSF, which were not detected in the serum sample.   CSF WBC: 318, 94% lymphocytes.  CSF Protein: 50  CSF Glucose: 68.  CSF culture with gram stain: PMN seen but no organisms seen on smear, no growth so far. CSF Fungal Cx: NO FUNGUS ISOLATED AFTER 7 DAYS.  Serum HIV 1/2: non reactive.  CSF Cryptococcus Ag: negative  CMV CSF: negative  HSV 1/2 PCR CSF: Negative  VZV PCR CSF: negative.  EBV DNA QN by PCR: Positive <35  HBV sAg: non reactive, HBV Core Ab: reactive. HBV DNA PCR: not detected.   Impression   Jamie [name] is a 20 y.o. 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 intake for 4 days and decreased verbalization 10/15 prior to arrival. Initial concerns for Hashimoto's encephalitis but extensive workup with CSF pleocytosis, positive NMDA receptor IgG, elevated IgG index and 3 oligoclonal bands in the CSF that are not noted in the serum. R frontal seizure on cEEG on 10/25, has not had any since she has been on Keppra 1000mg  BID. 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 She completed 5 days of high dose IV Solumedrol and completed IVIG x 5 doses.   PLEX starting today and if that does not help, will start Rituximab.  Recommendations  - Completed IVMP 1G Q24 hours x 5 doses and IVIG x 5 doses. - HD Cath placed, PLEX planned for today. Will do 5 session and if she does not improve, will do Rituximab. 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 Hep B DNA negative. EBV PCR CSF is positive but quantitative PCR < 35. - Continue Keppra 1500g BID, Valproid acid 500 TID and  Propofol 26mcg/Kg/min. - MR Pelvis w + w/o C is negative for an Ovarian teratoma. __________________________________________________________________  This patient is critically ill and at significant risk of neurological worsening, death and care requires constant monitoring of vital signs, hemodynamics,respiratory and cardiac monitoring, neurological assessment, discussion with family, other specialists and medical decision making of high complexity. I spent 60 minutes of neurocritical care time  in the care of  this patient. This was time spent independent of any time provided by nurse practitioner or PA.  [place] Pager Number [phone] 02/21/2021  9:07 AM   Plan discussed with patient's mother at bedside and with primary team.  Thank you for the opportunity to take part in the care of this patient. If you have any further questions, please contact the neurology consultation attending.  Signed,  [place] Pager Number [phone]

## 2021-02-21 NOTE — Progress Notes (Signed)
NAME:  Jamie [name], MRN:  [id number], DOB:  07/18/2000, LOS: 53 ADMISSION DATE:  02/06/2021, CONSULTATION DATE:  10/28 REFERRING MD:  Raymondo Band, CHIEF COMPLAINT:  Seizure, acute metabolic encephalopathy   History of Present Illness:  20 y/o female admitted with seizure and change in mental status.  She was treated with steroids and IVIg but despite this her condition worsened.  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  After treatment her mental status worsened and she developed inability to protect her airway in the setting of tachypnea and tachycardia.  She required intubation on 10/28.   Pertinent  Medical History  Acquired autoimmune hypothyroidism  Significant Hospital Events: Including procedures, antibiotic start and stop dates in addition to other pertinent events   10/16 Admit 10/25 New onset seizure activity, PCCM consult 1026 pulmonary critical care signed off 02/19/2021 pulmonary critical care reconsulted for suspected respiratory distress.  intubated, continuous EEG started 10/30 starting plasmapheresis  Micro/labs: 10/16 influenza AB PCR negative, SARS-CoV-2 PCR negative 10/16 HIV antibody negative 10/16 urine culture multiple species suggest recollection 10/16 urine drug screen negative, alcohol less than 10 10/17 CSF lymphocytic pleocytosis, fungal culture negative, CSF culture negative 10/17 CSF HSV 1 DNA negative, HSV-2 DNA negative, VZV PCR CSF negative 10/17 CSF cryptococcal antigen negative, cryptococcal antigen titer not indicated 10/17 CSF EBV positive  10/17 CMV qualitative CSF negative 10/17 NMDA IgG on CSF > 1:2560 10/18 HIV RNA quant less than 20 10/20 NMDA IgG serum: positive 1:1000 10/22 RPR nonreactive 10/22 QuantiFERON gold intermediate 10/23 EBV DNA QuantiFERON by PCR positive, less than 35 10/25 hepatitis B surface antigen nonreactive, hepatitis B core total antibody positive, hepatitis B antibody positive, hepatitis B DNA not  detected 10/25 hepatitis C antibody negative 10/26 blood culture no growth to date  Imaging: 10/16 CT head negative 10/16 MRI brain incomplete study due to patient intolerance without evidence of acute intracranial abnormality 10/22 chest abdomen pelvis CT scan no acute intrathoracic abdominal or pelvic pathology, no evidence of malignancy 10/22 MRI brain normal MRI of the brain 10/26 pelvic ultrasound right ovary not visualized, normal appearance of left ovary and uterus, trace free fluid in pelvis 10/27 pelvic MRI: Normal pelvic MRI, symmetric normal ovaries, no adnexal masses  Procedures: 10/28 endotracheal tube >  10/28 continuous EEG  10/29 R IJ HD catheter >   Interim History / Subjective:   Remains mechanically ventilated No acute events  Objective   Blood pressure 103/69, pulse (!) 117, temperature (!) 100.6 F (38.1 C), resp. rate 19, height 5\' 2"  (1.575 m), weight 58.9 kg, last menstrual period 01/11/2021, SpO2 100 %.    Vent Mode: PRVC FiO2 (%):  [30 %-40 %] 30 % Set Rate:  [15 bmp] 15 bmp Vt Set:  [400 mL] 400 mL PEEP:  [5 cmH20] 5 cmH20 Plateau Pressure:  [13 cmH20-14 cmH20] 14 cmH20   Intake/Output Summary (Last 24 hours) at 02/21/2021 7829 Last data filed at 02/21/2021 0600 Gross per 24 hour  Intake 2576.75 ml  Output 1140 ml  Net 1436.75 ml   Filed Weights   02/12/21 1409  Weight: 58.9 kg    Examination:  General:  In bed on vent HENT: NCAT ETT in place PULM: CTA B, vent supported breathing CV: RRR, no mgr GI: BS+, soft, nontender MSK: normal bulk and tone Neuro: sedated on vent   Resolved Hospital Problem list     Assessment & Plan:  Acute encephalopathy: 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   Discussed transfer to [facility] neuro-intensive care unit on 10/29, they recommend initiation of plasmapheresis followed by evaluation of clinical response, consideration of  ECT. Plasmapheresis to start today Would administer rituximab after plasma pheresis is complete Continue PAD protocol with propofol for ventilator synchrony, titrated  Depakote per neurology Consider ECT?   Acute respiratory failure with hypoxemia due to inability to protect airway Continue full mechanical ventilator support Ventilator associated prevention protocol Daily SBT  Shock, due to propofol: Continue propofol for sedation Continue low-dose neo-synephrine titrated to mean arterial pressure greater than 65  Hypothyroidism Synthroid  Hypokalemia Replace  Best Practice (right click and "Reselect all SmartList Selections" daily)   Diet/type: tubefeeds DVT prophylaxis: LMWH GI prophylaxis: PPI Lines: N/A Foley:  Yes, and it is still needed Code Status:  full code Last date of multidisciplinary goals of care discussion [10/29 with mother, full medical support desired, full code]  Labs   CBC: Recent Labs  Lab 02/16/21 0217 02/17/21 0420 02/18/21 0246 02/19/21 0334 02/19/21 1719 02/20/21 0636  WBC 9.8 8.9 7.6 7.6  --  6.6  NEUTROABS 8.0* 7.2 6.1 6.1  --  4.4  HGB 10.8* 10.7* 10.9* 10.8* 10.9* 11.6*  HCT 33.0* 32.4* 33.6* 33.6* 32.0* 35.8*  MCV 92.2 91.8 92.3 93.1  --  93.2  PLT 246 235 239 268  --  413    Basic Metabolic Panel: Recent Labs  Lab 02/15/21 0354 02/16/21 0217 02/16/21 1630 02/17/21 0420 02/18/21 0246 02/19/21 0334 02/19/21 1719 02/20/21 0636  NA 134* 128* 134* 130* 128* 132* 141 140  K 3.7 4.1 3.8 3.6 3.8 3.8 3.4* 2.8*  CL 104 96* 102 99 98 100  --  108  CO2 24 24 27 24 25 26   --  22  GLUCOSE 121* 99 100* 85 131* 101*  --  89  BUN 21* 18 15 17 18 20   --  17  CREATININE 0.63 0.72 0.57 0.63 0.60 0.59  --  0.62  CALCIUM 8.4* 8.4* 8.7* 8.7* 8.7* 8.9  --  8.9  MG 2.1 1.8  --  2.0 2.2 2.3  --   --   PHOS 1.9* 2.1*  --  3.5  --  3.5  --   --    GFR: Estimated Creatinine Clearance: 89.5 mL/min (by C-G formula based on SCr of 0.62  mg/dL). Recent Labs  Lab 02/17/21 0420 02/18/21 0246 02/19/21 0334 02/20/21 0636  WBC 8.9 7.6 7.6 6.6    Liver Function Tests: Recent Labs  Lab 02/15/21 0354 02/16/21 0217 02/17/21 0420 02/20/21 0636  AST 14* 19 20 29   ALT 13 13 14 16   ALKPHOS 30* 28* 28* 29*  BILITOT 0.4 0.8 1.3* 0.9  PROT 7.1 7.4 8.5* 8.9*  ALBUMIN 2.6* 2.7* 2.6* 2.8*   No results for input(s): LIPASE, AMYLASE in the last 168 hours. No results for input(s): AMMONIA in the last 168 hours.  ABG    Component Value Date/Time   PHART 7.505 (H) 02/19/2021 1719   PCO2ART 37.7 02/19/2021 1719   PO2ART 280 (H) 02/19/2021 1719   HCO3 29.6 (H) 02/19/2021 1719   TCO2 31 02/19/2021 1719   O2SAT 100.0 02/19/2021 1719     Coagulation Profile: No results for input(s): INR, PROTIME in the last 168 hours.  Cardiac Enzymes: Recent Labs  Lab 02/16/21 0217  CKTOTAL 169    HbA1C: No results found for: HGBA1C  CBG: Recent Labs  Lab 02/20/21 1654 02/20/21 1918 02/20/21  2351 02/21/21 0346 02/21/21 0712  GLUCAP 78 81 97 95 86    Critical care time: 32 minutes    Roselie Awkward, MD [place] [facility] Pager: [phone] Cell: [phone] After 7:00 pm call Elink  [phone]

## 2021-02-21 NOTE — Progress Notes (Signed)
LTM checked. No skin breakdown noted. Impedances good. Results pending.

## 2021-02-21 NOTE — Progress Notes (Addendum)
I called dialysis [phone]. No answer at this time.  Update: Plan for bedside PLEX discussed with Vo RN of HD.

## 2021-02-21 NOTE — Progress Notes (Addendum)
Pt having irreg arm movements. Pupils 63mm and disconjugate. MD paged, EEG button pressed.   Irreg mvmts stopped at 1415. Discussed with neurology.

## 2021-02-21 NOTE — Progress Notes (Signed)
Temp lowering, bair hugger applied. Cont to monitor.

## 2021-02-21 NOTE — Procedures (Addendum)
Patient Name: Jamie [name]  MRN: [id number]  Epilepsy Attending: Lora Havens  Referring Physician/Provider: Dr Donnetta Simpers Duration: 02/20/2021 1628 to 02/21/2021 1628   Patient history:  20 y.o. female with PMH significant for Hashimoto thyroiditis who presents with vomiting, headache and not eating for 4 days and not talking since today. EEG to evaluate for seizure   Level of alertness: lethargic   AEDs during EEG study: LEV, VPA, Propofol   Technical aspects: This EEG study was done with scalp electrodes positioned according to the 10-20 International system of electrode placement. Electrical activity was acquired at a sampling rate of 500Hz  and reviewed with a high frequency filter of 70Hz  and a low frequency filter of 1Hz . EEG data were recorded continuously and digitally stored.    Description: EEG showed continuous generalized predominantly 1-3Hz  delta slowing with overriding 15-18Hz  fronto-central beta activity. At times 5-6hz  generalized theta slowing was also noted.  At times patient was also noted to have generalized, maximal bilateral posterior quadrant, periodic discharges with overriding fast activity ( GPD+) at 1 Hz.  Patient event button was pressed on 02/21/2021 at 1400. Per RN, patient had bilateral (left more than right) upper extremity twitching.  Concomitant eeg showed continuous generalized high amplitude 1 to 3 Hz-delta slowing with overriding 15-18Hz  fronto-central beta activity ( extreme delta brush).    ABNORMALITY - Periodic discharges with overriding fast activity, generalized ( GPD+) - Delta brush, generalized   IMPRESSION: This study showed evidence of epileptogenicity with generalized onset, maximal bilateral posterior quadrant.  Additionally there is severe diffuse encephalopathy, nonspecific etiology but likely secondary to NMDA encephalitis as well as sedation  Patient event button was pressed on 02/21/2021 at 1400. Per RN, patient had  bilateral (left more than right) upper extremity twitching.  No definitive EEG change was seen during the episode. However, focal motor seizures may not be seen on scalp eeg.  Clinical correlation is recommended.   [name] [name]

## 2021-02-22 ENCOUNTER — Inpatient Hospital Stay (HOSPITAL_COMMUNITY): Payer: Medicaid Other

## 2021-02-22 DIAGNOSIS — G0490 Encephalitis and encephalomyelitis, unspecified: Secondary | ICD-10-CM | POA: Diagnosis not present

## 2021-02-22 DIAGNOSIS — J988 Other specified respiratory disorders: Secondary | ICD-10-CM

## 2021-02-22 DIAGNOSIS — G0481 Other encephalitis and encephalomyelitis: Secondary | ICD-10-CM | POA: Diagnosis not present

## 2021-02-22 LAB — EPSTEIN-BARR VIRUS (EBV) ANTIBODY PROFILE
EBV NA IgG: >600 U/mL — ABNORMAL HIGH (ref 0.0–17.9)
EBV VCA IgG: >600 U/mL — ABNORMAL HIGH (ref 0.0–17.9)
EBV VCA IgM: <36 U/mL (ref 0.0–35.9)

## 2021-02-22 LAB — VALPROIC ACID LEVEL: Valproic Acid Lvl: 68 ug/mL (ref 50.0–100.0)

## 2021-02-22 LAB — CULTURE, BLOOD (ROUTINE X 2)
Culture: NO GROWTH
Culture: NO GROWTH
Special Requests: ADEQUATE
Special Requests: ADEQUATE

## 2021-02-22 LAB — GLUCOSE, CAPILLARY
Glucose-Capillary: 104 mg/dL — ABNORMAL HIGH (ref 70–99)
Glucose-Capillary: 138 mg/dL — ABNORMAL HIGH (ref 70–99)
Glucose-Capillary: 81 mg/dL (ref 70–99)
Glucose-Capillary: 87 mg/dL (ref 70–99)
Glucose-Capillary: 90 mg/dL (ref 70–99)
Glucose-Capillary: 92 mg/dL (ref 70–99)
Glucose-Capillary: 97 mg/dL (ref 70–99)

## 2021-02-22 MED ORDER — GADOBUTROL 1 MMOL/ML IV SOLN
6.0000 mL | Freq: Once | INTRAVENOUS | Status: AC | PRN
  Administered 2021-02-22: 6 mL via INTRAVENOUS

## 2021-02-22 MED ORDER — SENNOSIDES-DOCUSATE SODIUM 8.6-50 MG PO TABS
1.0000 | ORAL_TABLET | Freq: Two times a day (BID) | ORAL | Status: DC
  Administered 2021-02-22: 1 via NASOGASTRIC
  Filled 2021-02-22 (×2): qty 1

## 2021-02-22 MED ORDER — BISACODYL 10 MG RE SUPP
10.0000 mg | Freq: Once | RECTAL | Status: AC
  Administered 2021-02-22: 10 mg via RECTAL
  Filled 2021-02-22: qty 1

## 2021-02-22 MED ORDER — CLONAZEPAM 0.25 MG PO TBDP
0.5000 mg | ORAL_TABLET | Freq: Two times a day (BID) | ORAL | Status: DC
  Administered 2021-02-22 – 2021-02-25 (×7): 0.5 mg
  Filled 2021-02-22 (×7): qty 2

## 2021-02-22 MED ORDER — LORAZEPAM 2 MG/ML IJ SOLN
INTRAMUSCULAR | Status: AC
  Filled 2021-02-22: qty 1

## 2021-02-22 MED ORDER — POLYETHYLENE GLYCOL 3350 17 G PO PACK
17.0000 g | PACK | Freq: Two times a day (BID) | ORAL | Status: DC
  Administered 2021-02-22 – 2021-03-13 (×23): 17 g
  Filled 2021-02-22 (×28): qty 1

## 2021-02-22 NOTE — Progress Notes (Signed)
Pt's LTM EEG was d/c for MRI and will be re hooked. Pt was noted to have skin breakdown A1 C3 F4.

## 2021-02-22 NOTE — Procedures (Addendum)
Patient Name: Jamie [name]  MRN: [id number]  Epilepsy Attending: Lora Havens  Referring Physician/Provider: Dr Donnetta Simpers Duration: 02/21/2021 1628 to 02/22/2021 1645   Patient history:  20 y.o. female with PMH significant for Hashimoto thyroiditis who presents with vomiting, headache and not eating for 4 days and not talking since today. EEG to evaluate for seizure   Level of alertness: lethargic   AEDs during EEG study: LEV, VPA, Clonazepam, Propofol   Technical aspects: This EEG study was done with scalp electrodes positioned according to the 10-20 International system of electrode placement. Electrical activity was acquired at a sampling rate of 500Hz  and reviewed with a high frequency filter of 70Hz  and a low frequency filter of 1Hz . EEG data were recorded continuously and digitally stored.    Description: EEG showed continuous generalized predominantly 1-3Hz  rhythmic delta slowing with overriding 15-18Hz  fronto-central beta activity. At times 5-6hz  generalized theta slowing was also noted.  At times patient was also noted to have generalized, maximal bilateral posterior quadrant, periodic discharges with overriding fast activity ( GPD+) at 1 Hz.   Patient event button was pressed on 02/21/2021 at 2355. Patient had left upper extremity twitching, right upper extremity was difficult to visualize on camera.  Concomitant eeg showed continuous generalized high amplitude 1 to 3 Hz-delta slowing with overriding 15-18Hz  fronto-central beta activity with some evolution in frequency.   ABNORMALITY - Periodic discharges with overriding fast activity, generalized ( GPD+) - Delta brush, generalized   IMPRESSION: This study showed evidence of epileptogenicity with generalized onset, maximal bilateral posterior quadrant.  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 Patient had left upper extremity twitching, right upper extremity was difficult to visualize on camera. No definitive EEG change was seen during the episode. However, focal motor seizures may not be seen on scalp eeg.  Clinical correlation is recommended.   [name] [name]

## 2021-02-22 NOTE — Progress Notes (Signed)
LTM maint complete - no skin breakdown under:  CZ C4 Fz

## 2021-02-22 NOTE — Progress Notes (Signed)
Pt transported to MRI and back to 4N 28 on full vent support. No complications noted.

## 2021-02-22 NOTE — Progress Notes (Signed)
Subjective: Had 2 more episodes of left more than right upper extremity twitching overnight without definite EEG correlate.  However, the rhythmic nature and the semiology of the episodes makes it concerning for focal seizures  ROS: Unable to obtain due to poor mental status  Examination  Vital signs in last 24 hours: Temp:  [97.9 F (36.6 C)-100.9 F (38.3 C)] 100.2 F (37.9 C) (10/31 1138) Pulse Rate:  [77-120] 90 (10/31 1138) Resp:  [13-23] 15 (10/31 1138) BP: (83-145)/(50-88) 96/55 (10/31 1100) SpO2:  [100 %] 100 % (10/31 1138) FiO2 (%):  [30 %] 30 % (10/31 1138)  General: lying in bed, NAD CVS: pulse-normal rate and rhythm RS: Intubated, CTAB Extremities: normal, warm Neuro: On propofol @40mcg /hr, comatose, does not open eyes to noxious stimuli, disconjugate gaze, pupils reacting to light, corneal reflex intact, gag reflex intact, does not withdraw to noxious stimuli in left upper extremity, 1/5 in right upper extremity, I did not see any definite movement to noxious stimuli in bilateral lower extremities either, flaccid, areflexic  Basic Metabolic Panel: Recent Labs  Lab 02/16/21 0217 02/16/21 1630 02/17/21 0420 02/18/21 0246 02/19/21 0334 02/19/21 1719 02/20/21 0636 02/21/21 0700  NA 128*   < > 130* 128* 132* 141 140 136  K 4.1   < > 3.6 3.8 3.8 3.4* 2.8* 4.2  CL 96*   < > 99 98 100  --  108 108  CO2 24   < > 24 25 26   --  22 22  GLUCOSE 99   < > 85 131* 101*  --  89 85  BUN 18   < > 17 18 20   --  17 12  CREATININE 0.72   < > 0.63 0.60 0.59  --  0.62 0.44  CALCIUM 8.4*   < > 8.7* 8.7* 8.9  --  8.9 7.6*  MG 1.8  --  2.0 2.2 2.3  --   --   --   PHOS 2.1*  --  3.5  --  3.5  --   --   --    < > = values in this interval not displayed.    CBC: Recent Labs  Lab 02/17/21 0420 02/18/21 0246 02/19/21 0334 02/19/21 1719 02/20/21 0636 02/21/21 0700  WBC 8.9 7.6 7.6  --  6.6 5.4  NEUTROABS 7.2 6.1 6.1  --  4.4 4.1  HGB 10.7* 10.9* 10.8* 10.9* 11.6* 9.7*  HCT  32.4* 33.6* 33.6* 32.0* 35.8* 29.4*  MCV 91.8 92.3 93.1  --  93.2 94.5  PLT 235 239 268  --  249 236     Coagulation Studies: No results for input(s): LABPROT, INR in the last 72 hours.  Imaging MRI brain with and without contrast 02/13/2021: Normal MRI brain  ASSESSMENT AND PLAN: 20 y.o. 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 intake for 4 days and decreased verbalization 10/15 prior to arrival. Initial concerns for Hashimoto's encephalitis but extensive workup revealed CSF pleocytosis (273, mononuclear), positive NMDA receptor IgG, elevated IgG index and 3 oligoclonal bands in the CSF that are not noted in the serum. R frontal seizure on cEEG on 10/25, has not had any since she has been on Keppra. 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 She completed 5 days of high dose IV Solumedrol and completed IVIG x 5 doses.  Now status post 1 dose of Plex on 02/21/2021  NMDA encephalitis Acute encephalopathy, due to NMDA encephalitis and sedation and seizures New onset seizures Hypoalbuminemia Anemia Acute respiratory failure -Patient had 2 more focal motor seizure-like episodes overnight  Recommendation -We will start clonazepam 0.5 mg twice daily -Continue Depakote 500 mg every 8 hours, will check Depakote level -Continue Keppra 1500 mg twice daily -Continue LTM EEG until patient is seizure-free for about 24 hours - Will obtain MRI brain with and without contrast to look for any acute abnormality since the last MRI -Status post 1 round of plasmapheresis on 02/21/2021.  Given on MRI and EEG findings, will consider weaning off propofol tomorrow after the second round of plasmapheresis -Discussed plan in detail with patient's mother at bedside, family members on phone as well  as critical care team  CRITICAL CARE Performed by: Lora Havens   Total critical care time: 8minutes  Critical care time was exclusive of separately billable procedures and treating other patients.  Critical care was necessary to treat or prevent imminent or life-threatening deterioration.  Critical care was time spent personally by me on the following activities: development of treatment plan with patient and/or surrogate as well as nursing, discussions with consultants, evaluation of patient's response to treatment, examination of patient, obtaining history from patient or surrogate, ordering and performing treatments and interventions, ordering and review of laboratory studies, ordering and review of radiographic studies, pulse oximetry and re-evaluation of patient's condition.       Zeb Comfort Epilepsy [facility] For questions after 5pm please refer to AMION to reach the Neurologist on call

## 2021-02-22 NOTE — Progress Notes (Signed)
Rehooked vLTM following MRI  all impedances below 5kohms.  [facility] monitoring

## 2021-02-22 NOTE — Progress Notes (Addendum)
NAME:  Jamie [name], MRN:  [id number], DOB:  [birth year], LOS: 36 ADMISSION DATE:  02/06/2021, CONSULTATION DATE:  10/28 REFERRING MD:  Raymondo Band, CHIEF COMPLAINT:  Seizure, acute metabolic encephalopathy   History of Present Illness:  20 y/o female admitted with seizure and change in mental status.  She was treated with steroids and IVIg but despite this her condition worsened.  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  After treatment her mental status worsened and she developed inability to protect her airway in the setting of tachypnea and tachycardia.  She required intubation on 10/28.   Pertinent  Medical History  Acquired autoimmune hypothyroidism  Significant Hospital Events: Including procedures, antibiotic start and stop dates in addition to other pertinent events   10/16 Admit 10/25 New onset seizure activity, PCCM consult 1026 pulmonary critical care signed off 02/19/2021 pulmonary critical care reconsulted for suspected respiratory distress.  intubated, continuous EEG started 10/30 starting plasmapheresis  Micro/labs: 10/16 influenza AB PCR negative, SARS-CoV-2 PCR negative 10/16 HIV antibody negative 10/16 urine culture multiple species suggest recollection 10/16 urine drug screen negative, alcohol less than 10 10/17 CSF lymphocytic pleocytosis, fungal culture negative, CSF culture negative 10/17 CSF HSV 1 DNA negative, HSV-2 DNA negative, VZV PCR CSF negative 10/17 CSF cryptococcal antigen negative, cryptococcal antigen titer not indicated 10/17 CSF EBV positive  10/17 CMV qualitative CSF negative 10/17 NMDA IgG on CSF > 1:2560 10/18 HIV RNA quant less than 20 10/20 NMDA IgG serum: positive 1:1000 10/22 RPR nonreactive 10/22 QuantiFERON gold intermediate 10/23 EBV DNA QuantiFERON by PCR positive, less than 35 10/25 hepatitis B surface antigen nonreactive, hepatitis B core total antibody positive, hepatitis B antibody positive, hepatitis B DNA not  detected 10/25 hepatitis C antibody negative 10/26 blood culture no growth to date  Imaging: 10/16 CT head negative 10/16 MRI brain incomplete study due to patient intolerance without evidence of acute intracranial abnormality 10/22 chest abdomen pelvis CT scan no acute intrathoracic abdominal or pelvic pathology, no evidence of malignancy 10/22 MRI brain normal MRI of the brain 10/26 pelvic ultrasound right ovary not visualized, normal appearance of left ovary and uterus, trace free fluid in pelvis 10/27 pelvic MRI: Normal pelvic MRI, symmetric normal ovaries, no adnexal masses  Procedures: 10/28 endotracheal tube >  10/29 R IJ HD catheter >   Interim History / Subjective:  Tmax 99.7  Glucose range 81-104 Vent - 30%, PEEP 5  I/O 2.2L UOP, even for last 24 hours  Mother at bedside    Objective   Blood pressure (!) 101/57, pulse (!) 107, temperature 99.7 F (37.6 C), resp. rate 15, height 5\' 2"  (1.575 m), weight 60 kg, last menstrual period 01/11/2021, SpO2 100 %.    Vent Mode: PRVC FiO2 (%):  [30 %] 30 % Set Rate:  [15 bmp] 15 bmp Vt Set:  [400 mL] 400 mL PEEP:  [5 cmH20] 5 cmH20 Plateau Pressure:  [13 cmH20-14 cmH20] 13 cmH20   Intake/Output Summary (Last 24 hours) at 02/22/2021 0730 Last data filed at 02/22/2021 0700 Gross per 24 hour  Intake 2210.09 ml  Output 2255 ml  Net -44.91 ml   Filed Weights   02/12/21 1409 02/21/21 1055  Weight: 58.9 kg 60 kg    Examination: General: young adult female lying in bed in NAD on vent  HEENT: MM pink/moist, ETT, pupils 40mm reactive Neuro: sedate on propofol CV: s1s2 RRR, no m/r/g PULM:  non-labored on vent, lungs bilaterally clear GI: soft, bsx4 active  Extremities: warm/dry, no  edema, bilateral and small horizontal scars on front of thighs Skin: no rashes or lesions   Resolved Hospital Problem list     Assessment & Plan:   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   Discussed transfer to [facility] neuro-intensive care unit on 10/29, they recommend initiation of plasmapheresis followed by evaluation of clinical response, consideration of ECT.  MR Pelvis negative for ovarian teratoma. -PLEX started 10/30, next due on 11/1, total of 5 sessions planned, Rituximab planned post comp -continue Keppra, valproic acid, propofol  -plan of care per Neurology  -consider ECT?? -follow neuro exam   Acute respiratory failure with hypoxemia due to inability to protect airway -PRVC 8cc/kg  -wean PEEP / FiO2 for sats >90% -follow intermittent CXR  -assess ABG with prior gas reflecting alkalosis   Shock, due to propofol -propofol for sedation  -neosynephrine for MAP >65 while on sedation   Hypothyroidism -continue synthroid   Hypokalemia -monitor, replace as indicated  Anemia  -trend CBC -transfuse for Hgb <7% or active bleeding   Best Practice (right click and "Reselect all SmartList Selections" daily)  Diet/type: tubefeeds DVT prophylaxis: LMWH GI prophylaxis: PPI Lines: N/A Foley:  Yes, and it is still needed Code Status:  full code Last date of multidisciplinary goals of care discussion -full code.  Mother & Elenor Legato (who is an NP) updated extensively 10/31 on plan of care.  Critical care time: 55 minutes    Noe Gens, MSN, APRN, NP-C, AGACNP-BC [facility] 02/22/2021, 7:30 AM   Please see [email or web address] for pager details.   From 7A-7P if no response, please call [phone] After hours, please call Warren Lacy [phone]    Attending section: Remains on LTM and diprivan.  BP (!) 96/55   Pulse 90   Temp 100.2 F (37.9 C)   Resp 15   Ht 5\' 2"  (1.575 m)   Wt 60 kg   LMP 01/11/2021 Comment: neg preg test  SpO2 100%   BMI 24.19 kg/m   RASS -3.  ETT in place.  HR regular.  No wheeze.  Abdomen soft, decreased bowel sounds.    A/p  NMDA receptor encephalitis. Seizures. -  completed IVIG - day 2 of PLEX - will likely need rituxan after completing PLEX - continue AEDs - LTM per neuro; continue deep sedation with propofol, perhaps as soon as 11/01  Compromised airway. - continue vent support until mental status improves  Sedation induced hypotension. - phenylephrine for goal MAP > 65  Updated pt's mother at bedside and her aunt by phone.  CC time by me independent of APP time 33 minutes  Chesley Mires, MD [place] Pager - [phone] 02/22/2021, 2:05 PM

## 2021-02-23 ENCOUNTER — Inpatient Hospital Stay: Payer: Self-pay

## 2021-02-23 DIAGNOSIS — G0490 Encephalitis and encephalomyelitis, unspecified: Secondary | ICD-10-CM | POA: Diagnosis not present

## 2021-02-23 LAB — CBC
HCT: 31.6 % — ABNORMAL LOW (ref 36.0–46.0)
Hemoglobin: 10 g/dL — ABNORMAL LOW (ref 12.0–15.0)
MCH: 29.9 pg (ref 26.0–34.0)
MCHC: 31.6 g/dL (ref 30.0–36.0)
MCV: 94.3 fL (ref 80.0–100.0)
Platelets: 173 10*3/uL (ref 150–400)
RBC: 3.35 MIL/uL — ABNORMAL LOW (ref 3.87–5.11)
RDW: 14 % (ref 11.5–15.5)
WBC: 7 10*3/uL (ref 4.0–10.5)
nRBC: 0 % (ref 0.0–0.2)

## 2021-02-23 LAB — BASIC METABOLIC PANEL
Anion gap: 8 (ref 5–15)
Anion gap: 6 (ref 5–15)
BUN: 15 mg/dL (ref 6–20)
BUN: 13 mg/dL (ref 6–20)
CO2: 25 mmol/L (ref 22–32)
CO2: 26 mmol/L (ref 22–32)
Calcium: 8.8 mg/dL — ABNORMAL LOW (ref 8.9–10.3)
Calcium: 8.4 mg/dL — ABNORMAL LOW (ref 8.9–10.3)
Chloride: 107 mmol/L (ref 98–111)
Chloride: 106 mmol/L (ref 98–111)
Creatinine, Ser: 0.45 mg/dL (ref 0.44–1.00)
Creatinine, Ser: 0.42 mg/dL — ABNORMAL LOW (ref 0.44–1.00)
GFR, Estimated: >60 mL/min (ref >60)
GFR, Estimated: >60 mL/min (ref >60)
Glucose, Bld: 124 mg/dL — ABNORMAL HIGH (ref 70–99)
Glucose, Bld: 158 mg/dL — ABNORMAL HIGH (ref 70–99)
Potassium: 3.4 mmol/L — ABNORMAL LOW (ref 3.5–5.1)
Potassium: 3.8 mmol/L (ref 3.5–5.1)
Sodium: 140 mmol/L (ref 135–145)
Sodium: 138 mmol/L (ref 135–145)

## 2021-02-23 LAB — IMMUNOGLOBULINS A/E/G/M, SERUM
IgA: 159 mg/dL (ref 87–352)
IgE (Immunoglobulin E), Serum: 152 IU/mL (ref 6–495)
IgG (Immunoglobin G), Serum: 2748 mg/dL — ABNORMAL HIGH (ref 719–1475)
IgM (Immunoglobulin M), Srm: 72 mg/dL (ref 58–230)

## 2021-02-23 LAB — GLUCOSE, CAPILLARY
Glucose-Capillary: 106 mg/dL — ABNORMAL HIGH (ref 70–99)
Glucose-Capillary: 108 mg/dL — ABNORMAL HIGH (ref 70–99)
Glucose-Capillary: 133 mg/dL — ABNORMAL HIGH (ref 70–99)
Glucose-Capillary: 135 mg/dL — ABNORMAL HIGH (ref 70–99)
Glucose-Capillary: 135 mg/dL — ABNORMAL HIGH (ref 70–99)
Glucose-Capillary: 141 mg/dL — ABNORMAL HIGH (ref 70–99)

## 2021-02-23 LAB — TRIGLYCERIDES: Triglycerides: 112 mg/dL (ref <150)

## 2021-02-23 MED ORDER — HEPARIN SODIUM (PORCINE) 1000 UNIT/ML IJ SOLN
INTRAMUSCULAR | Status: AC
  Filled 2021-02-23: qty 3

## 2021-02-23 MED ORDER — SODIUM CHLORIDE 0.9% FLUSH: 10.0000 mL | INTRAVENOUS | Status: DC | PRN

## 2021-02-23 MED ORDER — ACD FORMULA A 0.73-2.45-2.2 GM/100ML VI SOLN
Status: AC
  Administered 2021-02-23: 1000 mL
  Filled 2021-02-23: qty 1000

## 2021-02-23 MED ORDER — CALCIUM GLUCONATE-NACL 2-0.675 GM/100ML-% IV SOLN
INTRAVENOUS | Status: AC
  Administered 2021-02-23: 2000 mg via INTRAVENOUS
  Filled 2021-02-23: qty 100

## 2021-02-23 MED ORDER — SORBITOL 70 % SOLN
960.0000 mL | TOPICAL_OIL | Freq: Once | ORAL | Status: AC
  Administered 2021-02-24: 100 mL via RECTAL
  Filled 2021-02-23: qty 473

## 2021-02-23 MED ORDER — CALCIUM GLUCONATE 10 % IV SOLN
2.0000 g | Freq: Once | INTRAVENOUS | Status: AC
  Administered 2021-02-23: 2 g via INTRAVENOUS
  Filled 2021-02-23: qty 20

## 2021-02-23 MED ORDER — SODIUM CHLORIDE 0.9% FLUSH
10.0000 mL | Freq: Two times a day (BID) | INTRAVENOUS | Status: DC
  Administered 2021-02-23 – 2021-02-25 (×6): 10 mL
  Administered 2021-02-26: 30 mL
  Administered 2021-02-26: 10 mL
  Administered 2021-02-27: 30 mL
  Administered 2021-02-28 – 2021-03-01 (×3): 10 mL
  Administered 2021-03-01 – 2021-03-02 (×2): 30 mL
  Administered 2021-03-02 – 2021-03-06 (×8): 10 mL
  Administered 2021-03-07: 30 mL
  Administered 2021-03-07 – 2021-03-09 (×4): 10 mL
  Administered 2021-03-09: 30 mL
  Administered 2021-03-10 – 2021-03-14 (×9): 10 mL
  Administered 2021-03-15: 20 mL
  Administered 2021-03-15 – 2021-03-16 (×2): 10 mL

## 2021-02-23 MED ORDER — CALCIUM GLUCONATE-NACL 2-0.675 GM/100ML-% IV SOLN
2.0000 g | Freq: Once | INTRAVENOUS | Status: AC
  Filled 2021-02-23: qty 100

## 2021-02-23 MED ORDER — SODIUM CHLORIDE 0.9 % IV SOLN
Freq: Once | INTRAVENOUS | Status: AC
  Filled 2021-02-23 (×4): qty 200

## 2021-02-23 MED ORDER — SENNOSIDES-DOCUSATE SODIUM 8.6-50 MG PO TABS
2.0000 | ORAL_TABLET | Freq: Two times a day (BID) | ORAL | Status: DC
  Administered 2021-02-23 – 2021-03-10 (×22): 2 via NASOGASTRIC
  Filled 2021-02-23 (×22): qty 2

## 2021-02-23 MED ORDER — ANTICOAGULANT SODIUM CITRATE 4% (200MG/5ML) IV SOLN
5.0000 mL | Freq: Once | Status: AC
  Administered 2021-02-23: 5 mL
  Filled 2021-02-23: qty 5

## 2021-02-23 MED ORDER — DIPHENHYDRAMINE HCL 25 MG PO CAPS: 25.0000 mg | ORAL_CAPSULE | Freq: Four times a day (QID) | ORAL | Status: DC | PRN

## 2021-02-23 MED ORDER — POTASSIUM CHLORIDE 20 MEQ PO PACK
40.0000 meq | PACK | Freq: Once | ORAL | Status: AC
  Administered 2021-02-23: 40 meq
  Filled 2021-02-23: qty 2

## 2021-02-23 MED ORDER — ACETAMINOPHEN 325 MG PO TABS
650.0000 mg | ORAL_TABLET | ORAL | Status: DC | PRN
  Administered 2021-02-25: 650 mg
  Filled 2021-02-23: qty 2

## 2021-02-23 MED ORDER — ACD FORMULA A 0.73-2.45-2.2 GM/100ML VI SOLN
1000.0000 mL | Status: DC
  Filled 2021-02-23 (×2): qty 1000

## 2021-02-23 NOTE — Progress Notes (Signed)
Pt completed TPE tx. Pt had hypotension 85/56 mmhg at last 33mins. Pt tolerated well. Bp recovered after tx 10 mins.

## 2021-02-23 NOTE — Progress Notes (Signed)
Peripherally Inserted Central Catheter Placement  The IV Nurse has discussed with the patient and/or persons authorized to consent for the patient, the purpose of this procedure and the potential benefits and risks involved with this procedure.  The benefits include less needle sticks, lab draws from the catheter, and the patient may be discharged home with the catheter. Risks include, but not limited to, infection, bleeding, blood clot (thrombus formation), and puncture of an artery; nerve damage and irregular heartbeat and possibility to perform a PICC exchange if needed/ordered by physician.  Alternatives to this procedure were also discussed.  Bard Power PICC patient education guide, fact sheet on infection prevention and patient information card has been provided to patient /or left at bedside.    PICC Placement Documentation  PICC Triple Lumen 02/23/21 PICC Left Brachial 40 cm 0 cm (Active)  Indication for Insertion or Continuance of Line Prolonged intravenous therapies 02/23/21 1247  Exposed Catheter (cm) 0 cm 02/23/21 1247  Site Assessment Clean;Dry 02/23/21 1247  Lumen #1 Status Flushed;Saline locked;Blood return noted 02/23/21 1247  Lumen #2 Status Blood return noted;Saline locked;Flushed 02/23/21 1247  Lumen #3 Status Blood return noted;Saline locked;Flushed 02/23/21 1247  Dressing Type Transparent;Securing device 02/23/21 1247  Dressing Status Clean;Dry;Intact 02/23/21 1247  Antimicrobial disc in place? Yes 02/23/21 1247  Safety Lock Not Applicable 03/75/43 6067  Dressing Change Due 03/02/21 02/23/21 1247   Consent sign by mother with assistance from family member for interpetation    Frances Maywood 02/23/2021, 12:49 PM

## 2021-02-23 NOTE — Progress Notes (Addendum)
Subjective: Had an episode of bilateral upper extremity twitching after stimulation per RN.  This was different from the episode she had yesterday evening when she went down for MRI during which the RN described left more than right upper extremity twitching as well as neck jerking which lasted for over 15 minutes and eventually resolved after IV Ativan.  ROS: Unable to obtain due to poor mental status  Examination  Vital signs in last 24 hours: Temp:  [97 F (36.1 C)-100.9 F (38.3 C)] 97.9 F (36.6 C) (11/01 0800) Pulse Rate:  [78-112] 96 (11/01 1121) Resp:  [14-21] 16 (11/01 1121) BP: (84-138)/(56-81) 94/64 (11/01 1121) SpO2:  [92 %-100 %] 100 % (11/01 1121) FiO2 (%):  [30 %-40 %] 40 % (11/01 1121) Weight:  [55.6 kg] 55.6 kg (11/01 0500)  General: lying in bed, NAD CVS: pulse-normal rate and rhythm RS: Intubated, CTAB Extremities: normal, warm Neuro: On propofol @50mcg /hr, comatose, does not open eyes to noxious stimuli, pupils reacting to light, corneal reflex intact, gag reflex intact, does not withdraw to noxious stimuli in all extremities, flaccid, areflexic  Basic Metabolic Panel: Recent Labs  Lab 02/17/21 0420 02/18/21 0246 02/19/21 0334 02/19/21 1719 02/20/21 0636 02/21/21 0700 02/23/21 0458  NA 130* 128* 132* 141 140 136 140  K 3.6 3.8 3.8 3.4* 2.8* 4.2 3.4*  CL 99 98 100  --  108 108 107  CO2 24 25 26   --  22 22 25   GLUCOSE 85 131* 101*  --  89 85 124*  BUN 17 18 20   --  17 12 15   CREATININE 0.63 0.60 0.59  --  0.62 0.44 0.45  CALCIUM 8.7* 8.7* 8.9  --  8.9 7.6* 8.8*  MG 2.0 2.2 2.3  --   --   --   --   PHOS 3.5  --  3.5  --   --   --   --     CBC: Recent Labs  Lab 02/17/21 0420 02/18/21 0246 02/19/21 0334 02/19/21 1719 02/20/21 0636 02/21/21 0700 02/23/21 0458  WBC 8.9 7.6 7.6  --  6.6 5.4 7.0  NEUTROABS 7.2 6.1 6.1  --  4.4 4.1  --   HGB 10.7* 10.9* 10.8* 10.9* 11.6* 9.7* 10.0*  HCT 32.4* 33.6* 33.6* 32.0* 35.8* 29.4* 31.6*  MCV 91.8 92.3 93.1   --  93.2 94.5 94.3  PLT 235 239 268  --  249 236 173     Coagulation Studies: No results for input(s): LABPROT, INR in the last 72 hours.  Imaging MRI brain with and without contrast 02/12/2021: No acute abnormality.  ASSESSMENT AND PLAN: 20 y.o. 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 intake for 4 days and decreased verbalization 10/15 prior to arrival. Initial concerns for Hashimoto's encephalitis but extensive workup revealed CSF pleocytosis (273, mononuclear), positive NMDA receptor IgG, elevated IgG index and 3 oligoclonal bands in the CSF that are not noted in the serum. R frontal seizure on cEEG on 10/25, has not had any since she has been on Keppra. 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 She completed 5 days of high dose IV Solumedrol and completed IVIG x 5 doses.  Now status post 1 dose of Plex on 02/21/2021   NMDA encephalitis Acute encephalopathy, due to NMDA encephalitis and sedation and seizures New onset  seizures Acute respiratory failure Dyskinesias -Patient had 2 more focal motor seizure-like episodes overnight  Recommendation -Continue Clonazepam 0.5 mg twice daily, Depakote 500 mg every 8 hours and Keppra 1500 mg twice daily -Continue LTM EEG until patient is seizure-free for about 24 hours - will receive 2/5 PLEX today. Will consider weaning off propofol after that.  Discussed with ICU team to consider using Precedex or fentanyl for sedation once we wean propofol. -Clonazepam will help with dyskinesias as well.  This can be titrated up if dyskinesias persist or worsen once sedation is weaned off. -Discussed plan in detail with patient's mother and family members at bedside at bedside.  Even though her clinical exam is limited due to propofol right  now, normal MRI brain and absence of any further seizures makes it more likely that patient will have better neurologic outcome.  However, it is difficult to exactly prognosticate if she will have any neurological deficits.   CRITICAL CARE Performed by: Lora Havens   Total critical care time: 50 minutes  Critical care time was exclusive of separately billable procedures and treating other patients.  Critical care was necessary to treat or prevent imminent or life-threatening deterioration.  Critical care was time spent personally by me on the following activities: development of treatment plan with patient and/or surrogate as well as nursing, discussions with consultants, evaluation of patient's response to treatment, examination of patient, obtaining history from patient or surrogate, ordering and performing treatments and interventions, ordering and review of laboratory studies, ordering and review of radiographic studies, pulse oximetry and re-evaluation of patient's condition.  Zeb Comfort Epilepsy [facility] For questions after 5pm please refer to AMION to reach the Neurologist on call

## 2021-02-23 NOTE — Procedures (Addendum)
Patient Name: Jamie [name]  MRN: [id number]  Epilepsy Attending: Lora Havens  Referring Physician/Provider: Dr Donnetta Simpers Duration: 02/22/2021 2138 to 02/23/2021 2138   Patient history:  20 y.o. female with PMH significant for Hashimoto thyroiditis who presents with vomiting, headache and not eating for 4 days and not talking since today. EEG to evaluate for seizure   Level of alertness: lethargic   AEDs during EEG study: LEV, VPA, Clonazepam, Propofol   Technical aspects: This EEG study was done with scalp electrodes positioned according to the 10-20 International system of electrode placement. Electrical activity was acquired at a sampling rate of 500Hz  and reviewed with a high frequency filter of 70Hz  and a low frequency filter of 1Hz . EEG data were recorded continuously and digitally stored.    Description: EEG showed continuous generalized predominantly 1-3Hz  rhythmic delta slowing with overriding 15-18Hz  fronto-central beta activity. At times 5-6hz  generalized theta slowing was also noted.  At times patient was also noted to have generalized, maximal bilateral posterior quadrant, periodic discharges with overriding fast activity ( GPD+) at 1 Hz.   Patient event button was pressed on 02/23/2021 at 0827.  Per RN, patient had arm movement and disconjugate gaze which was difficult to visualize on camera. Concomitant eeg showed continuous generalized high amplitude 1 to 3 Hz-delta slowing with overriding 15-18Hz  fronto-central beta activity with some evolution in frequency.  Patient event button was pressed on 02/23/2021 at 2118.  Per RN, patient had "moved eyebrows"  which was difficult to visualize on camera. Concomitant eeg showed continuous generalized high amplitude 1 to 3 Hz-delta slowing with overriding 15-18Hz  fronto-central beta activity with some evolution in frequency.   ABNORMALITY - Periodic discharges with overriding fast activity, generalized ( GPD+) - Delta  brush, generalized   IMPRESSION: This study showed evidence of epileptogenicity with generalized onset, maximal bilateral posterior quadrant.  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 Per RN, patient had arm movement and disconjugate gaze which was difficult to visualize on camera.  No concomitant EEG change was seen.  This was unlikely to be an epileptic event.  Patient event button was pressed on 02/23/2021 at 2118.  Per RN, patient had "moved eyebrows"  which was difficult to visualize on camera. No concomitant EEG change was seen.  This was unlikely to be an epileptic event.    Jamie [name] Jamie [name]

## 2021-02-23 NOTE — Progress Notes (Signed)
NAME:  Jamie [name], MRN:  [id number], DOB:  2000-08-22, LOS: 57 ADMISSION DATE:  02/06/2021, CONSULTATION DATE:  10/28 REFERRING MD:  Raymondo Band, CHIEF COMPLAINT:  Seizure, acute metabolic encephalopathy   History of Present Illness:  20 y/o female admitted with seizure and change in mental status.  She was treated with steroids and IVIg but despite this her condition worsened.  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  After treatment her mental status worsened and she developed inability to protect her airway in the setting of tachypnea and tachycardia.  She required intubation on 10/28.   Pertinent  Medical History  Acquired autoimmune hypothyroidism  Significant Hospital Events: Including procedures, antibiotic start and stop dates in addition to other pertinent events   10/16 Admit 10/25 New onset seizure activity, PCCM consult 1026 pulmonary critical care signed off 02/19/2021 pulmonary critical care reconsulted for suspected respiratory distress.  intubated, continuous EEG started 10/30 starting plasmapheresis 10/31 MRI normal, EEG with GPD+  Micro/labs: 10/16 influenza AB PCR negative, SARS-CoV-2 PCR negative 10/16 HIV antibody negative 10/16 urine culture multiple species suggest recollection 10/16 urine drug screen negative, alcohol less than 10 10/17 CSF lymphocytic pleocytosis, fungal culture negative, CSF culture negative 10/17 CSF HSV 1 DNA negative, HSV-2 DNA negative, VZV PCR CSF negative 10/17 CSF cryptococcal antigen negative, cryptococcal antigen titer not indicated 10/17 CSF EBV positive  10/17 CMV qualitative CSF negative 10/17 NMDA IgG on CSF > 1:2560 10/18 HIV RNA quant less than 20 10/20 NMDA IgG serum: positive 1:1000 10/22 RPR nonreactive 10/22 QuantiFERON gold intermediate 10/23 EBV DNA QuantiFERON by PCR positive, less than 35 10/25 hepatitis B surface antigen nonreactive, hepatitis B core total antibody positive, hepatitis B  antibody positive, hepatitis B DNA not detected 10/25 hepatitis C antibody negative 10/26 blood culture no growth to date  Imaging: 10/16 CT head negative 10/16 MRI brain incomplete study due to patient intolerance without evidence of acute intracranial abnormality 10/22 chest abdomen pelvis CT scan no acute intrathoracic abdominal or pelvic pathology, no evidence of malignancy 10/22 MRI brain normal MRI of the brain 10/26 pelvic ultrasound right ovary not visualized, normal appearance of left ovary and uterus, trace free fluid in pelvis 10/27 pelvic MRI: Normal pelvic MRI, symmetric normal ovaries, no adnexal masses 10/31 MRI > no acute infarct or hemorrhage. Normal MRI.   Procedures: 10/28 endotracheal tube >  10/29 R IJ HD catheter >   Interim History / Subjective:  RN reports pt seized while in MRI , additional agents added overnight Afebrile  Vent needs stable  On propofol, off neo for now   Objective   Blood pressure 93/60, pulse 89, temperature 97.9 F (36.6 C), resp. rate 15, height 5\' 2"  (1.575 m), weight 55.6 kg, last menstrual period 01/11/2021, SpO2 100 %.    Vent Mode: PRVC FiO2 (%):  [30 %] 30 % Set Rate:  [15 bmp] 15 bmp Vt Set:  [400 mL] 400 mL PEEP:  [5 cmH20] 5 cmH20 Plateau Pressure:  [12 cmH20-15 cmH20] 12 cmH20   Intake/Output Summary (Last 24 hours) at 02/23/2021 2993 Last data filed at 02/23/2021 0800 Gross per 24 hour  Intake 1886.34 ml  Output 1160 ml  Net 726.34 ml   Filed Weights   02/12/21 1409 02/21/21 1055 02/23/21 0500  Weight: 58.9 kg 60 kg 55.6 kg    Examination: General: young adult female lying in bed in NAD  HEENT: MM pink/moist, ETT, pupils 24mm equal/reactive, EEG leads in place  Neuro: sedate on propofol  CV: s1s2 RRR, no m/r/g PULM: non-labored at rest, lungs bilaterally clear GI: soft, bsx4 active  Extremities: warm/dry, no edema, bilateral thigh scarring in regular pattern  Skin: no rashes or lesions  Resolved Hospital  Problem list     Assessment & Plan:   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   Discussed transfer to [facility] neuro-intensive care unit on 10/29, they recommend initiation of plasmapheresis followed by evaluation of clinical response, consideration of ECT.  MR Pelvis negative for ovarian teratoma. -PLEX initiated 10/30, next planned 11/1.  Total of 5 sessions planned then rituximab  -continue keppra, valproic acid, propofol, clonazepam  -appreciate Neurology  -follow clinical exam, EEG   Acute respiratory failure with hypoxemia due to inability to protect airway -PRVC 8cc/kg  -PEEP / FiO2 to support sats >90% -no WUA given seizures  -follow CXR   Shock, due to propofol -propofol for sedation / seizure  -neosynephrine for MAP >65  Hypothyroidism -synthroid  Hypokalemia -monitor -kcl 11/1  Anemia  -trend CBC -transfuse for Hgb <7% or active bleed  Constipation  -bowel regimen  -last BM > small smear 10/31  -SMOG enema   Best Practice (right click and "Reselect all SmartList Selections" daily)  Diet/type: tubefeeds DVT prophylaxis: LMWH GI prophylaxis: PPI Lines: N/A Foley:  Yes, and it is still needed Code Status:  full code Last date of multidisciplinary goals of care discussion -full code.     Critical care time: 81 minutes    Noe Gens, MSN, APRN, NP-C, AGACNP-BC [facility] 02/23/2021, 9:27 AM   Please see [email or web address] for pager details.   From 7A-7P if no response, please call [phone] After hours, please call ELink [phone]

## 2021-02-24 ENCOUNTER — Inpatient Hospital Stay (HOSPITAL_COMMUNITY): Payer: Medicaid Other

## 2021-02-24 DIAGNOSIS — G0490 Encephalitis and encephalomyelitis, unspecified: Secondary | ICD-10-CM | POA: Diagnosis not present

## 2021-02-24 LAB — GLUCOSE, CAPILLARY
Glucose-Capillary: 104 mg/dL — ABNORMAL HIGH (ref 70–99)
Glucose-Capillary: 121 mg/dL — ABNORMAL HIGH (ref 70–99)
Glucose-Capillary: 128 mg/dL — ABNORMAL HIGH (ref 70–99)
Glucose-Capillary: 138 mg/dL — ABNORMAL HIGH (ref 70–99)
Glucose-Capillary: 140 mg/dL — ABNORMAL HIGH (ref 70–99)
Glucose-Capillary: 145 mg/dL — ABNORMAL HIGH (ref 70–99)

## 2021-02-24 LAB — BASIC METABOLIC PANEL
Anion gap: 6 (ref 5–15)
BUN: 10 mg/dL (ref 6–20)
CO2: 25 mmol/L (ref 22–32)
Calcium: 8.4 mg/dL — ABNORMAL LOW (ref 8.9–10.3)
Chloride: 108 mmol/L (ref 98–111)
Creatinine, Ser: 0.39 mg/dL — ABNORMAL LOW (ref 0.44–1.00)
GFR, Estimated: >60 mL/min (ref >60)
Glucose, Bld: 100 mg/dL — ABNORMAL HIGH (ref 70–99)
Potassium: 3.6 mmol/L (ref 3.5–5.1)
Sodium: 139 mmol/L (ref 135–145)

## 2021-02-24 LAB — CBC
HCT: 33.7 % — ABNORMAL LOW (ref 36.0–46.0)
Hemoglobin: 10.6 g/dL — ABNORMAL LOW (ref 12.0–15.0)
MCH: 29.9 pg (ref 26.0–34.0)
MCHC: 31.5 g/dL (ref 30.0–36.0)
MCV: 94.9 fL (ref 80.0–100.0)
Platelets: 192 10*3/uL (ref 150–400)
RBC: 3.55 MIL/uL — ABNORMAL LOW (ref 3.87–5.11)
RDW: 13.9 % (ref 11.5–15.5)
WBC: 10.3 10*3/uL (ref 4.0–10.5)
nRBC: 0 % (ref 0.0–0.2)

## 2021-02-24 LAB — PROCALCITONIN: Procalcitonin: 0.22 ng/mL

## 2021-02-24 MED ORDER — DEXMEDETOMIDINE HCL IN NACL 400 MCG/100ML IV SOLN
0.0000 ug/kg/h | INTRAVENOUS | Status: DC
  Administered 2021-02-24: 0.4 ug/kg/h via INTRAVENOUS
  Administered 2021-02-24 – 2021-02-25 (×2): 1 ug/kg/h via INTRAVENOUS
  Filled 2021-02-24 (×3): qty 100

## 2021-02-24 MED ORDER — VANCOMYCIN HCL 1250 MG/250ML IV SOLN
1250.0000 mg | Freq: Once | INTRAVENOUS | Status: AC
  Administered 2021-02-24: 1250 mg via INTRAVENOUS
  Filled 2021-02-24: qty 250

## 2021-02-24 MED ORDER — FENTANYL CITRATE PF 50 MCG/ML IJ SOSY: 50.0000 ug | PREFILLED_SYRINGE | INTRAMUSCULAR | Status: DC | PRN

## 2021-02-24 MED ORDER — FENTANYL CITRATE PF 50 MCG/ML IJ SOSY
50.0000 ug | PREFILLED_SYRINGE | INTRAMUSCULAR | Status: DC | PRN
  Administered 2021-02-26 (×2): 50 ug via INTRAVENOUS
  Administered 2021-02-26 – 2021-02-28 (×4): 100 ug via INTRAVENOUS
  Administered 2021-03-01: 50 ug via INTRAVENOUS
  Administered 2021-03-01: 100 ug via INTRAVENOUS
  Administered 2021-03-01: 50 ug via INTRAVENOUS
  Administered 2021-03-01 (×3): 100 ug via INTRAVENOUS
  Administered 2021-03-02 – 2021-03-05 (×11): 50 ug via INTRAVENOUS
  Administered 2021-03-05: 100 ug via INTRAVENOUS
  Administered 2021-03-05 (×2): 50 ug via INTRAVENOUS
  Administered 2021-03-05 – 2021-03-06 (×3): 100 ug via INTRAVENOUS
  Administered 2021-03-06: 50 ug via INTRAVENOUS
  Administered 2021-03-06: 100 ug via INTRAVENOUS
  Administered 2021-03-06: 50 ug via INTRAVENOUS
  Administered 2021-03-06 – 2021-03-08 (×3): 100 ug via INTRAVENOUS
  Administered 2021-03-08 – 2021-03-09 (×4): 50 ug via INTRAVENOUS
  Administered 2021-03-10: 100 ug via INTRAVENOUS
  Filled 2021-02-24: qty 1
  Filled 2021-02-24 (×2): qty 2
  Filled 2021-02-24: qty 1
  Filled 2021-02-24: qty 2
  Filled 2021-02-24: qty 1
  Filled 2021-02-24: qty 2
  Filled 2021-02-24 (×2): qty 1
  Filled 2021-02-24 (×2): qty 2
  Filled 2021-02-24 (×2): qty 1
  Filled 2021-02-24: qty 2
  Filled 2021-02-24: qty 1
  Filled 2021-02-24: qty 2
  Filled 2021-02-24: qty 1
  Filled 2021-02-24 (×2): qty 2
  Filled 2021-02-24 (×2): qty 1
  Filled 2021-02-24: qty 2
  Filled 2021-02-24 (×2): qty 1
  Filled 2021-02-24: qty 4
  Filled 2021-02-24 (×3): qty 1
  Filled 2021-02-24 (×4): qty 2
  Filled 2021-02-24: qty 4
  Filled 2021-02-24: qty 2
  Filled 2021-02-24 (×2): qty 1
  Filled 2021-02-24: qty 2
  Filled 2021-02-24: qty 1

## 2021-02-24 MED ORDER — SODIUM CHLORIDE 0.9 % IV SOLN
2.0000 g | Freq: Three times a day (TID) | INTRAVENOUS | Status: DC
  Administered 2021-02-24 – 2021-02-25 (×3): 2 g via INTRAVENOUS
  Filled 2021-02-24 (×3): qty 2

## 2021-02-24 MED ORDER — VANCOMYCIN HCL 750 MG/150ML IV SOLN
750.0000 mg | Freq: Two times a day (BID) | INTRAVENOUS | Status: DC
  Administered 2021-02-25 – 2021-02-26 (×3): 750 mg via INTRAVENOUS
  Filled 2021-02-24 (×3): qty 150

## 2021-02-24 MED ORDER — DOCUSATE SODIUM 50 MG/5ML PO LIQD
100.0000 mg | Freq: Two times a day (BID) | ORAL | Status: DC
  Administered 2021-02-25 – 2021-03-10 (×14): 100 mg
  Filled 2021-02-24 (×20): qty 10

## 2021-02-24 NOTE — Procedures (Addendum)
Patient Name: Jamie [name]  MRN: [id number]  Epilepsy Attending: Lora Havens  Referring Physician/Provider: Dr Donnetta Simpers Duration: 02/23/2021 2138 to 02/24/2021 2138   Patient history:  20 y.o. female with PMH significant for Hashimoto thyroiditis who presents with vomiting, headache and not eating for 4 days and not talking since today. EEG to evaluate for seizure   Level of alertness: lethargic   AEDs during EEG study: LEV, VPA, Clonazepam, Propofol   Technical aspects: This EEG study was done with scalp electrodes positioned according to the 10-20 International system of electrode placement. Electrical activity was acquired at a sampling rate of 500Hz  and reviewed with a high frequency filter of 70Hz  and a low frequency filter of 1Hz . EEG data were recorded continuously and digitally stored.    Description: EEG showed intermittent generalized predominantly 1-3Hz  rhythmic delta slowing with overriding 15-18Hz  fronto-central beta activity. There was also intermittent 5-6hz  generalized theta slowing was also noted.  At times patient was also noted to have generalized, maximal bilateral posterior quadrant, periodic discharges with overriding fast activity ( GPD+) at 1 Hz.   Patient event button was pressed on 02/23/2021 at 2202 for right index finger twitching with flexion and extension movements. Concomitant eeg showed continuous generalized high amplitude 1 to 3 Hz-delta slowing with overriding 15-18Hz  fronto-central beta activity.  Patient event button was pressed on 02/24/2021 multiple times for left or right arm movements. Concomitant eeg showed continuous generalized high amplitude 1 to 3 Hz-delta slowing with overriding 15-18Hz  fronto-central beta activity.   ABNORMALITY - Periodic discharges with overriding fast activity, generalized ( GPD+) - Delta brush, generalized - Continuous slow, generalized   IMPRESSION: This study showed evidence of epileptogenicity with  generalized onset, maximal bilateral posterior quadrant. Additionally there is moderate to severe diffuse encephalopathy, nonspecific etiology but likely secondary to NMDA encephalitis as well as sedation   Patient event button was pressed on 02/23/2021 at 2202 for right index finger twitching with flexion and extension movements. No concomitant EEG change was seen to suggest seizure. This was unlikely to be an epileptic event.  Patient event button was pressed on 02/24/2021 multiple times for left or right arm movements. No concomitant EEG change was seen to suggest seizure. This was unlikely to be an epileptic event.   Jarmal Lewelling [name]

## 2021-02-24 NOTE — Progress Notes (Signed)
Patient had a rhythm change noted on the cardiac monitor. An EKG was collected and posted to the chart. MD was notified of the change and that patient displayed seizure like activity right after the rhythm change for 30 min. No intervention was warranted at this time. Will continue to monitor patient.

## 2021-02-24 NOTE — Progress Notes (Signed)
Pharmacy Antibiotic Note  Uchechi Denison is a 20 y.o. female admitted on 02/06/2021 with pneumonia.  Pharmacy has been consulted for vancomycin/cefepime dosing. SCr 0.39 stable.  Plan: Cefepime 2g IV q8h Vancomycin 1250mg  IV x 1; then 750mg  IV q12h. Goal AUC 400-550. Expected AUC: 462 SCr used: 0.8 Monitor clinical progress, c/s, renal function F/u de-escalation plan/LOT, vancomycin levels as indicated   Height: 5\' 2"  (157.5 cm) Weight: 57.3 kg (126 lb 5.2 oz) IBW/kg (Calculated) : 50.1  Temp (24hrs), Avg:99.4 F (37.4 C), Min:97.6 F (36.4 C), Max:103 F (39.4 C)  Recent Labs  Lab 02/19/21 0334 02/20/21 0636 02/21/21 0700 02/23/21 0458 02/23/21 1533 02/24/21 0534  WBC 7.6 6.6 5.4 7.0  --  10.3  CREATININE 0.59 0.62 0.44 0.45 0.42* 0.39*    Estimated Creatinine Clearance: 89.5 mL/min (A) (by C-G formula based on SCr of 0.39 mg/dL (L)).    No Known Allergies    Arturo Morton, PharmD, BCPS Please check AMION for all [place] contact numbers Clinical Pharmacist 02/24/2021 3:13 PM

## 2021-02-24 NOTE — Progress Notes (Addendum)
02/24/2021  I have seen and evaluated the patient for respiratory failure.  S:  No events, working on weaning propofol today. Spiking fevers however with concomitant tachycardia.  O: Blood pressure 102/62, pulse 88, temperature 98.1 F (36.7 C), temperature source Axillary, resp. rate 18, height 5\' 2"  (1.575 m), weight 57.3 kg, last menstrual period 01/11/2021, SpO2 100 %.  Heavily sedated for me Disconjugate gaze, pupils reactive Does not w/d to pain for me Lungs clear, heart sounds regular, ext without edema  Bmp benign WBC up slightly  A:  Acute hypoxemic respiratory due to inability to protect airway in context of suspected NMDA receptor encephalitis.    Dyskinesias without EEG correlate  Worsening fevers, WBC, tachycardia r/o hospital acquired infection  P:  PLEX per neurology Lighten prop and switch to precedex and PRN fentanyl Check CXR, low threshold to initiate HCAP coverage AEDs per neurology Pressors PRN for sedation associated hypotension  Patient critically ill due to shock, resp failure Interventions to address this today pressor titration, vent titration Risk of deterioration without these interventions is high  I personally spent 33 minutes providing critical care not including any separately billable procedures  Erskine Emery MD [facility] epic messenger for cross cover needs If after hours, please call E-link     NAME:  Jamie [name], MRN:  [id number], DOB:  06/16/2000, LOS: 17 ADMISSION DATE:  02/06/2021, CONSULTATION DATE:  10/28 REFERRING MD:  Raymondo Band, CHIEF COMPLAINT:  Seizure, acute metabolic encephalopathy   History of Present Illness:  20 y/o female admitted with seizure and change in mental status.  She was treated with steroids and IVIg but despite this her condition worsened.  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  After treatment her mental status worsened and she developed inability to  protect her airway in the setting of tachypnea and tachycardia.  She required intubation on 10/28.   Pertinent  Medical History  Acquired autoimmune hypothyroidism  Significant Hospital Events: Including procedures, antibiotic start and stop dates in addition to other pertinent events   10/16 Admit 10/25 New onset seizure activity, PCCM consult 1026 pulmonary critical care signed off 02/19/2021 pulmonary critical care reconsulted for suspected respiratory distress.  intubated, continuous EEG started 10/30 starting plasmapheresis 10/31 MRI normal, EEG with GPD+ 11/1 RN reports pt seized while in MRI , additional agents added overnight.     Micro/labs: 10/16 influenza AB PCR negative, SARS-CoV-2 PCR negative 10/16 HIV antibody negative 10/16 urine culture multiple species suggest recollection 10/16 urine drug screen negative, alcohol less than 10 10/17 CSF lymphocytic pleocytosis, fungal culture negative, CSF culture negative 10/17 CSF HSV 1 DNA negative, HSV-2 DNA negative, VZV PCR CSF negative 10/17 CSF cryptococcal antigen negative, cryptococcal antigen titer not indicated 10/17 CSF EBV positive  10/17 CMV qualitative CSF negative 10/17 NMDA IgG on CSF > 1:2560 10/18 HIV RNA quant less than 20 10/20 NMDA IgG serum: positive 1:1000 10/22 RPR nonreactive 10/22 QuantiFERON gold intermediate 10/23 EBV DNA QuantiFERON by PCR positive, less than 35 10/25 hepatitis B surface antigen nonreactive, hepatitis B core total antibody positive, hepatitis B antibody positive, hepatitis B DNA not detected 10/25 hepatitis C antibody negative 10/26 blood culture no growth to date  Imaging: 10/16 CT head negative 10/16 MRI brain incomplete study due to patient intolerance without evidence of acute intracranial abnormality 10/22 chest abdomen pelvis CT scan no acute intrathoracic abdominal or pelvic pathology, no evidence of malignancy 10/22 MRI brain normal MRI of the brain 10/26 pelvic  ultrasound right ovary not visualized, normal appearance of left ovary and uterus, trace free fluid in pelvis 10/27 pelvic MRI: Normal pelvic MRI, symmetric normal ovaries, no adnexal masses 10/31 MRI > no acute infarct or hemorrhage. Normal MRI.   Procedures: 10/28 endotracheal tube >  10/29 R IJ HD catheter >  11/1 RUE PICC >  Interim History / Subjective:  Tmax 103 / WBC 10.3, new tachycardia Vent - PEEP 5, 30% EEG event 11/2 with finger twitching but no correlating changes on EEG RN reports episodes of twitching throughout the am with stimulation.   Pt had BM    Objective   Blood pressure 102/63, pulse (!) 107, temperature 99.3 F (37.4 C), temperature source Axillary, resp. rate 18, height 5\' 2"  (1.575 m), weight 57.3 kg, last menstrual period 01/11/2021, SpO2 100 %.    Vent Mode: PRVC FiO2 (%):  [30 %-40 %] 30 % Set Rate:  [15 bmp] 15 bmp Vt Set:  [400 mL] 400 mL PEEP:  [5 cmH20] 5 cmH20 Plateau Pressure:  [12 cmH20-17 cmH20] 12 cmH20   Intake/Output Summary (Last 24 hours) at 02/24/2021 0735 Last data filed at 02/24/2021 2409 Gross per 24 hour  Intake 1910.88 ml  Output 1400 ml  Net 510.88 ml   Filed Weights   02/23/21 1636 02/23/21 1738 02/24/21 0446  Weight: 54.6 kg 55 kg 57.3 kg    Examination: General: young adult female lying in bed, critically ill appearing   HEENT: MM pink/moist, ETT, anicteric, pupils 26mm reactive  Neuro: sedate, occasional fine tremor noted, pulls arms toward midline with sternal rub CV: s1s2 RRR, no m/r/g PULM: non-labored, lungs bilaterally clear GI: soft, bsx4 active  Extremities: warm/dry, no edema  Skin: no rashes or lesions  Resolved Hospital Problem list     Assessment & Plan:   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   Discussed transfer to [facility] neuro-intensive care unit on 10/29, they recommend initiation of plasmapheresis  followed by evaluation of clinical response, consideration of ECT.  MR Pelvis negative for ovarian teratoma. -PLEX initiated 10/30.  Second treatment 11/1.  Total of 5 sessions planned then rituximab.  -continue keppra, valproic acid, propofol, clonazepam -follow neuro exam  -EEG per Neurology  -appreciate neurology assistance with patient care  Acute respiratory failure with hypoxemia due to inability to protect airway -PRVC 8cc/kg  -PEEP /fiO2 for sats >90% -follow CXR  Shock, due to sedation -change propofol to precedex with PRN fentanyl  -vasopressors if needed for MAP >65  Fever -assess tracheal aspirate, PCT  -begin empiric HAP coverage  -assess CXR  Hypothyroidism -continue synthroid   Hypokalemia -monitor, replace as indicated   Anemia  -trend CBC  -transfuse for Hgb <7% or active bleeding   Constipation  BM 11/2 -bowel regimen   Best Practice (right click and "Reselect all SmartList Selections" daily)  Diet/type: tubefeeds DVT prophylaxis: LMWH GI prophylaxis: PPI Lines: N/A Foley:  Yes, and it is still needed Code Status:  full code Last date of multidisciplinary goals of care discussion -full code.     Critical care time: 62 minutes    Noe Gens, MSN, APRN, NP-C, AGACNP-BC [facility] 02/24/2021, 7:35 AM   Please see [email or web address] for pager details.   From 7A-7P if no response, please call [phone] After hours, please call ELink [phone]

## 2021-02-24 NOTE — Progress Notes (Signed)
EEG maintenance complete. Skin breakdown F8 moved electrode. RN notified. No other skin breakdown noted. Continue to monitor

## 2021-02-24 NOTE — Progress Notes (Signed)
Subjective:  Had soft pressures and plasma exchange yesterday  ROS: Unable to obtain due to poor mental status  Examination  Vital signs in last 24 hours: Temp:  [97.6 F (36.4 C)-103 F (39.4 C)] 98.1 F (36.7 C) (11/02 0800) Pulse Rate:  [93-119] 100 (11/02 0801) Resp:  [15-25] 17 (11/02 0801) BP: (79-116)/(52-80) 106/62 (11/02 0801) SpO2:  [93 %-100 %] 99 % (11/02 0801) FiO2 (%):  [30 %-40 %] 30 % (11/02 0801) Weight:  [54.6 kg-57.3 kg] 57.3 kg (11/02 0446)  General: lying in bed, NAD CVS: pulse-normal rate and rhythm RS: Intubated, CTAB Extremities: normal, warm Neuro: On propofol , comatose, does not open eyes to noxious stimuli, pupils reacting to light, corneal reflex intact, gag reflex intact, does not withdraw to noxious stimuli in all extremities, flaccid, areflexic  Basic Metabolic Panel: Recent Labs  Lab 02/18/21 0246 02/19/21 0334 02/19/21 1719 02/20/21 0636 02/21/21 0700 02/23/21 0458 02/23/21 1533 02/24/21 0534  NA 128* 132*   < > 140 136 140 138 139  K 3.8 3.8   < > 2.8* 4.2 3.4* 3.8 3.6  CL 98 100  --  108 108 107 106 108  CO2 25 26  --  22 22 25 26 25   GLUCOSE 131* 101*  --  89 85 124* 158* 100*  BUN 18 20  --  17 12 15 13 10   CREATININE 0.60 0.59  --  0.62 0.44 0.45 0.42* 0.39*  CALCIUM 8.7* 8.9  --  8.9 7.6* 8.8* 8.4* 8.4*  MG 2.2 2.3  --   --   --   --   --   --   PHOS  --  3.5  --   --   --   --   --   --    < > = values in this interval not displayed.     CBC: Recent Labs  Lab 02/18/21 0246 02/19/21 0334 02/19/21 1719 02/20/21 0636 02/21/21 0700 02/23/21 0458 02/24/21 0534  WBC 7.6 7.6  --  6.6 5.4 7.0 10.3  NEUTROABS 6.1 6.1  --  4.4 4.1  --   --   HGB 10.9* 10.8* 10.9* 11.6* 9.7* 10.0* 10.6*  HCT 33.6* 33.6* 32.0* 35.8* 29.4* 31.6* 33.7*  MCV 92.3 93.1  --  93.2 94.5 94.3 94.9  PLT 239 268  --  249 236 173 192      Coagulation Studies: No results for input(s): LABPROT, INR in the last 72 hours.  Imaging MRI brain with  and without contrast 02/12/2021: No acute abnormality.  ASSESSMENT AND PLAN: 20 y.o. 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 intake for 4 days and decreased verbalization 10/15 prior to arrival. Initial concerns for Hashimoto's encephalitis but extensive workup revealed CSF pleocytosis (273, mononuclear), positive NMDA receptor IgG, elevated IgG index and 3 oligoclonal bands in the CSF that are not noted in the serum. R frontal seizure on cEEG on 10/25, has not had any since she has been on Keppra. 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 She completed 5 days of high dose IV Solumedrol and completed IVIG x 5 doses.   Has had 2 PLEX  IMP:  NMDA encephalitis Acute encephalopathy, due to NMDA encephalitis and sedation and seizures New onset seizures Acute respiratory failure Dyskinesias  Recommendations -Continue Clonazepam 0.5 mg  twice daily, Depakote 500 mg every 8 hours and Keppra 1500 mg twice daily -Continue LTM EEG for another 24h.  - will receive 3/5 PLEX tomorrow. - DC propofol and start precedex -Clonazepam will help with dyskinesias as well.  This can be titrated up if dyskinesias persist or worsen once sedation is weaned off. -Discussed plan in detail with patient's mother and PCCM team  CRITICAL CARE ATTESTATION Performed by: Amie Portland, MD Total critical care time: 30 minutes Critical care time was exclusive of separately billable procedures and treating other patients and/or supervising APPs/Residents/Students Critical care was necessary to treat or prevent imminent or life-threatening deterioration due to NMDA encephalitis This patient is critically ill and at significant risk for neurological worsening and/or death and care requires constant  monitoring. Critical care was time spent personally by me on the following activities: development of treatment plan with patient and/or surrogate as well as nursing, discussions with consultants, evaluation of patient's response to treatment, examination of patient, obtaining history from patient or surrogate, ordering and performing treatments and interventions, ordering and review of laboratory studies, ordering and review of radiographic studies, pulse oximetry, re-evaluation of patient's condition, participation in multidisciplinary rounds and medical decision making of high complexity in the care of this patient.

## 2021-02-25 ENCOUNTER — Inpatient Hospital Stay (HOSPITAL_COMMUNITY): Payer: Medicaid Other

## 2021-02-25 DIAGNOSIS — B2790 Infectious mononucleosis, unspecified without complication: Secondary | ICD-10-CM

## 2021-02-25 DIAGNOSIS — F061 Catatonic disorder due to known physiological condition: Secondary | ICD-10-CM | POA: Diagnosis not present

## 2021-02-25 DIAGNOSIS — G0490 Encephalitis and encephalomyelitis, unspecified: Secondary | ICD-10-CM | POA: Diagnosis not present

## 2021-02-25 DIAGNOSIS — E039 Hypothyroidism, unspecified: Secondary | ICD-10-CM | POA: Diagnosis not present

## 2021-02-25 DIAGNOSIS — G0481 Other encephalitis and encephalomyelitis: Secondary | ICD-10-CM | POA: Diagnosis not present

## 2021-02-25 LAB — ECHOCARDIOGRAM COMPLETE
AR max vel: 2.39 cm2
AV Peak grad: 8 mmHg
Ao pk vel: 1.41 m/s
Area-P 1/2: 7.37 cm2
Calc EF: 57.9 %
Height: 62 in
S' Lateral: 2 cm
Single Plane A2C EF: 57.7 %
Single Plane A4C EF: 58.9 %
Weight: 2000.01 oz

## 2021-02-25 LAB — CBC
HCT: 30.5 % — ABNORMAL LOW (ref 36.0–46.0)
Hemoglobin: 9.8 g/dL — ABNORMAL LOW (ref 12.0–15.0)
MCH: 30 pg (ref 26.0–34.0)
MCHC: 32.1 g/dL (ref 30.0–36.0)
MCV: 93.3 fL (ref 80.0–100.0)
Platelets: 204 10*3/uL (ref 150–400)
RBC: 3.27 MIL/uL — ABNORMAL LOW (ref 3.87–5.11)
RDW: 13.9 % (ref 11.5–15.5)
WBC: 11.9 10*3/uL — ABNORMAL HIGH (ref 4.0–10.5)
nRBC: 0 % (ref 0.0–0.2)

## 2021-02-25 LAB — GLUCOSE, CAPILLARY
Glucose-Capillary: 134 mg/dL — ABNORMAL HIGH (ref 70–99)
Glucose-Capillary: 145 mg/dL — ABNORMAL HIGH (ref 70–99)
Glucose-Capillary: 137 mg/dL — ABNORMAL HIGH (ref 70–99)
Glucose-Capillary: 96 mg/dL (ref 70–99)
Glucose-Capillary: 101 mg/dL — ABNORMAL HIGH (ref 70–99)
Glucose-Capillary: 120 mg/dL — ABNORMAL HIGH (ref 70–99)

## 2021-02-25 LAB — BASIC METABOLIC PANEL
Anion gap: 7 (ref 5–15)
Anion gap: 7 (ref 5–15)
BUN: 11 mg/dL (ref 6–20)
BUN: 10 mg/dL (ref 6–20)
CO2: 24 mmol/L (ref 22–32)
CO2: 22 mmol/L (ref 22–32)
Calcium: 8.2 mg/dL — ABNORMAL LOW (ref 8.9–10.3)
Calcium: 8.4 mg/dL — ABNORMAL LOW (ref 8.9–10.3)
Chloride: 114 mmol/L — ABNORMAL HIGH (ref 98–111)
Chloride: 116 mmol/L — ABNORMAL HIGH (ref 98–111)
Creatinine, Ser: 0.56 mg/dL (ref 0.44–1.00)
Creatinine, Ser: 0.43 mg/dL — ABNORMAL LOW (ref 0.44–1.00)
GFR, Estimated: >60 mL/min (ref >60)
GFR, Estimated: >60 mL/min (ref >60)
Glucose, Bld: 171 mg/dL — ABNORMAL HIGH (ref 70–99)
Glucose, Bld: 169 mg/dL — ABNORMAL HIGH (ref 70–99)
Potassium: 3.3 mmol/L — ABNORMAL LOW (ref 3.5–5.1)
Potassium: 4.1 mmol/L (ref 3.5–5.1)
Sodium: 145 mmol/L (ref 135–145)
Sodium: 145 mmol/L (ref 135–145)

## 2021-02-25 LAB — TROPONIN I (HIGH SENSITIVITY)
Troponin I (High Sensitivity): 8 ng/L (ref <18)
Troponin I (High Sensitivity): 8 ng/L (ref <18)

## 2021-02-25 LAB — AMMONIA: Ammonia: 44 umol/L — ABNORMAL HIGH (ref 9–35)

## 2021-02-25 LAB — PROCALCITONIN: Procalcitonin: 0.22 ng/mL

## 2021-02-25 LAB — MAGNESIUM: Magnesium: 2.3 mg/dL (ref 1.7–2.4)

## 2021-02-25 MED ORDER — SODIUM CHLORIDE 0.9 % IV SOLN
INTRAVENOUS | Status: AC
  Filled 2021-02-25 (×3): qty 200

## 2021-02-25 MED ORDER — COLCHICINE 0.6 MG PO TABS
0.6000 mg | ORAL_TABLET | Freq: Every day | ORAL | Status: DC
  Administered 2021-02-25 – 2021-03-16 (×20): 0.6 mg
  Filled 2021-02-25 (×20): qty 1

## 2021-02-25 MED ORDER — HEPARIN SODIUM (PORCINE) 1000 UNIT/ML IJ SOLN: 1000.0000 [IU] | Freq: Once | INTRAMUSCULAR | Status: AC

## 2021-02-25 MED ORDER — POTASSIUM CHLORIDE 20 MEQ PO PACK
40.0000 meq | PACK | Freq: Once | ORAL | Status: AC
  Administered 2021-02-25: 40 meq

## 2021-02-25 MED ORDER — ACD FORMULA A 0.73-2.45-2.2 GM/100ML VI SOLN
Status: AC
  Administered 2021-02-25: 1000 mL
  Filled 2021-02-25: qty 1000

## 2021-02-25 MED ORDER — HEPARIN SODIUM (PORCINE) 1000 UNIT/ML IJ SOLN
INTRAMUSCULAR | Status: AC
  Administered 2021-02-25: 1000 [IU]
  Filled 2021-02-25: qty 3

## 2021-02-25 MED ORDER — TRAMADOL HCL 50 MG PO TABS
50.0000 mg | ORAL_TABLET | Freq: Four times a day (QID) | ORAL | Status: DC
  Administered 2021-02-25 – 2021-03-04 (×26): 50 mg
  Filled 2021-02-25 (×26): qty 1

## 2021-02-25 MED ORDER — CLONAZEPAM 0.25 MG PO TBDP
1.0000 mg | ORAL_TABLET | Freq: Two times a day (BID) | ORAL | Status: DC
  Administered 2021-02-26 – 2021-02-27 (×4): 1 mg
  Filled 2021-02-25 (×4): qty 4

## 2021-02-25 MED ORDER — POTASSIUM CHLORIDE 20 MEQ PO PACK
40.0000 meq | PACK | Freq: Once | ORAL | Status: DC
  Filled 2021-02-25: qty 2

## 2021-02-25 MED ORDER — ATROPINE SULFATE 1 MG/10ML IJ SOSY
1.0000 mg | PREFILLED_SYRINGE | Freq: Once | INTRAMUSCULAR | Status: AC
Start: 2021-02-25 — End: 2021-02-25
  Administered 2021-02-25: 1 mg via INTRAVENOUS

## 2021-02-25 MED ORDER — CALCIUM GLUCONATE-NACL 2-0.675 GM/100ML-% IV SOLN
INTRAVENOUS | Status: AC
  Administered 2021-02-25: 2000 mg via INTRAVENOUS
  Filled 2021-02-25: qty 100

## 2021-02-25 MED ORDER — CEFAZOLIN SODIUM-DEXTROSE 2-4 GM/100ML-% IV SOLN
2.0000 g | Freq: Three times a day (TID) | INTRAVENOUS | Status: AC
  Administered 2021-02-25 – 2021-03-04 (×21): 2 g via INTRAVENOUS
  Filled 2021-02-25 (×21): qty 100

## 2021-02-25 MED ORDER — DIPHENHYDRAMINE HCL 25 MG PO CAPS: 25.0000 mg | ORAL_CAPSULE | Freq: Four times a day (QID) | ORAL | Status: DC | PRN

## 2021-02-25 MED ORDER — CALCIUM GLUCONATE-NACL 2-0.675 GM/100ML-% IV SOLN
2.0000 g | Freq: Once | INTRAVENOUS | Status: AC
  Filled 2021-02-25: qty 100

## 2021-02-25 MED ORDER — SODIUM CHLORIDE 0.9 % IV SOLN: 0.5000 mg/kg/h | INTRAVENOUS | Status: DC

## 2021-02-25 MED ORDER — ACETAMINOPHEN 325 MG PO TABS: 650.0000 mg | ORAL_TABLET | ORAL | Status: DC | PRN

## 2021-02-25 MED ORDER — MAGNESIUM SULFATE 2 GM/50ML IV SOLN
2.0000 g | Freq: Once | INTRAVENOUS | Status: AC
  Administered 2021-02-25: 2 g via INTRAVENOUS
  Filled 2021-02-25: qty 50

## 2021-02-25 MED ORDER — POTASSIUM CHLORIDE 20 MEQ PO PACK
40.0000 meq | PACK | Freq: Once | ORAL | Status: AC
  Administered 2021-02-25: 40 meq
  Filled 2021-02-25: qty 2

## 2021-02-25 MED ORDER — SODIUM CHLORIDE 0.9 % IV SOLN: INTRAVENOUS | Status: DC

## 2021-02-25 MED ORDER — METOCLOPRAMIDE HCL 5 MG/ML IJ SOLN
10.0000 mg | Freq: Three times a day (TID) | INTRAMUSCULAR | Status: DC
Start: 2021-02-25 — End: 2021-03-03
  Administered 2021-02-25 – 2021-03-03 (×17): 10 mg via INTRAVENOUS
  Filled 2021-02-25 (×17): qty 2

## 2021-02-25 MED ORDER — ACD FORMULA A 0.73-2.45-2.2 GM/100ML VI SOLN: 1000.0000 mL | Status: DC

## 2021-02-25 MED ORDER — KETAMINE BOLUS VIA INFUSION: 1.0000 mg/kg | Freq: Once | INTRAVENOUS | Status: DC

## 2021-02-25 NOTE — Progress Notes (Addendum)
During repositioning, patient started to vomit with episode of bradycardia into the 30's. Recovered quickly to HR 50's, and then dropped again to the 20's BP 76/41. Atropine given, Neo adjusted. CCM at bedside. HR now ST 113, BP 121/73 MAP 86. Tube feed stopped, LIS started with moderate output. CCM to order standing antiemetic. ABD xray ordered. Cardio paged to update.

## 2021-02-25 NOTE — Consult Note (Signed)
Ref: Ok Edwards, MD   Subjective:  Normal troponin. EKG changes are improving. Echocardiogram shows normal LV systolic function with small pericardial effusion.  Objective:  Vital Signs in the last 24 hours: Temp:  [98.1 F (36.7 C)-103.2 F (39.6 C)] 98.5 F (36.9 C) (11/03 1031) Pulse Rate:  [64-108] 86 (11/03 1045) Cardiac Rhythm: Other (Comment) (11/02 2000) Resp:  [15-41] 20 (11/03 1045) BP: (82-156)/(41-115) 108/56 (11/03 1052) SpO2:  [95 %-100 %] 100 % (11/03 1045) FiO2 (%):  [30 %] 30 % (11/03 0830) Weight:  [56.7 kg-58 kg] 58 kg (11/03 1031)  Physical Exam: BP Readings from Last 1 Encounters:  02/25/21 (!) 108/56     Wt Readings from Last 1 Encounters:  02/25/21 58 kg (51 %, Z= 0.02)*   * Growth percentiles are based on CDC (Girls, 2-20 Years) data.    Weight change: 2.1 kg Body mass index is 23.39 kg/m. HEENT: [place]/AT, Eyes-Brown, Conjunctiva-Pink, Sclera-Non-icteric Neck: No JVD, No bruit, Trachea midline. Lungs:  Clear, Bilateral. Cardiac:  Regular wide complex rhythm, normal S1 and S2, no S3. II/VI systolic murmur. Abdomen:  Soft, non-tender. BS present. Extremities:  No edema present. No cyanosis. No clubbing. CNS: AxOx0.  Skin: Warm and dry.   Intake/Output from previous day: 11/02 0701 - 11/03 0700 In: 2988.4 [I.V.:859.2; NG/GT:1176.3; IV Piggyback:953] Out: 2100 [Urine:2100]    Lab Results: BMET    Component Value Date/Time   NA 145 02/25/2021 0412   NA 139 02/24/2021 0534   NA 138 02/23/2021 1533   K 3.3 (L) 02/25/2021 0412   K 3.6 02/24/2021 0534   K 3.8 02/23/2021 1533   CL 114 (H) 02/25/2021 0412   CL 108 02/24/2021 0534   CL 106 02/23/2021 1533   CO2 24 02/25/2021 0412   CO2 25 02/24/2021 0534   CO2 26 02/23/2021 1533   GLUCOSE 171 (H) 02/25/2021 0412   GLUCOSE 100 (H) 02/24/2021 0534   GLUCOSE 158 (H) 02/23/2021 1533   BUN 11 02/25/2021 0412   BUN 10 02/24/2021 0534   BUN 13 02/23/2021 1533   CREATININE 0.56 02/25/2021  0412   CREATININE 0.39 (L) 02/24/2021 0534   CREATININE 0.42 (L) 02/23/2021 1533   CALCIUM 8.2 (L) 02/25/2021 0412   CALCIUM 8.4 (L) 02/24/2021 0534   CALCIUM 8.4 (L) 02/23/2021 1533   GFRNONAA >60 02/25/2021 0412   GFRNONAA >60 02/24/2021 0534   GFRNONAA >60 02/23/2021 1533   CBC    Component Value Date/Time   WBC 11.9 (H) 02/25/2021 0412   RBC 3.27 (L) 02/25/2021 0412   HGB 9.8 (L) 02/25/2021 0412   HCT 30.5 (L) 02/25/2021 0412   PLT 204 02/25/2021 0412   MCV 93.3 02/25/2021 0412   MCH 30.0 02/25/2021 0412   MCHC 32.1 02/25/2021 0412   RDW 13.9 02/25/2021 0412   LYMPHSABS 0.7 02/21/2021 0700   MONOABS 0.6 02/21/2021 0700   EOSABS 0.1 02/21/2021 0700   BASOSABS 0.0 02/21/2021 0700   HEPATIC Function Panel Recent Labs    02/17/21 0420 02/20/21 0636 02/21/21 0700  PROT 8.5* 8.9* RESULTS UNAVAILABLE DUE TO INTERFERING SUBSTANCE   HEMOGLOBIN A1C No components found for: HGA1C,  MPG CARDIAC ENZYMES Lab Results  Component Value Date   CKTOTAL 169 02/16/2021   BNP No results for input(s): PROBNP in the last 8760 hours. TSH Recent Labs    05/08/20 1625 07/07/20 1449 02/06/21 1747  TSH 18.75* 2.56 10.095*   CHOLESTEROL Recent Labs    02/09/21 0344  CHOL 108  Scheduled Meds:  chlorhexidine gluconate (MEDLINE KIT)  15 mL Mouth Rinse BID   Chlorhexidine Gluconate Cloth  6 each Topical Daily   clonazePAM  1 mg Per Tube BID   docusate  100 mg Per Tube BID   enoxaparin (LOVENOX) injection  40 mg Subcutaneous Q24H   feeding supplement (PROSource TF)  45 mL Per Tube TID   free water  30 mL Per Tube Q4H   levothyroxine  100 mcg Per Tube Q0600   mouth rinse  15 mL Mouth Rinse 10 times per day   pantoprazole sodium  40 mg Per Tube QHS   polyethylene glycol  17 g Per Tube BID   scopolamine  1 patch Transdermal Q72H   senna-docusate  2 tablet Per NG tube BID   sodium chloride flush  10-40 mL Intracatheter Q12H   tenofovir  300 mg Per Tube Daily   thiamine  100  mg Per Tube Daily   traMADol  50 mg Per Tube Q6H   valproic acid  500 mg Per Tube Q8H   Continuous Infusions:  sodium chloride Stopped (02/23/21 1323)   sodium chloride 100 mL/hr at 02/25/21 0649   therapeutic plasma exchange solution     calcium gluconate 2,000 mg (02/25/21 1037)   ceFEPime (MAXIPIME) IV Stopped (02/25/21 0714)   citrate dextrose 1,000 mL (02/23/21 1647)   feeding supplement (OSMOLITE 1.2 CAL) 1,000 mL (02/25/21 0206)   levETIRAcetam Stopped (02/25/21 0924)   magnesium sulfate bolus IVPB 2 g (02/25/21 1008)   phenylephrine (NEO-SYNEPHRINE) Adult infusion 55 mcg/min (02/25/21 1000)   vancomycin Stopped (02/25/21 0448)   PRN Meds:.acetaminophen (TYLENOL) oral liquid 160 mg/5 mL, [DISCONTINUED] acetaminophen (TYLENOL) oral liquid 160 mg/5 mL **OR** acetaminophen, acetaminophen, atropine, diphenhydrAMINE, fentaNYL (SUBLIMAZE) injection, heparin, sodium chloride flush  Assessment/Plan:  Acute pericarditis MI less likely. Acute NMDAR encephalitis Abnormal EKG Acute respiratory failure Seizure disorder  Plan: Start small dose colchicine as patient has high dose prednisone use.    LOS: 18 days   Time spent including chart review, lab review, examination, discussion with patient/Mother/Nurse/Doctors : 30 min   Dixie Dials  MD  02/25/2021, 10:55 AM

## 2021-02-25 NOTE — Progress Notes (Signed)
EKG changes noted on monitor. 12 lead obtained with critical result. CCM made aware. Will continue to assess.

## 2021-02-25 NOTE — Progress Notes (Signed)
[facility] Progress Note Patient Name: Jamie [name] DOB: 09-27-2000 MRN: [id number]   Date of Service  02/25/2021  HPI/Events of Note  Notified of hypokalemia with K 3.3, crea 0.56.  EKG changes noted showing STEMI.   eICU Interventions  Replete K.  Cardiology Dr. Doylene Canard consulted.  Check troponin stat.     Intervention Category Minor Interventions: Electrolytes abnormality - evaluation and management  Elsie Lincoln 02/25/2021, 5:45 AM

## 2021-02-25 NOTE — Progress Notes (Signed)
EEG maintenance performed.  Event button tested.  Reapplied EKG leads.  No skin breakdown observed at electrode sites F7, F8, Fp1, Fp2, Cz, P4.

## 2021-02-25 NOTE — Progress Notes (Signed)
All EKG's reviewed and placed in shadow chart.

## 2021-02-25 NOTE — Progress Notes (Signed)
Vomited, vagal reaction requiring atropine. KUB with marked ileus, wonder if this was driving some of her arrythmias. Noted possible pericarditis with colchicine started. Add some reglan, NGT to LIS. Mother updated at bedside.  Erskine Emery MD PCCM

## 2021-02-25 NOTE — Consult Note (Signed)
Referring Physician: Charlsie Quest, MD  Jamie [name] is an 20 y.o. female.                       Chief Complaint: Abnormal EKG  HPI: 20 years old female with PMH of Hashimoto thyroiditis is admitted with altered mental status on 02/06/2021 . Extensive work up showed NMDAR encephalitis, EB virus infection with seizures. She is treated with IV solumedrol, IVIG, Keppra and Rituximab.She is intubated on 02/19/2021 for respiratory distress. Today she has wide complex EKG changes suggestive of acute MI. HS-troponin is pending. Echocardiogram is ordered.   Past Medical History:  Diagnosis Date   Acquired autoimmune hypothyroidism    Dx 12/2014, TSH 110, FT4 0.3      History reviewed. No pertinent surgical history.  Family History  Problem Relation Age of Onset   Healthy Mother    Healthy Father    Social History:  reports that she has never smoked. She has never used smokeless tobacco. She reports that she does not currently use alcohol. She reports that she does not currently use drugs.  Allergies: No Known Allergies  Medications Prior to Admission  Medication Sig Dispense Refill   levothyroxine (SYNTHROID) 100 MCG tablet Take 1 tablet (100 mcg total) by mouth daily. 90 tablet 3    Results for orders placed or performed during the hospital encounter of 02/06/21 (from the past 48 hour(s))  Glucose, capillary     Status: Abnormal   Collection Time: 02/23/21  8:24 AM  Result Value Ref Range   Glucose-Capillary 135 (H) 70 - 99 mg/dL    Comment: Glucose reference range applies only to samples taken after fasting for at least 8 hours.  Glucose, capillary     Status: Abnormal   Collection Time: 02/23/21 11:34 AM  Result Value Ref Range   Glucose-Capillary 108 (H) 70 - 99 mg/dL    Comment: Glucose reference range applies only to samples taken after fasting for at least 8 hours.  Basic metabolic panel     Status: Abnormal   Collection Time: 02/23/21  3:33 PM  Result Value Ref Range    Sodium 138 135 - 145 mmol/L   Potassium 3.8 3.5 - 5.1 mmol/L   Chloride 106 98 - 111 mmol/L   CO2 26 22 - 32 mmol/L   Glucose, Bld 158 (H) 70 - 99 mg/dL    Comment: Glucose reference range applies only to samples taken after fasting for at least 8 hours.   BUN 13 6 - 20 mg/dL   Creatinine, Ser 0.42 (L) 0.44 - 1.00 mg/dL   Calcium 8.4 (L) 8.9 - 10.3 mg/dL   GFR, Estimated >60 >60 mL/min    Comment: (NOTE) Calculated using the CKD-EPI Creatinine Equation (2021)    Anion gap 6 5 - 15    Comment: Performed at [facility] [address] [place], [place] [address]  Glucose, capillary     Status: Abnormal   Collection Time: 02/23/21  3:36 PM  Result Value Ref Range   Glucose-Capillary 141 (H) 70 - 99 mg/dL    Comment: Glucose reference range applies only to samples taken after fasting for at least 8 hours.  Glucose, capillary     Status: Abnormal   Collection Time: 02/23/21  9:13 PM  Result Value Ref Range   Glucose-Capillary 133 (H) 70 - 99 mg/dL    Comment: Glucose reference range applies only to samples taken after fasting for at least 8  hours.  Glucose, capillary     Status: Abnormal   Collection Time: 02/23/21 11:36 PM  Result Value Ref Range   Glucose-Capillary 106 (H) 70 - 99 mg/dL    Comment: Glucose reference range applies only to samples taken after fasting for at least 8 hours.  Glucose, capillary     Status: Abnormal   Collection Time: 02/24/21  3:30 AM  Result Value Ref Range   Glucose-Capillary 121 (H) 70 - 99 mg/dL    Comment: Glucose reference range applies only to samples taken after fasting for at least 8 hours.  CBC     Status: Abnormal   Collection Time: 02/24/21  5:34 AM  Result Value Ref Range   WBC 10.3 4.0 - 10.5 K/uL   RBC 3.55 (L) 3.87 - 5.11 MIL/uL   Hemoglobin 10.6 (L) 12.0 - 15.0 g/dL   HCT 33.7 (L) 36.0 - 46.0 %   MCV 94.9 80.0 - 100.0 fL   MCH 29.9 26.0 - 34.0 pg   MCHC 31.5 30.0 - 36.0 g/dL   RDW 13.9 11.5 - 15.5 %   Platelets 192 150 -  400 K/uL   nRBC 0.0 0.0 - 0.2 %    Comment: Performed at [facility], [place] [address] [place], [place] [address]  Basic metabolic panel     Status: Abnormal   Collection Time: 02/24/21  5:34 AM  Result Value Ref Range   Sodium 139 135 - 145 mmol/L   Potassium 3.6 3.5 - 5.1 mmol/L   Chloride 108 98 - 111 mmol/L   CO2 25 22 - 32 mmol/L   Glucose, Bld 100 (H) 70 - 99 mg/dL    Comment: Glucose reference range applies only to samples taken after fasting for at least 8 hours.   BUN 10 6 - 20 mg/dL   Creatinine, Ser 0.39 (L) 0.44 - 1.00 mg/dL   Calcium 8.4 (L) 8.9 - 10.3 mg/dL   GFR, Estimated >60 >60 mL/min    Comment: (NOTE) Calculated using the CKD-EPI Creatinine Equation (2021)    Anion gap 6 5 - 15    Comment: Performed at [facility] [address] [place], [place] [address]  Procalcitonin - Baseline     Status: None   Collection Time: 02/24/21  5:34 AM  Result Value Ref Range   Procalcitonin 0.22 ng/mL    Comment:        Interpretation: PCT (Procalcitonin) <= 0.5 ng/mL: Systemic infection (sepsis) is not likely. Local bacterial infection is possible. (NOTE)       Sepsis PCT Algorithm           Lower Respiratory Tract                                      Infection PCT Algorithm    ----------------------------     ----------------------------         PCT < 0.25 ng/mL                PCT < 0.10 ng/mL          Strongly encourage             Strongly discourage   discontinuation of antibiotics    initiation of antibiotics    ----------------------------     -----------------------------       PCT 0.25 - 0.50 ng/mL  PCT 0.10 - 0.25 ng/mL               OR       >80% decrease in PCT            Discourage initiation of                                            antibiotics      Encourage discontinuation           of antibiotics    ----------------------------     -----------------------------         PCT >= 0.50 ng/mL              PCT 0.26 - 0.50 ng/mL                AND        <80% decrease in PCT             Encourage initiation of                                             antibiotics       Encourage continuation           of antibiotics    ----------------------------     -----------------------------        PCT >= 0.50 ng/mL                  PCT > 0.50 ng/mL               AND         increase in PCT                  Strongly encourage                                      initiation of antibiotics    Strongly encourage escalation           of antibiotics                                     -----------------------------                                           PCT <= 0.25 ng/mL                                                 OR                                        > 80% decrease in PCT  Discontinue / Do not initiate                                             antibiotics  Performed at [facility], [place] [address] [place], [place] [address]   Glucose, capillary     Status: Abnormal   Collection Time: 02/24/21  8:16 AM  Result Value Ref Range   Glucose-Capillary 140 (H) 70 - 99 mg/dL    Comment: Glucose reference range applies only to samples taken after fasting for at least 8 hours.  Culture, Respiratory w Gram Stain     Status: None (Preliminary result)   Collection Time: 02/24/21 11:28 AM   Specimen: Tracheal Aspirate; Respiratory  Result Value Ref Range   Specimen Description TRACHEAL ASPIRATE    Special Requests NONE    Gram Stain      FEW SQUAMOUS EPITHELIAL CELLS PRESENT ABUNDANT WBC SEEN ABUNDANT GRAM POSITIVE COCCI Performed at [facility], [place] [address] [place], [place] [address]    Culture PENDING    Report Status PENDING   Glucose, capillary     Status: Abnormal   Collection Time: 02/24/21 12:04 PM  Result Value Ref Range   Glucose-Capillary 104 (H) 70 - 99 mg/dL    Comment: Glucose reference range applies only to samples taken after fasting for at least  8 hours.  Glucose, capillary     Status: Abnormal   Collection Time: 02/24/21  3:51 PM  Result Value Ref Range   Glucose-Capillary 145 (H) 70 - 99 mg/dL    Comment: Glucose reference range applies only to samples taken after fasting for at least 8 hours.  Glucose, capillary     Status: Abnormal   Collection Time: 02/24/21  7:49 PM  Result Value Ref Range   Glucose-Capillary 138 (H) 70 - 99 mg/dL    Comment: Glucose reference range applies only to samples taken after fasting for at least 8 hours.  Glucose, capillary     Status: Abnormal   Collection Time: 02/24/21 11:39 PM  Result Value Ref Range   Glucose-Capillary 128 (H) 70 - 99 mg/dL    Comment: Glucose reference range applies only to samples taken after fasting for at least 8 hours.  Glucose, capillary     Status: Abnormal   Collection Time: 02/25/21  3:45 AM  Result Value Ref Range   Glucose-Capillary 134 (H) 70 - 99 mg/dL    Comment: Glucose reference range applies only to samples taken after fasting for at least 8 hours.  Procalcitonin     Status: None   Collection Time: 02/25/21  4:12 AM  Result Value Ref Range   Procalcitonin 0.22 ng/mL    Comment:        Interpretation: PCT (Procalcitonin) <= 0.5 ng/mL: Systemic infection (sepsis) is not likely. Local bacterial infection is possible. (NOTE)       Sepsis PCT Algorithm           Lower Respiratory Tract                                      Infection PCT Algorithm    ----------------------------     ----------------------------         PCT < 0.25 ng/mL  PCT < 0.10 ng/mL          Strongly encourage             Strongly discourage   discontinuation of antibiotics    initiation of antibiotics    ----------------------------     -----------------------------       PCT 0.25 - 0.50 ng/mL            PCT 0.10 - 0.25 ng/mL               OR       >80% decrease in PCT            Discourage initiation of                                            antibiotics       Encourage discontinuation           of antibiotics    ----------------------------     -----------------------------         PCT >= 0.50 ng/mL              PCT 0.26 - 0.50 ng/mL               AND        <80% decrease in PCT             Encourage initiation of                                             antibiotics       Encourage continuation           of antibiotics    ----------------------------     -----------------------------        PCT >= 0.50 ng/mL                  PCT > 0.50 ng/mL               AND         increase in PCT                  Strongly encourage                                      initiation of antibiotics    Strongly encourage escalation           of antibiotics                                     -----------------------------                                           PCT <= 0.25 ng/mL                                                 OR                                        >  80% decrease in PCT                                      Discontinue / Do not initiate                                             antibiotics  Performed at [facility], [place] [address], [place], [place] [address]   CBC     Status: Abnormal   Collection Time: 02/25/21  4:12 AM  Result Value Ref Range   WBC 11.9 (H) 4.0 - 10.5 K/uL   RBC 3.27 (L) 3.87 - 5.11 MIL/uL   Hemoglobin 9.8 (L) 12.0 - 15.0 g/dL   HCT 30.5 (L) 36.0 - 46.0 %   MCV 93.3 80.0 - 100.0 fL   MCH 30.0 26.0 - 34.0 pg   MCHC 32.1 30.0 - 36.0 g/dL   RDW 13.9 11.5 - 15.5 %   Platelets 204 150 - 400 K/uL   nRBC 0.0 0.0 - 0.2 %    Comment: Performed at [facility], [place] [address] [place], [place] [address]  Basic metabolic panel     Status: Abnormal   Collection Time: 02/25/21  4:12 AM  Result Value Ref Range   Sodium 145 135 - 145 mmol/L   Potassium 3.3 (L) 3.5 - 5.1 mmol/L   Chloride 114 (H) 98 - 111 mmol/L   CO2 24 22 - 32 mmol/L   Glucose, Bld 171 (H) 70 - 99 mg/dL    Comment: Glucose reference  range applies only to samples taken after fasting for at least 8 hours.   BUN 11 6 - 20 mg/dL   Creatinine, Ser 0.56 0.44 - 1.00 mg/dL   Calcium 8.2 (L) 8.9 - 10.3 mg/dL   GFR, Estimated >60 >60 mL/min    Comment: (NOTE) Calculated using the CKD-EPI Creatinine Equation (2021)    Anion gap 7 5 - 15    Comment: Performed at [facility] [address], [place], [place] [address]   DG Chest 1 View  Result Date: 02/24/2021 CLINICAL DATA:  Altered mental status.  Pneumonia. EXAM: CHEST  1 VIEW COMPARISON:  02/20/2021 FINDINGS: Endotracheal tube tip 4 cm above the carina. Left arm PICC tip in the SVC 2 cm above the right atrium. Orogastric or nasogastric tube enters the stomach. Right internal jugular central line tip in the SVC at the azygos level. Right lung remains clear. The patient has developed left lower lobe consolidation and some volume loss. IMPRESSION: Lines and tubes well positioned. Left lower lobe pneumonia now present. Electronically Signed   By: Nelson Chimes M.D.   On: 02/24/2021 14:08   [place] EKG SITE RITE  Result Date: 02/23/2021 If Site Rite image not attached, placement could not be confirmed due to current cardiac rhythm.   Review Of Systems Unable to obtain.   Blood pressure (!) 111/59, pulse 92, temperature (!) 100.9 F (38.3 C), temperature source Axillary, resp. rate 19, height 5\' 2"  (1.575 m), weight 56.7 kg, last menstrual period 01/11/2021, SpO2 100 %. Body mass index is 22.86 kg/m. General appearance: Sedated and intubated, appears stated age and in respiratory distress. Head: Normocephalic, atraumatic. Eyes: Brown, pink conjunctiva, corneas clear.  Neck: No adenopathy, no carotid bruit, no  JVD, supple, symmetrical, trachea midline and thyroid not enlarged. Resp: Clear to auscultation bilaterally. Cardio: Regular rate and rhythm, S1, S2 normal, II/VI systolic murmur, no click, rub or gallop GI: Soft. Extremities: No edema, cyanosis or clubbing. Skin: Warm  and dry.  Neurologic: Alert and oriented X 0.  Assessment/Plan Possible acute MI Altered mental status Acute respiratory failure with hypoxia  S/P intubation and ventilator support Seizures  Plan: Agree with checking Troponin. Stat echocardiogram for wall motion abnormality IV fluids/Potassium supplement  Time spent: Review of old records, Lab, x-rays, EKG, other cardiac tests, examination, discussion with patient/Family/STEMI doctor over 70 minutes.  Birdie Riddle, MD  02/25/2021, 7:04 AM

## 2021-02-25 NOTE — Progress Notes (Signed)
Neurology progress note  S:  Seen and examined. Overnight irregular heart rhythm and conduction delays and question of ST elevation with negative trops Cardiology consulting, echocardiogram pending.  Examination  Vital signs in last 24 hours: Temp:  [98.1 F (36.7 C)-103.2 F (39.6 C)] 101.9 F (38.8 C) (11/03 0800) Pulse Rate:  [64-108] 74 (11/03 0830) Resp:  [15-41] 20 (11/03 0830) BP: (82-156)/(41-115) 100/59 (11/03 0830) SpO2:  [95 %-100 %] 100 % (11/03 0830) FiO2 (%):  [30 %] 30 % (11/03 0830) Weight:  [56.7 kg] 56.7 kg (11/03 0349)  General: lying in bed, NAD CVS: pulse-normal rate and rhythm RS: Intubated, CTAB Extremities: normal, warm Neuro: On Precedex, comatose, does not open eyes to noxious stimuli, pupils reacting to light, corneal reflex intact, gag reflex intact, does not withdraw to noxious stimuli in all extremities, flaccid, areflexic  Basic Metabolic Panel: Recent Labs  Lab 02/19/21 0334 02/19/21 1719 02/21/21 0700 02/23/21 0458 02/23/21 1533 02/24/21 0534 02/25/21 0412  NA 132*   < > 136 140 138 139 145  K 3.8   < > 4.2 3.4* 3.8 3.6 3.3*  CL 100   < > 108 107 106 108 114*  CO2 26   < > 22 25 26 25 24   GLUCOSE 101*   < > 85 124* 158* 100* 171*  BUN 20   < > 12 15 13 10 11   CREATININE 0.59   < > 0.44 0.45 0.42* 0.39* 0.56  CALCIUM 8.9   < > 7.6* 8.8* 8.4* 8.4* 8.2*  MG 2.3  --   --   --   --   --   --   PHOS 3.5  --   --   --   --   --   --    < > = values in this interval not displayed.     CBC: Recent Labs  Lab 02/19/21 0334 02/19/21 1719 02/20/21 0636 02/21/21 0700 02/23/21 0458 02/24/21 0534 02/25/21 0412  WBC 7.6  --  6.6 5.4 7.0 10.3 11.9*  NEUTROABS 6.1  --  4.4 4.1  --   --   --   HGB 10.8*   < > 11.6* 9.7* 10.0* 10.6* 9.8*  HCT 33.6*   < > 35.8* 29.4* 31.6* 33.7* 30.5*  MCV 93.1  --  93.2 94.5 94.3 94.9 93.3  PLT 268  --  249 236 173 192 204   < > = values in this interval not displayed.    Overnight EEG ABNORMALITY -  Periodic discharges with overriding fast activity, generalized ( GPD+) - Delta brush, generalized - Continuous slow, generalized  IMPRESSION: This study showed evidence of epileptogenicity with generalized onset, maximal bilateral posterior quadrant. 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 Per RN patient had right arm and facial twitching which was difficult to visualize on camera. No concomitant EEG change was seen to suggest seizure. However, focal motor seizures may not be seen on scalp eeg.    Patient event button was pressed on 02/25/2021 at 0526 for left arm twitching. Concomitant eeg didn't show any eeg change to suggest seizure. However, focal motor seizures may not be seen on scalp eeg.    Imaging MRI brain with and without contrast 02/12/2021: No acute abnormality.  ASSESSMENT: 20 y.o. female with PMHx of Hashimoto thyroiditis who presented to the ED 10/15 for evaluation  of vomiting, headache, poor p.o. intake for 4 days and decreased verbalization 10/15 prior to arrival. Initial concerns for Hashimoto's encephalitis but extensive workup revealed CSF pleocytosis (273, mononuclear), positive NMDA receptor IgG, elevated IgG index and 3 oligoclonal bands in the CSF that are not noted in the serum. R frontal seizure on cEEG on 10/25, has not had any since she has been on Keppra. 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 She completed 5 days of high dose IV Solumedrol and completed IVIG x 5 doses.   Has had 2 PLEX and undergoing 3rd round today. Has had concerns for ST elevation on EKG-cardiology following.  IMP:  NMDA encephalitis Acute encephalopathy, due to NMDA encephalitis and  sedation and seizures New onset seizures Acute respiratory failure Dyskinesias  Recommendations -Increase clonazepam to 1 mg twice daily-for possible worsening of dyskinesias -Depakote 500 mg every 8 hours and Keppra 1500 mg twice daily -Continue LTM EEG - PLEX - round 3 today - DC propofol and start precedex -Clonazepam will help with dyskinesias as well.  This can be titrated up if dyskinesias persist or worsen once sedation is weaned off. -Discussed plan in detail with patient's mother and PCCM team as well as epileptologist Dr Hortense Ramal  CRITICAL CARE ATTESTATION Performed by: Amie Portland, MD Total critical care time: 31 minutes Critical care time was exclusive of separately billable procedures and treating other patients and/or supervising APPs/Residents/Students Critical care was necessary to treat or prevent imminent or life-threatening deterioration due to NMDA encephalitis This patient is critically ill and at significant risk for neurological worsening and/or death and care requires constant monitoring. Critical care was time spent personally by me on the following activities: development of treatment plan with patient and/or surrogate as well as nursing, discussions with consultants, evaluation of patient's response to treatment, examination of patient, obtaining history from patient or surrogate, ordering and performing treatments and interventions, ordering and review of laboratory studies, ordering and review of radiographic studies, pulse oximetry, re-evaluation of patient's condition, participation in multidisciplinary rounds and medical decision making of high complexity in the care of this patient.

## 2021-02-25 NOTE — Progress Notes (Signed)
Subjective:  Patient on ventilator   Antibiotics:  Anti-infectives (From admission, onward)    Start     Dose/Rate Route Frequency Ordered Stop   02/25/21 1515  ceFAZolin (ANCEF) IVPB 2g/100 mL premix        2 g 200 mL/hr over 30 Minutes Intravenous Every 8 hours 02/25/21 1425     02/25/21 0400  vancomycin (VANCOREADY) IVPB 750 mg/150 mL        750 mg 150 mL/hr over 60 Minutes Intravenous Every 12 hours 02/24/21 1514     02/24/21 1515  ceFEPIme (MAXIPIME) 2 g in sodium chloride 0.9 % 100 mL IVPB  Status:  Discontinued        2 g 200 mL/hr over 30 Minutes Intravenous Every 8 hours 02/24/21 1416 02/25/21 1349   02/24/21 1515  vancomycin (VANCOREADY) IVPB 1250 mg/250 mL        1,250 mg 166.7 mL/hr over 90 Minutes Intravenous  Once 02/24/21 1416 02/24/21 1752   02/20/21 1200  tenofovir (VIREAD) tablet 300 mg        300 mg Per Tube Daily 02/20/21 1113         Medications: Scheduled Meds:  chlorhexidine gluconate (MEDLINE KIT)  15 mL Mouth Rinse BID   Chlorhexidine Gluconate Cloth  6 each Topical Daily   clonazePAM  1 mg Per Tube BID   colchicine  0.6 mg Per Tube Daily   docusate  100 mg Per Tube BID   enoxaparin (LOVENOX) injection  40 mg Subcutaneous Q24H   feeding supplement (PROSource TF)  45 mL Per Tube TID   free water  30 mL Per Tube Q4H   levothyroxine  100 mcg Per Tube Q0600   mouth rinse  15 mL Mouth Rinse 10 times per day   pantoprazole sodium  40 mg Per Tube QHS   polyethylene glycol  17 g Per Tube BID   scopolamine  1 patch Transdermal Q72H   senna-docusate  2 tablet Per NG tube BID   sodium chloride flush  10-40 mL Intracatheter Q12H   tenofovir  300 mg Per Tube Daily   thiamine  100 mg Per Tube Daily   traMADol  50 mg Per Tube Q6H   valproic acid  500 mg Per Tube Q8H   Continuous Infusions:  sodium chloride Stopped (02/23/21 1323)   sodium chloride 100 mL/hr at 02/25/21 1400    ceFAZolin (ANCEF) IV 2 g (02/25/21 1519)   citrate dextrose  1,000 mL (02/23/21 1647)   feeding supplement (OSMOLITE 1.2 CAL) Stopped (02/25/21 1400)   levETIRAcetam Stopped (02/25/21 0924)   phenylephrine (NEO-SYNEPHRINE) Adult infusion 120 mcg/min (02/25/21 1400)   vancomycin 750 mg (02/25/21 1604)   PRN Meds:.acetaminophen (TYLENOL) oral liquid 160 mg/5 mL, [DISCONTINUED] acetaminophen (TYLENOL) oral liquid 160 mg/5 mL **OR** acetaminophen, acetaminophen, atropine, diphenhydrAMINE, fentaNYL (SUBLIMAZE) injection, heparin, sodium chloride flush    Objective: Weight change: 2.1 kg  Intake/Output Summary (Last 24 hours) at 02/25/2021 1619 Last data filed at 02/25/2021 1400 Gross per 24 hour  Intake 3362 ml  Output 2250 ml  Net 1112 ml    Blood pressure 109/61, pulse 77, temperature 99 F (37.2 C), temperature source Axillary, resp. rate 16, height 5' 2"  (1.575 m), weight 58 kg, last menstrual period 01/11/2021, SpO2 100 %. Temp:  [98.5 F (36.9 C)-103.2 F (39.6 C)] 99 F (37.2 C) (11/03 1200) Pulse Rate:  [35-122] 77 (11/03 1600) Resp:  [15-41] 16 (11/03 1600) BP: (82-132)/(41-87) 109/61 (  11/03 1600) SpO2:  [95 %-100 %] 100 % (11/03 1600) FiO2 (%):  [30 %] 30 % (11/03 1555) Weight:  [56.7 kg-58 kg] 58 kg (11/03 1031)  Physical Exam: Physical Exam Constitutional:      Appearance: She is ill-appearing.     Interventions: She is intubated.  HENT:     Head: Normocephalic and atraumatic.  Eyes:     General:        Right eye: No discharge.     Extraocular Movements: Extraocular movements intact.  Cardiovascular:     Rate and Rhythm: Tachycardia present.  Pulmonary:     Effort: She is intubated.     Breath sounds: No wheezing.  Skin:    General: Skin is warm.    Encephalopathic    CBC:    BMET Recent Labs    02/25/21 0412 02/25/21 1001  NA 145 145  K 3.3* 4.1  CL 114* 116*  CO2 24 22  GLUCOSE 171* 169*  BUN 11 10  CREATININE 0.56 0.43*  CALCIUM 8.2* 8.4*      Liver Panel  No results for input(s): PROT,  ALBUMIN, AST, ALT, ALKPHOS, BILITOT, BILIDIR, IBILI in the last 72 hours.      Sedimentation Rate No results for input(s): ESRSEDRATE in the last 72 hours. C-Reactive Protein No results for input(s): CRP in the last 72 hours.  Micro Results: Recent Results (from the past 720 hour(s))  Urine Culture     Status: Abnormal   Collection Time: 02/07/21  6:08 AM   Specimen: Urine, Clean Catch  Result Value Ref Range Status   Specimen Description URINE, CLEAN CATCH  Final   Special Requests   Final    NONE Performed at [facility], [address], [place], [place] [address]    Culture MULTIPLE SPECIES PRESENT, SUGGEST RECOLLECTION (A)  Final   Report Status 02/08/2021 FINAL  Final  Resp Panel by RT-PCR (Flu A&B, Covid) Nasopharyngeal Swab     Status: None   Collection Time: 02/07/21 10:12 AM   Specimen: Nasopharyngeal Swab; Nasopharyngeal(NP) swabs in vial transport medium  Result Value Ref Range Status   SARS Coronavirus 2 by RT PCR NEGATIVE NEGATIVE Final    Comment: (NOTE) SARS-CoV-2 target nucleic acids are NOT DETECTED.  The SARS-CoV-2 RNA is generally detectable in upper respiratory specimens during the acute phase of infection. The lowest concentration of SARS-CoV-2 viral copies this assay can detect is 138 copies/mL. A negative result does not preclude SARS-Cov-2 infection and should not be used as the sole basis for treatment or other patient management decisions. A negative result may occur with  improper specimen collection/handling, submission of specimen other than nasopharyngeal swab, presence of viral mutation(s) within the areas targeted by this assay, and inadequate number of viral copies(<138 copies/mL). A negative result must be combined with clinical observations, patient history, and epidemiological information. The expected result is Negative.  Fact Sheet for Patients:  EntrepreneurPulse.com.au  Fact Sheet for Healthcare Providers:   IncredibleEmployment.be  This test is no t yet approved or cleared by the [place] FDA and  has been authorized for detection and/or diagnosis of SARS-CoV-2 by FDA under an Emergency Use Authorization (EUA). This EUA will remain  in effect (meaning this test can be used) for the duration of the COVID-19 declaration under Section 564(b)(1) of the Act, 21 U.S.C.section 360bbb-3(b)(1), unless the authorization is terminated  or revoked sooner.       Influenza A by PCR NEGATIVE NEGATIVE Final  Influenza B by PCR NEGATIVE NEGATIVE Final    Comment: (NOTE) The Xpert Xpress SARS-CoV-2/FLU/RSV plus assay is intended as an aid in the diagnosis of influenza from Nasopharyngeal swab specimens and should not be used as a sole basis for treatment. Nasal washings and aspirates are unacceptable for Xpert Xpress SARS-CoV-2/FLU/RSV testing.  Fact Sheet for Patients: EntrepreneurPulse.com.au  Fact Sheet for Healthcare Providers: IncredibleEmployment.be  This test is not yet approved or cleared by the [place] FDA and has been authorized for detection and/or diagnosis of SARS-CoV-2 by FDA under an Emergency Use Authorization (EUA). This EUA will remain in effect (meaning this test can be used) for the duration of the COVID-19 declaration under Section 564(b)(1) of the Act, 21 U.S.C. section 360bbb-3(b)(1), unless the authorization is terminated or revoked.  Performed at [facility], [place] [address] [place], [place] [address]   CSF culture w Gram Stain     Status: None   Collection Time: 02/08/21  3:31 PM   Specimen: CSF; Cerebrospinal Fluid  Result Value Ref Range Status   Specimen Description CSF  Final   Special Requests NONE  Final   Gram Stain   Final    WBC PRESENT, PREDOMINANTLY MONONUCLEAR NO ORGANISMS SEEN CYTOSPIN SMEAR    Culture   Final    NO GROWTH 3 DAYS Performed at [facility], [place]  [address], [place], [place] [address]    Report Status 02/12/2021 FINAL  Final  Culture, fungus without smear     Status: None (Preliminary result)   Collection Time: 02/08/21  3:31 PM   Specimen: CSF; Cerebrospinal Fluid  Result Value Ref Range Status   Specimen Description CSF  Final   Special Requests NONE  Final   Culture   Final    NO FUNGUS ISOLATED AFTER 17 DAYS Performed at [facility], [place] [address], [place], [place] [address]    Report Status PENDING  Incomplete  VZV PCR, CSF     Status: None   Collection Time: 02/08/21  3:31 PM   Specimen: Cerebrospinal Fluid  Result Value Ref Range Status   VZV PCR, CSF Negative Negative Final    Comment: (NOTE) No Varicella Zoster Virus DNA detected. Performed At: [facility] [place], [place] [id number] Rush Farmer MD RW:[id number]   Culture, blood (routine x 2)     Status: None   Collection Time: 02/17/21  8:45 PM   Specimen: BLOOD RIGHT HAND  Result Value Ref Range Status   Specimen Description BLOOD RIGHT HAND  Final   Special Requests   Final    BOTTLES DRAWN AEROBIC AND ANAEROBIC Blood Culture adequate volume   Culture   Final    NO GROWTH 5 DAYS Performed at [facility], [place] [address] [place], [place] [address]    Report Status 02/22/2021 FINAL  Final  Culture, blood (routine x 2)     Status: None   Collection Time: 02/17/21  8:45 PM   Specimen: BLOOD LEFT HAND  Result Value Ref Range Status   Specimen Description BLOOD LEFT HAND  Final   Special Requests   Final    BOTTLES DRAWN AEROBIC AND ANAEROBIC Blood Culture adequate volume   Culture   Final    NO GROWTH 5 DAYS Performed at [facility], [place] [address], [place], [place] [address]    Report Status 02/22/2021 FINAL  Final  MRSA Next Gen by PCR, Nasal  Status: None   Collection Time: 02/19/21  4:03 PM   Specimen: Nasal Mucosa; Nasal Swab  Result Value Ref Range Status   MRSA by PCR Next Gen NOT DETECTED NOT  DETECTED Final    Comment: (NOTE) The GeneXpert MRSA Assay (FDA approved for NASAL specimens only), is one component of a comprehensive MRSA colonization surveillance program. It is not intended to diagnose MRSA infection nor to guide or monitor treatment for MRSA infections. Test performance is not FDA approved in patients less than 64 years old. Performed at [facility], [place] [address] [place], [place] [address]   Culture, Respiratory w Gram Stain     Status: None (Preliminary result)   Collection Time: 02/24/21 11:28 AM   Specimen: Tracheal Aspirate; Respiratory  Result Value Ref Range Status   Specimen Description TRACHEAL ASPIRATE  Final   Special Requests NONE  Final   Gram Stain   Final    FEW SQUAMOUS EPITHELIAL CELLS PRESENT ABUNDANT WBC SEEN ABUNDANT GRAM POSITIVE COCCI Performed at [facility], [place] [address], [place], [place] [address]    Culture ABUNDANT STAPHYLOCOCCUS AUREUS  Final   Report Status PENDING  Incomplete    Studies/Results: DG Chest 1 View  Result Date: 02/24/2021 CLINICAL DATA:  Altered mental status.  Pneumonia. EXAM: CHEST  1 VIEW COMPARISON:  02/20/2021 FINDINGS: Endotracheal tube tip 4 cm above the carina. Left arm PICC tip in the SVC 2 cm above the right atrium. Orogastric or nasogastric tube enters the stomach. Right internal jugular central line tip in the SVC at the azygos level. Right lung remains clear. The patient has developed left lower lobe consolidation and some volume loss. IMPRESSION: Lines and tubes well positioned. Left lower lobe pneumonia now present. Electronically Signed   By: Nelson Chimes M.D.   On: 02/24/2021 14:08   DG CHEST PORT 1 VIEW  Result Date: 02/25/2021 CLINICAL DATA:  Respiratory failure EXAM: PORTABLE CHEST 1 VIEW COMPARISON:  Chest x-ray 02/24/2021 FINDINGS: Endotracheal tube tip is 1.8 cm above the carina. Left PICC tip in the SVC. Right internal jugular line tip in proximal SVC. Enteric tube tip below  the diaphragm. Heart size is normal. Mediastinum appears stable. Right lung and pleura remain clear. Persistent hazy opacities in the left mid to lower lung zone. No significant pleural effusion. No pneumothorax. IMPRESSION: 1. Medical devices as described. 2. Persistent opacities/infiltrates in the left lung. Electronically Signed   By: Ofilia Neas M.D.   On: 02/25/2021 08:14   ECHOCARDIOGRAM COMPLETE  Result Date: 02/25/2021    ECHOCARDIOGRAM REPORT   Patient Name:   Jamie [name] Date of Exam: 02/25/2021 Medical Rec #:  [id number]           Height:       62.0 in Accession #:    [id number]          Weight:       125.0 lb Date of Birth:  09/13/2000            BSA:          1.566 m Patient Age:    20 years            BP:           105/79 mmHg Patient Gender: F                   HR:           64 bpm. Exam Location:  Inpatient  Procedure: 2D Echo, Cardiac Doppler and Color Doppler Indications:     MI  History:         Patient has no prior history of Echocardiogram examinations.  Sonographer:     Jyl Heinz Referring Phys:  Dana Diagnosing Phys: Dixie Dials MD IMPRESSIONS  1. Left ventricular ejection fraction, by estimation, is 60 to 65%. The left ventricle has normal function. The left ventricle has no regional wall motion abnormalities. Left ventricular diastolic parameters were normal.  2. Right ventricular systolic function is normal. The right ventricular size is normal.  3. A small pericardial effusion is present. The pericardial effusion is posterior to the left ventricle.  4. The mitral valve is normal in structure. Trivial mitral valve regurgitation.  5. Tricuspid valve regurgitation is mild to moderate.  6. The aortic valve is tricuspid. Aortic valve regurgitation is not visualized.  7. The inferior vena cava is normal in size with greater than 50% respiratory variability, suggesting right atrial pressure of 3 mmHg. FINDINGS  Left Ventricle: Left ventricular ejection fraction, by  estimation, is 60 to 65%. The left ventricle has normal function. The left ventricle has no regional wall motion abnormalities. The left ventricular internal cavity size was normal in size. There is  no left ventricular hypertrophy. Left ventricular diastolic parameters were normal. Right Ventricle: The right ventricular size is normal. No increase in right ventricular wall thickness. Right ventricular systolic function is normal. Left Atrium: Left atrial size was normal in size. Right Atrium: Right atrial size was normal in size. Pericardium: A small pericardial effusion is present. The pericardial effusion is posterior to the left ventricle. Mitral Valve: The mitral valve is normal in structure. Trivial mitral valve regurgitation. Tricuspid Valve: The tricuspid valve is normal in structure. Tricuspid valve regurgitation is mild to moderate. Aortic Valve: The aortic valve is tricuspid. Aortic valve regurgitation is not visualized. Aortic valve peak gradient measures 8.0 mmHg. Pulmonic Valve: The pulmonic valve was normal in structure. Pulmonic valve regurgitation is not visualized. Aorta: The aortic root is normal in size and structure. Venous: The inferior vena cava is normal in size with greater than 50% respiratory variability, suggesting right atrial pressure of 3 mmHg. IAS/Shunts: The atrial septum is grossly normal. Additional Comments: There is a small pleural effusion.  LEFT VENTRICLE PLAX 2D LVIDd:         4.10 cm     Diastology LVIDs:         2.00 cm     LV e' medial:    14.70 cm/s LV PW:         0.80 cm     LV E/e' medial:  6.4 LV IVS:        0.70 cm     LV e' lateral:   25.40 cm/s LVOT diam:     1.80 cm     LV E/e' lateral: 3.7 LV SV:         56 LV SV Index:   36 LVOT Area:     2.54 cm  LV Volumes (MOD) LV vol d, MOD A2C: 77.8 ml LV vol d, MOD A4C: 81.7 ml LV vol s, MOD A2C: 32.9 ml LV vol s, MOD A4C: 33.6 ml LV SV MOD A2C:     44.9 ml LV SV MOD A4C:     81.7 ml LV SV MOD BP:      46.4 ml RIGHT  VENTRICLE             IVC  RV Basal diam:  2.80 cm     IVC diam: 1.20 cm RV Mid diam:    2.80 cm RV S prime:     23.30 cm/s TAPSE (M-mode): 2.4 cm LEFT ATRIUM           Index        RIGHT ATRIUM          Index LA diam:      2.30 cm 1.47 cm/m   RA Area:     9.74 cm LA Vol (A2C): 24.1 ml 15.39 ml/m  RA Volume:   19.50 ml 12.46 ml/m LA Vol (A4C): 19.2 ml 12.26 ml/m  AORTIC VALVE AV Area (Vmax): 2.39 cm AV Vmax:        141.00 cm/s AV Peak Grad:   8.0 mmHg LVOT Vmax:      132.50 cm/s LVOT Vmean:     96.600 cm/s LVOT VTI:       0.219 m  AORTA Ao Root diam: 2.20 cm Ao Asc diam:  2.20 cm MITRAL VALVE               TRICUSPID VALVE MV Area (PHT): 7.37 cm    TR Peak grad:   25.4 mmHg MV Decel Time: 103 msec    TR Vmax:        252.00 cm/s MV E velocity: 93.40 cm/s                            SHUNTS                            Systemic VTI:  0.22 m                            Systemic Diam: 1.80 cm Dixie Dials MD Electronically signed by Dixie Dials MD Signature Date/Time: 02/25/2021/9:49:28 AM    Final       Assessment/Plan:  INTERVAL HISTORY:   Patient has been started on vancomycin and cefepime for VAP   Principal Problem:   Encephalitis Active Problems:   Primary hypothyroidism   Catatonia associated with another mental disorder    Jamie [name] is a 20 y.o. 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  Unfortunately she has not responded to corticosteroids and had seizures on IVIG.  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   IF rituxan still being considered would leave Viread on board   #3  QuantiFERON gold that is indeterminant: we repeated this test but again in context of  IVIG  #4 anti-NMDA encephalitis: sp plasmaphoresis   #5 VAP: She is growing Staphylococcus aureus so she can be narrowed to vancomycin and cefazolin and would narrow to  ancef if MSSA vs DC ancef if it is MRSA  I will sign off for now  Her EBV  PCR quant will come back to me  Please call with further questions.        LOS: 18 days   Alcide Evener 02/25/2021, 4:19 PM

## 2021-02-25 NOTE — Progress Notes (Signed)
NAME:  Jamie [name], MRN:  [id number], DOB:  Apr 05, 2001, LOS: 34 ADMISSION DATE:  02/06/2021, CONSULTATION DATE:  10/28 REFERRING MD:  Raymondo Band, CHIEF COMPLAINT:  Seizure, acute metabolic encephalopathy   History of Present Illness:  20 y/o female admitted with seizure and change in mental status.  She was treated with steroids and IVIg but despite this her condition worsened.  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  After treatment her mental status worsened and she developed inability to protect her airway in the setting of tachypnea and tachycardia.  She required intubation on 10/28.   Pertinent  Medical History  Acquired autoimmune hypothyroidism  Significant Hospital Events: Including procedures, antibiotic start and stop dates in addition to other pertinent events   10/16 Admit 10/25 New onset seizure activity, PCCM consult 1026 pulmonary critical care signed off 02/19/2021 pulmonary critical care reconsulted for suspected respiratory distress.  intubated, continuous EEG started 10/30 starting plasmapheresis 10/31 MRI normal, EEG with GPD+ 11/1 RN reports pt seized while in MRI , additional agents added overnight.     Micro/labs: 10/16 influenza AB PCR negative, SARS-CoV-2 PCR negative 10/16 HIV antibody negative 10/16 urine culture multiple species suggest recollection 10/16 urine drug screen negative, alcohol less than 10 10/17 CSF lymphocytic pleocytosis, fungal culture negative, CSF culture negative 10/17 CSF HSV 1 DNA negative, HSV-2 DNA negative, VZV PCR CSF negative 10/17 CSF cryptococcal antigen negative, cryptococcal antigen titer not indicated 10/17 CSF EBV positive  10/17 CMV qualitative CSF negative 10/17 NMDA IgG on CSF > 1:2560 10/18 HIV RNA quant less than 20 10/20 NMDA IgG serum: positive 1:1000 10/22 RPR nonreactive 10/22 QuantiFERON gold intermediate 10/23 EBV DNA QuantiFERON by PCR positive, less than 35 10/25 hepatitis B  surface antigen nonreactive, hepatitis B core total antibody positive, hepatitis B antibody positive, hepatitis B DNA not detected 10/25 hepatitis C antibody negative 10/26 blood culture no growth to date  Imaging: 10/16 CT head negative 10/16 MRI brain incomplete study due to patient intolerance without evidence of acute intracranial abnormality 10/22 chest abdomen pelvis CT scan no acute intrathoracic abdominal or pelvic pathology, no evidence of malignancy 10/22 MRI brain normal MRI of the brain 10/26 pelvic ultrasound right ovary not visualized, normal appearance of left ovary and uterus, trace free fluid in pelvis 10/27 pelvic MRI: Normal pelvic MRI, symmetric normal ovaries, no adnexal masses 10/31 MRI > no acute infarct or hemorrhage. Normal MRI.   Procedures: 10/28 endotracheal tube >  10/29 R IJ HD catheter >  11/1 RUE PICC >  Interim History / Subjective:  Still spiking fevers Now with irregular heart rhythm and conduction delays question of ST elevation, trops neg, echo being done looks benign.  Objective   Blood pressure (!) 100/59, pulse 74, temperature (!) 101.9 F (38.8 C), temperature source Axillary, resp. rate 20, height 5\' 2"  (1.575 m), weight 56.7 kg, last menstrual period 01/11/2021, SpO2 100 %.    Vent Mode: PRVC FiO2 (%):  [30 %] 30 % Set Rate:  [15 bmp] 15 bmp Vt Set:  [400 mL] 400 mL PEEP:  [5 cmH20] 5 cmH20 Plateau Pressure:  [12 cmH20-18 cmH20] 18 cmH20   Intake/Output Summary (Last 24 hours) at 02/25/2021 0833 Last data filed at 02/25/2021 0800 Gross per 24 hour  Intake 3017.73 ml  Output 1700 ml  Net 1317.73 ml    Filed Weights   02/23/21 1738 02/24/21 0446 02/25/21 0349  Weight: 55 kg 57.3 kg 56.7 kg    Examination: Ill appearing  woman on vent She has no response for me but cranial nerves are intact Ext warm Lungs clear, triggering vent Heart sounds irregular, tachycardic, wide alternating with narrow complex on tele On  precedex   Resolved Hospital Problem list     Assessment & Plan:  Overall plan today: stop precedex, start tramadol, see how/if she wakes up, continue PLEX, rituxan planned    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    She has received IVIG, steroids, no effect. Now on PLEX.   Discussed transfer to [facility] neuro-intensive care unit on 10/29, they recommend initiation of plasmapheresis followed by evaluation of clinical response, consideration of ECT.  MR Pelvis negative for ovarian teratoma. -PLEX initiated 10/30.  Second treatment 11/1.  Third 11/3. Total of 5 sessions planned then rituximab.  -continue keppra, valproic acid, propofol, clonazepam -follow neuro exam  -EEG per Neurology  -appreciate neurology assistance with patient care -add tramadol as some evidence of dyskinesia improvement in conjunction with other therapies; there are case reports of several other add on therapies but will need to be discussion with neurology to try to figure out; apparently clinical response to therapies requires weeks  Acute respiratory failure with hypoxemia due to inability to protect airway -PRVC 8cc/kg  -PEEP /fiO2 for sats >90% -follow CXR -may need trach at some point  Shock, due to sedation -switch sedation to PRN fentanyl  -vasopressors if needed for MAP >65  LLL HCAP - Vanc/cefepime, f/u tracheal aspirate  Hypothyroidism -continue synthroid   Hypokalemia -monitor, replace as indicated   Anemia  -trend CBC  -transfuse for Hgb <7% or active bleeding   Constipation  BM 11/2 -bowel regimen as ordered  Best Practice (right click and "Reselect all SmartList Selections" daily)  Diet/type: tubefeeds DVT prophylaxis: LMWH GI prophylaxis: PPI Lines: N/A Foley:  Yes, and it is still needed Code Status:  full code Last date of multidisciplinary goals of care discussion -full code.      Critical care time: 57 minutes      Erskine Emery MD PCCM

## 2021-02-25 NOTE — Procedures (Addendum)
Patient Name: Jamie [name]  MRN: [id number]  Epilepsy Attending: Lora Havens  Referring Physician/Provider: Dr Donnetta Simpers Duration: 02/24/2021 2138 to 02/25/2021 2138   Patient history:  20 y.o. female with PMH significant for Hashimoto thyroiditis who presents with vomiting, headache and not eating for 4 days and not talking since today. EEG to evaluate for seizure   Level of alertness: lethargic   AEDs during EEG study: LEV, VPA, Clonazepam   Technical aspects: This EEG study was done with scalp electrodes positioned according to the 10-20 International system of electrode placement. Electrical activity was acquired at a sampling rate of 500Hz  and reviewed with a high frequency filter of 70Hz  and a low frequency filter of 1Hz . EEG data were recorded continuously and digitally stored.    Description: EEG showed predominantly 5-6hz  generalized theta slowing was also noted. Intermittent generalized predominantly 1-3Hz  rhythmic delta slowing with overriding 15-18Hz  fronto-central beta activity was also noted. At times patient was also noted to have generalized, maximal bilateral posterior quadrant, periodic discharges with overriding fast activity ( GPD+) at 1 Hz.   Patient event button was pressed on 02/25/2021 at 0338. Per RN patient had right arm and facial twitching which was difficult to visualize on camera. Concomitant eeg didn't show any eeg change to suggest seizure.  Patient event button was pressed on 02/25/2021 at 0526 for left arm twitching. Concomitant eeg didn't show any eeg change to suggest seizure.  Patient event button was pressed on 02/25/2021 at 1657 and 2118 for  eyebrow twitching( raising up and down). Concomitant eeg didn't show any eeg change to suggest seizure.  ABNORMALITY - Periodic discharges with overriding fast activity, generalized ( GPD+) - Delta brush, generalized - Continuous slow, generalized   IMPRESSION: This study showed evidence of  epileptogenicity with generalized onset, maximal bilateral posterior quadrant. 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 Per RN patient had right arm and facial twitching which was difficult to visualize on camera. No concomitant EEG change was seen to suggest seizure.   Patient event button was pressed on 02/25/2021 at 0526 for left arm twitching. Concomitant eeg didn't show any eeg change to suggest seizure.   Patient event button was pressed on 02/25/2021 at 1657 and 2118 for  eyebrow twitching( raising up and down). Concomitant eeg didn't show any eeg change to suggest seizure.  Focal motor seizures may not be seen on scalp eeg. In the setting of NMDA encephalitis, dyskinesias could be another differential    Eulia Hatcher [name]

## 2021-02-26 DIAGNOSIS — G0490 Encephalitis and encephalomyelitis, unspecified: Secondary | ICD-10-CM | POA: Diagnosis not present

## 2021-02-26 LAB — GLUCOSE, CAPILLARY
Glucose-Capillary: 106 mg/dL — ABNORMAL HIGH (ref 70–99)
Glucose-Capillary: 108 mg/dL — ABNORMAL HIGH (ref 70–99)
Glucose-Capillary: 124 mg/dL — ABNORMAL HIGH (ref 70–99)
Glucose-Capillary: 125 mg/dL — ABNORMAL HIGH (ref 70–99)
Glucose-Capillary: 87 mg/dL (ref 70–99)
Glucose-Capillary: 98 mg/dL (ref 70–99)

## 2021-02-26 LAB — MAGNESIUM: Magnesium: 1.8 mg/dL (ref 1.7–2.4)

## 2021-02-26 LAB — CBC
HCT: 25.8 % — ABNORMAL LOW (ref 36.0–46.0)
Hemoglobin: 8.1 g/dL — ABNORMAL LOW (ref 12.0–15.0)
MCH: 30.1 pg (ref 26.0–34.0)
MCHC: 31.4 g/dL (ref 30.0–36.0)
MCV: 95.9 fL (ref 80.0–100.0)
Platelets: 185 10*3/uL (ref 150–400)
RBC: 2.69 MIL/uL — ABNORMAL LOW (ref 3.87–5.11)
RDW: 14.6 % (ref 11.5–15.5)
WBC: 13.5 10*3/uL — ABNORMAL HIGH (ref 4.0–10.5)
nRBC: 0 % (ref 0.0–0.2)

## 2021-02-26 LAB — HEPATIC FUNCTION PANEL
ALT: 10 U/L (ref 0–44)
AST: 14 U/L — ABNORMAL LOW (ref 15–41)
Albumin: 3 g/dL — ABNORMAL LOW (ref 3.5–5.0)
Alkaline Phosphatase: 25 U/L — ABNORMAL LOW (ref 38–126)
Bilirubin, Direct: <0.1 mg/dL (ref 0.0–0.2)
Total Bilirubin: 1.8 mg/dL — ABNORMAL HIGH (ref 0.3–1.2)
Total Protein: 4.5 g/dL — ABNORMAL LOW (ref 6.5–8.1)

## 2021-02-26 LAB — CULTURE, RESPIRATORY W GRAM STAIN

## 2021-02-26 LAB — MISC LABCORP TEST (SEND OUT): Labcorp test code: 9985

## 2021-02-26 LAB — PROCALCITONIN: Procalcitonin: 0.1 ng/mL

## 2021-02-26 LAB — BASIC METABOLIC PANEL
Anion gap: 9 (ref 5–15)
BUN: 7 mg/dL (ref 6–20)
CO2: 21 mmol/L — ABNORMAL LOW (ref 22–32)
Calcium: 7.7 mg/dL — ABNORMAL LOW (ref 8.9–10.3)
Chloride: 106 mmol/L (ref 98–111)
Creatinine, Ser: 0.34 mg/dL — ABNORMAL LOW (ref 0.44–1.00)
GFR, Estimated: >60 mL/min (ref >60)
Glucose, Bld: 85 mg/dL (ref 70–99)
Potassium: 3.5 mmol/L (ref 3.5–5.1)
Sodium: 136 mmol/L (ref 135–145)

## 2021-02-26 LAB — PHOSPHORUS: Phosphorus: 2.3 mg/dL — ABNORMAL LOW (ref 2.5–4.6)

## 2021-02-26 MED ORDER — METOPROLOL TARTRATE 25 MG PO TABS
12.5000 mg | ORAL_TABLET | Freq: Two times a day (BID) | ORAL | Status: DC
  Administered 2021-02-26 – 2021-02-28 (×5): 12.5 mg
  Filled 2021-02-26 (×6): qty 1

## 2021-02-26 MED ORDER — MAGNESIUM SULFATE 2 GM/50ML IV SOLN
2.0000 g | Freq: Once | INTRAVENOUS | Status: AC
  Administered 2021-02-26: 2 g via INTRAVENOUS
  Filled 2021-02-26: qty 50

## 2021-02-26 MED ORDER — PROSOURCE TF PO LIQD
45.0000 mL | Freq: Three times a day (TID) | ORAL | Status: DC
  Administered 2021-02-26 – 2021-03-16 (×55): 45 mL
  Filled 2021-02-26 (×56): qty 45

## 2021-02-26 MED ORDER — POTASSIUM CHLORIDE 20 MEQ PO PACK
40.0000 meq | PACK | Freq: Once | ORAL | Status: AC
  Administered 2021-02-26: 40 meq
  Filled 2021-02-26: qty 2

## 2021-02-26 MED ORDER — VITAL 1.5 CAL PO LIQD
1000.0000 mL | ORAL | Status: DC
  Administered 2021-02-26: 1000 mL

## 2021-02-26 MED ORDER — METOPROLOL TARTRATE 5 MG/5ML IV SOLN
INTRAVENOUS | Status: AC
  Administered 2021-02-26: 5 mg via INTRAVENOUS
  Filled 2021-02-26: qty 5

## 2021-02-26 MED ORDER — METOPROLOL TARTRATE 5 MG/5ML IV SOLN: 5.0000 mg | Freq: Once | INTRAVENOUS | Status: AC

## 2021-02-26 MED ORDER — POTASSIUM CHLORIDE 20 MEQ PO PACK
20.0000 meq | PACK | Freq: Once | ORAL | Status: AC
  Administered 2021-02-26: 20 meq
  Filled 2021-02-26: qty 1

## 2021-02-26 MED ORDER — PROSOURCE TF PO LIQD
45.0000 mL | Freq: Two times a day (BID) | ORAL | Status: DC
  Administered 2021-02-26: 45 mL

## 2021-02-26 MED ORDER — VITAL HIGH PROTEIN PO LIQD: 1000.0000 mL | ORAL | Status: DC

## 2021-02-26 MED ORDER — POTASSIUM PHOSPHATES 15 MMOLE/5ML IV SOLN
15.0000 mmol | Freq: Once | INTRAVENOUS | Status: AC
  Administered 2021-02-26: 15 mmol via INTRAVENOUS
  Filled 2021-02-26: qty 5

## 2021-02-26 NOTE — Progress Notes (Signed)
NAME:  Jamie [name], MRN:  [id number], DOB:  Nov 13, 2000, LOS: 50 ADMISSION DATE:  02/06/2021, CONSULTATION DATE:  10/28 REFERRING MD:  Raymondo Band, CHIEF COMPLAINT:  Seizure, acute metabolic encephalopathy   History of Present Illness:  20 y/o female admitted with seizure and change in mental status.  She was treated with steroids and IVIg but despite this her condition worsened.  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  After treatment her mental status worsened and she developed inability to protect her airway in the setting of tachypnea and tachycardia.  She required intubation on 10/28.   Pertinent  Medical History  Acquired autoimmune hypothyroidism  Significant Hospital Events: Including procedures, antibiotic start and stop dates in addition to other pertinent events   10/16 Admit 10/25 New onset seizure activity, PCCM consult 1026 pulmonary critical care signed off 02/19/2021 pulmonary critical care reconsulted for suspected respiratory distress.  intubated, continuous EEG started 10/30 starting plasmapheresis 10/31 MRI normal, EEG with GPD+ 11/1 RN reports pt seized while in MRI , additional agents added overnight.     Micro/labs: 10/16 influenza AB PCR negative, SARS-CoV-2 PCR negative 10/16 HIV antibody negative 10/16 urine culture multiple species suggest recollection 10/16 urine drug screen negative, alcohol less than 10 10/17 CSF lymphocytic pleocytosis, fungal culture negative, CSF culture negative 10/17 CSF HSV 1 DNA negative, HSV-2 DNA negative, VZV PCR CSF negative 10/17 CSF cryptococcal antigen negative, cryptococcal antigen titer not indicated 10/17 CSF EBV positive  10/17 CMV qualitative CSF negative 10/17 NMDA IgG on CSF > 1:2560 10/18 HIV RNA quant less than 20 10/20 NMDA IgG serum: positive 1:1000 10/22 RPR nonreactive 10/22 QuantiFERON gold intermediate 10/23 EBV DNA QuantiFERON by PCR positive, less than 35 10/25 hepatitis B  surface antigen nonreactive, hepatitis B core total antibody positive, hepatitis B antibody positive, hepatitis B DNA not detected 10/25 hepatitis C antibody negative 10/26 blood culture no growth to date  Imaging: 10/16 CT head negative 10/16 MRI brain incomplete study due to patient intolerance without evidence of acute intracranial abnormality 10/22 chest abdomen pelvis CT scan no acute intrathoracic abdominal or pelvic pathology, no evidence of malignancy 10/22 MRI brain normal MRI of the brain 10/26 pelvic ultrasound right ovary not visualized, normal appearance of left ovary and uterus, trace free fluid in pelvis 10/27 pelvic MRI: Normal pelvic MRI, symmetric normal ovaries, no adnexal masses 10/31 MRI > no acute infarct or hemorrhage. Normal MRI.   Procedures: 10/28 endotracheal tube >  10/29 R IJ HD catheter >  11/1 RUE PICC >  Interim History / Subjective:  Still spiking fevers  Had episode of bradycardia yesterday requiring atropine. Cardiology evaluated with concern for pericarditis, started on low dose colchicine.   She had episode of emesis yesterday.  Mother is at bedside.  Objective   Blood pressure 111/66, pulse 80, temperature 99.1 F (37.3 C), temperature source Oral, resp. rate 17, height 5\' 2"  (1.575 m), weight 58 kg, last menstrual period 01/11/2021, SpO2 100 %.    Vent Mode: PRVC FiO2 (%):  [30 %] 30 % Set Rate:  [15 bmp] 15 bmp Vt Set:  [400 mL] 400 mL PEEP:  [5 cmH20] 5 cmH20 Pressure Support:  [10 cmH20] 10 cmH20 Plateau Pressure:  [9 cmH20-15 cmH20] 13 cmH20   Intake/Output Summary (Last 24 hours) at 02/26/2021 1105 Last data filed at 02/26/2021 0933 Gross per 24 hour  Intake 4188.27 ml  Output 3075 ml  Net 1113.27 ml   Filed Weights   02/25/21 0349 02/25/21  1031 02/26/21 0500  Weight: 56.7 kg 58 kg 58 kg    Examination: General: young woman, ill appearing HENT: [place]/AT, moist mucous membranes, sclera anicteric Lungs: clear to auscultation,  no wheezing or rhonchi Cardiovascular: tachycardic, no murmurs Abdomen: soft, non-distended, bowel sounds present Extremities: warm, no edema Neuro: not following commands, dyskinesias present GU: external catheter present   Resolved Hospital Problem list     Assessment & Plan:   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    She has received IVIG, steroids, no effect. Now on PLEX.   Discussed transfer to [facility] neuro-intensive care unit on 10/29, they recommend initiation of plasmapheresis followed by evaluation of clinical response, consideration of ECT.  MR Pelvis negative for ovarian teratoma. -PLEX initiated 10/30.  Second treatment 11/1.  Third 11/3. Total of 5 sessions planned then rituximab.  -continue keppra, valproic acid, propofol, clonazepam -follow neuro exam  -EEG discontinued per Neurology  -appreciate neurology assistance with patient care -add tramadol as some evidence of dyskinesia improvement in conjunction with other therapies; there are case reports of several other add on therapies but will need to be discussion with neurology to try to figure out; apparently clinical response to therapies requires weeks  Acute respiratory failure with hypoxemia due to inability to protect airway -PRVC 8cc/kg  -PEEP /fiO2 for sats >90% -follow CXR -may need trach at some point  Shock, due to sedation -switch sedation to PRN fentanyl  -vasopressors if needed for MAP >65  LLL HCAP, MSSA - stop vancomycin - continue cefazolin  Hypothyroidism -continue synthroid   Hypokalemia -monitor, replace as indicated   Anemia  -trend CBC  -transfuse for Hgb <7% or active bleeding   Colonic Distension - noted on KUB 11/3 - she is having bowel movements - continue to monitor  Constipation  BM 11/2 -bowel regimen as ordered  Nutrition - resume tube feeds as trickle  Best Practice  (right click and "Reselect all SmartList Selections" daily)  Diet/type: tubefeeds DVT prophylaxis: LMWH GI prophylaxis: PPI Lines: N/A Foley:  Yes, and it is still needed Code Status:  full code Last date of multidisciplinary goals of care discussion -full code.     Critical care time: 75 minutes    Freda Jackson, MD [facility] Office: [phone]   See Amion for personal pager PCCM on call pager [phone] until 7pm. Please call Elink 7p-7a. [phone]

## 2021-02-26 NOTE — Progress Notes (Signed)
Nutrition Follow-up  DOCUMENTATION CODES:   Not applicable  INTERVENTION:   Initiate tube feeding via OG tube: Vital 1.5 at 20 ml/h   Recommend as able advance TF to goal:  Vital 1.5 @ 55 ml/h  (1320 ml per day) Prosource TF 45 ml daily  Provides 2020 kcal, 100 gm protein, 1003 ml free water daily    NUTRITION DIAGNOSIS:   Inadequate oral intake related to acute illness (pt with AMS) as evidenced by meal completion < 25%. Ongoing.   GOAL:   Patient will meet greater than or equal to 90% of their needs Progressing with TF initiation   MONITOR:   Vent status, TF tolerance, Skin, Weight trends, Labs, I & O's  REASON FOR ASSESSMENT:   Consult Enteral/tube feeding initiation and management  ASSESSMENT:   20 y.o. female with a PMHx of Hashimoto thyroiditis who is admitted with acute encephalopathy and catatonia.  Pt discussed during ICU rounds and with RN.  Per RN pt now having BMs. Consult received for TF @ 40 ml/hr via order set however per MD note would like to start trickle (order adjusted).  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  Cardiology following for acute pericarditis   10/16 admitted 10/21 cortrak placed; tip gastric  10/25 seizures; cortrak out 10/26 cortrak placed; tip gastric 10/27 cortrak out  10/28 intubated; OG placed  10/30 PLEX 11/1 seizures; PLEX 11/3 PLEX; pt with emesis episode due to vagal response; KUB with colonic distention measuring up to 6.3 cm possible ileus; OG with tip in stomach   Patient is currently intubated on ventilator support MV: 12.2 L/min Temp (24hrs), Avg:100 F (37.8 C), Min:99.1 F (37.3 C), Max:102.4 F (39.1 C)   Medications reviewed and include: colace, reglan 10 mg every 8 hours (11/3), protonix, miralax, senokot-s, thiamine  Neosynephrine @ 60 ml  Kphos x 1   Labs reviewed: PO4: 2.3 CBG's: 87-106  Diet Order:   Diet Order             Diet NPO time specified  Diet  effective now                   EDUCATION NEEDS:   No education needs have been identified at this time  Skin:  Skin Assessment: Reviewed RN Assessment  Last BM:  11/4 x 2 large and small  Height:   Ht Readings from Last 1 Encounters:  02/19/21 5\' 2"  (1.575 m) (18 %, Z= -0.90)*   * Growth percentiles are based on CDC (Girls, 2-20 Years) data.    Weight:   Wt Readings from Last 1 Encounters:  02/26/21 58 kg (51 %, Z= 0.02)*   * Growth percentiles are based on CDC (Girls, 2-20 Years) data.    Ideal Body Weight:  50 kg  BMI:  Body mass index is 23.39 kg/m.  Estimated Nutritional Needs:   Kcal:  2000  Protein:  90-110 grams  Fluid:  >/= 1.5 L/day  Lockie Pares., RD, LDN, CNSC See AMiON for contact information

## 2021-02-26 NOTE — Progress Notes (Addendum)
LTM maint complete - Skin irritation at Fp1 Fp2, leads adjusted maintenance [facility] monitored, Event button test confirmed by [facility].

## 2021-02-26 NOTE — Progress Notes (Signed)
Neurology Progress Note  Subjective: PLEX 3 of 5 completed 11/3 Overnight EEG without evidence of seizures, LTM discontinued due to avoid skin breakdown, will likely rehook EEG after the weekend  Exam: Vitals:   02/26/21 0805 02/26/21 0900  BP:  111/66  Pulse:  80  Resp:  17  Temp:    SpO2: 100% 100%   Gen: Critically ill appearing female. Intubated, not on sedation  Resp: respirations assisted via mechanical ventilator, spontaneous respirations over set ventilator rate with tachypnea present Abd: soft, non-distended  Neuro: Mental Status: Intubated, not on sedation in the ICU.  She opens her eyes to pain but does not fixate or track.  Does not follow commands.  There are BUE dyskinetic movements on examination that correlate with coughing on the ventilator. Cranial Nerves: PERRL 7 mm/brisk, eyes dysconjugate with VOR intact, face appears symmetric within limitation of evaluation from ETT and securement device, unable to assess hearing, cough and gag reflexes are intact.  Motor: Dyskinetic movements that correlate with coughing on the ventilator of BUE, no movement of BLE. Increased tone in bilateral upper extremities. Bulk is normal. Sensory: Opens eyes to pain with oral and inline suctioning but does not open eyes to noxious stimuli in extremities.  DTR: Areflexic throughout Gait: Deferred  Pertinent Labs: CBC    Component Value Date/Time   WBC 13.5 (H) 02/26/2021 0422   RBC 2.69 (L) 02/26/2021 0422   HGB 8.1 (L) 02/26/2021 0422   HCT 25.8 (L) 02/26/2021 0422   PLT 185 02/26/2021 0422   MCV 95.9 02/26/2021 0422   MCH 30.1 02/26/2021 0422   MCHC 31.4 02/26/2021 0422   RDW 14.6 02/26/2021 0422   LYMPHSABS 0.7 02/21/2021 0700   MONOABS 0.6 02/21/2021 0700   EOSABS 0.1 02/21/2021 0700   BASOSABS 0.0 02/21/2021 0700   CMP     Component Value Date/Time   NA 136 02/26/2021 0422   K 3.5 02/26/2021 0422   CL 106 02/26/2021 0422   CO2 21 (L) 02/26/2021 0422   GLUCOSE  85 02/26/2021 0422   BUN 7 02/26/2021 0422   CREATININE 0.34 (L) 02/26/2021 0422   CALCIUM 7.7 (L) 02/26/2021 0422   PROT 4.5 (L) 02/26/2021 0422   ALBUMIN 3.0 (L) 02/26/2021 0422   ALBUMIN 4.5 02/08/2021 1531   AST 14 (L) 02/26/2021 0422   ALT 10 02/26/2021 0422   ALKPHOS 25 (L) 02/26/2021 0422   BILITOT 1.8 (H) 02/26/2021 0422   GFRNONAA >60 02/26/2021 0422   10/17 CSF EBV positive  10/17 NMDA IgG on CSF > 1:2560 10/20 NMDA IgG serum: positive 1:1000  Imaging Reviewed:  MRI brain 10/31: Normal brain MRI.  cEEG 02/25/2021 - 02/26/2021: "This study is suggestive of moderate to severe diffuse encephalopathy, nonspecific etiology but likely secondary to NMDA encephalitis as well as sedation. No definite seizures or epileptiform discharges were seen during this study."  Assessment: 20 y.o. 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 intake for 4 days and decreased verbalization 10/15 prior to arrival. Initial concerns for Hashimoto's encephalitis but extensive workup revealed CSF pleocytosis (273, mononuclear), positive NMDA receptor IgG, elevated IgG index and 3 oligoclonal bands in the CSF that are not noted in the serum. R frontal seizure on cEEG on 10/25, has not had any since she has been on Keppra. Her presentation is c/w NMDA receptor encephalitis. Viral encephalitis felt unlikely given no fever, viral PCR studies negative and NMDAR encephalitis felt  more likely. She completed 5 days of high dose IV Solumedrol and completed IVIG x 5 doses. She completed 3rd session of planned 5 PLEX yesterday.  Has had concerns for ST elevation on EKG-cardiology following with concern for acute pericarditis   Impression:  NMDA encephalitis Acute encephalopathy, due to NMDA encephalitis and sedation and seizures New onset seizures Acute respiratory failure Dyskinesias  Recommendations: - Discontinue LTM with patient  seizure free for 24 hours to prevent skin breakdown - consider rehooking patient to EEG Monday given poor exam is difficult to follow clinically - Continue clonazepam 1 mg BID for dyskinesias - Continue Depakote 500 mg q8h and Keppra 1,500 mg BID - Continue seizure precautions - PLEX round 3 completed 11/3, plan for 5 sessions total  Neurology Attending Attestation   I examined the patient and discussed plan with Ms. Toberman. Above note has been edited by me to reflect my findings and recommendations.    This patient is critically ill and at significant risk of neurological worsening, death and care requires constant monitoring of vital signs, hemodynamics,respiratory and cardiac monitoring, neurological assessment, discussion with family, other specialists and medical decision making of high complexity. I spent 40 minutes of neurocritical care time  in the care of  this patient. This was time spent independent of any time provided by nurse practitioner or PA.   Su Monks, MD [facility] [phone]   If 7pm- 7am, please page neurology on call as listed in [place].

## 2021-02-26 NOTE — Progress Notes (Signed)
Sinus tachycardia, HR 120's with mild fever. Responded well to IV metoprolol 5 mg.  Will give small dose bid/tube. Hold for heart rate less than 60/min.  Dixie Dials, MD 02/26/2021, 7:58 PM.

## 2021-02-26 NOTE — Consult Note (Signed)
Ref: Ok Edwards, MD   Subjective:  Minimal responsiveness when head was cleaned by nurse. Monitor Sinus rhythm without ST elevations or wide complex tachycardia.  Objective:  Vital Signs in the last 24 hours: Temp:  [98.8 F (37.1 C)-99.7 F (37.6 C)] 98.8 F (37.1 C) (11/04 1600) Pulse Rate:  [59-114] 90 (11/04 1700) Cardiac Rhythm: Sinus tachycardia;Normal sinus rhythm (11/04 0800) Resp:  [15-30] 16 (11/04 1700) BP: (90-132)/(51-94) 109/59 (11/04 1700) SpO2:  [91 %-100 %] 99 % (11/04 1700) FiO2 (%):  [30 %] 30 % (11/04 1600) Weight:  [58 kg] 58 kg (11/04 0500)  Physical Exam: BP Readings from Last 1 Encounters:  02/26/21 (!) 109/59     Wt Readings from Last 1 Encounters:  02/26/21 58 kg (51 %, Z= 0.02)*   * Growth percentiles are based on CDC (Girls, 2-20 Years) data.    Weight change: 1.3 kg Body mass index is 23.39 kg/m. HEENT: [place]/AT, Eyes-Brown, Conjunctiva-Pink, Sclera-Non-icteric Neck: No JVD, No bruit, Trachea midline. Lungs:  Clear, Bilateral. Cardiac:  Regular rhythm, normal S1 and S2, no S3. II/VI systolic murmur. Abdomen:  Soft, non-tender. BS present. Extremities:  No edema present. No cyanosis. No clubbing. CNS: AxOx0.  Skin: Warm and dry.   Intake/Output from previous day: 11/03 0701 - 11/04 0700 In: 4083.8 [I.V.:2910.2; NG/GT:375; IV Piggyback:798.6] Out: 2750 [Urine:2250; Emesis/NG output:500]    Lab Results: BMET    Component Value Date/Time   NA 136 02/26/2021 0422   NA 145 02/25/2021 1001   NA 145 02/25/2021 0412   K 3.5 02/26/2021 0422   K 4.1 02/25/2021 1001   K 3.3 (L) 02/25/2021 0412   CL 106 02/26/2021 0422   CL 116 (H) 02/25/2021 1001   CL 114 (H) 02/25/2021 0412   CO2 21 (L) 02/26/2021 0422   CO2 22 02/25/2021 1001   CO2 24 02/25/2021 0412   GLUCOSE 85 02/26/2021 0422   GLUCOSE 169 (H) 02/25/2021 1001   GLUCOSE 171 (H) 02/25/2021 0412   BUN 7 02/26/2021 0422   BUN 10 02/25/2021 1001   BUN 11 02/25/2021 0412    CREATININE 0.34 (L) 02/26/2021 0422   CREATININE 0.43 (L) 02/25/2021 1001   CREATININE 0.56 02/25/2021 0412   CALCIUM 7.7 (L) 02/26/2021 0422   CALCIUM 8.4 (L) 02/25/2021 1001   CALCIUM 8.2 (L) 02/25/2021 0412   GFRNONAA >60 02/26/2021 0422   GFRNONAA >60 02/25/2021 1001   GFRNONAA >60 02/25/2021 0412   CBC    Component Value Date/Time   WBC 13.5 (H) 02/26/2021 0422   RBC 2.69 (L) 02/26/2021 0422   HGB 8.1 (L) 02/26/2021 0422   HCT 25.8 (L) 02/26/2021 0422   PLT 185 02/26/2021 0422   MCV 95.9 02/26/2021 0422   MCH 30.1 02/26/2021 0422   MCHC 31.4 02/26/2021 0422   RDW 14.6 02/26/2021 0422   LYMPHSABS 0.7 02/21/2021 0700   MONOABS 0.6 02/21/2021 0700   EOSABS 0.1 02/21/2021 0700   BASOSABS 0.0 02/21/2021 0700   HEPATIC Function Panel Recent Labs    02/20/21 0636 02/21/21 0700 02/26/21 0422  PROT 8.9* RESULTS UNAVAILABLE DUE TO INTERFERING SUBSTANCE 4.5*   HEMOGLOBIN A1C No components found for: HGA1C,  MPG CARDIAC ENZYMES Lab Results  Component Value Date   CKTOTAL 169 02/16/2021   BNP No results for input(s): PROBNP in the last 8760 hours. TSH Recent Labs    05/08/20 1625 07/07/20 1449 02/06/21 1747  TSH 18.75* 2.56 10.095*   CHOLESTEROL Recent Labs    02/09/21  0344  CHOL 108    Scheduled Meds:  chlorhexidine gluconate (MEDLINE KIT)  15 mL Mouth Rinse BID   Chlorhexidine Gluconate Cloth  6 each Topical Daily   clonazePAM  1 mg Per Tube BID   colchicine  0.6 mg Per Tube Daily   docusate  100 mg Per Tube BID   enoxaparin (LOVENOX) injection  40 mg Subcutaneous Q24H   feeding supplement (PROSource TF)  45 mL Per Tube TID   levothyroxine  100 mcg Per Tube Q0600   mouth rinse  15 mL Mouth Rinse 10 times per day   metoCLOPramide (REGLAN) injection  10 mg Intravenous Q8H   pantoprazole sodium  40 mg Per Tube QHS   polyethylene glycol  17 g Per Tube BID   scopolamine  1 patch Transdermal Q72H   senna-docusate  2 tablet Per NG tube BID   sodium  chloride flush  10-40 mL Intracatheter Q12H   tenofovir  300 mg Per Tube Daily   thiamine  100 mg Per Tube Daily   traMADol  50 mg Per Tube Q6H   valproic acid  500 mg Per Tube Q8H   Continuous Infusions:  sodium chloride Stopped (02/23/21 1323)   sodium chloride 100 mL/hr at 02/26/21 1600    ceFAZolin (ANCEF) IV Stopped (02/26/21 1453)   citrate dextrose 1,000 mL (02/23/21 1647)   feeding supplement (VITAL 1.5 CAL) 1,000 mL (02/26/21 1139)   levETIRAcetam Stopped (02/26/21 0959)   phenylephrine (NEO-SYNEPHRINE) Adult infusion Stopped (02/26/21 1451)   potassium PHOSPHATE IVPB (in mmol) 15 mmol (02/26/21 1642)   PRN Meds:.[DISCONTINUED] acetaminophen (TYLENOL) oral liquid 160 mg/5 mL **OR** acetaminophen, acetaminophen, atropine, diphenhydrAMINE, fentaNYL (SUBLIMAZE) injection, heparin, sodium chloride flush  Assessment/Plan:  Acute pericarditis with small pericardial effusion Acute NMDAR encephalitis Acute respiratory failure with hypoxia Seizure disorder  Plan: Continue colchicine. Re-consult as needed. Repeat echocardiogram in few weeks or as needed.   LOS: 19 days   Time spent including chart review, lab review, examination, discussion with patient/Nurse/Mom : 30 min   Dixie Dials  MD  02/26/2021, 7:11 PM

## 2021-02-26 NOTE — Progress Notes (Signed)
LTM EEG discontinued - no skin breakdown at unhook.   

## 2021-02-26 NOTE — Procedures (Addendum)
Patient Name: Bani Gianfrancesco  MRN: [id number]  Epilepsy Attending: Lora Havens  Referring Physician/Provider: Dr Donnetta Simpers Duration: 02/25/2021 2138 to 02/26/2021 1009   Patient history:  20 y.o. female with PMH significant for Hashimoto thyroiditis who presents with vomiting, headache and not eating for 4 days and not talking since today. EEG to evaluate for seizure   Level of alertness: lethargic   AEDs during EEG study: LEV, VPA, Clonazepam   Technical aspects: This EEG study was done with scalp electrodes positioned according to the 10-20 International system of electrode placement. Electrical activity was acquired at a sampling rate of 500Hz  and reviewed with a high frequency filter of 70Hz  and a low frequency filter of 1Hz . EEG data were recorded continuously and digitally stored.    Description: EEG showed predominantly 3-6hz  generalized polymorphic theta-delta slowing was also noted. Intermittent generalized 1-3Hz  rhythmic delta slowing with overriding 15-18Hz  beta activity was also noted consistent with delta brush.    ABNORMALITY - Delta brush, generalized - Continuous slow, generalized   IMPRESSION: This study is suggestive of moderate to severe diffuse encephalopathy, nonspecific etiology but likely secondary to NMDA encephalitis as well as sedation. No definite seizures or epileptiform discharges were seen during this study.     Sherlock Nancarrow [name]

## 2021-02-27 DIAGNOSIS — G0490 Encephalitis and encephalomyelitis, unspecified: Secondary | ICD-10-CM | POA: Diagnosis not present

## 2021-02-27 LAB — CBC
HCT: 22 % — ABNORMAL LOW (ref 36.0–46.0)
Hemoglobin: 7 g/dL — ABNORMAL LOW (ref 12.0–15.0)
MCH: 30.4 pg (ref 26.0–34.0)
MCHC: 31.8 g/dL (ref 30.0–36.0)
MCV: 95.7 fL (ref 80.0–100.0)
Platelets: 150 10*3/uL (ref 150–400)
RBC: 2.3 MIL/uL — ABNORMAL LOW (ref 3.87–5.11)
RDW: 14.4 % (ref 11.5–15.5)
WBC: 7.6 10*3/uL (ref 4.0–10.5)
nRBC: 0 % (ref 0.0–0.2)

## 2021-02-27 LAB — HEPATIC FUNCTION PANEL
ALT: 12 U/L (ref 0–44)
AST: 16 U/L (ref 15–41)
Albumin: 2.7 g/dL — ABNORMAL LOW (ref 3.5–5.0)
Alkaline Phosphatase: 24 U/L — ABNORMAL LOW (ref 38–126)
Bilirubin, Direct: <0.1 mg/dL (ref 0.0–0.2)
Total Bilirubin: 0.5 mg/dL (ref 0.3–1.2)
Total Protein: 4.7 g/dL — ABNORMAL LOW (ref 6.5–8.1)

## 2021-02-27 LAB — BASIC METABOLIC PANEL
Anion gap: 7 (ref 5–15)
Anion gap: 7 (ref 5–15)
Anion gap: 4 — ABNORMAL LOW (ref 5–15)
BUN: 11 mg/dL (ref 6–20)
BUN: 8 mg/dL (ref 6–20)
BUN: 9 mg/dL (ref 6–20)
CO2: 25 mmol/L (ref 22–32)
CO2: 21 mmol/L — ABNORMAL LOW (ref 22–32)
CO2: 25 mmol/L (ref 22–32)
Calcium: 7.5 mg/dL — ABNORMAL LOW (ref 8.9–10.3)
Calcium: 7.2 mg/dL — ABNORMAL LOW (ref 8.9–10.3)
Calcium: 8.3 mg/dL — ABNORMAL LOW (ref 8.9–10.3)
Chloride: 109 mmol/L (ref 98–111)
Chloride: 112 mmol/L — ABNORMAL HIGH (ref 98–111)
Chloride: 114 mmol/L — ABNORMAL HIGH (ref 98–111)
Creatinine, Ser: 0.36 mg/dL — ABNORMAL LOW (ref 0.44–1.00)
Creatinine, Ser: 0.32 mg/dL — ABNORMAL LOW (ref 0.44–1.00)
Creatinine, Ser: <0.3 mg/dL — ABNORMAL LOW (ref 0.44–1.00)
GFR, Estimated: >60 mL/min (ref >60)
GFR, Estimated: >60 mL/min (ref >60)
Glucose, Bld: 104 mg/dL — ABNORMAL HIGH (ref 70–99)
Glucose, Bld: 233 mg/dL — ABNORMAL HIGH (ref 70–99)
Glucose, Bld: 113 mg/dL — ABNORMAL HIGH (ref 70–99)
Potassium: 2.8 mmol/L — ABNORMAL LOW (ref 3.5–5.1)
Potassium: 5.6 mmol/L — ABNORMAL HIGH (ref 3.5–5.1)
Potassium: 4.4 mmol/L (ref 3.5–5.1)
Sodium: 141 mmol/L (ref 135–145)
Sodium: 140 mmol/L (ref 135–145)
Sodium: 143 mmol/L (ref 135–145)

## 2021-02-27 LAB — GLUCOSE, CAPILLARY
Glucose-Capillary: 102 mg/dL — ABNORMAL HIGH (ref 70–99)
Glucose-Capillary: 110 mg/dL — ABNORMAL HIGH (ref 70–99)
Glucose-Capillary: 113 mg/dL — ABNORMAL HIGH (ref 70–99)
Glucose-Capillary: 113 mg/dL — ABNORMAL HIGH (ref 70–99)
Glucose-Capillary: 119 mg/dL — ABNORMAL HIGH (ref 70–99)
Glucose-Capillary: 123 mg/dL — ABNORMAL HIGH (ref 70–99)

## 2021-02-27 LAB — PHOSPHORUS
Phosphorus: 2.3 mg/dL — ABNORMAL LOW (ref 2.5–4.6)
Phosphorus: 2.9 mg/dL (ref 2.5–4.6)

## 2021-02-27 LAB — ABO/RH: ABO/RH(D): A POS

## 2021-02-27 LAB — MAGNESIUM: Magnesium: 1.9 mg/dL (ref 1.7–2.4)

## 2021-02-27 MED ORDER — POTASSIUM CHLORIDE 20 MEQ PO PACK
40.0000 meq | PACK | Freq: Once | ORAL | Status: AC
  Administered 2021-02-27: 40 meq
  Filled 2021-02-27: qty 2

## 2021-02-27 MED ORDER — POTASSIUM CHLORIDE 10 MEQ/50ML IV SOLN
10.0000 meq | INTRAVENOUS | Status: AC
  Administered 2021-02-27 (×2): 10 meq via INTRAVENOUS
  Filled 2021-02-27 (×2): qty 50

## 2021-02-27 MED ORDER — CALCIUM GLUCONATE-NACL 2-0.675 GM/100ML-% IV SOLN
INTRAVENOUS | Status: AC
  Administered 2021-02-27: 2000 mg via INTRAVENOUS
  Filled 2021-02-27: qty 100

## 2021-02-27 MED ORDER — DIPHENHYDRAMINE HCL 25 MG PO CAPS: 25.0000 mg | ORAL_CAPSULE | Freq: Four times a day (QID) | ORAL | Status: DC | PRN

## 2021-02-27 MED ORDER — FAMOTIDINE IN NACL 20-0.9 MG/50ML-% IV SOLN
20.0000 mg | Freq: Once | INTRAVENOUS | Status: DC | PRN
  Filled 2021-02-27: qty 50

## 2021-02-27 MED ORDER — CALCIUM GLUCONATE-NACL 2-0.675 GM/100ML-% IV SOLN
2.0000 g | Freq: Once | INTRAVENOUS | Status: AC
  Filled 2021-02-27: qty 100

## 2021-02-27 MED ORDER — DIPHENHYDRAMINE HCL 50 MG/ML IJ SOLN: 50.0000 mg | Freq: Once | INTRAMUSCULAR | Status: DC | PRN

## 2021-02-27 MED ORDER — VITAL 1.5 CAL PO LIQD
1000.0000 mL | ORAL | Status: DC
  Administered 2021-02-27: 1000 mL

## 2021-02-27 MED ORDER — SODIUM CHLORIDE 0.9 % IV SOLN: 1000.0000 mg | Freq: Once | INTRAVENOUS | Status: DC

## 2021-02-27 MED ORDER — MAGNESIUM SULFATE 2 GM/50ML IV SOLN
2.0000 g | Freq: Once | INTRAVENOUS | Status: AC
  Administered 2021-02-27: 2 g via INTRAVENOUS
  Filled 2021-02-27: qty 50

## 2021-02-27 MED ORDER — HEPARIN SODIUM (PORCINE) 1000 UNIT/ML IJ SOLN
INTRAMUSCULAR | Status: AC
  Administered 2021-02-27: 1000 [IU]
  Filled 2021-02-27: qty 2

## 2021-02-27 MED ORDER — SODIUM CHLORIDE 0.9 % IV BOLUS: 1000.0000 mL | Freq: Once | INTRAVENOUS | Status: DC | PRN

## 2021-02-27 MED ORDER — ACD FORMULA A 0.73-2.45-2.2 GM/100ML VI SOLN
Status: AC
  Administered 2021-02-27: 1000 mL
  Filled 2021-02-27: qty 1000

## 2021-02-27 MED ORDER — ACETAMINOPHEN 325 MG PO TABS
650.0000 mg | ORAL_TABLET | ORAL | Status: DC | PRN
  Administered 2021-03-01: 650 mg
  Filled 2021-02-27: qty 2

## 2021-02-27 MED ORDER — METHYLPREDNISOLONE SODIUM SUCC 125 MG IJ SOLR: 125.0000 mg | Freq: Once | INTRAMUSCULAR | Status: DC | PRN

## 2021-02-27 MED ORDER — SODIUM CHLORIDE 0.9 % IV SOLN
INTRAVENOUS | Status: AC
  Filled 2021-02-27 (×3): qty 200

## 2021-02-27 MED ORDER — DIPHENHYDRAMINE HCL 25 MG PO CAPS: 50.0000 mg | ORAL_CAPSULE | Freq: Once | ORAL | Status: DC

## 2021-02-27 MED ORDER — ALBUTEROL SULFATE (2.5 MG/3ML) 0.083% IN NEBU: 2.5000 mg | INHALATION_SOLUTION | Freq: Once | RESPIRATORY_TRACT | Status: DC | PRN

## 2021-02-27 MED ORDER — HEPARIN SODIUM (PORCINE) 1000 UNIT/ML IJ SOLN: 1000.0000 [IU] | Freq: Once | INTRAMUSCULAR | Status: AC

## 2021-02-27 MED ORDER — POTASSIUM PHOSPHATES 15 MMOLE/5ML IV SOLN
15.0000 mmol | Freq: Once | INTRAVENOUS | Status: AC
  Administered 2021-02-27: 15 mmol via INTRAVENOUS
  Filled 2021-02-27: qty 5

## 2021-02-27 MED ORDER — ACD FORMULA A 0.73-2.45-2.2 GM/100ML VI SOLN
1000.0000 mL | Status: DC
  Filled 2021-02-27 (×2): qty 1000

## 2021-02-27 MED ORDER — EPINEPHRINE PF 1 MG/ML IJ SOLN: 0.3000 mg | INTRAMUSCULAR | Status: DC | PRN

## 2021-02-27 NOTE — Progress Notes (Signed)
Neurology Progress Note  Subjective: PLEX 4/5 to be complete today LTM discontinued due to avoid skin breakdown yesterday, will likely rehook EEG after the weekend No further dyskinetic movements noted on examination today, per RN, patient did have some dyskinetic movements while being cleaned up from a large bowel movement without dyskinesia at rest  Exam: Vitals:   02/27/21 0749 02/27/21 0800  BP: 132/67   Pulse:    Resp:    Temp:  98.3 F (36.8 C)  SpO2: 100%    Gen: Critically ill appearing female. Intubated, not on sedation  Resp: respirations assisted via mechanical ventilator, on pressure support with spontaneous respirations, mild tachypnea Abd: soft, non-distended  Neuro: Mental Status: Intubated, not on sedation in the ICU.  Her eyes are partially open at rest without further opening with stimulus She does not follow commands, does not respond to pain. There are no further noted dyskinetic movements during examination this morning.  Cranial Nerves: PERRL 5 mm/brisk, eyes dysconjugate with VOR intact, face appears symmetric within limitation of evaluation from ETT and securement device, unable to assess hearing.   Motor: There is no response to noxious stimuli in bilateral upper or lower extremities.  Patient is flaccid throughout. Bulk is normal. Sensory: No response to noxious stimuli throughout.  DTR: Areflexic in BLE, 2+ and symmetric biceps and brachioradialis.  Gait: Deferred for patient safety  Pertinent Labs: CBC    Component Value Date/Time   WBC 7.6 02/27/2021 0325   RBC 2.30 (L) 02/27/2021 0325   HGB 7.0 (L) 02/27/2021 0325   HCT 22.0 (L) 02/27/2021 0325   PLT 150 02/27/2021 0325   MCV 95.7 02/27/2021 0325   MCH 30.4 02/27/2021 0325   MCHC 31.8 02/27/2021 0325   RDW 14.4 02/27/2021 0325   LYMPHSABS 0.7 02/21/2021 0700   MONOABS 0.6 02/21/2021 0700   EOSABS 0.1 02/21/2021 0700   BASOSABS 0.0 02/21/2021 0700   CMP     Component Value Date/Time    NA 141 02/27/2021 0325   K 2.8 (L) 02/27/2021 0325   CL 109 02/27/2021 0325   CO2 25 02/27/2021 0325   GLUCOSE 104 (H) 02/27/2021 0325   BUN 11 02/27/2021 0325   CREATININE 0.36 (L) 02/27/2021 0325   CALCIUM 7.5 (L) 02/27/2021 0325   PROT 4.7 (L) 02/27/2021 0325   ALBUMIN 2.7 (L) 02/27/2021 0325   ALBUMIN 4.5 02/08/2021 1531   AST 16 02/27/2021 0325   ALT 12 02/27/2021 0325   ALKPHOS 24 (L) 02/27/2021 0325   BILITOT 0.5 02/27/2021 0325   GFRNONAA >60 02/27/2021 0325   10/17 CSF EBV positive  10/17 NMDA IgG on CSF > 1:2560 10/20 NMDA IgG serum: positive 1:1000  Imaging Reviewed:  MRI brain 10/31: Normal brain MRI.  cEEG 02/25/2021 - 02/26/2021: "This study is suggestive of moderate to severe diffuse encephalopathy, nonspecific etiology but likely secondary to NMDA encephalitis as well as sedation. No definite seizures or epileptiform discharges were seen during this study."  Assessment: 20 y.o. 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 intake for 4 days and decreased verbalization 10/15 prior to arrival. Initial concerns for Hashimoto's encephalitis but extensive workup revealed CSF pleocytosis (273, mononuclear), positive NMDA receptor IgG, elevated IgG index and 3 oligoclonal bands in the CSF that are not noted in the serum. R frontal seizure on cEEG on 10/25, has not had any since she has been on Keppra. 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 She completed 5 days of high dose IV Solumedrol and completed IVIG x 5 doses. She completed 3rd session of planned 5 PLEX yesterday.  Has had concerns for ST elevation on EKG-cardiology following with concern for acute pericarditis   Impression:  NMDA encephalitis Acute encephalopathy, due to NMDA encephalitis and sedation and  seizures New onset seizures Acute respiratory failure Dyskinesias  Recommendations: - Discontinue LTM with patient seizure free for 24 hours to prevent skin breakdown - consider rehooking patient to EEG Monday given poor exam is difficult to follow clinically - Continue clonazepam 1 mg BID for dyskinesias.  Also added tramadol based on literature search by PCCM for improvement of dyskinesias. - Continue Depakote 500 mg q8h and Keppra 1,500 mg BID - Continue seizure precautions - PLEX round 4 today, plan for 5 sessions total -Rituxan after PLEX  Anibal Henderson, AGACNP-BC [facility] [phone]    Attending addendum Day #4 of plasma exchange today On examination, and disconjugate gaze, reactive and round pupils, sluggish corneal reflexes bilaterally, does not blink to threat on both sides, noxious simulation noted to have localization with the upper extremities.  No response to Dr. Stimulation in lower extremities ST elevation/pericarditis being managed by cardiology. LTM was discontinued yesterday as the patient was seizure-free for upwards of 24 hours to prevent skin breakdown. Consider reattaching to LTM if examination does not improve on Monday. Detailed discussion with mother-explained that she still remains very critical and we will continue to watch closely.  Detailed plan above that I agree with and helped formulate   -- Amie Portland, MD Neurologist [facility] Pager: [phone]  CRITICAL CARE ATTESTATION Performed by: Amie Portland, MD Total critical care time: 35 minutes Critical care time was exclusive of separately billable procedures and treating other patients and/or supervising APPs/Residents/Students Critical care was necessary to treat or prevent imminent or life-threatening deterioration due to NMDA encephalitis This patient is critically ill and at significant risk for neurological worsening and/or death and care requires constant  monitoring. Critical care was time spent personally by me on the following activities: development of treatment plan with patient and/or surrogate as well as nursing, discussions with consultants, evaluation of patient's response to treatment, examination of patient, obtaining history from patient or surrogate, ordering and performing treatments and interventions, ordering and review of laboratory studies, ordering and review of radiographic studies, pulse oximetry, re-evaluation of patient's condition, participation in multidisciplinary rounds and medical decision making of high complexity in the care of this patient.

## 2021-02-27 NOTE — Progress Notes (Signed)
IV Team received consult for Rituxan; have contacted Marianna Payment RN, Asst Director of IV Team .

## 2021-02-27 NOTE — Progress Notes (Signed)
E-link contacted over critical lab results,  K. @ 2.8 Hgb @ 7.0  Orders received for K+ replacement thru IV central line and per tube. Pt's VS stable, will contact if anything changes.   Royal Piedra, RN

## 2021-02-27 NOTE — Progress Notes (Signed)
[facility] ADULT ICU REPLACEMENT PROTOCOL   The patient does apply for the [facility] Adult ICU Electrolyte Replacment Protocol based on the criteria listed below:   1.Exclusion criteria: TCTS patients, ECMO patients, and Dialysis patients 2. Is GFR >/= 30 ml/min? Yes.    Patient's GFR today is >60 3. Is SCr </= 2? Yes.   Patient's SCr is 0.32 mg/dL 4. Did SCr increase >/= 0.5 in 24 hours? No. 5.Pt's weight >40kg  Yes.   6. Abnormal electrolyte(s): mag 1.9, phos 2.3, potassium 2.8  7. Electrolytes replaced per protocol 8.  Call MD STAT for K+ </= 2.5, Phos </= 1, or Mag </= 1 Physician:  n/a  Darlys Gales 02/27/2021 4:41 AM

## 2021-02-27 NOTE — Progress Notes (Signed)
NAME:  Jamie [name], MRN:  [id number], DOB:  06-05-2000, LOS: 58 ADMISSION DATE:  02/06/2021, CONSULTATION DATE:  10/28 REFERRING MD:  Raymondo Band, CHIEF COMPLAINT:  Seizure, acute metabolic encephalopathy   History of Present Illness:  20 y/o female admitted with seizure and change in mental status.  She was treated with steroids and IVIg but despite this her condition worsened.  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  After treatment her mental status worsened and she developed inability to protect her airway in the setting of tachypnea and tachycardia.  She required intubation on 10/28.   Pertinent  Medical History  Acquired autoimmune hypothyroidism  Significant Hospital Events: Including procedures, antibiotic start and stop dates in addition to other pertinent events   10/16 Admit 10/25 New onset seizure activity, PCCM consult 1026 pulmonary critical care signed off 02/19/2021 pulmonary critical care reconsulted for suspected respiratory distress.  intubated, continuous EEG started 10/30 starting plasmapheresis 10/31 MRI normal, EEG with GPD+ 11/1 RN reports pt seized while in MRI , additional agents added overnight.     Micro/labs: 10/16 influenza AB PCR negative, SARS-CoV-2 PCR negative 10/16 HIV antibody negative 10/16 urine culture multiple species suggest recollection 10/16 urine drug screen negative, alcohol less than 10 10/17 CSF lymphocytic pleocytosis, fungal culture negative, CSF culture negative 10/17 CSF HSV 1 DNA negative, HSV-2 DNA negative, VZV PCR CSF negative 10/17 CSF cryptococcal antigen negative, cryptococcal antigen titer not indicated 10/17 CSF EBV positive  10/17 CMV qualitative CSF negative 10/17 NMDA IgG on CSF > 1:2560 10/18 HIV RNA quant less than 20 10/20 NMDA IgG serum: positive 1:1000 10/22 RPR nonreactive 10/22 QuantiFERON gold intermediate 10/23 EBV DNA QuantiFERON by PCR positive, less than 35 10/25 hepatitis B  surface antigen nonreactive, hepatitis B core total antibody positive, hepatitis B antibody positive, hepatitis B DNA not detected 10/25 hepatitis C antibody negative 10/26 blood culture no growth to date  Imaging: 10/16 CT head negative 10/16 MRI brain incomplete study due to patient intolerance without evidence of acute intracranial abnormality 10/22 chest abdomen pelvis CT scan no acute intrathoracic abdominal or pelvic pathology, no evidence of malignancy 10/22 MRI brain normal MRI of the brain 10/26 pelvic ultrasound right ovary not visualized, normal appearance of left ovary and uterus, trace free fluid in pelvis 10/27 pelvic MRI: Normal pelvic MRI, symmetric normal ovaries, no adnexal masses 10/31 MRI > no acute infarct or hemorrhage. Normal MRI.   Procedures: 10/28 endotracheal tube >  10/29 R IJ HD catheter >  11/1 RUE PICC >  Interim History / Subjective:  Afebrile over past 24 hours. No acute events overnight.  PLEX scheduled for today, 4th treatment.  Cardiology added low dose metoprolol yesterday  Mother is at bedside.  Objective   Blood pressure (!) 109/59, pulse 94, temperature 99.4 F (37.4 C), temperature source Oral, resp. rate 15, height 5\' 2"  (1.575 m), weight 58 kg, last menstrual period 01/11/2021, SpO2 100 %.    Vent Mode: PRVC FiO2 (%):  [30 %] 30 % Set Rate:  [15 bmp] 15 bmp Vt Set:  [400 mL] 400 mL PEEP:  [5 cmH20] 5 cmH20 Pressure Support:  [10 cmH20] 10 cmH20 Plateau Pressure:  [9 cmH20-14 cmH20] 14 cmH20   Intake/Output Summary (Last 24 hours) at 02/27/2021 0738 Last data filed at 02/27/2021 0600 Gross per 24 hour  Intake 3362.93 ml  Output 3075 ml  Net 287.93 ml   Filed Weights   02/25/21 0349 02/25/21 1031 02/26/21 0500  Weight: 56.7  kg 58 kg 58 kg   Examination: General: young woman, ill appearing HENT: [place]/AT, moist mucous membranes, sclera anicteric Lungs: clear to auscultation, no wheezing or rhonchi Cardiovascular: rrr, no  murmurs Abdomen: soft, non-distended, bowel sounds present Extremities: warm, no edema Neuro: not following commands, corneal reflex intact, PERRL GU: external catheter present  Resolved Hospital Problem list     Assessment & Plan:  NMDA Receptor Encephalitis - received IVIG and steroids with no clinical improvement - Discussed transfer to [facility] neuro-intensive care unit on 10/29, they recommend initiation of plasmapheresis followed by evaluation of clinical response, consideration of ECT.  MR Pelvis negative for ovarian teratoma. - PLEX started 10/30 with serial treatments on 11/1, 11/3 and 4th session scheduled today 11/5 and a final treatment on 11/7. - Plan for rituxan infusion late afternoon on 11/7 after last PLEX treatment.  - Appreciate Neurology input - Continue clonazepam 1mg  BID for dyskinesis - Continue depakote 500mg  q8h BID and keppra 1500 BID -tramadol added as some evidence of dyskinesia improvement in conjunction with other therapies; there are case reports of several other add on therapies but will need to be discussion with neurology to try to figure out; apparently clinical response to therapies requires weeks  Acute respiratory failure with hypoxemia due to inability to protect airway -PRVC 8cc/kg  -PEEP /fiO2 for sats >90% -follow CXR -Will likely need tracheostomy this coming week. I discussed this possibility with the mother 11/5  LLL HCAP, MSSA - continue cefazolin  Hypothyroidism -continue synthroid   Hypokalemia Hypophosphatemia -monitor, replace as indicated   Anemia  -trend CBC  -transfuse for Hgb <7% or active bleeding   Colonic Distension - noted on KUB 11/3 - she is having bowel movements - continue to monitor  Constipation  BM 11/2 -bowel regimen as ordered  Nutrition - increase tubefeeds to 30/hr today and then goal tomorrow if no issues  Best Practice (right click and "Reselect all SmartList Selections" daily)  Diet/type:  tubefeeds DVT prophylaxis: LMWH GI prophylaxis: PPI Lines: N/A Foley:  Yes, and it is still needed Code Status:  full code Last date of multidisciplinary goals of care discussion -full code.     Critical care time: 40 minutes    Freda Jackson, MD [facility] Office: [phone]   See Amion for personal pager PCCM on call pager [phone] until 7pm. Please call Elink 7p-7a. [phone]

## 2021-02-28 DIAGNOSIS — G0490 Encephalitis and encephalomyelitis, unspecified: Secondary | ICD-10-CM | POA: Diagnosis not present

## 2021-02-28 LAB — CBC
HCT: 23.4 % — ABNORMAL LOW (ref 36.0–46.0)
Hemoglobin: 7.5 g/dL — ABNORMAL LOW (ref 12.0–15.0)
MCH: 30.6 pg (ref 26.0–34.0)
MCHC: 32.1 g/dL (ref 30.0–36.0)
MCV: 95.5 fL (ref 80.0–100.0)
Platelets: 185 10*3/uL (ref 150–400)
RBC: 2.45 MIL/uL — ABNORMAL LOW (ref 3.87–5.11)
RDW: 14.3 % (ref 11.5–15.5)
WBC: 7.1 10*3/uL (ref 4.0–10.5)
nRBC: 0 % (ref 0.0–0.2)

## 2021-02-28 LAB — BASIC METABOLIC PANEL
Anion gap: 5 (ref 5–15)
BUN: 9 mg/dL (ref 6–20)
CO2: 28 mmol/L (ref 22–32)
Calcium: 8.6 mg/dL — ABNORMAL LOW (ref 8.9–10.3)
Chloride: 107 mmol/L (ref 98–111)
Creatinine, Ser: 0.3 mg/dL — ABNORMAL LOW (ref 0.44–1.00)
GFR, Estimated: >60 mL/min (ref >60)
Glucose, Bld: 115 mg/dL — ABNORMAL HIGH (ref 70–99)
Potassium: 3.3 mmol/L — ABNORMAL LOW (ref 3.5–5.1)
Sodium: 140 mmol/L (ref 135–145)

## 2021-02-28 LAB — QUANTIFERON-TB GOLD PLUS (RQFGPL)
QuantiFERON Mitogen Value: 0.86 IU/mL
QuantiFERON Nil Value: 0.02 IU/mL
QuantiFERON TB1 Ag Value: 0.02 IU/mL
QuantiFERON TB2 Ag Value: 0.02 IU/mL

## 2021-02-28 LAB — HEPATIC FUNCTION PANEL
ALT: 8 U/L (ref 0–44)
AST: 13 U/L — ABNORMAL LOW (ref 15–41)
Albumin: 3.3 g/dL — ABNORMAL LOW (ref 3.5–5.0)
Alkaline Phosphatase: 20 U/L — ABNORMAL LOW (ref 38–126)
Bilirubin, Direct: <0.1 mg/dL (ref 0.0–0.2)
Total Bilirubin: 0.3 mg/dL (ref 0.3–1.2)
Total Protein: 4.8 g/dL — ABNORMAL LOW (ref 6.5–8.1)

## 2021-02-28 LAB — MAGNESIUM: Magnesium: 2.1 mg/dL (ref 1.7–2.4)

## 2021-02-28 LAB — QUANTIFERON-TB GOLD PLUS: QuantiFERON-TB Gold Plus: NEGATIVE

## 2021-02-28 LAB — PHOSPHORUS: Phosphorus: 2.2 mg/dL — ABNORMAL LOW (ref 2.5–4.6)

## 2021-02-28 LAB — GLUCOSE, CAPILLARY
Glucose-Capillary: 108 mg/dL — ABNORMAL HIGH (ref 70–99)
Glucose-Capillary: 109 mg/dL — ABNORMAL HIGH (ref 70–99)
Glucose-Capillary: 94 mg/dL (ref 70–99)
Glucose-Capillary: 126 mg/dL — ABNORMAL HIGH (ref 70–99)
Glucose-Capillary: 118 mg/dL — ABNORMAL HIGH (ref 70–99)

## 2021-02-28 MED ORDER — CLONAZEPAM 0.25 MG PO TBDP
0.5000 mg | ORAL_TABLET | Freq: Every morning | ORAL | Status: DC
  Administered 2021-02-28 – 2021-03-05 (×6): 0.5 mg
  Filled 2021-02-28 (×6): qty 2

## 2021-02-28 MED ORDER — VITAL 1.5 CAL PO LIQD
1000.0000 mL | ORAL | Status: DC
  Administered 2021-02-28 – 2021-03-01 (×2): 1000 mL

## 2021-02-28 MED ORDER — CLONAZEPAM 0.25 MG PO TBDP: 0.5000 mg | ORAL_TABLET | Freq: Every morning | ORAL | Status: DC

## 2021-02-28 MED ORDER — DIPHENHYDRAMINE HCL 25 MG PO CAPS
50.0000 mg | ORAL_CAPSULE | Freq: Once | ORAL | Status: DC
  Filled 2021-02-28: qty 2

## 2021-02-28 MED ORDER — EPINEPHRINE PF 1 MG/ML IJ SOLN: 0.3000 mg | INTRAMUSCULAR | Status: DC | PRN

## 2021-02-28 MED ORDER — METHYLPREDNISOLONE SODIUM SUCC 125 MG IJ SOLR: 125.0000 mg | Freq: Once | INTRAMUSCULAR | Status: DC | PRN

## 2021-02-28 MED ORDER — ALBUTEROL SULFATE (2.5 MG/3ML) 0.083% IN NEBU: 2.5000 mg | INHALATION_SOLUTION | Freq: Once | RESPIRATORY_TRACT | Status: DC | PRN

## 2021-02-28 MED ORDER — DIPHENHYDRAMINE HCL 50 MG/ML IJ SOLN: 50.0000 mg | Freq: Once | INTRAMUSCULAR | Status: DC | PRN

## 2021-02-28 MED ORDER — CLONAZEPAM 0.25 MG PO TBDP
1.0000 mg | ORAL_TABLET | Freq: Every day | ORAL | Status: DC
  Administered 2021-02-28 – 2021-03-15 (×16): 1 mg
  Filled 2021-02-28 (×18): qty 4

## 2021-02-28 MED ORDER — SODIUM CHLORIDE 0.9 % IV BOLUS: 1000.0000 mL | Freq: Once | INTRAVENOUS | Status: DC | PRN

## 2021-02-28 MED ORDER — SODIUM CHLORIDE 0.9 % IV SOLN
1000.0000 mg | Freq: Once | INTRAVENOUS | Status: DC
  Filled 2021-02-28: qty 100

## 2021-02-28 MED ORDER — FAMOTIDINE IN NACL 20-0.9 MG/50ML-% IV SOLN
20.0000 mg | Freq: Once | INTRAVENOUS | Status: DC | PRN
  Filled 2021-02-28: qty 50

## 2021-02-28 MED ORDER — CLONAZEPAM 0.25 MG PO TBDP: 1.0000 mg | ORAL_TABLET | Freq: Every day | ORAL | Status: DC

## 2021-02-28 MED ORDER — POTASSIUM CHLORIDE 20 MEQ PO PACK
40.0000 meq | PACK | ORAL | Status: AC
  Administered 2021-02-28 (×2): 40 meq
  Filled 2021-02-28 (×2): qty 2

## 2021-02-28 NOTE — Progress Notes (Signed)
NAME:  Jamie [name], MRN:  [id number], DOB:  Mar 10, 2001, LOS: 21 ADMISSION DATE:  02/06/2021, CONSULTATION DATE:  10/28 REFERRING MD:  Raymondo Band, CHIEF COMPLAINT:  Seizure, acute metabolic encephalopathy   History of Present Illness:  20 y/o female admitted with seizure and change in mental status.  She was treated with steroids and IVIg but despite this her condition worsened.  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  After treatment her mental status worsened and she developed inability to protect her airway in the setting of tachypnea and tachycardia.  She required intubation on 10/28.   Pertinent  Medical History  Acquired autoimmune hypothyroidism  Significant Hospital Events: Including procedures, antibiotic start and stop dates in addition to other pertinent events   10/16 Admit 10/25 New onset seizure activity, PCCM consult 1026 pulmonary critical care signed off 02/19/2021 pulmonary critical care reconsulted for suspected respiratory distress.  intubated, continuous EEG started 10/30 starting plasmapheresis 10/31 MRI normal, EEG with GPD+ 11/1 RN reports pt seized while in MRI , additional agents added overnight.   11/5 completed 4th session of PLEX   Micro/labs: 10/16 influenza AB PCR negative, SARS-CoV-2 PCR negative 10/16 HIV antibody negative 10/16 urine culture multiple species suggest recollection 10/16 urine drug screen negative, alcohol less than 10 10/17 CSF lymphocytic pleocytosis, fungal culture negative, CSF culture negative 10/17 CSF HSV 1 DNA negative, HSV-2 DNA negative, VZV PCR CSF negative 10/17 CSF cryptococcal antigen negative, cryptococcal antigen titer not indicated 10/17 CSF EBV positive  10/17 CMV qualitative CSF negative 10/17 NMDA IgG on CSF > 1:2560 10/18 HIV RNA quant less than 20 10/20 NMDA IgG serum: positive 1:1000 10/22 RPR nonreactive 10/22 QuantiFERON gold intermediate 10/23 EBV DNA QuantiFERON by PCR positive,  less than 35 10/25 hepatitis B surface antigen nonreactive, hepatitis B core total antibody positive, hepatitis B antibody positive, hepatitis B DNA not detected 10/25 hepatitis C antibody negative 10/26 blood culture no growth to date  Imaging: 10/16 CT head negative 10/16 MRI brain incomplete study due to patient intolerance without evidence of acute intracranial abnormality 10/22 chest abdomen pelvis CT scan no acute intrathoracic abdominal or pelvic pathology, no evidence of malignancy 10/22 MRI brain normal MRI of the brain 10/26 pelvic ultrasound right ovary not visualized, normal appearance of left ovary and uterus, trace free fluid in pelvis 10/27 pelvic MRI: Normal pelvic MRI, symmetric normal ovaries, no adnexal masses 10/31 MRI > no acute infarct or hemorrhage. Normal MRI.   Procedures: 10/28 endotracheal tube >  10/29 R IJ HD catheter >  11/1 RUE PICC >  Interim History / Subjective:  No acute events overnight.  PLEX completed yesterday, 4th treatment.  Mother is at bedside.  Not following commands.   Objective   Blood pressure (!) 112/53, pulse 87, temperature 98.4 F (36.9 C), temperature source Axillary, resp. rate 15, height 5\' 2"  (1.575 m), weight 58 kg, last menstrual period 01/11/2021, SpO2 100 %.    Vent Mode: PRVC FiO2 (%):  [30 %] 30 % Set Rate:  [15 bmp] 15 bmp Vt Set:  [400 mL] 400 mL PEEP:  [5 cmH20] 5 cmH20 Pressure Support:  [10 cmH20] 10 cmH20 Plateau Pressure:  [14 cmH20-17 cmH20] 14 cmH20   Intake/Output Summary (Last 24 hours) at 02/28/2021 0738 Last data filed at 02/28/2021 0600 Gross per 24 hour  Intake 3822.9 ml  Output 1800 ml  Net 2022.9 ml   Filed Weights   02/25/21 0349 02/25/21 1031 02/26/21 0500  Weight: 56.7 kg 58  kg 58 kg   Examination: General: young woman, ill appearing, intubated HENT: [place]/AT, moist mucous membranes, sclera anicteric Lungs: clear to auscultation, no wheezing or rhonchi Cardiovascular: rrr, no  murmurs Abdomen: soft, non-distended, bowel sounds present Extremities: warm, no edema Neuro: not following commands, PERRL, cough to suction GU: external catheter present  Resolved Hospital Problem list     Assessment & Plan:  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  MR Pelvis negative for ovarian teratoma. - PLEX started 10/30 with serial treatments on 11/1, 11/3, 11/5 and 5th/final session scheduled 11/7 - Plan for rituxan infusion late afternoon on 11/7 after last PLEX treatment.  - Appreciate Neurology input - Continue clonazepam 1mg  BID for dyskinesis - Continue depakote 500mg  q8h BID and keppra 1500 BID -tramadol added as some evidence of dyskinesia improvement in conjunction with other therapies; there are case reports of several other add on therapies but will need to be discussion with neurology to try to figure out; apparently clinical response to therapies requires weeks  Acute respiratory failure with hypoxemia due to inability to protect airway -PRVC 8cc/kg  -PEEP /fiO2 for sats >90% - Continue PSV trials as tolerated -follow CXR -Will likely need tracheostomy this coming week. I discussed this possibility with the mother   LLL HCAP, MSSA - continue cefazolin, plan for 7 day course  Hypothyroidism -continue synthroid   Hypokalemia Hypophosphatemia -monitor, replace as indicated   Anemia  -trend CBC  -transfuse for Hgb <7% or active bleeding   Colonic Distension - noted on KUB 11/3 - she is having bowel movements - continue to monitor  Constipation  BM 11/2 -bowel regimen as ordered  Nutrition - increase tubefeeds to 40/hr today which is her goal  Best Practice (right click and "Reselect all SmartList Selections" daily)  Diet/type:  tubefeeds DVT prophylaxis: LMWH GI prophylaxis: PPI Lines: N/A Foley:  Yes, and it is still needed Code Status:  full code Last date of multidisciplinary goals of care discussion -full code.     Critical care time: 35 minutes    Freda Jackson, MD [facility] Office: [phone]   See Amion for personal pager PCCM on call pager [phone] until 7pm. Please call Elink 7p-7a. [phone]

## 2021-02-28 NOTE — Progress Notes (Signed)
Neurology Progress Note  Subjective: PLEX 4/5 completed 11/5 with last dose scheduled for tomorrow 11/7 LTM discontinued due to avoid skin breakdown yesterday, will likely rehook EEG after the weekend No further dyskinetic movements noted on examination today  Exam: Vitals:   02/28/21 0955 02/28/21 1000  BP: 125/62 (!) 101/51  Pulse: 76 75  Resp:  12  Temp:    SpO2:  99%   Gen: Critically ill appearing female. Intubated, not on sedation  Resp: respirations assisted via mechanical ventilator, patient with tachypnea on pressure support with respiratory rate into the 30's during examination Abd: soft, non-distended  Neuro: Mental Status: Intubated, not on sedation in the ICU.  She does not open eyes throughout assessment She does not follow commands, does not respond to pain. There are no further noted dyskinetic movements during examination this morning.  Cranial Nerves: PERRL 6 mm/brisk, eyes dysconjugate with VOR intact, face appears symmetric within limitation of evaluation from ETT and securement device, unable to assess hearing.   Motor: There is no response to noxious stimuli in bilateral upper or lower extremities.  Patient is flaccid throughout. Bulk is normal. Sensory: No response to noxious stimuli throughout.  DTR: Areflexic in left patellar, 2+ right patellar, 2+ and symmetric biceps and brachioradialis.  Gait: Deferred for patient safety  Pertinent Labs: CBC    Component Value Date/Time   WBC 7.1 02/28/2021 0607   RBC 2.45 (L) 02/28/2021 0607   HGB 7.5 (L) 02/28/2021 0607   HCT 23.4 (L) 02/28/2021 0607   PLT 185 02/28/2021 0607   MCV 95.5 02/28/2021 0607   MCH 30.6 02/28/2021 0607   MCHC 32.1 02/28/2021 0607   RDW 14.3 02/28/2021 0607   LYMPHSABS 0.7 02/21/2021 0700   MONOABS 0.6 02/21/2021 0700   EOSABS 0.1 02/21/2021 0700   BASOSABS 0.0 02/21/2021 0700   CMP     Component Value Date/Time   NA 140 02/28/2021 0607   K 3.3 (L) 02/28/2021 0607   CL 107  02/28/2021 0607   CO2 28 02/28/2021 0607   GLUCOSE 115 (H) 02/28/2021 0607   BUN 9 02/28/2021 0607   CREATININE 0.30 (L) 02/28/2021 0607   CALCIUM 8.6 (L) 02/28/2021 0607   PROT 4.8 (L) 02/28/2021 0607   ALBUMIN 3.3 (L) 02/28/2021 0607   ALBUMIN 4.5 02/08/2021 1531   AST 13 (L) 02/28/2021 0607   ALT 8 02/28/2021 0607   ALKPHOS 20 (L) 02/28/2021 0607   BILITOT 0.3 02/28/2021 0607   GFRNONAA >60 02/28/2021 0607   10/17 CSF EBV positive  10/17 NMDA IgG on CSF > 1:2560 10/20 NMDA IgG serum: positive 1:1000  Imaging Reviewed:  MRI brain 10/31: Normal brain MRI.  cEEG 02/25/2021 - 02/26/2021: "This study is suggestive of moderate to severe diffuse encephalopathy, nonspecific etiology but likely secondary to NMDA encephalitis as well as sedation. No definite seizures or epileptiform discharges were seen during this study."  Assessment: 20 y.o. 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 intake for 4 days and decreased verbalization 10/15 prior to arrival. Initial concerns for Hashimoto's encephalitis but extensive workup revealed CSF pleocytosis (273, mononuclear), positive NMDA receptor IgG, elevated IgG index and 3 oligoclonal bands in the CSF that are not noted in the serum. R frontal seizure on cEEG on 10/25, has not had any since she has been on Keppra. Her presentation is c/w NMDA receptor encephalitis. Viral encephalitis felt unlikely given no fever, viral PCR studies negative  and NMDAR encephalitis felt more likely. She completed 5 days of high dose IV Solumedrol and completed IVIG x 5 doses. She completed 3rd session of planned 5 PLEX yesterday.  Has had concerns for ST elevation on EKG-cardiology following with concern for acute pericarditis  Exam remains very poor. Klonopin was increased a couple of days ago and I suspect that that also may be contributing to some clouding of her mental status.  Impression:   NMDA encephalitis Acute encephalopathy, due to NMDA encephalitis and sedation and seizures New onset seizures Acute respiratory failure Dyskinesias  Recommendations: - Discontinued LTM 11/4 with patient seizure free for 24 hours to prevent skin breakdown - consider rehooking patient to EEG Monday given poor exam is difficult to follow clinically -Duration reported exam-change Klonopin to 0.5 qAM and 1mg  qHS  for dyskinesias.  Also added tramadol based on literature search by PCCM for improvement of dyskinesias. - Continue Depakote 500 mg q8h and Keppra 1,500 mg BID - Continue seizure precautions - PLEX 5/5 scheduled for completion 11/7 - Rituxan after PLEX - 11/8  Anibal Henderson, AGACNP-BC [facility] [phone]  Attending Neurohospitalist Addendum Patient seen and examined with APP/Resident. Agree with the history and physical as documented above. Agree with the plan as documented, which I helped formulate. I have independently reviewed the chart, obtained history, review of systems and examined the patient.I have personally reviewed pertinent head/neck/spine imaging (CT/MRI). Discussed the plan in detail with the mother at bedside Discussed plan in detail with Dr. Erin Fulling on the unit Please feel free to call with any questions.  -- Amie Portland, MD Neurologist [facility] Pager: [phone]   CRITICAL CARE ATTESTATION Performed by: Amie Portland, MD Total critical care time: 33 minutes Critical care time was exclusive of separately billable procedures and treating other patients and/or supervising APPs/Residents/Students Critical care was necessary to treat or prevent imminent or life-threatening deterioration due to NMDAR encephalitis This patient is critically ill and at significant risk for neurological worsening and/or death and care requires constant monitoring. Critical care was time spent personally by me on the following activities:  development of treatment plan with patient and/or surrogate as well as nursing, discussions with consultants, evaluation of patient's response to treatment, examination of patient, obtaining history from patient or surrogate, ordering and performing treatments and interventions, ordering and review of laboratory studies, ordering and review of radiographic studies, pulse oximetry, re-evaluation of patient's condition, participation in multidisciplinary rounds and medical decision making of high complexity in the care of this patient.

## 2021-03-01 ENCOUNTER — Inpatient Hospital Stay (HOSPITAL_COMMUNITY): Payer: Medicaid Other

## 2021-03-01 DIAGNOSIS — J9601 Acute respiratory failure with hypoxia: Secondary | ICD-10-CM

## 2021-03-01 DIAGNOSIS — E039 Hypothyroidism, unspecified: Secondary | ICD-10-CM | POA: Diagnosis not present

## 2021-03-01 DIAGNOSIS — G0490 Encephalitis and encephalomyelitis, unspecified: Secondary | ICD-10-CM | POA: Diagnosis not present

## 2021-03-01 DIAGNOSIS — H5704 Mydriasis: Secondary | ICD-10-CM

## 2021-03-01 DIAGNOSIS — E876 Hypokalemia: Secondary | ICD-10-CM

## 2021-03-01 LAB — GLUCOSE, CAPILLARY
Glucose-Capillary: 109 mg/dL — ABNORMAL HIGH (ref 70–99)
Glucose-Capillary: 111 mg/dL — ABNORMAL HIGH (ref 70–99)
Glucose-Capillary: 112 mg/dL — ABNORMAL HIGH (ref 70–99)
Glucose-Capillary: 119 mg/dL — ABNORMAL HIGH (ref 70–99)
Glucose-Capillary: 124 mg/dL — ABNORMAL HIGH (ref 70–99)
Glucose-Capillary: 85 mg/dL (ref 70–99)
Glucose-Capillary: 99 mg/dL (ref 70–99)

## 2021-03-01 LAB — URINALYSIS, ROUTINE W REFLEX MICROSCOPIC
Bilirubin Urine: NEGATIVE
Glucose, UA: NEGATIVE mg/dL
Hgb urine dipstick: NEGATIVE
Ketones, ur: NEGATIVE mg/dL
Leukocytes,Ua: NEGATIVE
Nitrite: NEGATIVE
Protein, ur: NEGATIVE mg/dL
Specific Gravity, Urine: 1.024 (ref 1.005–1.030)
pH: 8 (ref 5.0–8.0)

## 2021-03-01 LAB — CBC
HCT: 24.1 % — ABNORMAL LOW (ref 36.0–46.0)
Hemoglobin: 7.4 g/dL — ABNORMAL LOW (ref 12.0–15.0)
MCH: 29.7 pg (ref 26.0–34.0)
MCHC: 30.7 g/dL (ref 30.0–36.0)
MCV: 96.8 fL (ref 80.0–100.0)
Platelets: 300 10*3/uL (ref 150–400)
RBC: 2.49 MIL/uL — ABNORMAL LOW (ref 3.87–5.11)
RDW: 14.6 % (ref 11.5–15.5)
WBC: 9.5 10*3/uL (ref 4.0–10.5)
nRBC: 0.5 % — ABNORMAL HIGH (ref 0.0–0.2)

## 2021-03-01 LAB — CULTURE, FUNGUS WITHOUT SMEAR

## 2021-03-01 LAB — BASIC METABOLIC PANEL
Anion gap: 6 (ref 5–15)
BUN: 8 mg/dL (ref 6–20)
CO2: 29 mmol/L (ref 22–32)
Calcium: 8.9 mg/dL (ref 8.9–10.3)
Chloride: 108 mmol/L (ref 98–111)
Creatinine, Ser: 0.37 mg/dL — ABNORMAL LOW (ref 0.44–1.00)
GFR, Estimated: >60 mL/min (ref >60)
Glucose, Bld: 119 mg/dL — ABNORMAL HIGH (ref 70–99)
Potassium: 3.6 mmol/L (ref 3.5–5.1)
Sodium: 143 mmol/L (ref 135–145)

## 2021-03-01 LAB — HEPATIC FUNCTION PANEL
ALT: 11 U/L (ref 0–44)
AST: 19 U/L (ref 15–41)
Albumin: 3.1 g/dL — ABNORMAL LOW (ref 3.5–5.0)
Alkaline Phosphatase: 27 U/L — ABNORMAL LOW (ref 38–126)
Bilirubin, Direct: <0.1 mg/dL (ref 0.0–0.2)
Total Bilirubin: 0.3 mg/dL (ref 0.3–1.2)
Total Protein: 5.2 g/dL — ABNORMAL LOW (ref 6.5–8.1)

## 2021-03-01 LAB — MISC LABCORP TEST (SEND OUT): Labcorp test code: 9985

## 2021-03-01 LAB — MAGNESIUM: Magnesium: 2.1 mg/dL (ref 1.7–2.4)

## 2021-03-01 MED ORDER — FENTANYL CITRATE PF 50 MCG/ML IJ SOSY
200.0000 ug | PREFILLED_SYRINGE | Freq: Once | INTRAMUSCULAR | Status: DC
  Filled 2021-03-01: qty 4

## 2021-03-01 MED ORDER — SODIUM CHLORIDE 0.9 % IV SOLN
INTRAVENOUS | Status: AC
  Filled 2021-03-01 (×3): qty 200

## 2021-03-01 MED ORDER — VECURONIUM BROMIDE 10 MG IV SOLR
10.0000 mg | Freq: Once | INTRAVENOUS | Status: AC
  Administered 2021-03-02: 10 mg via INTRAVENOUS
  Filled 2021-03-01: qty 10

## 2021-03-01 MED ORDER — CALCIUM GLUCONATE-NACL 2-0.675 GM/100ML-% IV SOLN
INTRAVENOUS | Status: AC
  Filled 2021-03-01: qty 100

## 2021-03-01 MED ORDER — PROPOFOL 10 MG/ML IV BOLUS
500.0000 mg | Freq: Once | INTRAVENOUS | Status: DC
  Filled 2021-03-01: qty 60

## 2021-03-01 MED ORDER — LORAZEPAM 2 MG/ML IJ SOLN
2.0000 mg | INTRAMUSCULAR | Status: DC | PRN
  Administered 2021-03-01 – 2021-03-03 (×9): 2 mg via INTRAVENOUS
  Filled 2021-03-01 (×8): qty 1

## 2021-03-01 MED ORDER — MANNITOL 25 % IV SOLN
INTRAVENOUS | Status: AC
  Filled 2021-03-01: qty 100

## 2021-03-01 MED ORDER — LORAZEPAM 2 MG/ML IJ SOLN
INTRAMUSCULAR | Status: AC
  Filled 2021-03-01: qty 1

## 2021-03-01 MED ORDER — ACD FORMULA A 0.73-2.45-2.2 GM/100ML VI SOLN
1000.0000 mL | Status: DC
  Filled 2021-03-01: qty 1000

## 2021-03-01 MED ORDER — VITAL 1.5 CAL PO LIQD
1000.0000 mL | ORAL | Status: DC
  Administered 2021-03-01 – 2021-03-16 (×13): 1000 mL
  Filled 2021-03-01 (×2): qty 1000

## 2021-03-01 MED ORDER — ACETAMINOPHEN 325 MG PO TABS: 650.0000 mg | ORAL_TABLET | ORAL | Status: DC | PRN

## 2021-03-01 MED ORDER — METOPROLOL TARTRATE 25 MG PO TABS
25.0000 mg | ORAL_TABLET | Freq: Three times a day (TID) | ORAL | Status: DC
  Administered 2021-03-01 – 2021-03-10 (×27): 25 mg
  Filled 2021-03-01 (×26): qty 1

## 2021-03-01 MED ORDER — POTASSIUM CHLORIDE 20 MEQ PO PACK
40.0000 meq | PACK | Freq: Once | ORAL | Status: AC
  Administered 2021-03-01: 40 meq
  Filled 2021-03-01: qty 2

## 2021-03-01 MED ORDER — GADOBUTROL 1 MMOL/ML IV SOLN
5.5000 mL | Freq: Once | INTRAVENOUS | Status: AC | PRN
  Administered 2021-03-01: 5.5 mL via INTRAVENOUS

## 2021-03-01 MED ORDER — DIPHENHYDRAMINE HCL 25 MG PO CAPS: 25.0000 mg | ORAL_CAPSULE | Freq: Four times a day (QID) | ORAL | Status: DC | PRN

## 2021-03-01 MED ORDER — CALCIUM GLUCONATE-NACL 2-0.675 GM/100ML-% IV SOLN
2.0000 g | Freq: Once | INTRAVENOUS | Status: DC
  Filled 2021-03-01: qty 100

## 2021-03-01 MED ORDER — HEPARIN SODIUM (PORCINE) 1000 UNIT/ML IJ SOLN
INTRAMUSCULAR | Status: AC
  Filled 2021-03-01: qty 3

## 2021-03-01 MED ORDER — LORAZEPAM 2 MG/ML IJ SOLN: 2.0000 mg | INTRAMUSCULAR | Status: DC | PRN

## 2021-03-01 MED ORDER — MIDAZOLAM HCL 2 MG/2ML IJ SOLN
5.0000 mg | Freq: Once | INTRAMUSCULAR | Status: DC
  Filled 2021-03-01: qty 6

## 2021-03-01 MED ORDER — MANNITOL 25 % IV SOLN
100.0000 g | Freq: Once | INTRAVENOUS | Status: AC
  Filled 2021-03-01: qty 400

## 2021-03-01 MED ORDER — MANNITOL 25 % IV SOLN
INTRAVENOUS | Status: AC
  Filled 2021-03-01: qty 50

## 2021-03-01 MED ORDER — HEPARIN SODIUM (PORCINE) 1000 UNIT/ML IJ SOLN
1000.0000 [IU] | Freq: Once | INTRAMUSCULAR | Status: DC
  Filled 2021-03-01: qty 1

## 2021-03-01 MED ORDER — ETOMIDATE 2 MG/ML IV SOLN
40.0000 mg | Freq: Once | INTRAVENOUS | Status: AC
  Administered 2021-03-02: 20 mg via INTRAVENOUS
  Filled 2021-03-01: qty 20

## 2021-03-01 MED ORDER — ACD FORMULA A 0.73-2.45-2.2 GM/100ML VI SOLN
Status: AC
  Filled 2021-03-01: qty 1000

## 2021-03-01 MED ORDER — MANNITOL 25 % IV SOLN
INTRAVENOUS | Status: AC
  Administered 2021-03-01: 100 g
  Filled 2021-03-01: qty 100

## 2021-03-01 NOTE — Progress Notes (Signed)
RT transported patient from 4N28 to MRI and back with RN. No complications. RT will continue to monitor.

## 2021-03-01 NOTE — Progress Notes (Addendum)
CCM Interim Progress Note  While preparing for PLEX pt started to have no BLE movement. RN assessed neuro exam -- new pupil change, R pupil blown.   Immediately called to bedside by RN, examined at bedside-- agree w RN assessment, R pupil is blown and fixed. L pupil 3mm reactive. Ble movement has stopped.  Mildly hypertensive otherwise no real VS change during this or preceding   P Defer PLEX for now Sending for STAT CT H  100g Mannitol followed by 1L LR given Keep head end of the bed elevated  >30 degree RR was increased acutely from 15 to 25 for short period, considering exam is consistent with impending brain herniation.    Stat CTH showed no acute intracranial abnormalities. Rt pupil changed from 27mm to 64mm but remained non-reactive. Neurology was made aware, they will come and see the patient.   Additional critical care time: 30 minutes  Performed by: [place] care time was exclusive of separately billable procedures and treating other patients.   Critical care was necessary to treat or prevent imminent or life-threatening deterioration.   Critical care was time spent personally by me on the following activities: development of treatment plan with patient and/or surrogate as well as nursing, discussions with consultants, evaluation of patient's response to treatment, examination of patient, obtaining history from patient or surrogate, ordering and performing treatments and interventions, ordering and review of laboratory studies, ordering and review of radiographic studies, pulse oximetry and re-evaluation of patient's condition.   Jacky Kindle MD [facility] See Amion for pager If no response to pager, please call [phone] until 7pm After 7pm, Please call E-link [phone]

## 2021-03-01 NOTE — Consult Note (Signed)
Ref: Ok Edwards, MD   Subjective:  Intubated. Randomly moves all 4 extremities a little. Sinus tachycardia continues.  Objective:  Vital Signs in the last 24 hours: Temp:  [96.4 F (35.8 C)-101.8 F (38.8 C)] 99.5 F (37.5 C) (11/07 1635) Pulse Rate:  [82-123] 107 (11/07 1513) Cardiac Rhythm: Normal sinus rhythm (11/07 0728) Resp:  [15-31] 16 (11/07 1513) BP: (92-157)/(45-112) 122/74 (11/07 1513) SpO2:  [85 %-100 %] 85 % (11/07 1612) FiO2 (%):  [30 %] 30 % (11/07 1513) Weight:  [55.6 kg] 55.6 kg (11/07 0500)  Physical Exam: BP Readings from Last 1 Encounters:  03/01/21 122/74     Wt Readings from Last 1 Encounters:  03/01/21 55.6 kg (40 %, Z= -0.25)*   * Growth percentiles are based on CDC (Girls, 2-20 Years) data.    Weight change:  Body mass index is 22.42 kg/m. HEENT: [place]/AT, Eyes-Brown, Conjunctiva-Pink, Sclera-Non-icteric Neck: No JVD, No bruit, Trachea midline. Lungs:  Clear, Bilateral. Cardiac:  Regular rhythm, normal S1 and S2, no S3. II/VI systolic murmur. Abdomen:  Soft, non-tender. BS present. Extremities:  No edema present. No cyanosis. No clubbing. CNS: AxOx0.  Skin: Warm and dry.   Intake/Output from previous day: 11/06 0701 - 11/07 0700 In: 3497 [I.V.:2557.1; NG/GT:310; IV Piggyback:599.8] Out: 3750 [Urine:3600; Stool:150]    Lab Results: BMET    Component Value Date/Time   NA 142 03/01/2021 1609   NA 143 03/01/2021 0414   NA 140 02/28/2021 0607   K 3.7 03/01/2021 1609   K 3.6 03/01/2021 0414   K 3.3 (L) 02/28/2021 0607   CL 107 03/01/2021 1609   CL 108 03/01/2021 0414   CL 107 02/28/2021 0607   CO2 29 03/01/2021 1609   CO2 29 03/01/2021 0414   CO2 28 02/28/2021 0607   GLUCOSE 105 (H) 03/01/2021 1609   GLUCOSE 119 (H) 03/01/2021 0414   GLUCOSE 115 (H) 02/28/2021 0607   BUN 8 03/01/2021 1609   BUN 8 03/01/2021 0414   BUN 9 02/28/2021 0607   CREATININE 0.36 (L) 03/01/2021 1609   CREATININE 0.37 (L) 03/01/2021 0414   CREATININE  0.30 (L) 02/28/2021 0607   CALCIUM 8.7 (L) 03/01/2021 1609   CALCIUM 8.9 03/01/2021 0414   CALCIUM 8.6 (L) 02/28/2021 0607   GFRNONAA >60 03/01/2021 1609   GFRNONAA >60 03/01/2021 0414   GFRNONAA >60 02/28/2021 0607   CBC    Component Value Date/Time   WBC 9.5 03/01/2021 0414   RBC 2.49 (L) 03/01/2021 0414   HGB 7.4 (L) 03/01/2021 0414   HCT 24.1 (L) 03/01/2021 0414   PLT 300 03/01/2021 0414   MCV 96.8 03/01/2021 0414   MCH 29.7 03/01/2021 0414   MCHC 30.7 03/01/2021 0414   RDW 14.6 03/01/2021 0414   LYMPHSABS 0.7 02/21/2021 0700   MONOABS 0.6 02/21/2021 0700   EOSABS 0.1 02/21/2021 0700   BASOSABS 0.0 02/21/2021 0700   HEPATIC Function Panel Recent Labs    02/27/21 0325 02/28/21 0607 03/01/21 0414  PROT 4.7* 4.8* 5.2*   HEMOGLOBIN A1C No components found for: HGA1C,  MPG CARDIAC ENZYMES Lab Results  Component Value Date   CKTOTAL 169 02/16/2021   BNP No results for input(s): PROBNP in the last 8760 hours. TSH Recent Labs    05/08/20 1625 07/07/20 1449 02/06/21 1747  TSH 18.75* 2.56 10.095*   CHOLESTEROL Recent Labs    02/09/21 0344  CHOL 108    Scheduled Meds:  chlorhexidine gluconate (MEDLINE KIT)  15 mL Mouth Rinse  BID   Chlorhexidine Gluconate Cloth  6 each Topical Daily   clonazePAM  0.5 mg Per Tube q AM   clonazepam  1 mg Per Tube QHS   colchicine  0.6 mg Per Tube Daily   [START ON 03/02/2021] diphenhydrAMINE  50 mg Per Tube Once   docusate  100 mg Per Tube BID   enoxaparin (LOVENOX) injection  40 mg Subcutaneous Q24H   [START ON 03/02/2021] etomidate  40 mg Intravenous Once   feeding supplement (PROSource TF)  45 mL Per Tube TID   [START ON 03/02/2021] fentaNYL (SUBLIMAZE) injection  200 mcg Intravenous Once   heparin sodium (porcine)       heparin sodium (porcine)  1,000 Units Intracatheter Once   levothyroxine  100 mcg Per Tube Q0600   mannitol       mannitol       mouth rinse  15 mL Mouth Rinse 10 times per day   metoCLOPramide  (REGLAN) injection  10 mg Intravenous Q8H   metoprolol tartrate  25 mg Per Tube TID   [START ON 03/02/2021] midazolam  5 mg Intravenous Once   pantoprazole sodium  40 mg Per Tube QHS   polyethylene glycol  17 g Per Tube BID   [START ON 03/02/2021] propofol  500 mg Intravenous Once   scopolamine  1 patch Transdermal Q72H   senna-docusate  2 tablet Per NG tube BID   sodium chloride flush  10-40 mL Intracatheter Q12H   tenofovir  300 mg Per Tube Daily   thiamine  100 mg Per Tube Daily   traMADol  50 mg Per Tube Q6H   valproic acid  500 mg Per Tube Q8H   [START ON 03/02/2021] vecuronium  10 mg Intravenous Once   Continuous Infusions:  sodium chloride 10 mL/hr at 03/01/21 1200   sodium chloride 100 mL/hr at 03/01/21 1200   calcium gluconate     calcium gluconate      ceFAZolin (ANCEF) IV 2 g (03/01/21 1512)   citrate dextrose     citrate dextrose     citrate dextrose     [START ON 03/02/2021] famotidine (PEPCID) IV     feeding supplement (VITAL 1.5 CAL) 1,000 mL (03/01/21 0952)   levETIRAcetam Stopped (03/01/21 1003)   [START ON 03/02/2021] riTUXimab (RITUXAN)  NON-ONCOLOGY  infusion     [START ON 03/02/2021] sodium chloride     PRN Meds:.acetaminophen, acetaminophen, [START ON 03/02/2021] albuterol, atropine, diphenhydrAMINE, diphenhydrAMINE, [START ON 03/02/2021] diphenhydrAMINE, [START ON 03/02/2021] EPINEPHrine, [START ON 03/02/2021] famotidine (PEPCID) IV, fentaNYL (SUBLIMAZE) injection, heparin, LORazepam, [START ON 03/02/2021] methylPREDNISolone (SOLU-MEDROL) injection, [START ON 03/02/2021] sodium chloride, sodium chloride flush  Assessment/Plan:  Acute pericarditis with small pericardial effusion Acute NMDAR encephalitis Acute respiratory failure with hypoxia Seizure disorder Sinus tachycardia  Plan: Increase metoprolol to 25 mg. Tid.   LOS: 22 days   Time spent including chart review, lab review, examination, discussion with patient/Mom/Nurse : 30 min   Dixie Dials  MD   03/01/2021, 6:54 PM

## 2021-03-01 NOTE — Progress Notes (Signed)
RT transported patient from 4N28 to CT and back with RN. No complications. RT will continue to monitor.

## 2021-03-01 NOTE — Progress Notes (Signed)
NAME:  Jamie [name], MRN:  [id number], DOB:  11/06/2000, LOS: 57 ADMISSION DATE:  02/06/2021, CONSULTATION DATE:  10/28 REFERRING MD:  Raymondo Band, CHIEF COMPLAINT:  Seizure, acute metabolic encephalopathy   History of Present Illness:  20 y/o female admitted with seizure and change in mental status.  She was treated with steroids and IVIg but despite this her condition worsened.  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  After treatment her mental status worsened and she developed inability to protect her airway in the setting of tachypnea and tachycardia.  She required intubation on 10/28.   Pertinent  Medical History  Acquired autoimmune hypothyroidism  Significant Hospital Events: Including procedures, antibiotic start and stop dates in addition to other pertinent events   10/16 Admit 10/25 New onset seizure activity, PCCM consult 1026 pulmonary critical care signed off 02/19/2021 pulmonary critical care reconsulted for suspected respiratory distress.  intubated, continuous EEG started 10/30 starting plasmapheresis 10/31 MRI normal, EEG with GPD+ 11/1 RN reports pt seized while in MRI , additional agents added overnight.   11/5 completed 4th session of PLEX 11/7 due for 5/5 PLEX.    Micro/labs: 10/16 influenza AB PCR negative, SARS-CoV-2 PCR negative 10/16 HIV antibody negative 10/16 urine culture multiple species suggest recollection 10/16 urine drug screen negative, alcohol less than 10 10/17 CSF lymphocytic pleocytosis, fungal culture negative, CSF culture negative 10/17 CSF HSV 1 DNA negative, HSV-2 DNA negative, VZV PCR CSF negative 10/17 CSF cryptococcal antigen negative, cryptococcal antigen titer not indicated 10/17 CSF EBV positive  10/17 CMV qualitative CSF negative 10/17 NMDA IgG on CSF > 1:2560 10/18 HIV RNA quant less than 20 10/20 NMDA IgG serum: positive 1:1000 10/22 RPR nonreactive 10/22 QuantiFERON gold intermediate 10/23 EBV DNA  QuantiFERON by PCR positive, less than 35 10/25 hepatitis B surface antigen nonreactive, hepatitis B core total antibody positive, hepatitis B antibody positive, hepatitis B DNA not detected 10/25 hepatitis C antibody negative 10/26 blood culture no growth to date 11/2 Tracheal aspirate abundant MSSA   Imaging: 10/16 CT head negative 10/16 MRI brain incomplete study due to patient intolerance without evidence of acute intracranial abnormality 10/22 chest abdomen pelvis CT scan no acute intrathoracic abdominal or pelvic pathology, no evidence of malignancy 10/22 MRI brain normal MRI of the brain 10/26 pelvic ultrasound right ovary not visualized, normal appearance of left ovary and uterus, trace free fluid in pelvis 10/27 pelvic MRI: Normal pelvic MRI, symmetric normal ovaries, no adnexal masses 10/31 MRI > no acute infarct or hemorrhage. Normal MRI.   Procedures: 10/28 endotracheal tube >  10/29 R IJ HD catheter >  11/1 RUE PICC >  Interim History / Subjective:  Overnight there may have been seizure activity -- night nurse relayed this and overnight ativan administration (2mg  x3 between 0300 and 0500) to day nurse but there are no notes to review with details/information about this  Tmax 101.8 this morning around time of possible seizures   Mother is at bedside. We discussed plan of care and plans for rituximab tomorrow after PLEX has been completed today   Objective   Blood pressure (!) 96/56, pulse 88, temperature 98.8 F (37.1 C), temperature source Axillary, resp. rate 16, height 5\' 2"  (1.575 m), weight 55.6 kg, last menstrual period 01/11/2021, SpO2 100 %.    Vent Mode: PRVC FiO2 (%):  [30 %] 30 % Set Rate:  [15 bmp] 15 bmp Vt Set:  [400 mL] 400 mL PEEP:  [5 cmH20] 5 cmH20 Plateau Pressure:  [  13 cmH20-15 cmH20] 15 cmH20   Intake/Output Summary (Last 24 hours) at 03/01/2021 1016 Last data filed at 03/01/2021 2122 Gross per 24 hour  Intake 4037.15 ml  Output 3175 ml  Net  862.15 ml   Filed Weights   02/25/21 1031 02/26/21 0500 03/01/21 0500  Weight: 58 kg 58 kg 55.6 kg   Examination: General: Critically ill appearing wdwn 20-year-old F intubated off sedation NAD  Neuro: Pupils are 53mm ERRL. Repeating Downward roving movement. No response to painful stimuli. HENT: NCAT pink mmm ETT OGT secure. Lungs: Mechanically ventilated. CTA. Symmetrical chest expansion  Cardiovascular: rrr, cap refill brisk. S1s2  Abdomen: soft ndnt +bowel sounds  GU: external catheter  Extremities: no acute joint deformity no cyanosis or clubbing  Skin: c/d/w. There is regular, repeating oblong skin hyperpigmentation superior to umbilicus extending down BLE superior to ankles. Not raised, no erythema, no plaque or granulomatous appearing tissue    Resolved Hospital Problem list     Assessment & Plan:    NMDA Receptor Encephalitis  - received IVIG and steroids with no clinical improvement - Discussed transfer to [facility] neuro-intensive care unit on 10/29, they recommend initiation of plasmapheresis followed by evaluation of clinical response, consideration of ECT.   -MR Pelvis negative for ovarian teratoma. - PLEX 10/30, 11/1, 11/3, 11/5 final due 11/7  P -5/5 PLEX 11/7 -rituximab 11/8 -BID clonazepam, tramadol for dyskinesia  -Cont Keppra and depakote -expect that response to therapies will take weeks  Seizure  -10/25. Was taken off cEEG 11/5-11/6, possible seizures 11/7 early AM per night nurse. Received 2mg  ativan x3  P -back on cEEG 11/7 -PRN ativan for sz >5 minutes  Acute respiratory failure with hypoxemia due to inability to protect airway  LLL MSSA HCAP P -cont MV support -VAP, pulm hygiene -day 10 ETT -- expect will need trach, discussed with pt mother at bedside 11/7 -cont abx for 7d course cefazolin  Acute pericarditis P -cont colchicine   Hypokalemia Hypophosphatemia -monitor, replace as indicated   Anemia, stable  P -trend CBC -hgb goal >  7  Hashimoto thyroiditis  -synthroid   Inadequate PO intake -cont EN as per RDN -likely reasonable to consider PEG if receives trach. Otherwise, cortrak.    Best Practice (right click and "Reselect all SmartList Selections" daily)  Diet/type: tubefeeds DVT prophylaxis: LMWH GI prophylaxis: PPI Lines: Central line (PICC placed 11/1) Foley:  N/A Code Status:  full code Last date of multidisciplinary goals of care discussion -full code.     CRITICAL CARE Performed by: Cristal Generous   Total critical care time: 46 minutes  Critical care time was exclusive of separately billable procedures and treating other patients. Critical care was necessary to treat or prevent imminent or life-threatening deterioration.  Critical care was time spent personally by me on the following activities: development of treatment plan with patient and/or surrogate as well as nursing, discussions with consultants, evaluation of patient's response to treatment, examination of patient, obtaining history from patient or surrogate, ordering and performing treatments and interventions, ordering and review of laboratory studies, ordering and review of radiographic studies, pulse oximetry and re-evaluation of patient's condition.   Eliseo Gum MSN, AGACNP-BC [place] for pager  03/01/2021, 10:16 AM

## 2021-03-01 NOTE — Progress Notes (Signed)
Neurology Progress Note  Subjective: PLEX 4/5 completed 11/5 with last dose scheduled for today 11/7 LTM discontinued 11/4 to avoid skin breakdown yesterday, resumed 11/6 AM Having significant dyskinetic movements on examination today Per nursing had received multiple doses of Ativan to control dyskinetic movements just prior to start of shift at which time she was not having much movement.  She then began to have some dyskinetic movements of the bilateral lower extremities at around 3:30 PM, at which time she was noted to have newly blown right pupil although otherwise her examination has been stable.  She does have some increased tachycardia with heart rates in the 130s  Exam: Current vital signs: BP 113/67   Pulse 88   Temp 99.5 F (37.5 C) (Axillary)   Resp (!) 25   Ht 5\' 2"  (1.575 m)   Wt 55.6 kg   LMP 01/11/2021 Comment: neg preg test  SpO2 98%   BMI 22.42 kg/m  Vital signs in last 24 hours: Temp:  [98.5 F (36.9 C)-101.8 F (38.8 C)] 99.5 F (37.5 C) (11/07 1635) Pulse Rate:  [82-129] 88 (11/07 2000) Resp:  [15-31] 25 (11/07 2000) BP: (92-157)/(44-112) 113/67 (11/07 2000) SpO2:  [85 %-100 %] 98 % (11/07 2000) FiO2 (%):  [30 %] 30 % (11/07 1513) Weight:  [55.6 kg] 55.6 kg (11/07 0500)   Gen: Critically ill appearing female. Intubated, not on sedation  Resp: respirations assisted via mechanical ventilator, patient with tachypnea on pressure support with respiratory rate into the 30's during examination Abd: soft, non-distended  Neuro: Mental Status: Intubated, not on sedation in the ICU.  Blinks frequently, and does open eyes at times but does not track She does not follow commands She is having prominent dyskinetic movements of the face and all 4 extremities, asynchronous, intermittent, nonrhythmic Cranial Nerves: Right pupil is fixed and dilated at 6 mm, left pupil alternatively demonstrates hippus versus brisk reaction to light.  Eyes dysconjugate with VOR intact,  face appears symmetric within limitation of evaluation from ETT and securement device, unable to assess hearing.  Intact cough and gag Sensory/motor: She moves all 4 extremities grossly equally.  She does seem to localize towards the tube with the left upper extremity in particular, possibly slightly less briskly with the right upper extremity.  DTR: 3+ and symmetric in the brachioradialis.  Downgoing toes bilaterally Gait: Deferred for patient safety  Pertinent Labs: CBC    Component Value Date/Time   WBC 9.5 03/01/2021 0414   RBC 2.49 (L) 03/01/2021 0414   HGB 7.4 (L) 03/01/2021 0414   HCT 24.1 (L) 03/01/2021 0414   PLT 300 03/01/2021 0414   MCV 96.8 03/01/2021 0414   MCH 29.7 03/01/2021 0414   MCHC 30.7 03/01/2021 0414   RDW 14.6 03/01/2021 0414   LYMPHSABS 0.7 02/21/2021 0700   MONOABS 0.6 02/21/2021 0700   EOSABS 0.1 02/21/2021 0700   BASOSABS 0.0 02/21/2021 0700   CMP     Component Value Date/Time   NA 142 03/01/2021 1609   K 3.7 03/01/2021 1609   CL 107 03/01/2021 1609   CO2 29 03/01/2021 1609   GLUCOSE 105 (H) 03/01/2021 1609   BUN 8 03/01/2021 1609   CREATININE 0.36 (L) 03/01/2021 1609   CALCIUM 8.7 (L) 03/01/2021 1609   PROT 5.2 (L) 03/01/2021 0414   ALBUMIN 3.1 (L) 03/01/2021 0414   ALBUMIN 4.5 02/08/2021 1531   AST 19 03/01/2021 0414   ALT 11 03/01/2021 0414   ALKPHOS 27 (L) 03/01/2021 0414  BILITOT 0.3 03/01/2021 0414   GFRNONAA >60 03/01/2021 1609   10/17 CSF EBV positive  10/17 NMDA IgG on CSF > 1:2560 10/20 NMDA IgG serum: positive 1:1000  Imaging Reviewed:  MRI brain 10/31: Normal brain MRI.  MRI brain with and without contrast 03/01/2021 personally reviewed, agree with radiology No acute intracranial process. No etiology is seen for the patient's blown right pupil.   cEEG 02/25/2021 - 02/26/2021: "This study is suggestive of moderate to severe diffuse encephalopathy, nonspecific etiology but likely secondary to NMDA encephalitis as well as  sedation. No definite seizures or epileptiform discharges were seen during this study."  Full EEG read from today pending, but preliminary read with slowing and no seizures  Assessment: 20 y.o. 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 intake for 4 days and decreased verbalization 10/15 prior to arrival. Initial concerns for Hashimoto's encephalitis but extensive workup revealed CSF pleocytosis (273, mononuclear), positive NMDA receptor IgG, elevated IgG index and 3 oligoclonal bands in the CSF that are not noted in the serum. R frontal seizure on cEEG on 10/25, has not had any since she has been on Keppra. Her presentation is c/w NMDA receptor encephalitis. Viral encephalitis felt unlikely given no fever, viral PCR studies negative (EBV felt to be low-level positive with viral load less than 35 copies consistent with bystander virus) and NMDAR encephalitis felt more likely. She completed 5 days of high dose IV Solumedrol and completed IVIG x 5 doses. She completed 4 of 5 planned Plex sessions, however Plex was delayed today due to concern for examination change with newly blown right pupil.    On evaluation, her examination is largely unchanged from her admission examination other than the pupillary change.  This would localize to the [employer] nucleus and is concerning of further involvement of her encephalitis on brainstem function.  I do not think she has had a large stroke given she is otherwise moving all extremities equally and is reactive to stimulation.  EEG and reactivity on her examination are not consistent with ongoing seizure activity.  Given her repeat MRI brain does not show substantial acute intracranial process, I do think it is reasonable to continue planned Plex tomorrow, however will additionally add some further work-up for recurrent fevers including chest x-ray and UA.  Additionally has had concerns  for ST elevation on EKG-cardiology following with concern for acute pericarditis    Impression:  NMDA encephalitis Acute encephalopathy, due to NMDA encephalitis and sedation and seizures New onset seizures Acute respiratory failure Dyskinesias  Recommendations: - Discontinued LTM again today to repeat MRI brain with and without contrast to rule out acute brainstem stroke - Dyskinesias: Continue Klonopin to 0.5 qAM and 1mg  qHS  for dyskinesias (reduced from 1 mg twice daily on 11/6).  Also on tramadol 50 mg every 6 hours based on literature search by PCCM for improvement of dyskinesias; she appears to be tolerating this from a seizure perspective though would have low threshold to discontinue this if she has further seizure activity.  Continue fentanyl as needed - Continue Depakote 500 mg q8h and Keppra 1,500 mg BID - Continue seizure precautions - PLEX 5/5 needs to be completed pending MRI brain - Rituxan after PLEX is completed, will need to be ordered - Chest x-ray and UA for further work-up of recurrent fever, additional work-up per CCM - Neurology will continue to follow  Discussed the plan in detail with the  mother at bedside Discussed plan in detail with Dr. Tacy Learn on the unit Please feel free to call with any questions.  -- Lesleigh Noe MD-PhD [facility] [phone]  Available 7 AM to 7 PM, outside these hours please contact Neurologist on call listed on [place] Performed by: Lesleigh Noe Total critical care time: 60 minutes Critical care time was exclusive of separately billable procedures and treating other patients and/or supervising APPs/Residents/Students Critical care was necessary to treat or prevent imminent or life-threatening deterioration due to NMDAR encephalitis This patient is critically ill and at significant risk for neurological worsening and/or death and care requires constant monitoring. Critical care was time  spent personally by me on the following activities: development of treatment plan with patient and/or surrogate as well as nursing, discussions with consultants, evaluation of patient's response to treatment, examination of patient, obtaining history from patient or surrogate, ordering and performing treatments and interventions, ordering and review of laboratory studies, ordering and review of radiographic studies, pulse oximetry, re-evaluation of patient's condition, participation in multidisciplinary rounds and medical decision making of high complexity in the care of this patient.

## 2021-03-01 NOTE — Progress Notes (Signed)
LTM EEG hooked up and running - no initial skin breakdown - push button tested - neuro notified. [facility] monitoring.  

## 2021-03-01 NOTE — Progress Notes (Signed)
[facility] ADULT ICU REPLACEMENT PROTOCOL   The patient does apply for the [facility] Adult ICU Electrolyte Replacment Protocol based on the criteria listed below:   1.Exclusion criteria: TCTS patients, ECMO patients, and Dialysis patients 2. Is GFR >/= 30 ml/min? Yes.    Patient's GFR today is >60 3. Is SCr </= 2? Yes.   Patient's SCr is 0.37 mg/dL 4. Did SCr increase >/= 0.5 in 24 hours? No. 5.Pt's weight >40kg  Yes.   6. Abnormal electrolyte(s): K+ 3.6  7. Electrolytes replaced per protocol 8.  Call MD STAT for K+ </= 2.5, Phos </= 1, or Mag </= 1 Physician:  Gavin Potters, Elfredia Nevins 03/01/2021 6:07 AM

## 2021-03-02 ENCOUNTER — Inpatient Hospital Stay (HOSPITAL_COMMUNITY): Payer: Medicaid Other

## 2021-03-02 DIAGNOSIS — J15211 Pneumonia due to Methicillin susceptible Staphylococcus aureus: Secondary | ICD-10-CM | POA: Diagnosis not present

## 2021-03-02 DIAGNOSIS — G0490 Encephalitis and encephalomyelitis, unspecified: Secondary | ICD-10-CM | POA: Diagnosis not present

## 2021-03-02 DIAGNOSIS — J9601 Acute respiratory failure with hypoxia: Secondary | ICD-10-CM | POA: Diagnosis not present

## 2021-03-02 DIAGNOSIS — E039 Hypothyroidism, unspecified: Secondary | ICD-10-CM | POA: Diagnosis not present

## 2021-03-02 LAB — CBC
HCT: 22.8 % — ABNORMAL LOW (ref 36.0–46.0)
Hemoglobin: 7 g/dL — ABNORMAL LOW (ref 12.0–15.0)
MCH: 29.9 pg (ref 26.0–34.0)
MCHC: 30.7 g/dL (ref 30.0–36.0)
MCV: 97.4 fL (ref 80.0–100.0)
Platelets: 335 10*3/uL (ref 150–400)
RBC: 2.34 MIL/uL — ABNORMAL LOW (ref 3.87–5.11)
RDW: 14.7 % (ref 11.5–15.5)
WBC: 8.9 10*3/uL (ref 4.0–10.5)
nRBC: 0 % (ref 0.0–0.2)

## 2021-03-02 LAB — HEPATIC FUNCTION PANEL
ALT: 13 U/L (ref 0–44)
AST: 24 U/L (ref 15–41)
Albumin: 2.9 g/dL — ABNORMAL LOW (ref 3.5–5.0)
Alkaline Phosphatase: 26 U/L — ABNORMAL LOW (ref 38–126)
Bilirubin, Direct: <0.1 mg/dL (ref 0.0–0.2)
Total Bilirubin: 0.6 mg/dL (ref 0.3–1.2)
Total Protein: 5.2 g/dL — ABNORMAL LOW (ref 6.5–8.1)

## 2021-03-02 LAB — BASIC METABOLIC PANEL
Anion gap: 7 (ref 5–15)
BUN: 12 mg/dL (ref 6–20)
CO2: 24 mmol/L (ref 22–32)
Calcium: 8.3 mg/dL — ABNORMAL LOW (ref 8.9–10.3)
Chloride: 110 mmol/L (ref 98–111)
Creatinine, Ser: 0.42 mg/dL — ABNORMAL LOW (ref 0.44–1.00)
GFR, Estimated: >60 mL/min (ref >60)
Glucose, Bld: 87 mg/dL (ref 70–99)
Potassium: 2.8 mmol/L — ABNORMAL LOW (ref 3.5–5.1)
Sodium: 141 mmol/L (ref 135–145)

## 2021-03-02 LAB — PREPARE RBC (CROSSMATCH)

## 2021-03-02 LAB — HEMOGLOBIN AND HEMATOCRIT, BLOOD
HCT: 32.3 % — ABNORMAL LOW (ref 36.0–46.0)
Hemoglobin: 10.1 g/dL — ABNORMAL LOW (ref 12.0–15.0)

## 2021-03-02 LAB — GLUCOSE, CAPILLARY
Glucose-Capillary: 94 mg/dL (ref 70–99)
Glucose-Capillary: 76 mg/dL (ref 70–99)
Glucose-Capillary: 90 mg/dL (ref 70–99)
Glucose-Capillary: 108 mg/dL — ABNORMAL HIGH (ref 70–99)
Glucose-Capillary: 107 mg/dL — ABNORMAL HIGH (ref 70–99)
Glucose-Capillary: 123 mg/dL — ABNORMAL HIGH (ref 70–99)

## 2021-03-02 LAB — MAGNESIUM: Magnesium: 2 mg/dL (ref 1.7–2.4)

## 2021-03-02 LAB — VALPROIC ACID LEVEL: Valproic Acid Lvl: 59 ug/mL (ref 50.0–100.0)

## 2021-03-02 MED ORDER — POTASSIUM CHLORIDE 20 MEQ PO PACK
40.0000 meq | PACK | Freq: Three times a day (TID) | ORAL | Status: AC
  Administered 2021-03-02 – 2021-03-03 (×4): 40 meq
  Filled 2021-03-02 (×4): qty 2

## 2021-03-02 MED ORDER — POTASSIUM CHLORIDE 10 MEQ/50ML IV SOLN
10.0000 meq | INTRAVENOUS | Status: AC
  Administered 2021-03-02 (×4): 10 meq via INTRAVENOUS
  Filled 2021-03-02 (×4): qty 50

## 2021-03-02 MED ORDER — EPINEPHRINE PF 1 MG/ML IJ SOLN: 0.3000 mg | INTRAMUSCULAR | Status: DC | PRN

## 2021-03-02 MED ORDER — DIPHENHYDRAMINE HCL 12.5 MG/5ML PO ELIX
25.0000 mg | ORAL_SOLUTION | Freq: Four times a day (QID) | ORAL | Status: DC | PRN
  Filled 2021-03-02: qty 10

## 2021-03-02 MED ORDER — SODIUM CHLORIDE 0.9% IV SOLUTION: Freq: Once | INTRAVENOUS | Status: DC

## 2021-03-02 MED ORDER — METHYLPREDNISOLONE SODIUM SUCC 125 MG IJ SOLR
125.0000 mg | Freq: Once | INTRAMUSCULAR | Status: DC | PRN
  Filled 2021-03-02: qty 2

## 2021-03-02 MED ORDER — ALBUTEROL SULFATE (2.5 MG/3ML) 0.083% IN NEBU
2.5000 mg | INHALATION_SOLUTION | Freq: Once | RESPIRATORY_TRACT | Status: DC | PRN
  Filled 2021-03-02: qty 3

## 2021-03-02 MED ORDER — HEPARIN SODIUM (PORCINE) 1000 UNIT/ML IJ SOLN
1000.0000 [IU] | Freq: Once | INTRAMUSCULAR | Status: DC
  Filled 2021-03-02 (×2): qty 1

## 2021-03-02 MED ORDER — FAMOTIDINE IN NACL 20-0.9 MG/50ML-% IV SOLN
20.0000 mg | Freq: Once | INTRAVENOUS | Status: DC | PRN
  Filled 2021-03-02: qty 50

## 2021-03-02 MED ORDER — SODIUM CHLORIDE 0.9 % IV BOLUS: 1000.0000 mL | Freq: Once | INTRAVENOUS | Status: DC | PRN

## 2021-03-02 MED ORDER — SODIUM CHLORIDE 0.9 % IV SOLN
INTRAVENOUS | Status: AC
  Filled 2021-03-02 (×3): qty 200

## 2021-03-02 MED ORDER — POTASSIUM CHLORIDE 20 MEQ PO PACK
40.0000 meq | PACK | Freq: Once | ORAL | Status: AC
  Administered 2021-03-02: 40 meq
  Filled 2021-03-02: qty 2

## 2021-03-02 MED ORDER — DIPHENHYDRAMINE HCL 25 MG PO CAPS: 50.0000 mg | ORAL_CAPSULE | Freq: Once | ORAL | Status: DC

## 2021-03-02 MED ORDER — ACD FORMULA A 0.73-2.45-2.2 GM/100ML VI SOLN
1000.0000 mL | Status: DC
  Administered 2021-03-02: 1000 mL

## 2021-03-02 MED ORDER — ACETAMINOPHEN 325 MG PO TABS: 650.0000 mg | ORAL_TABLET | ORAL | Status: DC | PRN

## 2021-03-02 MED ORDER — ACETAMINOPHEN 325 MG PO TABS
650.0000 mg | ORAL_TABLET | Freq: Once | ORAL | Status: DC
  Filled 2021-03-02: qty 2

## 2021-03-02 MED ORDER — CALCIUM GLUCONATE-NACL 2-0.675 GM/100ML-% IV SOLN
2.0000 g | Freq: Once | INTRAVENOUS | Status: AC
  Administered 2021-03-02: 2000 mg via INTRAVENOUS

## 2021-03-02 MED ORDER — IOHEXOL 350 MG/ML SOLN
65.0000 mL | Freq: Once | INTRAVENOUS | Status: AC | PRN
  Administered 2021-03-02: 65 mL via INTRAVENOUS

## 2021-03-02 MED ORDER — SODIUM CHLORIDE 0.9 % IV SOLN
1000.0000 mg | Freq: Once | INTRAVENOUS | Status: AC
  Administered 2021-03-03: 1000 mg via INTRAVENOUS
  Filled 2021-03-02: qty 100

## 2021-03-02 MED ORDER — CALCIUM CARBONATE ANTACID 500 MG PO CHEW
2.0000 | CHEWABLE_TABLET | ORAL | Status: AC
  Administered 2021-03-02 (×2): 400 mg
  Filled 2021-03-02 (×2): qty 2

## 2021-03-02 MED ORDER — DIPHENHYDRAMINE HCL 50 MG/ML IJ SOLN
50.0000 mg | Freq: Once | INTRAMUSCULAR | Status: DC | PRN
  Filled 2021-03-02: qty 1

## 2021-03-02 NOTE — Progress Notes (Signed)
Spoke with Dr. Hortense Ramal regarding MR-A. Dr. Hortense Ramal agreed to hold the MR-A until tomorrow due to the patient having just been trached.

## 2021-03-02 NOTE — Progress Notes (Addendum)
NAME:  Jamie [name], MRN:  [id number], DOB:  April 06, 2001, LOS: 86 ADMISSION DATE:  02/06/2021, CONSULTATION DATE:  10/28 REFERRING MD:  Raymondo Band, CHIEF COMPLAINT:  Seizure, acute metabolic encephalopathy   History of Present Illness:  20 y/o female admitted with seizure and change in mental status.  She was treated with steroids and IVIg but despite this her condition worsened.  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  After treatment her mental status worsened and she developed inability to protect her airway in the setting of tachypnea and tachycardia.  She required intubation on 10/28.   Pertinent  Medical History  Acquired autoimmune hypothyroidism  Significant Hospital Events: Including procedures, antibiotic start and stop dates in addition to other pertinent events   10/16 Admit 10/25 New onset seizure activity, PCCM consult 1026 pulmonary critical care signed off 02/19/2021 pulmonary critical care reconsulted for suspected respiratory distress.  intubated, continuous EEG started 10/30 starting plasmapheresis 10/31 MRI normal, EEG with GPD+ 11/1 RN reports pt seized while in MRI , additional agents added overnight.   11/5 completed 4th session of PLEX 11/7 due for 5/5 PLEX. Afternoon R blown pupil. No etiology on imaging.    Micro/labs: 10/16 influenza AB PCR negative, SARS-CoV-2 PCR negative 10/16 HIV antibody negative 10/16 urine culture multiple species suggest recollection 10/16 urine drug screen negative, alcohol less than 10 10/17 CSF lymphocytic pleocytosis, fungal culture negative, CSF culture negative 10/17 CSF HSV 1 DNA negative, HSV-2 DNA negative, VZV PCR CSF negative 10/17 CSF cryptococcal antigen negative, cryptococcal antigen titer not indicated 10/17 CSF EBV positive  10/17 CMV qualitative CSF negative 10/17 NMDA IgG on CSF > 1:2560 10/18 HIV RNA quant less than 20 10/20 NMDA IgG serum: positive 1:1000 10/22 RPR nonreactive 10/22  QuantiFERON gold intermediate 10/23 EBV DNA QuantiFERON by PCR positive, less than 35 10/25 hepatitis B surface antigen nonreactive, hepatitis B core total antibody positive, hepatitis B antibody positive, hepatitis B DNA not detected 10/25 hepatitis C antibody negative 10/26 blood culture no growth to date 11/2 Tracheal aspirate abundant MSSA   Imaging: 10/16 CT head negative 10/16 MRI brain incomplete study due to patient intolerance without evidence of acute intracranial abnormality 10/22 chest abdomen pelvis CT scan no acute intrathoracic abdominal or pelvic pathology, no evidence of malignancy 10/22 MRI brain normal MRI of the brain 10/26 pelvic ultrasound right ovary not visualized, normal appearance of left ovary and uterus, trace free fluid in pelvis 10/27 pelvic MRI: Normal pelvic MRI, symmetric normal ovaries, no adnexal masses 10/31 MRI > no acute infarct or hemorrhage. Normal MRI.  11/7 CT H no acute abnormality 11/7 MRI brain no acute process   Procedures: 10/28 endotracheal tube >  10/29 R IJ HD catheter >  11/1 RUE PICC >  Interim History / Subjective:  Blown pupil 11/7 afternoon. No etiology on imaging   PLEX deferred for workup   NAEO -- K is low and was replaced by Elink   Objective   Blood pressure 123/80, pulse 75, temperature 98.1 F (36.7 C), temperature source Axillary, resp. rate (!) 25, height 5\' 2"  (1.575 m), weight 56.2 kg, last menstrual period 01/11/2021, SpO2 100 %.    Vent Mode: PRVC FiO2 (%):  [30 %-40 %] 40 % Set Rate:  [15 bmp-25 bmp] 25 bmp Vt Set:  [400 mL] 400 mL PEEP:  [5 cmH20] 5 cmH20 Plateau Pressure:  [15 cmH20-17 cmH20] 15 cmH20   Intake/Output Summary (Last 24 hours) at 03/02/2021 8527 Last data filed at  03/02/2021 0900 Gross per 24 hour  Intake 3796.52 ml  Output 3500 ml  Net 296.52 ml   Filed Weights   02/26/21 0500 03/01/21 0500 03/02/21 0430  Weight: 58 kg 55.6 kg 56.2 kg   Examination: General: Critically ill  appearing wdwn young adult women intubated  Neuro: Spontaneous extremity movement. + cough/gag. R pupil is blown. L pupil 2mm reactive  HENT: NCAT pink mm ETT secure protruding tongue.  Lungs: Mechanically ventilated. CTAb. Symmetrical chest expansion  Cardiovascular: RRR s1s2 no rgm cap refill brisk  Abdomen: soft ndnt + bowel sounds  GU: external catheter  Extremities: no acute deformity no cyanosis or clubbing  Skin: c/d/w. Regular repeating hyperpigmentation, oval shaped, extending from above umbilicus down anterior sides of [facility] Problem list     Assessment & Plan:    Anti NMDA receptor encephalitis  - received IVIG and steroids with no clinical improvement - Discussed transfer to [facility] neuro-intensive care unit on 10/29, they recommend initiation of plasmapheresis followed by evaluation of clinical response, consideration of ECT.   -MR Pelvis negative for ovarian teratoma. - PLEX 10/30, 11/1, 11/3, 11/5 -- was due for final 11/7 [place]is[place]  -new 11/7 afternoon. without imaging findings revealing etiology, probably related to encephalitis  P -5/5 PLEX 11/8 (was deferred 11/7 due to acute pupil changes, neuro workup)  -rituximab 11/9 -BID clonazepam, tramadol for dyskinesia  -Cont Keppra and depakote -expect that response to therapies will take weeks  Seizure  -10/25. Was taken off cEEG 11/5-11/6, possible seizures 11/7 early AM per night nurse. Received 2mg  ativan x3  P -back on cEEG 11/7 -PRN ativan for sz >5 minutes  Acute respiratory failure with hypoxemia due to inability to protect airway  LLL MSSA HCAP  P -plan for trach 11/8 -wean vent as able  -VAP, PAD, Pulm hygiene -cont abx for 7d course cefazolin  Acute pericarditis with small pericardial effusion  Tachycardia  P -cont colchicine  -metop incr to TID   Hypokalemia P -monitor, replace as indicated   Anemia 11/8 Drop from 7.4 to 7, no overt bleeding. Likely an iatrogenic  component with fq labs, critical illness myelosuppression  P -trend CBC -hgb goal > 7 -will give 1 PRBC 11/8 as pt will undergo trach this afternoon   Hashimoto thyroiditis  -synthroid   Inadequate PO intake -cont EN as per RDN -will place order for cortrak  -probably will need a PEG    Best Practice (right click and "Reselect all SmartList Selections" daily)  Diet/type: tubefeeds DVT prophylaxis: LMWH GI prophylaxis: PPI Lines: Central line (PICC placed 11/1) Foley:  N/A Code Status:  full code Last date of multidisciplinary goals of care discussion -11/8   CRITICAL CARE Performed by: Cristal Generous  Total critical care time: 38 minutes  Critical care time was exclusive of separately billable procedures and treating other patients. Critical care was necessary to treat or prevent imminent or life-threatening deterioration.  Critical care was time spent personally by me on the following activities: development of treatment plan with patient and/or surrogate as well as nursing, discussions with consultants, evaluation of patient's response to treatment, examination of patient, obtaining history from patient or surrogate, ordering and performing treatments and interventions, ordering and review of laboratory studies, ordering and review of radiographic studies, pulse oximetry and re-evaluation of patient's condition.  Eliseo Gum MSN, AGACNP-BC [place] for pager  03/02/2021, 9:53 AM

## 2021-03-02 NOTE — Progress Notes (Signed)
[facility] ADULT ICU REPLACEMENT PROTOCOL   The patient does apply for the [facility] Adult ICU Electrolyte Replacment Protocol based on the criteria listed below:   1.Exclusion criteria: TCTS patients, ECMO patients, and Dialysis patients 2. Is GFR >/= 30 ml/min? Yes.    Patient's GFR today is >60 3. Is SCr </= 2? Yes.   Patient's SCr is 0.42 mg/dL 4. Did SCr increase >/= 0.5 in 24 hours? No. 5.Pt's weight >40kg  Yes.   6. Abnormal electrolyte(s): K+ 2.8  7. Electrolytes replaced per protocol 8.  Call MD STAT for K+ </= 2.5, Phos </= 1, or Mag </= 1 Physician:  n/a   Jamie [name] 03/02/2021 5:48 AM

## 2021-03-02 NOTE — Progress Notes (Signed)
Nutrition Follow-up  DOCUMENTATION CODES:   Not applicable  INTERVENTION:   Tube feeding via OG tube:  Vital 1.5 @ 55 ml/h  (1320 ml per day) Increase Prosource TF to 45 ml TID  Provides 2100 kcal, 122 gm protein, 1003 ml free water daily   NUTRITION DIAGNOSIS:   Inadequate oral intake related to acute illness (pt with AMS) as evidenced by meal completion < 25%. Ongoing.   GOAL:   Patient will meet greater than or equal to 90% of their needs Met with TF at goal   MONITOR:   Vent status, TF tolerance, Skin, Weight trends, Labs, I & O's  REASON FOR ASSESSMENT:   Consult Enteral/tube feeding initiation and management  ASSESSMENT:   20 y.o. female with a PMHx of Hashimoto thyroiditis who is admitted with acute encephalopathy and catatonia.  Pt discussed during ICU rounds and with RN.  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  Cardiology following for acute pericarditis   Last dose of PLEX ordered for 11/7 however pt developed a blown pupil and held. Plan for PLEX today and then start rituximab 11/9 Plan for trach today, will need a PEG but interim plan is for a cortrak tube  During admission pt has had very poor nutrition due to tube in and out, and ileus.   10/16 admitted 10/21 cortrak placed; tip gastric  10/25 seizures; cortrak out 10/26 cortrak placed; tip gastric 10/27 cortrak out  10/28 intubated; OG placed  10/30 PLEX 11/1 seizures; PLEX 11/3 PLEX; pt with emesis episode due to vagal response; KUB with colonic distention measuring up to 6.3 cm possible ileus; OG with tip in stomach  11/6 TF to 40 11/7 TF to goal 11/8 TF held for trach at 2 pm   Patient is currently intubated on ventilator support MV: 7.2 L/min Temp (24hrs), Avg:98.7 F (37.1 C), Min:97.5 F (36.4 C), Max:100.3 F (37.9 C)   Medications reviewed and include: colace, synthroid, reglan 10 mg every 8 hours (11/3), protonix, miralax, 40 mEq KCL TID,  senokot-s, thiamine  Albumin  Labs reviewed: K 2.8 CBG's: 76-90   UOP: 3200 ml Stool 300 ml   Current TF:  Vital 1.5 @ 55 ml/h with Prosource TF 45 ml daily Provides 2020 kcal, 100 gm protein  Diet Order:   Diet Order             Diet NPO time specified  Diet effective midnight                   EDUCATION NEEDS:   No education needs have been identified at this time  Skin:  Skin Assessment: Reviewed RN Assessment  Last BM:  300 ml  Height:   Ht Readings from Last 1 Encounters:  02/19/21 5' 2"  (1.575 m) (18 %, Z= -0.90)*   * Growth percentiles are based on CDC (Girls, 2-20 Years) data.    Weight:   Wt Readings from Last 1 Encounters:  03/02/21 56.2 kg (43 %, Z= -0.18)*   * Growth percentiles are based on CDC (Girls, 2-20 Years) data.    Ideal Body Weight:  50 kg  BMI:  Body mass index is 22.66 kg/m.  Estimated Nutritional Needs:   Kcal:  2000  Protein:  90-110 grams  Fluid:  >/= 1.5 L/day  Lockie Pares., RD, LDN, CNSC See AMiON for contact information

## 2021-03-02 NOTE — Consult Note (Signed)
Ref: Ok Edwards, MD   Subjective:  Normal sinus rhythm with HR in 70's tp 90's. Random movements of extremities. EEG showing diffuse encephalopathic delta brush  Objective:  Vital Signs in the last 24 hours: Temp:  [97.5 F (36.4 C)-100.3 F (37.9 C)] 97.6 F (36.4 C) (11/08 1201) Pulse Rate:  [75-129] 94 (11/08 1300) Cardiac Rhythm: Normal sinus rhythm (11/08 1135) Resp:  [16-26] 18 (11/08 1300) BP: (103-140)/(44-90) 103/72 (11/08 1300) SpO2:  [85 %-100 %] 100 % (11/08 1300) FiO2 (%):  [30 %-40 %] 40 % (11/08 1201) Weight:  [56.2 kg] 56.2 kg (11/08 0430)  Physical Exam: BP Readings from Last 1 Encounters:  03/02/21 103/72     Wt Readings from Last 1 Encounters:  03/02/21 56.2 kg (43 %, Z= -0.18)*   * Growth percentiles are based on CDC (Girls, 2-20 Years) data.    Weight change: 0.6 kg Body mass index is 22.66 kg/m. HEENT: [place]/AT, Eyes-Brown, Conjunctiva-Pink, Sclera-Non-icteric Neck: No JVD, No bruit, Trachea midline. Lungs:  Clear, Bilateral. Cardiac:  Regular rhythm, normal S1 and S2, no S3. II/VI systolic murmur. Abdomen:  Soft. Extremities:  No edema present. No cyanosis. No clubbing. CNS: AxOx0, Cranial nerves grossly intact, moves all 4 extremities.  Skin: Warm and dry.   Intake/Output from previous day: 11/07 0701 - 11/08 0700 In: 4057.8 [I.V.:2310.5; NG/GT:1197; IV Piggyback:550.3] Out: 3500 [Urine:3200; Stool:300]    Lab Results: BMET    Component Value Date/Time   NA 141 03/02/2021 0436   NA 142 03/01/2021 1609   NA 143 03/01/2021 0414   K 2.8 (L) 03/02/2021 0436   K 3.7 03/01/2021 1609   K 3.6 03/01/2021 0414   CL 110 03/02/2021 0436   CL 107 03/01/2021 1609   CL 108 03/01/2021 0414   CO2 24 03/02/2021 0436   CO2 29 03/01/2021 1609   CO2 29 03/01/2021 0414   GLUCOSE 87 03/02/2021 0436   GLUCOSE 105 (H) 03/01/2021 1609   GLUCOSE 119 (H) 03/01/2021 0414   BUN 12 03/02/2021 0436   BUN 8 03/01/2021 1609   BUN 8 03/01/2021 0414    CREATININE 0.42 (L) 03/02/2021 0436   CREATININE 0.36 (L) 03/01/2021 1609   CREATININE 0.37 (L) 03/01/2021 0414   CALCIUM 8.3 (L) 03/02/2021 0436   CALCIUM 8.7 (L) 03/01/2021 1609   CALCIUM 8.9 03/01/2021 0414   GFRNONAA >60 03/02/2021 0436   GFRNONAA >60 03/01/2021 1609   GFRNONAA >60 03/01/2021 0414   CBC    Component Value Date/Time   WBC 8.9 03/02/2021 0436   RBC 2.34 (L) 03/02/2021 0436   HGB 7.0 (L) 03/02/2021 0436   HCT 22.8 (L) 03/02/2021 0436   PLT 335 03/02/2021 0436   MCV 97.4 03/02/2021 0436   MCH 29.9 03/02/2021 0436   MCHC 30.7 03/02/2021 0436   RDW 14.7 03/02/2021 0436   LYMPHSABS 0.7 02/21/2021 0700   MONOABS 0.6 02/21/2021 0700   EOSABS 0.1 02/21/2021 0700   BASOSABS 0.0 02/21/2021 0700   HEPATIC Function Panel Recent Labs    02/28/21 0607 03/01/21 0414 03/02/21 0436  PROT 4.8* 5.2* 5.2*   HEMOGLOBIN A1C No components found for: HGA1C,  MPG CARDIAC ENZYMES Lab Results  Component Value Date   CKTOTAL 169 02/16/2021   BNP No results for input(s): PROBNP in the last 8760 hours. TSH Recent Labs    05/08/20 1625 07/07/20 1449 02/06/21 1747  TSH 18.75* 2.56 10.095*   CHOLESTEROL Recent Labs    02/09/21 0344  CHOL 108  Scheduled Meds:  sodium chloride   Intravenous Once   calcium carbonate  2 tablet Per Tube Q3H   chlorhexidine gluconate (MEDLINE KIT)  15 mL Mouth Rinse BID   Chlorhexidine Gluconate Cloth  6 each Topical Daily   clonazePAM  0.5 mg Per Tube q AM   clonazepam  1 mg Per Tube QHS   colchicine  0.6 mg Per Tube Daily   diphenhydrAMINE  50 mg Per Tube Once   docusate  100 mg Per Tube BID   enoxaparin (LOVENOX) injection  40 mg Subcutaneous Q24H   etomidate  40 mg Intravenous Once   feeding supplement (PROSource TF)  45 mL Per Tube TID   fentaNYL (SUBLIMAZE) injection  200 mcg Intravenous Once   heparin sodium (porcine)  1,000 Units Intracatheter Once   levothyroxine  100 mcg Per Tube Q0600   mouth rinse  15 mL Mouth  Rinse 10 times per day   metoCLOPramide (REGLAN) injection  10 mg Intravenous Q8H   metoprolol tartrate  25 mg Per Tube TID   midazolam  5 mg Intravenous Once   pantoprazole sodium  40 mg Per Tube QHS   polyethylene glycol  17 g Per Tube BID   potassium chloride  40 mEq Per Tube TID   propofol  500 mg Intravenous Once   scopolamine  1 patch Transdermal Q72H   senna-docusate  2 tablet Per NG tube BID   sodium chloride flush  10-40 mL Intracatheter Q12H   tenofovir  300 mg Per Tube Daily   thiamine  100 mg Per Tube Daily   traMADol  50 mg Per Tube Q6H   valproic acid  500 mg Per Tube Q8H   vecuronium  10 mg Intravenous Once   Continuous Infusions:  sodium chloride 10 mL/hr at 03/02/21 1300   calcium gluconate 50 mL/hr at 03/02/21 1300    ceFAZolin (ANCEF) IV Stopped (03/02/21 0547)   citrate dextrose     famotidine (PEPCID) IV     feeding supplement (VITAL 1.5 CAL) 55 mL/hr at 03/01/21 2300   levETIRAcetam Stopped (03/02/21 0937)   riTUXimab (RITUXAN)  NON-ONCOLOGY  infusion Stopped (03/02/21 1119)   sodium chloride     PRN Meds:.acetaminophen, albuterol, atropine, diphenhydrAMINE, diphenhydrAMINE, EPINEPHrine, famotidine (PEPCID) IV, fentaNYL (SUBLIMAZE) injection, LORazepam, methylPREDNISolone (SOLU-MEDROL) injection, sodium chloride, sodium chloride flush  Assessment/Plan:  Acute NMDAR encephalitis Acute respiratory failure with hypoxia Acute pericarditis with small pericardial effusion Seizure disorder Sinus tachycardia controlled  Plan: Continue medical treatment   LOS: 23 days   Time spent including chart review, lab review, examination, discussion with patient/Mom/Nurse : 30 min   Dixie Dials  MD  03/02/2021, 1:19 PM

## 2021-03-02 NOTE — Progress Notes (Addendum)
Subjective: No acute events overnight.  Continues to have right eye mydriasis.  ROS: Unable to obtain due to poor mental status  Examination  Vital signs in last 24 hours: Temp:  [97.6 F (36.4 C)-100.3 F (37.9 C)] 98.1 F (36.7 C) (11/08 0800) Pulse Rate:  [75-129] 88 (11/08 1000) Resp:  [16-26] 18 (11/08 1000) BP: (112-140)/(44-90) 134/90 (11/08 1000) SpO2:  [85 %-100 %] 100 % (11/08 1000) FiO2 (%):  [30 %-40 %] 40 % (11/08 0800) Weight:  [56.2 kg] 56.2 kg (11/08 0430)  General: lying in bed, NAD CVS: pulse-normal rate and rhythm RS: breathing comfortably, coarse breath sounds bilaterally Extremities: normal, +edema  Neuro: winces but does not open eyes to noxious stimuli, right pupil dilated and not reacting to light, left pupil reacting to light but eye is down and out, no apparent facial asymmetry, withdraws to noxious stimuli in all 4 extremities with antigravity strength ( left>right)  Basic Metabolic Panel: Recent Labs  Lab 02/26/21 0422 02/26/21 1149 02/27/21 0325 02/27/21 1200 02/27/21 1400 02/28/21 0607 03/01/21 0414 03/01/21 1609 03/02/21 0436  NA 136  --  141   < > 143 140 143 142 141  K 3.5  --  2.8*   < > 4.4 3.3* 3.6 3.7 2.8*  CL 106  --  109   < > 114* 107 108 107 110  CO2 21*  --  25   < > 25 28 29 29 24   GLUCOSE 85  --  104*   < > 113* 115* 119* 105* 87  BUN 7  --  11   < > 9 9 8 8 12   CREATININE 0.34*  --  0.36*   < > <0.30* 0.30* 0.37* 0.36* 0.42*  CALCIUM 7.7*  --  7.5*   < > 8.3* 8.6* 8.9 8.7* 8.3*  MG 1.8  --  1.9  --   --  2.1 2.1  --  2.0  PHOS  --  2.3* 2.3*  --  2.9 2.2*  --   --   --    < > = values in this interval not displayed.    CBC: Recent Labs  Lab 02/26/21 0422 02/27/21 0325 02/28/21 0607 03/01/21 0414 03/02/21 0436  WBC 13.5* 7.6 7.1 9.5 8.9  HGB 8.1* 7.0* 7.5* 7.4* 7.0*  HCT 25.8* 22.0* 23.4* 24.1* 22.8*  MCV 95.9 95.7 95.5 96.8 97.4  PLT 185 150 185 300 335     Coagulation Studies: No results for input(s):  LABPROT, INR in the last 72 hours.  Imaging CT head without contrast 03/02/2019: No acute abnormality MRI brain with and without contrast 03/02/2019: No acute abnormality.  ASSESSMENT AND PLAN: 20 y.o. 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 intake for 4 days and decreased verbalization 10/15 prior to arrival. Initial concerns for Hashimoto's encephalitis but extensive workup revealed CSF pleocytosis (273, mononuclear), positive NMDA receptor IgG, elevated IgG index and 3 oligoclonal bands in the CSF that are not noted in the serum. R frontal seizure on cEEG on 10/25, has not had any since she has been on Keppra. 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 She completed 5 days of high dose IV Solumedrol and completed IVIG x 5 doses.  Now status post 5 dose of Plex on 03/02/2021   NMDA encephalitis Acute encephalopathy, due  to NMDA encephalitis and seizures New onset seizures Acute respiratory failure Dyskinesias Right eye mydriasis -No further clinical seizures overnight  Recommendation -Continue Clonazepam 0.5 mg qam and 1mg  qhs, Depakote 500 mg every 8 hours and Keppra 1500 mg twice daily - will receive 5/5 PLEX today.   -Plan for rituximab tomorrow -We will obtain CTA head to evaluate for mydriasis -Avoid using Ativan for dyskinesias at this would likely contribute to further sedation -Discussed plan in detail with patient's mother at bedside.  Also discussed with Dr. Tacy Learn    CRITICAL CARE Performed by: Lora Havens    Total critical care time: 40 minutes   Critical care time was exclusive of separately billable procedures and treating other patients.   Critical care was necessary to treat or prevent imminent or life-threatening deterioration.   Critical care  was time spent personally by me on the following activities: development of treatment plan with patient and/or surrogate as well as nursing, discussions with consultants, evaluation of patient's response to treatment, examination of patient, obtaining history from patient or surrogate, ordering and performing treatments and interventions, ordering and review of laboratory studies, ordering and review of radiographic studies, pulse oximetry and re-evaluation of patient's condition.  Zeb Comfort Epilepsy [facility] For questions after 5pm please refer to AMION to reach the Neurologist on call

## 2021-03-02 NOTE — Procedures (Signed)
Percutaneous Tracheostomy Procedure Note   Jamie [name]  [id number]  11/17/2000  Date:03/02/21  Time:2:49 PM   Provider Performing:Kennice Finnie  Procedure: Percutaneous Tracheostomy with Bronchoscopic Guidance (31600)  Indication(s) Acute hypoxic respiratory failure  Consent Risks of the procedure as well as the alternatives and risks of each were explained to the patient and/or caregiver.  Consent for the procedure was obtained.  Anesthesia Etomidate, Versed, Fentanyl, Vecuronium   Time Out Verified patient identification, verified procedure, site/side was marked, verified correct patient position, special equipment/implants available, medications/allergies/relevant history reviewed, required imaging and test results available.   Sterile Technique Maximal sterile technique including sterile barrier drape, hand hygiene, sterile gown, sterile gloves, mask, hair covering.    Procedure Description Appropriate anatomy identified by palpation.  Patient's neck prepped and draped in sterile fashion.  1% lidocaine with epinephrine was used to anesthetize skin overlying neck.  1.5cm incision made and blunt dissection performed until tracheal rings could be easily palpated.   Then a size 6 Shiley tracheostomy was placed under bronchoscopic visualization using usual Seldinger technique and serial dilation.   Bronchoscope confirmed placement above the carina.  Tracheostomy was sutured in place with adhesive pad to protect skin under pressure.    Patient connected to ventilator.   Complications/Tolerance None; patient tolerated the procedure well. Chest X-ray is ordered to confirm no post-procedural complication.   EBL Minimal   Specimen(s) None

## 2021-03-02 NOTE — Procedures (Signed)
Diagnostic Bronchoscopy  Bellamy Rubey  [id number]  Oct 06, 2000  Date:03/02/21  Time:2:28 PM   Provider Performing:Pete E Kary Kos   Procedure: Diagnostic Bronchoscopy (11216)  Indication(s) Assist with direct visualization of tracheostomy placement  Consent Risks of the procedure as well as the alternatives and risks of each were explained to the patient and/or caregiver.  Consent for the procedure was obtained.   Anesthesia See separate tracheostomy note   Time Out Verified patient identification, verified procedure, site/side was marked, verified correct patient position, special equipment/implants available, medications/allergies/relevant history reviewed, required imaging and test results available.   Sterile Technique Usual hand hygiene, masks, gowns, and gloves were used   Procedure Description Bronchoscope advanced through endotracheal tube and into airway.  After suctioning out tracheal secretions, bronchoscope used to provide direct visualization of tracheostomy placement.   Complications/Tolerance None; patient tolerated the procedure well.   EBL None  Specimen(s) None Erick Colace ACNP-BC [facility] Pager # [phone] OR # [phone] if no answer

## 2021-03-02 NOTE — Progress Notes (Addendum)
Pt transported from Rm 28 to CT and back to rm 28. No complications noted.

## 2021-03-02 NOTE — Procedures (Signed)
Patient Name: Jamie [name]  MRN: [id number]  Epilepsy Attending: Lora Havens  Referring Physician/Provider: Dr. Kathrynn Speed Duration: 03/01/2021 1003 to 1728  Patient history:  20 y.o. female with PMH significant for Hashimoto thyroiditis who presents with vomiting, headache and not eating for 4 days and not talking since today. EEG to evaluate for seizure   Level of alertness: lethargic   AEDs during EEG study: LEV, VPA, Clonazepam   Technical aspects: This EEG study was done with scalp electrodes positioned according to the 10-20 International system of electrode placement. Electrical activity was acquired at a sampling rate of 500Hz  and reviewed with a high frequency filter of 70Hz  and a low frequency filter of 1Hz . EEG data were recorded continuously and digitally stored.    Description: EEG showed continuous generalized high amplitude rhythmic 1 to 3 Hz delta slowing with overriding 15 to 18 Hz beta activity consistent with delta brush.    ABNORMALITY - Delta brush, generalized - Continuous slow, generalized   IMPRESSION: This study is suggestive of moderate to severe diffuse encephalopathy, nonspecific etiology but likely secondary to NMDA encephalitis. No definite seizures or epileptiform discharges were seen during this study.     Jamie [name]

## 2021-03-03 ENCOUNTER — Inpatient Hospital Stay (HOSPITAL_COMMUNITY): Payer: Medicaid Other

## 2021-03-03 DIAGNOSIS — E039 Hypothyroidism, unspecified: Secondary | ICD-10-CM | POA: Diagnosis not present

## 2021-03-03 DIAGNOSIS — Z930 Tracheostomy status: Secondary | ICD-10-CM

## 2021-03-03 DIAGNOSIS — G0490 Encephalitis and encephalomyelitis, unspecified: Secondary | ICD-10-CM | POA: Diagnosis not present

## 2021-03-03 DIAGNOSIS — J9601 Acute respiratory failure with hypoxia: Secondary | ICD-10-CM

## 2021-03-03 LAB — GLUCOSE, CAPILLARY
Glucose-Capillary: 129 mg/dL — ABNORMAL HIGH (ref 70–99)
Glucose-Capillary: 121 mg/dL — ABNORMAL HIGH (ref 70–99)
Glucose-Capillary: 121 mg/dL — ABNORMAL HIGH (ref 70–99)
Glucose-Capillary: 142 mg/dL — ABNORMAL HIGH (ref 70–99)
Glucose-Capillary: 150 mg/dL — ABNORMAL HIGH (ref 70–99)
Glucose-Capillary: 165 mg/dL — ABNORMAL HIGH (ref 70–99)

## 2021-03-03 LAB — TYPE AND SCREEN
ABO/RH(D): A POS
Antibody Screen: NEGATIVE
Unit division: 0

## 2021-03-03 LAB — BASIC METABOLIC PANEL
Anion gap: 10 (ref 5–15)
BUN: 12 mg/dL (ref 6–20)
CO2: 25 mmol/L (ref 22–32)
Calcium: 8.9 mg/dL (ref 8.9–10.3)
Chloride: 103 mmol/L (ref 98–111)
Creatinine, Ser: 0.41 mg/dL — ABNORMAL LOW (ref 0.44–1.00)
GFR, Estimated: >60 mL/min (ref >60)
Glucose, Bld: 114 mg/dL — ABNORMAL HIGH (ref 70–99)
Potassium: 3.8 mmol/L (ref 3.5–5.1)
Sodium: 138 mmol/L (ref 135–145)

## 2021-03-03 LAB — CBC WITH DIFFERENTIAL/PLATELET
Abs Immature Granulocytes: 0.9 10*3/uL — ABNORMAL HIGH (ref 0.00–0.07)
Basophils Absolute: 0 10*3/uL (ref 0.0–0.1)
Basophils Relative: 0 %
Blasts: 1 %
Eosinophils Absolute: 0.3 10*3/uL (ref 0.0–0.5)
Eosinophils Relative: 2 %
HCT: 31.8 % — ABNORMAL LOW (ref 36.0–46.0)
Hemoglobin: 10.3 g/dL — ABNORMAL LOW (ref 12.0–15.0)
Lymphocytes Relative: 10 %
Lymphs Abs: 1.3 10*3/uL (ref 0.7–4.0)
MCH: 29.5 pg (ref 26.0–34.0)
MCHC: 32.4 g/dL (ref 30.0–36.0)
MCV: 91.1 fL (ref 80.0–100.0)
Metamyelocytes Relative: 2 %
Monocytes Absolute: 0.5 10*3/uL (ref 0.1–1.0)
Monocytes Relative: 4 %
Myelocytes: 5 %
Neutro Abs: 10.2 10*3/uL — ABNORMAL HIGH (ref 1.7–7.7)
Neutrophils Relative %: 76 %
Platelets: 408 10*3/uL — ABNORMAL HIGH (ref 150–400)
RBC: 3.49 MIL/uL — ABNORMAL LOW (ref 3.87–5.11)
RDW: 19.8 % — ABNORMAL HIGH (ref 11.5–15.5)
WBC: 13.4 10*3/uL — ABNORMAL HIGH (ref 4.0–10.5)
nRBC: 0 % (ref 0.0–0.2)
nRBC: 0 /100 WBC

## 2021-03-03 LAB — PHOSPHORUS: Phosphorus: 3.3 mg/dL (ref 2.5–4.6)

## 2021-03-03 LAB — BPAM RBC
Blood Product Expiration Date: 202211212359
ISSUE DATE / TIME: 202211081135
Unit Type and Rh: 6200

## 2021-03-03 LAB — PATHOLOGIST SMEAR REVIEW

## 2021-03-03 LAB — MAGNESIUM: Magnesium: 2 mg/dL (ref 1.7–2.4)

## 2021-03-03 MED ORDER — DIPHENHYDRAMINE HCL 12.5 MG/5ML PO ELIX: 50.0000 mg | ORAL_SOLUTION | Freq: Once | ORAL | Status: DC

## 2021-03-03 MED ORDER — DIPHENHYDRAMINE HCL 12.5 MG/5ML PO ELIX
50.0000 mg | ORAL_SOLUTION | Freq: Once | ORAL | Status: AC
  Administered 2021-03-03: 50 mg
  Filled 2021-03-03: qty 20

## 2021-03-03 MED ORDER — ACETAMINOPHEN 325 MG PO TABS
650.0000 mg | ORAL_TABLET | Freq: Once | ORAL | Status: AC
  Administered 2021-03-03: 650 mg
  Filled 2021-03-03: qty 2

## 2021-03-03 MED ORDER — ACETAMINOPHEN 325 MG PO TABS
650.0000 mg | ORAL_TABLET | Freq: Four times a day (QID) | ORAL | Status: DC | PRN
  Administered 2021-03-03 – 2021-03-07 (×6): 650 mg
  Filled 2021-03-03 (×6): qty 2

## 2021-03-03 NOTE — Progress Notes (Signed)
NAME:  Jamie [name], MRN:  [id number], DOB:  May 29, 2000, LOS: 24 ADMISSION DATE:  02/06/2021, CONSULTATION DATE:  10/28 REFERRING MD:  Raymondo Band, CHIEF COMPLAINT:  Seizure, acute metabolic encephalopathy   History of Present Illness:  20 y/o female admitted with seizure and change in mental status.  She was treated with steroids and IVIg but despite this her condition worsened.  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  After treatment her mental status worsened and she developed inability to protect her airway in the setting of tachypnea and tachycardia.  She required intubation on 10/28.   Pertinent  Medical History  Acquired autoimmune hypothyroidism  Significant Hospital Events: Including procedures, antibiotic start and stop dates in addition to other pertinent events   10/16 Admit 10/25 New onset seizure activity, PCCM consult 1026 pulmonary critical care signed off 02/19/2021 pulmonary critical care reconsulted for suspected respiratory distress.  intubated, continuous EEG started 10/30 starting plasmapheresis 10/31 MRI normal, EEG with GPD+ 11/1 RN reports pt seized while in MRI , additional agents added overnight.   11/5 completed 4th session of PLEX 11/7 due for 5/5 PLEX. Afternoon R blown pupil. No etiology on imaging.  11/8 trach  Micro/labs: 10/16 influenza AB PCR negative, SARS-CoV-2 PCR negative 10/16 HIV antibody negative 10/16 urine culture multiple species suggest recollection 10/16 urine drug screen negative, alcohol less than 10 10/17 CSF lymphocytic pleocytosis, fungal culture negative, CSF culture negative 10/17 CSF HSV 1 DNA negative, HSV-2 DNA negative, VZV PCR CSF negative 10/17 CSF cryptococcal antigen negative, cryptococcal antigen titer not indicated 10/17 CSF EBV positive  10/17 CMV qualitative CSF negative 10/17 NMDA IgG on CSF > 1:2560 10/18 HIV RNA quant less than 20 10/20 NMDA IgG serum: positive 1:1000 10/22 RPR  nonreactive 10/22 QuantiFERON gold intermediate 10/23 EBV DNA QuantiFERON by PCR positive, less than 35 10/25 hepatitis B surface antigen nonreactive, hepatitis B core total antibody positive, hepatitis B antibody positive, hepatitis B DNA not detected 10/25 hepatitis C antibody negative 10/26 blood culture no growth to date 11/2 Tracheal aspirate abundant MSSA   Imaging: 10/16 CT head negative 10/16 MRI brain incomplete study due to patient intolerance without evidence of acute intracranial abnormality 10/22 chest abdomen pelvis CT scan no acute intrathoracic abdominal or pelvic pathology, no evidence of malignancy 10/22 MRI brain normal MRI of the brain 10/26 pelvic ultrasound right ovary not visualized, normal appearance of left ovary and uterus, trace free fluid in pelvis 10/27 pelvic MRI: Normal pelvic MRI, symmetric normal ovaries, no adnexal masses 10/31 MRI > no acute infarct or hemorrhage. Normal MRI.  11/7 CT H no acute abnormality 11/7 MRI brain no acute process   Procedures: 10/28 endotracheal tube >  10/29 R IJ HD catheter >  11/1 RUE PICC >  Interim History / Subjective:  Trach 11/8. Possible brief seizure with facial twitching this AM, received 2mg  Ativan with resolution. For Rituxan today.  Objective   Blood pressure (!) 128/99, pulse (!) 132, temperature 99.5 F (37.5 C), temperature source Axillary, resp. rate (!) 26, height 5\' 2"  (1.575 m), weight 50.9 kg, last menstrual period 01/11/2021, SpO2 99 %.    Vent Mode: PRVC FiO2 (%):  [30 %-40 %] 30 % Set Rate:  [18 bmp] 18 bmp Vt Set:  [400 mL] 400 mL PEEP:  [5 cmH20] 5 cmH20 Plateau Pressure:  [14 cmH20-20 cmH20] 18 cmH20   Intake/Output Summary (Last 24 hours) at 03/03/2021 5284 Last data filed at 03/03/2021 0700 Gross per 24 hour  Intake 1977.23 ml  Output 2150 ml  Net -172.77 ml    Filed Weights   03/01/21 0500 03/02/21 0430 03/03/21 0437  Weight: 55.6 kg 56.2 kg 50.9 kg   Examination: General:  Young adult female, resting in bed, in NAD. Neuro: Eyes closed, not responding (though just received Ativan for questionable seizure). HEENT: [place]/AT. R pupil > L (old per notes). Trach C/D/I. Cardiovascular: RRR, no M/R/G.  Lungs: Respirations even and unlabored.  CTA bilaterally, No W/R/R.  Abdomen: BS x 4, soft, NT/ND.  Musculoskeletal: No gross deformities, no edema.  Skin: Intact, warm, no rashes.   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 Discussed transfer to [facility] neuro-intensive care unit on 10/29, they recommend initiation of plasmapheresis followed by evaluation of clinical response, consideration of ECT.  She is now s/p PLEX x 5.  MRI pelvis neg for ovarian teratoma. Anisocoria with right eye mydriasis - new 11/7 afternoon, Without imaging findings revealing etiology, probably related to encephalitis  - Neuro following. - Rituxan planned for today. - BID clonazepam, tramadol for dyskinesia.  - Cont Keppra and depakote. - Continue supportive care, expect that response to therapies will take weeks.  Seizures - EEG 11/8 without definite seizures or epileptiform discharges. - Neuro following. - PRN ativan for sz >5 minutes  Acute respiratory failure with hypoxemia due to inability to protect airway - s/p trach 11/8 LLL MSSA HCAP  - Continue vent support. - Routine bronchial hygiene. - Wean as mental status allows.  - VAP, PAD, Pulm hygiene. - Cont Cefazolin for 7d course.   Acute pericarditis with small pericardial effusion.  Tachycardia . -cont colchicine.  -metop incr to TID.   Anemia. 11/8 Drop from 7.4 to 7, no overt bleeding. Likely an iatrogenic component with fq labs, critical illness myelosuppression  - Trend CBC. - Transfuse for Hgb < 7.  Hashimoto thyroiditis.  - Continue Synthroid.   Inadequate PO intake. - Continue EN as per RDN. - Probably will need a PEG.   Best Practice (right  click and "Reselect all SmartList Selections" daily)  Diet/type: tubefeeds DVT prophylaxis: LMWH GI prophylaxis: PPI Lines: Central line (PICC placed 11/1) Foley:  N/A Code Status:  full code Last date of multidisciplinary goals of care discussion -11/8   CC time: 30 min.   Montey Hora, [facility] For pager details, please see AMION or use Epic chat  After 1900, please call [facility] for cross coverage needs 03/03/2021, 9:54 AM

## 2021-03-03 NOTE — Procedures (Signed)
Cortrak  Tube Type:  Cortrak - 43 inches Tube Location:  Left nare Initial Placement:  Stomach Secured by: Bridle Technique Used to Measure Tube Placement:  Marking at nare/corner of mouth Cortrak Secured At:  70 cm  Cortrak Tube Team Note:  Consult received to place a Cortrak feeding tube.   X-ray is required, abdominal x-ray has been ordered by the Cortrak team. Please confirm tube placement before using the Cortrak tube.   If the tube becomes dislodged please keep the tube and contact the Cortrak team at [email or web address] ([email or web address]) for replacement.  If after hours and replacement cannot be delayed, place a NG tube and confirm placement with an abdominal x-ray.    Saniyyah Elster MS, RD, LDN Please refer to AMION for RD and/or RD on-call/weekend/after hours pager   

## 2021-03-03 NOTE — Progress Notes (Signed)
Subjective: Give IV Ativan for facial twitching this morning.  It has since been discontinued.  Mother at bedside.  ROS: Unable to obtain due to poor mental status  Examination  Vital signs in last 24 hours: Temp:  [96.4 F (35.8 C)-101.6 F (38.7 C)] 101.6 F (38.7 C) (11/09 1500) Pulse Rate:  [63-137] 137 (11/09 1500) Resp:  [18-31] 25 (11/09 1500) BP: (92-141)/(61-99) 123/73 (11/09 1500) SpO2:  [96 %-100 %] 100 % (11/09 1500) FiO2 (%):  [30 %-40 %] 30 % (11/09 1500) Weight:  [50.9 kg] 50.9 kg (11/09 0437)  General: lying in bed, NAD CVS: pulse-normal rate and rhythm RS: +trach, coarse breath sounds bilaterally Extremities: normal, +edema  Neuro: Opening eyes spontaneously and blinking but does not track examiner, right pupil dilated and not reacting to light, left pupil reacting to light, no apparent facial asymmetry, spontaneously moving bilateral upper extremities but does not know them to noxious stimulation.  Has intermittent left upper extremity nonrhythmic movements (likely dyskinesias).  Per mother, patient was also moving bilateral lower extremities earlier in the morning but did not withdraw to noxious stimuli for me  Basic Metabolic Panel: Recent Labs  Lab 02/26/21 1149 02/27/21 0325 02/27/21 1200 02/27/21 1400 02/28/21 0607 03/01/21 0414 03/01/21 1609 03/02/21 0436 03/03/21 0429  NA  --  141   < > 143 140 143 142 141 138  K  --  2.8*   < > 4.4 3.3* 3.6 3.7 2.8* 3.8  CL  --  109   < > 114* 107 108 107 110 103  CO2  --  25   < > 25 28 29 29 24 25   GLUCOSE  --  104*   < > 113* 115* 119* 105* 87 114*  BUN  --  11   < > 9 9 8 8 12 12   CREATININE  --  0.36*   < > <0.30* 0.30* 0.37* 0.36* 0.42* 0.41*  CALCIUM  --  7.5*   < > 8.3* 8.6* 8.9 8.7* 8.3* 8.9  MG  --  1.9  --   --  2.1 2.1  --  2.0 2.0  PHOS 2.3* 2.3*  --  2.9 2.2*  --   --   --  3.3   < > = values in this interval not displayed.    CBC: Recent Labs  Lab 02/27/21 0325 02/28/21 0607 03/01/21 0414  03/02/21 0436 03/02/21 1614 03/03/21 0429  WBC 7.6 7.1 9.5 8.9  --  13.4*  NEUTROABS  --   --   --   --   --  10.2*  HGB 7.0* 7.5* 7.4* 7.0* 10.1* 10.3*  HCT 22.0* 23.4* 24.1* 22.8* 32.3* 31.8*  MCV 95.7 95.5 96.8 97.4  --  91.1  PLT 150 185 300 335  --  408*     Coagulation Studies: No results for input(s): LABPROT, INR in the last 72 hours.  Imaging CTA head 03/02/2021: Normal CTA of the head. No large vessel occlusion or other acute abnormality. No findings to explain patient's symptoms. No aneurysm. No other acute intracranial abnormality.  ASSESSMENT AND PLAN: 20 y.o. 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 intake for 4 days and decreased verbalization 10/15 prior to arrival. Initial concerns for Hashimoto's encephalitis but extensive workup revealed CSF pleocytosis (273, mononuclear), positive NMDA receptor IgG, elevated IgG index and 3 oligoclonal bands in the CSF that are not noted in the serum. R  frontal seizure on cEEG on 10/25, has not had any since she has been on Keppra. 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 She completed 5 days of high dose IV Solumedrol and completed IVIG x 5 doses.  Now status post 5 dose of Plex on 03/02/2021   NMDA encephalitis Acute encephalopathy, due to NMDA encephalitis and seizures New onset seizures Acute respiratory failure Dyskinesias Right eye mydriasis -No further clinical seizures overnight  Recommendation -Continue Clonazepam 0.5 mg qam and 1mg  qhs, Depakote 500 mg every 8 hours and Keppra 1500 mg twice daily  -Plan for rituximab tomorrow -Avoid using Ativan for dyskinesias at this would likely contribute to further sedation -Discussed plan in detail with patient's mother at bedside and patient's aunt on phone  I  have spent a total of 35  minutes with the patient reviewing hospital notes,  test results, labs and examining the patient as well as establishing an assessment and plan.  > 50% of time was spent in direct patient care.  Zeb Comfort Epilepsy [facility] For questions after 5pm please refer to AMION to reach the Neurologist on call

## 2021-03-03 NOTE — Progress Notes (Signed)
[facility] Progress Note Patient Name: Jamie [name] DOB: March 01, 2001 MRN: [id number]   Date of Service  03/03/2021  HPI/Events of Note  Patient developed tachycardia and fever after Retuximab gtt was started, no new rash or hypotension, BP transiently increased but returned to normal, bedside RN stopped the infusion and will give a dose of Benadryl 12.5 mg iv per protocol. Patient was pre-medicated with Tylenol and Benadryl prior to starting infusion.  eICU Interventions  PRN Tylenol and Benadryl per protocol, close monitoring for an evolving rash or hemodynamic instability.        [name] Kona Yusuf 03/03/2021, 11:48 PM

## 2021-03-03 NOTE — Progress Notes (Signed)
PT Cancellation Note  Patient Details Name: Jamie [name] MRN: [id number] DOB: July 03, 2000   Cancelled Treatment:    Reason Eval/Treat Not Completed: Patient not medically ready per RN, patient unresponsive, R pupil blown, and recent seizure. PT will hold and follow up at later date.   Kalei Meda A. Gilford Rile PT, DPT Acute Rehabilitation Services Pager [phone] Office [phone]    [name] 03/03/2021, 8:57 AM

## 2021-03-03 NOTE — Progress Notes (Signed)
Rituximab initiation: Infusion started at 2006, teaching done with aunt at bedside, mother on telephone. Consent signed. Written titration and hypersensitivity instructions on infusion pump and discussed with ICU RN.

## 2021-03-03 NOTE — Progress Notes (Signed)
[facility] to be contacted for infusion per Lanice Schwab, RN, AD.

## 2021-03-03 NOTE — Progress Notes (Signed)
[place] Progress Note Patient Name: Jamie [name] DOB: [birth year] MRN: [id number]   Date of Service  03/03/2021  HPI/Events of Note  Patient had a brief spell of sinus tachycardia up to 120's but it has spontaneously resolved.  eICU Interventions  No intervention at this time.        [name] Veola Cafaro 03/03/2021, 6:47 AM

## 2021-03-03 NOTE — Progress Notes (Signed)
SLP Cancellation Note  Patient Details Name: Jamie [name] MRN: [id number] DOB: 2000/10/10   Cancelled treatment:        Pt not appropriate today for ST services. Will continue efforts.    [name] 03/03/2021, 2:56 PM  [name].Ed Risk analyst [phone] Office [phone]

## 2021-03-03 NOTE — Progress Notes (Signed)
Notified English as a second language teacher, chemo will be administered on night shift. Will notify of time so premeds can be administered beforehand. Fran Lowes RN VAST

## 2021-03-04 DIAGNOSIS — J9601 Acute respiratory failure with hypoxia: Secondary | ICD-10-CM | POA: Diagnosis not present

## 2021-03-04 DIAGNOSIS — G0490 Encephalitis and encephalomyelitis, unspecified: Secondary | ICD-10-CM | POA: Diagnosis not present

## 2021-03-04 DIAGNOSIS — E039 Hypothyroidism, unspecified: Secondary | ICD-10-CM | POA: Diagnosis not present

## 2021-03-04 LAB — BASIC METABOLIC PANEL
Anion gap: 8 (ref 5–15)
BUN: 9 mg/dL (ref 6–20)
CO2: 28 mmol/L (ref 22–32)
Calcium: 8.1 mg/dL — ABNORMAL LOW (ref 8.9–10.3)
Chloride: 102 mmol/L (ref 98–111)
Creatinine, Ser: 0.33 mg/dL — ABNORMAL LOW (ref 0.44–1.00)
GFR, Estimated: >60 mL/min (ref >60)
Glucose, Bld: 119 mg/dL — ABNORMAL HIGH (ref 70–99)
Potassium: 3.2 mmol/L — ABNORMAL LOW (ref 3.5–5.1)
Sodium: 138 mmol/L (ref 135–145)

## 2021-03-04 LAB — GLUCOSE, CAPILLARY
Glucose-Capillary: 111 mg/dL — ABNORMAL HIGH (ref 70–99)
Glucose-Capillary: 122 mg/dL — ABNORMAL HIGH (ref 70–99)
Glucose-Capillary: 124 mg/dL — ABNORMAL HIGH (ref 70–99)
Glucose-Capillary: 127 mg/dL — ABNORMAL HIGH (ref 70–99)
Glucose-Capillary: 139 mg/dL — ABNORMAL HIGH (ref 70–99)
Glucose-Capillary: 142 mg/dL — ABNORMAL HIGH (ref 70–99)

## 2021-03-04 LAB — MAGNESIUM: Magnesium: 2 mg/dL (ref 1.7–2.4)

## 2021-03-04 MED ORDER — POTASSIUM CHLORIDE 20 MEQ PO PACK
20.0000 meq | PACK | ORAL | Status: AC
  Administered 2021-03-04 (×2): 20 meq
  Filled 2021-03-04 (×2): qty 1

## 2021-03-04 MED ORDER — POTASSIUM CHLORIDE 10 MEQ/50ML IV SOLN
10.0000 meq | INTRAVENOUS | Status: AC
  Administered 2021-03-04 (×4): 10 meq via INTRAVENOUS
  Filled 2021-03-04 (×5): qty 50

## 2021-03-04 NOTE — Progress Notes (Signed)
NAME:  Jamie [name], MRN:  [id number], DOB:  01/07/2001, LOS: 80 ADMISSION DATE:  02/06/2021, CONSULTATION DATE:  10/28 REFERRING MD:  Raymondo Band, CHIEF COMPLAINT:  Seizure, acute metabolic encephalopathy   History of Present Illness:  20 y/o female admitted with seizure and change in mental status.  She was treated with steroids and IVIg but despite this her condition worsened.  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  After treatment her mental status worsened and she developed inability to protect her airway in the setting of tachypnea and tachycardia.  She required intubation on 10/28.   Pertinent  Medical History  Acquired autoimmune hypothyroidism  Significant Hospital Events: Including procedures, antibiotic start and stop dates in addition to other pertinent events   10/16 Admit 10/25 New onset seizure activity, PCCM consult 1026 pulmonary critical care signed off 02/19/2021 pulmonary critical care reconsulted for suspected respiratory distress.  intubated, continuous EEG started 10/30 starting plasmapheresis 10/31 MRI normal, EEG with GPD+ 11/1 RN reports pt seized while in MRI , additional agents added overnight.   11/5 completed 4th session of PLEX 11/7 due for 5/5 PLEX. Afternoon R blown pupil. No etiology on imaging.  11/8 trach  Micro/labs: 10/16 influenza AB PCR negative, SARS-CoV-2 PCR negative 10/16 HIV antibody negative 10/16 urine culture multiple species suggest recollection 10/16 urine drug screen negative, alcohol less than 10 10/17 CSF lymphocytic pleocytosis, fungal culture negative, CSF culture negative 10/17 CSF HSV 1 DNA negative, HSV-2 DNA negative, VZV PCR CSF negative 10/17 CSF cryptococcal antigen negative, cryptococcal antigen titer not indicated 10/17 CSF EBV positive  10/17 CMV qualitative CSF negative 10/17 NMDA IgG on CSF > 1:2560 10/18 HIV RNA quant less than 20 10/20 NMDA IgG serum: positive 1:1000 10/22 RPR  nonreactive 10/22 QuantiFERON gold intermediate 10/23 EBV DNA QuantiFERON by PCR positive, less than 35 10/25 hepatitis B surface antigen nonreactive, hepatitis B core total antibody positive, hepatitis B antibody positive, hepatitis B DNA not detected 10/25 hepatitis C antibody negative 10/26 blood culture no growth to date 11/2 Tracheal aspirate abundant MSSA   Imaging: 10/16 CT head negative 10/16 MRI brain incomplete study due to patient intolerance without evidence of acute intracranial abnormality 10/22 chest abdomen pelvis CT scan no acute intrathoracic abdominal or pelvic pathology, no evidence of malignancy 10/22 MRI brain normal MRI of the brain 10/26 pelvic ultrasound right ovary not visualized, normal appearance of left ovary and uterus, trace free fluid in pelvis 10/27 pelvic MRI: Normal pelvic MRI, symmetric normal ovaries, no adnexal masses 10/31 MRI > no acute infarct or hemorrhage. Normal MRI.  11/7 CT H no acute abnormality 11/7 MRI brain no acute process   Procedures: 10/28 endotracheal tube >  10/29 R IJ HD catheter >  11/1 RUE PICC >  Interim History / Subjective:  Patient remained responsive despite being off sedation Received Rituxan last night, developed tachycardia and hypotension, which improved with Benadryl  Objective   Blood pressure 114/63, pulse 86, temperature 98.2 F (36.8 C), temperature source Axillary, resp. rate 18, height 5\' 2"  (1.575 m), weight 55.9 kg, last menstrual period 01/11/2021, SpO2 100 %.    Vent Mode: PRVC FiO2 (%):  [30 %] 30 % Set Rate:  [18 bmp] 18 bmp Vt Set:  [400 mL] 400 mL PEEP:  [5 cmH20] 5 cmH20 Plateau Pressure:  [13 cmH20-18 cmH20] 13 cmH20   Intake/Output Summary (Last 24 hours) at 03/04/2021 1444 Last data filed at 03/04/2021 1400 Gross per 24 hour  Intake 1971.99 ml  Output 3300 ml  Net -1328.01 ml   Filed Weights   03/02/21 0430 03/03/21 0437 03/04/21 0500  Weight: 56.2 kg 50.9 kg 55.9 kg    Examination: General: Young adult female, s/p trach Neuro: Eyes closed, does not open, no response to painful stimuli.  Right pupil is dilated and fixed, left 5 mm, reactive to light.  Positive cough and gag HEENT: [place]/AT, Trach C/D/I. Cardiovascular: RRR, no M/R/G.  Lungs: Respirations even and unlabored.  CTA bilaterally, No W/R/R.  Abdomen: BS x 4, soft, NT/ND.  Musculoskeletal: No gross deformities, no edema.  Skin: Intact, warm, no rashes.   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  MRI pelvis neg for ovarian teratoma. Anisocoria with right eye mydriasis - new 11/7 afternoon, Without imaging findings revealing etiology, probably related to encephalitis  Appreciate neurology input Patient received Rituxan starting last night BID clonazepam, tramadol for dyskinesia.  Cont Keppra and depakote. Continuous EEG was discontinued  Acute respiratory failure with hypoxemia due to status epilepticus s/p trach 11/8 LLL MSSA HCAP  Continue lung protective ventilation continue vent support. Routine bronchial hygiene. Wean as mental status allows.  VAP, PAD, Pulm hygiene. Completed 7 days of IV cefazolin  Acute pericarditis with small pericardial effusion.  cont colchicine.   Anemia of critical illness S/p 1 unit PRBC Trend CBC.  Transfuse for Hgb < 7.  Hashimoto thyroiditis.  Continue Synthroid.   Hypokalemia Continue potassium supplements and monitor  Best Practice (right click and "Reselect all SmartList Selections" daily)  Diet/type: tubefeeds DVT prophylaxis: LMWH GI prophylaxis: PPI Lines: Central line (PICC placed 11/1) Foley:  N/A Code Status:  full code Last date of multidisciplinary goals of care discussion -11/10, patient's mother was updated at  bedside   Total critical care time: 37 minutes  Performed by: Jacky Kindle   Critical care time was exclusive of separately billable procedures and treating other patients.   Critical care was necessary to treat or prevent imminent or life-threatening deterioration.   Critical care was time spent personally by me on the following activities: development of treatment plan with patient and/or surrogate as well as nursing, discussions with consultants, evaluation of patient's response to treatment, examination of patient, obtaining history from patient or surrogate, ordering and performing treatments and interventions, ordering and review of laboratory studies, ordering and review of radiographic studies, pulse oximetry and re-evaluation of patient's condition.   Jacky Kindle MD [facility] See Amion for pager If no response to pager, please call [phone] until 7pm After 7pm, Please call E-link [phone]

## 2021-03-04 NOTE — Progress Notes (Signed)
PT Cancellation Note  Patient Details Name: Jamie [name] MRN: [id number] DOB: 10/23/2000   Cancelled Treatment:    Reason Eval/Treat Not Completed: Patient not medically ready Per RN, patient unresponsive and not medically appropriate. PT will re-attempt at later date.   Zehava Turski A. Gilford Rile PT, DPT Acute Rehabilitation Services Pager [phone] Office [phone]    [name] 03/04/2021, 11:01 AM

## 2021-03-04 NOTE — Progress Notes (Addendum)
   03/04/21 1345  Clinical Encounter Type  Visited With Patient and family together  Visit Type Follow-up  Referral From Chaplain  Consult/Referral To Chaplain   Chaplain Jorene Guest visited while rounding. The patient's mother, [place], was at the patient's bedside. The mother said the patient's life changed significantly after having cold symptoms. Chaplain Owens Shark encouraged storytelling to build a relationship with the mother. Ms. [place] said she moved here from [place]. She also shared pictures depicting different life stages of the patient. 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 Ms.Jordan explored hopes of the patient having a quality of life. Ms. [place] said the patient's aunts are a good support system. Ms. [place] acknowledged her faith in God and requested Christean Leaf to pray. The chaplain also gave the patient a prayer shawl which Ms. [place] expressed appreciation of its significance and how the symbol affirms her faith. Chaplain also provided social support by showing the mother how to use the TV and its controls. This note was prepared by Jeanine Luz, M.Div..  For questions please contact by phone [phone].

## 2021-03-04 NOTE — Progress Notes (Signed)
SLP Cancellation Note  Patient Details Name: Jamie [name] MRN: [id number] DOB: 2000-06-03   Cancelled treatment:        Orders for PMV. Pt can have inline vent valve however she needs to be able to provide feedback for respiratory status throughout and interact. Presently she will open eyes briefly but is overall not responsive for PMV inline valve assessment at present. Will continue to follow.    [name] 03/04/2021, 9:29 AM

## 2021-03-04 NOTE — Progress Notes (Signed)
Subjective: Received rituximab last night during which had tachycardia and fever which improved after Benadryl.  Had orofacial dyskinesias this morning which lasted for almost 30 minutes and stopped after fentanyl 50 mg.  ROS: Unable to obtain due to poor mental status  Examination  Vital signs in last 24 hours: Temp:  [97.4 F (36.3 C)-100.2 F (37.9 C)] 98.2 F (36.8 C) (11/10 1149) Pulse Rate:  [80-141] 86 (11/10 1400) Resp:  [18-39] 18 (11/10 1400) BP: (95-140)/(48-117) 114/63 (11/10 1400) SpO2:  [96 %-100 %] 100 % (11/10 1400) FiO2 (%):  [30 %] 30 % (11/10 1540) Weight:  [55.9 kg] 55.9 kg (11/10 0500)  General: lying in bed, NAD CVS: pulse-normal rate and rhythm RS: +trach, coarse breath sounds bilaterally Extremities: normal, +edema  Neuro: Does not open eyes to noxious stimuli but does appear to be blinking, right pupil dilated and not reacting to light, left pupil reacting to light, no apparent facial asymmetry, corneal reflex intact, intermittently moving bilateral upper extremities against gravity, 1/5 in left lower extremity, 0/5 in right lower extremity    Basic Metabolic Panel: Recent Labs  Lab 02/26/21 1149 02/27/21 0325 02/27/21 1200 02/27/21 1400 02/28/21 0607 03/01/21 0414 03/01/21 1609 03/02/21 0436 03/03/21 0429 03/04/21 0459  NA  --  141   < > 143 140 143 142 141 138 138  K  --  2.8*   < > 4.4 3.3* 3.6 3.7 2.8* 3.8 3.2*  CL  --  109   < > 114* 107 108 107 110 103 102  CO2  --  25   < > 25 28 29 29 24 25 28   GLUCOSE  --  104*   < > 113* 115* 119* 105* 87 114* 119*  BUN  --  11   < > 9 9 8 8 12 12 9   CREATININE  --  0.36*   < > <0.30* 0.30* 0.37* 0.36* 0.42* 0.41* 0.33*  CALCIUM  --  7.5*   < > 8.3* 8.6* 8.9 8.7* 8.3* 8.9 8.1*  MG  --  1.9  --   --  2.1 2.1  --  2.0 2.0 2.0  PHOS 2.3* 2.3*  --  2.9 2.2*  --   --   --  3.3  --    < > = values in this interval not displayed.    CBC: Recent Labs  Lab 02/27/21 0325 02/28/21 0607 03/01/21 0414  03/02/21 0436 03/02/21 1614 03/03/21 0429  WBC 7.6 7.1 9.5 8.9  --  13.4*  NEUTROABS  --   --   --   --   --  10.2*  HGB 7.0* 7.5* 7.4* 7.0* 10.1* 10.3*  HCT 22.0* 23.4* 24.1* 22.8* 32.3* 31.8*  MCV 95.7 95.5 96.8 97.4  --  91.1  PLT 150 185 300 335  --  408*     Coagulation Studies: No results for input(s): LABPROT, INR in the last 72 hours.  Imaging No new brain imaging overnight   ASSESSMENT AND PLAN: 20 y.o. 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 intake for 4 days and decreased verbalization 10/15 prior to arrival. Initial concerns for Hashimoto's encephalitis but extensive workup revealed CSF pleocytosis (273, mononuclear), positive NMDA receptor IgG, elevated IgG index and 3 oligoclonal bands in the CSF that are not noted in the serum. R frontal seizure on cEEG on 10/25, has not had any since she has been on Keppra. Her  presentation is most concerning for NMDA receptor encephalitis. 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 She completed 5 days of high dose IV Solumedrol and completed IVIG x 5 doses.  Now status post 5 dose of Plex on 03/02/2021   NMDA encephalitis Acute encephalopathy, due to NMDA encephalitis and seizures New onset seizures Acute respiratory failure Dyskinesias Right eye mydriasis -No clinical seizures.  Does have episodes of orofacial dyskinesias.   Recommendation -Continue Clonazepam 0.5 mg qam and 1mg  qhs, Depakote 500 mg every 8 hours and Keppra 1500 mg twice daily -Discussed with ICU team about stopping tramadol to minimize sedation -Avoid using Ativan for dyskinesias at this would likely contribute to further sedation -Discussed plan in detail with patient's mother at bedside and patient's aunt on phone   I have spent a total of 50 minutes with the patient reviewing hospital notes,  test results,  labs and examining the patient as well as establishing an assessment and plan.  > 50% of time was spent in direct patient care.   Zeb Comfort Epilepsy [facility] For questions after 5pm please refer to AMION to reach the Neurologist on call

## 2021-03-04 NOTE — Progress Notes (Signed)
Pt placed back on full vent support due to apnea. Pt tolerating well at this time. Vitals stable, RT will continue to monitor.

## 2021-03-04 NOTE — Progress Notes (Signed)
[name] ADULT ICU REPLACEMENT PROTOCOL   The patient does apply for the [facility] Adult ICU Electrolyte Replacment Protocol based on the criteria listed below:   1.Exclusion criteria: TCTS patients, ECMO patients, and Dialysis patients 2. Is GFR >/= 30 ml/min? Yes.    Patient's GFR today is >60   3. Is SCr </= 2? Yes.   Patient's SCr is 0.33 mg/dL 4. Did SCr increase >/= 0.5 in 24 hours? No. 5.Pt's weight >40kg  Yes.   6. Abnormal electrolyte(s): K+ 3.2  7. Electrolytes replaced per protocol 8.  Call MD STAT for K+ </= 2.5, Phos </= 1, or Mag </= 1 Physician:  n/a  Jamie [name] 03/04/2021 6:17 AM

## 2021-03-05 ENCOUNTER — Inpatient Hospital Stay (HOSPITAL_COMMUNITY): Payer: Medicaid Other

## 2021-03-05 DIAGNOSIS — G0490 Encephalitis and encephalomyelitis, unspecified: Secondary | ICD-10-CM | POA: Diagnosis not present

## 2021-03-05 LAB — GLUCOSE, CAPILLARY
Glucose-Capillary: 110 mg/dL — ABNORMAL HIGH (ref 70–99)
Glucose-Capillary: 119 mg/dL — ABNORMAL HIGH (ref 70–99)
Glucose-Capillary: 119 mg/dL — ABNORMAL HIGH (ref 70–99)
Glucose-Capillary: 125 mg/dL — ABNORMAL HIGH (ref 70–99)
Glucose-Capillary: 142 mg/dL — ABNORMAL HIGH (ref 70–99)
Glucose-Capillary: 98 mg/dL (ref 70–99)

## 2021-03-05 LAB — CBC WITH DIFFERENTIAL/PLATELET
Abs Immature Granulocytes: 1.29 10*3/uL — ABNORMAL HIGH (ref 0.00–0.07)
Basophils Absolute: 0.1 10*3/uL (ref 0.0–0.1)
Basophils Relative: 1 %
Eosinophils Absolute: 0.2 10*3/uL (ref 0.0–0.5)
Eosinophils Relative: 2 %
HCT: 32 % — ABNORMAL LOW (ref 36.0–46.0)
Hemoglobin: 10 g/dL — ABNORMAL LOW (ref 12.0–15.0)
Immature Granulocytes: 14 %
Lymphocytes Relative: 10 %
Lymphs Abs: 0.9 10*3/uL (ref 0.7–4.0)
MCH: 29.5 pg (ref 26.0–34.0)
MCHC: 31.3 g/dL (ref 30.0–36.0)
MCV: 94.4 fL (ref 80.0–100.0)
Monocytes Absolute: 0.9 10*3/uL (ref 0.1–1.0)
Monocytes Relative: 10 %
Neutro Abs: 5.6 10*3/uL (ref 1.7–7.7)
Neutrophils Relative %: 63 %
Platelets: 437 10*3/uL — ABNORMAL HIGH (ref 150–400)
RBC: 3.39 MIL/uL — ABNORMAL LOW (ref 3.87–5.11)
RDW: 18.6 % — ABNORMAL HIGH (ref 11.5–15.5)
WBC: 9.1 10*3/uL (ref 4.0–10.5)
nRBC: 0 % (ref 0.0–0.2)

## 2021-03-05 LAB — BASIC METABOLIC PANEL
Anion gap: 9 (ref 5–15)
BUN: 15 mg/dL (ref 6–20)
CO2: 28 mmol/L (ref 22–32)
Calcium: 9 mg/dL (ref 8.9–10.3)
Chloride: 102 mmol/L (ref 98–111)
Creatinine, Ser: <0.3 mg/dL — ABNORMAL LOW (ref 0.44–1.00)
Glucose, Bld: 138 mg/dL — ABNORMAL HIGH (ref 70–99)
Potassium: 3.2 mmol/L — ABNORMAL LOW (ref 3.5–5.1)
Sodium: 139 mmol/L (ref 135–145)

## 2021-03-05 LAB — ECHOCARDIOGRAM LIMITED
Height: 62 in
Weight: 1982.38 oz

## 2021-03-05 LAB — PHOSPHORUS: Phosphorus: 2.5 mg/dL (ref 2.5–4.6)

## 2021-03-05 LAB — MAGNESIUM: Magnesium: 2.2 mg/dL (ref 1.7–2.4)

## 2021-03-05 MED ORDER — LORAZEPAM 2 MG/ML IJ SOLN
2.0000 mg | Freq: Once | INTRAMUSCULAR | Status: AC
  Administered 2021-03-05: 2 mg via INTRAVENOUS
  Filled 2021-03-05: qty 1

## 2021-03-05 MED ORDER — LORAZEPAM 2 MG/ML IJ SOLN
INTRAMUSCULAR | Status: AC
  Filled 2021-03-05: qty 1

## 2021-03-05 MED ORDER — LORAZEPAM 2 MG/ML IJ SOLN
2.0000 mg | Freq: Once | INTRAMUSCULAR | Status: AC
  Administered 2021-03-05: 2 mg via INTRAVENOUS

## 2021-03-05 MED ORDER — CLONAZEPAM 0.25 MG PO TBDP
0.5000 mg | ORAL_TABLET | Freq: Three times a day (TID) | ORAL | Status: DC
  Administered 2021-03-05 – 2021-03-14 (×28): 0.5 mg
  Filled 2021-03-05 (×29): qty 2

## 2021-03-05 MED ORDER — CLONAZEPAM 0.25 MG PO TBDP
0.5000 mg | ORAL_TABLET | Freq: Four times a day (QID) | ORAL | Status: DC
Start: 2021-03-05 — End: 2021-03-05

## 2021-03-05 MED ORDER — POTASSIUM CHLORIDE 10 MEQ/50ML IV SOLN
10.0000 meq | INTRAVENOUS | Status: AC
  Administered 2021-03-05 (×4): 10 meq via INTRAVENOUS
  Filled 2021-03-05 (×3): qty 50

## 2021-03-05 MED ORDER — POTASSIUM CHLORIDE 20 MEQ PO PACK
20.0000 meq | PACK | ORAL | Status: AC
  Administered 2021-03-05 (×2): 20 meq
  Filled 2021-03-05 (×2): qty 1

## 2021-03-05 NOTE — Consult Note (Signed)
Ref: Ok Edwards, MD   Subjective:  Ventilator dependent with tracheostomy. Sedated. Monitor: Normal sinus rhythm. Echocardiogram, Limited shows normal LV systolic function with minimal posterior pericardial effusion, lot less than before.  Objective:  Vital Signs in the last 24 hours: Temp:  [97.8 F (36.6 C)-101.6 F (38.7 C)] 98 F (36.7 C) (11/11 1156) Pulse Rate:  [72-117] 86 (11/11 1100) Cardiac Rhythm: Normal sinus rhythm (11/11 0800) Resp:  [14-39] 18 (11/11 1100) BP: (98-140)/(25-117) 98/67 (11/11 1100) SpO2:  [95 %-100 %] 100 % (11/11 1100) FiO2 (%):  [30 %] 30 % (11/11 1151) Weight:  [56.2 kg] 56.2 kg (11/11 0411)  Physical Exam: BP Readings from Last 1 Encounters:  03/05/21 98/67     Wt Readings from Last 1 Encounters:  03/05/21 56.2 kg (43 %, Z= -0.18)*   * Growth percentiles are based on CDC (Girls, 2-20 Years) data.    Weight change: 0.3 kg Body mass index is 22.66 kg/m. HEENT: [place]/AT, Eyes-Brown, Conjunctiva-Pink, Sclera-Non-icteric Neck: No JVD, No bruit, Trachea midline. Lungs:  Clear, Bilateral. Cardiac:  Regular rhythm, normal S1 and S2, no S3. II/VI systolic murmur. Abdomen:  Soft. Extremities:  No edema present. No cyanosis. No clubbing. CNS: AxOx0.  Skin: Warm and dry.   Intake/Output from previous day: 11/10 0701 - 11/11 0700 In: 2192.2 [I.V.:377.9; NG/GT:1265; IV Piggyback:549.4] Out: 2900 [Urine:2800; Stool:100]    Lab Results: BMET    Component Value Date/Time   NA 139 03/05/2021 0403   NA 138 03/04/2021 0459   NA 138 03/03/2021 0429   K 3.2 (L) 03/05/2021 0403   K 3.2 (L) 03/04/2021 0459   K 3.8 03/03/2021 0429   CL 102 03/05/2021 0403   CL 102 03/04/2021 0459   CL 103 03/03/2021 0429   CO2 28 03/05/2021 0403   CO2 28 03/04/2021 0459   CO2 25 03/03/2021 0429   GLUCOSE 138 (H) 03/05/2021 0403   GLUCOSE 119 (H) 03/04/2021 0459   GLUCOSE 114 (H) 03/03/2021 0429   BUN 15 03/05/2021 0403   BUN 9 03/04/2021 0459   BUN 12  03/03/2021 0429   CREATININE <0.30 (L) 03/05/2021 0403   CREATININE 0.33 (L) 03/04/2021 0459   CREATININE 0.41 (L) 03/03/2021 0429   CALCIUM 9.0 03/05/2021 0403   CALCIUM 8.1 (L) 03/04/2021 0459   CALCIUM 8.9 03/03/2021 0429   GFRNONAA NOT CALCULATED 03/05/2021 0403   GFRNONAA >60 03/04/2021 0459   GFRNONAA >60 03/03/2021 0429   CBC    Component Value Date/Time   WBC 9.1 03/05/2021 0403   RBC 3.39 (L) 03/05/2021 0403   HGB 10.0 (L) 03/05/2021 0403   HCT 32.0 (L) 03/05/2021 0403   PLT 437 (H) 03/05/2021 0403   MCV 94.4 03/05/2021 0403   MCH 29.5 03/05/2021 0403   MCHC 31.3 03/05/2021 0403   RDW 18.6 (H) 03/05/2021 0403   LYMPHSABS 0.9 03/05/2021 0403   MONOABS 0.9 03/05/2021 0403   EOSABS 0.2 03/05/2021 0403   BASOSABS 0.1 03/05/2021 0403   HEPATIC Function Panel Recent Labs    02/28/21 0607 03/01/21 0414 03/02/21 0436  PROT 4.8* 5.2* 5.2*   HEMOGLOBIN A1C No components found for: HGA1C,  MPG CARDIAC ENZYMES Lab Results  Component Value Date   CKTOTAL 169 02/16/2021   BNP No results for input(s): PROBNP in the last 8760 hours. TSH Recent Labs    05/08/20 1625 07/07/20 1449 02/06/21 1747  TSH 18.75* 2.56 10.095*   CHOLESTEROL Recent Labs    02/09/21 0344  CHOL 108  Scheduled Meds:  sodium chloride   Intravenous Once   chlorhexidine gluconate (MEDLINE KIT)  15 mL Mouth Rinse BID   Chlorhexidine Gluconate Cloth  6 each Topical Daily   clonazePAM  0.5 mg Per Tube q AM   clonazepam  1 mg Per Tube QHS   colchicine  0.6 mg Per Tube Daily   docusate  100 mg Per Tube BID   enoxaparin (LOVENOX) injection  40 mg Subcutaneous Q24H   feeding supplement (PROSource TF)  45 mL Per Tube TID   heparin sodium (porcine)  1,000 Units Intracatheter Once   levothyroxine  100 mcg Per Tube Q0600   mouth rinse  15 mL Mouth Rinse 10 times per day   metoprolol tartrate  25 mg Per Tube TID   pantoprazole sodium  40 mg Per Tube QHS   polyethylene glycol  17 g Per Tube  BID   scopolamine  1 patch Transdermal Q72H   senna-docusate  2 tablet Per NG tube BID   sodium chloride flush  10-40 mL Intracatheter Q12H   tenofovir  300 mg Per Tube Daily   thiamine  100 mg Per Tube Daily   valproic acid  500 mg Per Tube Q8H   Continuous Infusions:  sodium chloride Stopped (03/05/21 1000)   citrate dextrose     famotidine (PEPCID) IV     feeding supplement (VITAL 1.5 CAL) 55 mL/hr at 03/05/21 0600   levETIRAcetam Stopped (03/05/21 0946)   sodium chloride     PRN Meds:.acetaminophen, albuterol, atropine, diphenhydrAMINE, diphenhydrAMINE, EPINEPHrine, famotidine (PEPCID) IV, fentaNYL (SUBLIMAZE) injection, methylPREDNISolone (SOLU-MEDROL) injection, sodium chloride, sodium chloride flush  Assessment/Plan:  Acute NMDAR encephalitis Acute respiratory failure with hypoxia Acute pericarditis with effusion Sinus tachycardia, improved Seizure disorder  Plan: Continue colchicine for 3 months. Repeat limited echo in 2-3 months.    LOS: 26 days   Time spent including chart review, lab review, examination, discussion with patient : 30 min   Dixie Dials  MD  03/05/2021, 12:12 PM

## 2021-03-05 NOTE — Progress Notes (Signed)
Pt placed back on full vent support due to low RR and apnea. Pt tolerating well, RN aware, RT will continue to monitor.

## 2021-03-05 NOTE — Progress Notes (Signed)
NAME:  Jamie [name], MRN:  [id number], DOB:  2000-12-26, LOS: 75 ADMISSION DATE:  02/06/2021, CONSULTATION DATE:  10/28 REFERRING MD:  Raymondo Band, CHIEF COMPLAINT:  Seizure, acute metabolic encephalopathy   History of Present Illness:  20 y/o female admitted with seizure and change in mental status.  She was treated with steroids and IVIg but despite this her condition worsened.  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  After treatment her mental status worsened and she developed inability to protect her airway in the setting of tachypnea and tachycardia.  She required intubation on 10/28.   Pertinent  Medical History  Acquired autoimmune hypothyroidism  Significant Hospital Events: Including procedures, antibiotic start and stop dates in addition to other pertinent events   10/16 Admit 10/25 New onset seizure activity, PCCM consult 1026 pulmonary critical care signed off 02/19/2021 pulmonary critical care reconsulted for suspected respiratory distress.  intubated, continuous EEG started 10/30 starting plasmapheresis 10/31 MRI normal, EEG with GPD+ 11/1 RN reports pt seized while in MRI , additional agents added overnight.   11/5 completed 4th session of PLEX 11/7 due for 5/5 PLEX. Afternoon R blown pupil. No etiology on imaging.  11/8 trach  Micro/labs: 10/16 influenza AB PCR negative, SARS-CoV-2 PCR negative 10/16 HIV antibody negative 10/16 urine culture multiple species suggest recollection 10/16 urine drug screen negative, alcohol less than 10 10/17 CSF lymphocytic pleocytosis, fungal culture negative, CSF culture negative 10/17 CSF HSV 1 DNA negative, HSV-2 DNA negative, VZV PCR CSF negative 10/17 CSF cryptococcal antigen negative, cryptococcal antigen titer not indicated 10/17 CSF EBV positive  10/17 CMV qualitative CSF negative 10/17 NMDA IgG on CSF > 1:2560 10/18 HIV RNA quant less than 20 10/20 NMDA IgG serum: positive 1:1000 10/22 RPR  nonreactive 10/22 QuantiFERON gold intermediate 10/23 EBV DNA QuantiFERON by PCR positive, less than 35 10/25 hepatitis B surface antigen nonreactive, hepatitis B core total antibody positive, hepatitis B antibody positive, hepatitis B DNA not detected 10/25 hepatitis C antibody negative 10/26 blood culture no growth to date 11/2 Tracheal aspirate abundant MSSA   Imaging: 10/16 CT head negative 10/16 MRI brain incomplete study due to patient intolerance without evidence of acute intracranial abnormality 10/22 chest abdomen pelvis CT scan no acute intrathoracic abdominal or pelvic pathology, no evidence of malignancy 10/22 MRI brain normal MRI of the brain 10/26 pelvic ultrasound right ovary not visualized, normal appearance of left ovary and uterus, trace free fluid in pelvis 10/27 pelvic MRI: Normal pelvic MRI, symmetric normal ovaries, no adnexal masses 10/31 MRI > no acute infarct or hemorrhage. Normal MRI.  11/7 CT H no acute abnormality 11/7 MRI brain no acute process   Procedures: 10/28 endotracheal tube >  10/29 R IJ HD catheter >  11/1 RUE PICC >  Interim History / Subjective:  Yesterday Tmax 101.6. Afebrile this am. Family at bedside Objective   Blood pressure 111/74, pulse 80, temperature 97.9 F (36.6 C), temperature source Oral, resp. rate 18, height 5\' 2"  (1.575 m), weight 56.2 kg, last menstrual period 01/11/2021, SpO2 100 %.    Vent Mode: PRVC FiO2 (%):  [30 %] 30 % Set Rate:  [18 bmp] 18 bmp Vt Set:  [400 mL] 400 mL PEEP:  [5 cmH20] 5 cmH20 Plateau Pressure:  [13 cmH20-17 cmH20] 16 cmH20   Intake/Output Summary (Last 24 hours) at 03/05/2021 0810 Last data filed at 03/05/2021 0600 Gross per 24 hour  Intake 2137.24 ml  Output 2900 ml  Net -762.76 ml   Filed  Weights   03/03/21 0437 03/04/21 0500 03/05/21 0411  Weight: 50.9 kg 55.9 kg 56.2 kg   Examination: General: Young adult female, s/p trach Neuro: Eyes closed, does not open, no response to painful  stimuli.  Right pupil is dilated and fixed, left 5 mm, reactive to light.  Positive cough and gag HEENT: [place]/AT, Trach C/D/I. Cardiovascular: RRR, no M/R/G.  Lungs: Respirations even and unlabored.  CTA bilaterally, No W/R/R.  Abdomen: BS x 4, soft, NT/ND.  Musculoskeletal: No gross deformities, no edema.  Skin: Intact, warm, no rashes.  Physical Exam: General: Well-appearing, no acute distress HENT: [place], AT, OP clear, MMM Eyes: EOMI, no scleral icterus Lymph: no cervical lymphadenopathy Respiratory: Clear to auscultation bilaterally.  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  MRI pelvis neg for ovarian teratoma. Anisocoria with right eye mydriasis - new 11/7 afternoon, Without imaging findings revealing etiology, probably related to encephalitis  S/p Rituxan 11/9-11/10. Appreciate neurology input BID clonazepam for dyskinesia. Avoid using Ativan and tramadol for dyskinesias per neuro Cont Keppra and depakote.  Acute respiratory failure with hypoxemia due to status epilepticus s/p trach 11/8 LLL MSSA HCAP  Continue vent support. Mental status is poor Continue lung protective ventilation  Routine bronchial hygiene. Wean as mental status allows.  VAP, PAD, Pulm hygiene. Completed 7 days of IV cefazolin  Acute pericarditis with small pericardial effusion.  cont colchicine.  Last seen by Cardiology 11/8. Will discuss ongoing plan  Anemia of critical illness S/p 1 unit PRBC Trend CBC.  Transfuse for Hgb < 7.  Hashimoto  thyroiditis.  Continue Synthroid.   Hypokalemia Replete potassium supplements and monitor  Best Practice (right click and "Reselect all SmartList Selections" daily)  Diet/type: tubefeeds DVT prophylaxis: LMWH GI prophylaxis: PPI Lines: Central line (PICC placed 11/1) Foley:  N/A Code Status:  full code Last date of multidisciplinary goals of care discussion -11/10, patient's mother was updated at bedside  The patient is critically ill with multiple organ systems failure and requires high complexity decision making for assessment and support, frequent evaluation and titration of therapies, application of advanced monitoring technologies and extensive interpretation of multiple databases.  Independent Critical Care Time: 37 Minutes.   Rodman Pickle, M.D. [facility] Pulmonary/Critical Care Medicine 03/05/2021 8:10 AM   Please see Amion for pager number to reach on-call Pulmonary and Critical Care Team.

## 2021-03-05 NOTE — Progress Notes (Signed)
Subjective: T-max one 101.6 Fahrenheit yesterday  ROS: Unable to obtain due to poor mental status   Examination  Vital signs in last 24 hours: Temp:  [97.8 F (36.6 C)-101.6 F (38.7 C)] 98 F (36.7 C) (11/11 1156) Pulse Rate:  [72-117] 86 (11/11 1100) Resp:  [14-39] 18 (11/11 1100) BP: (98-140)/(25-117) 98/67 (11/11 1100) SpO2:  [95 %-100 %] 100 % (11/11 1100) FiO2 (%):  [30 %] 30 % (11/11 1151) Weight:  [56.2 kg] 56.2 kg (11/11 0411)  General: lying in bed, NAD CVS: pulse-normal rate and rhythm RS: +trach, coarse breath sounds bilaterally Extremities: normal, +edema  Neuro: Does not open eyes to noxious stimuli but does appear to be blinking, right pupil dilated and not reacting to light, left pupil reacting to light, no apparent facial asymmetry, corneal reflex intact, intermittently moving bilateral upper extremities against gravity, did not withdraw to noxious stimuli in bilateral lower extremities.  During exam patient intermittently has episodes where she has spontaneous eye opening as well as nonrhythmic movements of the lips which at times is associated with bilateral upper extremity twitching.  These are most likely dyskinesias.  Basic Metabolic Panel: Recent Labs  Lab 02/27/21 0325 02/27/21 1200 02/27/21 1400 02/28/21 0607 03/01/21 0414 03/01/21 1609 03/02/21 0436 03/03/21 0429 03/04/21 0459 03/05/21 0403  NA 141   < > 143 140 143 142 141 138 138 139  K 2.8*   < > 4.4 3.3* 3.6 3.7 2.8* 3.8 3.2* 3.2*  CL 109   < > 114* 107 108 107 110 103 102 102  CO2 25   < > 25 28 29 29 24 25 28 28   GLUCOSE 104*   < > 113* 115* 119* 105* 87 114* 119* 138*  BUN 11   < > 9 9 8 8 12 12 9 15   CREATININE 0.36*   < > <0.30* 0.30* 0.37* 0.36* 0.42* 0.41* 0.33* <0.30*  CALCIUM 7.5*   < > 8.3* 8.6* 8.9 8.7* 8.3* 8.9 8.1* 9.0  MG 1.9  --   --  2.1 2.1  --  2.0 2.0 2.0 2.2  PHOS 2.3*  --  2.9 2.2*  --   --   --  3.3  --  2.5   < > = values in this interval not displayed.     CBC: Recent Labs  Lab 02/28/21 0607 03/01/21 0414 03/02/21 0436 03/02/21 1614 03/03/21 0429 03/05/21 0403  WBC 7.1 9.5 8.9  --  13.4* 9.1  NEUTROABS  --   --   --   --  10.2* 5.6  HGB 7.5* 7.4* 7.0* 10.1* 10.3* 10.0*  HCT 23.4* 24.1* 22.8* 32.3* 31.8* 32.0*  MCV 95.5 96.8 97.4  --  91.1 94.4  PLT 185 300 335  --  408* 437*     Coagulation Studies: No results for input(s): LABPROT, INR in the last 72 hours.  Imaging No new brain imaging overnight   ASSESSMENT AND PLAN: 20 y.o. 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 intake for 4 days and decreased verbalization 10/15 prior to arrival. 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  She has since completed 5 days of IV Solu-Medrol, 5 days of IVIG, 5 days of Plex and 1 dose of Rituxan on 03/04/2021.    Summary of work-up so far    Thyroperoxidase antibody 68 Thyroglobulin antibody 57.6 Lumbar puncture on 02/08/2021: 318 WBCs (94  lymphocytes, 0 neutrophils), total protein 50, glucose 68, fungal culture negative, negative HSV 1 and 2 DNA, negative VZV, EBV qualitative positive, CMV CSF negative, cryptococcal antigen negative, NMDA CSF  1:1280,  3 oligoclonal bands, IgG CSF 10.5, IgG/albumin ratio in CSF 0.40, CSF IgG index 1.4, CSF path showed lymphocytosis NMDA IgG  >1:2560, NMDA antibody titer 1:1000 Serum RPR negative, quantifying TB Gold negative EBV DNA by PCR <35 ANA positive dsDNA antibody negative Ribonucleoprotein negative  ENA SN negative Scleroderma antibody negative SSA 1.7 SSB negative  chromatin antibody negative  anti-Jo 1 antibody negative  centromere antibody screen negative JC virus DNA PCR, blood negative Protein electrophoresis, serum showed Albumin-ELP 2.7 (2.9-4.4) and gammaglobulin 2.6 ( 0.4-1.8) IgG immunoglobulin in serum 2748 (  834-1962) Hepatitis B surface antigen nonreactive Hepatitis B core antibody reactive Hepatitis B post: 856 Hepatitis C Antibody negative EBV VCA IgM normal, IgG >600 ( 0-17.9) and EBV NA IgG >600 (0-17.9)   NMDA encephalitis Acute encephalopathy, due to NMDA encephalitis and seizures New onset seizures Acute respiratory failure Dyskinesias Right eye mydriasis Recurrent fever -No clinical seizures.  Does have episodes of dyskinesias.   Recommendation -We will increase Klonopin 0.5 mg at 0700, 1200, 1700 and continue Klonopin 1 mg nightly -Continue Depakote 500 mg every 8 hours and Keppra 1500 mg twice daily -Recurrent fever could be due to NMDA encephalitis.  However, if patient remains comatose we will discuss with ID again next week -Discussed plan in detail with patient's mother at bedside and patient's RN   I have spent a total of 36 minutes with the patient reviewing hospital notes,  test results, labs and examining the patient as well as establishing an assessment and plan.  > 50% of time was spent in direct patient care.    Zeb Comfort Epilepsy [facility] For questions after 5pm please refer to AMION to reach the Neurologist on call

## 2021-03-05 NOTE — Progress Notes (Signed)
PT Cancellation Note  Patient Details Name: Jamie [name] MRN: [id number] DOB: 01-28-2001   Cancelled Treatment:    Reason Eval/Treat Not Completed: Patient not medically ready Patient continues to be unresponsive. Patient is not medically ready for PT evaluation. Will sign off at this time. Please re-consult when patient is medically ready.   Zhanna Melin A. Gilford Rile PT, DPT Acute Rehabilitation Services Pager [phone] Office [phone]    [name] 03/05/2021, 9:02 AM

## 2021-03-05 NOTE — Progress Notes (Signed)
[facility] ADULT ICU REPLACEMENT PROTOCOL   The patient does apply for the [facility] Adult ICU Electrolyte Replacment Protocol based on the criteria listed below:   1.Exclusion criteria: TCTS patients, ECMO patients, and Dialysis patients 2. Is GFR >/= 30 ml/min? Yes.    Patient's GFR today is not calculated 3. Is SCr </= 2? Yes.   Patient's SCr is <0.30 mg/dL 4. Did SCr increase >/= 0.5 in 24 hours? No. 5.Pt's weight >40kg  Yes.   6. Abnormal electrolyte(s): K+ 3.2  7. Electrolytes replaced per protocol 8.  Call MD STAT for K+ </= 2.5, Phos </= 1, or Mag </= 1 Physician:  n/a  Darlys Gales 03/05/2021 5:23 AM

## 2021-03-06 ENCOUNTER — Inpatient Hospital Stay (HOSPITAL_COMMUNITY): Payer: Medicaid Other

## 2021-03-06 DIAGNOSIS — G0490 Encephalitis and encephalomyelitis, unspecified: Secondary | ICD-10-CM | POA: Diagnosis not present

## 2021-03-06 LAB — URINALYSIS, COMPLETE (UACMP) WITH MICROSCOPIC
Bilirubin Urine: NEGATIVE
Glucose, UA: NEGATIVE mg/dL
Hgb urine dipstick: NEGATIVE
Ketones, ur: NEGATIVE mg/dL
Leukocytes,Ua: NEGATIVE
Nitrite: NEGATIVE
Protein, ur: NEGATIVE mg/dL
Specific Gravity, Urine: 1.015 (ref 1.005–1.030)
pH: 7 (ref 5.0–8.0)

## 2021-03-06 LAB — PROCALCITONIN: Procalcitonin: <0.1 ng/mL

## 2021-03-06 LAB — CBC
HCT: 30.8 % — ABNORMAL LOW (ref 36.0–46.0)
Hemoglobin: 9.4 g/dL — ABNORMAL LOW (ref 12.0–15.0)
MCH: 28.8 pg (ref 26.0–34.0)
MCHC: 30.5 g/dL (ref 30.0–36.0)
MCV: 94.5 fL (ref 80.0–100.0)
Platelets: 496 10*3/uL — ABNORMAL HIGH (ref 150–400)
RBC: 3.26 MIL/uL — ABNORMAL LOW (ref 3.87–5.11)
RDW: 18 % — ABNORMAL HIGH (ref 11.5–15.5)
WBC: 9.2 10*3/uL (ref 4.0–10.5)
nRBC: 0 % (ref 0.0–0.2)

## 2021-03-06 LAB — GLUCOSE, CAPILLARY
Glucose-Capillary: 101 mg/dL — ABNORMAL HIGH (ref 70–99)
Glucose-Capillary: 102 mg/dL — ABNORMAL HIGH (ref 70–99)
Glucose-Capillary: 105 mg/dL — ABNORMAL HIGH (ref 70–99)
Glucose-Capillary: 108 mg/dL — ABNORMAL HIGH (ref 70–99)
Glucose-Capillary: 109 mg/dL — ABNORMAL HIGH (ref 70–99)
Glucose-Capillary: 113 mg/dL — ABNORMAL HIGH (ref 70–99)

## 2021-03-06 LAB — COMPREHENSIVE METABOLIC PANEL
ALT: 11 U/L (ref 0–44)
AST: 21 U/L (ref 15–41)
Albumin: 3.5 g/dL (ref 3.5–5.0)
Alkaline Phosphatase: 32 U/L — ABNORMAL LOW (ref 38–126)
Anion gap: 8 (ref 5–15)
BUN: 16 mg/dL (ref 6–20)
CO2: 30 mmol/L (ref 22–32)
Calcium: 9.2 mg/dL (ref 8.9–10.3)
Chloride: 103 mmol/L (ref 98–111)
Creatinine, Ser: 0.41 mg/dL — ABNORMAL LOW (ref 0.44–1.00)
GFR, Estimated: >60 mL/min (ref >60)
Glucose, Bld: 99 mg/dL (ref 70–99)
Potassium: 3.7 mmol/L (ref 3.5–5.1)
Sodium: 141 mmol/L (ref 135–145)
Total Bilirubin: 0.3 mg/dL (ref 0.3–1.2)
Total Protein: 6.5 g/dL (ref 6.5–8.1)

## 2021-03-06 LAB — MAGNESIUM: Magnesium: 2.2 mg/dL (ref 1.7–2.4)

## 2021-03-06 LAB — CK: Total CK: 56 U/L (ref 38–234)

## 2021-03-06 LAB — LACTIC ACID, PLASMA
Lactic Acid, Venous: 1.3 mmol/L (ref 0.5–1.9)
Lactic Acid, Venous: 1 mmol/L (ref 0.5–1.9)

## 2021-03-06 MED ORDER — LORAZEPAM 2 MG/ML IJ SOLN
INTRAMUSCULAR | Status: AC
  Filled 2021-03-06: qty 1

## 2021-03-06 MED ORDER — PROPOFOL 10 MG/ML IV BOLUS
50.0000 mg | Freq: Once | INTRAVENOUS | Status: AC
  Administered 2021-03-06: 50 mg via INTRAVENOUS
  Filled 2021-03-06: qty 20

## 2021-03-06 MED ORDER — SODIUM CHLORIDE 0.9 % IV SOLN
200.0000 mg | Freq: Once | INTRAVENOUS | Status: AC
  Administered 2021-03-06: 200 mg via INTRAVENOUS
  Filled 2021-03-06: qty 20

## 2021-03-06 MED ORDER — PROPOFOL 1000 MG/100ML IV EMUL
0.0000 ug/kg/min | INTRAVENOUS | Status: DC
  Administered 2021-03-06: 40 ug/kg/min via INTRAVENOUS
  Administered 2021-03-07 (×2): 10 ug/kg/min via INTRAVENOUS
  Filled 2021-03-06 (×3): qty 100

## 2021-03-06 MED ORDER — LORAZEPAM 2 MG/ML IJ SOLN
0.5000 mg | INTRAMUSCULAR | Status: DC | PRN
  Administered 2021-03-06 – 2021-03-08 (×9): 2 mg via INTRAVENOUS
  Filled 2021-03-06 (×9): qty 1

## 2021-03-06 NOTE — Progress Notes (Signed)
Briefly evaluated Jamie [name] at bedside. She has rhythmic twitching of all of her extremities with her HR upto 170s. She has been given ativan 6mg  total nd several pushes of Fentanyl with no significant change. I have ordered Vimpat 200mg  IV once and will start her on propofol, get a STAT cEEG anda STAT CTH. She probably will not be able to stay still for the CT Head until these movements subside.  In addittion, will get workup for possible infection given fever with blood cultures x 2, lactate, procalcitonin, UA and Chest X ray. Important to note that she was recently given Rituximab and is high risk for infections. Fever and infection can also worsen dyskinesias.  Will also reach out to [facility] to see if there is other workup for infection in addition to above that they would recommend.  [place] Pager Number [phone]

## 2021-03-06 NOTE — Progress Notes (Signed)
LTM hooked up at bedside. No skin breakdown noted. Results pending. °

## 2021-03-06 NOTE — Progress Notes (Signed)
Transported pt to CT and back to room 4N28 with no issues.

## 2021-03-06 NOTE — Progress Notes (Signed)
[facility] Progress Note Patient Name: Jamie [name] DOB: 07-Feb-2001 MRN: [id number]   Date of Service  03/06/2021  HPI/Events of Note  Patient with episodic muscle twitching, seizure vs myoclonus vs fasciculations?  eICU Interventions  PRN Ativan ordered, CK ordered to screen for myopathy.        [name] Nello Corro 03/06/2021, 12:54 AM

## 2021-03-06 NOTE — Progress Notes (Signed)
Subjective/interval events:  Had an episode overnight of rhythmic twitching of all of her extremities at about 1 to 2 Hz, with heart rates in the 170s which did not respond to 6 mg of Ativan and several pushes of fentanyl, in the setting of fever to 101.2 (38.4).  Vimpat 200 mg IV was ordered and she was started on propofol to control movements so that stat head CT could be obtained.  Stat EEG was also ordered, and infectious work-up with blood cultures x2, lactate, procalcitonin, UA and chest x-ray  ROS: Unable to obtain due to poor mental status   Examination  Vital signs in last 24 hours: Temp:  [98 F (36.7 C)-101.2 F (38.4 C)] 100.1 F (37.8 C) (11/12 0800) Pulse Rate:  [13-163] 113 (11/12 1000) Resp:  [18-61] 21 (11/12 1000) BP: (90-194)/(46-157) 129/91 (11/12 1000) SpO2:  [95 %-100 %] 99 % (11/12 1000) FiO2 (%):  [30 %] 30 % (11/12 0830) Weight:  [56.2 kg] 56.2 kg (11/12 0146)  General: lying in bed, NAD HEENT: She has some blinking movements of her eyes and pursing of her lips there appears to be dyskinetic movement CVS: pulse-normal rate and rhythm RS: +trach, coarse breath sounds bilaterally Extremities: normal, +edema  Neuro: Does not open eyes to noxious stimuli but does appear to be blinking, right pupil dilated but today reactive to light, 6 mm to 5 mm, left pupil reacting to light 51mm to 2 mm, no apparent facial asymmetry, corneal reflex intact, intermittently moving bilateral upper extremities against gravity per nursing but not observed on my examination and no clear movement to noxious stimulation though she is on increased sedation, did not withdraw to noxious stimuli in bilateral lower extremities.   On prior exams, patient was noted to intermittently have episodes where she has spontaneous eye opening as well as nonrhythmic movements of the lips which at times is associated with bilateral upper extremity twitching.  These are most likely dyskinesias.  Basic Metabolic  Panel: Recent Labs  Lab 02/27/21 1400 02/28/21 0607 03/01/21 0414 03/02/21 0436 03/03/21 0429 03/04/21 0459 03/05/21 0403 03/06/21 0155 03/06/21 0756  NA 143 140   < > 141 138 138 139  --  141  K 4.4 3.3*   < > 2.8* 3.8 3.2* 3.2*  --  3.7  CL 114* 107   < > 110 103 102 102  --  103  CO2 25 28   < > 24 25 28 28   --  30  GLUCOSE 113* 115*   < > 87 114* 119* 138*  --  99  BUN 9 9   < > 12 12 9 15   --  16  CREATININE <0.30* 0.30*   < > 0.42* 0.41* 0.33* <0.30*  --  0.41*  CALCIUM 8.3* 8.6*   < > 8.3* 8.9 8.1* 9.0  --  9.2  MG  --  2.1   < > 2.0 2.0 2.0 2.2 2.2  --   PHOS 2.9 2.2*  --   --  3.3  --  2.5  --   --    < > = values in this interval not displayed.     CBC: Recent Labs  Lab 03/01/21 0414 03/02/21 0436 03/02/21 1614 03/03/21 0429 03/05/21 0403 03/06/21 0756  WBC 9.5 8.9  --  13.4* 9.1 9.2  NEUTROABS  --   --   --  10.2* 5.6  --   HGB 7.4* 7.0* 10.1* 10.3* 10.0* 9.4*  HCT 24.1* 22.8* 32.3*  31.8* 32.0* 30.8*  MCV 96.8 97.4  --  91.1 94.4 94.5  PLT 300 335  --  408* 437* 496*      Coagulation Studies: No results for input(s): LABPROT, INR in the last 72 hours.  Imaging Head CT personally reviewed, agree with radiology  Chest x-ray with some haziness in the left lung read as normal by radiology  Lactate 1.0 Procalcitonin undetectable, UA negative for infection, Blood cultures collected and pending  ASSESSMENT AND PLAN: 20 y.o. 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 intake for 4 days and decreased verbalization 10/15 prior to arrival. 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  She has since completed 5 days of IV Solu-Medrol, 5 days of IVIG, 5 days of Plex and 1 dose of Rituxan on 03/04/2021.    The patient's overnight event was impressive and given her NMDA receptor encephalitis she is at high risk  of recurrent seizures.  Agree with Dr. Bartholome Bill plan to monitor patient's EEG activity again at this time.  Preliminary infectious work-up is fairly reassuring, and low lactate also is reassuring against seizure activity however is not a sensitive sign.  I am reassured by her examination however, given the return of reactivity of her right pupil and no other worsening.  Summary of work-up so far  Thyroperoxidase antibody 68 Thyroglobulin antibody 57.6 Lumbar puncture on 02/08/2021: 318 WBCs (94 lymphocytes, 0 neutrophils), total protein 50, glucose 68, fungal culture negative, negative HSV 1 and 2 DNA, negative VZV, EBV qualitative positive, CMV CSF negative, cryptococcal antigen negative, NMDA CSF  1:1280,  3 oligoclonal bands, IgG CSF 10.5, IgG/albumin ratio in CSF 0.40, CSF IgG index 1.4, CSF path showed lymphocytosis NMDA IgG  >1:2560, NMDA antibody titer 1:1000 Serum RPR negative, quantifying TB Gold negative EBV DNA by PCR <35 ANA positive dsDNA antibody negative Ribonucleoprotein negative  ENA SN negative Scleroderma antibody negative SSA 1.7 SSB negative  chromatin antibody negative  anti-Jo 1 antibody negative  centromere antibody screen negative JC virus DNA PCR, blood negative Protein electrophoresis, serum showed Albumin-ELP 2.7 (2.9-4.4) and gammaglobulin 2.6 ( 0.4-1.8) IgG immunoglobulin in serum 2748 ( [phone]) Hepatitis B surface antigen nonreactive Hepatitis B core antibody reactive Hepatitis B post: 856 Hepatitis C Antibody negative EBV VCA IgM normal, IgG >600 ( 0-17.9) and EBV NA IgG >600 (0-17.9)  Impression: NMDA encephalitis Acute encephalopathy, due to NMDA encephalitis and seizures New onset seizures Acute respiratory failure Dyskinesias Right eye mydriasis Recurrent fever Event concerning for seizure versus severe dyskinetic episode versus autonomic instability overnight  Recommendation -Continue Klonopin 0.5 mg at 0700, 1200, 1700 and continue  Klonopin 1 mg nightly -Continue Depakote 500 mg every 8 hours and Keppra 1500 mg twice daily -S/p Vimpat 200 mg loading dose this morning, check EKG to confirm PR interval remained stable -Pending EEG results, will consider adding standing dose of Vimpat -Agree with Dr. Hortense Ramal, and Dr. Lorrin Goodell, recurrent fever could be due to NMDA encephalitis.  Preliminary infectious work-up is reassuring though we will continue to monitor.  Appreciate CCM assistance with this.  Given examination is improving (return of right pupillary reflex), doubt CNS infection at this time.  However, if patient remains comatose we will discuss with ID again next week, and if there is further worsening, repeat LP could be considered -Discussed plan in detail patient's RN,  patient's mother at bedside was sleeping at the time of  my evaluation, discussed with CCM via secure chat   I have spent a total of 40 minutes with the patient reviewing hospital notes,  test results, labs and examining the patient as well as establishing an assessment and plan.  > 50% of time was spent in direct patient care.   Lesleigh Noe MD-PhD [facility] [phone]   CRITICAL CARE Performed by: Lorenza Chick  Total critical care time: 40 minutes  Critical care time was exclusive of separately billable procedures and treating other patients.  Critical care was necessary to treat or prevent imminent or life-threatening deterioration.  Critical care was time spent personally by me on the following activities: development of treatment plan with patient and/or surrogate as well as nursing, discussions with consultants, evaluation of patient's response to treatment, examination of patient, obtaining history from patient or surrogate, ordering and performing treatments and interventions, ordering and review of laboratory studies, ordering and review of radiographic studies, pulse oximetry and re-evaluation of patient's condition.

## 2021-03-06 NOTE — Significant Event (Signed)
Significant event: Neurology was called to bedside for 15 minutes of "seizure" activity. Tachycardia.   Her rhythmic twitching does not correlate with seizure on cEEG (placed today) nor rEEGs in past. Last pm, overnight neurologist was called for same. Patient did not respond to Ativan 6mg  or several pushes of Fentanyl.  Infectious workup was ordered.   See Dr. Lyn Records progress note today. On exam, her rhythmical movements are affecting her right side more than left. She has dyskinetic movements of face as well. She is unresponsive. No blink response to eyelash stimulation. PCCM was consulted to help with sedation. Patient has tracheostomy on ventilator. Her HR is 140s down to 130s on exam. There is artifact noted on tele rhythmic due to her movements.   We do not want to abort activity as these movements are not consistent with seizure activity on cEEG thus far, and she did not respond to high dose of Ativan or Fentanyl last pm.    We will increase her Clonazepam, but not AEDs at this time. Dyskinetic movements are likely due to NMDA encephalitis which also increases risk of seizures.   Patient seen by NP and MD. Will f/up cEEG,   Clance Boll, MSN, APN-BC Neurology Nurse Practitioner Pager [phone]

## 2021-03-06 NOTE — Progress Notes (Signed)
NAME:  Jamie [name], MRN:  [id number], DOB:  07/30/2000, LOS: 38 ADMISSION DATE:  02/06/2021, CONSULTATION DATE:  10/28 REFERRING MD:  Raymondo Band, CHIEF COMPLAINT:  Seizure, acute metabolic encephalopathy   History of Present Illness:  20 y/o female admitted with seizure and change in mental status.  She was treated with steroids and IVIg but despite this her condition worsened.  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  After treatment her mental status worsened and she developed inability to protect her airway in the setting of tachypnea and tachycardia.  She required intubation on 10/28.   Pertinent  Medical History  Acquired autoimmune hypothyroidism  Significant Hospital Events: Including procedures, antibiotic start and stop dates in addition to other pertinent events   10/16 Admit 10/25 New onset seizure activity, PCCM consult 1026 pulmonary critical care signed off 02/19/2021 pulmonary critical care reconsulted for suspected respiratory distress.  intubated, continuous EEG started 10/30 starting plasmapheresis 10/31 MRI normal, EEG with GPD+ 11/1 RN reports pt seized while in MRI , additional agents added overnight.   11/5 completed 4th session of PLEX 11/7 due for 5/5 PLEX. Afternoon R blown pupil. No etiology on imaging.  11/8 trach  Micro/labs: 10/16 influenza AB PCR negative, SARS-CoV-2 PCR negative 10/16 HIV antibody negative 10/16 urine culture multiple species suggest recollection 10/16 urine drug screen negative, alcohol less than 10 10/17 CSF lymphocytic pleocytosis, fungal culture negative, CSF culture negative 10/17 CSF HSV 1 DNA negative, HSV-2 DNA negative, VZV PCR CSF negative 10/17 CSF cryptococcal antigen negative, cryptococcal antigen titer not indicated 10/17 CSF EBV positive  10/17 CMV qualitative CSF negative 10/17 NMDA IgG on CSF > 1:2560 10/18 HIV RNA quant less than 20 10/20 NMDA IgG serum: positive 1:1000 10/22 RPR  nonreactive 10/22 QuantiFERON gold intermediate 10/23 EBV DNA QuantiFERON by PCR positive, less than 35 10/25 hepatitis B surface antigen nonreactive, hepatitis B core total antibody positive, hepatitis B antibody positive, hepatitis B DNA not detected 10/25 hepatitis C antibody negative 10/26 blood culture no growth to date 11/2 Tracheal aspirate abundant MSSA   Imaging: 10/16 CT head negative 10/16 MRI brain incomplete study due to patient intolerance without evidence of acute intracranial abnormality 10/22 chest abdomen pelvis CT scan no acute intrathoracic abdominal or pelvic pathology, no evidence of malignancy 10/22 MRI brain normal MRI of the brain 10/26 pelvic ultrasound right ovary not visualized, normal appearance of left ovary and uterus, trace free fluid in pelvis 10/27 pelvic MRI: Normal pelvic MRI, symmetric normal ovaries, no adnexal masses 10/31 MRI > no acute infarct or hemorrhage. Normal MRI.  11/7 CT H no acute abnormality 11/7 MRI brain no acute process   Procedures: 10/28 endotracheal tube >  10/29 R IJ HD catheter >  11/1 RUE PICC >  Interim History / Subjective:  Overnight febrile and tachycardic. She received ativan and fentanyl for rhythmic movements without improvement. Neuro ordered Vimpat once and started propofol. Infectious work-up ordered Objective   Blood pressure 119/77, pulse (!) 117, temperature (!) 101.2 F (38.4 C), temperature source Oral, resp. rate (!) 21, height 5\' 2"  (1.575 m), weight 56.2 kg, last menstrual period 01/11/2021, SpO2 99 %.    Vent Mode: PRVC FiO2 (%):  [30 %] 30 % Set Rate:  [18 bmp] 18 bmp Vt Set:  [400 mL] 400 mL PEEP:  [5 cmH20] 5 cmH20 Pressure Support:  [10 cmH20] 10 cmH20 Plateau Pressure:  [14 cmH20] 14 cmH20   Intake/Output Summary (Last 24 hours) at 03/06/2021 0747 Last  data filed at 03/06/2021 0600 Gross per 24 hour  Intake 1884.72 ml  Output 1450 ml  Net 434.72 ml   Filed Weights   03/04/21 0500 03/05/21  0411 03/06/21 0146  Weight: 55.9 kg 56.2 kg 56.2 kg   Physical Exam: General: Critically ill-appearing, does not engage HENT: [place], AT, OP clear, MMM Neck: Trach in place, thin clear secretions present Respiratory: Coarse breath sounds bilaterally Cardiovascular: Tachycardic, RR, -M/R/G, no JVD GI: BS+, soft, nontender Extremities:-Edema,-tenderness Neuro: Right pupil dilated and reactive, left 5 mm, reactive, +cough/gag. 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  MRI pelvis neg for ovarian teratoma. Anisocoria with right eye mydriasis - new 11/7 afternoon, Without imaging findings revealing etiology, probably related to encephalitis  S/p Rituxan 11/9-11/10. Worsening dyskinesias +/- twitching overnight - Appreciate neurology input - BID clonazepam for dyskinesia - Avoid using Ativan and tramadol for dyskinesias per neuro - Cont Keppra and depakote  Fever, tachycardia May be related to encephalitis Consider sepsis in setting of immunosuppressed state due to rituximab. WBC and LA wnl. 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  - Continue lung protective ventilation  - Routine bronchial hygiene. -Wean as mental status allows.  - VAP, PAD, Pulm hygiene.  Acute pericarditis with small pericardial effusion.  - Appreciate Cardiology input - cont colchicine for three month with plan for repeat echo after treatment    Anemia of critical illness S/p 1 unit PRBC - Trend CBC. - Transfuse for Hgb < 7.  Hashimoto thyroiditis.  - Continue Synthroid.   Hypokalemia - Replete potassium supplements and monitor  Best Practice (right click and "Reselect all SmartList Selections" daily)  Diet/type: tubefeeds DVT prophylaxis: LMWH GI prophylaxis: PPI Lines: Central line (PICC placed 11/1) Foley:  N/A Code Status:  full code Last date of multidisciplinary goals of care discussion -11/10, patient's mother was updated at bedside  The patient is critically ill with multiple organ systems failure and requires high complexity decision making for assessment and support, frequent evaluation and titration of therapies, application of advanced monitoring technologies and extensive interpretation of multiple databases.  Independent Critical Care Time: 13 Minutes.   Rodman Pickle, M.D. [facility] Pulmonary/Critical Care Medicine 03/06/2021 7:47 AM   Please see Amion for pager number to reach on-call Pulmonary and Critical Care Team.

## 2021-03-07 DIAGNOSIS — G0490 Encephalitis and encephalomyelitis, unspecified: Secondary | ICD-10-CM | POA: Diagnosis not present

## 2021-03-07 LAB — BASIC METABOLIC PANEL
Anion gap: 7 (ref 5–15)
Anion gap: 9 (ref 5–15)
BUN: 17 mg/dL (ref 6–20)
BUN: 25 mg/dL — ABNORMAL HIGH (ref 6–20)
CO2: 30 mmol/L (ref 22–32)
CO2: 30 mmol/L (ref 22–32)
Calcium: 9.1 mg/dL (ref 8.9–10.3)
Calcium: 9.6 mg/dL (ref 8.9–10.3)
Chloride: 106 mmol/L (ref 98–111)
Chloride: 104 mmol/L (ref 98–111)
Creatinine, Ser: 0.36 mg/dL — ABNORMAL LOW (ref 0.44–1.00)
Creatinine, Ser: 0.44 mg/dL (ref 0.44–1.00)
GFR, Estimated: >60 mL/min (ref >60)
GFR, Estimated: >60 mL/min (ref >60)
Glucose, Bld: 126 mg/dL — ABNORMAL HIGH (ref 70–99)
Glucose, Bld: 112 mg/dL — ABNORMAL HIGH (ref 70–99)
Potassium: 3.2 mmol/L — ABNORMAL LOW (ref 3.5–5.1)
Potassium: 4.4 mmol/L (ref 3.5–5.1)
Sodium: 143 mmol/L (ref 135–145)
Sodium: 143 mmol/L (ref 135–145)

## 2021-03-07 LAB — GLUCOSE, CAPILLARY
Glucose-Capillary: 106 mg/dL — ABNORMAL HIGH (ref 70–99)
Glucose-Capillary: 109 mg/dL — ABNORMAL HIGH (ref 70–99)
Glucose-Capillary: 118 mg/dL — ABNORMAL HIGH (ref 70–99)
Glucose-Capillary: 121 mg/dL — ABNORMAL HIGH (ref 70–99)
Glucose-Capillary: 135 mg/dL — ABNORMAL HIGH (ref 70–99)
Glucose-Capillary: 96 mg/dL (ref 70–99)

## 2021-03-07 LAB — BLOOD CULTURE ID PANEL (REFLEXED) - BCID2

## 2021-03-07 LAB — TRIGLYCERIDES: Triglycerides: 337 mg/dL — ABNORMAL HIGH (ref <150)

## 2021-03-07 LAB — MAGNESIUM: Magnesium: 2.3 mg/dL (ref 1.7–2.4)

## 2021-03-07 MED ORDER — POTASSIUM CHLORIDE CRYS ER 20 MEQ PO TBCR
40.0000 meq | EXTENDED_RELEASE_TABLET | ORAL | Status: DC
  Filled 2021-03-07: qty 2

## 2021-03-07 MED ORDER — MORPHINE SULFATE (PF) 2 MG/ML IV SOLN
2.0000 mg | INTRAVENOUS | Status: DC | PRN
  Administered 2021-03-07 – 2021-03-08 (×4): 2 mg via INTRAVENOUS
  Filled 2021-03-07 (×4): qty 1

## 2021-03-07 MED ORDER — GABAPENTIN 250 MG/5ML PO SOLN
300.0000 mg | Freq: Three times a day (TID) | ORAL | Status: DC
  Filled 2021-03-07: qty 6

## 2021-03-07 MED ORDER — GABAPENTIN 250 MG/5ML PO SOLN
300.0000 mg | Freq: Three times a day (TID) | ORAL | Status: DC
  Administered 2021-03-07 – 2021-03-16 (×27): 300 mg
  Filled 2021-03-07 (×31): qty 6

## 2021-03-07 MED ORDER — GABAPENTIN 250 MG/5ML PO SOLN
300.0000 mg | Freq: Three times a day (TID) | ORAL | Status: DC
  Administered 2021-03-07: 300 mg via ORAL
  Filled 2021-03-07 (×3): qty 6

## 2021-03-07 MED ORDER — POTASSIUM CHLORIDE 20 MEQ PO PACK
40.0000 meq | PACK | ORAL | Status: AC
  Administered 2021-03-07 (×2): 40 meq
  Filled 2021-03-07 (×2): qty 2

## 2021-03-07 MED ORDER — FUROSEMIDE 10 MG/ML IJ SOLN
40.0000 mg | Freq: Once | INTRAMUSCULAR | Status: AC
  Administered 2021-03-07: 40 mg via INTRAVENOUS
  Filled 2021-03-07: qty 4

## 2021-03-07 NOTE — Procedures (Addendum)
Patient Name: Jamie [name]  MRN: [id number]  Epilepsy Attending: Lora Havens  Referring Physician/Provider: Dr. Kathrynn Speed Duration: 11/12//2022 5885 to 03/07/2021 0937   Patient history:  20 y.o. female with PMH significant for Hashimoto thyroiditis who presents with vomiting, headache and not eating for 4 days and not talking since today. EEG to evaluate for seizure   Level of alertness: lethargic   AEDs during EEG study: LEV, VPA, Clonazepam   Technical aspects: This EEG study was done with scalp electrodes positioned according to the 10-20 International system of electrode placement. Electrical activity was acquired at a sampling rate of 500Hz  and reviewed with a high frequency filter of 70Hz  and a low frequency filter of 1Hz . EEG data were recorded continuously and digitally stored.    Description: EEG initially showed continuous generalized high amplitude rhythmic 1 to 3 Hz delta slowing with overriding 15 to 18 Hz beta activity consistent with delta brush.  Multiple episodes were captured during which patient had eyebrow twitching, mouth movements as well as bilateral upper extremity twitching.  Concomitant eeg showed continuous generalized high amplitude 1 to 3 Hz-delta slowing with overriding 15-18Hz  fronto-central beta activity ( extreme delta brush).    ABNORMALITY - Delta brush, generalized - Continuous slow, generalized   IMPRESSION: This study is suggestive of moderate to severe diffuse encephalopathy, nonspecific etiology but likely secondary to NMDA encephalitis. No definite seizures or epileptiform discharges were seen during this study.  Multiple episodes were captured during which patient had eyebrow twitching, mouth movements as well as bilateral upper extremity twitching.  Concomitant eeg didn't  show any change to suggest seizure. These episodes are more likely not epileptic. Dyskinesias is in the differential.     Jamie [name]

## 2021-03-07 NOTE — Progress Notes (Signed)
LTM discontinued now. Results pending.

## 2021-03-07 NOTE — Progress Notes (Addendum)
NAME:  Jamie [name], MRN:  [id number], DOB:  [birth year], LOS: 5 ADMISSION DATE:  02/06/2021, CONSULTATION DATE:  10/28 REFERRING MD:  Raymondo Band, CHIEF COMPLAINT:  Seizure, acute metabolic encephalopathy   History of Present Illness:  20 y/o female admitted with seizure and change in mental status.  She was treated with steroids and IVIg but despite this her condition worsened.  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  After treatment her mental status worsened and she developed inability to protect her airway in the setting of tachypnea and tachycardia.  She required intubation on 10/28.   Pertinent  Medical History  Acquired autoimmune hypothyroidism  Significant Hospital Events: Including procedures, antibiotic start and stop dates in addition to other pertinent events   10/16 Admit 10/25 New onset seizure activity, PCCM consult 1026 pulmonary critical care signed off 02/19/2021 pulmonary critical care reconsulted for suspected respiratory distress.  intubated, continuous EEG started 10/30 starting plasmapheresis 10/31 MRI normal, EEG with GPD+ 11/1 RN reports pt seized while in MRI , additional agents added overnight.   11/5 completed 4th session of PLEX 11/7 due for 5/5 PLEX. Afternoon R blown pupil. No etiology on imaging.  11/8 trach 11/12 Rhythmic movements concerned for seizures. Neuro evaluated for dyskinesia  Micro/labs: 10/16 influenza AB PCR negative, SARS-CoV-2 PCR negative 10/16 HIV antibody negative 10/16 urine culture multiple species suggest recollection 10/16 urine drug screen negative, alcohol less than 10 10/17 CSF lymphocytic pleocytosis, fungal culture negative, CSF culture negative 10/17 CSF HSV 1 DNA negative, HSV-2 DNA negative, VZV PCR CSF negative 10/17 CSF cryptococcal antigen negative, cryptococcal antigen titer not indicated 10/17 CSF EBV positive  10/17 CMV qualitative CSF negative 10/17 NMDA IgG on CSF > 1:2560 10/18 HIV RNA  quant less than 20 10/20 NMDA IgG serum: positive 1:1000 10/22 RPR nonreactive 10/22 QuantiFERON gold intermediate 10/23 EBV DNA QuantiFERON by PCR positive, less than 35 10/25 hepatitis B surface antigen nonreactive, hepatitis B core total antibody positive, hepatitis B antibody positive, hepatitis B DNA not detected 10/25 hepatitis C antibody negative 10/26 blood culture no growth to date 11/2 Tracheal aspirate abundant MSSA   Imaging: 10/16 CT head negative 10/16 MRI brain incomplete study due to patient intolerance without evidence of acute intracranial abnormality 10/22 chest abdomen pelvis CT scan no acute intrathoracic abdominal or pelvic pathology, no evidence of malignancy 10/22 MRI brain normal MRI of the brain 10/26 pelvic ultrasound right ovary not visualized, normal appearance of left ovary and uterus, trace free fluid in pelvis 10/27 pelvic MRI: Normal pelvic MRI, symmetric normal ovaries, no adnexal masses 10/31 MRI > no acute infarct or hemorrhage. Normal MRI.  11/7 CT H no acute abnormality 11/7 MRI brain no acute process   Procedures: 10/28 endotracheal tube >  10/29 R IJ HD catheter >  11/1 RUE PICC >  Interim History / Subjective:  Overnight continued to have dyskinetic movements associated with tachycardia. EEG with no seizure. Afebrile and HR 88 this morning.  Objective   Blood pressure 124/80, pulse 88, temperature 97.6 F (36.4 C), temperature source Axillary, resp. rate 18, height 5\' 2"  (1.575 m), weight 56.1 kg, last menstrual period 01/11/2021, SpO2 100 %.    Vent Mode: PRVC FiO2 (%):  [30 %] 30 % Set Rate:  [18 bmp] 18 bmp Vt Set:  [400 mL] 400 mL PEEP:  [5 cmH20] 5 cmH20 Plateau Pressure:  [13 cmH20-15 cmH20] 14 cmH20   Intake/Output Summary (Last 24 hours) at 03/07/2021 0751 Last data filed at  03/07/2021 0600 Gross per 24 hour  Intake 1764.89 ml  Output 900 ml  Net 864.89 ml   Filed Weights   03/05/21 0411 03/06/21 0146 03/07/21 0359   Weight: 56.2 kg 56.2 kg 56.1 kg   Physical Exam: General: Critically ill-appearing, comatose HENT: [place], AT, OP clear, MMM Neck: Trach in place, thin clear secretions present Respiratory: Coarse breath sounds bilaterally Cardiovascular: RRR, -M/R/G, no JVD GI: BS+, soft, nontender Extremities:-Edema,-tenderness Neuro: Right pupil dilated and non-reactive, left pupil 5 mm and reactive, +cough/gag, intermittent rhythmic facial movements GU: Foley in place   Resp Cx 11/2 S.Aureus CT head 11/12 - neg CXR 11/12 - neg for acute abnormalities Bcx 11/12 - 1/4 S. 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 11/7 Anisocoria with right eye mydriasis - Without imaging findings revealing etiology, probably related t 11/12 and 11/13 - worsening dyskinesias +/- twitching overnight - Appreciate neurology input - QID clonazepam for dyskinesia - Avoid using Ativan and tramadol for dyskinesias per neuro - Cont Keppra and depakote  Intermittent fever, tachycardia May be related to encephalitis Consider sepsis in setting of immunosuppressed state due to rituximab. WBC and LA wnl. CXR unremarkable. Bcx 1/4 with S. 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  - Continue lung protective ventilation  - Routine bronchial hygiene. -Wean as mental status allows.  - VAP, PAD, Pulm hygiene. - Diurese for goal net negative daily  Acute pericarditis with small pericardial effusion.  - Appreciate Cardiology  input - cont colchicine for three month with plan for repeat echo after treatment   Anemia of critical illness S/p 1 unit PRBC - Trend CBC. - Transfuse for Hgb < 7.  Hashimoto thyroiditis.  - Continue Synthroid.   Hypokalemia - Replete potassium supplements and monitor  Best Practice (right click and "Reselect all SmartList Selections" daily)  Diet/type: tubefeeds DVT prophylaxis: LMWH GI prophylaxis: PPI Lines: Central line (PICC placed 11/1) Foley:  N/A Code Status:  full code Last date of multidisciplinary goals of care discussion -11/10, patient's mother was updated at bedside   The patient is critically ill with multiple organ systems failure and requires high complexity decision making for assessment and support, frequent evaluation and titration of therapies, application of advanced monitoring technologies and extensive interpretation of multiple databases.  Independent Critical Care Time: 40 Minutes.   Rodman Pickle, M.D. [facility] Pulmonary/Critical Care Medicine 03/07/2021 8:04 AM   Please see Amion for pager number to reach on-call Pulmonary and Critical Care Team.

## 2021-03-07 NOTE — Progress Notes (Signed)
[facility] Progress Note Patient Name: Etoile Looman DOB: May 12, 2000 MRN: [id number]   Date of Service  03/07/2021  HPI/Events of Note  Request for restraint renewal. On camera assessment patient remains intubated with risk of pulling lines and tubes.  eICU Interventions  Restraint renewed for tonight. Bedside team to assess in am for need for continued restraint.     Intervention Category Minor Interventions: Other:  Judd Lien 03/07/2021, 8:36 PM

## 2021-03-07 NOTE — Progress Notes (Addendum)
Subjective/interval events:  She continued to have episodes of dyskinetic movements overnight for which she received multiple doses of Ativan  ROS: Unable to obtain due to poor mental status   Examination  Vital signs in last 24 hours: Temp:  [97.6 F (36.4 C)-102.5 F (39.2 C)] 97.6 F (36.4 C) (11/13 0440) Pulse Rate:  [86-140] 88 (11/13 0600) Resp:  [16-35] 18 (11/13 0600) BP: (97-171)/(61-93) 124/80 (11/13 0600) SpO2:  [97 %-100 %] 100 % (11/13 0600) FiO2 (%):  [30 %] 30 % (11/13 0335) Weight:  [56.1 kg] 56.1 kg (11/13 0359)  General: lying in bed, NAD HEENT: She has some blinking movements of her eyes and pursing of her lips there appears to be dyskinetic movement CVS: pulse-normal rate and rhythm RS: +trach, coarse breath sounds bilaterally Extremities: normal, +edema  Neuro: Does not open eyes to noxious stimuli but does appear to be blinking, right pupil dilated but today reactive to light, 6 mm to 5 mm, left pupil reacting to light 95mm to 2 mm, disconjugate gaze but VOR intact, no apparent facial asymmetry on grimace to noxious stim, corneal reflex intact, intermittently moving bilateral upper extremities against gravity right slightly more briskly than left, slightly wiggles toes bilaterally to tickle Please see NP note on 11/12 for description of dyskinetic episodes which have been captured multiple times and EEG and clinically look quite concerning for seizure activity but has been documented to not be seizures.  In brief she will have rhythmic 1 to 2 Hz movements of all 4 extremities simultaneously with the right moving more than the left with eye blinking and lip pursing, associated tachycardia, tachypnea, hypertension  Basic Metabolic Panel: Recent Labs  Lab 03/03/21 0429 03/04/21 0459 03/05/21 0403 03/06/21 0155 03/06/21 0756 03/07/21 0612  NA 138 138 139  --  141 143  K 3.8 3.2* 3.2*  --  3.7 3.2*  CL 103 102 102  --  103 106  CO2 25 28 28   --  30 30  GLUCOSE  114* 119* 138*  --  99 126*  BUN 12 9 15   --  16 17  CREATININE 0.41* 0.33* <0.30*  --  0.41* 0.36*  CALCIUM 8.9 8.1* 9.0  --  9.2 9.1  MG 2.0 2.0 2.2 2.2  --  2.3  PHOS 3.3  --  2.5  --   --   --      CBC: Recent Labs  Lab 03/01/21 0414 03/02/21 0436 03/02/21 1614 03/03/21 0429 03/05/21 0403 03/06/21 0756  WBC 9.5 8.9  --  13.4* 9.1 9.2  NEUTROABS  --   --   --  10.2* 5.6  --   HGB 7.4* 7.0* 10.1* 10.3* 10.0* 9.4*  HCT 24.1* 22.8* 32.3* 31.8* 32.0* 30.8*  MCV 96.8 97.4  --  91.1 94.4 94.5  PLT 300 335  --  408* 437* 496*      Coagulation Studies: No results for input(s): LABPROT, INR in the last 72 hours.  Imaging Head CT personally reviewed, agree with radiology  Chest x-ray with some haziness in the left lung read as normal by radiology  Lactate 1.0 Procalcitonin undetectable, UA negative for infection, Blood cultures collected and pending  ASSESSMENT AND PLAN: 20 y.o. 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 intake for 4 days and decreased verbalization 10/15 prior to arrival. 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  She  has since completed 5 days of IV Solu-Medrol, 5 days of IVIG, 5 days of Plex and 1 dose of Rituxan on 03/04/2021.    Examination stable for me today, there was some concern for down and out right eye but I believe this is just disconjugate gaze due to sedation.  VOR is intact and her eye position varies at varied times during my examination, with improvement with stopping propofol.  Her pupils are still reactive for me.  Suspect that her episodes may be autonomic storming in the setting of her NMDA encephalitis which worsens her dyskinetic movements.  These seem to have worsened since stopping the tramadol on 10/10 and we will therefore try gabapentin today  Regarding alternative etiology of  fever (other than autonomic storming) infectious work-up is reassuring though we will continue to monitor.  Appreciate CCM assistance with this.  Given examination is stable/improving (return of right pupillary reflex 11/12), doubt CNS infection at this time.  However, if patient remains comatose we will discuss with ID again next week, and if there is further concern for worsening, repeat LP could be considered  Summary of work-up so far  Thyroperoxidase antibody 68 Thyroglobulin antibody 57.6 Lumbar puncture on 02/08/2021: 318 WBCs (94 lymphocytes, 0 neutrophils), total protein 50, glucose 68, fungal culture negative, negative HSV 1 and 2 DNA, negative VZV, EBV qualitative positive, CMV CSF negative, cryptococcal antigen negative, NMDA CSF  1:1280,  3 oligoclonal bands, IgG CSF 10.5, IgG/albumin ratio in CSF 0.40, CSF IgG index 1.4, CSF path showed lymphocytosis NMDA IgG  >1:2560, NMDA antibody titer 1:1000 Serum RPR negative, quantifying TB Gold negative EBV DNA by PCR <35 ANA positive dsDNA antibody negative Ribonucleoprotein negative  ENA SN negative Scleroderma antibody negative SSA 1.7 SSB negative  chromatin antibody negative  anti-Jo 1 antibody negative  centromere antibody screen negative JC virus DNA PCR, blood negative Protein electrophoresis, serum showed Albumin-ELP 2.7 (2.9-4.4) and gammaglobulin 2.6 ( 0.4-1.8) IgG immunoglobulin in serum 2748 ( [phone]) Hepatitis B surface antigen nonreactive Hepatitis B core antibody reactive Hepatitis B post: 856 Hepatitis C Antibody negative EBV VCA IgM normal, IgG >600 ( 0-17.9) and EBV NA IgG >600 (0-17.9)  Impression: NMDA encephalitis Acute encephalopathy, due to NMDA encephalitis and seizures New onset seizures Acute respiratory failure Dyskinesias Right eye mydriasis Recurrent fever Event concerning for seizure versus severe dyskinetic episode versus autonomic instability overnight  Recommendation -Continue Klonopin  0.5 mg at 0700, 1200, 1700 and continue Klonopin 1 mg nightly -Continue Depakote 500 mg every 8 hours and Keppra 1500 mg twice daily -S/p Vimpat 200 mg loading dose 11/12, hold on further vimpat at this time -Gabapentin 300 mg 3 times daily -Discontinue long-term monitor EEG -Morphine 2 mg q2hr as needed dyskinetic dyskinetic episodes lasting longer than 15 mins, or associated with heart rate sustained greater than 150 for 5 minutes or respiratory rate greater than 35 for 5 minutes or blood pressure greater than 200 for 5 minutes -Wean propofol and discontinue  -Discussed plan in detail patient's RN,  patient's mother at bedside, discussed with CCM via secure chat   I have spent a total of 50 minutes with the patient reviewing hospital notes,  test results, labs and examining the patient as well as establishing an assessment and plan.  > 50% of time was spent in direct patient care.   Lesleigh Noe MD-PhD [facility] [phone]   CRITICAL CARE Performed by: Lorenza Chick  Total critical care time: 50 minutes  Critical care time was  exclusive of separately billable procedures and treating other patients.  Critical care was necessary to treat or prevent imminent or life-threatening deterioration.  Critical care was time spent personally by me on the following activities: development of treatment plan with patient and/or surrogate as well as nursing, discussions with consultants, evaluation of patient's response to treatment, examination of patient, obtaining history from patient or surrogate, ordering and performing treatments and interventions, ordering and review of laboratory studies, ordering and review of radiographic studies, pulse oximetry and re-evaluation of patient's condition.

## 2021-03-07 NOTE — Progress Notes (Signed)
PHARMACY - PHYSICIAN COMMUNICATION CRITICAL VALUE ALERT - BLOOD CULTURE IDENTIFICATION (BCID)  Coti Hodges is an 20 y.o. female who presented to [facility] on [facility]/06/2021 presenting with seziure, intubated with encephalopathy d/t encephalitis/Status epi.  Recently completed tx for MSSA in trach aspirate Cx  Assessment:  1/4 staph epi (MecA +), likely contaminant, holding abx with fever/tachycardia likely from encephalitis  Name of physician (or Provider) Contacted: [place]  Current antibiotics: None  Changes to prescribed antibiotics recommended:  None  No results found for this or any previous visit.  Bertis Ruddy 03/07/2021  4:17 AM

## 2021-03-08 ENCOUNTER — Inpatient Hospital Stay (HOSPITAL_COMMUNITY): Payer: Medicaid Other

## 2021-03-08 DIAGNOSIS — J9601 Acute respiratory failure with hypoxia: Secondary | ICD-10-CM | POA: Diagnosis not present

## 2021-03-08 DIAGNOSIS — R609 Edema, unspecified: Secondary | ICD-10-CM | POA: Diagnosis not present

## 2021-03-08 DIAGNOSIS — G0490 Encephalitis and encephalomyelitis, unspecified: Secondary | ICD-10-CM | POA: Diagnosis not present

## 2021-03-08 LAB — CBC
HCT: 34 % — ABNORMAL LOW (ref 36.0–46.0)
Hemoglobin: 10.3 g/dL — ABNORMAL LOW (ref 12.0–15.0)
MCH: 28.9 pg (ref 26.0–34.0)
MCHC: 30.3 g/dL (ref 30.0–36.0)
MCV: 95.5 fL (ref 80.0–100.0)
Platelets: 511 10*3/uL — ABNORMAL HIGH (ref 150–400)
RBC: 3.56 MIL/uL — ABNORMAL LOW (ref 3.87–5.11)
RDW: 18 % — ABNORMAL HIGH (ref 11.5–15.5)
WBC: 10.9 10*3/uL — ABNORMAL HIGH (ref 4.0–10.5)
nRBC: 0 % (ref 0.0–0.2)

## 2021-03-08 LAB — CBC WITH DIFFERENTIAL/PLATELET
Abs Immature Granulocytes: 0 10*3/uL (ref 0.00–0.07)
Basophils Absolute: 0 10*3/uL (ref 0.0–0.1)
Basophils Relative: 0 %
Eosinophils Absolute: 0.2 10*3/uL (ref 0.0–0.5)
Eosinophils Relative: 2 %
HCT: 34 % — ABNORMAL LOW (ref 36.0–46.0)
Hemoglobin: 10.3 g/dL — ABNORMAL LOW (ref 12.0–15.0)
Lymphocytes Relative: 19 %
Lymphs Abs: 2 10*3/uL (ref 0.7–4.0)
MCH: 29.4 pg (ref 26.0–34.0)
MCHC: 30.3 g/dL (ref 30.0–36.0)
MCV: 97.1 fL (ref 80.0–100.0)
Monocytes Absolute: 0.7 10*3/uL (ref 0.1–1.0)
Monocytes Relative: 7 %
Neutro Abs: 7.7 10*3/uL (ref 1.7–7.7)
Neutrophils Relative %: 72 %
Platelets: 535 10*3/uL — ABNORMAL HIGH (ref 150–400)
RBC: 3.5 MIL/uL — ABNORMAL LOW (ref 3.87–5.11)
RDW: 18 % — ABNORMAL HIGH (ref 11.5–15.5)
WBC: 10.7 10*3/uL — ABNORMAL HIGH (ref 4.0–10.5)
nRBC: 0 % (ref 0.0–0.2)
nRBC: 0 /100 WBC

## 2021-03-08 LAB — BASIC METABOLIC PANEL
Anion gap: 8 (ref 5–15)
BUN: 24 mg/dL — ABNORMAL HIGH (ref 6–20)
CO2: 31 mmol/L (ref 22–32)
Calcium: 9.3 mg/dL (ref 8.9–10.3)
Chloride: 104 mmol/L (ref 98–111)
Creatinine, Ser: 0.37 mg/dL — ABNORMAL LOW (ref 0.44–1.00)
GFR, Estimated: >60 mL/min (ref >60)
Glucose, Bld: 102 mg/dL — ABNORMAL HIGH (ref 70–99)
Potassium: 3.9 mmol/L (ref 3.5–5.1)
Sodium: 143 mmol/L (ref 135–145)

## 2021-03-08 LAB — GLUCOSE, CAPILLARY
Glucose-Capillary: 103 mg/dL — ABNORMAL HIGH (ref 70–99)
Glucose-Capillary: 116 mg/dL — ABNORMAL HIGH (ref 70–99)
Glucose-Capillary: 129 mg/dL — ABNORMAL HIGH (ref 70–99)
Glucose-Capillary: 87 mg/dL (ref 70–99)
Glucose-Capillary: 92 mg/dL (ref 70–99)
Glucose-Capillary: 97 mg/dL (ref 70–99)

## 2021-03-08 LAB — CULTURE, BLOOD (ROUTINE X 2): Special Requests: ADEQUATE

## 2021-03-08 LAB — MISC LABCORP TEST (SEND OUT): Labcorp test code: 9985

## 2021-03-08 LAB — MAGNESIUM: Magnesium: 2.5 mg/dL — ABNORMAL HIGH (ref 1.7–2.4)

## 2021-03-08 MED ORDER — TRAMADOL HCL 50 MG PO TABS
50.0000 mg | ORAL_TABLET | Freq: Four times a day (QID) | ORAL | Status: DC
  Administered 2021-03-08 – 2021-03-13 (×19): 50 mg
  Filled 2021-03-08 (×19): qty 1

## 2021-03-08 NOTE — Progress Notes (Signed)
During patient care, pt was turned, trach became dislodged, trach reinserted, RT notified and immediately to bedside. O2 sat maintained at 100%. This RN notified RT that orders were given to remove trach sutures from trach today, sutures at 1200, this RN learned that sutures are not to be removed until after 7 days.  This RN was following orders per CCM.

## 2021-03-08 NOTE — Progress Notes (Addendum)
NAME:  Jamie [name], MRN:  [id number], DOB:  05-06-2000, LOS: 25 ADMISSION DATE:  02/06/2021, CONSULTATION DATE:  10/28 REFERRING MD:  Raymondo Band, CHIEF COMPLAINT:  Seizure, acute metabolic encephalopathy   History of Present Illness:  20 y/o female admitted with seizure and change in mental status.  She was treated with steroids and IVIg but despite this her condition worsened.  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  After treatment her mental status worsened and she developed inability to protect her airway in the setting of tachypnea and tachycardia.  She required intubation on 10/28, trach 11/8.   Pertinent  Medical History  Acquired autoimmune hypothyroidism  Significant Hospital Events: Including procedures, antibiotic start and stop dates in addition to other pertinent events   10/16 Admit 10/25 New onset seizure activity, PCCM consult 1026 pulmonary critical care signed off 02/19/2021 pulmonary critical care reconsulted for suspected respiratory distress.  intubated, continuous EEG started 10/30 starting plasmapheresis 10/31 MRI normal, EEG with GPD+ 11/1 RN reports pt seized while in MRI , additional agents added overnight.   11/5 completed 4th session of PLEX 11/7 due for 5/5 PLEX. Afternoon R blown pupil. No etiology on imaging.  11/8 trach 11/12 Rhythmic movements concerned for seizures, fever 102. Neuro evaluated for dyskinesia 11/13 Overnight continued to have dyskinetic movements associated with tachycardia. EEG with no seizure.   Micro/labs: 10/16 influenza AB PCR negative, SARS-CoV-2 PCR negative 10/16 HIV antibody negative 10/16 urine culture multiple species suggest recollection 10/16 urine drug screen negative, alcohol less than 10 10/17 CSF lymphocytic pleocytosis, fungal culture negative, CSF culture negative 10/17 CSF HSV 1 DNA negative, HSV-2 DNA negative, VZV PCR CSF negative 10/17 CSF cryptococcal antigen negative, cryptococcal  antigen titer not indicated 10/17 CSF EBV positive  10/17 CMV qualitative CSF negative 10/17 NMDA IgG on CSF > 1:2560 10/18 HIV RNA quant less than 20 10/20 NMDA IgG serum: positive 1:1000 10/22 RPR nonreactive 10/22 QuantiFERON gold intermediate 10/23 EBV DNA QuantiFERON by PCR positive, less than 35 10/25 hepatitis B surface antigen nonreactive, hepatitis B core total antibody positive, hepatitis B antibody positive, hepatitis B DNA not detected 10/25 hepatitis C antibody negative 10/26 blood culture no growth to date 11/2 Tracheal aspirate abundant MSSA   Imaging: 10/16 CT head negative 10/16 MRI brain incomplete study due to patient intolerance without evidence of acute intracranial abnormality 10/22 chest abdomen pelvis CT scan no acute intrathoracic abdominal or pelvic pathology, no evidence of malignancy 10/22 MRI brain normal MRI of the brain 10/26 pelvic ultrasound right ovary not visualized, normal appearance of left ovary and uterus, trace free fluid in pelvis 10/27 pelvic MRI: Normal pelvic MRI, symmetric normal ovaries, no adnexal masses 10/31 MRI > no acute infarct or hemorrhage. Normal MRI.  11/7 CT H no acute abnormality 11/7 MRI brain no acute process  11/13 EEG suggestive of moderate to severe diffuse encephalopathy, likely related to NMDA encephalitis. No seizures or epileptiform discharges   Procedures: Trach 11/8 >>  R IJ HD catheter 10/29 >>  RUE PICC 11/1 >>  Interim History / Subjective:  Afebrile  Staph epi in 1/4 blood cultures from 11/12, final pending  Vent needs stable, PEEP 5, FiO2 30% Glucose range 102-129 Remains on propofol.   RN reports pt received multiple doses of ativan and morphine overnight for abnormal movements - 100 mcg fentanyl, 8 mg ativan, 6mg  morphine given overnight + propofol infusion  Objective   Blood pressure (!) 87/59, pulse (!) 102, temperature 98.1 F (36.7  C), temperature source Axillary, resp. rate 18, height 5\' 2"   (1.575 m), weight 56.1 kg, last menstrual period 01/11/2021, SpO2 100 %.    Vent Mode: PRVC FiO2 (%):  [30 %] 30 % Set Rate:  [18 bmp] 18 bmp Vt Set:  [400 mL] 400 mL PEEP:  [5 cmH20] 5 cmH20 Plateau Pressure:  [14 cmH20-18 cmH20] 16 cmH20   Intake/Output Summary (Last 24 hours) at 03/08/2021 0908 Last data filed at 03/08/2021 0800 Gross per 24 hour  Intake 1642.21 ml  Output 1500 ml  Net 142.21 ml   Filed Weights   03/05/21 0411 03/06/21 0146 03/07/21 0359  Weight: 56.2 kg 56.2 kg 56.1 kg   Physical Exam: General: thin young adult female lying in bed in NAD on vent  HEENT: MM pink/moist, trach midline c/d/i, anicteric, right pupil 82mm, left 5mm / reactive   Neuro: sedate on vent, occasional twitching of lips/cheeks, feet noted CV: s1s2 RRR, tachy, no m/r/g PULM: non-labored at rest, lungs bilaterally clear GI: soft, bsx4 active  Extremities: warm/dry, no edema  Skin: no rashes or lesions  Assessment & Plan:   Anti NMDA Receptor Encephalitis 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  MRI pelvis neg for ovarian teratoma. R eye mydriasis 11/7 with negative imaging.  Intermittent dyskinsias with negative EEG.   -appreciate Neurology assistance with patient care -QID clonazepam for dyskinesia  -ativan, morphine, fentanyl PRN for dyskinesia > neurology will add back tramadol if further events off propofol -continue keppra, depakote  -follow neuro exam  -discontinue propofol infusion per Neurology request -discontinue R IJ HD cath, discussed with Neurology  -defer timing of rituxan / further dosing to Neurology  Intermittent fever, tachycardia May be related to encephalitis / neuro stom.  Consider sepsis in setting of immunosuppressed state due to rituximab.  WBC and LA wnl. CXR unremarkable. Bcx 1/4 with S. Epi, likely contaminant.  Completed cefazolin 11/10 for MSSA PNA.  -hold abx for now, monitor closely  -follow up cultures, anticipate staph epi contaminant given 1/4 bottles.  -will ask ID to see again with intermittent fevers, no clear source, ? Repeat LP need?.  If LP, will send antibodies again to see if decreasing.  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  Small pericardial effusion seen on ECHO 11/3, resolved 11/11 ECHO -appreciate Dr. Doylene Canard  -continue colchicine for 3 months with plan for follow up ECHO after treatment   Anemia of critical illness S/p 1 unit PRBC -follow CBC -transfuse for Hgb <7%   Hashimoto thyroiditis.  -continue synthroid   Hypokalemia -monitor, replace as indicated   Best Practice (right click and "Reselect all SmartList Selections" daily)  Diet/type: tubefeeds DVT prophylaxis: LMWH GI prophylaxis: PPI Lines: Central line (PICC placed 11/1) Foley:  N/A Code Status:  full code Last date of multidisciplinary goals of care discussion: no change in goals of care, full code.  Mother stepped out before am exam.  Will update on return 11/14.   Critical Care Time: 8 minutes    Noe Gens, MSN, APRN, NP-C, AGACNP-BC [facility] 03/08/2021, 9:11 AM   Please see [email or web address] for pager details.   From 7A-7P if no response, please call [phone] After  hours, please call [facility]Link [phone]

## 2021-03-08 NOTE — Progress Notes (Signed)
[facility]    Date of Admission:  02/06/2021   Total days of antibiotics 10     Principal Problem:   Encephalitis Active Problems:   Primary hypothyroidism   Catatonia associated with another mental disorder   Acute respiratory failure with hypoxia (HCC)   Status post tracheostomy [facility])   Assessment: 20 year old female born in [place] 16 years ago with Hashimoto's thyroiditis, admitted for encephalopathy and catatonia with floridly positive anti-NMDA antibodies in CSF(1:2560) consistent with anti-NMDA encephalitis. She ddi not respond to corticosteroids and developed seizures on IVIG. She underwent plasmapheresis and now started on Rituxan(1 dose on 03/04/21). 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 Tenafavir was started as pt had isolated HBV reactive as rituxan was started. ID re-engaged due to persistent fevers.   #Likely NMDA encephalitis SP corticosteroids, IVIG developing seizures->plasmapheresis x 5days->rituxanx1 dose -As autonomic dysfunction (including fever) is consistent with anti-nmda encephalitis. I agree with CSF titers as it may help correlate treatment response Recommendations:  -If LP is done would add MTB NAAT in addition to NMDA titer, HSV, EBV(repeat) -Repeat CXR -Will continue to follow  #MSSA VAP  -Treated with 10 days of appropriate antibiotics, vancomycin ->cefazolin -Repeat CXR has been clear on 11/12  #IGRA was indeterminate  on 10/22 in the setting of IVIG -Repeat IGRA on 02/20/21 negative.  #1/4 bottle + Staph epi-consistent with contamination -hold antibiotics   Microbiology:    Antibiotics: Vancomycin 11/2-4 Cefazolin 11/3-11/10  Cultures: Blood Cx: 11/12 1/4 bottles Staph epi Resp Cx: trach aspirate 11/02 MSSA Subjective: Pt is resting in bed, LE are shaking. She is unable to provide history.     Review of Systems: Review of Systems  Unable to perform ROS: Intubated   Past Medical History:  Diagnosis Date   Acquired autoimmune hypothyroidism    Dx 12/2014, TSH 110, FT4 0.3    Social History   Tobacco Use   Smoking status: Never   Smokeless tobacco: Never  Substance Use Topics   Alcohol use: Not Currently   Drug use: Not Currently    Family History  Problem Relation Age of Onset   Healthy Mother    Healthy Father    Scheduled Meds:  chlorhexidine gluconate (MEDLINE KIT)  15 mL Mouth Rinse BID   Chlorhexidine Gluconate Cloth  6 each Topical Daily   clonazePAM  0.5 mg Per Tube TID   clonazepam  1 mg Per Tube QHS   colchicine  0.6 mg Per Tube Daily   docusate  100 mg Per Tube BID   enoxaparin (LOVENOX) injection  40 mg Subcutaneous Q24H   feeding supplement (PROSource TF)  45 mL Per Tube TID   gabapentin  300 mg Per Tube Q8H   levothyroxine  100 mcg Per Tube Q0600   mouth rinse  15 mL Mouth Rinse 10 times per day   metoprolol tartrate  25 mg Per Tube TID   pantoprazole sodium  40 mg Per Tube QHS   polyethylene glycol  17 g Per Tube BID   scopolamine  1 patch Transdermal Q72H   senna-docusate  2 tablet Per NG tube BID   sodium chloride flush  10-40 mL Intracatheter Q12H   tenofovir  300 mg Per Tube Daily   thiamine  100 mg Per Tube Daily   traMADol  50 mg Per Tube Q6H  valproic acid  500 mg Per Tube Q8H   Continuous Infusions:  sodium chloride 10 mL/hr at 03/08/21 0800   famotidine (PEPCID) IV     feeding supplement (VITAL 1.5 CAL) 1,000 mL (03/08/21 0630)   levETIRAcetam 1,500 mg (03/08/21 1111)   sodium chloride     PRN Meds:.albuterol, atropine, diphenhydrAMINE, famotidine (PEPCID) IV, fentaNYL (SUBLIMAZE) injection, LORazepam, morphine injection, sodium chloride, sodium chloride flush No Known Allergies  OBJECTIVE: Blood pressure 92/65, pulse (!) 112, temperature (!) 96.9 F (36.1 C), temperature source Axillary, resp. rate 18, height 5' 2"   (1.575 m), weight 56.1 kg, last menstrual period 01/11/2021, SpO2 99 %.  Physical Exam Constitutional:      Comments: Trach, shaking b/l LE, does not repond to commands  HENT:     Head: Normocephalic and atraumatic.     Right Ear: External ear normal.     Left Ear: External ear normal.     Nose: Nose normal.     Mouth/Throat:     Mouth: Mucous membranes are moist.  Eyes:     Extraocular Movements: Extraocular movements intact.     Conjunctiva/sclera: Conjunctivae normal.     Pupils: Pupils are equal, round, and reactive to light.  Cardiovascular:     Rate and Rhythm: Normal rate and regular rhythm.     Heart sounds: No murmur heard.   No friction rub. No gallop.  Pulmonary:     Effort: Pulmonary effort is normal.     Breath sounds: Normal breath sounds.  Abdominal:     General: Abdomen is flat.     Palpations: Abdomen is soft.  Skin:    General: Skin is warm and dry.     Comments: 1 inch marks from chest to middle of lower extremities  Neurological:     General: No focal deficit present.     Mental Status: She is oriented to person, place, and time.  Psychiatric:        Mood and Affect: Mood normal.    Lab Results Lab Results  Component Value Date   WBC 10.9 (H) 03/08/2021   WBC 10.7 (H) 03/08/2021   HGB 10.3 (L) 03/08/2021   HGB 10.3 (L) 03/08/2021   HCT 34.0 (L) 03/08/2021   HCT 34.0 (L) 03/08/2021   MCV 95.5 03/08/2021   MCV 97.1 03/08/2021   PLT 511 (H) 03/08/2021   PLT 535 (H) 03/08/2021    Lab Results  Component Value Date   CREATININE 0.37 (L) 03/08/2021   BUN 24 (H) 03/08/2021   NA 143 03/08/2021   K 3.9 03/08/2021   CL 104 03/08/2021   CO2 31 03/08/2021    Lab Results  Component Value Date   ALT 11 03/06/2021   AST 21 03/06/2021   ALKPHOS 32 (L) 03/06/2021   BILITOT 0.3 03/06/2021       Laurice Record, [place] 03/08/2021, 2:15 PM

## 2021-03-08 NOTE — Progress Notes (Signed)
Subjective: On propofol due to repeated prolonged episodes of facial and arm twitching as well as tachycardia.   ROS: Unable to obtain due to poor mental status  Examination  Vital signs in last 24 hours: Temp:  [97.7 F (36.5 C)-100.7 F (38.2 C)] 98.1 F (36.7 C) (11/14 0804) Pulse Rate:  [94-171] 112 (11/14 1102) Resp:  [18-47] 18 (11/14 0800) BP: (87-125)/(56-81) 87/59 (11/14 0800) SpO2:  [93 %-100 %] 100 % (11/14 0812) FiO2 (%):  [30 %] 30 % (11/14 0812)  General: lying in bed, NAD CVS: pulse-normal rate and rhythm RS: +trach, coarse breath sounds bilaterally Extremities: normal, +edema  Neuro: On propofol @10mcg /hr, Does not open eyes to noxious stimuli, right pupil dilated and  NOT reacting to light, left pupil sluggishly reacting to light, no apparent facial asymmetry, corneal reflex intact, did not withdraw to noxious stimuli in all extremities.    Basic Metabolic Panel: Recent Labs  Lab 03/03/21 0429 03/04/21 0459 03/05/21 0403 03/06/21 0155 03/06/21 0756 03/07/21 0612 03/07/21 2014 03/08/21 0506  NA 138 138 139  --  141 143 143 143  K 3.8 3.2* 3.2*  --  3.7 3.2* 4.4 3.9  CL 103 102 102  --  103 106 104 104  CO2 25 28 28   --  30 30 30 31   GLUCOSE 114* 119* 138*  --  99 126* 112* 102*  BUN 12 9 15   --  16 17 25* 24*  CREATININE 0.41* 0.33* <0.30*  --  0.41* 0.36* 0.44 0.37*  CALCIUM 8.9 8.1* 9.0  --  9.2 9.1 9.6 9.3  MG 2.0 2.0 2.2 2.2  --  2.3  --  2.5*  PHOS 3.3  --  2.5  --   --   --   --   --     CBC: Recent Labs  Lab 03/02/21 0436 03/02/21 1614 03/03/21 0429 03/05/21 0403 03/06/21 0756 03/08/21 0506  WBC 8.9  --  13.4* 9.1 9.2 10.9*  NEUTROABS  --   --  10.2* 5.6  --   --   HGB 7.0* 10.1* 10.3* 10.0* 9.4* 10.3*  HCT 22.8* 32.3* 31.8* 32.0* 30.8* 34.0*  MCV 97.4  --  91.1 94.4 94.5 95.5  PLT 335  --  408* 437* 496* 511*     Coagulation Studies: No results for input(s): LABPROT, INR in the last 72 hours.  Imaging No new brain imaging  overnight   ASSESSMENT AND PLAN:  20 y.o. 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 intake for 4 days and decreased verbalization 10/15 prior to arrival. 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  She has since completed 5 days of IV Solu-Medrol, 5 days of IVIG, 5 days of Plex and 1 dose of Rituxan on 03/04/2021.     Summary of work-up so far       Thyroperoxidase antibody 68 Thyroglobulin antibody 57.6 Lumbar puncture on 02/08/2021: 318 WBCs (94 lymphocytes, 0 neutrophils), total protein 50, glucose 68, fungal culture negative, negative HSV 1 and 2 DNA, negative VZV, EBV qualitative positive, CMV CSF negative, cryptococcal antigen negative, NMDA CSF  1:1280,  3 oligoclonal bands, IgG CSF 10.5, IgG/albumin ratio in CSF 0.40, CSF IgG index 1.4, CSF path showed lymphocytosis NMDA IgG  >1:2560, NMDA antibody titer 1:1000 Serum RPR negative, quantifying TB Gold negative EBV DNA by PCR <35 ANA positive dsDNA antibody negative Ribonucleoprotein negative  ENA SN negative Scleroderma antibody negative SSA 1.7 SSB negative  chromatin antibody negative  anti-Jo 1 antibody negative  centromere antibody screen negative JC virus DNA PCR, blood negative Protein electrophoresis, serum showed Albumin-ELP 2.7 (2.9-4.4) and gammaglobulin 2.6 ( 0.4-1.8) IgG immunoglobulin in serum 2748 ( 676-1950) Hepatitis B surface antigen nonreactive Hepatitis B core antibody reactive Hepatitis B post: 856 Hepatitis C Antibody negative EBV VCA IgM normal, IgG >600 ( 0-17.9) and EBV NA IgG >600 (0-17.9)     NMDA encephalitis Acute encephalopathy, due to NMDA encephalitis and seizures New onset seizures Acute respiratory failure Dyskinesias Right eye mydriasis Recurrent fever -No clinical seizures.  Does have episodes of dyskinesias.    Recommendation -Recommend weaning off of propofol to minimize sedation.  If episodes of dyskinesia worsen, can consider resuming tramadol. -Continue Klonopin 0.5 mg at 0700, 1200, 1700 and continue Klonopin 1 mg nightly, Depakote 500 mg every 8 hours and Keppra 1500 mg twice daily -Also on gabapentin 300 mg 3 times daily. -Recurrent fever could be due to NMDA encephalitis.  However, will discuss with critical care and possibly reconsult ID   I have spent a total of 36 minutes with the patient reviewing hospital notes,  test results, labs and examining the patient as well as establishing an assessment and plan.  > 50% of time was spent in direct patient care.  Zeb Comfort Epilepsy [facility] For questions after 5pm please refer to AMION to reach the Neurologist on call

## 2021-03-08 NOTE — Progress Notes (Signed)
SLP Cancellation Note  Patient Details Name: Jamie [name] MRN: [id number] DOB: March 17, 2001   Cancelled treatment:        Reason eval/treat not completed: Pt sedated, not appropriate for evaluation. SLP will continue to follow for readiness.   Dewitt Rota, SLP-Student   Gilman Anisha Starliper 03/08/2021, 10:29 AM

## 2021-03-08 NOTE — Progress Notes (Signed)
RT NOTE: RT called by RN due to patient dislodging trach. Upon arrival, RN had placed patient's trach back in position and secured. RT placed ETCO2 detector with good color change. Bilateral breath sounds noted and vitals stable with sats of 100%. Some bleeding noted around trach site, RT placed drain gauze loosely around trach. Vitals are stable. RT will continue to monitor.

## 2021-03-08 NOTE — Progress Notes (Signed)
Nutrition Follow-up  DOCUMENTATION CODES:   Not applicable  INTERVENTION:   Tube feeding via Cortrak tube:  Vital 1.5 @ 55 ml/h  (1320 ml per day) Prosource TF 45 ml TID  Provides 2100 kcal, 122 gm protein, 1003 ml free water daily   NUTRITION DIAGNOSIS:   Inadequate oral intake related to acute illness (pt with AMS) as evidenced by meal completion < 25%. Ongoing.   GOAL:   Patient will meet greater than or equal to 90% of their needs Met with TF at goal   MONITOR:   Vent status, TF tolerance, Skin, Weight trends, Labs, I & O's  REASON FOR ASSESSMENT:   Consult Enteral/tube feeding initiation and management  ASSESSMENT:   20 y.o. female with a PMHx of Hashimoto thyroiditis who is admitted with acute encephalopathy and catatonia.  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  Cardiology following for acute pericarditis   During admission pt has had very poor nutrition due to tube in and out, and ileus.   10/16 admitted 10/21 cortrak placed; tip gastric  10/25 seizures; cortrak out 10/26 cortrak placed; tip gastric 10/27 cortrak out  10/28 intubated; OG placed  10/30 PLEX 11/1 seizures; PLEX 11/3 PLEX; pt with emesis episode due to vagal response; KUB with colonic distention measuring up to 6.3 cm possible ileus; OG with tip in stomach  11/6 TF to 40 11/7 TF to goal 11/8 TF held for trach at 2 pm 11/9 cortrak placed; tip gastric   Patient is currently intubated on ventilator support MV: 7.2 L/min Temp (24hrs), Avg:98.7 F (37.1 C), Min:96.9 F (36.1 C), Max:100.7 F (38.2 C)   Medications reviewed and include: colace, synthroid, protonix, miralax, senokot-s, thiamine   Labs reviewed CBG's: 96-129  UOP: 21000 ml Stool 400 ml   Current TF:  Vital 1.5 @ 55 ml/h with Prosource TF 45 ml daily Provides 2020 kcal, 100 gm protein  Diet Order:   Diet Order     None       EDUCATION NEEDS:   No education needs have  been identified at this time  Skin:  Skin Assessment: Reviewed RN Assessment  Last BM:  400 ml  Height:   Ht Readings from Last 1 Encounters:  02/19/21 5' 2"  (1.575 m) (18 %, Z= -0.90)*   * Growth percentiles are based on CDC (Girls, 2-20 Years) data.    Weight:   Wt Readings from Last 1 Encounters:  03/07/21 56.1 kg (42 %, Z= -0.19)*   * Growth percentiles are based on CDC (Girls, 2-20 Years) data.    Ideal Body Weight:  50 kg  BMI:  Body mass index is 22.62 kg/m.  Estimated Nutritional Needs:   Kcal:  2000  Protein:  90-110 grams  Fluid:  >/= 1.5 L/day  Lockie Pares., RD, LDN, CNSC See AMiON for contact information

## 2021-03-08 NOTE — Progress Notes (Signed)
Bilateral lower extremity venous duplex has been completed. Preliminary results can be found in CV Proc through chart review.   03/08/21 2:14 PM Jamie [name] RVT

## 2021-03-09 DIAGNOSIS — J9601 Acute respiratory failure with hypoxia: Secondary | ICD-10-CM | POA: Diagnosis not present

## 2021-03-09 DIAGNOSIS — G0490 Encephalitis and encephalomyelitis, unspecified: Secondary | ICD-10-CM | POA: Diagnosis not present

## 2021-03-09 LAB — CBC
HCT: 30 % — ABNORMAL LOW (ref 36.0–46.0)
Hemoglobin: 9.1 g/dL — ABNORMAL LOW (ref 12.0–15.0)
MCH: 28.8 pg (ref 26.0–34.0)
MCHC: 30.3 g/dL (ref 30.0–36.0)
MCV: 94.9 fL (ref 80.0–100.0)
Platelets: 423 10*3/uL — ABNORMAL HIGH (ref 150–400)
RBC: 3.16 MIL/uL — ABNORMAL LOW (ref 3.87–5.11)
RDW: 17.8 % — ABNORMAL HIGH (ref 11.5–15.5)
WBC: 10.5 10*3/uL (ref 4.0–10.5)
nRBC: 0 % (ref 0.0–0.2)

## 2021-03-09 LAB — BASIC METABOLIC PANEL
Anion gap: 11 (ref 5–15)
BUN: 25 mg/dL — ABNORMAL HIGH (ref 6–20)
CO2: 28 mmol/L (ref 22–32)
Calcium: 9.1 mg/dL (ref 8.9–10.3)
Chloride: 101 mmol/L (ref 98–111)
Creatinine, Ser: 0.38 mg/dL — ABNORMAL LOW (ref 0.44–1.00)
GFR, Estimated: >60 mL/min (ref >60)
Glucose, Bld: 92 mg/dL (ref 70–99)
Potassium: 3.1 mmol/L — ABNORMAL LOW (ref 3.5–5.1)
Sodium: 140 mmol/L (ref 135–145)

## 2021-03-09 LAB — GLUCOSE, CAPILLARY
Glucose-Capillary: 102 mg/dL — ABNORMAL HIGH (ref 70–99)
Glucose-Capillary: 116 mg/dL — ABNORMAL HIGH (ref 70–99)
Glucose-Capillary: 137 mg/dL — ABNORMAL HIGH (ref 70–99)
Glucose-Capillary: 68 mg/dL — ABNORMAL LOW (ref 70–99)
Glucose-Capillary: 82 mg/dL (ref 70–99)
Glucose-Capillary: 88 mg/dL (ref 70–99)
Glucose-Capillary: 88 mg/dL (ref 70–99)

## 2021-03-09 LAB — MAGNESIUM: Magnesium: 2.2 mg/dL (ref 1.7–2.4)

## 2021-03-09 MED ORDER — FENTANYL CITRATE PF 50 MCG/ML IJ SOSY
50.0000 ug | PREFILLED_SYRINGE | INTRAMUSCULAR | Status: DC | PRN
  Administered 2021-03-10 (×4): 50 ug via INTRAVENOUS
  Filled 2021-03-09 (×4): qty 1

## 2021-03-09 MED ORDER — LACTATED RINGERS IV BOLUS
500.0000 mL | Freq: Once | INTRAVENOUS | Status: AC
  Administered 2021-03-09: 500 mL via INTRAVENOUS

## 2021-03-09 MED ORDER — LORAZEPAM 2 MG/ML IJ SOLN
0.5000 mg | INTRAMUSCULAR | Status: AC | PRN
  Administered 2021-03-10: 2 mg via INTRAVENOUS
  Filled 2021-03-09: qty 1

## 2021-03-09 MED ORDER — POTASSIUM CHLORIDE 20 MEQ PO PACK
20.0000 meq | PACK | ORAL | Status: AC
  Administered 2021-03-09 (×2): 20 meq
  Filled 2021-03-09 (×2): qty 1

## 2021-03-09 MED ORDER — POTASSIUM CHLORIDE 10 MEQ/50ML IV SOLN
10.0000 meq | INTRAVENOUS | Status: AC
  Administered 2021-03-09 (×4): 10 meq via INTRAVENOUS
  Filled 2021-03-09 (×4): qty 50

## 2021-03-09 NOTE — Progress Notes (Addendum)
Subjective: Had prolonged episode of oropharyngeal and upper extremity dyskinesias during patient care yesterday requiring multiple doses of Ativan, morphine and eventually fentanyl.  No further episodes this morning per RN and mother at bedside.  ROS: Unable to obtain due to poor mental status  Examination  Vital signs in last 24 hours: Temp:  [97 F (36.1 C)-99.8 F (37.7 C)] 98.6 F (37 C) (11/15 1200) Pulse Rate:  [82-117] 111 (11/15 1147) Resp:  [17-33] 18 (11/15 1136) BP: (88-148)/(53-93) 108/67 (11/15 1147) SpO2:  [96 %-100 %] 100 % (11/15 1136) FiO2 (%):  [30 %] 30 % (11/15 1136)  General: lying in bed, NAD CVS: pulse-normal rate and rhythm RS: +trach, coarse breath sounds bilaterally Extremities: normal, +edema  Neuro:  Does not open eyes to noxious stimuli, right pupil dilated and reacting to light, left pupil sluggishly reacting to light, no apparent facial asymmetry, corneal reflex intact, did not withdraw to noxious stimuli in all extremities.     Basic Metabolic Panel: Recent Labs  Lab 03/03/21 0429 03/04/21 0459 03/05/21 0403 03/06/21 0155 03/06/21 0756 03/07/21 0612 03/07/21 2014 03/08/21 0506 03/09/21 0352  NA 138   < > 139  --  141 143 143 143 140  K 3.8   < > 3.2*  --  3.7 3.2* 4.4 3.9 3.1*  CL 103   < > 102  --  103 106 104 104 101  CO2 25   < > 28  --  30 30 30 31 28   GLUCOSE 114*   < > 138*  --  99 126* 112* 102* 92  BUN 12   < > 15  --  16 17 25* 24* 25*  CREATININE 0.41*   < > <0.30*  --  0.41* 0.36* 0.44 0.37* 0.38*  CALCIUM 8.9   < > 9.0  --  9.2 9.1 9.6 9.3 9.1  MG 2.0   < > 2.2 2.2  --  2.3  --  2.5* 2.2  PHOS 3.3  --  2.5  --   --   --   --   --   --    < > = values in this interval not displayed.    CBC: Recent Labs  Lab 03/03/21 0429 03/05/21 0403 03/06/21 0756 03/08/21 0506 03/09/21 0352  WBC 13.4* 9.1 9.2 10.7*  10.9* 10.5  NEUTROABS 10.2* 5.6  --  7.7  --   HGB 10.3* 10.0* 9.4* 10.3*  10.3* 9.1*  HCT 31.8* 32.0* 30.8*  34.0*  34.0* 30.0*  MCV 91.1 94.4 94.5 97.1  95.5 94.9  PLT 408* 437* 496* 535*  511* 423*     Coagulation Studies: No results for input(s): LABPROT, INR in the last 72 hours.  Imaging No new brain imaging overnight   ASSESSMENT AND PLAN: 20 y.o. 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 intake for 4 days and decreased verbalization 10/15 prior to arrival. 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  She has since completed 5 days of IV Solu-Medrol, 5 days of IVIG, 5 days of Plex and 1 dose of Rituxan on 03/04/2021.     Summary of work-up so far    Thyroperoxidase antibody 68 Thyroglobulin antibody 57.6 Lumbar puncture on 02/08/2021: 318 WBCs (94 lymphocytes, 0 neutrophils), total protein 50, glucose 68, fungal culture negative, negative HSV 1 and 2 DNA, negative VZV, EBV qualitative positive, CMV CSF negative,  cryptococcal antigen negative, NMDA CSF  1:1280,  3 oligoclonal bands, IgG CSF 10.5, IgG/albumin ratio in CSF 0.40, CSF IgG index 1.4, CSF path showed lymphocytosis NMDA IgG  >1:2560, NMDA antibody titer 1:1000 Serum RPR negative, quantifying TB Gold negative EBV DNA by PCR <35 ANA positive dsDNA antibody negative Ribonucleoprotein negative  ENA SN negative Scleroderma antibody negative SSA 1.7 SSB negative  chromatin antibody negative  anti-Jo 1 antibody negative  centromere antibody screen negative JC virus DNA PCR, blood negative Protein electrophoresis, serum showed Albumin-ELP 2.7 (2.9-4.4) and gammaglobulin 2.6 ( 0.4-1.8) IgG immunoglobulin in serum 2748 ( 161-0960) Hepatitis B surface antigen nonreactive Hepatitis B core antibody reactive Hepatitis B post: 856 Hepatitis C Antibody negative EBV VCA IgM normal, IgG >600 ( 0-17.9) and EBV NA IgG >600 (0-17.9)     NMDA encephalitis Acute encephalopathy, due  to NMDA encephalitis and seizures New onset seizures Acute respiratory failure Dyskinesias Right eye mydriasis Recurrent fever Sympathetic storming -No clinical seizures.  Dyskinesias have improved since resuming tramadol  Recommendation -Plan for repeat lumbar puncture, will check for infectious work-up as recommended by ID and again check for CSF NMDA as well as serum NMDA antibodies -Continue Klonopin 0.5 mg at 0700, 1200, 1700 and continue Klonopin 1 mg nightly, Depakote 500 mg every 8 hours and Keppra 1500 mg twice daily -Also on gabapentin 300 mg 3 times daily. -As needed fentanyl for orofacial and bilateral upper extremity dyskinesias lasting more than 15 minutes and associated with heart rate more than 454, systolic blood pressure more than 160 -We will discuss with pharmacy regarding second dose of rituximab later this week or early next week. -Plan discussed in detail with patient's mother at bedside, RN and ICU team.    CRITICAL CARE Performed by: Lora Havens  Total critical care time: 35 minutes  Critical care time was exclusive of separately billable procedures and treating other patients.  Critical care was necessary to treat or prevent imminent or life-threatening deterioration.  Critical care was time spent personally by me on the following activities: development of treatment plan with patient and/or surrogate as well as nursing, discussions with consultants, evaluation of patient's response to treatment, examination of patient, obtaining history from patient or surrogate, ordering and performing treatments and interventions, ordering and review of laboratory studies, ordering and review of radiographic studies, pulse oximetry and re-evaluation of patient's condition.   Zeb Comfort Epilepsy [facility] For questions after 5pm please refer to AMION to reach the Neurologist on call

## 2021-03-09 NOTE — Progress Notes (Signed)
[facility] ADULT ICU REPLACEMENT PROTOCOL   The patient does apply for the [facility] Adult ICU Electrolyte Replacment Protocol based on the criteria listed below:   1.Exclusion criteria: TCTS patients, ECMO patients, and Dialysis patients 2. Is GFR >/= 30 ml/min? Yes.    Patient's GFR today is >60 3. Is SCr </= 2? Yes.   Patient's SCr is 0.38 mg/dL 4. Did SCr increase >/= 0.5 in 24 hours? No. 5.Pt's weight >40kg  Yes.   6. Abnormal electrolyte(s):  K 3.1  7. Electrolytes replaced per protocol 8.  Call MD STAT for K+ </= 2.5, Phos </= 1, or Mag </= 1 Physician:  Lowella Bandy R Vayda Dungee 03/09/2021 6:01 AM

## 2021-03-09 NOTE — Progress Notes (Signed)
SBP 90s with MAP of 66. Notified eLink.

## 2021-03-09 NOTE — Progress Notes (Signed)
NAME:  Jamie [name], MRN:  [id number], DOB:  02-07-2001, LOS: 48 ADMISSION DATE:  02/06/2021, CONSULTATION DATE:  10/28 REFERRING MD:  Raymondo Band, CHIEF COMPLAINT:  Seizure, acute metabolic encephalopathy   History of Present Illness:  20 y/o female admitted with seizure and change in mental status.  She was treated with steroids and IVIg but despite this her condition worsened.  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  After treatment her mental status worsened and she developed inability to protect her airway in the setting of tachypnea and tachycardia.  She required intubation on 10/28, trach 11/8.   Pertinent  Medical History  Acquired autoimmune hypothyroidism  Significant Hospital Events: Including procedures, antibiotic start and stop dates in addition to other pertinent events   10/16 Admit 10/25 New onset seizure activity, PCCM consult 1026 pulmonary critical care signed off 02/19/2021 pulmonary critical care reconsulted for suspected respiratory distress.  intubated, continuous EEG started 10/30 starting plasmapheresis 10/31 MRI normal, EEG with GPD+ 11/1 RN reports pt seized while in MRI , additional agents added overnight.   11/5 completed 4th session of PLEX 11/7 due for 5/5 PLEX. Afternoon R blown pupil. No etiology on imaging.  11/8 trach 11/12 Rhythmic movements concerned for seizures, fever 102. Neuro evaluated for dyskinesia 11/13 Overnight continued to have dyskinetic movements associated with tachycardia. EEG with no seizure.   Micro/labs: 10/16 influenza AB PCR negative, SARS-CoV-2 PCR negative 10/16 HIV antibody negative 10/16 urine culture multiple species suggest recollection 10/16 urine drug screen negative, alcohol less than 10 10/17 CSF lymphocytic pleocytosis, fungal culture negative, CSF culture negative 10/17 CSF HSV 1 DNA negative, HSV-2 DNA negative, VZV PCR CSF negative 10/17 CSF cryptococcal antigen negative, cryptococcal  antigen titer not indicated 10/17 CSF EBV positive  10/17 CMV qualitative CSF negative 10/17 NMDA IgG on CSF > 1:2560 10/18 HIV RNA quant less than 20 10/20 NMDA IgG serum: positive 1:1000 10/22 RPR nonreactive 10/22 QuantiFERON gold intermediate 10/23 EBV DNA QuantiFERON by PCR positive, less than 35 10/25 hepatitis B surface antigen nonreactive, hepatitis B core total antibody positive, hepatitis B antibody positive, hepatitis B DNA not detected 10/25 hepatitis C antibody negative 10/26 blood culture no growth to date 11/2 Tracheal aspirate abundant MSSA   Imaging: 10/16 CT head negative 10/16 MRI brain incomplete study due to patient intolerance without evidence of acute intracranial abnormality 10/22 chest abdomen pelvis CT scan no acute intrathoracic abdominal or pelvic pathology, no evidence of malignancy 10/22 MRI brain normal MRI of the brain 10/26 pelvic ultrasound right ovary not visualized, normal appearance of left ovary and uterus, trace free fluid in pelvis 10/27 pelvic MRI: Normal pelvic MRI, symmetric normal ovaries, no adnexal masses 10/31 MRI > no acute infarct or hemorrhage. Normal MRI.  11/7 CT H no acute abnormality 11/7 MRI brain no acute process  11/13 EEG suggestive of moderate to severe diffuse encephalopathy, likely related to NMDA encephalitis. No seizures or epileptiform discharges  11/14 Doppler ultrasound lower extremities negative for DVT  Procedures: Trach 11/8 >>  R IJ HD catheter 10/29 >>  RUE PICC 11/1 >>  Interim History / Subjective:  Off on 11/14 Trach sutures removed 11/14, had an episode where trach was dislodged, easily replaced.  Associated with agitation and required pushes of sedatives.   Objective   Blood pressure 101/63, pulse 94, temperature 97.9 F (36.6 C), temperature source Axillary, resp. rate 18, height 5\' 2"  (1.575 m), weight 56.1 kg, last menstrual period 01/11/2021, SpO2 99 %.  Vent Mode: PRVC FiO2 (%):  [30 %] 30  % Set Rate:  [18 bmp] 18 bmp Vt Set:  [400 mL] 400 mL PEEP:  [5 cmH20] 5 cmH20 Plateau Pressure:  [9 cmH20-17 cmH20] 17 cmH20   Intake/Output Summary (Last 24 hours) at 03/09/2021 1037 Last data filed at 03/09/2021 0800 Gross per 24 hour  Intake 1346.73 ml  Output 1100 ml  Net 246.73 ml   Filed Weights   03/05/21 0411 03/06/21 0146 03/07/21 0359  Weight: 56.2 kg 56.2 kg 56.1 kg   Physical Exam: General: Young ill-appearing woman, sedated on MV HEENT: Tracheostomy tube in place, oropharynx otherwise clear.  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  MRI pelvis neg for ovarian teratoma. R eye mydriasis 11/7 with negative imaging.  Intermittent dyskinsias with negative EEG.   -Trelegy assistance in management -Propofol discontinued on 11/14, appears to be tolerating -Continue Keppra, Depakote, scheduled clonazepam as ordered -Considering possible repeat LP to reassess EBV, NMDA antibody titers. Also check MTB NAAT, HSV -? Timing of next dose rituximab.  Will discuss with neurology  Intermittent fever, tachycardia Suspect related to her ongoing encephalitis.  Appreciate ID reassessment on 11/14.  No clear evidence for active infection.  Blood culture 1 of 4 staph epidermidis, likely contaminant. -Appreciate ID input, following off antibiotics currently -Planning for repeat LP to  assess NMDA antibodies, other possible causes. 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  Not a candidate for decannulation -VAP prevention order set -Has a scopolamine patch for increased oral secretions   Acute Pericarditis with small Pericardial Effusion.  Small pericardial effusion seen on ECHO 11/3, resolved 11/11 ECHO -Appreciate Dr. Merrilee Jansky assistance.  Plan to continue colchicine for 3 months, follow-up echocardiogram at that time   Anemia of critical illness S/p 1 unit PRBC -Follow CBC -transfuse for Hgb <7%   Hashimoto thyroiditis.  -On levothyroxine  Hypokalemia -Monitoring, follow as indicated  Best Practice (right click and "Reselect all SmartList Selections" daily)  Diet/type: tubefeeds DVT prophylaxis: LMWH GI prophylaxis: PPI Lines: Central line (PICC placed 11/1) Foley:  N/A Code Status:  full code Last date of multidisciplinary goals of care discussion: no change in goals of care, full code.  Updating mother daily  Critical Care Time: 3 minutes     Baltazar Apo, MD, PhD 03/09/2021, 10:51 AM [facility] [phone] or if no answer before 7:00PM call [phone] For any issues after 7:00PM please call eLink [phone]

## 2021-03-09 NOTE — Progress Notes (Signed)
[facility] Progress Note Patient Name: Jamie [name] DOB: Sep 02, 2000 MRN: [id number]   Date of Service  03/09/2021  HPI/Events of Note  Patient with urinary retention, bladder scan shows 438 ml of urine.  eICU Interventions  Foley catheter protocol ordered.        [name] Tashai Catino 03/09/2021, 10:57 PM

## 2021-03-09 NOTE — Progress Notes (Signed)
[facility] for Infectious Disease    Date of Admission:  02/06/2021   Total days of antibiotics 11     Principal Problem:   Encephalitis Active Problems:   Primary hypothyroidism   Catatonia associated with another mental disorder   Acute respiratory failure with hypoxia (HCC)   Status post tracheostomy [facility])   Assessment: 20 year old female born in [place] 16 years ago with Hashimoto's thyroiditis, admitted for encephalopathy and catatonia with floridly positive anti-NMDA antibodies in CSF(1:2560) consistent with anti-NMDA encephalitis. She ddi not respond to corticosteroids and developed seizures on IVIG. She underwent plasmapheresis and now started on Rituxan(1 dose on 03/04/21). 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 Tenafavir was started as pt had isolated HBV reactive as rituxan was started. ID re-engaged due to persistent fevers.   #Likely NMDA encephalitis SP corticosteroids, IVIG developing seizures->plasmapheresis x 5days->rituxanx1 dose -As autonomic dysfunction (including fever) is consistent with anti-nmda encephalitis. I agree with CSF titers as it may help correlate treatment response -I spoke to her mother today and she reports pt had not had episode of illness like this. She is up to date on her vaccinations. At the time of entry to the [place] from [place], pt's tb screen was negative.  Recommendations:  -If LP is done would add MTB NAAT in addition to NMDA titer, HSV, EBV(repeat) -Will continue to follow peripherally  #MSSA VAP  -Treated with 10 days of appropriate antibiotics, vancomycin ->cefazolin -Repeat CXR has been clear on 11/12   #IGRA was indeterminate  on 10/22 in the setting of IVIG -Repeat IGRA on 02/20/21 negative.  #1/4 bottle + Staph epi-consistent with contamination -hold antibiotics   Microbiology:    Antibiotics: Vancomycin  11/2-4 Cefazolin 11/3-11/10  Cultures: Blood Cx: 11/12 1/4 bottles Staph epi Resp Cx: trach aspirate 11/02 MSSA Subjective: Pt is resting in bed.Spoke to her mother who was in the room. Question pertaining to infectious etiology we answered.    Review of Systems: Review of Systems  Unable to perform ROS: Intubated   Past Medical History:  Diagnosis Date   Acquired autoimmune hypothyroidism    Dx 12/2014, TSH 110, FT4 0.3    Social History   Tobacco Use   Smoking status: Never   Smokeless tobacco: Never  Substance Use Topics   Alcohol use: Not Currently   Drug use: Not Currently    Family History  Problem Relation Age of Onset   Healthy Mother    Healthy Father    Scheduled Meds:  chlorhexidine gluconate (MEDLINE KIT)  15 mL Mouth Rinse BID   Chlorhexidine Gluconate Cloth  6 each Topical Daily   clonazePAM  0.5 mg Per Tube TID   clonazepam  1 mg Per Tube QHS   colchicine  0.6 mg Per Tube Daily   docusate  100 mg Per Tube BID   enoxaparin (LOVENOX) injection  40 mg Subcutaneous Q24H   feeding supplement (PROSource TF)  45 mL Per Tube TID   gabapentin  300 mg Per Tube Q8H   levothyroxine  100 mcg Per Tube Q0600   mouth rinse  15 mL Mouth Rinse 10 times per day   metoprolol tartrate  25 mg Per Tube TID   pantoprazole sodium  40 mg Per Tube QHS   polyethylene glycol  17 g Per Tube BID   scopolamine  1 patch Transdermal Q72H  senna-docusate  2 tablet Per NG tube BID   sodium chloride flush  10-40 mL Intracatheter Q12H   tenofovir  300 mg Per Tube Daily   thiamine  100 mg Per Tube Daily   traMADol  50 mg Per Tube Q6H   valproic acid  500 mg Per Tube Q8H   Continuous Infusions:  sodium chloride 10 mL/hr at 03/08/21 0800   famotidine (PEPCID) IV     feeding supplement (VITAL 1.5 CAL) 1,000 mL (03/08/21 0630)   levETIRAcetam 1,500 mg (03/08/21 1111)   sodium chloride     PRN Meds:.albuterol, atropine, diphenhydrAMINE, famotidine (PEPCID) IV, fentaNYL (SUBLIMAZE)  injection, LORazepam, morphine injection, sodium chloride, sodium chloride flush No Known Allergies  OBJECTIVE: Blood pressure 92/65, pulse (!) 112, temperature (!) 96.9 F (36.1 C), temperature source Axillary, resp. rate 18, height _0  (1.575 m), weight 56.1 kg, last menstrual period 01/11/2021, SpO2 99 %.  Physical Exam Constitutional:      Comments: Trach,  does not repond to commands  HENT:     Head: Normocephalic and atraumatic.     Right Ear: External ear normal.     Left Ear: External ear normal.     Nose: Nose normal.     Mouth/Throat:     Mouth: Mucous membranes are moist.  Eyes:     Extraocular Movements: Extraocular movements intact.     Conjunctiva/sclera: Conjunctivae normal.     Pupils: Pupils are equal, round, and reactive to light.  Cardiovascular:     Rate and Rhythm: Normal rate and regular rhythm.     Heart sounds: No murmur heard.   No friction rub. No gallop.  Pulmonary:     Effort: Pulmonary effort is normal.     Breath sounds: Normal breath sounds.  Abdominal:     General: Abdomen is flat.     Palpations: Abdomen is soft.  Skin:    General: Skin is warm and dry.     Comments: 1 inch marks from chest to middle of lower extremities  Neurological:     General: No focal deficit present.     Mental Status: She is oriented to person, place, and time.  Psychiatric:        Mood and Affect: Mood normal.    Lab Results Lab Results  Component Value Date   WBC 10.9 (H) 03/08/2021   WBC 10.7 (H) 03/08/2021   HGB 10.3 (L) 03/08/2021   HGB 10.3 (L) 03/08/2021   HCT 34.0 (L) 03/08/2021   HCT 34.0 (L) 03/08/2021   MCV 95.5 03/08/2021   MCV 97.1 03/08/2021   PLT 511 (H) 03/08/2021   PLT 535 (H) 03/08/2021    Lab Results  Component Value Date   CREATININE 0.37 (L) 03/08/2021   BUN 24 (H) 03/08/2021   NA 143 03/08/2021   K 3.9 03/08/2021   CL 104 03/08/2021   CO2 31 03/08/2021    Lab Results  Component Value Date   ALT 11 03/06/2021   AST 21  03/06/2021   ALKPHOS 32 (L) 03/06/2021   BILITOT 0.3 03/06/2021       Laurice Record, [place] [facility] 03/08/2021, 2:15 PM

## 2021-03-09 NOTE — Progress Notes (Signed)
[facility] Progress Note Patient Name: Jamie [name] DOB: 2000/12/05 MRN: [id number]   Date of Service  03/09/2021  HPI/Events of Note  BP 90/50, MAP 60, urine output is lower.  eICU Interventions  LR 500 ml iv fluid bolus ordered x 1.        Shylynn Bruning U [place] 03/09/2021, 1:01 AM

## 2021-03-09 NOTE — Progress Notes (Signed)
   03/09/21 1500  Clinical Encounter Type  Visited With Patient and family together  Visit Type Follow-up;Psychological support;Spiritual support  Referral From Other (Comment) (Chaplain Rounding)  Consult/Referral To Chaplain   Chaplain Jorene Guest responded to the attending nurse, Delsa Sale, requesting a [address] sit with the patient's mother to provide emotional and spiritual support. Ms. Martinique, as expected, continues to express her concern for the patient's progress; however, she relies on her faith for complete recovery. Christean Leaf also asked open-ended questions to explore how and encourage Ms. Martinique to practice self-care since she spends hours at the patient's bedside. Chaplain provided warm blankets for Ms. Martinique. Chaplain Baker Janus and [place] ended the visit with a prayer and scripture. This note was prepared by Jeanine Luz, M.Div..  For questions please contact by phone [phone].

## 2021-03-10 DIAGNOSIS — J9601 Acute respiratory failure with hypoxia: Secondary | ICD-10-CM | POA: Diagnosis not present

## 2021-03-10 DIAGNOSIS — G0490 Encephalitis and encephalomyelitis, unspecified: Secondary | ICD-10-CM | POA: Diagnosis not present

## 2021-03-10 LAB — GLUCOSE, CAPILLARY
Glucose-Capillary: 124 mg/dL — ABNORMAL HIGH (ref 70–99)
Glucose-Capillary: 96 mg/dL (ref 70–99)
Glucose-Capillary: 117 mg/dL — ABNORMAL HIGH (ref 70–99)
Glucose-Capillary: 125 mg/dL — ABNORMAL HIGH (ref 70–99)
Glucose-Capillary: 127 mg/dL — ABNORMAL HIGH (ref 70–99)

## 2021-03-10 LAB — PROTEIN AND GLUCOSE, CSF
Glucose, CSF: 76 mg/dL — ABNORMAL HIGH (ref 40–70)
Total  Protein, CSF: 21 mg/dL (ref 15–45)

## 2021-03-10 LAB — CSF CELL COUNT WITH DIFFERENTIAL
Eosinophils, CSF: 0 % (ref 0–1)
Lymphs, CSF: 96 % — ABNORMAL HIGH (ref 40–80)
Monocyte-Macrophage-Spinal Fluid: 4 % — ABNORMAL LOW (ref 15–45)
RBC Count, CSF: 70 /mm3 — ABNORMAL HIGH
Segmented Neutrophils-CSF: 0 % (ref 0–6)
Tube #: 1
WBC, CSF: 41 /mm3 (ref 0–5)

## 2021-03-10 LAB — CBC
HCT: 29.8 % — ABNORMAL LOW (ref 36.0–46.0)
Hemoglobin: 8.9 g/dL — ABNORMAL LOW (ref 12.0–15.0)
MCH: 28.7 pg (ref 26.0–34.0)
MCHC: 29.9 g/dL — ABNORMAL LOW (ref 30.0–36.0)
MCV: 96.1 fL (ref 80.0–100.0)
Platelets: 436 10*3/uL — ABNORMAL HIGH (ref 150–400)
RBC: 3.1 MIL/uL — ABNORMAL LOW (ref 3.87–5.11)
RDW: 17.8 % — ABNORMAL HIGH (ref 11.5–15.5)
WBC: 8.7 10*3/uL (ref 4.0–10.5)
nRBC: 0 % (ref 0.0–0.2)

## 2021-03-10 LAB — EPSTEIN BARR VRS(EBV DNA BY PCR): EBV DNA QN by PCR: NEGATIVE IU/mL

## 2021-03-10 LAB — MAGNESIUM: Magnesium: 2 mg/dL (ref 1.7–2.4)

## 2021-03-10 LAB — BASIC METABOLIC PANEL
Anion gap: 7 (ref 5–15)
BUN: 17 mg/dL (ref 6–20)
CO2: 28 mmol/L (ref 22–32)
Calcium: 8.8 mg/dL — ABNORMAL LOW (ref 8.9–10.3)
Chloride: 103 mmol/L (ref 98–111)
Creatinine, Ser: 0.43 mg/dL — ABNORMAL LOW (ref 0.44–1.00)
GFR, Estimated: >60 mL/min (ref >60)
Glucose, Bld: 113 mg/dL — ABNORMAL HIGH (ref 70–99)
Potassium: 3.6 mmol/L (ref 3.5–5.1)
Sodium: 138 mmol/L (ref 135–145)

## 2021-03-10 MED ORDER — POTASSIUM CHLORIDE 20 MEQ PO PACK
40.0000 meq | PACK | Freq: Once | ORAL | Status: AC
  Administered 2021-03-10: 40 meq
  Filled 2021-03-10: qty 2

## 2021-03-10 MED ORDER — METOPROLOL TARTRATE 25 MG PO TABS
25.0000 mg | ORAL_TABLET | Freq: Two times a day (BID) | ORAL | Status: DC
  Administered 2021-03-10 – 2021-03-14 (×9): 25 mg
  Filled 2021-03-10 (×9): qty 1

## 2021-03-10 MED ORDER — FENTANYL BOLUS VIA INFUSION
50.0000 ug | INTRAVENOUS | Status: DC | PRN
  Administered 2021-03-10 – 2021-03-11 (×7): 50 ug via INTRAVENOUS
  Administered 2021-03-11: 03:00:00 100 ug via INTRAVENOUS
  Administered 2021-03-11: 01:00:00 50 ug via INTRAVENOUS
  Administered 2021-03-11 (×3): 100 ug via INTRAVENOUS
  Administered 2021-03-11 – 2021-03-12 (×2): 50 ug via INTRAVENOUS
  Administered 2021-03-12: 100 ug via INTRAVENOUS
  Administered 2021-03-12 – 2021-03-14 (×8): 50 ug via INTRAVENOUS
  Administered 2021-03-14: 100 ug via INTRAVENOUS
  Administered 2021-03-14 – 2021-03-15 (×3): 50 ug via INTRAVENOUS
  Administered 2021-03-15: 100 ug via INTRAVENOUS
  Filled 2021-03-10: qty 100

## 2021-03-10 MED ORDER — FENTANYL 2500MCG IN NS 250ML (10MCG/ML) PREMIX INFUSION
50.0000 ug/h | INTRAVENOUS | Status: DC
  Administered 2021-03-10: 100 ug/h via INTRAVENOUS
  Administered 2021-03-10: 50 ug/h via INTRAVENOUS
  Administered 2021-03-11: 100 ug/h via INTRAVENOUS
  Administered 2021-03-13 (×2): 125 ug/h via INTRAVENOUS
  Administered 2021-03-14 – 2021-03-15 (×2): 150 ug/h via INTRAVENOUS
  Administered 2021-03-15 (×2): 200 ug/h via INTRAVENOUS
  Administered 2021-03-16: 150 ug/h via INTRAVENOUS
  Filled 2021-03-10 (×9): qty 250

## 2021-03-10 MED ORDER — MIDAZOLAM HCL 2 MG/2ML IJ SOLN
INTRAMUSCULAR | Status: AC
  Administered 2021-03-10: 2 mg
  Filled 2021-03-10: qty 2

## 2021-03-10 MED ORDER — ACETAMINOPHEN 160 MG/5ML PO SOLN
650.0000 mg | ORAL | Status: AC
  Administered 2021-03-10: 650 mg
  Filled 2021-03-10: qty 20.3

## 2021-03-10 MED ORDER — FENTANYL CITRATE PF 50 MCG/ML IJ SOSY
50.0000 ug | PREFILLED_SYRINGE | Freq: Once | INTRAMUSCULAR | Status: AC
  Administered 2021-03-10: 50 ug via INTRAVENOUS
  Filled 2021-03-10: qty 1

## 2021-03-10 MED ORDER — MIDAZOLAM HCL 2 MG/2ML IJ SOLN: 2.0000 mg | Freq: Once | INTRAMUSCULAR | Status: AC

## 2021-03-10 MED ORDER — VECURONIUM BROMIDE 10 MG IV SOLR: 10.0000 mg | Freq: Once | INTRAVENOUS | Status: AC

## 2021-03-10 MED ORDER — VECURONIUM BROMIDE 10 MG IV SOLR
INTRAVENOUS | Status: AC
  Administered 2021-03-10: 10 mg
  Filled 2021-03-10: qty 10

## 2021-03-10 NOTE — Progress Notes (Signed)
NAME:  Jamie [name], MRN:  [id number], DOB:  2000/10/14, LOS: 21 ADMISSION DATE:  02/06/2021, CONSULTATION DATE:  10/28 REFERRING MD:  Raymondo Band, CHIEF COMPLAINT:  Seizure, acute metabolic encephalopathy   History of Present Illness:  20 y/o female admitted with seizure and change in mental status.  She was treated with steroids and IVIg but despite this her condition worsened.  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  After treatment her mental status worsened and she developed inability to protect her airway in the setting of tachypnea and tachycardia.  She required intubation on 10/28, trach 11/8.   Pertinent  Medical History  Acquired autoimmune hypothyroidism  Significant Hospital Events: Including procedures, antibiotic start and stop dates in addition to other pertinent events   10/16 Admit 10/25 New onset seizure activity, PCCM consult 1026 pulmonary critical care signed off 02/19/2021 pulmonary critical care reconsulted for suspected respiratory distress.  intubated, continuous EEG started 10/30 starting plasmapheresis 10/31 MRI normal, EEG with GPD+ 11/1 RN reports pt seized while in MRI , additional agents added overnight.   11/5 completed 4th session of PLEX 11/7 due for 5/5 PLEX. Afternoon R blown pupil. No etiology on imaging.  11/8 trach 11/12 Rhythmic movements concerned for seizures, fever 102. Neuro evaluated for dyskinesia 11/13 Overnight continued to have dyskinetic movements associated with tachycardia. EEG with no seizure.   Micro/labs: 10/16 influenza AB PCR negative, SARS-CoV-2 PCR negative 10/16 HIV antibody negative 10/16 urine culture multiple species suggest recollection 10/16 urine drug screen negative, alcohol less than 10 10/17 CSF lymphocytic pleocytosis, fungal culture negative, CSF culture negative 10/17 CSF HSV 1 DNA negative, HSV-2 DNA negative, VZV PCR CSF negative 10/17 CSF cryptococcal antigen negative, cryptococcal  antigen titer not indicated 10/17 CSF EBV positive  10/17 CMV qualitative CSF negative 10/17 NMDA IgG on CSF > 1:2560 10/18 HIV RNA quant less than 20 10/20 NMDA IgG serum: positive 1:1000 10/22 RPR nonreactive 10/22 QuantiFERON gold intermediate 10/23 EBV DNA QuantiFERON by PCR positive, less than 35 10/25 hepatitis B surface antigen nonreactive, hepatitis B core total antibody positive, hepatitis B antibody positive, hepatitis B DNA not detected 10/25 hepatitis C antibody negative 10/26 blood culture no growth to date 11/2 Tracheal aspirate abundant MSSA   Imaging: 10/16 CT head negative 10/16 MRI brain incomplete study due to patient intolerance without evidence of acute intracranial abnormality 10/22 chest abdomen pelvis CT scan no acute intrathoracic abdominal or pelvic pathology, no evidence of malignancy 10/22 MRI brain normal MRI of the brain 10/26 pelvic ultrasound right ovary not visualized, normal appearance of left ovary and uterus, trace free fluid in pelvis 10/27 pelvic MRI: Normal pelvic MRI, symmetric normal ovaries, no adnexal masses 10/31 MRI > no acute infarct or hemorrhage. Normal MRI.  11/7 CT H no acute abnormality 11/7 MRI brain no acute process  11/13 EEG suggestive of moderate to severe diffuse encephalopathy, likely related to NMDA encephalitis. No seizures or epileptiform discharges  11/14 Doppler ultrasound lower extremities negative for DVT  Procedures: Trach 11/8 >>  R IJ HD catheter 10/29 >>  RUE PICC 11/1 >>  Interim History / Subjective:  Episode of tachycardia overnight Rhythmic movements lower extremities, also face and lips Remains on fentanyl as needed, propofol stopped.  Clonazepam, Depakote, Keppra, gabapentin all ordered.  Ativan and morphine expired and off med list   Objective   Blood pressure 116/68, pulse (!) 123, temperature 100.3 F (37.9 C), temperature source Axillary, resp. rate (!) 26, height 5\' 2"  (1.575 m),  weight 54.4 kg,  last menstrual period 01/11/2021, SpO2 96 %.    Vent Mode: PRVC FiO2 (%):  [30 %] 30 % Set Rate:  [18 bmp] 18 bmp Vt Set:  [400 mL] 400 mL PEEP:  [5 cmH20] 5 cmH20 Plateau Pressure:  [14 cmH20-17 cmH20] 16 cmH20   Intake/Output Summary (Last 24 hours) at 03/10/2021 0809 Last data filed at 03/10/2021 0400 Gross per 24 hour  Intake 1617.2 ml  Output 1015 ml  Net 602.2 ml   Filed Weights   03/06/21 0146 03/07/21 0359 03/10/21 0400  Weight: 56.2 kg 56.1 kg 54.4 kg   Physical Exam: General: Ill-appearing young woman, mechanically ventilated, thin HEENT: Trach tube in place, oropharynx otherwise clear, right pupil slightly larger than left, both react Neuro: Spontaneous rhythmic movements of bilateral lower extremities, some twitching of her face.  She does not wake, track, follow commands, respond to stimuli CV: Tachycardic 125 regular, distant PULM: No crackles, no wheezes, comfortable respiratory pattern GI: Nondistended, positive bowel sounds Extremities: Thin, no edema Skin: No rash  Assessment & Plan:   Anti NMDA Receptor Encephalitis Anisocoria, R pupil larger than L, intermittently react. R Eye Mydriasis Status Epilepticus, resolved Dyskinesias, persistent Acute encephalopathy due to Encephalitis and Status Epilepticus S/p 5 days of IV Solu-Medrol, 5 days of IVIG, 5 days of Plex and 1 dose of Rituxan 11/10.  MRI pelvis neg for ovarian teratoma. R eye mydriasis 11/7 with negative imaging.  Intermittent dyskinsias with negative EEG.   -Appreciate neurology management and assistance -Propofol discontinued 11/14, tolerating but still with dystonic movements.  Seem to respond to fentanyl when needed.  Remains on Keppra, Depakote, gabapentin, scheduled clonazepam -Will discuss with neurology need for restart Ativan as needed -Appreciate ID input, planning for repeat LP on 11/16: Reassess EBV, CMV, HSV, MTB NAAT -First dose of rituximab 11/10, typically dosed weekly, next  11/17  Intermittent fever, tachycardia Suspect related to her ongoing encephalitis.  Appreciate ID reassessment on 11/14.  No clear evidence for active infection.  Blood culture 1 of 4 staph epidermidis, likely contaminant.  HD catheter removed 11/14.  Doppler ultrasound lower extremities -11/14. -Currently following off antibiotics.  Appreciate ID assistance -Planning for repeat LP 11/16 to assess NMDA antibodies, other possible infectious causes as above.  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 -Okay to try PSV if she can tolerate, has not done so thus far.  Not a candidate for decannulation due to her mental status -Scopolamine patch for her increased oral secretions   Acute Pericarditis with small Pericardial Effusion.  Small pericardial effusion seen on ECHO 11/3, resolved 11/11 ECHO -Appreciate Dr. Merrilee Jansky assistance.  We will continue colchicine for 3 months, follow-up echocardiogram at that time -Following CBC-continue levothyroxine Anemia of critical illness S/p 1 unit PRBC -Follow CBC -transfuse for Hgb <7%   Hashimoto thyroiditis.  -On levothyroxine  Hypokalemia -Monitoring BMP -Replace if indicated  Best Practice (right click and "Reselect all SmartList Selections" daily)  Diet/type: tubefeeds DVT prophylaxis: LMWH GI prophylaxis: PPI Lines: Central line (PICC placed 11/1) Foley:  N/A Code Status:  full code Last date of multidisciplinary goals of care discussion: no change in goals of care, full code.  Updating mother daily  Critical Care Time: 42 minutes     Baltazar Apo, MD, PhD 03/10/2021, 8:09 AM [facility] [phone] or if no answer before 7:00PM  call [phone] For any issues after 7:00PM please call eLink [phone]

## 2021-03-10 NOTE — Progress Notes (Signed)
[facility] ADULT ICU REPLACEMENT PROTOCOL   The patient does apply for the [facility] Adult ICU Electrolyte Replacment Protocol based on the criteria listed below:   1.Exclusion criteria: TCTS patients, ECMO patients, and Dialysis patients 2. Is GFR >/= 30 ml/min? Yes.    Patient's GFR today is >60 3. Is SCr </= 2? Yes.   Patient's SCr is 0.43 mg/dL 4. Did SCr increase >/= 0.5 in 24 hours? No. 5.Pt's weight >40kg  Yes.   6. Abnormal electrolyte(s): K+ 3.6  7. Electrolytes replaced per protocol 8.  Call MD STAT for K+ </= 2.5, Phos </= 1, or Mag </= 1 Physician:  n/a  Darlys Gales 03/10/2021 6:29 AM

## 2021-03-10 NOTE — Progress Notes (Signed)
[facility] Progress Note Patient Name: Jamie [name] DOB: Mar 19, 2001 MRN: [id number]   Date of Service  03/10/2021  HPI/Events of Note  Patient with heart rate 130.  eICU Interventions  Bedside RN given nursing communication order to give NG tube Metoprolol dose scheduled for 10 AM now.        [name] Kyi Romanello 03/10/2021, 6:55 AM

## 2021-03-10 NOTE — Procedures (Signed)
Lumbar Puncture Procedure Note  Jamie [name]  [id number]  05/20/2000  Date:03/10/21  Time:1:18 PM   Provider Performing:Broc Caspers S Kollins Fenter   Procedure: Lumbar Puncture (54492)  Indication(s) Rule out meningitis  Consent Risks of the procedure as well as the alternatives and risks of each were explained to the patient and/or caregiver.  Consent for the procedure was obtained and is signed in the bedside chart  Anesthesia Topical only with 1% lidocaine  2mg  versed, vecuronium 10mg   Time Out Verified patient identification, verified procedure, site/side was marked, verified correct patient position, special equipment/implants available, medications/allergies/relevant history reviewed, required imaging and test results available.   Sterile Technique Maximal sterile technique including sterile barrier drape, hand hygiene, sterile gown, sterile gloves, mask, hair covering.    Procedure Description Using palpation, approximate location of L3-L4 space identified.   Lidocaine used to anesthetize skin and subcutaneous tissue overlying this area.  A 20g spinal needle was then used to access the subarachnoid space. Opening pressure:Not obtained. Closing pressure:Not obtained. 11cc CSF obtained, divided tubes.  Complications/Tolerance None; patient tolerated the procedure well.   EBL None   Specimen(s) CSF   Baltazar Apo, MD, PhD 03/10/2021, 1:20 PM [facility] [phone] or if no answer before 7:00PM call [phone] For any issues after 7:00PM please call eLink [phone]

## 2021-03-10 NOTE — Progress Notes (Signed)
Speech Language Pathology Discharge Patient Details Name: Jamie [name] MRN: [id number] DOB: 07-17-2000 Today's Date: 03/10/2021 Time:  -   Patient discharged from SLP services secondary to:  therapist has followed pt's progress which has been little to none unfortunately. ST has been unable to evaluate pt. Will discharge from services. If she improves  and is able to participate please reorder.   Please see latest therapy progress note for current level of functioning and progress toward goals.    Progress and discharge plan discussed with patient and/or caregiver: Patient unable to participate in discharge planning and no caregivers available  GO     [name] 03/10/2021, 9:26 AM   [name].Ed Risk analyst [phone] Office [phone]

## 2021-03-10 NOTE — Progress Notes (Addendum)
Subjective: Had to resume fentanyl gtt. because of dyskinesias and tachycardia with heart rate in the 170s.  Also had lumbar puncture today.   ROS: Unable to obtain due to poor mental status  Examination  Vital signs in last 24 hours: Temp:  [98.6 F (37 C)-103.7 F (39.8 C)] 102.9 F (39.4 C) (11/16 1200) Pulse Rate:  [87-150] 128 (11/16 1518) Resp:  [18-29] 18 (11/16 1518) BP: (90-133)/(40-118) 94/68 (11/16 1518) SpO2:  [90 %-100 %] 96 % (11/16 1518) FiO2 (%):  [30 %] 30 % (11/16 1518) Weight:  [54.4 kg] 54.4 kg (11/16 0400)  General: lying in bed, NAD CVS: pulse-normal rate and rhythm RS: +trach, coarse breath sounds bilaterally Extremities: normal, +edema  Neuro:  Does not open eyes to noxious stimuli, right pupil dilated and reacting to light, left pupil sluggishly reacting to light, no apparent facial asymmetry, corneal reflex intact, did not withdraw to noxious stimuli in all extremities.    Basic Metabolic Panel: Recent Labs  Lab 03/05/21 0403 03/06/21 0155 03/06/21 0756 03/07/21 0612 03/07/21 2014 03/08/21 0506 03/09/21 0352 03/10/21 0337  NA 139  --    < > 143 143 143 140 138  K 3.2*  --    < > 3.2* 4.4 3.9 3.1* 3.6  CL 102  --    < > 106 104 104 101 103  CO2 28  --    < > 30 30 31 28 28   GLUCOSE 138*  --    < > 126* 112* 102* 92 113*  BUN 15  --    < > 17 25* 24* 25* 17  CREATININE <0.30*  --    < > 0.36* 0.44 0.37* 0.38* 0.43*  CALCIUM 9.0  --    < > 9.1 9.6 9.3 9.1 8.8*  MG 2.2 2.2  --  2.3  --  2.5* 2.2 2.0  PHOS 2.5  --   --   --   --   --   --   --    < > = values in this interval not displayed.    CBC: Recent Labs  Lab 03/05/21 0403 03/06/21 0756 03/08/21 0506 03/09/21 0352 03/10/21 0337  WBC 9.1 9.2 10.7*  10.9* 10.5 8.7  NEUTROABS 5.6  --  7.7  --   --   HGB 10.0* 9.4* 10.3*  10.3* 9.1* 8.9*  HCT 32.0* 30.8* 34.0*  34.0* 30.0* 29.8*  MCV 94.4 94.5 97.1  95.5 94.9 96.1  PLT 437* 496* 535*  511* 423* 436*     Coagulation  Studies: No results for input(s): LABPROT, INR in the last 72 hours.  Imaging No new brain imaging overnight     ASSESSMENT AND PLAN: 20 y.o. 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 intake for 4 days and decreased verbalization 10/15 prior to arrival. 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  She has since completed 5 days of IV Solu-Medrol, 5 days of IVIG, 5 days of Plex and 1 dose of Rituxan on 03/04/2021.     Summary of work-up so far    Thyroperoxidase antibody 68 Thyroglobulin antibody 57.6 Lumbar puncture on 02/08/2021: 318 WBCs (94 lymphocytes, 0 neutrophils), total protein 50, glucose 68, fungal culture negative, negative HSV 1 and 2 DNA, negative VZV, EBV qualitative positive, CMV CSF negative, cryptococcal antigen negative, NMDA CSF  1:1280,  3 oligoclonal bands, IgG CSF 10.5, IgG/albumin  ratio in CSF 0.40, CSF IgG index 1.4, CSF path showed lymphocytosis NMDA IgG  >1:2560, NMDA antibody titer 1:1000 Serum RPR negative, quantifying TB Gold negative EBV DNA by PCR <35 ANA positive dsDNA antibody negative Ribonucleoprotein negative  ENA SN negative Scleroderma antibody negative SSA 1.7 SSB negative  chromatin antibody negative  anti-Jo 1 antibody negative  centromere antibody screen negative JC virus DNA PCR, blood negative Protein electrophoresis, serum showed Albumin-ELP 2.7 (2.9-4.4) and gammaglobulin 2.6 ( 0.4-1.8) IgG immunoglobulin in serum 2748 ( [phone]) Hepatitis B surface antigen nonreactive Hepatitis B core antibody reactive Hepatitis B post: 856 Hepatitis C Antibody negative EBV VCA IgM normal, IgG >600 ( 0-17.9) and EBV NA IgG >600 (0-17.9)     NMDA encephalitis Acute encephalopathy, due to NMDA encephalitis and seizures New onset seizures Acute respiratory failure Dyskinesias Right eye  mydriasis Recurrent fever Sympathetic storming -No clinical seizures.  Dyskinesias worsened and therefore had to start fentanyl gtt.  Recommendation -had repeat lumbar puncture, will again check for CSF NMDA as well as serum NMDA antibodies -Continue Klonopin 0.5 mg at 0700, 1200, 1700 and continue Klonopin 1 mg nightly, Depakote 500 mg every 8 hours and Keppra 1500 mg twice daily -Also on gabapentin 300 mg 3 times daily. -Coordinating with pharmacy regarding second dose of rituximab end of next week - management of rest per critical care team  I have spent a total of  25 minutes with the patient reviewing hospital notes,  test results, labs and examining the patient as well as establishing an assessment and plan  > 50% of time was spent in direct patient care.    Zeb Comfort Epilepsy [facility] For questions after 5pm please refer to AMION to reach the Neurologist on call

## 2021-03-11 DIAGNOSIS — G0490 Encephalitis and encephalomyelitis, unspecified: Secondary | ICD-10-CM | POA: Diagnosis not present

## 2021-03-11 DIAGNOSIS — J9601 Acute respiratory failure with hypoxia: Secondary | ICD-10-CM | POA: Diagnosis not present

## 2021-03-11 DIAGNOSIS — G249 Dystonia, unspecified: Secondary | ICD-10-CM | POA: Diagnosis not present

## 2021-03-11 LAB — GLUCOSE, CAPILLARY
Glucose-Capillary: 117 mg/dL — ABNORMAL HIGH (ref 70–99)
Glucose-Capillary: 125 mg/dL — ABNORMAL HIGH (ref 70–99)
Glucose-Capillary: 125 mg/dL — ABNORMAL HIGH (ref 70–99)
Glucose-Capillary: 129 mg/dL — ABNORMAL HIGH (ref 70–99)
Glucose-Capillary: 133 mg/dL — ABNORMAL HIGH (ref 70–99)
Glucose-Capillary: 138 mg/dL — ABNORMAL HIGH (ref 70–99)
Glucose-Capillary: 149 mg/dL — ABNORMAL HIGH (ref 70–99)

## 2021-03-11 LAB — BASIC METABOLIC PANEL
Anion gap: 5 (ref 5–15)
Anion gap: 7 (ref 5–15)
BUN: 9 mg/dL (ref 6–20)
BUN: 10 mg/dL (ref 6–20)
CO2: 27 mmol/L (ref 22–32)
CO2: 31 mmol/L (ref 22–32)
Calcium: 7.4 mg/dL — ABNORMAL LOW (ref 8.9–10.3)
Calcium: 8.6 mg/dL — ABNORMAL LOW (ref 8.9–10.3)
Chloride: 107 mmol/L (ref 98–111)
Chloride: 101 mmol/L (ref 98–111)
Creatinine, Ser: <0.3 mg/dL — ABNORMAL LOW (ref 0.44–1.00)
Creatinine, Ser: 0.42 mg/dL — ABNORMAL LOW (ref 0.44–1.00)
GFR, Estimated: >60 mL/min (ref >60)
Glucose, Bld: 120 mg/dL — ABNORMAL HIGH (ref 70–99)
Glucose, Bld: 125 mg/dL — ABNORMAL HIGH (ref 70–99)
Potassium: 3.1 mmol/L — ABNORMAL LOW (ref 3.5–5.1)
Potassium: 3.1 mmol/L — ABNORMAL LOW (ref 3.5–5.1)
Sodium: 139 mmol/L (ref 135–145)
Sodium: 139 mmol/L (ref 135–145)

## 2021-03-11 LAB — MAGNESIUM
Magnesium: 1.7 mg/dL (ref 1.7–2.4)
Magnesium: 1.9 mg/dL (ref 1.7–2.4)

## 2021-03-11 LAB — CBC
HCT: 18.4 % — ABNORMAL LOW (ref 36.0–46.0)
Hemoglobin: 5.3 g/dL — CL (ref 12.0–15.0)
MCH: 28.6 pg (ref 26.0–34.0)
MCHC: 28.8 g/dL — ABNORMAL LOW (ref 30.0–36.0)
MCV: 99.5 fL (ref 80.0–100.0)
Platelets: 236 10*3/uL (ref 150–400)
RBC: 1.85 MIL/uL — ABNORMAL LOW (ref 3.87–5.11)
RDW: 17.2 % — ABNORMAL HIGH (ref 11.5–15.5)
WBC: 7.2 10*3/uL (ref 4.0–10.5)
nRBC: 0 % (ref 0.0–0.2)

## 2021-03-11 LAB — CULTURE, BLOOD (ROUTINE X 2)
Culture: NO GROWTH
Special Requests: ADEQUATE

## 2021-03-11 LAB — HEMOGLOBIN AND HEMATOCRIT, BLOOD
HCT: 27.5 % — ABNORMAL LOW (ref 36.0–46.0)
Hemoglobin: 8.3 g/dL — ABNORMAL LOW (ref 12.0–15.0)

## 2021-03-11 MED ORDER — MAGNESIUM SULFATE 2 GM/50ML IV SOLN
2.0000 g | Freq: Once | INTRAVENOUS | Status: AC
  Administered 2021-03-11: 09:00:00 2 g via INTRAVENOUS
  Filled 2021-03-11: qty 50

## 2021-03-11 MED ORDER — ACETAMINOPHEN 160 MG/5ML PO SOLN
650.0000 mg | ORAL | Status: DC | PRN
  Administered 2021-03-11 – 2021-03-15 (×5): 650 mg
  Filled 2021-03-11 (×5): qty 20.3

## 2021-03-11 MED ORDER — POTASSIUM CHLORIDE 20 MEQ PO PACK
20.0000 meq | PACK | ORAL | Status: AC
  Administered 2021-03-11 (×2): 20 meq
  Filled 2021-03-11 (×2): qty 1

## 2021-03-11 MED ORDER — ENOXAPARIN SODIUM 40 MG/0.4ML IJ SOSY
40.0000 mg | PREFILLED_SYRINGE | INTRAMUSCULAR | Status: DC
  Administered 2021-03-11 – 2021-03-16 (×6): 40 mg via SUBCUTANEOUS
  Filled 2021-03-11 (×6): qty 0.4

## 2021-03-11 MED ORDER — LORAZEPAM 2 MG/ML IJ SOLN
2.0000 mg | INTRAMUSCULAR | Status: DC | PRN
Start: 2021-03-11 — End: 2021-03-17
  Administered 2021-03-11 – 2021-03-15 (×17): 2 mg via INTRAVENOUS
  Filled 2021-03-11 (×19): qty 1

## 2021-03-11 MED ORDER — LORAZEPAM 2 MG/ML IJ SOLN
1.0000 mg | Freq: Four times a day (QID) | INTRAMUSCULAR | Status: DC | PRN
  Administered 2021-03-11 (×2): 1 mg via INTRAVENOUS
  Filled 2021-03-11 (×3): qty 1

## 2021-03-11 MED ORDER — LORAZEPAM 2 MG/ML IJ SOLN
1.0000 mg | Freq: Once | INTRAMUSCULAR | Status: AC
  Administered 2021-03-11: 04:00:00 1 mg via INTRAVENOUS

## 2021-03-11 MED ORDER — SODIUM CHLORIDE 0.9% FLUSH: 10.0000 mL | INTRAVENOUS | Status: DC | PRN

## 2021-03-11 MED ORDER — MODAFINIL 100 MG PO TABS
100.0000 mg | ORAL_TABLET | Freq: Every day | ORAL | Status: DC
  Administered 2021-03-11: 15:00:00 100 mg
  Filled 2021-03-11: qty 1

## 2021-03-11 MED ORDER — LORAZEPAM 2 MG/ML IJ SOLN
INTRAMUSCULAR | Status: AC
  Filled 2021-03-11: qty 1

## 2021-03-11 MED ORDER — POTASSIUM CHLORIDE 10 MEQ/50ML IV SOLN
10.0000 meq | INTRAVENOUS | Status: AC
  Administered 2021-03-11 (×4): 10 meq via INTRAVENOUS
  Filled 2021-03-11 (×4): qty 50

## 2021-03-11 NOTE — Progress Notes (Signed)
Subjective: Has continued to have frequent episodes of dyskinesia with tachycardia overnight.  ROS: Unable to obtain due to poor mental status  Examination  Vital signs in last 24 hours: Temp:  [98.9 F (37.2 C)-102.9 F (39.4 C)] 99.9 F (37.7 C) (11/17 0400) Pulse Rate:  [102-162] 108 (11/17 1000) Resp:  [16-32] 18 (11/17 1000) BP: (93-158)/(40-129) 101/64 (11/17 1000) SpO2:  [78 %-100 %] 100 % (11/17 1000) FiO2 (%):  [30 %] 30 % (11/17 0800)  General: lying in bed, NAD CVS: pulse-normal rate and rhythm RS: +trach, coarse breath sounds bilaterally Extremities: normal, +edema  Neuro:  Does not open eyes to noxious stimuli, right pupil dilated and reacting to light, left pupil sluggishly reacting to light, no apparent facial asymmetry, corneal reflex intact, withdraws to noxious stimuli in bilateral lower extremities, has semirhythmic bilateral eye fluttering as well as bilateral upper extremity movement consistent with orofacial and bilateral upper extremity dyskinesias   Basic Metabolic Panel: Recent Labs  Lab 03/05/21 0403 03/06/21 0155 03/08/21 0506 03/09/21 0352 03/10/21 0337 03/11/21 0424 03/11/21 0601  NA 139   < > 143 140 138 139 139  K 3.2*   < > 3.9 3.1* 3.6 3.1* 3.1*  CL 102   < > 104 101 103 107 101  CO2 28   < > 31 28 28 27 31   GLUCOSE 138*   < > 102* 92 113* 120* 125*  BUN 15   < > 24* 25* 17 9 10   CREATININE <0.30*   < > 0.37* 0.38* 0.43* <0.30* 0.42*  CALCIUM 9.0   < > 9.3 9.1 8.8* 7.4* 8.6*  MG 2.2   < > 2.5* 2.2 2.0 1.7 1.9  PHOS 2.5  --   --   --   --   --   --    < > = values in this interval not displayed.    CBC: Recent Labs  Lab 03/05/21 0403 03/06/21 0756 03/08/21 0506 03/09/21 0352 03/10/21 0337 03/11/21 0424 03/11/21 0601  WBC 9.1 9.2 10.7*  10.9* 10.5 8.7 7.2  --   NEUTROABS 5.6  --  7.7  --   --   --   --   HGB 10.0* 9.4* 10.3*  10.3* 9.1* 8.9* 5.3* 8.3*  HCT 32.0* 30.8* 34.0*  34.0* 30.0* 29.8* 18.4* 27.5*  MCV 94.4 94.5  97.1  95.5 94.9 96.1 99.5  --   PLT 437* 496* 535*  511* 423* 436* 236  --      Coagulation Studies: No results for input(s): LABPROT, INR in the last 72 hours.  Imaging (s): LABPROT, INR in the last 72 hours.     Imaging No new brain imaging overnight     ASSESSMENT AND PLAN: 20 y.o. 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 intake for 4 days and decreased verbalization 10/15 prior to arrival. 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  She has since completed 5 days of IV Solu-Medrol, 5 days of IVIG, 5 days of Plex and 1 dose of Rituxan on 03/04/2021.     Summary of work-up so far    Thyroperoxidase antibody 68 Thyroglobulin antibody 57.6 Lumbar puncture on 02/08/2021: 318 WBCs (94 lymphocytes, 0 neutrophils), total protein 50, glucose 68, fungal culture negative, negative HSV 1 and 2 DNA, negative VZV, EBV qualitative positive, CMV CSF negative, cryptococcal antigen negative, NMDA CSF  1:1280,  3  oligoclonal bands, IgG CSF 10.5, IgG/albumin ratio in CSF 0.40, CSF IgG index 1.4, CSF path showed lymphocytosis NMDA IgG  >1:2560, NMDA antibody titer 1:1000 Serum RPR negative, quantifying TB Gold negative EBV DNA by PCR <35 ANA positive dsDNA antibody negative Ribonucleoprotein negative  ENA SN negative Scleroderma antibody negative SSA 1.7 SSB negative  chromatin antibody negative  anti-Jo 1 antibody negative  centromere antibody screen negative JC virus DNA PCR, blood negative Protein electrophoresis, serum showed Albumin-ELP 2.7 (2.9-4.4) and gammaglobulin 2.6 ( 0.4-1.8) IgG immunoglobulin in serum 2748 ( [phone]) Hepatitis B surface antigen nonreactive Hepatitis B core antibody reactive Hepatitis B post: 856 Hepatitis C Antibody negative EBV VCA IgM normal, IgG >600 ( 0-17.9) and EBV NA IgG >600 (0-17.9) Repeat LP  showed 41 wbc ( 96 lymphocytes)     NMDA encephalitis Acute encephalopathy, due to NMDA encephalitis and seizures New onset seizures Acute respiratory failure Dyskinesias Right eye mydriasis ( improving) Recurrent fever Sympathetic storming -No clinical seizures.  Still having intermittent dyskinesias as well as storming -Had lumbar puncture yesterday which showed 41 WBCs 96 lymphocytes.  Had 314 WBCs a month ago  Recommendation -Discussed with mother about starting modafinil 100 mg daily to promote wakefulness -Continue Klonopin 0.5 mg at 0700, 1200, 1700 and continue Klonopin 1 mg nightly, Depakote 500 mg every 8 hours and Keppra 1500 mg twice daily -Also on gabapentin 300 mg 3 times daily. -Plan for second dose of rituximab on 03/20/2021 if patient remains comatose (Of note patient did get 1 dose before Plex as well) - management of rest per critical care team -Discussed assessment and plan in great detail with mother at bedside as well as RN.   I have spent a total of  36 minutes with the patient reviewing hospital notes,  test results, labs and examining the patient as well as establishing an assessment and plan  > 50% of time was spent in direct patient care.     Zeb Comfort Epilepsy [facility] For questions after 5pm please refer to AMION to reach the Neurologist on call

## 2021-03-11 NOTE — Progress Notes (Signed)
eLink Physician-Brief Progress Note Patient Name: Jamie [name] DOB: November 11, 2000 MRN: [id number]   Date of Service  03/11/2021  HPI/Events of Note  Hemoglobin unexpectedly low at 5.3 gm / dl, there is no evidence of bleeding, sample was drawn from a PICC line raising the possibility of hemodilution from an insufficiently cleared line.  eICU Interventions  Stat H and H ordered to verify the result.        Shajuan Musso U [place] Bolds 03/11/2021, 6:01 AM

## 2021-03-11 NOTE — Progress Notes (Signed)
Verbal order from Dr.Lindzen to change Ativan frequency. New orders placed. Order to go ahead and give dose of Ativan now.

## 2021-03-11 NOTE — Progress Notes (Addendum)
NAME:  Jamie [name], MRN:  [id number], DOB:  2000/05/29, LOS: 60 ADMISSION DATE:  02/06/2021, CONSULTATION DATE:  10/28 REFERRING MD:  Raymondo Band, CHIEF COMPLAINT:  Seizure, acute metabolic encephalopathy   History of Present Illness:  20 y/o female admitted with seizure and change in mental status.  She was treated with steroids and IVIg but despite this her condition worsened.  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  After treatment her mental status worsened and she developed inability to protect her airway in the setting of tachypnea and tachycardia.  She required intubation on 10/28, trach 11/8.   Pertinent  Medical History  Acquired autoimmune hypothyroidism  Significant Hospital Events: Including procedures, antibiotic start and stop dates in addition to other pertinent events   10/16 Admit 10/25 New onset seizure activity, PCCM consult 1026 pulmonary critical care signed off 02/19/2021 pulmonary critical care reconsulted for suspected respiratory distress.  intubated, continuous EEG started 10/30 starting plasmapheresis 10/31 MRI normal, EEG with GPD+ 11/1 RN reports pt seized while in MRI , additional agents added overnight.   11/5 completed 4th session of PLEX 11/7 due for 5/5 PLEX. Afternoon R blown pupil. No etiology on imaging.  11/8 trach 11/12 Rhythmic movements concerned for seizures, fever 102. Neuro evaluated for dyskinesia 11/13 Overnight continued to have dyskinetic movements associated with tachycardia. EEG with no seizure. 11/16 LP with 41 WBC 96 lymphocytes   Micro/labs: 10/16 influenza AB PCR negative, SARS-CoV-2 PCR negative 10/16 HIV antibody negative 10/16 urine culture multiple species suggest recollection 10/16 urine drug screen negative, alcohol less than 10 10/17 CSF lymphocytic pleocytosis, fungal culture negative, CSF culture negative 10/17 CSF HSV 1 DNA negative, HSV-2 DNA negative, VZV PCR CSF negative 10/17 CSF cryptococcal  antigen negative, cryptococcal antigen titer not indicated 10/17 CSF EBV positive  10/17 CMV qualitative CSF negative 10/17 NMDA IgG on CSF > 1:2560 10/18 HIV RNA quant less than 20 10/20 NMDA IgG serum: positive 1:1000 10/22 RPR nonreactive 10/22 QuantiFERON gold intermediate 10/23 EBV DNA QuantiFERON by PCR positive, less than 35 10/25 hepatitis B surface antigen nonreactive, hepatitis B core total antibody positive, hepatitis B antibody positive, hepatitis B DNA not detected 10/25 hepatitis C antibody negative 10/26 blood culture no growth to date 11/2 Tracheal aspirate abundant MSSA   Imaging: 10/16 CT head negative 10/16 MRI brain incomplete study due to patient intolerance without evidence of acute intracranial abnormality 10/22 chest abdomen pelvis CT scan no acute intrathoracic abdominal or pelvic pathology, no evidence of malignancy 10/22 MRI brain normal MRI of the brain 10/26 pelvic ultrasound right ovary not visualized, normal appearance of left ovary and uterus, trace free fluid in pelvis 10/27 pelvic MRI: Normal pelvic MRI, symmetric normal ovaries, no adnexal masses 10/31 MRI > no acute infarct or hemorrhage. Normal MRI.  11/7 CT H no acute abnormality 11/7 MRI brain no acute process  11/13 EEG suggestive of moderate to severe diffuse encephalopathy, likely related to NMDA encephalitis. No seizures or epileptiform discharges  11/14 Doppler ultrasound lower extremities negative for DVT  Procedures: Trach 11/8 >>  R IJ HD catheter 10/29 >>  RUE PICC 11/1 >> Trach 11/8 LP 11/16 > WBC 41 Lymphs 96  Interim History / Subjective:  Fent and ativan overnight for dyskinesias   No respiratory effort on PSV today   Objective   Blood pressure 101/64, pulse (!) 108, temperature 99.9 F (37.7 C), temperature source Axillary, resp. rate 18, height 5\' 2"  (1.575 m), weight 54.4 kg, last menstrual period  01/11/2021, SpO2 100 %.    Vent Mode: PRVC FiO2 (%):  [30 %] 30 % Set  Rate:  [18 bmp] 18 bmp Vt Set:  [400 mL] 400 mL PEEP:  [5 cmH20] 5 cmH20 Plateau Pressure:  [10 cmH20-19 cmH20] 19 cmH20   Intake/Output Summary (Last 24 hours) at 03/11/2021 1024 Last data filed at 03/11/2021 1000 Gross per 24 hour  Intake 2308.47 ml  Output 1825 ml  Net 483.47 ml   Filed Weights   03/06/21 0146 03/07/21 0359 03/10/21 0400  Weight: 56.2 kg 56.1 kg 54.4 kg   Physical Exam: General: WDWN critically ill appearing young adult F trach/vent NAD  HEENT: NCAT. Trach secure. Pink mm. Clear oral secretions  Neuro: Disconjugate gaze. R pupil 97mm not reactive L pupil 63mm.  CV: tachycardic, regular. S1s2 cap refill brisk PULM: CTAb symmetrical chest expansion mechanically ventilated  GI: soft ndnt + bowel sounds  Extremities: no acute joint deformity.  Skin: c/d/w. Unchanged regularly patterned hyperpigmentation on BLE.   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  -MRI pelvis neg for ovarian teratoma.  -R eye mydriasis 11/7 with negative imaging.  - Intermittent dyskinsias with negative EEG.   -LP 11/16 with 41 WBC, 96 lymphocytes, 70 glu. EBV, CMV, HSV, MTB NAAT pending  P -Neuro following, appreciate recs  -follow LP  -cont AEDs: keppra, VPA, gabapentin, [facility] clonazepam  -PRN fent for dyskinesias  -Cont rituximab -- will clarify with neuro RE date for next dose   Fever Tachycardia -autonomic dysfunction in setting of encephalitis vs infection -- favor encephalitis  -Bcx 1/4 staph epi likely contaminant  P -No abx right now  -follow LP -would have a low threshold to dc PICC  for line holiday.   Acute pericarditis  Small pericardial effusion, improved  Small pericardial effusion seen on ECHO 11/3, resolved 11/11 ECHO P -Appreciate cards recs  -3 month course of colchicine, follow up echo at that time   Anemia of critical illness  -Follow CBC -transfuse for Hgb <7%   Hashimoto thyroiditis  -synthroid   Hypokalemia  -Monitoring BMP -Replace K and mag   Inadequate PO intake  -EN per RDN via cortrak -Appropriate to consider PEG   Goals of Care -Long conversation with pt mother and pt aunt who is a physician re clinical course and updates 11/17.  -sounds like a prolonged recovery course should be expected if no concomitant complicating features  -will reach out IR or CCS re PEG (d/w mother 1/17) -will get PIVs and see if we can move toward a line holiday (PICC placed 11/1)  -wonder if we may be reaching a point in the coming week or so where LTAC dispo planning might be appropriate  Best Practice (right click and "Reselect all SmartList Selections" daily)  Diet/type: tubefeeds DVT prophylaxis: LMWH GI prophylaxis: PPI Lines: Central line (PICC placed 11/1) Foley:  Yes, and it is still needed -- needs to be placed 11/17 after series of I/O caths  Code Status:  full code Last date of multidisciplinary goals of care discussion: Mother updated daily. Full code full scope of  intervention   CRITICAL CARE Performed by: Cristal Generous   Total critical care time: 76 minutes  Critical care time was exclusive of separately billable procedures and treating other patients. Critical care was necessary to treat or prevent imminent or life-threatening deterioration.  Critical care was time spent personally by me on the following activities: development of treatment plan with patient and/or surrogate as well as nursing, discussions with consultants, evaluation of patient's response to treatment, examination of patient, obtaining history from patient or  surrogate, ordering and performing treatments and interventions, ordering and review of laboratory studies, ordering and review of radiographic studies, pulse oximetry and re-evaluation of patient's condition.  Eliseo Gum MSN, AGACNP-BC [place] for pager  03/11/2021, 10:24 AM

## 2021-03-11 NOTE — Progress Notes (Addendum)
[facility] Progress Note Patient Name: Jamie [name] DOB: Jul 29, 2000 MRN: [id number]   Date of Service 03/11/21 03/11/2021  HPI/Events of Note Patient with extremity movements not improved by several Fentanyl boluses.   eICU Interventions Ativan 1 mg iv x 1 ordered. Patient back to baseline following Ativan 1 mg dose.         [name] [name] 03/11/2021, 4:06 AM

## 2021-03-11 NOTE — Progress Notes (Signed)
[facility]    Date of Admission:  02/06/2021   Total days of antibiotics 13     Principal Problem:   Encephalitis Active Problems:   Primary hypothyroidism   Catatonia associated with another mental disorder   Acute respiratory failure with hypoxia (HCC)   Status post tracheostomy [facility])   Assessment: 20 year old female born in [place] 16 years ago with Hashimoto's thyroiditis, admitted for encephalopathy and catatonia with floridly positive anti-NMDA antibodies in CSF(1:2560) consistent with anti-NMDA encephalitis. She ddi not respond to corticosteroids and developed seizures on IVIG. She underwent plasmapheresis and now started on Rituxan(1 dose on 03/04/21). 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 Tenafavir was started as pt had isolated HBV reactive as rituxan was started. ID re-engaged due to persistent fevers.   #Likely NMDA encephalitis SP corticosteroids, IVIG developing seizures->plasmapheresis x 5days->rituxanx1 dose -As autonomic dysfunction (including fever) is consistent with anti-nmda encephalitis. I agree with CSF titers as it may help correlate treatment response -I spoke to her mother today and she reports pt had not had episode of illness like this. She is up to date on her vaccinations. At the time of entry to the [place] from [place], pt's tb screen was negative.  Recommendations:  -If LP is done would add MTB NAAT in addition to NMDA titer, HSV pending -ID will sign off  #MSSA VAP-treated -Treated with 10 days of appropriate antibiotics, vancomycin ->cefazolin   #IGRA was indeterminate  on 10/22 in the setting of IVIG -Repeat IGRA on 02/20/21 negative.  #1/4 bottle + Staph epi-consistent with contamination -hold antibiotics   Microbiology:    Antibiotics: Vancomycin 11/2-4 Cefazolin 11/3-11/10  Cultures: Blood Cx:  11/12 1/4 bottles Staph epi Resp Cx: trach aspirate 11/02 MSSA  Subjective: Pt is resting in bed.Spoke to her mother ,relayed that we do not suspect infectious etiology is likely.   Review of Systems: Review of Systems  Unable to perform ROS: Intubated   Past Medical History:  Diagnosis Date   Acquired autoimmune hypothyroidism    Dx 12/2014, TSH 110, FT4 0.3    Social History   Tobacco Use   Smoking status: Never   Smokeless tobacco: Never  Substance Use Topics   Alcohol use: Not Currently   Drug use: Not Currently    Family History  Problem Relation Age of Onset   Healthy Mother    Healthy Father    Scheduled Meds:  chlorhexidine gluconate (MEDLINE KIT)  15 mL Mouth Rinse BID   Chlorhexidine Gluconate Cloth  6 each Topical Daily   clonazePAM  0.5 mg Per Tube TID   clonazepam  1 mg Per Tube QHS   colchicine  0.6 mg Per Tube Daily   docusate  100 mg Per Tube BID   enoxaparin (LOVENOX) injection  40 mg Subcutaneous Q24H   feeding supplement (PROSource TF)  45 mL Per Tube TID   gabapentin  300 mg Per Tube Q8H   levothyroxine  100 mcg Per Tube Q0600   mouth rinse  15 mL Mouth Rinse 10 times per day   metoprolol tartrate  25 mg Per Tube TID   pantoprazole sodium  40 mg Per Tube QHS   polyethylene glycol  17 g Per Tube BID   scopolamine  1 patch Transdermal Q72H   senna-docusate  2 tablet Per NG tube BID  sodium chloride flush  10-40 mL Intracatheter Q12H   tenofovir  300 mg Per Tube Daily   thiamine  100 mg Per Tube Daily   traMADol  50 mg Per Tube Q6H   valproic acid  500 mg Per Tube Q8H   Continuous Infusions:  sodium chloride 10 mL/hr at 03/08/21 0800   famotidine (PEPCID) IV     feeding supplement (VITAL 1.5 CAL) 1,000 mL (03/08/21 0630)   levETIRAcetam 1,500 mg (03/08/21 1111)   sodium chloride     PRN Meds:.albuterol, atropine, diphenhydrAMINE, famotidine (PEPCID) IV, fentaNYL (SUBLIMAZE) injection, LORazepam, morphine injection, sodium chloride, sodium  chloride flush No Known Allergies  OBJECTIVE: Blood pressure 92/65, pulse (!) 112, temperature (!) 96.9 F (36.1 C), temperature source Axillary, resp. rate 18, height _0  (1.575 m), weight 56.1 kg, last menstrual period 01/11/2021, SpO2 99 %.  Physical Exam Constitutional:      Comments: Trach,  does not repond to commands  HENT:     Head: Normocephalic and atraumatic.     Right Ear: External ear normal.     Left Ear: External ear normal.     Nose: Nose normal.     Mouth/Throat:     Mouth: Mucous membranes are moist.  Eyes:     Extraocular Movements: Extraocular movements intact.     Conjunctiva/sclera: Conjunctivae normal.     Pupils: Pupils are equal, round, and reactive to light.  Cardiovascular:     Rate and Rhythm: Normal rate and regular rhythm.     Heart sounds: No murmur heard.   No friction rub. No gallop.  Pulmonary:     Effort: Pulmonary effort is normal.     Breath sounds: Normal breath sounds.  Abdominal:     General: Abdomen is flat.     Palpations: Abdomen is soft.  Skin:    General: Skin is warm and dry.     Comments: 1 inch marks from chest to middle of lower extremities  Neurological:     General: No focal deficit present.     Mental Status: She is oriented to person, place, and time.  Psychiatric:        Mood and Affect: Mood normal.    Lab Results Lab Results  Component Value Date   WBC 10.9 (H) 03/08/2021   WBC 10.7 (H) 03/08/2021   HGB 10.3 (L) 03/08/2021   HGB 10.3 (L) 03/08/2021   HCT 34.0 (L) 03/08/2021   HCT 34.0 (L) 03/08/2021   MCV 95.5 03/08/2021   MCV 97.1 03/08/2021   PLT 511 (H) 03/08/2021   PLT 535 (H) 03/08/2021    Lab Results  Component Value Date   CREATININE 0.37 (L) 03/08/2021   BUN 24 (H) 03/08/2021   NA 143 03/08/2021   K 3.9 03/08/2021   CL 104 03/08/2021   CO2 31 03/08/2021    Lab Results  Component Value Date   ALT 11 03/06/2021   AST 21 03/06/2021   ALKPHOS 32 (L) 03/06/2021   BILITOT 0.3 03/06/2021        Laurice Record, [facility] 03/08/2021, 2:15 PM

## 2021-03-11 NOTE — Progress Notes (Signed)
Pt temp >103, still having dyskinesia movements, heart rate >160. All medications given, PRNs given, ice applied.  CCM and neuro aware. Mother is extremely distraught.

## 2021-03-11 NOTE — Progress Notes (Signed)
Notified lab that stat H & H was being sent down at this time.

## 2021-03-11 NOTE — Progress Notes (Signed)
At bedside for PICC removal. PICC removed easily without difficulty. Site intact and patent with no drainage noted. Tip end intact ans straight across. Documentation reflects a PICC length of 40 cm, however, 38 cm was noted upon removal. Chest film illustrates a straight, intact tip end, and the ECG line confirmation demonstrates a length of 38 cm. Covered with an occlusive dressing and Vaseline gauze.

## 2021-03-11 NOTE — Progress Notes (Signed)
S: Called to bedside for tachycardia, worsened dyskinetic movements and refractory fever.   O: BP (!) 151/71   Pulse (!) 172   Temp (!) 103.5 F (39.7 C) Comment: ice packs on, MD aware  Resp (!) 25   Ht 5' 2"  (1.575 m)   Wt 54.4 kg   LMP 01/11/2021 Comment: neg preg test  SpO2 99%   BMI 21.94 kg/m   General: Skin: Warm and moist throughout Ext: No edema  Neurological: Ment: Unresponsive to all external stimuli. Eyes remain closed to all stimuli.  CN: Right pupil 4 mm and unreactive. Left pupil 3 mm and reactive. Prominent dyskinetic movements of perioral muscles noted throughout the exam.  Motor: Decreased tone x 4 with the exception of dyskinetic movements.  Dyskinetic movements of BLE. No movement with noxious plantar stimulation.  BUE without movements spontaneously or to pinch.  Sensory: Not responding to light noxious stimuli Reflexes: Hypoactive throughout.   Medications:  chlorhexidine gluconate (MEDLINE KIT)  15 mL Mouth Rinse BID   Chlorhexidine Gluconate Cloth  6 each Topical Daily   clonazePAM  0.5 mg Per Tube TID   clonazepam  1 mg Per Tube QHS   colchicine  0.6 mg Per Tube Daily   enoxaparin (LOVENOX) injection  40 mg Subcutaneous Q24H   feeding supplement (PROSource TF)  45 mL Per Tube TID   gabapentin  300 mg Per Tube Q8H   levothyroxine  100 mcg Per Tube Q0600   mouth rinse  15 mL Mouth Rinse 10 times per day   metoprolol tartrate  25 mg Per Tube BID   modafinil  100 mg Per Tube Daily   pantoprazole sodium  40 mg Per Tube QHS   polyethylene glycol  17 g Per Tube BID   scopolamine  1 patch Transdermal Q72H   sodium chloride flush  10-40 mL Intracatheter Q12H   tenofovir  300 mg Per Tube Daily   thiamine  100 mg Per Tube Daily   traMADol  50 mg Per Tube Q6H   valproic acid  500 mg Per Tube Q8H    Assessment: 20 y.o. 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 intake for 4 days  and decreased verbalization 10/15 prior to arrival. Initial concerns for Hashimoto's encephalitis but extensive workup revealed NMDA receptor encephalitis. Imaging of chest and abdomen, as well as multimodality imaging of the pelvis has been negative. She has since completed 5 days of IV Solu-Medrol, 5 days of IVIG, 5 days of Plex and 1 dose of Rituxan on 03/04/2021. Neurology called to bedside for evaluation of fever, tachycardia and dyskinesias.  - Refractory autonomic dysfunction and dyskinetic movements. Overall findings are most consistent with Paroxysmal Sympathetic Hyperactivity (also known as Sympathetic Storm, Hypothalamic Dysregulation Syndrome or Autonomic Storm).  - Management of Paraxoysmal Sympathetic Hyperactivity per UpToDate: "We often use intravenous morphine (starting dose 2 mg) to abort the episodes that last longer than a few minutes, along with gabapentin (starting dose 100 to 300 mg thrice daily) with a noncardioselective beta blocker (eg, propranolol, at a starting dose of 10 mg thrice daily) and/or clonidine at a starting dose of 0.1 mg twice daily to prevent recurrent episodes [1,4]. Frequent dose titrations of medications are often necessary over the initial days before good control is achieved. The use of these agents is frequently limited by hypotension. Benzodiazepines and baclofen can be helpful adjuncts in these cases; bromocriptine has also been reported  to be useful as a preventive drug. Antidopaminergic drugs should be avoided. Dantrolene, a potent muscle relaxant, should be reserved for patients with refractory posturing resulting in muscle contractures."  Recommendations: - Currently on Klonopin 0.5 mg TID, 1 mg qhs per tube. Continue.  - Also on Neurontin 300 mg per tube TID. Continue.  - Cooling blanket now - Stopping modafanil - Administer 2 mg IV Ativan x 1 now.  - IV morphine 2 mg x 1 now - Adding clonidine liquid formulation 0.1 mg per tube BID, first dose  now  40 minutes spent in the emergent neurological evaluation and management of this critically ill patient. Time spent included coordination of care.   Electronically signed: Dr. Kerney Elbe

## 2021-03-11 NOTE — Progress Notes (Signed)
[facility] ADULT ICU REPLACEMENT PROTOCOL   The patient does apply for the [facility] Adult ICU Electrolyte Replacment Protocol based on the criteria listed below:   1.Exclusion criteria: TCTS patients, ECMO patients, and Dialysis patients 2. Is GFR >/= 30 ml/min? Not calculated  Patient's GFR today is no calculated 3. Is SCr </= 2? Yes.   Patient's SCr is <0.30 mg/dL 4. Did SCr increase >/= 0.5 in 24 hours? No. 5.Pt's weight >40kg  Yes.   6. Abnormal electrolyte(s): K+ 3.1, mag 1.7  7. Electrolytes replaced per protocol 8.  Call MD STAT for K+ </= 2.5, Phos </= 1, or Mag </= 1 Physician:  n/a   Jamie [name] 03/11/2021 6:10 AM

## 2021-03-11 NOTE — Progress Notes (Signed)
   03/11/21 1210  Clinical Encounter Type  Visited With Patient and family together  Visit Type Spiritual support  Referral From Other (Comment)  Consult/Referral To Chaplain   Chaplain Jorene Guest responded to page. The patient's mother [place] requested for Ike Bene to pray with her. Chaplain also provided hospitality, gave Ms.Jordan cup of coffee. This note was prepared by Jeanine Luz, M.Div..  For questions please contact by phone [phone].

## 2021-03-12 DIAGNOSIS — J9601 Acute respiratory failure with hypoxia: Secondary | ICD-10-CM | POA: Diagnosis not present

## 2021-03-12 DIAGNOSIS — G0490 Encephalitis and encephalomyelitis, unspecified: Secondary | ICD-10-CM | POA: Diagnosis not present

## 2021-03-12 LAB — BASIC METABOLIC PANEL
Anion gap: 9 (ref 5–15)
BUN: 11 mg/dL (ref 6–20)
CO2: 27 mmol/L (ref 22–32)
Calcium: 8.6 mg/dL — ABNORMAL LOW (ref 8.9–10.3)
Chloride: 101 mmol/L (ref 98–111)
Creatinine, Ser: 0.35 mg/dL — ABNORMAL LOW (ref 0.44–1.00)
GFR, Estimated: >60 mL/min (ref >60)
Glucose, Bld: 128 mg/dL — ABNORMAL HIGH (ref 70–99)
Potassium: 4.1 mmol/L (ref 3.5–5.1)
Sodium: 137 mmol/L (ref 135–145)

## 2021-03-12 LAB — GLUCOSE, CAPILLARY
Glucose-Capillary: 113 mg/dL — ABNORMAL HIGH (ref 70–99)
Glucose-Capillary: 118 mg/dL — ABNORMAL HIGH (ref 70–99)
Glucose-Capillary: 119 mg/dL — ABNORMAL HIGH (ref 70–99)
Glucose-Capillary: 126 mg/dL — ABNORMAL HIGH (ref 70–99)
Glucose-Capillary: 128 mg/dL — ABNORMAL HIGH (ref 70–99)
Glucose-Capillary: 145 mg/dL — ABNORMAL HIGH (ref 70–99)

## 2021-03-12 LAB — CBC
HCT: 27.5 % — ABNORMAL LOW (ref 36.0–46.0)
Hemoglobin: 8.3 g/dL — ABNORMAL LOW (ref 12.0–15.0)
MCH: 28.8 pg (ref 26.0–34.0)
MCHC: 30.2 g/dL (ref 30.0–36.0)
MCV: 95.5 fL (ref 80.0–100.0)
Platelets: 343 10*3/uL (ref 150–400)
RBC: 2.88 MIL/uL — ABNORMAL LOW (ref 3.87–5.11)
RDW: 17.2 % — ABNORMAL HIGH (ref 11.5–15.5)
WBC: 11 10*3/uL — ABNORMAL HIGH (ref 4.0–10.5)
nRBC: 0 % (ref 0.0–0.2)

## 2021-03-12 LAB — PHOSPHORUS: Phosphorus: 3 mg/dL (ref 2.5–4.6)

## 2021-03-12 LAB — CSF IGG: IgG, CSF: 4.6 mg/dL (ref 0.0–6.7)

## 2021-03-12 LAB — PATHOLOGIST SMEAR REVIEW: Path Review: INCREASED

## 2021-03-12 LAB — HSV 1/2 PCR, CSF
HSV-1 DNA: NEGATIVE
HSV-2 DNA: NEGATIVE

## 2021-03-12 LAB — MAGNESIUM: Magnesium: 2.2 mg/dL (ref 1.7–2.4)

## 2021-03-12 MED ORDER — MORPHINE SULFATE (PF) 4 MG/ML IV SOLN
4.0000 mg | Freq: Once | INTRAVENOUS | Status: AC
  Administered 2021-03-12: 4 mg via INTRAVENOUS
  Filled 2021-03-12: qty 1

## 2021-03-12 MED ORDER — MORPHINE SULFATE (PF) 4 MG/ML IV SOLN
4.0000 mg | INTRAVENOUS | Status: DC | PRN
  Administered 2021-03-12 – 2021-03-16 (×12): 4 mg via INTRAVENOUS
  Filled 2021-03-12 (×12): qty 1

## 2021-03-12 MED ORDER — CLONIDINE HCL 0.1 MG PO TABS
0.1000 mg | ORAL_TABLET | Freq: Two times a day (BID) | ORAL | Status: DC
  Administered 2021-03-12 – 2021-03-16 (×10): 0.1 mg
  Filled 2021-03-12 (×10): qty 1

## 2021-03-12 MED ORDER — MORPHINE SULFATE (PF) 2 MG/ML IV SOLN
2.0000 mg | Freq: Once | INTRAVENOUS | Status: AC
  Administered 2021-03-12: 2 mg via INTRAVENOUS
  Filled 2021-03-12: qty 1

## 2021-03-12 NOTE — Progress Notes (Signed)
Subjective: Had worsening tachycardia and dyskinetic movements as well as temperature of 104 yesterday evening.  Was evaluated by Dr. Cheral Marker and started on clonidine. ROS: Unable to obtain due to poor mental status  Examination  Vital signs in last 24 hours: Temp:  [96.6 F (35.9 C)-104 F (40 C)] 97.5 F (36.4 C) (11/18 0800) Pulse Rate:  [93-175] 121 (11/18 0800) Resp:  [0-30] 18 (11/18 0800) BP: (70-151)/(22-82) 116/69 (11/18 0800) SpO2:  [96 %-100 %] 100 % (11/18 0800) FiO2 (%):  [30 %] 30 % (11/18 0141)  General: lying in bed, NAD CVS: pulse-normal rate and rhythm RS: +trach, coarse breath sounds bilaterally Extremities: normal, +edema  Neuro:  Does not open eyes to noxious stimuli, right pupil 3 mm, left pupil 2 mm, +hippus BL,  no apparent facial asymmetry, corneal reflex intact, does not withdraws to noxious stimuli in all extremities  Basic Metabolic Panel: Recent Labs  Lab 03/09/21 0352 03/10/21 0337 03/11/21 0424 03/11/21 0601 03/12/21 0345  NA 140 138 139 139 137  K 3.1* 3.6 3.1* 3.1* 4.1  CL 101 103 107 101 101  CO2 28 28 27 31 27   GLUCOSE 92 113* 120* 125* 128*  BUN 25* 17 9 10 11   CREATININE 0.38* 0.43* <0.30* 0.42* 0.35*  CALCIUM 9.1 8.8* 7.4* 8.6* 8.6*  MG 2.2 2.0 1.7 1.9 2.2  PHOS  --   --   --   --  3.0    CBC: Recent Labs  Lab 03/08/21 0506 03/09/21 0352 03/10/21 0337 03/11/21 0424 03/11/21 0601 03/12/21 0345  WBC 10.7*  10.9* 10.5 8.7 7.2  --  11.0*  NEUTROABS 7.7  --   --   --   --   --   HGB 10.3*  10.3* 9.1* 8.9* 5.3* 8.3* 8.3*  HCT 34.0*  34.0* 30.0* 29.8* 18.4* 27.5* 27.5*  MCV 97.1  95.5 94.9 96.1 99.5  --  95.5  PLT 535*  511* 423* 436* 236  --  343     Coagulation Studies: No results for input(s): LABPROT, INR in the last 72 hours.  Imaging No new brain imaging overnight     ASSESSMENT AND PLAN: 20 y.o. female with PMHx of Hashimoto thyroiditis who presented to the ED 10/15 for evaluation of vomiting, headache, poor  p.o. intake for 4 days and decreased verbalization 10/15 prior to arrival. 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  She has since completed 5 days of IV Solu-Medrol, 5 days of IVIG, 5 days of Plex and 1 dose of Rituxan on 03/04/2021.     Summary of work-up so far    Thyroperoxidase antibody 68 Thyroglobulin antibody 57.6 Lumbar puncture on 02/08/2021: 318 WBCs (94 lymphocytes, 0 neutrophils), total protein 50, glucose 68, fungal culture negative, negative HSV 1 and 2 DNA, negative VZV, EBV qualitative positive, CMV CSF negative, cryptococcal antigen negative, NMDA CSF  1:1280,  3 oligoclonal bands, IgG CSF 10.5, IgG/albumin ratio in CSF 0.40, CSF IgG index 1.4, CSF path showed lymphocytosis NMDA IgG  >1:2560, NMDA antibody titer 1:1000 Serum RPR negative, quantifying TB Gold negative EBV DNA by PCR <35 ANA positive dsDNA antibody negative Ribonucleoprotein negative  ENA SN negative Scleroderma antibody negative SSA 1.7 SSB negative  chromatin antibody negative  anti-Jo 1 antibody negative  centromere antibody screen negative JC virus DNA PCR, blood negative Protein electrophoresis, serum showed Albumin-ELP 2.7 (2.9-4.4) and gammaglobulin 2.6 ( 0.4-1.8) IgG immunoglobulin in serum 2748 ( [id number]) Hepatitis B  surface antigen nonreactive Hepatitis B core antibody reactive Hepatitis B post: 856 Hepatitis C Antibody negative EBV VCA IgM normal, IgG >600 ( 0-17.9) and EBV NA IgG >600 (0-17.9) Repeat LP showed 41 wbc ( 96 lymphocytes)  CT Chest Abdo pelvis 02/13/2021: No acute intrathoracic, abdominal, or pelvic pathology. No evidence of malignancy.  US pelvis transabdominal 02/17/2021: Right ovary not visualized. Normal appearance of left ovary and uterus.Trace free fluid in the pelvis.  MR pelvis with and without contrast 02/18/2021: Normal pelvic MRI.  Symmetric normal ovaries. No adnexal masses.  LTM  EEG 03/07/2021:  Description: EEG initially showed continuous generalized high amplitude rhythmic 1 to 3 Hz delta slowing with overriding 15 to 18 Hz beta activity consistent with delta brush.   Multiple episodes were captured during which patient had eyebrow twitching, mouth movements as well as bilateral upper extremity twitching.  Concomitant eeg showed continuous generalized high amplitude 1 to 3 Hz-delta slowing with overriding 15-18Hz  fronto-central beta activity ( extreme delta brush).    ABNORMALITY - Delta brush, generalized - Continuous slow, generalized   IMPRESSION: This study is suggestive of moderate to severe diffuse encephalopathy, nonspecific etiology but likely secondary to NMDA encephalitis. No definite seizures or epileptiform discharges were seen during this study.   Multiple episodes were captured during which patient had eyebrow twitching, mouth movements as well as bilateral upper extremity twitching.  Concomitant eeg didn't  show any change to suggest seizure. These episodes are more likely not epileptic. Dyskinesias is in the differential.     NMDA encephalitis Acute encephalopathy, due to NMDA encephalitis and seizures New onset seizures Acute respiratory failure Dyskinesias Right eye mydriasis ( improving) Recurrent fever Sympathetic storming -Continues to have difficult to control dyskinesias as well as sympathetic storming  Recommendation -Continue Klonopin 0.5 mg at 0700, 1200, 1700 and continue Klonopin 1 mg nightly, Depakote 500 mg every 8 hours and Keppra 1500 mg twice daily -Also on gabapentin 300 mg 3 times daily, clonidine 0.1 mg twice daily -Plan for second dose of rituximab on 03/20/2021 if patient remains comatose (Of note patient did get 1 dose before Plex as well) -Contacted [facility] to discuss the case and possibly request transfer again today - management of rest per critical care team -Discussed assessment and plan in great detail with mother  at bedside    I have spent a total of 55 minutes with the patient reviewing hospital notes,  test results, labs and examining the patient as well as establishing an assessment and plan  > 50% of time was spent in direct patient care.     Zeb Comfort Epilepsy [facility] For questions after 5pm please refer to AMION to reach the Neurologist on call

## 2021-03-12 NOTE — Progress Notes (Signed)
After similar instance as the one that occurred earlier today. (See previous note from RN). 50 mcg bolus of fentanyl, 2mg  IV ativan and 4mg  of IV morphine were given and the patient is still having dyskinetic movements and increased heart rate.  Alerted the neuro MD, Dr. Leonel Ramsay, to this and he ordered that I go ahead and give 4 more mg of IV morphine.

## 2021-03-12 NOTE — Progress Notes (Addendum)
Pt. is tachycardic HR >140 and tachypnic RR >40 as well as having obvious dyskinesia. I gave her a bolus of fentanyl that didn't help and then I gave her ativan. I alerted Dr. Hortense Ramal to this.  She then gave me orders to give an additional 2mg  IV Ativan.  This medication didn't change the patient's vital signs or movements.  Dr. Hortense Ramal then ordered 4mg  morphine IV to be given as a one time dose.   Will carry out orders and continue to observe and act accordingly.

## 2021-03-12 NOTE — Progress Notes (Signed)
NAME:  Jamie [name], MRN:  [id number], DOB:  Nov 09, 2000, LOS: 46 ADMISSION DATE:  02/06/2021, CONSULTATION DATE:  10/28 REFERRING MD:  Raymondo Band, CHIEF COMPLAINT:  Seizure, acute metabolic encephalopathy   History of Present Illness:  20 y/o female admitted with seizure and change in mental status.  She was treated with steroids and IVIg but despite this her condition worsened.  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  After treatment her mental status worsened and she developed inability to protect her airway in the setting of tachypnea and tachycardia.  She required intubation on 10/28, trach 11/8.   Pertinent  Medical History  Acquired autoimmune hypothyroidism  Significant Hospital Events: Including procedures, antibiotic start and stop dates in addition to other pertinent events   10/16 Admit 10/25 New onset seizure activity, PCCM consult 1026 pulmonary critical care signed off 02/19/2021 pulmonary critical care reconsulted for suspected respiratory distress.  intubated, continuous EEG started 10/30 starting plasmapheresis 10/31 MRI normal, EEG with GPD+ 11/1 RN reports pt seized while in MRI , additional agents added overnight.   11/5 completed 4th session of PLEX 11/7 due for 5/5 PLEX. Afternoon R blown pupil. No etiology on imaging.  11/8 trach 11/12 Rhythmic movements concerned for seizures, fever 102. Neuro evaluated for dyskinesia 11/13 Overnight continued to have dyskinetic movements associated with tachycardia. EEG with no seizure. 11/16 LP with 41 WBC 96 lymphocytes   Micro/labs: 10/16 influenza AB PCR negative, SARS-CoV-2 PCR negative 10/16 HIV antibody negative 10/16 urine culture multiple species suggest recollection 10/16 urine drug screen negative, alcohol less than 10 10/17 CSF lymphocytic pleocytosis, fungal culture negative, CSF culture negative 10/17 CSF HSV 1 DNA negative, HSV-2 DNA negative, VZV PCR CSF negative 10/17 CSF cryptococcal  antigen negative, cryptococcal antigen titer not indicated 10/17 CSF EBV positive  10/17 CMV qualitative CSF negative 10/17 NMDA IgG on CSF > 1:2560 10/18 HIV RNA quant less than 20 10/20 NMDA IgG serum: positive 1:1000 10/22 RPR nonreactive 10/22 QuantiFERON gold intermediate 10/23 EBV DNA QuantiFERON by PCR positive, less than 35 10/25 hepatitis B surface antigen nonreactive, hepatitis B core total antibody positive, hepatitis B antibody positive, hepatitis B DNA not detected 10/25 hepatitis C antibody negative 10/26 blood culture no growth to date 11/2 Tracheal aspirate abundant MSSA   Imaging: 10/16 CT head negative 10/16 MRI brain incomplete study due to patient intolerance without evidence of acute intracranial abnormality 10/22 chest abdomen pelvis CT scan no acute intrathoracic abdominal or pelvic pathology, no evidence of malignancy 10/22 MRI brain normal MRI of the brain 10/26 pelvic ultrasound right ovary not visualized, normal appearance of left ovary and uterus, trace free fluid in pelvis 10/27 pelvic MRI: Normal pelvic MRI, symmetric normal ovaries, no adnexal masses 10/31 MRI > no acute infarct or hemorrhage. Normal MRI.  11/7 CT H no acute abnormality 11/7 MRI brain no acute process  11/13 EEG suggestive of moderate to severe diffuse encephalopathy, likely related to NMDA encephalitis. No seizures or epileptiform discharges  11/14 Doppler ultrasound lower extremities negative for DVT  Procedures: Trach 11/8 >>  R IJ HD catheter 10/29 >>  RUE PICC 11/1 >> Trach 11/8 LP 11/16 > WBC 41 Lymphs 96  Interim History / Subjective:  Sympathetic storming overnight   Mother is very concerned about the multiple medications that are indicated for management.  Objective   Blood pressure 122/73, pulse (!) 119, temperature (!) 96.8 F (36 C), resp. rate 18, height 5\' 2"  (1.575 m), weight 54.4 kg, last menstrual  period 01/11/2021, SpO2 100 %.    Vent Mode: PRVC FiO2 (%):   [30 %] 30 % Set Rate:  [18 bmp] 18 bmp Vt Set:  [400 mL] 400 mL PEEP:  [5 cmH20] 5 cmH20 Plateau Pressure:  [17 cmH20-21 cmH20] 18 cmH20   Intake/Output Summary (Last 24 hours) at 03/12/2021 0715 Last data filed at 03/12/2021 0600 Gross per 24 hour  Intake 2376.16 ml  Output 970 ml  Net 1406.16 ml   Filed Weights   03/06/21 0146 03/07/21 0359 03/10/21 0400  Weight: 56.2 kg 56.1 kg 54.4 kg   Physical Exam: General: WDWN critically ill young adult F, sedated NAD  HEENT: NCAT trach secure. Pink mmm anicteric sclera  Neuro: R pupil 40mm L pupil 35mm. Does not respond to noxious stimuli.  CV: tachycardic, regular. 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  -MRI pelvis neg for ovarian teratoma.  -R eye mydriasis 11/7 with negative imaging.  - Intermittent dyskinsias with negative EEG.   -LP 11/16 with 41 WBC, 96 lymphocytes, 70 glu. EBV, CMV, HSV, MTB NAAT pending  P -Neuro following, appreciate insight and assistance  - cont AEDs  -TID clonazepam, TID neuronin, ativan, morphine, Clonidine for autonomic storming -cooling blanket as needed -follow LP -Next dose rituximab end of next week   Fever, tachycardia -favor this to be autonomic storming as  above, doubt infectious -HD cath out 11/14, PICC out 11/17  P -hold abx. -management as above  -will cont to trend CBC   Acute pericarditis Small pericardial effusion, improved Small pericardial effusion seen on ECHO 11/3, resolved 11/11 ECHO P -3 month course of colchicine, recommended for follow echo at that time   Anemia of critical illness  -Follow CBC -transfuse for Hgb <7%   Hashimoto thyroiditis  -synthroid   Inadequate PO intake  -EN per RDN via cortrak -IR has been consulted RE PEG   Goals of Care -There seems to be a lot of significant anxiety surrounding the dyskinesias and sympathetic hyperactivity. Plan for these has been explained in detail by multiple providers, daily, without improvement in family anxiety/understanding about management. It is not realistic from a time standpoint for CCM to have multiple long discussions each day about this topic as family is requesting (1-3 discussions each day, >30 min each)-- Given how extensively and thoroughly this has been explained thus far, I do wonder if emotional support might be what the family is seeking in these discussions.  P -I will consult palliative care to see if they might be able to add a layer of emotional support  as this complex, prolonged critical illness course is navigated  -full scope of medical intervention   Best Practice (right click and "Reselect all SmartList Selections" daily)  Diet/type: tubefeeds DVT prophylaxis: LMWH GI prophylaxis: PPI Lines: n/a  Foley:  Yes, and it is still needed  Code Status:  full code Last date of multidisciplinary goals of care discussion: Mother updated extensively, daily. Pt aunt also updated extensively   CRITICAL CARE Performed by: Cristal Generous   Total critical care time: 93 minutes  Critical care time was exclusive of separately billable procedures and treating other patients. Critical care was necessary to treat or prevent imminent or life-threatening  deterioration.  Critical care was time spent personally by me on the following activities: development of treatment plan with patient and/or surrogate as well as nursing, discussions with consultants, evaluation of patient's response to treatment, examination of patient, obtaining history from patient or surrogate, ordering and performing treatments and interventions, ordering and review of laboratory studies, ordering and review of radiographic studies, pulse oximetry and re-evaluation of patient's condition.   Eliseo Gum MSN, AGACNP-BC [place] for pager 03/12/2021, 12:10 PM

## 2021-03-13 ENCOUNTER — Inpatient Hospital Stay (HOSPITAL_COMMUNITY): Payer: Medicaid Other

## 2021-03-13 DIAGNOSIS — J9601 Acute respiratory failure with hypoxia: Secondary | ICD-10-CM | POA: Diagnosis not present

## 2021-03-13 DIAGNOSIS — Z515 Encounter for palliative care: Secondary | ICD-10-CM

## 2021-03-13 DIAGNOSIS — Z7189 Other specified counseling: Secondary | ICD-10-CM

## 2021-03-13 DIAGNOSIS — G0490 Encephalitis and encephalomyelitis, unspecified: Secondary | ICD-10-CM | POA: Diagnosis not present

## 2021-03-13 LAB — COMPREHENSIVE METABOLIC PANEL
ALT: 11 U/L (ref 0–44)
AST: 15 U/L (ref 15–41)
Albumin: 2.4 g/dL — ABNORMAL LOW (ref 3.5–5.0)
Alkaline Phosphatase: 52 U/L (ref 38–126)
Anion gap: 8 (ref 5–15)
BUN: 9 mg/dL (ref 6–20)
CO2: 30 mmol/L (ref 22–32)
Calcium: 8.7 mg/dL — ABNORMAL LOW (ref 8.9–10.3)
Chloride: 101 mmol/L (ref 98–111)
Creatinine, Ser: 0.44 mg/dL (ref 0.44–1.00)
GFR, Estimated: >60 mL/min (ref >60)
Glucose, Bld: 179 mg/dL — ABNORMAL HIGH (ref 70–99)
Potassium: 3.3 mmol/L — ABNORMAL LOW (ref 3.5–5.1)
Sodium: 139 mmol/L (ref 135–145)
Total Bilirubin: 0.3 mg/dL (ref 0.3–1.2)
Total Protein: 6.2 g/dL — ABNORMAL LOW (ref 6.5–8.1)

## 2021-03-13 LAB — CBC
HCT: 28 % — ABNORMAL LOW (ref 36.0–46.0)
Hemoglobin: 8.5 g/dL — ABNORMAL LOW (ref 12.0–15.0)
MCH: 28.6 pg (ref 26.0–34.0)
MCHC: 30.4 g/dL (ref 30.0–36.0)
MCV: 94.3 fL (ref 80.0–100.0)
Platelets: 386 10*3/uL (ref 150–400)
RBC: 2.97 MIL/uL — ABNORMAL LOW (ref 3.87–5.11)
RDW: 17.2 % — ABNORMAL HIGH (ref 11.5–15.5)
WBC: 12.6 10*3/uL — ABNORMAL HIGH (ref 4.0–10.5)
nRBC: 0 % (ref 0.0–0.2)

## 2021-03-13 LAB — N-METHYL-D-ASPARTATE RECPT.IGG

## 2021-03-13 LAB — MAGNESIUM: Magnesium: 2 mg/dL (ref 1.7–2.4)

## 2021-03-13 LAB — GLUCOSE, CAPILLARY
Glucose-Capillary: 183 mg/dL — ABNORMAL HIGH (ref 70–99)
Glucose-Capillary: 131 mg/dL — ABNORMAL HIGH (ref 70–99)
Glucose-Capillary: 100 mg/dL — ABNORMAL HIGH (ref 70–99)
Glucose-Capillary: 121 mg/dL — ABNORMAL HIGH (ref 70–99)
Glucose-Capillary: 96 mg/dL (ref 70–99)

## 2021-03-13 MED ORDER — POTASSIUM CHLORIDE 20 MEQ PO PACK
20.0000 meq | PACK | ORAL | Status: AC
  Administered 2021-03-13 (×2): 20 meq
  Filled 2021-03-13 (×2): qty 1

## 2021-03-13 MED ORDER — LOPERAMIDE HCL 1 MG/7.5ML PO SUSP
2.0000 mg | Freq: Three times a day (TID) | ORAL | Status: DC | PRN
  Filled 2021-03-13: qty 15

## 2021-03-13 MED ORDER — TRAMADOL HCL 50 MG PO TABS
25.0000 mg | ORAL_TABLET | Freq: Two times a day (BID) | ORAL | Status: AC
  Administered 2021-03-13 – 2021-03-14 (×3): 25 mg
  Filled 2021-03-13 (×3): qty 1

## 2021-03-13 MED ORDER — POTASSIUM CHLORIDE 10 MEQ/100ML IV SOLN
10.0000 meq | INTRAVENOUS | Status: AC
  Administered 2021-03-13 (×4): 10 meq via INTRAVENOUS
  Filled 2021-03-13 (×4): qty 100

## 2021-03-13 MED ORDER — POTASSIUM CHLORIDE 10 MEQ/50ML IV SOLN: 10.0000 meq | INTRAVENOUS | Status: DC

## 2021-03-13 MED ORDER — SODIUM CHLORIDE 0.9 % IV SOLN
1.5000 g | Freq: Four times a day (QID) | INTRAVENOUS | Status: DC
  Administered 2021-03-13 – 2021-03-15 (×8): 1.5 g via INTRAVENOUS
  Filled 2021-03-13 (×10): qty 4

## 2021-03-13 NOTE — Consult Note (Signed)
Palliative Medicine Inpatient Consult Note  Consulting Provider: Cristal Generous, NP  Reason for consult:   [place] Palliative Medicine Consult  Reason for Consult? complex critical illness   HPI:  Per intake H&P --> 20 y.o. female with PMHx of Hashimoto thyroiditis  admitted with seizure and change in mental status.  She was treated with steroids and IVIg but despite this her condition worsened.  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  After treatment her mental status worsened and she developed inability to protect her airway in the setting of tachypnea and tachycardia.  She required intubation on 10/28, trach 11/8.    Palliative care has been asked to get involved to offer additional family support. The goals are and have been clear for full scope of treatment.   Clinical Assessment/Goals of Care:  *Please note that this is a verbal dictation therefore any spelling or grammatical errors are due to the "[place]" system interpretation.  I have reviewed medical records including EPIC notes, labs and imaging, received report from bedside RN, assessed the patient who is on ventilatory support responsive to noxious stimuli.    I met with patient's mother, [place] to further discuss diagnosis prognosis, GOC, EOL wishes, disposition and options.   I introduced Palliative Medicine as specialized medical care for people living with serious illness. It focuses on providing relief from the symptoms and stress of a serious illness. The goal is to improve quality of life for both the patient and the family.  Dylann lives in an apartment with her mother in [place], [place].  She is of the [place] ethnicity.  She is not married and does not have any children.  She has 1 brother and 1 sister.  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   She loves soccer, basketball, tennis, and swimming.  She is an avid reader per her mother and excelled at reading at an early age.  She is a religious young lady and practices within Christianity.  Prior to hospitalization have been was fully functional of all BADLs and IADLs.  She was driving a new car which she and her mother had bought recently.  Evans mother expresses that Byrd has always been a very special child.  She is always had extreme empathy and a delicate nature and kindness towards others.  Martinique shares that Mayanna was volunteering at an early age which contributed to her overall compassionate nature.    Libertie's mother, Martinique is her primary Media planner.  Ramia is a full code/full scope of care patient. Her mother shares that she has high hopes for recovery.   [place]ique and I discussed heavens complicated [facility]-WTU hospitalization.  We reviewed that it has been trial some in the setting of NMDA receptor encephalitis.  Her mother shares with me the various treatments that she has received so far inclusive of steroids, IVIG, and Rituxan.  While speaking Dr. Leonel Ramsay came into the room.  He had an extensive discussion with Martinique regarding Neida's present state.  He provided a review of medications inclusive of those which were worrisome to Martinique.  Discussed stopping tramadol and the reasons why stopping tramadol would be of benefit to having presently.  Dr. Leonel Ramsay shared that [facility] would be willing to accept Josanne when they have a bed available.  Martinique was encouraged that if this is a possibility moving  forward to proceed with it.  Dr. Leonel Ramsay shared that the medical team at [facility] is more familiar with this type of encephalitis and may have additional treatments which we do not have at [facility].  Martinique I spoke in detail after Dr. Leonel Ramsay left regarding the conversation that was held.  I help to clarify a variety of points which were made.  Martinique was very thankful  she shares that her primary language is Arabic so she often feels she comes across on unknowledgeable.  We spoke for a long time regarding Karielle's clinical trajectory to date and moving beyond this.  Martinique has great hope for improvement and is not thinking negatively by any measure at this point.  She refuses to hear anything to the degree of her daughter not improving.  She requests additional support through groups of parents who have had children in similar positions.  We reviewed that I do not know anything off the top of my head but I will do some research and provide her some resources tomorrow.  Discussed the importance of continued conversation with family and their  medical providers regarding overall plan of care and treatment options, ensuring decisions are within the context of the patients values and GOCs.  Decision Maker: Martinique Zerezgi (mother) [phone]  SUMMARY OF RECOMMENDATIONS   Full Code  Ongoing support for patients family, have agreed to meet daily to support  Code Status/Advance Care Planning: FULL CODE   Palliative Prophylaxis:  Oral are, Turn Q2H, Delirium Precautions  Additional Recommendations (Limitations, Scope, Preferences): Continue current scope of care   Psycho-social/Spiritual:  Desire for further Chaplaincy support: Yes Additional Recommendations: Education on critical illness   Prognosis: Not known  Discharge Planning: Unclear presently  Vitals:   03/13/21 0500 03/13/21 0600  BP: 105/62 120/70  Pulse: 90 87  Resp: 18 18  Temp: 99 F (37.2 C) 97.7 F (36.5 C)  SpO2: 99% 100%    Intake/Output Summary (Last 24 hours) at 03/13/2021 8242 Last data filed at 03/13/2021 0600 Gross per 24 hour  Intake 1689.3 ml  Output 1385 ml  Net 304.3 ml   Last Weight  Most recent update: 03/10/2021  4:13 AM    Weight  54.4 kg (119 lb 14.9 oz)            Gen:  Young critical ill F HEENT: Dry mucous membranes w/ coretrack in place,  tracheostomy in place CV: Regular rate and rhythm  PULM: On ventilator ABD: soft/nontender EXT: No edema Neuro: Responds to deep stimuli only  PPS: 10%   This conversation/these recommendations were discussed with patient primary care team, Dr. Ander Slade  Time In: 0800 Time Out: 1104 Total Time: 184 minutes Greater than 50%  of this time was spent counseling and coordinating care related to the above assessment and plan.  [place] Team Team Cell Phone: [phone] Please utilize secure chat with additional questions, if there is no response within 30 minutes please call the above phone number  Palliative Medicine Team providers are available by phone from 7am to 7pm daily and can be reached through the team cell phone.  Should this patient require assistance outside of these hours, please call the patient's attending physician.

## 2021-03-13 NOTE — Progress Notes (Addendum)
Subjective: Continues to have dyskinetic movements  Exam: Vitals:   03/13/21 1840 03/13/21 1900  BP: (!) 93/54 (!) 92/57  Pulse: 98 90  Resp: 18 18  Temp: 100.2 F (37.9 C) 99.5 F (37.5 C)  SpO2: 100% 100%   Gen: In bed, intubated Resp: non-labored breathing, no acute distress Abd: soft, nt  Neuro: MS: Does not open eyes or follow commands CN: Right pupil slightly larger than left, both are reactive.   Motor: She withdraws versus flexion in all four extremities today. 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  She continues to have dyskinetic movements which have been captured repeatedly on EEG without electrographic correlate to suggest seizure.  She is on tramadol, which does lower seizure threshold some and I am concerned about this, though reportedly her dyskinetic movements worsened after it was decreased previously.  Therefore I would not stop abruptly, but will wean down.  Recommendations: 1) wean tramadol, will schedule 25 mg twice daily through tomorrow. 2) Continue Klonopin 0.5 mg at 0700, 1200, 1700 and continue Klonopin 1 mg nightly, Depakote 500 mg every 8 hours and Keppra 1500 mg twice daily 3) Also on gabapentin 300 mg 3 times daily, clonidine 0.1 mg twice daily 4) she has apparently been accepted by the neuro ICU at [facility], awaiting bed  This patient is critically ill and at significant risk of neurological worsening, death and care requires constant monitoring of vital signs, hemodynamics,respiratory and cardiac monitoring, neurological assessment, discussion with family, other specialists and medical decision making of high complexity. I spent 38 minutes of neurocritical care time  in the care of  this patient. This was time spent independent of any time provided by nurse practitioner or PA.  Roland Rack,  MD [facility] [phone]  If 7pm- 7am, please page neurology on call as listed in [place]. 03/13/2021  7:49 PM

## 2021-03-13 NOTE — Progress Notes (Signed)
   03/13/21 1145  Clinical Encounter Type  Visited With Patient and family together;Health care provider  Visit Type Initial;Spiritual support;Critical Care   Met with patient's mother, and introduced spiritual care services to her. Chaplain offered prayer and support. Prayer seems to be an important source of comfort for the mother. Chaplain will seek to follow-up. Spiritual care services available as needed.   Jeri Lager, Chaplain

## 2021-03-13 NOTE — Progress Notes (Signed)
NAME:  Jamie [name], MRN:  [id number], DOB:  12/21/2000, LOS: 56 ADMISSION DATE:  02/06/2021, CONSULTATION DATE:  10/28 REFERRING MD:  Raymondo Band, CHIEF COMPLAINT:  Seizure, acute metabolic encephalopathy   History of Present Illness:  20 y/o female admitted with seizure and change in mental status.  She was treated with steroids and IVIg but despite this her condition worsened.  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  After treatment her mental status worsened and she developed inability to protect her airway in the setting of tachypnea and tachycardia.  She required intubation on 10/28, trach 11/8.   Pertinent  Medical History  Acquired autoimmune hypothyroidism  Significant Hospital Events: Including procedures, antibiotic start and stop dates in addition to other pertinent events   10/16 Admit 10/25 New onset seizure activity, PCCM consult 1026 pulmonary critical care signed off 02/19/2021 pulmonary critical care reconsulted for suspected respiratory distress.  intubated, continuous EEG started 10/30 starting plasmapheresis 10/31 MRI normal, EEG with GPD+ 11/1 RN reports pt seized while in MRI , additional agents added overnight.   11/5 completed 4th session of PLEX 11/7 due for 5/5 PLEX. Afternoon R blown pupil. No etiology on imaging.  11/8 trach 11/12 Rhythmic movements concerned for seizures, fever 102. Neuro evaluated for dyskinesia 11/13 Overnight continued to have dyskinetic movements associated with tachycardia. EEG with no seizure. 11/16 LP with 41 WBC 96 lymphocytes   Micro/labs: 10/16 influenza AB PCR negative, SARS-CoV-2 PCR negative 10/16 HIV antibody negative 10/16 urine culture multiple species suggest recollection 10/16 urine drug screen negative, alcohol less than 10 10/17 CSF lymphocytic pleocytosis, fungal culture negative, CSF culture negative 10/17 CSF HSV 1 DNA negative, HSV-2 DNA negative, VZV PCR CSF negative 10/17 CSF cryptococcal  antigen negative, cryptococcal antigen titer not indicated 10/17 CSF EBV positive  10/17 CMV qualitative CSF negative 10/17 NMDA IgG on CSF > 1:2560 10/18 HIV RNA quant less than 20 10/20 NMDA IgG serum: positive 1:1000 10/22 RPR nonreactive 10/22 QuantiFERON gold intermediate 10/23 EBV DNA QuantiFERON by PCR positive, less than 35 10/25 hepatitis B surface antigen nonreactive, hepatitis B core total antibody positive, hepatitis B antibody positive, hepatitis B DNA not detected 10/25 hepatitis C antibody negative 10/26 blood culture no growth to date 11/2 Tracheal aspirate abundant MSSA   Imaging: 10/16 CT head negative 10/16 MRI brain incomplete study due to patient intolerance without evidence of acute intracranial abnormality 10/22 chest abdomen pelvis CT scan no acute intrathoracic abdominal or pelvic pathology, no evidence of malignancy 10/22 MRI brain normal MRI of the brain 10/26 pelvic ultrasound right ovary not visualized, normal appearance of left ovary and uterus, trace free fluid in pelvis 10/27 pelvic MRI: Normal pelvic MRI, symmetric normal ovaries, no adnexal masses 10/31 MRI > no acute infarct or hemorrhage. Normal MRI.  11/7 CT H no acute abnormality 11/7 MRI brain no acute process  11/13 EEG suggestive of moderate to severe diffuse encephalopathy, likely related to NMDA encephalitis. No seizures or epileptiform discharges  11/14 Doppler ultrasound lower extremities negative for DVT  Procedures: Trach 11/8 >>  R IJ HD catheter 10/29 >>  RUE PICC 11/1 >> Trach 11/8 LP 11/16 > WBC 41 Lymphs 96  Interim History / Subjective:  Sympathetic storming overnight  Mother concerned about multiple medications indicated for management Discussed with neurology today -Patient apparently accepted at [facility] but no bed available at present  Concern about increasing tracheal aspirate  Objective   Blood pressure (!) 153/90, pulse 95, temperature 98.6 F (37  C), resp. rate 18,  height 5\' 2"  (1.575 m), weight 54.4 kg, last menstrual period 01/11/2021, SpO2 100 %.    Vent Mode: PRVC FiO2 (%):  [30 %] 30 % Set Rate:  [18 bmp] 18 bmp Vt Set:  [400 mL] 400 mL PEEP:  [5 cmH20] 5 cmH20 Plateau Pressure:  [14 cmH20-19 cmH20] 14 cmH20   Intake/Output Summary (Last 24 hours) at 03/13/2021 1054 Last data filed at 03/13/2021 1000 Gross per 24 hour  Intake 2169.94 ml  Output 1605 ml  Net 564.94 ml   Filed Weights   03/06/21 0146 03/07/21 0359 03/10/21 0400  Weight: 56.2 kg 56.1 kg 54.4 kg   Physical Exam: General: Young lady, does not appear acutely ill, not in distress HEENT: Moist oral mucosa Neuro: R pupil 72mm L pupil 44mm. Does not respond to noxious stimuli.  CV: S1-S2 appreciated PULM: Clear to auscultation bilaterally GI: soft flat ndnt + bowel sounds  Extremities: no acute joint deformity no cyanosis or clubbing  Skin: Unchanged hyperpigmentation on BLE   Assessment & Plan:   Acute respiratory failure with hypoxemia Tracheostomy -Was treated for MSSA pneumonia -With increased secretions -Send tracheal aspirate for gram stain and cultures -There is mild leukocytosis and increased white count, chest x-ray with bibasilar haziness  -Will empirically start Unasyn  Anti-NMDA receptor encephalitis Status epilepticus-improved Dyskinesia -She is s/p 5 days of IV Solu-Medrol, 5 days of IVIG, 5 days of Plex -1 dose of Rituxan given 11/10 -MRI pelvis negative for ovarian mass/teratoma -Intermittent dyskinesias with negative EEG -LP results noted -Appreciate neuro continuing to follow  -We had contacted [facility] for possible transfer -Patient is accepted but no bed available at present -3 times daily clonazepam, 3 times daily Neurontin, Ativan, morphine, clonidine for autonomic storming -Cooling blanket as needed -Planning for next dose of rituximab coming week  Fever, tachycardia -May be related to autonomic storming -With borderline increasing trend of  WBC -We will empirically start Unasyn -Obtain respiratory cultures  Pericarditis Pericardial effusion -Continue colchicine -Will need follow-up echo  Anemia critical illness -Continue to monitor -Transfuse per protocol  Hashimoto thyroiditis -Synthroid  Inadequate p.o. intake -Started on tube feeds, will place on hold at present -Concern with tracheal aspirate -Empirically cover for possible aspiration  Goals of Care -There seems to be a lot of significant anxiety surrounding the dyskinesias and sympathetic hyperactivity. Plan for these has been explained in detail by multiple providers, daily, without improvement in family anxiety/understanding about management. It is not realistic from a time standpoint for CCM to have multiple long discussions each day about this topic as family is requesting (1-3 discussions each day, >30 min each)-- Given how extensively and thoroughly this has been explained thus far, I do wonder if emotional support might be what the family is seeking in these discussions.  P -I will consult palliative care to see if they might be able to add a layer of emotional support  as this complex, prolonged critical illness course is navigated  -full scope of medical intervention   Appreciate palliative care involvement  Rectal tube has been in place -We will discontinue it today and assess -May need to be replaced  Best Practice (right click and "Reselect all SmartList Selections" daily)  Diet/type: tubefeeds DVT prophylaxis: LMWH GI prophylaxis: PPI Lines: n/a Foley:  Yes, and it is still needed  Code Status:  full code Last date of multidisciplinary goals of care discussion: Mother updated extensively, daily.  Discussed today 11/19  The patient is  critically ill with multiple organ systems failure and requires high complexity decision making for assessment and support, frequent evaluation and titration of therapies, application of advanced monitoring  technologies and extensive interpretation of multiple databases. Critical Care Time devoted to patient care services described in this note independent of APP/resident time (if applicable)  is 35 minutes.   Sherrilyn Rist MD [place] [facility] Personal pager: See Amion If unanswered, please page CCM On-call: [phone]

## 2021-03-13 NOTE — Progress Notes (Signed)
[facility] ADULT ICU REPLACEMENT PROTOCOL   The patient does apply for the [facility] Adult ICU Electrolyte Replacment Protocol based on the criteria listed below:   1.Exclusion criteria: TCTS patients, ECMO patients, and Dialysis patients 2. Is GFR >/= 30 ml/min? Yes.    Patient's GFR today is >60 3. Is SCr </= 2? Yes.   Patient's SCr is 0.44 mg/dL 4. Did SCr increase >/= 0.5 in 24 hours? No. 5.Pt's weight >40kg  Yes.   6. Abnormal electrolyte(s): K+ 3.3  7. Electrolytes replaced per protocol 8.  Call MD STAT for K+ </= 2.5, Phos </= 1, or Mag </= 1 Physician:  n/a  Jamie [name] 03/13/2021 6:43 AM

## 2021-03-14 DIAGNOSIS — Z7189 Other specified counseling: Secondary | ICD-10-CM | POA: Diagnosis not present

## 2021-03-14 DIAGNOSIS — R4589 Other symptoms and signs involving emotional state: Secondary | ICD-10-CM | POA: Diagnosis not present

## 2021-03-14 DIAGNOSIS — Z515 Encounter for palliative care: Secondary | ICD-10-CM | POA: Diagnosis not present

## 2021-03-14 DIAGNOSIS — G0490 Encephalitis and encephalomyelitis, unspecified: Secondary | ICD-10-CM | POA: Diagnosis not present

## 2021-03-14 LAB — GLUCOSE, CAPILLARY
Glucose-Capillary: 109 mg/dL — ABNORMAL HIGH (ref 70–99)
Glucose-Capillary: 115 mg/dL — ABNORMAL HIGH (ref 70–99)
Glucose-Capillary: 130 mg/dL — ABNORMAL HIGH (ref 70–99)
Glucose-Capillary: 134 mg/dL — ABNORMAL HIGH (ref 70–99)
Glucose-Capillary: 78 mg/dL (ref 70–99)
Glucose-Capillary: 87 mg/dL (ref 70–99)
Glucose-Capillary: 93 mg/dL (ref 70–99)

## 2021-03-14 LAB — BASIC METABOLIC PANEL
Anion gap: 9 (ref 5–15)
BUN: 11 mg/dL (ref 6–20)
CO2: 28 mmol/L (ref 22–32)
Calcium: 8.8 mg/dL — ABNORMAL LOW (ref 8.9–10.3)
Chloride: 105 mmol/L (ref 98–111)
Creatinine, Ser: 0.42 mg/dL — ABNORMAL LOW (ref 0.44–1.00)
GFR, Estimated: >60 mL/min (ref >60)
Glucose, Bld: 131 mg/dL — ABNORMAL HIGH (ref 70–99)
Potassium: 3.9 mmol/L (ref 3.5–5.1)
Sodium: 142 mmol/L (ref 135–145)

## 2021-03-14 MED ORDER — TRAMADOL HCL 50 MG PO TABS
50.0000 mg | ORAL_TABLET | Freq: Once | ORAL | Status: AC
  Administered 2021-03-14: 50 mg via ORAL
  Filled 2021-03-14: qty 1

## 2021-03-14 NOTE — Progress Notes (Signed)
Subjective: Continues to have dyskinetic movements again today.   Exam: Vitals:   03/14/21 1700 03/14/21 1800  BP: 98/64 107/67  Pulse: 92 90  Resp: 18 18  Temp:    SpO2: 100% 99%   Gen: In bed, intubated Resp: non-labored breathing, no acute distress Abd: soft, nt  Neuro: MS: Does not open eyes or follow commands CN: Right pupil slightly larger than left, both are reactive.   Motor: She withdraws versus flexion in all four extremities today. 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  She continues to have dyskinetic movements which have been captured repeatedly on EEG without electrographic correlate to suggest seizure.  She is on tramadol, which does lower seizure threshold some and I am concerned about this, though reportedly her dyskinetic movements worsened after it was decreased previously.   Recommendations: 1) wean tramadol, gave additional dose today with no clear benefit.  2) Continue Klonopin 0.5 mg at 0700, 1200, 1700 and continue Klonopin 1 mg nightly, Depakote 500 mg every 8 hours and Keppra 1500 mg twice daily 3) Also on gabapentin 300 mg 3 times daily, clonidine 0.1 mg twice daily 4) she has apparently been accepted by the neuro ICU at [facility], awaiting bed  This patient is critically ill and at significant risk of neurological worsening, death and care requires constant monitoring of vital signs, hemodynamics,respiratory and cardiac monitoring, neurological assessment, discussion with family, other specialists and medical decision making of high complexity. I spent 34 minutes of neurocritical care time  in the care of  this patient. This was time spent independent of any time provided by nurse practitioner or PA.  Roland Rack, MD [facility] [phone]  If 7pm- 7am, please page neurology on call as  listed in [place]. 03/14/2021  6:37 PM

## 2021-03-14 NOTE — Progress Notes (Addendum)
Seen at bedside at mother's request at approximately 9:00 PM. Initially was sleeping calmly, trach in place, mild tachycardia, decreased tone x 4. Discussed medication regimen with mother.   MD left room for approximately 10 minutes to respond to a call. Patient's scheduled Ativan dose administered by RN. On MD returning to patient's room, was more tachycardic and was exhibiting dyskinetic movements. 4 mg morphine IV administered.   Addendum -Patient with recurrent sympathetic storming: Paged by RN for increased BP and dyskinetic movements, despite 100 mcg fentanyl bolus, 2 mg IV Ativan, and 4 mg IV morphine. BP still elevated and still with thrashing movements. Afebrile. -Additional 4 mg IV morphine and 2 mg Ativan administered, with some effect on movements, legs no longer moving, LUE less movement, RUE thrashing movements unchanged.  -Metoprolol 2.5 mg IV ordered. Additional 4 mg morphine ordered.  -Scheduled metoprolol dose changed from pill to liquid and increased by 50% to 37.5 mg BID  Electronically signed: Dr. Kerney Elbe

## 2021-03-14 NOTE — Progress Notes (Signed)
NAME:  Jamie [name], MRN:  [id number], DOB:  Sep 17, 2000, LOS: 57 ADMISSION DATE:  02/06/2021, CONSULTATION DATE:  10/28 REFERRING MD:  Raymondo Band, CHIEF COMPLAINT:  Seizure, acute metabolic encephalopathy   History of Present Illness:  20 y/o female admitted with seizure and change in mental status.  She was treated with steroids and IVIg but despite this her condition worsened.  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  After treatment her mental status worsened and she developed inability to protect her airway in the setting of tachypnea and tachycardia.  She required intubation on 10/28, trach 11/8.   Pertinent  Medical History  Acquired autoimmune hypothyroidism  Significant Hospital Events: Including procedures, antibiotic start and stop dates in addition to other pertinent events   10/16 Admit 10/25 New onset seizure activity, PCCM consult 1026 pulmonary critical care signed off 02/19/2021 pulmonary critical care reconsulted for suspected respiratory distress.  intubated, continuous EEG started 10/30 starting plasmapheresis 10/31 MRI normal, EEG with GPD+ 11/1 RN reports pt seized while in MRI , additional agents added overnight.   11/5 completed 4th session of PLEX 11/7 due for 5/5 PLEX. Afternoon R blown pupil. No etiology on imaging.  11/8 trach 11/12 Rhythmic movements concerned for seizures, fever 102. Neuro evaluated for dyskinesia 11/13 Overnight continued to have dyskinetic movements associated with tachycardia. EEG with no seizure. 11/16 LP with 41 WBC 96 lymphocytes   Micro/labs: 10/16 influenza AB PCR negative, SARS-CoV-2 PCR negative 10/16 HIV antibody negative 10/16 urine culture multiple species suggest recollection 10/16 urine drug screen negative, alcohol less than 10 10/17 CSF lymphocytic pleocytosis, fungal culture negative, CSF culture negative 10/17 CSF HSV 1 DNA negative, HSV-2 DNA negative, VZV PCR CSF negative 10/17 CSF cryptococcal  antigen negative, cryptococcal antigen titer not indicated 10/17 CSF EBV positive  10/17 CMV qualitative CSF negative 10/17 NMDA IgG on CSF > 1:2560 10/18 HIV RNA quant less than 20 10/20 NMDA IgG serum: positive 1:1000 10/22 RPR nonreactive 10/22 QuantiFERON gold intermediate 10/23 EBV DNA QuantiFERON by PCR positive, less than 35 10/25 hepatitis B surface antigen nonreactive, hepatitis B core total antibody positive, hepatitis B antibody positive, hepatitis B DNA not detected 10/25 hepatitis C antibody negative 10/26 blood culture no growth to date 11/2 Tracheal aspirate abundant MSSA  11/19 Few gram positive cocci  Imaging: 10/16 CT head negative 10/16 MRI brain incomplete study due to patient intolerance without evidence of acute intracranial abnormality 10/22 chest abdomen pelvis CT scan no acute intrathoracic abdominal or pelvic pathology, no evidence of malignancy 10/22 MRI brain normal MRI of the brain 10/26 pelvic ultrasound right ovary not visualized, normal appearance of left ovary and uterus, trace free fluid in pelvis 10/27 pelvic MRI: Normal pelvic MRI, symmetric normal ovaries, no adnexal masses 10/31 MRI > no acute infarct or hemorrhage. Normal MRI.  11/7 CT H no acute abnormality 11/7 MRI brain no acute process  11/13 EEG suggestive of moderate to severe diffuse encephalopathy, likely related to NMDA encephalitis. No seizures or epileptiform discharges  11/14 Doppler ultrasound lower extremities negative for DVT  Procedures: Trach 11/8 >>  R IJ HD catheter 10/29 >>  RUE PICC 11/1 >> Trach 11/8 LP 11/16 > WBC 41 Lymphs 96  Interim History / Subjective:  Sympathetic storming, still with Dyskinetic movements Mother concerned about multiple medications indicated for management -questions regarding the side effects of Morphine and Ativan causing the abnormal movements - I tried to reassure her that it is likely the underlying process causing the  movement and not the  medications  Discussed with neurology today -Patient apparently accepted at [facility] but no bed available at present -I personally called transfer line 11/20- they will call us when a bed becomes available   Objective   Blood pressure 127/75, pulse (!) 104, temperature 98.8 F (37.1 C), temperature source Bladder, resp. rate (!) 21, height 5\' 2"  (1.575 m), weight 54.4 kg, last menstrual period 01/11/2021, SpO2 100 %.    Vent Mode: PRVC FiO2 (%):  [30 %] 30 % Set Rate:  [18 bmp] 18 bmp Vt Set:  [400 mL] 400 mL PEEP:  [5 cmH20] 5 cmH20 Plateau Pressure:  [17 cmH20-18 cmH20] 18 cmH20   Intake/Output Summary (Last 24 hours) at 03/14/2021 0945 Last data filed at 03/14/2021 0800 Gross per 24 hour  Intake 1903.68 ml  Output 1790 ml  Net 113.68 ml   Filed Weights   03/06/21 0146 03/07/21 0359 03/10/21 0400  Weight: 56.2 kg 56.1 kg 54.4 kg   Physical Exam: General: Young lady, appears acutely ill, dystonic movements HEENT: Moist oral mucosa Neuro: R pupil 53mm L pupil 70mm. Does not respond to noxious stimuli.  CV: S1-S2 appreciated PULM: Clear breath sounds GI: Soft, bowel sounds appreciated Extremities: no acute joint deformity no cyanosis or clubbing  Skin: Unchanged hyperpigmentation on BLE   Assessment & Plan:   Acute hypoxemic respiratory failure Status post tracheostomy -Was treated for MSSA pneumonia -Increased secretions noted 11/19, secretions sent for analysis, empirically started on Unasyn -Bibasilar haziness on chest x-ray 11/19, mild leukocytosis -Day 2 of Unasyn  Anti-NMDA receptor encephalitis Status epilepticus improved Dyskinesia -S/p 5 days of IV Solu-Medrol, 5 days of IVIG, 5 days of Plex -1 day of Rituxan given 11/10 with a dose schedule for the coming week -MRI pelvis negative for ovarian mass/teratoma -LP results noted -Appreciate neuro assistance with care  [facility] was contacted for possible transfer -Patient was accepted pending bed  availability -Called today, they will call us when a bed becomes available  On clonazepam, Neurontin, Ativan, morphine, clonidine for autonomic storming Cooling blanket as needed For rituximab coming week  Fever, tachycardia, dyskinetic movements -Likely related to autonomic storming  Pericarditis Pericardial effusion -On colchicine -Will need follow-up echo  Anemia critical illness -Continue to monitor -Transfuse per protocol  Hashimoto's thyroiditis -Synthroid  Tube feeds restarted   Goals of Care -There seems to be a lot of significant anxiety surrounding the dyskinesias and sympathetic hyperactivity. Plan for these has been explained in detail by multiple providers, daily, without improvement in family anxiety/understanding about management. It is not realistic from a time standpoint for CCM to have multiple long discussions each day about this topic as family is requesting (1-3 discussions each day, >30 min each)-- Given how extensively and thoroughly this has been explained thus far, I do wonder if emotional support might be what the family is seeking in these discussions.  P -Appreciate palliative care to add a layer of emotional support  as this complex, prolonged critical illness course is navigated  -full scope of medical intervention   Rectal tube has been in place -May need to be replaced  Best Practice (right click and "Reselect all SmartList Selections" daily)  Diet/type: tubefeeds DVT prophylaxis: LMWH GI prophylaxis: PPI Lines: n/a Foley:  Yes, and it is still needed  Code Status:  full code Last date of multidisciplinary goals of care discussion: Mother updated extensively, daily.  Discussed today 11/19  The patient is critically ill with multiple organ systems  failure and requires high complexity decision making for assessment and support, frequent evaluation and titration of therapies, application of advanced monitoring technologies and extensive  interpretation of multiple databases. Critical Care Time devoted to patient care services described in this note independent of APP/resident time (if applicable)  is 31 minutes.   Sherrilyn Rist MD [place] [facility] Personal pager: See Amion If unanswered, please page CCM On-call: [phone]

## 2021-03-14 NOTE — Progress Notes (Signed)
   03/14/21 1336  Clinical Encounter Type  Visited With Patient and family together  Visit Type Initial;Spiritual support  Referral From Nurse  Consult/Referral To Chaplain  Spiritual Encounters  Spiritual Needs Prayer;Sacred text   Chaplain responded to page for prayer. Pt's mom, [place], was in the room. Pt's mom shared about her daughter and expressed her strong faith in God. Pt's mom requested a chaplain visit daily to prayer for Pt, however brief. Chaplain said she would pass on this request. Chaplain retrieved a Bible for Pt's mom, as her phone hurts her eyes. Pt's mom was appreciative of Pt's interdisciplinary team and previous chaplain visits. Chaplain prayed for Pt at bedside before leaving, before Pt mom's request. Chaplain remains available.  This note was prepared by Marijo File, MDiv. Chaplain remains available as needed through the on-call pager: [phone].

## 2021-03-14 NOTE — Progress Notes (Addendum)
Palliative Medicine Inpatient Follow Up Note  Consulting Provider: Cristal Generous, NP   Reason for consult:   [name] Consult  Reason for Consult? complex critical illness    HPI:  Per intake H&P --> 20 y.o. female with PMHx of Hashimoto thyroiditis  admitted with seizure and change in mental status.  She was treated with steroids and IVIg but despite this her condition worsened.  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  After treatment her mental status worsened and she developed inability to protect her airway in the setting of tachypnea and tachycardia.  She required intubation on 10/28, trach 11/8.    Palliative care has been asked to get involved to offer additional family support. The goals are and have been clear for full scope of treatment.   Today's Discussion (03/14/2021):  *Please note that this is a verbal dictation therefore any spelling or grammatical errors are due to the "[place] [facility]" system interpretation.  Chart reviewed.  I met with Jamie [name] at bedside this morning.  She asked me if she could take a couple of minutes to pray which I was respectful of.  We then commenced with conversation. Jamie [name] shares that she is very thankful for the palliative care team's involvement.  We reviewed her questions from the day prior and those which exist today.  I tried to clarify as much as I could based upon chart review, lab results, and progress notes.  We spent a great deal of time discussing my chart and the various ways to access my chart.  We reviewed the functions on the website and application.  While discussing these topics neurology entered.  Dr. Leonel Ramsay was able to update Jamie [name]'s mother.  He was able to share that he will add back some tramadol today to see if that helps with her dyskinesias.  I spent time listening to the concerns that Jamie [name] has.  She expresses thankfulness that she has someone  she can confide in.  Open and honest communication was held about where we were out with Jamie [name]  potentially what to expect moving forward.  Jamie [name] expresses a great deal of hope for the future and states that Jamie [name] was not able to withdraw to pain prior which she is now able to do.  Jamie [name] feels this is a great deal of improvement.  We reviewed that right now it is difficult as we are watchfully waiting to see if improvements occur.  A safe space was created to allow Jamie [name] to express her fears about the future.  She is presently holding onto a great deal of hope.  Emotional support was provided through therapeutic listening.  Questions and concerns addressed   Objective Assessment: Vital Signs Vitals:   03/14/21 1250 03/14/21 1300  BP: (!) 143/130 (!) 145/101  Pulse:  (!) 138  Resp: (!) 28 (!) 24  Temp:    SpO2:  100%    Intake/Output Summary (Last 24 hours) at 03/14/2021 1329 Last data filed at 03/14/2021 1309 Gross per 24 hour  Intake 1285.39 ml  Output 1800 ml  Net -514.61 ml   Last Weight  Most recent update: 03/10/2021  4:13 AM    Weight  54.4 kg (119 lb 14.9 oz)            Gen:  Young critical ill F HEENT: Dry mucous membranes w/ coretrack in place, tracheostomy in place CV: Regular rate and rhythm  PULM: On ventilator ABD: soft/nontender EXT: No  edema Neuro: Withdraws to pain  SUMMARY OF RECOMMENDATIONS   Full Code/Full Scope of Care  Goals are for recovery  Spiritual Visits daily   Ongoing support for patient and her mother   Time Spent: 118 minutes Greater than 50% of the time was spent in counseling and coordination of care ______________________________________________________________________________________ [place] Team Team Cell Phone: [phone] Please utilize secure chat with additional questions, if there is no response within 30 minutes please call the above phone number  Palliative Medicine Team  providers are available by phone from 7am to 7pm daily and can be reached through the team cell phone.  Should this patient require assistance outside of these hours, please call the patient's attending physician.

## 2021-03-15 ENCOUNTER — Inpatient Hospital Stay (HOSPITAL_COMMUNITY): Payer: Medicaid Other

## 2021-03-15 ENCOUNTER — Encounter: Payer: Self-pay | Admitting: Neurology

## 2021-03-15 ENCOUNTER — Encounter (HOSPITAL_COMMUNITY): Payer: Self-pay | Admitting: Internal Medicine

## 2021-03-15 DIAGNOSIS — J15211 Pneumonia due to Methicillin susceptible Staphylococcus aureus: Secondary | ICD-10-CM | POA: Diagnosis not present

## 2021-03-15 DIAGNOSIS — G0490 Encephalitis and encephalomyelitis, unspecified: Secondary | ICD-10-CM | POA: Diagnosis not present

## 2021-03-15 DIAGNOSIS — J9601 Acute respiratory failure with hypoxia: Secondary | ICD-10-CM | POA: Diagnosis not present

## 2021-03-15 DIAGNOSIS — Z9911 Dependence on respirator [ventilator] status: Secondary | ICD-10-CM

## 2021-03-15 LAB — CULTURE, RESPIRATORY W GRAM STAIN: Gram Stain: NONE SEEN

## 2021-03-15 LAB — BASIC METABOLIC PANEL
Anion gap: 8 (ref 5–15)
BUN: 13 mg/dL (ref 6–20)
CO2: 27 mmol/L (ref 22–32)
Calcium: 8.6 mg/dL — ABNORMAL LOW (ref 8.9–10.3)
Chloride: 106 mmol/L (ref 98–111)
Creatinine, Ser: 0.34 mg/dL — ABNORMAL LOW (ref 0.44–1.00)
GFR, Estimated: >60 mL/min (ref >60)
Glucose, Bld: 134 mg/dL — ABNORMAL HIGH (ref 70–99)
Potassium: 3.4 mmol/L — ABNORMAL LOW (ref 3.5–5.1)
Sodium: 141 mmol/L (ref 135–145)

## 2021-03-15 LAB — GLUCOSE, CAPILLARY
Glucose-Capillary: 126 mg/dL — ABNORMAL HIGH (ref 70–99)
Glucose-Capillary: 109 mg/dL — ABNORMAL HIGH (ref 70–99)
Glucose-Capillary: 121 mg/dL — ABNORMAL HIGH (ref 70–99)
Glucose-Capillary: 101 mg/dL — ABNORMAL HIGH (ref 70–99)
Glucose-Capillary: 111 mg/dL — ABNORMAL HIGH (ref 70–99)

## 2021-03-15 LAB — CBC WITH DIFFERENTIAL/PLATELET
Abs Immature Granulocytes: 0 10*3/uL (ref 0.00–0.07)
Basophils Absolute: 0.1 10*3/uL (ref 0.0–0.1)
Basophils Relative: 1 %
Eosinophils Absolute: 0.2 10*3/uL (ref 0.0–0.5)
Eosinophils Relative: 2 %
HCT: 28.4 % — ABNORMAL LOW (ref 36.0–46.0)
Hemoglobin: 8.3 g/dL — ABNORMAL LOW (ref 12.0–15.0)
Lymphocytes Relative: 11 %
Lymphs Abs: 1.1 10*3/uL (ref 0.7–4.0)
MCH: 27.6 pg (ref 26.0–34.0)
MCHC: 29.2 g/dL — ABNORMAL LOW (ref 30.0–36.0)
MCV: 94.4 fL (ref 80.0–100.0)
Monocytes Absolute: 0.8 10*3/uL (ref 0.1–1.0)
Monocytes Relative: 8 %
Neutro Abs: 7.6 10*3/uL (ref 1.7–7.7)
Neutrophils Relative %: 78 %
Platelets: 496 10*3/uL — ABNORMAL HIGH (ref 150–400)
RBC: 3.01 MIL/uL — ABNORMAL LOW (ref 3.87–5.11)
RDW: 18 % — ABNORMAL HIGH (ref 11.5–15.5)
WBC: 9.7 10*3/uL (ref 4.0–10.5)
nRBC: 0 /100 WBC
nRBC: 0.3 % — ABNORMAL HIGH (ref 0.0–0.2)

## 2021-03-15 LAB — TSH: TSH: 6.925 u[IU]/mL — ABNORMAL HIGH (ref 0.350–4.500)

## 2021-03-15 MED ORDER — MORPHINE SULFATE (PF) 4 MG/ML IV SOLN
4.0000 mg | Freq: Once | INTRAVENOUS | Status: AC
  Administered 2021-03-15: 4 mg via INTRAVENOUS
  Filled 2021-03-15: qty 1

## 2021-03-15 MED ORDER — PROPRANOLOL HCL 20 MG/5ML PO SOLN
20.0000 mg | Freq: Three times a day (TID) | ORAL | Status: DC
  Administered 2021-03-15 (×2): 20 mg
  Filled 2021-03-15 (×3): qty 5

## 2021-03-15 MED ORDER — NOREPINEPHRINE 4 MG/250ML-% IV SOLN
2.0000 ug/min | INTRAVENOUS | Status: DC
  Administered 2021-03-15: 2 ug/min via INTRAVENOUS
  Administered 2021-03-16: 5 ug/min via INTRAVENOUS
  Administered 2021-03-16: 6 ug/min via INTRAVENOUS
  Filled 2021-03-15 (×2): qty 250

## 2021-03-15 MED ORDER — METOPROLOL TARTRATE 25 MG/10 ML ORAL SUSPENSION
37.5000 mg | Freq: Two times a day (BID) | ORAL | Status: DC
  Administered 2021-03-15: 37.5 mg
  Filled 2021-03-15: qty 20

## 2021-03-15 MED ORDER — SODIUM CHLORIDE 0.9 % IV SOLN
1000.0000 mg | Freq: Every day | INTRAVENOUS | Status: DC
  Filled 2021-03-15: qty 16

## 2021-03-15 MED ORDER — POTASSIUM CHLORIDE 20 MEQ PO PACK
40.0000 meq | PACK | Freq: Once | ORAL | Status: AC
  Administered 2021-03-15: 40 meq
  Filled 2021-03-15: qty 2

## 2021-03-15 MED ORDER — SODIUM CHLORIDE 0.9 % IV SOLN
250.0000 mL | INTRAVENOUS | Status: DC
  Administered 2021-03-15: 250 mL via INTRAVENOUS

## 2021-03-15 MED ORDER — LORAZEPAM 2 MG/ML IJ SOLN
2.0000 mg | Freq: Once | INTRAMUSCULAR | Status: AC
  Administered 2021-03-15: 2 mg via INTRAVENOUS

## 2021-03-15 MED ORDER — BETHANECHOL CHLORIDE 10 MG PO TABS
10.0000 mg | ORAL_TABLET | Freq: Three times a day (TID) | ORAL | Status: DC
  Administered 2021-03-15 – 2021-03-16 (×5): 10 mg via ORAL
  Filled 2021-03-15 (×6): qty 1

## 2021-03-15 MED ORDER — LORAZEPAM 2 MG/ML IJ SOLN
2.0000 mg | INTRAMUSCULAR | Status: AC
  Administered 2021-03-15: 2 mg via INTRAVENOUS

## 2021-03-15 MED ORDER — DANTROLENE SODIUM 25 MG PO CAPS
25.0000 mg | ORAL_CAPSULE | Freq: Two times a day (BID) | ORAL | Status: DC
  Administered 2021-03-15 – 2021-03-16 (×3): 25 mg via ORAL
  Filled 2021-03-15 (×4): qty 1

## 2021-03-15 MED ORDER — METOPROLOL TARTRATE 25 MG/10 ML ORAL SUSPENSION: 50.0000 mg | Freq: Two times a day (BID) | ORAL | Status: DC

## 2021-03-15 MED ORDER — SODIUM CHLORIDE 0.9 % IV SOLN
2.0000 g | INTRAVENOUS | Status: DC
  Administered 2021-03-15 – 2021-03-16 (×9): 2 g via INTRAVENOUS
  Filled 2021-03-15 (×13): qty 2000

## 2021-03-15 MED ORDER — PROPOFOL 1000 MG/100ML IV EMUL
INTRAVENOUS | Status: AC
  Filled 2021-03-15: qty 100

## 2021-03-15 MED ORDER — METOPROLOL TARTRATE 5 MG/5ML IV SOLN: 2.5000 mg | Freq: Once | INTRAVENOUS | Status: DC

## 2021-03-15 MED ORDER — PROPOFOL 1000 MG/100ML IV EMUL
5.0000 ug/kg/min | INTRAVENOUS | Status: DC
  Administered 2021-03-15 – 2021-03-16 (×2): 10 ug/kg/min via INTRAVENOUS
  Filled 2021-03-15: qty 100

## 2021-03-15 MED ORDER — NOREPINEPHRINE 4 MG/250ML-% IV SOLN
INTRAVENOUS | Status: AC
  Filled 2021-03-15: qty 250

## 2021-03-15 MED ORDER — CLONAZEPAM 0.25 MG PO TBDP
1.0000 mg | ORAL_TABLET | Freq: Three times a day (TID) | ORAL | Status: DC
  Filled 2021-03-15: qty 4

## 2021-03-15 MED ORDER — METOPROLOL TARTRATE 5 MG/5ML IV SOLN
2.5000 mg | Freq: Once | INTRAVENOUS | Status: AC
  Administered 2021-03-15: 2.5 mg via INTRAVENOUS
  Filled 2021-03-15: qty 5

## 2021-03-15 MED ORDER — METOPROLOL TARTRATE 5 MG/5ML IV SOLN: 2.5000 mg | INTRAVENOUS | Status: DC | PRN

## 2021-03-15 MED ORDER — CLONAZEPAM 0.25 MG PO TBDP
1.0000 mg | ORAL_TABLET | Freq: Three times a day (TID) | ORAL | Status: DC
  Administered 2021-03-15 – 2021-03-16 (×4): 1 mg
  Filled 2021-03-15 (×3): qty 4

## 2021-03-15 MED ORDER — BARIUM SULFATE POWD
240.0000 mL | Freq: Once | Status: AC
  Administered 2021-03-15: 240 mL
  Filled 2021-03-15: qty 240

## 2021-03-15 MED ORDER — LORAZEPAM 2 MG/ML IJ SOLN
2.0000 mg | Freq: Once | INTRAMUSCULAR | Status: AC
  Administered 2021-03-15: 2 mg via INTRAVENOUS
  Filled 2021-03-15: qty 1

## 2021-03-15 NOTE — Progress Notes (Signed)
Nutrition Follow-up  DOCUMENTATION CODES:   Not applicable  INTERVENTION:   Tube feeding via Cortrak tube:  Vital 1.5 @ 55 ml/h  (1320 ml per day) Prosource TF 45 ml TID  Provides 2100 kcal, 122 gm protein, 1003 ml free water daily   NUTRITION DIAGNOSIS:   Inadequate oral intake related to acute illness (pt with AMS) as evidenced by meal completion < 25%. Ongoing.   GOAL:   Patient will meet greater than or equal to 90% of their needs Met with TF at goal   MONITOR:   Vent status, TF tolerance, Skin, Weight trends, Labs, I & O's  REASON FOR ASSESSMENT:   Consult Enteral/tube feeding initiation and management  ASSESSMENT:   20 y.o. female with a PMHx of Hashimoto thyroiditis who is admitted with acute encephalopathy and catatonia.  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  Cardiology following for acute pericarditis  Per notes, pt may transfer to [facility].   During admission pt has had very poor nutrition due to tube in and out, and ileus.   10/16 admitted 10/21 cortrak placed; tip gastric  10/25 seizures; cortrak out 10/26 cortrak placed; tip gastric 10/27 cortrak out  10/28 intubated; OG placed  10/30 PLEX 11/1 seizures; PLEX 11/3 PLEX; pt with emesis episode due to vagal response; KUB with colonic distention measuring up to 6.3 cm possible ileus; OG with tip in stomach  11/6 TF to 40 11/7 TF to goal 11/8 TF held for trach at 2 pm 11/9 cortrak placed; tip gastric   Patient is currently intubated on ventilator support MV: 7.2 L/min Temp (24hrs), Avg:98.4 F (36.9 C), Min:97 F (36.1 C), Max:99 F (37.2 C)   Medications reviewed and include: synthroid, protonix, thiamine   Labs reviewed: K 3.4 CBG's: 109-134  UOP: 1360 ml    Diet Order:   Diet Order     None       EDUCATION NEEDS:   No education needs have been identified at this time  Skin:  Skin Assessment: Reviewed RN Assessment  Last BM:  1/21 via  rectal tube  Height:   Ht Readings from Last 1 Encounters:  02/19/21 5' 2"  (1.575 m) (18 %, Z= -0.90)*   * Growth percentiles are based on CDC (Girls, 2-20 Years) data.    Weight:   Wt Readings from Last 1 Encounters:  03/10/21 54.4 kg (35 %, Z= -0.39)*   * Growth percentiles are based on CDC (Girls, 2-20 Years) data.    Ideal Body Weight:  50 kg  BMI:  Body mass index is 21.94 kg/m.  Estimated Nutritional Needs:   Kcal:  2000  Protein:  90-110 grams  Fluid:  >/= 1.5 L/day  Lockie Pares., RD, LDN, CNSC See [facility] for contact information

## 2021-03-15 NOTE — Progress Notes (Signed)
   03/15/21 1045  Clinical Encounter Type  Visited With Patient and family together  Visit Type Follow-up;Spiritual support  Referral From Chaplain  Consult/Referral To [facility] visited the patient. The patient's mother was at the bedside. Ms. Martinique requested disposable gowns for the family to wear while visiting; because they will be leaving the airport and coming directly to the hospital. Ms. Martinique said this precaution would lessen the patient's exposure to germs. Chaplain Baker Janus provided hospitality in assisting in acquiring three gowns and additional masks for Ms. Martinique and requested daily prayers if possible. She is hopeful the patient will have a complete recovery. Ms. Martinique said she only wants to hear hope, faith talk, and positive speaking concerning the patient's condition. Ms. Martinique speaks Arabic, which is her primary language. Chaplain Freddy Kinne's assessment is for providers to ask reflective questions to ensure Ms. Martinique understands what is communicated to her. Visit ended in prayer. This note was prepared by Jeanine Luz, M.Div..  For questions please contact by phone [phone].

## 2021-03-15 NOTE — Progress Notes (Signed)
Propofol started at 10, BPs became low at around 94/37 (54).  Propofol and fentanyl turned off and BPs remain the same.  Dr. Carlis Abbott notified, awaiting orders.

## 2021-03-15 NOTE — Progress Notes (Signed)
[facility] ADULT ICU REPLACEMENT PROTOCOL   The patient does apply for the [facility] Adult ICU Electrolyte Replacment Protocol based on the criteria listed below:   1.Exclusion criteria: TCTS patients, ECMO patients, and Dialysis patients 2. Is GFR >/= 30 ml/min? Yes.    Patient's GFR today is >60 3. Is SCr </= 2? Yes.   Patient's SCr is 0.34 mg/dL 4. Did SCr increase >/= 0.5 in 24 hours? No. 5.Pt's weight >40kg  Yes.   6. Abnormal electrolyte(s): K+3.4  7. Electrolytes replaced per protocol 8.  Call MD STAT for K+ </= 2.5, Phos </= 1, or Mag </= 1 Physician:   Kendell Bane 03/15/2021 5:50 AM

## 2021-03-15 NOTE — Progress Notes (Signed)
   Date: 03/15/2021  To whom it may concern,   Ms Jamie [name] has been admitted at [facility] since 02/06/2021. She is still undergoing treatment and will likely require treatment for additional 2-3 months.   This letter was written for patient's mother Jamie [name].  Please do not hesitate to contact us if you have any further questions  Dr Zeb Comfort [facility]

## 2021-03-15 NOTE — Progress Notes (Signed)
Chief Complaint: Patient was seen in consultation today for G-tube  Referring Physician(s): Eliseo Gum NP  Supervising Physician: Mir, Sharen Heck  Patient Status: [facility] - In-pt  History of Present Illness: Jamie [name] is a 20 y.o. 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 She remains sedated on vent and has had tracheostomy creation. Tolerating TF via cortrak. Primary has discussed need for long term TF needs and they are agreeable to percutaneous gastrostomy tube placement. IR is asked to perform procedure. PMHx, meds, labs, imaging, allergies reviewed. Mother at bedside.   Past Medical History:  Diagnosis Date   Acquired autoimmune hypothyroidism    Dx 12/2014, TSH 110, FT4 0.3    History reviewed. No pertinent surgical history.  Allergies: Patient has no known allergies.  Medications:  Current Facility-Administered Medications:    0.9 %  sodium chloride infusion, 250 mL, Intravenous, Continuous, Ogan, [name], MD, Stopped at 03/11/21 1454   acetaminophen (TYLENOL) 160 MG/5ML solution 650 mg, 650 mg, Per Tube, Q4H PRN, Frederik Pear, MD, 650 mg at 03/12/21 1203   albuterol (PROVENTIL) (2.5 MG/3ML) 0.083% nebulizer solution 2.5 mg, 2.5 mg, Nebulization, Once PRN, Lora Havens, MD   atropine 1 % ophthalmic solution 1 drop, 1 drop, Sublingual, TID PRN, Merrily Brittle, DO, 1 drop at 03/02/21 1717   bethanechol (URECHOLINE) tablet 10 mg, 10 mg, Oral, TID, Noemi Chapel P, DO   chlorhexidine gluconate (MEDLINE KIT) (PERIDEX) 0.12 % solution 15 mL, 15 mL, Mouth Rinse, BID, Bowser, Grace E, NP, 15 mL at 03/15/21 0816   Chlorhexidine Gluconate Cloth 2 % PADS 6 each, 6 each, Topical, Daily, Dareen Piano, Nischal, MD, 6 each at 03/15/21 0300   clonazePAM (KLONOPIN) disintegrating tablet 1 mg, 1 mg, Per Tube, QHS, Amie Portland, MD, 1 mg at 03/14/21 2144   clonazePAM (KLONOPIN) disintegrating tablet 1 mg, 1 mg, Per Tube, TID,  Julian Hy, DO, 1 mg at 03/15/21 4166   cloNIDine (CATAPRES) tablet 0.1 mg, 0.1 mg, Per Tube, BID, Kerney Elbe, MD, 0.1 mg at 03/15/21 0919   colchicine tablet 0.6 mg, 0.6 mg, Per Tube, Daily, Dixie Dials, MD, 0.6 mg at 03/15/21 0917   diphenhydrAMINE (BENADRYL) injection 50 mg, 50 mg, Intravenous, Once PRN, Lora Havens, MD   enoxaparin (LOVENOX) injection 40 mg, 40 mg, Subcutaneous, Q24H, Byrum, Rose Fillers, MD, 40 mg at 03/14/21 1119   famotidine (PEPCID) IVPB 20 mg premix, 20 mg, Intravenous, Once PRN, Lora Havens, MD   feeding supplement (PROSource TF) liquid 45 mL, 45 mL, Per Tube, TID, Freda Jackson B, MD, 45 mL at 03/15/21 0919   feeding supplement (VITAL 1.5 CAL) liquid 1,000 mL, 1,000 mL, Per Tube, Continuous, Chand, Sudham, MD, Last Rate: 55 mL/hr at 03/15/21 0615, 1,000 mL at 03/15/21 0615   fentaNYL (SUBLIMAZE) bolus via infusion 50-100 mcg, 50-100 mcg, Intravenous, Q15 min PRN, Collene Gobble, MD, 100 mcg at 03/15/21 0615   fentaNYL 2576mg in NS 2568m(1029mml) infusion-PREMIX, 50-200 mcg/hr, Intravenous, Continuous, Byrum, RobRose FillersD, Last Rate: 20 mL/hr at 03/15/21 1001, 200 mcg/hr at 03/15/21 1001   gabapentin (NEURONTIN) 250 MG/5ML solution 300 mg, 300 mg, Per Tube, Q8H, Pierce, Dwayne A, RPH, 300 mg at 03/15/21 0606   levETIRAcetam (KEPPRA) IVPB 1500 mg/ 100 mL premix, 1,500 mg, Intravenous, Q12H, KirGreta DoomD, Stopped at 03/15/21 [id number] (SYNTHROID) tablet 100 mcg, 100 mcg, Per Tube, Q0600, SmiCandee FurbishD, 100 mcg at 03/15/21 [phone]  LORazepam (ATIVAN) injection 2 mg, 2 mg, Intravenous, Q4H PRN, Kerney Elbe, MD, 2 mg at 03/15/21 9977   MEDLINE mouth rinse, 15 mL, Mouth Rinse, 10 times per day, Bowser, Laurel Dimmer, NP, 15 mL at 03/15/21 0945   metoprolol tartrate (LOPRESSOR) 25 mg/10 mL oral suspension 50 mg, 50 mg, Per Tube, BID, Noemi Chapel P, DO   metoprolol tartrate (LOPRESSOR) injection 2.5 mg, 2.5 mg, Intravenous, Q2H PRN,  Noemi Chapel P, DO   morphine 4 MG/ML injection 4 mg, 4 mg, Intravenous, Q2H PRN, Lora Havens, MD, 4 mg at 03/15/21 4142   nafcillin 2 g in sodium chloride 0.9 % 100 mL IVPB, 2 g, Intravenous, Q4H, Clark, Laura P, DO   pantoprazole sodium (PROTONIX) 40 mg/20 mL oral suspension 40 mg, 40 mg, Per Tube, QHS, Simonne Maffucci B, MD, 40 mg at 03/14/21 2144   potassium chloride (KLOR-CON) packet 40 mEq, 40 mEq, Per Tube, Once, Noemi Chapel P, DO   scopolamine (TRANSDERM-SCOP) 1 MG/3DAYS 1.5 mg, 1 patch, Transdermal, Q72H, Katsadouros, Vasilios, MD, 1.5 mg at 03/12/21 1516   sodium chloride 0.9 % bolus 1,000 mL, 1,000 mL, Intravenous, Once PRN, Lora Havens, MD   sodium chloride flush (NS) 0.9 % injection 10-40 mL, 10-40 mL, Intracatheter, Q12H, Candee Furbish, MD, 20 mL at 03/15/21 0920   sodium chloride flush (NS) 0.9 % injection 10-40 mL, 10-40 mL, Intracatheter, PRN, Candee Furbish, MD   sodium chloride flush (NS) 0.9 % injection 10-40 mL, 10-40 mL, Intracatheter, PRN, Collene Gobble, MD   tenofovir Veva Holes) tablet 300 mg, 300 mg, Per Tube, Daily, Tommy Medal, Lavell Islam, MD, 300 mg at 03/15/21 3953   thiamine tablet 100 mg, 100 mg, Per Tube, Daily, Karren Cobble, RPH, 100 mg at 03/15/21 0920   valproic acid (DEPAKENE) 250 MG/5ML solution 500 mg, 500 mg, Per Tube, Q8H, Candee Furbish, MD, 500 mg at 03/15/21 0606    Family History  Problem Relation Age of Onset   Healthy Mother    Healthy Father     Social History   Socioeconomic History   Marital status: Single    Spouse name: Not on file   Number of children: Not on file   Years of education: Not on file   Highest education level: Not on file  Occupational History   Not on file  Tobacco Use   Smoking status: Never   Smokeless tobacco: Never  Substance and Sexual Activity   Alcohol use: Not Currently   Drug use: Not Currently   Sexual activity: Not on file  Other Topics Concern   Not on file  Social History  Narrative   Lives at home with mom and maternal grandmother and two siblings attends [employer] is in the 8th grade.    Social Determinants of Health   Financial Resource Strain: Not on file  Food Insecurity: Not on file  Transportation Needs: Not on file  Physical Activity: Not on file  Stress: Not on file  Social Connections: Not on file    Review of Systems: A 12 point ROS discussed and pertinent positives are indicated in the HPI above.  All other systems are negative.  Review of Systems  Vital Signs: BP (!) 99/48   Pulse (!) 101   Temp 99 F (37.2 C) (Axillary)   Resp 18   Ht _0  (1.575 m)   Wt 54.4 kg   LMP 01/11/2021 Comment: neg preg test  SpO2 100%  BMI 21.94 kg/m   Physical Exam Constitutional:      General: She is not in acute distress.    Appearance: She is not toxic-appearing.  HENT:     Mouth/Throat:     Mouth: Mucous membranes are moist.     Pharynx: Oropharynx is clear.  Cardiovascular:     Rate and Rhythm: Normal rate and regular rhythm.     Heart sounds: Normal heart sounds.  Pulmonary:     Breath sounds: Normal breath sounds. No rhonchi or rales.  Abdominal:     General: There is no distension.     Palpations: Abdomen is soft. There is no mass.  Skin:    General: Skin is warm and dry.    Imaging: CT ANGIO HEAD W OR WO CONTRAST  Result Date: 03/03/2021 CLINICAL DATA:  Initial evaluation for neuro deficit, right-sided mydriasis. EXAM: CT ANGIOGRAPHY HEAD TECHNIQUE: Multidetector CT imaging of the head was performed using the standard protocol during bolus administration of intravenous contrast. Multiplanar CT image reconstructions and MIPs were obtained to evaluate the vascular anatomy. CONTRAST:  57m OMNIPAQUE IOHEXOL 350 MG/ML SOLN COMPARISON:  Prior CT and MRI from 03/01/2021 FINDINGS: CT HEAD Brain: Cerebral volume within normal limits for patient age. No evidence for acute intracranial hemorrhage. No findings to suggest acute  large vessel territory infarct. No mass lesion, midline shift, or mass effect. Ventricles are normal in size without evidence for hydrocephalus. No extra-axial fluid collection identified. Vascular: No hyperdense vessel identified. Skull: Scalp soft tissues demonstrate no acute abnormality. Calvarium intact. Sinuses/Orbits: Globes and orbital soft tissues within normal limits. Moderate mucosal thickening in opacity seen within the visualized sphenoid ethmoidal sinuses. Trace right with small left bilateral mastoid effusions. CTA HEAD Anterior circulation: Both internal carotid arteries widely patent to the termini without stenosis. A1 segments widely patent. Normal anterior communicating artery complex. Both anterior cerebral arteries widely patent to their distal aspects without stenosis. No M1 stenosis or occlusion. Normal MCA bifurcations. Distal MCA branches well perfused and symmetric. Posterior circulation: Both vertebral arteries patent to the vertebrobasilar junction without stenosis. Left PICA patent. Right PICA not definitely seen. Basilar patent to its distal aspect without stenosis. Superior cerebellar arteries patent bilaterally. Both PCAs primarily supplied via the basilar, although small bilateral posterior communicating arteries are noted. PCAs well perfused or distal aspects without stenosis. Venous sinuses: Patent allowing for timing the contrast bolus. Anatomic variants: None significant. No intracranial aneurysm or other vascular abnormality. Review of the MIP images confirms the above findings. IMPRESSION: 1. Normal CTA of the head. No large vessel occlusion or other acute abnormality. No findings to explain patient's symptoms. No aneurysm. 2. No other acute intracranial abnormality. Electronically Signed   By: BJeannine BogaM.D.   On: 03/03/2021 04:56   DG Chest 1 View  Result Date: 02/24/2021 CLINICAL DATA:  Altered mental status.  Pneumonia. EXAM: CHEST  1 VIEW COMPARISON:   02/20/2021 FINDINGS: Endotracheal tube tip 4 cm above the carina. Left arm PICC tip in the SVC 2 cm above the right atrium. Orogastric or nasogastric tube enters the stomach. Right internal jugular central line tip in the SVC at the azygos level. Right lung remains clear. The patient has developed left lower lobe consolidation and some volume loss. IMPRESSION: Lines and tubes well positioned. Left lower lobe pneumonia now present. Electronically Signed   By: MNelson ChimesM.D.   On: 02/24/2021 14:08   DG Chest 1 View  Result Date: 02/18/2021 CLINICAL DATA:  Epistaxis EXAM:  CHEST  1 VIEW COMPARISON:  Chest radiograph from one day prior. FINDINGS: Stable cardiomediastinal silhouette with normal heart size. No pneumothorax. No pleural effusion. Lungs appear clear, with no acute consolidative airspace disease and no pulmonary edema. IMPRESSION: No active disease. Electronically Signed   By: Ilona Sorrel M.D.   On: 02/18/2021 14:41   DG Chest 1 View  Result Date: 02/17/2021 CLINICAL DATA:  Fever. EXAM: CHEST  1 VIEW COMPARISON:  Chest x-ray 02/16/2021. FINDINGS: Enteric tube extends below the diaphragm. Distal tip is not included on the image. There is no focal lung infiltrate, pleural effusion or pneumothorax. The cardiomediastinal silhouette is within normal limits for projection. No acute fractures are seen. IMPRESSION: No active disease. Electronically Signed   By: Ronney Asters M.D.   On: 02/17/2021 20:44   DG Abd 1 View  Result Date: 02/25/2021 CLINICAL DATA:  Vomiting, abdominal distention EXAM: ABDOMEN - 1 VIEW COMPARISON:  02/22/2021 FINDINGS: There is interval gaseous distention of colon measuring up to 6.3 cm. Stomach is not distended. There is no significant small bowel dilation. Rectum is not distended. Tip of enteric tube is seen in the stomach. IMPRESSION: There is gaseous distention of colon measuring up to 6.3 cm. This may suggest ileus. Less likely possibility would be distal colonic  obstruction. Electronically Signed   By: Elmer Picker M.D.   On: 02/25/2021 16:44   CT HEAD WO CONTRAST (5MM)  Result Date: 03/06/2021 CLINICAL DATA:  Mental status change with unknown cause EXAM: CT HEAD WITHOUT CONTRAST TECHNIQUE: Contiguous axial images were obtained from the base of the skull through the vertex without intravenous contrast. COMPARISON:  03/01/2021 FINDINGS: Brain: No evidence of acute infarction, hemorrhage, hydrocephalus, extra-axial collection or mass lesion/mass effect. Vascular: No hyperdense vessel or unexpected calcification. Skull: Normal. Negative for fracture or focal lesion. Sinuses/Orbits: No acute finding. IMPRESSION: Negative head CT. Electronically Signed   By: Jorje Guild M.D.   On: 03/06/2021 05:47   CT HEAD WO CONTRAST (5MM)  Result Date: 03/01/2021 CLINICAL DATA:  Neuro deficit, acute, stroke suspected Blown right pupil EXAM: CT HEAD WITHOUT CONTRAST TECHNIQUE: Contiguous axial images were obtained from the base of the skull through the vertex without intravenous contrast. COMPARISON:  MRI 02/22/2021 FINDINGS: Brain: No evidence of acute intracranial hemorrhage or extra-axial collection.No evidence of mass lesion/concern mass effect.The ventricles are normal in size. Vascular: No hyperdense vessel or unexpected calcification. Skull: Normal. Negative for fracture or focal lesion. Sinuses/Orbits: No acute finding. Other: None. IMPRESSION: No acute intracranial abnormality. Electronically Signed   By: Maurine Simmering M.D.   On: 03/01/2021 16:17   MR BRAIN W WO CONTRAST  Result Date: 03/01/2021 CLINICAL DATA:  Mental status change, persistent or worsening blown right people EXAM: MRI HEAD WITHOUT AND WITH CONTRAST TECHNIQUE: Multiplanar, multiecho pulse sequences of the brain and surrounding structures were obtained without and with intravenous contrast. CONTRAST:  5.28m GADAVIST GADOBUTROL 1 MMOL/ML IV SOLN COMPARISON:  02/22/2021. FINDINGS: Brain: No  restricted diffusion to suggest acute infarct. No acute hemorrhage, cerebral edema, mass, mass effect, or midline shift. No abnormal enhancement. Vascular: Normal flow voids. Skull and upper cervical spine: Normal marrow signal. Sinuses/Orbits: Mucosal thickening in the sphenoid sinus, with scattered minor mucosal thickening in the ethmoid air cells. Other: The patient is intubated, with fluid noted in the nasopharynx and oropharynx. Fluid in the left-greater-than-right mastoid air cells. IMPRESSION: No acute intracranial process. No etiology is seen for the patient's blown right pupil. Electronically Signed   By: ABryson Ha  Vasan M.D.   On: 03/01/2021 19:29   MR BRAIN W WO CONTRAST  Result Date: 02/22/2021 CLINICAL DATA:  Seizure EXAM: MRI HEAD WITHOUT AND WITH CONTRAST TECHNIQUE: Multiplanar, multiecho pulse sequences of the brain and surrounding structures were obtained without and with intravenous contrast. CONTRAST:  12m GADAVIST GADOBUTROL 1 MMOL/ML IV SOLN COMPARISON:  02/13/2021 FINDINGS: Brain: No acute infarct, mass effect or extra-axial collection. No acute or chronic hemorrhage. Normal white matter signal, parenchymal volume and CSF spaces. The midline structures are normal. There is no abnormal contrast enhancement. Vascular: Major flow voids are preserved. Skull and upper cervical spine: Normal calvarium and skull base. Visualized upper cervical spine and soft tissues are normal. Sinuses/Orbits:Opacified nasopharynx and mild sphenoid sinus mucosal thickening. Normal orbits. IMPRESSION: Normal brain MRI. Electronically Signed   By: KUlyses JarredM.D.   On: 02/22/2021 18:51   MR BRAIN W WO CONTRAST  Result Date: 02/13/2021 CLINICAL DATA:  Mental status change, CNS infection suspected EXAM: MRI HEAD WITHOUT AND WITH CONTRAST TECHNIQUE: Multiplanar, multiecho pulse sequences of the brain and surrounding structures were obtained without and with intravenous contrast. CONTRAST:  651mGADAVIST  GADOBUTROL 1 MMOL/ML IV SOLN COMPARISON:  02/09/2021 FINDINGS: Brain: No acute infarction, hemorrhage, hydrocephalus, extra-axial collection, or mass lesion. The ventricles and sulci are within normal limits for age. No abnormal enhancement. No foci of hemosiderin deposition to suggest remote hemorrhage. Vascular: Normal flow voids. Skull and upper cervical spine: Normal marrow signal. Sinuses/Orbits: Negative. Other: The mastoids are well aerated. IMPRESSION: Normal MRI of brain. Electronically Signed   By: AlMerilyn Baba.D.   On: 02/13/2021 22:47   MR PELVIS W WO CONTRAST  Result Date: 02/18/2021 CLINICAL DATA:  [age]ear old female with resistant NMDA receptor encephalitis, request to exclude ovarian teratoma. EXAM: MRI PELVIS WITHOUT AND WITH CONTRAST TECHNIQUE: Multiplanar multisequence MR imaging of the pelvis was performed both before and after administration of intravenous contrast. CONTRAST:  47m54mADAVIST GADOBUTROL 1 MMOL/ML IV SOLN COMPARISON:  02/13/2021 CT chest, abdomen and pelvis. 02/17/2021 pelvic sonogram. FINDINGS: Urinary Tract: Indwelling Foley catheter. Expected gas within the nondependent bladder. Otherwise normal bladder. Normal urethra. Bowel: Visualized small and large bowel are normal caliber with no bowel wall thickening. Vascular/Lymphatic: No pathologically enlarged lymph nodes in the pelvis. No acute vascular abnormality. Reproductive: Uterus: The normal size anteverted retroflexed uterus measures 6.5 x 2.6 x 3.3 cm. No uterine fibroids. Inner myometrium (junctional zone) measures 3 mm in thickness, which is normal. Endometrium measures 4 mm in bilayer thickness, which is normal. No endometrial cavity fluid or focal endometrial mass. Unremarkable uterine cervix. Ovaries and Adnexa: The right ovary measures 2.4 x 1.6 x 1.9 cm and is normal. The left ovary measures 2.4 x 1.5 x 1.9 cm and is normal. There are no abnormal ovarian or adnexal masses. Other: No abnormal free fluid in the  pelvis. No focal pelvic fluid collection. Musculoskeletal: No aggressive appearing focal osseous lesions. IMPRESSION: Normal pelvic MRI.  Symmetric normal ovaries. No adnexal masses. Electronically Signed   By: JasIlona SorrelD.   On: 02/18/2021 20:03   US [place] (TRANSABDOMINAL ONLY)  Result Date: 02/17/2021 CLINICAL DATA:  Ovarian mass/teratoma. EXAM: TRANSABDOMINAL ULTRASOUND OF PELVIS TECHNIQUE: Transabdominal ultrasound examination of the pelvis was performed including evaluation of the uterus, ovaries, adnexal regions, and pelvic cul-de-sac. COMPARISON:  CT chest abdomen and pelvis 02/13/2021. FINDINGS: Uterus Measurements: 5.5 x 3.5 x 3.3 cm = volume: 33 mL. No fibroids or other mass visualized. Endometrium Thickness: 8 mm.  No  focal abnormality visualized. Right ovary Measurements: Not seen.  No adnexal masses are identified. Left ovary Measurements: 2.8 x 1.7 x 2.1 cm = volume: 5 mL. Normal appearance/no adnexal mass. Other findings: There is a trace amount of free fluid in the pelvis. There is a Foley catheter in the bladder. IMPRESSION: 1. Right ovary not visualized. 2. Normal appearance of left ovary and uterus. 3. Trace free fluid in the pelvis. Electronically Signed   By: Ronney Asters M.D.   On: 02/17/2021 22:32   DG Chest Port 1 View  Result Date: 03/15/2021 CLINICAL DATA:  Respiratory failure EXAM: PORTABLE CHEST 1 VIEW COMPARISON:  Chest x-ray 03/13/2021 FINDINGS: Tracheostomy tube is stable. Enteric tube tip is below the diaphragm beyond the field of view. Heart size is normal. Mediastinum appears stable. Persistent hazy opacities in the bilateral mid to lower lung zones and increased peribronchial opacities in the upper lungs. No new consolidation identified. No pneumothorax. IMPRESSION: No significant change since previous study. Electronically Signed   By: Ofilia Neas M.D.   On: 03/15/2021 08:24   DG CHEST PORT 1 VIEW  Result Date: 03/13/2021 CLINICAL DATA:  Respiratory  failure EXAM: PORTABLE CHEST 1 VIEW COMPARISON:  March 06, 2021 FINDINGS: The feeding tube terminates below today's film. A tracheostomy tube is in good position. The left PICC line is been removed. No pneumothorax. Haziness over the bases identified. The cardiomediastinal silhouette is stable. IMPRESSION: 1. Support apparatus as above. 2. Haziness over the bases may represent layering effusions with underlying opacities. Developing infiltrates without effusion possible. A PA and lateral chest x-ray could better evaluate. Electronically Signed   By: Dorise Bullion III M.D.   On: 03/13/2021 19:25   DG CHEST PORT 1 VIEW  Result Date: 03/06/2021 CLINICAL DATA:  Pneumonia EXAM: PORTABLE CHEST 1 VIEW COMPARISON:  03/02/2021 FINDINGS: Tracheostomy in satisfactory position. Right IJ venous catheter terminates in the mid SVC. Left arm PICC terminates at the cavoatrial junction. Lungs are clear.  No pleural effusion or pneumothorax. The heart is normal in size. Enteric tube coursing into the stomach. IMPRESSION: No evidence of acute cardiopulmonary disease. Support apparatus as above. Electronically Signed   By: Julian Hy M.D.   On: 03/06/2021 02:11   DG Chest Port 1 View  Result Date: 03/02/2021 CLINICAL DATA:  Tracheostomy and nasogastric tube placement.  Fever. EXAM: PORTABLE CHEST 1 VIEW COMPARISON:  03/01/2021. FINDINGS: The tracheostomy tube tip is 1.8 cm from the carina. Left PICC line tip: SVC. Right internal jugular central venous catheter tip projects over the upper SVC. Nasogastric tube side port is in the stomach body. Distal tip is also in the stomach body. The lungs appear clear. Cardiac and mediastinal margins appear normal. No blunting of the costophrenic angles. IMPRESSION: 1. Interval clearing of opacity in the left lower lobe. 2. Support apparatus appears satisfactorily positioned. Electronically Signed   By: Van Clines M.D.   On: 03/02/2021 14:56   DG CHEST PORT 1  VIEW  Result Date: 03/01/2021 CLINICAL DATA:  Fever EXAM: PORTABLE CHEST 1 VIEW.  Patient is rotated COMPARISON:  Chest x-ray 02/20/2021 FINDINGS: Endotracheal tube with tip terminating 3.5 cm above the carina. Right internal jugular central venous catheter with tip overlying the expected region of the superior vena cava. Left PICC with tip overlying the expected region of the superior cavoatrial junction. Enteric tube coursing below the hemidiaphragm with tip and side port collimated off view. The heart and mediastinal contours are within normal limits. No focal consolidation.  No pulmonary edema. Slightly limited evaluation of the left costophrenic angle due to patient rotation. No pleural effusion. No pneumothorax. No acute osseous abnormality. IMPRESSION: 1. Lines and tubes in grossly appropriate position with limited evaluation due to patient rotation. 2. No definite acute cardiopulmonary abnormality. Electronically Signed   By: Iven Finn M.D.   On: 03/01/2021 21:35   DG CHEST PORT 1 VIEW  Result Date: 02/25/2021 CLINICAL DATA:  Respiratory failure EXAM: PORTABLE CHEST 1 VIEW COMPARISON:  Chest x-ray 02/24/2021 FINDINGS: Endotracheal tube tip is 1.8 cm above the carina. Left PICC tip in the SVC. Right internal jugular line tip in proximal SVC. Enteric tube tip below the diaphragm. Heart size is normal. Mediastinum appears stable. Right lung and pleura remain clear. Persistent hazy opacities in the left mid to lower lung zone. No significant pleural effusion. No pneumothorax. IMPRESSION: 1. Medical devices as described. 2. Persistent opacities/infiltrates in the left lung. Electronically Signed   By: Ofilia Neas M.D.   On: 02/25/2021 08:14   DG CHEST PORT 1 VIEW  Result Date: 02/20/2021 CLINICAL DATA:  Central line placement. EXAM: PORTABLE CHEST 1 VIEW COMPARISON:  02/20/2021 and prior radiographs FINDINGS: The cardiomediastinal silhouette is unremarkable. A RIGHT IJ central venous  catheter is now noted with tip overlying the mid SVC. Endotracheal tube and NG tube are again identified. There is no evidence of focal airspace disease, pulmonary edema, suspicious pulmonary nodule/mass, pleural effusion, or pneumothorax. No acute bony abnormalities are identified. IMPRESSION: RIGHT IJ central venous catheter with tip overlying the mid SVC. No pneumothorax or other significant change. Electronically Signed   By: Margarette Canada M.D.   On: 02/20/2021 16:50   DG CHEST PORT 1 VIEW  Result Date: 02/20/2021 CLINICAL DATA:  Encephalitis. EXAM: PORTABLE CHEST 1 VIEW COMPARISON:  02/19/2021 FINDINGS: The endotracheal tube remains in good position approximately 2.7 cm above the carina. The NG tube is coursing down the esophagus and into the stomach. Streaky basilar atelectasis but no infiltrates or effusions. No pneumothorax. IMPRESSION: 1. Support apparatus in good position. 2. Streaky bibasilar atelectasis. Electronically Signed   By: Marijo Sanes M.D.   On: 02/20/2021 11:35   Portable Chest x-ray  Result Date: 02/19/2021 CLINICAL DATA:  OG an NG tube EXAM: PORTABLE CHEST 1 VIEW COMPARISON:  02/19/2021 FINDINGS: Endotracheal tube tip is about 2.6 cm superior to carina. Esophageal tube tip below the diaphragm but incompletely visualized. No focal opacity, pleural effusion or pneumothorax. Normal cardiac size. IMPRESSION: 1. Endotracheal tube tip about 2.6 cm superior to carina. 2. Clear lung fields Electronically Signed   By: Donavan Foil M.D.   On: 02/19/2021 16:48   DG CHEST PORT 1 VIEW  Result Date: 02/19/2021 CLINICAL DATA:  Shortness of breath EXAM: PORTABLE CHEST 1 VIEW COMPARISON:  Chest x-ray dated February 18, 2021 FINDINGS: The heart size and mediastinal contours are within normal limits. Both lungs are clear. The visualized skeletal structures are unremarkable. IMPRESSION: No active disease. Electronically Signed   By: Yetta Glassman M.D.   On: 02/19/2021 11:57   DG CHEST PORT 1  VIEW  Result Date: 02/16/2021 CLINICAL DATA:  Dyspnea. EXAM: PORTABLE CHEST 1 VIEW COMPARISON:  02/09/2021 FINDINGS: The heart size and mediastinal contours are within normal limits. Both lungs are clear. The visualized skeletal structures are unremarkable. IMPRESSION: No active disease. Electronically Signed   By: Kerby Moors M.D.   On: 02/16/2021 05:59   DG Abd Portable 1V  Result Date: 03/03/2021 CLINICAL DATA:  Feeding tube  placement EXAM: PORTABLE ABDOMEN - 1 VIEW COMPARISON:  02/25/2021 FINDINGS: Enteric tube overlies the distal body of the stomach. Unremarkable bowel gas pattern. IMPRESSION: Enteric tube within stomach. Electronically Signed   By: Macy Mis M.D.   On: 03/03/2021 10:49   DG Abd Portable 1V  Result Date: 02/22/2021 CLINICAL DATA:  OG tube placement. EXAM: PORTABLE ABDOMEN - 1 VIEW COMPARISON:  Abdominal x-ray 02/19/2021. FINDINGS: Orogastric tube tip is at the level of the distal body of the stomach. No dilated bowel loops are seen. No acute fractures. Visualized lung bases are clear. IMPRESSION: 1. Orogastric tube tip at the level of the distal body of the stomach. 2. Nonobstructive bowel gas pattern. Electronically Signed   By: Ronney Asters M.D.   On: 02/22/2021 23:01   DG Abd Portable 1V  Result Date: 02/19/2021 CLINICAL DATA:  OG tube placement EXAM: PORTABLE ABDOMEN - 1 VIEW COMPARISON:  02/17/2021 FINDINGS: Esophageal tube tip overlies distal stomach. Mild air distension of colon without obstruction. Moderate to large stool burden. IMPRESSION: Esophageal tube tip overlies the distal stomach Electronically Signed   By: Donavan Foil M.D.   On: 02/19/2021 16:50   DG Abd Portable 1V  Result Date: 02/17/2021 CLINICAL DATA:  Feeding tube EXAM: PORTABLE ABDOMEN - 1 VIEW COMPARISON:  02/12/2021 FINDINGS: Esophageal tube tip overlies the distal stomach. Visible gas pattern unremarkable. IMPRESSION: Esophageal tube tip overlies the distal stomach Electronically  Signed   By: Donavan Foil M.D.   On: 02/17/2021 17:28   EEG adult  Result Date: 02/13/2021 Lora Havens, MD     02/13/2021  6:34 PM Patient Name: Jamie [name] MRN: [id number] Epilepsy Attending: Lora Havens Referring Physician/Provider: Dr Kerney Elbe Date: 10/22/ 2022 Duration: 22.39 mins  Patient history:  20 y.o. female with PMH significant for Hashimoto thyroiditis who presents with vomiting, headache and not eating for 4 days and not talking since today. EEG to evaluate for seizure  Level of alertness: lethargic, asleep  AEDs during EEG study: None  Technical aspects: This EEG study was done with scalp electrodes positioned according to the 10-20 International system of electrode placement. Electrical activity was acquired at a sampling rate of _0  and reviewed with a high frequency filter of _1  and a low frequency filter of _2 . EEG data were recorded continuously and digitally stored.  Description: EEG showed continuous generalized rhythmic 2-3 Hz  sharply contoured delta slowing. Sleep was characterized by sleep spindles (12-_3 ), maximal frontocentral region. Sharp transient was noted in left temporal region.Hyperventilation and photic stimulation were not performed.    ABNORMALITY - Generalized rhythmic delta activity ( GRDA)  IMPRESSION: This study showed generalized rhythmic delta activity which is on the ictal-interictal continuum with low potential for seizures. There is also severe diffuse encephalopathy, nonspecific etiology. No seizures or definite epileptiform discharges were seen throughout the recording. If concern for ictal-interictal activity persists, please consider long term monitoring.  Jamie [name]   Overnight EEG with video  Result Date: 03/07/2021 Lora Havens, MD     03/08/2021  9:27 AM Patient Name: Jamie [name] MRN: [id number] Epilepsy Attending: Lora Havens Referring Physician/Provider: Dr. Kathrynn Speed Duration: 11/12//2022  7829 to 03/07/2021 0937  Patient history:  20 y.o. female with PMH significant for Hashimoto thyroiditis who presents with vomiting, headache and not eating for 4 days and not talking since today. EEG to evaluate for seizure  Level of alertness: lethargic  AEDs during EEG study: LEV, VPA, Clonazepam  Technical aspects: This  EEG study was done with scalp electrodes positioned according to the 10-20 International system of electrode placement. Electrical activity was acquired at a sampling rate of _0  and reviewed with a high frequency filter of _1  and a low frequency filter of _2 . EEG data were recorded continuously and digitally stored.  Description: EEG initially showed continuous generalized high amplitude rhythmic 1 to 3 Hz delta slowing with overriding 15 to 18 Hz beta activity consistent with delta brush. Multiple episodes were captured during which patient had eyebrow twitching, mouth movements as well as bilateral upper extremity twitching.  Concomitant eeg showed continuous generalized high amplitude 1 to 3 Hz-delta slowing with overriding 15-_3  fronto-central beta activity ( extreme delta brush).  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 No definite seizures or epileptiform discharges were seen during this study. Multiple episodes were captured during which patient had eyebrow twitching, mouth movements as well as bilateral upper extremity twitching.  Concomitant eeg didn't  show any change to suggest seizure. These episodes are more likely not epileptic. Dyskinesias is in the differential.   Jamie O Yadav   Overnight EEG with video  Result Date: 03/02/2021 Lora Havens, MD     03/02/2021 10:14 AM Patient Name: Jamie [name] MRN: [id number] Epilepsy Attending: Lora Havens Referring Physician/Provider: Dr.  Kathrynn Speed Duration: 03/01/2021 1003 to 1728 Patient history:  20 y.o. female with PMH significant for Hashimoto thyroiditis who presents with vomiting, headache and not eating for 4 days and not talking since today. EEG to evaluate for seizure  Level of alertness: lethargic  AEDs during EEG study: LEV, VPA, Clonazepam  Technical aspects: This EEG study was done with scalp electrodes positioned according to the 10-20 International system of electrode placement. Electrical activity was acquired at a sampling rate of _4  and reviewed with a high frequency filter of _5  and a low frequency filter of _6 . EEG data were recorded continuously and digitally stored.  Description: EEG showed continuous generalized high amplitude rhythmic 1 to 3 Hz delta slowing with overriding 15 to 18 Hz beta activity consistent with delta brush.  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 No definite seizures or epileptiform discharges were seen during this study.    Jamie [name]   Overnight EEG with video  Result Date: 02/20/2021 Lora Havens, MD     02/21/2021  8:08 AM Patient Name: Jamie [name] MRN: [id number] Epilepsy Attending: Lora Havens Referring Physician/Provider: Dr Donnetta Simpers Duration: 02/19/2021 1628 to 02/20/2021 1628  Patient history:  20 y.o. female with PMH significant for Hashimoto thyroiditis who presents with vomiting, headache and not eating for 4 days and not talking since today. EEG to evaluate for seizure  Level of alertness: lethargic  AEDs during EEG study: LEV, VPA, Propofol  Technical aspects: This EEG study was done with scalp electrodes positioned according to the 10-20 International system of electrode placement. Electrical activity was acquired at a sampling rate of _7  and reviewed with a  high frequency filter of _8  and a low frequency filter of _9 . EEG data were recorded continuously and digitally stored.  Description: EEG initially showed continuous generalized high amplitude rhythmic sharply contoured 2-3 Hz  delta slowing. Sharp transient were also noted in left posterior quadrant.  Patient was noted to have intermittent left upper extremity jerking. Concomitant eeg showed similar high amplitude 2-_0  delta slowing with sharp transient in left posterior region. Propofol dose was adjusted. After around  2318 on 02/19/2021, EEG pattern evolved into continuous generalized polymorphic mixed frequencies with predominantly 3-_1  theta-delta slowing admixed with 15-_2  fronto-central beta activity.  ABNORMALITY - Generalized rhythmic delta activity ( GRDA) - Continuous slow, generalized  IMPRESSION: This study is suggestive of severe diffuse encephalopathy, nonspecific etiology.  Patient was noted to have intermittent left upper extremity jerking without definite eeg change. However focal motor seizures may not be seen on scalp eeg. Clinical correlation if recommended. Jamie [name]   Overnight EEG with video  Result Date: 02/16/2021 Lora Havens, MD     02/16/2021  3:03 PM EDITED REPORT Patient Name: Jamie [name] MRN: [id number] Epilepsy Attending: Lora Havens Referring Physician/Provider: Dr Kerney Elbe Duration: 02/15/2021  1021 to 02/16/2021 1021  Patient history:  20 y.o. female with PMH significant for Hashimoto thyroiditis who presents with vomiting, headache and not eating for 4 days and not talking since today. EEG to evaluate for seizure  Level of alertness: lethargic  AEDs during EEG study: None  Technical aspects: This EEG study was done with scalp electrodes positioned according to the 10-20 International system of electrode placement. Electrical activity was acquired at a sampling rate of _3  and reviewed with a high frequency filter of _4  and a low  frequency filter of _5 . EEG data were recorded continuously and digitally stored.  Description: EEG showed continuous generalized rhythmic 2-3 Hz  sharply contoured delta slowing.  One seizure was recorded on 02/16/2021 at 0054. During the seizure patient turned to the right side followed by generalized stiffening followed by whole body generalized tonic-clonic seizure-like activity. Concomitant EEG initially showed 9 to 10 Hz alpha activity in right frontocentral region which then involved all of left hemisphere and eventually evolved into 2 to 3 Hz sharply contoured delta slowing. Duration of the seizure was about 1 minute. Hyperventilation and photic stimulation were not performed.   Patient event button was pressed on 02/15/2021 at 1455 for left hand shaking.  Concomitant EEG before, during and after the event did not show any EEG changes suggest seizure. Patient event button was pressed on 02/16/2021 at 0149 during which patient was noted to be thrashing in bed and twitching.  Concomitant EEG before, during and after the event did not show any EEG changes suggest seizure.  ABNORMALITY -Focal seizure, right frontocentral region - Generalized rhythmic delta activity ( GRDA)  IMPRESSION: This study showed one seizure arising from right frontocentral region on 02/16/2021 at 0054. During the seizure patient  turned to the right side followed by whole body stiffening and eventually generalized tonic-clonic seizure-like activity.  Duration of the seizure about 1 minute.  Additionally EEG showed generalized rhythmic delta activity which is on the ictal-interictal continuum with low potential for seizures. There is also severe diffuse encephalopathy, nonspecific etiology. Patient event button was pressed on 02/15/2021 at 1455 for left hand shaking without concomitant EEG change. However, focal motor seizures may not be seen on scalp EEG.  Therefore clinical correlation is recommended. Patient event button was pressed  on 02/15/2021 at 1455 during which patient was noted to be thrashing in bed and twitching without concomitant EEG change.  This was most likely not an epileptic event.  Postictal confusion could be one of the differentials.  Lora Havens   ECHOCARDIOGRAM COMPLETE  Result Date: 02/25/2021  ECHOCARDIOGRAM REPORT   Patient Name:   Tishana Keirsey Date of Exam: 02/25/2021 Medical Rec #:  [id number]           Height:       62.0 in Accession #:    [id number]          Weight:       125.0 lb Date of Birth:  12/23/2000            BSA:          1.566 m Patient Age:    20 years            BP:           105/79 mmHg Patient Gender: F                   HR:           64 bpm. Exam Location:  Inpatient Procedure: 2D Echo, Cardiac Doppler and Color Doppler Indications:     MI  History:         Patient has no prior history of Echocardiogram examinations.  Sonographer:     Jyl Heinz Referring Phys:  [place] Diagnosing Phys: Dixie Dials MD IMPRESSIONS  1. Left ventricular ejection fraction, by estimation, is 60 to 65%. The left ventricle has normal function. The left ventricle has no regional wall motion abnormalities. Left ventricular diastolic parameters were normal.  2. Right ventricular systolic function is normal. The right ventricular size is normal.  3. A small pericardial effusion is present. The pericardial effusion is posterior to the left ventricle.  4. The mitral valve is normal in structure. Trivial mitral valve regurgitation.  5. Tricuspid valve regurgitation is mild to moderate.  6. The aortic valve is tricuspid. Aortic valve regurgitation is not visualized.  7. The inferior vena cava is normal in size with greater than 50% respiratory variability, suggesting right atrial pressure of 3 mmHg. FINDINGS  Left Ventricle: Left ventricular ejection fraction, by estimation, is 60 to 65%. The left ventricle has normal function. The left ventricle has no regional wall motion abnormalities. The left  ventricular internal cavity size was normal in size. There is  no left ventricular hypertrophy. Left ventricular diastolic parameters were normal. Right Ventricle: The right ventricular size is normal. No increase in right ventricular wall thickness. Right ventricular systolic function is normal. Left Atrium: Left atrial size was normal in size. Right Atrium: Right atrial size was normal in size. Pericardium: A small pericardial effusion is present. The pericardial effusion is posterior to the left ventricle. Mitral Valve: The mitral valve is normal in structure. Trivial mitral valve regurgitation. Tricuspid Valve: The tricuspid valve is normal in structure. Tricuspid valve regurgitation is mild to moderate. Aortic Valve: The aortic valve is tricuspid. Aortic valve regurgitation is not visualized. Aortic valve peak gradient measures 8.0 mmHg. Pulmonic Valve: The pulmonic valve was normal in structure. Pulmonic valve regurgitation is not visualized. Aorta: The aortic root is normal in size and structure. Venous: The inferior vena cava is normal in size with greater than 50% respiratory variability, suggesting right atrial pressure of 3 mmHg. IAS/Shunts: The atrial septum is grossly normal. Additional Comments: There is a small pleural effusion.  LEFT VENTRICLE PLAX 2D LVIDd:         4.10 cm     Diastology LVIDs:         2.00 cm     LV e' medial:    14.70 cm/s LV PW:  0.80 cm     LV E/e' medial:  6.4 LV IVS:        0.70 cm     LV e' lateral:   25.40 cm/s LVOT diam:     1.80 cm     LV E/e' lateral: 3.7 LV SV:         56 LV SV Index:   36 LVOT Area:     2.54 cm  LV Volumes (MOD) LV vol d, MOD A2C: 77.8 ml LV vol d, MOD A4C: 81.7 ml LV vol s, MOD A2C: 32.9 ml LV vol s, MOD A4C: 33.6 ml LV SV MOD A2C:     44.9 ml LV SV MOD A4C:     81.7 ml LV SV MOD BP:      46.4 ml RIGHT VENTRICLE             IVC RV Basal diam:  2.80 cm     IVC diam: 1.20 cm RV Mid diam:    2.80 cm RV S prime:     23.30 cm/s TAPSE (M-mode): 2.4  cm LEFT ATRIUM           Index        RIGHT ATRIUM          Index LA diam:      2.30 cm 1.47 cm/m   RA Area:     9.74 cm LA Vol (A2C): 24.1 ml 15.39 ml/m  RA Volume:   19.50 ml 12.46 ml/m LA Vol (A4C): 19.2 ml 12.26 ml/m  AORTIC VALVE AV Area (Vmax): 2.39 cm AV Vmax:        141.00 cm/s AV Peak Grad:   8.0 mmHg LVOT Vmax:      132.50 cm/s LVOT Vmean:     96.600 cm/s LVOT VTI:       0.219 m  AORTA Ao Root diam: 2.20 cm Ao Asc diam:  2.20 cm MITRAL VALVE               TRICUSPID VALVE MV Area (PHT): 7.37 cm    TR Peak grad:   25.4 mmHg MV Decel Time: 103 msec    TR Vmax:        252.00 cm/s MV E velocity: 93.40 cm/s                            SHUNTS                            Systemic VTI:  0.22 m                            Systemic Diam: 1.80 cm Dixie Dials MD Electronically signed by Dixie Dials MD Signature Date/Time: 02/25/2021/9:49:28 AM    Final    VAS [place] LOWER EXTREMITY VENOUS (DVT)  Result Date: 03/08/2021  Lower Venous DVT Study Patient Name:  Jamie [name]  Date of Exam:   03/08/2021 Medical Rec #: [id number]            Accession #:    [id number] Date of Birth: Mar 06, 2001             Patient Gender: F Patient Age:   86 years Exam Location:  [facility] Procedure:      VAS [place] LOWER EXTREMITY VENOUS (DVT) Referring Phys: Velna Hatchet OLLIS --------------------------------------------------------------------------------  Indications: Edema.  Risk Factors:  None identified. Limitations: Patient positioning, patient constant movement. Comparison Study: No prior studies. Performing Technologist: Oliver Hum RVT  Examination Guidelines: A complete evaluation includes B-mode imaging, spectral Doppler, color Doppler, and power Doppler as needed of all accessible portions of each vessel. Bilateral testing is considered an integral part of a complete examination. Limited examinations for reoccurring indications may be performed as noted. The reflux portion of the exam is performed with the patient in  reverse Trendelenburg.  +---------+---------------+---------+-----------+----------+--------------+ RIGHT    CompressibilityPhasicitySpontaneityPropertiesThrombus Aging +---------+---------------+---------+-----------+----------+--------------+ CFV      Full           Yes      Yes                                 +---------+---------------+---------+-----------+----------+--------------+ SFJ      Full                                                        +---------+---------------+---------+-----------+----------+--------------+ FV Prox  Full                                                        +---------+---------------+---------+-----------+----------+--------------+ FV Mid   Full                                                        +---------+---------------+---------+-----------+----------+--------------+ FV DistalFull                                                        +---------+---------------+---------+-----------+----------+--------------+ PFV      Full                                                        +---------+---------------+---------+-----------+----------+--------------+ POP      Full           Yes      Yes                                 +---------+---------------+---------+-----------+----------+--------------+ PTV      Full                                                        +---------+---------------+---------+-----------+----------+--------------+ PERO     Full                                                        +---------+---------------+---------+-----------+----------+--------------+   +---------+---------------+---------+-----------+----------+--------------+  LEFT     CompressibilityPhasicitySpontaneityPropertiesThrombus Aging +---------+---------------+---------+-----------+----------+--------------+ CFV      Full           Yes      Yes                                  +---------+---------------+---------+-----------+----------+--------------+ SFJ      Full                                                        +---------+---------------+---------+-----------+----------+--------------+ FV Prox  Full                                                        +---------+---------------+---------+-----------+----------+--------------+ FV Mid   Full                                                        +---------+---------------+---------+-----------+----------+--------------+ FV DistalFull                                                        +---------+---------------+---------+-----------+----------+--------------+ PFV      Full                                                        +---------+---------------+---------+-----------+----------+--------------+ POP      Full           Yes      Yes                                 +---------+---------------+---------+-----------+----------+--------------+ PTV      Full                                                        +---------+---------------+---------+-----------+----------+--------------+ PERO     Full                                                        +---------+---------------+---------+-----------+----------+--------------+     Summary: RIGHT: - There is no evidence of deep vein thrombosis in the lower extremity. However, portions of this examination were limited- see technologist comments above.  - No cystic structure found in the popliteal fossa.  LEFT: - There is no evidence  of deep vein thrombosis in the lower extremity. However, portions of this examination were limited- see technologist comments above.  - No cystic structure found in the popliteal fossa.  *See table(s) above for measurements and observations. Electronically signed by Monica Martinez MD on 03/08/2021 at 4:41:54 PM.    Final    ECHOCARDIOGRAM LIMITED  Result Date: 03/05/2021     ECHOCARDIOGRAM LIMITED REPORT   Patient Name:   Jamie [name] Date of Exam: 03/05/2021 Medical Rec #:  [id number]           Height:       62.0 in Accession #:    [id number]          Weight:       123.9 lb Date of Birth:  Mar 11, 2001            BSA:          1.560 m Patient Age:    20 years            BP:           98/67 mmHg Patient Gender: F                   HR:           90 bpm. Exam Location:  Inpatient Procedure: Limited Echo Indications:     Pericardial effusion I31.3  History:         Patient has prior history of Echocardiogram examinations, most                  recent 02/25/2021. 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 Acute encephalopathy.                  Acute pericarditis with small pericardial effusion. Hashimoto                  thyroiditis.  Sonographer:     Darlina Sicilian RDCS Referring Phys:  [place] Diagnosing Phys: Dixie Dials MD  Sonographer Comments: Echo performed with patient supine and on artificial respirator. IMPRESSIONS  1. Left ventricular ejection fraction, by estimation, is 55 to 60%. The left ventricle has normal function. The left ventricle has no regional wall motion abnormalities. Left ventricular diastolic function could not be evaluated.  2. Right ventricular systolic function is normal.  3. Significant reduction in pericardial effusion.. The pericardial effusion is posterior to the left ventricle. There is no evidence of cardiac tamponade.  4. The mitral valve is normal in structure.  5. The aortic valve is normal in structure.  6. The inferior vena cava is normal in size with <50% respiratory variability, suggesting right atrial pressure of 8 mmHg. FINDINGS  Left Ventricle: Left ventricular ejection fraction, by estimation, is 55 to 60%. The left ventricle has normal function. The left ventricle has no regional wall motion abnormalities. The left ventricular internal cavity size was normal in size. There  is  no left ventricular hypertrophy. Left ventricular diastolic function could not be evaluated. Right Ventricle: Right ventricular systolic function is normal. Left Atrium: Left atrial size was normal in size. Right Atrium: Right atrial size was normal in size. Pericardium: Significant reduction in pericardial effusion. Trivial pericardial effusion is present. The pericardial effusion is posterior to the left ventricle. There is no evidence of cardiac tamponade. Mitral Valve: The mitral valve is normal in  structure. Tricuspid Valve: The tricuspid valve is normal in structure. Aortic Valve: The aortic valve is normal in structure. Pulmonic Valve: The pulmonic valve was not well visualized. Aorta: The aortic root is normal in size and structure. Venous: The inferior vena cava is normal in size with less than 50% respiratory variability, suggesting right atrial pressure of 8 mmHg. Dixie Dials MD Electronically signed by Dixie Dials MD Signature Date/Time: 03/05/2021/11:49:50 AM    Final    [place] EKG SITE RITE  Result Date: 02/23/2021 If Site Rite image not attached, placement could not be confirmed due to current cardiac rhythm.   Labs:  CBC: Recent Labs    03/11/21 0424 03/11/21 0601 03/12/21 0345 03/13/21 0543 03/15/21 0424  WBC 7.2  --  11.0* 12.6* 9.7  HGB 5.3* 8.3* 8.3* 8.5* 8.3*  HCT 18.4* 27.5* 27.5* 28.0* 28.4*  PLT 236  --  343 386 496*    COAGS: No results for input(s): INR, APTT in the last 8760 hours.  BMP: Recent Labs    03/12/21 0345 03/13/21 0543 03/14/21 1337 03/15/21 0424  NA 137 139 142 141  K 4.1 3.3* 3.9 3.4*  CL 101 101 105 106  CO2 _0 GLUCOSE 128* 179* 131* 134*  BUN _1 CALCIUM 8.6* 8.7* 8.8* 8.6*  CREATININE 0.35* 0.44 0.42* 0.34*  GFRNONAA >60 >60 >60 >60    LIVER FUNCTION TESTS: Recent Labs    03/01/21 0414 03/02/21 0436 03/06/21 0756 03/13/21 0543  BILITOT 0.3 0.6 0.3 0.3  AST _2 ALT _3 ALKPHOS 27* 26*  32* 52  PROT 5.2* 5.2* 6.5 6.2*  ALBUMIN 3.1* 2.9* 3.5 2.4*    TUMOR MARKERS: No results for input(s): AFPTM, CEA, CA199, CHROMGRNA in the last 8760 hours.  Assessment and Plan: Respiratory failure s/p tracheostomy Dysphagia/FTT Imaging reviewed. Long discussion with mother at bedside, explained role for G-tube. Risks and benefits image guided gastrostomy tube placement was discussed with the patient including, but not limited to the need for a barium enema during the procedure, bleeding, infection, peritonitis and/or damage to adjacent structures.  All questions were answered, agreeable to proceed.  Consent signed by mother and in chart.   Thank you for this interesting consult.  I greatly enjoyed meeting Jamie [name] and look forward to participating in their care.  A copy of this report was sent to the requesting provider on this date.  Electronically Signed: Ascencion Dike, PA-C 03/15/2021, 11:28 AM   I spent a total of 20 minutes in face to face in clinical consultation, greater than 50% of which was counseling/coordinating care for G-tube placement

## 2021-03-15 NOTE — Progress Notes (Signed)
NAME:  Jamie [name], MRN:  [id number], DOB:  [birth year], LOS: 78 ADMISSION DATE:  02/06/2021, CONSULTATION DATE:  10/28 REFERRING MD:  Raymondo Band, CHIEF COMPLAINT:  Seizure, acute metabolic encephalopathy   History of Present Illness:  20 y/o female admitted with seizure and change in mental status.  She was treated with steroids and IVIg but despite this her condition worsened.  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  After treatment her mental status worsened and she developed inability to protect her airway in the setting of tachypnea and tachycardia.  She required intubation on 10/28, trach 11/8.   Pertinent  Medical History  Acquired autoimmune hypothyroidism  Significant Hospital Events: Including procedures, antibiotic start and stop dates in addition to other pertinent events   10/16 Admit 10/25 New onset seizure activity, PCCM consult 1026 pulmonary critical care signed off 02/19/2021 pulmonary critical care reconsulted for suspected respiratory distress.  intubated, continuous EEG started 10/30 starting plasmapheresis 10/31 MRI normal, EEG with GPD+ 11/1 RN reports pt seized while in MRI , additional agents added overnight.   11/5 completed 4th session of PLEX 11/7 due for 5/5 PLEX. Afternoon R blown pupil. No etiology on imaging.  11/8 trach 11/12 Rhythmic movements concerned for seizures, fever 102. Neuro evaluated for dyskinesia 11/13 Overnight continued to have dyskinetic movements associated with tachycardia. EEG with no seizure. 11/16 LP with 41 WBC 96 lymphocytes   Micro/labs: 10/16 influenza AB PCR negative, SARS-CoV-2 PCR negative 10/16 HIV antibody negative 10/16 urine culture multiple species suggest recollection 10/16 urine drug screen negative, alcohol less than 10 10/17 CSF lymphocytic pleocytosis, fungal culture negative, CSF culture negative 10/17 CSF HSV 1 DNA negative, HSV-2 DNA negative, VZV PCR CSF negative 10/17 CSF cryptococcal  antigen negative, cryptococcal antigen titer not indicated 10/17 CSF EBV positive  10/17 CMV qualitative CSF negative 10/17 NMDA IgG on CSF > 1:2560 10/18 HIV RNA quant less than 20 10/20 NMDA IgG serum: positive 1:1000 10/22 RPR nonreactive 10/22 QuantiFERON gold intermediate 10/23 EBV DNA QuantiFERON by PCR positive, less than 35 10/25 hepatitis B surface antigen nonreactive, hepatitis B core total antibody positive, hepatitis B antibody positive, hepatitis B DNA not detected 10/25 hepatitis C antibody negative 10/26 blood culture no growth to date 11/2 Tracheal aspirate abundant MSSA  11/19 Few gram positive cocci  Imaging: 10/16 CT head negative 10/16 MRI brain incomplete study due to patient intolerance without evidence of acute intracranial abnormality 10/22 chest abdomen pelvis CT scan no acute intrathoracic abdominal or pelvic pathology, no evidence of malignancy 10/22 MRI brain normal MRI of the brain 10/26 pelvic ultrasound right ovary not visualized, normal appearance of left ovary and uterus, trace free fluid in pelvis 10/27 pelvic MRI: Normal pelvic MRI, symmetric normal ovaries, no adnexal masses 10/31 MRI > no acute infarct or hemorrhage. Normal MRI.  11/7 CT H no acute abnormality 11/7 MRI brain no acute process  11/13 EEG suggestive of moderate to severe diffuse encephalopathy, likely related to NMDA encephalitis. No seizures or epileptiform discharges  11/14 Doppler ultrasound lower extremities negative for DVT  Procedures: Trach 11/8 >>  R IJ HD catheter 10/29 >>  RUE PICC 11/1 >> Trach 11/8 LP 11/16 > WBC 41 Lymphs 96  Interim History / Subjective:  Dr. Ander Slade called [facility] over the weekend-- accepted, but waiting on bed. Mother at bedside, gives inconsistent story about how her current acute presentation compares. Per multiple bedside nurses, what she is having this morning is a typical episode of her severe  symptoms, but has been ongoing for  weeks.  Objective   Blood pressure (!) 130/98, pulse (!) 115, temperature 99 F (37.2 C), temperature source Axillary, resp. rate 19, height 5\' 2"  (1.575 m), weight 54.4 kg, last menstrual period 01/11/2021, SpO2 100 %.    Vent Mode: PRVC FiO2 (%):  [30 %] 30 % Set Rate:  [18 bmp] 18 bmp Vt Set:  [400 mL] 400 mL PEEP:  [5 cmH20] 5 cmH20 Pressure Support:  [14 cmH20] 14 cmH20 Plateau Pressure:  [16 cmH20] 16 cmH20   Intake/Output Summary (Last 24 hours) at 03/15/2021 0849 Last data filed at 03/15/2021 0846 Gross per 24 hour  Intake 1913.98 ml  Output 1435 ml  Net 478.98 ml    Filed Weights   03/06/21 0146 03/07/21 0359 03/10/21 0400  Weight: 56.2 kg 56.1 kg 54.4 kg   Physical Exam: General: critically ill appearing woman lying in bed in NAD HEENT:  [place]/AT, eyes anicteric, no gaze deviation.   Neuro:  Rhythmic movements throuhgout her entire body, causing leg adduction, arm adduction, squeezing her eyes shut. Drooling. Not responding to verbal stimulation. CV: tachycardic, reg rhythm PULM: rhonchi, thick tan secretions from ETT GI: soft, NT Extremities: no clubbing or cyanosis Skin: warm, dry, no rashes  CXR personally reviewed> trach in appropriate position, silhouetting left lateral hemidiaphragm  Resp culture > MSSA  Assessment & Plan:   Acute hypoxemic respiratory failure Status post tracheostomy LLL pneumonia- MSSA, recurrent -Con't Blactam for 7 days-- can deescalate to ampicillin. Discussed with pharmacy. 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- severe -S/p 5 days of IV Solu-Medrol, 5 days of IVIG, 5 days of Plex -1 day of Rituxan given 11/10; next dose scheduled this week on 11/26 -MRI pelvis negative for ovarian mass/teratoma -additional dose of ativan this morning; con't fentanyl and morphine- not sure why we can't  consolidate this to just one opiate IV -increase clonazepam to 1 mg TID, still getting 1mg  QHS  Neuro storming episodes- whole body tonic clonic movements, tachypnea, tachycardia, hypertension -ativan, fentanyl, morphine PRN  -metoprolol increased to 50mg  BID -additional metoprolol dose 2.5mg  IV now after minimal response to additional dose of ativan. -may need to consider propofol-- will discuss with neurology -recheck TSH-- last checked > 1 month ago -At Neurology's request, consult to PMR to help with controlling symptoms of neuro storm.   Pericarditis Pericardial effusion> better on 11/11 echo -Con't colchicine -tele monitoring  Acute anemia critical illness -monitor -transfuse for Hb<7 or hemodynamically significant bleeding  Hashimoto's thyroiditis -con't synthroid -recheck [facility] was contacted for possible transfer again over the weekend by Dr. Ander Slade -Patient was accepted pending bed availability; Dr. Hortense Ramal to call them back again today.  Appreciate Palliative Care team's input to help support the family. Continuing all aggressive care. I updated her mother at bedside and discussed my care plan and the need for transfer to a higher level of care at Neurology's recommendations. I am concerned without a response to her first dose of rituxumab, she may not respond to the second. A response will still take weeks.   Best Practice (right click and "Reselect all SmartList Selections" daily)  Diet/type: tubefeeds DVT prophylaxis: LMWH GI prophylaxis: PPI Lines: n/a Foley:  Yes, and it is still needed  Code Status:  full code Last date of multidisciplinary goals of care discussion: Mother updated at bedside 11/21  This patient is  critically ill with multiple organ system failure which requires frequent high complexity decision making, assessment, support, evaluation, and titration of therapies. This was completed through the application of advanced monitoring  technologies and extensive interpretation of multiple databases. During this encounter critical care time was devoted to patient care services described in this note for 65 minutes.  Julian Hy, DO 03/15/21 9:54 AM [facility]

## 2021-03-15 NOTE — Progress Notes (Signed)
IV nurse at bedside; Levo moved to L arm.

## 2021-03-15 NOTE — Progress Notes (Signed)
I spoke to [facility] physicians regarding neuro storm symptoms. They recommended changing metoprolol to propranolol and adding dantrolene, starting dose 25mg  BID. This can be increased by about 25mg  per dose about every 3 days. Once the dantrolene dose is increased, hopefully we should be able to reduce her benzos. Orders placed.  Julian Hy, DO 03/15/21 1:06 PM [facility]

## 2021-03-15 NOTE — Progress Notes (Signed)
Patient ID: [name], female   DOB: 2000/07/01, 20 y.o.   MRN: [id number]    Progress Note from the Palliative Medicine Team at [facility]   Patient Name: Jamie [name]        Date: 03/15/2021 DOB: February 04, 2001  Age: 20 y.o. MRN#: [id number] Attending Physician: Julian Hy, DO Primary Care Physician: Ok Edwards, MD Admit Date: 02/06/2021   Medical records reviewed   Per intake H&P --> 20 y.o. female with PMHx of Hashimoto thyroiditis  admitted with seizure and change in mental status.  She was treated with steroids and IVIg but despite this her condition worsened.  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  After treatment her mental status worsened and she developed inability to protect her airway in the setting of tachypnea and tachycardia.  She required intubation on 10/28, trach 11/8. Tenuous situation, patient awaits transition to larger tertiary hospital.  [facility] has accepted, waiting for a bed.  This NP visited patient at the bedside as a follow up for palliative medicine needs and emotional support.  Patient is intubated and sedated.  I met with mother/ [place] at the bedside this morning.  I strongly suggested the use of tele-interpretor for clarity and precise communication. Mother tells me that she prefers to use her family and friends to interpret .   I shared with her that it is hospital policy for the medical team to utilize the telemetry interpreter ensuring patient centered care and clear communication.    She agrees if it is necessary.  Plan is for me to return with chaplain tomorrow at 12:30 for ongoing PMT support.  She expresses gratitude for today's brief visit.  Discussed with nursing and shared policy for utilization of interpretors    Questions and concerns addressed   Collaboration with Dr Carlis Abbott CCM  Total time spent on the unit was 35 minutes  Greater than 50% of the time was spent in counseling and coordination of  care  Wadie Lessen NP  Palliative Medicine Team Team Phone # [phone] Pager [phone]

## 2021-03-15 NOTE — Progress Notes (Signed)
Subjective: Continues to have difficult to control dyskinesias and storming  ROS: Unable to obtain due to poor mental status  Examination  Vital signs in last 24 hours: Temp:  [97 F (36.1 C)-99.1 F (37.3 C)] 99 F (37.2 C) (11/21 0800) Pulse Rate:  [80-138] 101 (11/21 1100) Resp:  [18-34] 18 (11/21 1100) BP: (81-182)/(48-145) 99/48 (11/21 1116) SpO2:  [94 %-100 %] 100 % (11/21 1100) FiO2 (%):  [30 %] 30 % (11/21 1116)  General: lying in bed, NAD CVS: pulse-normal rate and rhythm RS: +trach, coarse breath sounds bilaterally Extremities: normal, +edema  Neuro:  Does not open eyes to noxious stimuli, right pupil 3 mm, left pupil 2 mm, +hippus BL,  no apparent facial asymmetry, corneal reflex intact, does not withdraws to noxious stimuli in BL upper extremities, withdrawal vs dyskinesias in BL (L>R) lower extremity  Basic Metabolic Panel: Recent Labs  Lab 03/10/21 0337 03/11/21 0424 03/11/21 0601 03/12/21 0345 03/13/21 0543 03/14/21 1337 03/15/21 0424  NA 138 139 139 137 139 142 141  K 3.6 3.1* 3.1* 4.1 3.3* 3.9 3.4*  CL 103 107 101 101 101 105 106  CO2 28 27 31 27 30 28 27   GLUCOSE 113* 120* 125* 128* 179* 131* 134*  BUN 17 9 10 11 9 11 13   CREATININE 0.43* <0.30* 0.42* 0.35* 0.44 0.42* 0.34*  CALCIUM 8.8* 7.4* 8.6* 8.6* 8.7* 8.8* 8.6*  MG 2.0 1.7 1.9 2.2 2.0  --   --   PHOS  --   --   --  3.0  --   --   --     CBC: Recent Labs  Lab 03/10/21 0337 03/11/21 0424 03/11/21 0601 03/12/21 0345 03/13/21 0543 03/15/21 0424  WBC 8.7 7.2  --  11.0* 12.6* 9.7  NEUTROABS  --   --   --   --   --  7.6  HGB 8.9* 5.3* 8.3* 8.3* 8.5* 8.3*  HCT 29.8* 18.4* 27.5* 27.5* 28.0* 28.4*  MCV 96.1 99.5  --  95.5 94.3 94.4  PLT 436* 236  --  343 386 496*    Coagulation Studies: No results for input(s): LABPROT, INR in the last 72 hours.  Imaging No new brain imaging overnight  ASSESSMENT AND PLAN: 20 y.o. female with PMHx of Hashimoto thyroiditis who presented to the ED 10/15  for evaluation of vomiting, headache, poor p.o. intake for 4 days and decreased verbalization 10/15 prior to arrival. 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  She has since completed 5 days of IV Solu-Medrol, 5 days of IVIG, 5 days of Plex and 1 dose of Rituxan on 03/04/2021.     Summary of work-up so far    Thyroperoxidase antibody 68 Thyroglobulin antibody 57.6 Lumbar puncture on 02/08/2021: 318 WBCs (94 lymphocytes, 0 neutrophils), total protein 50, glucose 68, fungal culture negative, negative HSV 1 and 2 DNA, negative VZV, EBV qualitative positive, CMV CSF negative, cryptococcal antigen negative, NMDA CSF  1:1280,  3 oligoclonal bands, IgG CSF 10.5, IgG/albumin ratio in CSF 0.40, CSF IgG index 1.4, CSF path showed lymphocytosis NMDA IgG  >1:2560, NMDA antibody titer 1:1000 Serum RPR negative, quantifying TB Gold negative EBV DNA by PCR <35 ANA positive dsDNA antibody negative Ribonucleoprotein negative  ENA SN negative Scleroderma antibody negative SSA 1.7 SSB negative  chromatin antibody negative  anti-Jo 1 antibody negative  centromere antibody screen negative JC virus DNA PCR, blood negative Protein electrophoresis, serum showed Albumin-ELP 2.7 (2.9-4.4) and  gammaglobulin 2.6 ( 0.4-1.8) IgG immunoglobulin in serum 2748 ( [phone]) Hepatitis B surface antigen nonreactive Hepatitis B core antibody reactive Hepatitis B post: 856 Hepatitis C Antibody negative EBV VCA IgM normal, IgG >600 ( 0-17.9) and EBV NA IgG >600 (0-17.9) Repeat LP showed 41 wbc ( 96 lymphocytes)   CT Chest Abdo pelvis 02/13/2021: No acute intrathoracic, abdominal, or pelvic pathology. No evidence of malignancy.   US pelvis transabdominal 02/17/2021: Right ovary not visualized. Normal appearance of left ovary and uterus.Trace free fluid in the pelvis.   MR pelvis with and without contrast 02/18/2021: Normal pelvic MRI.   Symmetric normal ovaries. No adnexal masses.   LTM EEG 03/07/2021:   Description: EEG initially showed continuous generalized high amplitude rhythmic 1 to 3 Hz delta slowing with overriding 15 to 18 Hz beta activity consistent with delta brush.   Multiple episodes were captured during which patient had eyebrow twitching, mouth movements as well as bilateral upper extremity twitching.  Concomitant eeg showed continuous generalized high amplitude 1 to 3 Hz-delta slowing with overriding 15-18Hz  fronto-central beta activity ( extreme delta brush).    ABNORMALITY - Delta brush, generalized - Continuous slow, generalized   IMPRESSION: This study is suggestive of moderate to severe diffuse encephalopathy, nonspecific etiology but likely secondary to NMDA encephalitis. No definite seizures or epileptiform discharges were seen during this study.   Multiple episodes were captured during which patient had eyebrow twitching, mouth movements as well as bilateral upper extremity twitching.  Concomitant eeg didn't  show any change to suggest seizure. These episodes are more likely not epileptic. Dyskinesias is in the differential.      NMDA encephalitis Acute encephalopathy, due to NMDA encephalitis and seizures New onset seizures Acute respiratory failure Dyskinesias Right eye mydriasis ( improving) Sympathetic storming -Continues to have difficult to control dyskinesias as well as sympathetic storming  Recommendation- -Discussed case with Dr. Toney Rakes at [facility] on 03/12/2021 and he recommended another round of IV Solu-Medrol.  Discussed with mother at bedside and she would like to wait another day or two before another round of solumedrol.  - I have had extensive discussion with mother about refractory dyskinesias and storming episodes. Also discussed why patient needs to be transferred to tertiary center.  - Ok to resume propofol for storming and dyskinesias -Continue Klonopin 0.5 mg at 0700, 1200,  1700 and continue Klonopin 1 mg nightly, Depakote 500 mg every 8 hours and Keppra 1500 mg twice daily -Also on gabapentin 300 mg 3 times daily, clonidine 0.1 mg twice daily, metoprolol 50 mg twice daily -Plan for second dose of rituximab on 03/20/2021 if patient remains comatose (Of note patient did get 1 dose before Plex as well) -Contacted [facility] to discuss the case and possibly request transfer again today.  Patient has been accepted by neuro ICU but still waiting on bed.  Unfortunately no bed was available today.  Requested to contact again tomorrow. - management of rest per critical care team -Discussed assessment and plan in great detail with mother at bedside   CRITICAL CARE Performed by: Lora Havens   Total critical care time: 50 minutes  Critical care time was exclusive of separately billable procedures and treating other patients.  Critical care was necessary to treat or prevent imminent or life-threatening deterioration.  Critical care was time spent personally by me on the following activities: development of treatment plan with patient and/or surrogate as well as nursing, discussions with consultants, evaluation of patient's response to treatment, examination of patient,  obtaining history from patient or surrogate, ordering and performing treatments and interventions, ordering and review of laboratory studies, ordering and review of radiographic studies, pulse oximetry and re-evaluation of patient's condition.  Zeb Comfort Epilepsy [facility] For questions after 5pm please refer to AMION to reach the Neurologist on call

## 2021-03-16 ENCOUNTER — Other Ambulatory Visit: Payer: Self-pay

## 2021-03-16 ENCOUNTER — Inpatient Hospital Stay (HOSPITAL_COMMUNITY): Payer: Medicaid Other

## 2021-03-16 DIAGNOSIS — J9601 Acute respiratory failure with hypoxia: Secondary | ICD-10-CM | POA: Diagnosis not present

## 2021-03-16 DIAGNOSIS — Z9911 Dependence on respirator [ventilator] status: Secondary | ICD-10-CM | POA: Diagnosis not present

## 2021-03-16 DIAGNOSIS — R579 Shock, unspecified: Secondary | ICD-10-CM | POA: Diagnosis not present

## 2021-03-16 DIAGNOSIS — G0490 Encephalitis and encephalomyelitis, unspecified: Secondary | ICD-10-CM | POA: Diagnosis not present

## 2021-03-16 HISTORY — PX: IR GASTROSTOMY TUBE MOD SED: IMG625

## 2021-03-16 LAB — GLUCOSE, CAPILLARY
Glucose-Capillary: 116 mg/dL — ABNORMAL HIGH (ref 70–99)
Glucose-Capillary: 129 mg/dL — ABNORMAL HIGH (ref 70–99)
Glucose-Capillary: 136 mg/dL — ABNORMAL HIGH (ref 70–99)
Glucose-Capillary: 146 mg/dL — ABNORMAL HIGH (ref 70–99)
Glucose-Capillary: 161 mg/dL — ABNORMAL HIGH (ref 70–99)
Glucose-Capillary: 176 mg/dL — ABNORMAL HIGH (ref 70–99)

## 2021-03-16 LAB — TRIGLYCERIDES: Triglycerides: 235 mg/dL — ABNORMAL HIGH (ref <150)

## 2021-03-16 LAB — BASIC METABOLIC PANEL
Anion gap: 8 (ref 5–15)
BUN: 11 mg/dL (ref 6–20)
CO2: 30 mmol/L (ref 22–32)
Calcium: 9 mg/dL (ref 8.9–10.3)
Chloride: 105 mmol/L (ref 98–111)
Creatinine, Ser: <0.3 mg/dL — ABNORMAL LOW (ref 0.44–1.00)
Glucose, Bld: 112 mg/dL — ABNORMAL HIGH (ref 70–99)
Potassium: 4.4 mmol/L (ref 3.5–5.1)
Sodium: 143 mmol/L (ref 135–145)

## 2021-03-16 LAB — MAGNESIUM: Magnesium: 2.4 mg/dL (ref 1.7–2.4)

## 2021-03-16 MED ORDER — LEVETIRACETAM IN NACL 1500 MG/100ML IV SOLN: 1500.0000 mg | Freq: Two times a day (BID) | INTRAVENOUS | Status: DC

## 2021-03-16 MED ORDER — THIAMINE HCL 100 MG PO TABS: 100.0000 mg | ORAL_TABLET | Freq: Every day | ORAL | Status: DC

## 2021-03-16 MED ORDER — SODIUM CHLORIDE 0.9 % IV SOLN: Freq: Once | INTRAVENOUS | Status: DC

## 2021-03-16 MED ORDER — GABAPENTIN 250 MG/5ML PO SOLN: 300.0000 mg | Freq: Three times a day (TID) | ORAL | 12 refills | Status: DC

## 2021-03-16 MED ORDER — VITAL 1.5 CAL PO LIQD: 1000.0000 mL | ORAL | Status: DC

## 2021-03-16 MED ORDER — ACD FORMULA A 0.73-2.45-2.2 GM/100ML VI SOLN: 1000.0000 mL | Status: DC

## 2021-03-16 MED ORDER — COLCHICINE 0.6 MG PO TABS: 0.6000 mg | ORAL_TABLET | Freq: Every day | ORAL | Status: DC

## 2021-03-16 MED ORDER — GLUCAGON HCL RDNA (DIAGNOSTIC) 1 MG IJ SOLR
INTRAMUSCULAR | Status: AC
  Filled 2021-03-16: qty 1

## 2021-03-16 MED ORDER — LIDOCAINE VISCOUS HCL 2 % MT SOLN
OROMUCOSAL | Status: DC | PRN
  Administered 2021-03-16: 15 mL via ORAL

## 2021-03-16 MED ORDER — PROPOFOL 1000 MG/100ML IV EMUL: 5.0000 ug/kg/min | INTRAVENOUS | Status: DC

## 2021-03-16 MED ORDER — CALCIUM CARBONATE ANTACID 500 MG PO CHEW: 2.0000 | CHEWABLE_TABLET | ORAL | Status: DC

## 2021-03-16 MED ORDER — GLUCAGON HCL (RDNA) 1 MG IJ SOLR
INTRAMUSCULAR | Status: AC | PRN
  Administered 2021-03-16: 1 mg via INTRAVENOUS

## 2021-03-16 MED ORDER — DIPHENHYDRAMINE HCL 25 MG PO CAPS: 25.0000 mg | ORAL_CAPSULE | Freq: Four times a day (QID) | ORAL | Status: DC | PRN

## 2021-03-16 MED ORDER — ATROPINE SULFATE 1 % OP SOLN: 1.0000 [drp] | Freq: Three times a day (TID) | OPHTHALMIC | 12 refills | Status: DC | PRN

## 2021-03-16 MED ORDER — ENOXAPARIN SODIUM 40 MG/0.4ML IJ SOSY: 40.0000 mg | PREFILLED_SYRINGE | INTRAMUSCULAR | Status: AC

## 2021-03-16 MED ORDER — TENOFOVIR DISOPROXIL FUMARATE 300 MG PO TABS: 300.0000 mg | ORAL_TABLET | Freq: Every day | ORAL | Status: AC

## 2021-03-16 MED ORDER — CLONIDINE HCL 0.1 MG PO TABS
0.1000 mg | ORAL_TABLET | Freq: Two times a day (BID) | ORAL | 11 refills | Status: DC
Start: 2021-03-16 — End: 2022-01-05

## 2021-03-16 MED ORDER — NAFCILLIN SODIUM 2 G IJ SOLR: 2.0000 g | INTRAMUSCULAR | Status: DC

## 2021-03-16 MED ORDER — HEPARIN SODIUM (PORCINE) 1000 UNIT/ML IJ SOLN: 1000.0000 [IU] | Freq: Once | INTRAMUSCULAR | Status: DC

## 2021-03-16 MED ORDER — METHYLPREDNISOLONE SODIUM SUCC 1000 MG IJ SOLR
1000.0000 mg | Freq: Every day | INTRAMUSCULAR | Status: DC
Start: 2021-03-17 — End: 2022-01-05

## 2021-03-16 MED ORDER — LEVOTHYROXINE SODIUM 100 MCG PO TABS: 100.0000 ug | ORAL_TABLET | Freq: Every day | ORAL | Status: AC

## 2021-03-16 MED ORDER — FAMOTIDINE IN NACL 20-0.9 MG/50ML-% IV SOLN: 20.0000 mg | Freq: Once | INTRAVENOUS | Status: DC | PRN

## 2021-03-16 MED ORDER — CLONAZEPAM 1 MG PO TBDP
1.0000 mg | ORAL_TABLET | Freq: Every day | ORAL | 0 refills | Status: DC
Start: 2021-03-16 — End: 2022-01-05

## 2021-03-16 MED ORDER — SODIUM CHLORIDE 0.9 % IV SOLN
1000.0000 mg | Freq: Every day | INTRAVENOUS | Status: DC
  Administered 2021-03-16: 1000 mg via INTRAVENOUS
  Filled 2021-03-16 (×2): qty 16

## 2021-03-16 MED ORDER — PROPRANOLOL HCL 20 MG/5ML PO SOLN
10.0000 mg | Freq: Three times a day (TID) | ORAL | Status: DC
  Administered 2021-03-16 (×2): 10 mg
  Filled 2021-03-16 (×4): qty 2.5

## 2021-03-16 MED ORDER — CHLORHEXIDINE GLUCONATE 0.12% ORAL RINSE (MEDLINE KIT)
15.0000 mL | Freq: Two times a day (BID) | OROMUCOSAL | 0 refills | Status: DC
Start: 2021-03-16 — End: 2022-01-05

## 2021-03-16 MED ORDER — CHLORHEXIDINE GLUCONATE CLOTH 2 % EX PADS
6.0000 | MEDICATED_PAD | Freq: Every day | CUTANEOUS | Status: DC
Start: 2021-03-17 — End: 2022-01-05

## 2021-03-16 MED ORDER — LORAZEPAM 2 MG/ML IJ SOLN
2.0000 mg | INTRAMUSCULAR | 0 refills | Status: DC | PRN
Start: 2021-03-16 — End: 2022-01-05

## 2021-03-16 MED ORDER — CLONAZEPAM 1 MG PO TBDP: 1.0000 mg | ORAL_TABLET | Freq: Three times a day (TID) | ORAL | 0 refills | Status: DC

## 2021-03-16 MED ORDER — CALCIUM GLUCONATE-NACL 2-0.675 GM/100ML-% IV SOLN: 2.0000 g | Freq: Once | INTRAVENOUS | Status: DC

## 2021-03-16 MED ORDER — VALPROIC ACID 250 MG/5ML PO SOLN: 500.0000 mg | Freq: Three times a day (TID) | ORAL | Status: DC

## 2021-03-16 MED ORDER — DIPHENHYDRAMINE HCL 50 MG/ML IJ SOLN: 50.0000 mg | Freq: Once | INTRAMUSCULAR | 0 refills | Status: DC | PRN

## 2021-03-16 MED ORDER — ACETAMINOPHEN 325 MG PO TABS: 650.0000 mg | ORAL_TABLET | ORAL | Status: DC | PRN

## 2021-03-16 MED ORDER — LIDOCAINE HCL 1 % IJ SOLN
INTRAMUSCULAR | Status: DC | PRN
  Administered 2021-03-16: 20 mL

## 2021-03-16 MED ORDER — BETHANECHOL CHLORIDE 10 MG PO TABS
10.0000 mg | ORAL_TABLET | Freq: Three times a day (TID) | ORAL | Status: DC
Start: 2021-03-16 — End: 2022-01-05

## 2021-03-16 MED ORDER — ACETAMINOPHEN 160 MG/5ML PO SOLN: 650.0000 mg | ORAL | 0 refills | Status: AC | PRN

## 2021-03-16 MED ORDER — SCOPOLAMINE 1 MG/3DAYS TD PT72: 1.0000 | MEDICATED_PATCH | TRANSDERMAL | 12 refills | Status: AC

## 2021-03-16 MED ORDER — PROPRANOLOL HCL 20 MG/5ML PO SOLN: 10.0000 mg | Freq: Three times a day (TID) | ORAL | 12 refills | Status: DC

## 2021-03-16 MED ORDER — FENTANYL BOLUS VIA INFUSION
50.0000 ug | INTRAVENOUS | 0 refills | Status: DC | PRN
Start: 2021-03-16 — End: 2022-01-05

## 2021-03-16 MED ORDER — ALBUTEROL SULFATE (2.5 MG/3ML) 0.083% IN NEBU: 2.5000 mg | INHALATION_SOLUTION | Freq: Once | RESPIRATORY_TRACT | 12 refills | Status: DC | PRN

## 2021-03-16 MED ORDER — LIDOCAINE VISCOUS HCL 2 % MT SOLN
OROMUCOSAL | Status: AC
  Filled 2021-03-16: qty 15

## 2021-03-16 MED ORDER — PROSOURCE TF PO LIQD: 45.0000 mL | Freq: Three times a day (TID) | ORAL | Status: AC

## 2021-03-16 MED ORDER — SODIUM CHLORIDE 0.9 % IV SOLN: INTRAVENOUS | Status: DC

## 2021-03-16 MED ORDER — MIDAZOLAM HCL 2 MG/2ML IJ SOLN
INTRAMUSCULAR | Status: AC
  Filled 2021-03-16: qty 2

## 2021-03-16 MED ORDER — LIDOCAINE HCL 1 % IJ SOLN
INTRAMUSCULAR | Status: AC
  Filled 2021-03-16: qty 20

## 2021-03-16 MED ORDER — CEFAZOLIN SODIUM-DEXTROSE 2-4 GM/100ML-% IV SOLN
INTRAVENOUS | Status: AC | PRN
  Administered 2021-03-16: 2 g via INTRAVENOUS

## 2021-03-16 MED ORDER — ORAL CARE MOUTH RINSE: 15.0000 mL | Freq: Two times a day (BID) | OROMUCOSAL | 0 refills | Status: AC

## 2021-03-16 MED ORDER — CALCIUM GLUCONATE 10 % IV SOLN: 2.0000 g | Freq: Once | INTRAVENOUS | Status: DC

## 2021-03-16 MED ORDER — ATROPINE SULFATE 1 MG/10ML IJ SOSY
PREFILLED_SYRINGE | INTRAMUSCULAR | Status: AC
  Filled 2021-03-16: qty 10

## 2021-03-16 MED ORDER — NOREPINEPHRINE 4 MG/250ML-% IV SOLN: 2.0000 ug/min | INTRAVENOUS | Status: DC

## 2021-03-16 MED ORDER — CEFAZOLIN SODIUM-DEXTROSE 2-4 GM/100ML-% IV SOLN
INTRAVENOUS | Status: AC
  Filled 2021-03-16: qty 100

## 2021-03-16 MED ORDER — PANTOPRAZOLE SODIUM 40 MG PO PACK: 40.0000 mg | PACK | Freq: Every day | ORAL | Status: DC

## 2021-03-16 MED ORDER — FENTANYL CITRATE (PF) 100 MCG/2ML IJ SOLN
INTRAMUSCULAR | Status: AC
  Filled 2021-03-16: qty 2

## 2021-03-16 MED ORDER — DANTROLENE SODIUM 25 MG PO CAPS: 25.0000 mg | ORAL_CAPSULE | Freq: Two times a day (BID) | ORAL | Status: DC

## 2021-03-16 MED ORDER — FENTANYL 2500MCG IN NS 250ML (10MCG/ML) PREMIX INFUSION: 50.0000 ug/h | INTRAVENOUS | 0 refills | Status: DC

## 2021-03-16 MED ORDER — IOHEXOL 300 MG/ML  SOLN
100.0000 mL | Freq: Once | INTRAMUSCULAR | Status: AC | PRN
  Administered 2021-03-16: 15 mL

## 2021-03-16 NOTE — Discharge Summary (Signed)
Physician Discharge Summary  Patient ID: Jamie [name] MRN: [id number] DOB/AGE: 11-06-2000 20 y.o.  Admit date: 02/06/2021 Discharge date: 03/16/2021  Admission Diagnoses: Acute encephalopathy Hypothyroidism Anxiety and depression AKI AGMA History of vitamin D deficiency   Discharge Diagnoses:  Principal Problem:   Encephalitis Active Problems:   Primary hypothyroidism   Catatonia associated with another mental disorder   Acute respiratory failure with hypoxia (HCC)   Status post tracheostomy ([place]) Acute respiratory failure with hypoxia LLL MSSA pneumonia Hypothyroidism Autoimmune encephalopathy, Anti-NMDA receptor encephalitis Status epilepticus Neuro storm Dyskinesia Bradycardia due to medications Shock due to sedation medications Pericarditis Pericardial effusion Acute anemia of critical illness Urinary retention Status post PEG placement  Discharged Condition: critical  Hospital Course: Ms. Hagwood was managed with assistance from neurology and psychiatry for the first week of her admission on the floor. Following a seizure, she was transferred to the ICU, requiring intubation and aggressive management to suppress seizures and dyskinetic movements. On 10/30 she was started on PLEX. On 11/10 she was started on rituximab after no response to PLEX, IVIG, or pulse dose steroids. She has had prominent neuro storm symptoms with tachycardia, hypertension, dyskinetic movements not associated with EEG abnormalities-- treated with opaites, benzos, Bblockers. Eventually propofol had be to reinitiated, which was titrated off overnight 11/21.  During her admission she was managed on MV, eventually requiring tracheostomy. She has been diagnosed with MSSA pneumonia twice, undergoing ongoing treatment.    Hypothyroidism managed with supplementation  She has been tolerating TF. PEG placed 11/22 by IR.  Consults: cardiology, pulmonary/intensive care, ID, neurology, and  psychiatry  Significant Diagnostic Studies:  Resp culture 11/19> MSSA, pansensitive Trach aspirate MTB RIF NAA- pending 11/12 blood cultures: staph epi 1/4 bottles  11/2 resp culture> MSSA, pansensitive 10/26 blood cx> NG CSF VZV negative CSF culture> NG CSF fungal culture>  NG  02/09/21 NMDA IgG Ab >1:2560 03/09/21 NMDA IgG Ab >1:2560   10/15 TSH 10.095 11/21 TSH  6.925 Free T4 1.6 (low)  Increase thyroperoxidase and thyroglobulin Ab  MRI brain 03/01/21: FINDINGS: Brain: No restricted diffusion to suggest acute infarct. No acute hemorrhage, cerebral edema, mass, mass effect, or midline shift. No abnormal enhancement. Vascular: Normal flow voids. Skull and upper cervical spine: Normal marrow signal. Sinuses/Orbits: Mucosal thickening in the sphenoid sinus, with scattered minor mucosal thickening in the ethmoid air cells. Other: The patient is intubated, with fluid noted in the nasopharynx and oropharynx. Fluid in the left-greater-than-right mastoid air cells. IMPRESSION: No acute intracranial process. No etiology is seen for the patient's blown right pupil.  CT head 11/12:  FINDINGS: Brain: No evidence of acute infarction, hemorrhage, hydrocephalus, extra-axial collection or mass lesion/mass effect. Vascular: No hyperdense vessel or unexpected calcification. Skull: Normal. Negative for fracture or focal lesion. Sinuses/Orbits: No acute finding. IMPRESSION: Negative head CT  Treatments:  PLEX IVIG Rituximab on 11/10, next dose 11/26 Pulse dose steroids-- second dose started 11/22 11/21 started on dantrolene, propranolol for neuro storm. Antibiotics, sedation, pressors, thyroid supplementation, TF  Procedures:  PICC RUE on 11/1 PEG on 11/22 tracheostomy 11/8 LP 10/17, 11/16 RIJ HD catheter   Disposition: transfer to [facility] as of 03/16/2021   No Known Allergies      Medication List     TAKE these medications    acetaminophen 160  MG/5ML solution Commonly known as: TYLENOL Place 20.3 mLs (650 mg total) into feeding tube every 4 (four) hours as needed for fever.   albuterol (2.5 MG/3ML) 0.083% nebulizer solution  Commonly known as: PROVENTIL Take 3 mLs (2.5 mg total) by nebulization once as needed for wheezing or shortness of breath (or anaphylaxis due to Rituxan infusion).   atropine 1 % ophthalmic solution Place 1 drop under the tongue 3 (three) times daily as needed (sialorrhea).   bethanechol 10 MG tablet Commonly known as: URECHOLINE Take 1 tablet (10 mg total) by mouth 3 (three) times daily.   chlorhexidine gluconate (MEDLINE KIT) 0.12 % solution Commonly known as: PERIDEX 15 mLs by Mouth Rinse route 2 (two) times daily.   Chlorhexidine Gluconate Cloth 2 % Pads Apply 6 each topically daily. Start taking on: March 17, 2021   clonazePAM 1 MG disintegrating tablet Commonly known as: KLONOPIN Place 1 tablet (1 mg total) into feeding tube at bedtime.   clonazePAM 1 MG disintegrating tablet Commonly known as: KLONOPIN Place 1 tablet (1 mg total) into feeding tube 3 (three) times daily. Start taking on: March 17, 2021   cloNIDine 0.1 MG tablet Commonly known as: CATAPRES Place 1 tablet (0.1 mg total) into feeding tube 2 (two) times daily.   colchicine 0.6 MG tablet Place 1 tablet (0.6 mg total) into feeding tube daily. Start taking on: March 17, 2021   dantrolene 25 MG capsule Commonly known as: DANTRIUM Take 1 capsule (25 mg total) by mouth 2 (two) times daily.   diphenhydrAMINE 50 MG/ML injection Commonly known as: BENADRYL Inject 1 mL (50 mg total) into the vein once as needed (for cytokine release/hypersensitivity reactions OR anaphylaxis due to Rituxan infusion).   enoxaparin 40 MG/0.4ML injection Commonly known as: LOVENOX Inject 0.4 mLs (40 mg total) into the skin daily. Start taking on: March 17, 2021   famotidine 20-0.9 MG/50ML-% Commonly known as: PEPCID Inject 50 mLs  (20 mg total) into the vein once as needed (for cytokine release/hypersensitivity reactions OR anaphylaxis due to Rituxan infusion).   feeding supplement (PROSource TF) liquid Place 45 mLs into feeding tube 3 (three) times daily.   feeding supplement (VITAL 1.5 CAL) Liqd Place 1,000 mLs into feeding tube continuous.   fentaNYL 10 mcg/ml Soln infusion Inject 50-200 mcg/hr into the vein continuous.   fentaNYL Soln Commonly known as: SUBLIMAZE Inject 50-100 mcg into the vein every 15 (fifteen) minutes as needed (to maintain RASS & CPOT goal.).   gabapentin 250 MG/5ML solution Commonly known as: NEURONTIN Place 6 mLs (300 mg total) into feeding tube every 8 (eight) hours.   levETIRacetam 1500 MG/100ML Soln Commonly known as: KEPPRA Inject 100 mLs (1,500 mg total) into the vein every 12 (twelve) hours.   levothyroxine 100 MCG tablet Commonly known as: SYNTHROID Place 1 tablet (100 mcg total) into feeding tube daily at 6 (six) AM. Start taking on: March 17, 2021 What changed:  how to take this when to take this   LORazepam 2 MG/ML injection Commonly known as: ATIVAN Inject 1 mL (2 mg total) into the vein every 4 (four) hours as needed (Dyskinesias).   methylPREDNISolone sodium succinate 1,000 mg in sodium chloride 0.9 % 50 mL Inject 1,000 mg into the vein daily. Start taking on: March 17, 2021   mouth rinse Liqd solution 15 mLs by Mouth Rinse route 2 (two) times daily.   nafcillin 2 g in sodium chloride 0.9 % 100 mL Inject 2 g into the vein every 4 (four) hours.   norepinephrine 4-5 MG/250ML-% Soln Commonly known as: LEVOPHED Inject 2-10 mcg/min into the vein continuous.   pantoprazole sodium 40 mg Commonly known as: PROTONIX Place 40 mg  into feeding tube at bedtime.   propofol 1000 MG/100ML Emul injection Commonly known as: DIPRIVAN Inject 272-4,352 mcg/min into the vein continuous.   propranolol 20 MG/5ML solution Commonly known as: INDERAL Place 2.5 mLs  (10 mg total) into feeding tube 3 (three) times daily.   scopolamine 1 MG/3DAYS Commonly known as: TRANSDERM-SCOP Place 1 patch (1.5 mg total) onto the skin every 3 (three) days. Start taking on: March 18, 2021   tenofovir 300 MG tablet Commonly known as: VIREAD Place 1 tablet (300 mg total) into feeding tube daily. Start taking on: March 17, 2021   thiamine 100 MG tablet Place 1 tablet (100 mg total) into feeding tube daily. Start taking on: March 17, 2021   valproic acid 250 MG/5ML solution Commonly known as: DEPAKENE Place 10 mLs (500 mg total) into feeding tube every 8 (eight) hours.         Signed: Julian Hy 03/16/2021, 6:37 PM

## 2021-03-16 NOTE — Progress Notes (Signed)
Patient transported to IR and back without complications. RN at bedside. ?

## 2021-03-16 NOTE — Progress Notes (Signed)
NAME:  Jamie [name], MRN:  [id number], DOB:  Jun 01, 2000, LOS: 53 ADMISSION DATE:  02/06/2021, CONSULTATION DATE:  10/28 REFERRING MD:  Raymondo Band, CHIEF COMPLAINT:  Seizure, acute metabolic encephalopathy   History of Present Illness:  20 y/o female admitted with seizure and change in mental status.  She was treated with steroids and IVIg but despite this her condition worsened.  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  After treatment her mental status worsened and she developed inability to protect her airway in the setting of tachypnea and tachycardia.  She required intubation on 10/28, trach 11/8.   Pertinent  Medical History  Acquired autoimmune hypothyroidism  Significant Hospital Events: Including procedures, antibiotic start and stop dates in addition to other pertinent events   10/16 Admit 10/25 New onset seizure activity, PCCM consult 1026 pulmonary critical care signed off 02/19/2021 pulmonary critical care reconsulted for suspected respiratory distress.  intubated, continuous EEG started 10/30 starting plasmapheresis 10/31 MRI normal, EEG with GPD+ 11/1 RN reports pt seized while in MRI , additional agents added overnight.   11/5 completed 4th session of PLEX 11/7 due for 5/5 PLEX. Afternoon R blown pupil. No etiology on imaging.  11/8 trach 11/12 Rhythmic movements concerned for seizures, fever 102. Neuro evaluated for dyskinesia 11/13 Overnight continued to have dyskinetic movements associated with tachycardia. EEG with no seizure. 11/16 LP with 41 WBC 96 lymphocytes   Micro/labs: 10/16 influenza AB PCR negative, SARS-CoV-2 PCR negative 10/16 HIV antibody negative 10/16 urine culture multiple species suggest recollection 10/16 urine drug screen negative, alcohol less than 10 10/17 CSF lymphocytic pleocytosis, fungal culture negative, CSF culture negative 10/17 CSF HSV 1 DNA negative, HSV-2 DNA negative, VZV PCR CSF negative 10/17 CSF cryptococcal  antigen negative, cryptococcal antigen titer not indicated 10/17 CSF EBV positive  10/17 CMV qualitative CSF negative 10/17 NMDA IgG on CSF > 1:2560 10/18 HIV RNA quant less than 20 10/20 NMDA IgG serum: positive 1:1000 10/22 RPR nonreactive 10/22 QuantiFERON gold intermediate 10/23 EBV DNA QuantiFERON by PCR positive, less than 35 10/25 hepatitis B surface antigen nonreactive, hepatitis B core total antibody positive, hepatitis B antibody positive, hepatitis B DNA not detected 10/25 hepatitis C antibody negative 10/26 blood culture no growth to date 11/2 Tracheal aspirate abundant MSSA  11/19 Few gram positive cocci  Imaging: 10/16 CT head negative 10/16 MRI brain incomplete study due to patient intolerance without evidence of acute intracranial abnormality 10/22 chest abdomen pelvis CT scan no acute intrathoracic abdominal or pelvic pathology, no evidence of malignancy 10/22 MRI brain normal MRI of the brain 10/26 pelvic ultrasound right ovary not visualized, normal appearance of left ovary and uterus, trace free fluid in pelvis 10/27 pelvic MRI: Normal pelvic MRI, symmetric normal ovaries, no adnexal masses 10/31 MRI > no acute infarct or hemorrhage. Normal MRI.  11/7 CT H no acute abnormality 11/7 MRI brain no acute process  11/13 EEG suggestive of moderate to severe diffuse encephalopathy, likely related to NMDA encephalitis. No seizures or epileptiform discharges  11/14 Doppler ultrasound lower extremities negative for DVT  Procedures: Trach 11/8 >>  R IJ HD catheter 10/29 >>  RUE PICC 11/1 >> Trach 11/8 LP 11/16 > WBC 41 Lymphs 96  Interim History / Subjective:  Planning for PEG today. Bradycardic overnight after switching to propranolol and starting propofol. Switched off propofol to fentanyl infusion.   Objective   Blood pressure 121/88, pulse (!) 43, temperature 97.7 F (36.5 C), temperature source Oral, resp. rate 18, height  5\' 2"  (1.575 m), weight 54.4 kg, last  menstrual period 01/11/2021, SpO2 100 %.    Vent Mode: PRVC FiO2 (%):  [30 %] 30 % Set Rate:  [18 bmp] 18 bmp Vt Set:  [400 mL] 400 mL PEEP:  [5 cmH20] 5 cmH20 Pressure Support:  [14 cmH20] 14 cmH20 Plateau Pressure:  [15 cmH20-18 cmH20] 18 cmH20   Intake/Output Summary (Last 24 hours) at 03/16/2021 0709 Last data filed at 03/16/2021 0600 Gross per 24 hour  Intake 2332.62 ml  Output 2500 ml  Net -167.38 ml    Filed Weights   03/06/21 0146 03/07/21 0359 03/10/21 0400  Weight: 56.2 kg 56.1 kg 54.4 kg   Physical Exam: General: critically ill appearing woman lying in bed trached, sedated on fentanyl.  HEENT:  [place]/AT, eyes anicteric   Neuro:  RASS -5, reactive pupils, anisicoria. Withdrew R hand slightly to nailbed pressure, not reliably withdrawing other extremities. Some nonpurposeful movement of her lips. No rhythmic movements. CV: bradycardic, reg rhythm PULM: CTAB, synchronous with MV, significantly less tracheal secretions GI: soft, NT, ND Extremities: no cyanosis or edema Skin: warm, dry, no rashes.    Assessment & Plan:   Acute hypoxemic respiratory failure requiring MV Status post tracheostomy LLL pneumonia- MSSA, recurrent -Con't nafcillin to complete 7 days -LTVV, 4-8cc/kg IBW with goal Pplat<30 and DP<15 -VAP prevention protocol -PAD protocol for sedation; not needing sedation so much as meds for neuro storm  Anti-NMDA receptor encephalitis Status epilepticus, improved Dyskinesia- severe, a/w neuro storming episodes -S/p 5 days of IV Solu-Medrol, 5 days of IVIG, 5 days of Plex. -considered redosing with steroids starting 11/21, mother was in favor of waiting -1 day of Rituxan given 11/10; next dose scheduled this week on 11/26 -MRI pelvis negative for ovarian mass/teratoma -Trial off fentanyl infusion. Having to hold propranolol and clonazepam due to PEG, but giving dantrolene enterally this morning -con't clonazepam 1mg  4 times daily -con't keppra,  gabapentin, valproate  Bradycardia due to Bblockers -ok to hold propranolol this morning; will decrease dose to 10mg  TID -fentanyl paused for neuro assessment for now  Shock due to sedating meds -noreip to maintain MAP>65; may need PICC replaced if she continues to require continuous infusion for sedation -hold clonidine  Neuro storming episodes- whole body tonic clonic movements, tachypnea, tachycardia, hypertension -Ativan, fentanyl, morphine PRN  -Propranolol > decrease dose & dantrolene per PMR's recommendations. Can increase dantrolene by about 25mg  every 3 days.  -recheck TSH--improved, no evidence of hyperthyroidism  Pericarditis Pericardial effusion> better on 11/11 echo -Con't colchicine -tele monitoring  Acute anemia critical illness -transfuse for Hb<7 or hemodynamically significant bleeding -monitor  Hashimoto's thyroiditis -con't synthroid -recheck TSH  Urinary retention -con't bethanechol -can try foley removal once cleared to get enteral meds after PEG  At risk for malnutrition -PEG today -con't TF once cleared by [facility] has accepted for transfer, but they do not have a waiting list and calls have to be made daily. Dr. Hortense Ramal called back yesterday. -Patient was accepted pending bed availability; Dr. Hortense Ramal to call them back again today.  Appreciate Palliative Care team's input to help support the family. Continuing all aggressive care. No family at bedside this morning.  Best Practice (right click and "Reselect all SmartList Selections" daily)  Diet/type: tubefeeds DVT prophylaxis: LMWH GI prophylaxis: PPI Lines: n/a Foley:  Yes, and it is still needed - urinary retention Code Status:  full code Last date of multidisciplinary goals of care discussion: Mother updated at bedside  11/21  This patient is critically ill with multiple organ system failure which requires frequent high complexity decision making, assessment, support, evaluation, and  titration of therapies. This was completed through the application of advanced monitoring technologies and extensive interpretation of multiple databases. During this encounter critical care time was devoted to patient care services described in this note for 40 minutes.  Julian Hy, DO 03/16/21 7:47 AM [facility]

## 2021-03-16 NOTE — Progress Notes (Addendum)
   03/16/21 1230  Clinical Encounter Type  Visited With Patient;Health care provider  Visit Type Follow-up  Referral From Palliative care team  Consult/Referral To Nurse   Gem attended the consultation meeting with the [place], to support the patient's mother, Ms. Martinique. Mary provided reasons for the consultation to the attending nurse and RN, Jarrett Soho. Ms. Martinique was not present; I will follow up tomorrow.Ike Bene prayed silently at bedside. This note was prepared by Jeanine Luz, M.Div..  For questions please contact by phone [phone].

## 2021-03-16 NOTE — Procedures (Signed)
Interventional Radiology Procedure Note  Procedure: Percutaneous G tube placement  Indication: Failure to thrive.   Findings: Please refer to procedural dictation for full description.  Complications: None  EBL: < 10 mL  Miachel Roux, MD [phone]

## 2021-03-16 NOTE — Progress Notes (Signed)
Tube feeds d/c at midnight

## 2021-03-16 NOTE — Sedation Documentation (Signed)
No sedation medication given by IR nurse.

## 2021-03-16 NOTE — Progress Notes (Signed)
0510: Pt had episode of TD with a SBP in the 80's. CCM  and Neuro notified.  0515: morphine given; no relief  Neuro advised to give bolus and start Fentanyl drip when BP stable.  4299: SBP 120's; no TD like activity; Fentanyl at 100.

## 2021-03-16 NOTE — Progress Notes (Addendum)
[facility] Progress Note Patient Name: Jamie [name] DOB: Mar 22, 2001 MRN: [id number]   Date of Service  03/16/2021  HPI/Events of Note  Bradycardia to 9s. Stable BP.   eICU Interventions  DC propofol Telemetry  5:34 AM HR decreased to 30s and SBP 80s Neuro advised increased Levophed     Intervention Category Intermediate Interventions: Arrhythmia - evaluation and management  Stewart Sasaki Rodman Pickle 03/16/2021, 2:24 AM

## 2021-03-16 NOTE — Progress Notes (Signed)
Subjective: Well started on propofol yesterday due to persistent elevated blood pressure, tachycardia, dyskinesias.  Present at bedside today.  ROS: Unable to obtain due to poor mental status  Examination  Vital signs in last 24 hours: Temp:  [97 F (36.1 C)-100.1 F (37.8 C)] 97.5 F (36.4 C) (11/22 1200) Pulse Rate:  [40-108] 77 (11/22 1500) Resp:  [18-25] 18 (11/22 1500) BP: (78-152)/(35-117) 125/64 (11/22 1500) SpO2:  [94 %-100 %] 100 % (11/22 1500) FiO2 (%):  [30 %] 30 % (11/22 1437)  General: lying in bed, NAD CVS: pulse-normal rate and rhythm RS: +trach, coarse breath sounds bilaterally Extremities: normal, +edema  Neuro:  Does not open eyes to noxious stimuli, right pupil 3 mm, left pupil 2 mm, both pupils reacting to light,  no apparent facial asymmetry, corneal reflex intact, withdraws to noxious stimuli in all 4 extremities with dyskinesias  Basic Metabolic Panel: Recent Labs  Lab 03/11/21 0424 03/11/21 0601 03/12/21 0345 03/13/21 0543 03/14/21 1337 03/15/21 0424 03/16/21 0300  NA 139 139 137 139 142 141 143  K 3.1* 3.1* 4.1 3.3* 3.9 3.4* 4.4  CL 107 101 101 101 105 106 105  CO2 27 31 27 30 28 27 30   GLUCOSE 120* 125* 128* 179* 131* 134* 112*  BUN 9 10 11 9 11 13 11   CREATININE <0.30* 0.42* 0.35* 0.44 0.42* 0.34* <0.30*  CALCIUM 7.4* 8.6* 8.6* 8.7* 8.8* 8.6* 9.0  MG 1.7 1.9 2.2 2.0  --   --  2.4  PHOS  --   --  3.0  --   --   --   --     CBC: Recent Labs  Lab 03/10/21 0337 03/11/21 0424 03/11/21 0601 03/12/21 0345 03/13/21 0543 03/15/21 0424  WBC 8.7 7.2  --  11.0* 12.6* 9.7  NEUTROABS  --   --   --   --   --  7.6  HGB 8.9* 5.3* 8.3* 8.3* 8.5* 8.3*  HCT 29.8* 18.4* 27.5* 27.5* 28.0* 28.4*  MCV 96.1 99.5  --  95.5 94.3 94.4  PLT 436* 236  --  343 386 496*     Coagulation Studies: No results for input(s): LABPROT, INR in the last 72 hours.  Imaging No new brain imaging overnight   ASSESSMENT AND PLAN: 20 y.o. female with PMHx of Hashimoto  thyroiditis who presented to the ED 10/15 for evaluation of vomiting, headache, poor p.o. intake for 4 days and decreased verbalization 10/15 prior to arrival. 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  She has since completed 5 days of IV Solu-Medrol, 5 days of IVIG, 5 days of Plex and 1 dose of Rituxan on 03/04/2021.     Summary of work-up so far    Thyroperoxidase antibody 68 Thyroglobulin antibody 57.6 Lumbar puncture on 02/08/2021: 318 WBCs (94 lymphocytes, 0 neutrophils), total protein 50, glucose 68, fungal culture negative, negative HSV 1 and 2 DNA, negative VZV, EBV qualitative positive, CMV CSF negative, cryptococcal antigen negative, NMDA CSF  1:1280,  3 oligoclonal bands, IgG CSF 10.5, IgG/albumin ratio in CSF 0.40, CSF IgG index 1.4, CSF path showed lymphocytosis NMDA IgG  >1:2560, NMDA antibody titer 1:1000 Serum RPR negative, quantifying TB Gold negative EBV DNA by PCR <35 ANA positive dsDNA antibody negative Ribonucleoprotein negative  ENA SN negative Scleroderma antibody negative SSA 1.7 SSB negative  chromatin antibody negative  anti-Jo 1 antibody negative  centromere antibody screen negative JC virus DNA PCR, blood negative Protein electrophoresis,  serum showed Albumin-ELP 2.7 (2.9-4.4) and gammaglobulin 2.6 ( 0.4-1.8) IgG immunoglobulin in serum 2748 ( 119-4174) Hepatitis B surface antigen nonreactive Hepatitis B core antibody reactive Hepatitis B post: 856 Hepatitis C Antibody negative EBV VCA IgM normal, IgG >600 ( 0-17.9) and EBV NA IgG >600 (0-17.9) Repeat LP showed 41 wbc ( 96 lymphocytes)   CT Chest Abdo pelvis 02/13/2021: No acute intrathoracic, abdominal, or pelvic pathology. No evidence of malignancy.   US pelvis transabdominal 02/17/2021: Right ovary not visualized. Normal appearance of left ovary and uterus.Trace free fluid in the pelvis.   MR pelvis with and without  contrast 02/18/2021: Normal pelvic MRI.  Symmetric normal ovaries. No adnexal masses.   LTM EEG 03/07/2021:   Description: EEG initially showed continuous generalized high amplitude rhythmic 1 to 3 Hz delta slowing with overriding 15 to 18 Hz beta activity consistent with delta brush.   Multiple episodes were captured during which patient had eyebrow twitching, mouth movements as well as bilateral upper extremity twitching.  Concomitant eeg showed continuous generalized high amplitude 1 to 3 Hz-delta slowing with overriding 15-18Hz  fronto-central beta activity ( extreme delta brush).    ABNORMALITY - Delta brush, generalized - Continuous slow, generalized   IMPRESSION: This study is suggestive of moderate to severe diffuse encephalopathy, nonspecific etiology but likely secondary to NMDA encephalitis. No definite seizures or epileptiform discharges were seen during this study.   Multiple episodes were captured during which patient had eyebrow twitching, mouth movements as well as bilateral upper extremity twitching.  Concomitant eeg didn't  show any change to suggest seizure. These episodes are more likely not epileptic. Dyskinesias is in the differential.      NMDA encephalitis Acute encephalopathy, due to NMDA encephalitis and seizures New onset seizures Acute respiratory failure Dyskinesias Right eye mydriasis ( improving) Sympathetic storming -Continues to be comatose with difficult to control dyskinesias as well as sympathetic storming  Recommendation- -Discussed case with Dr. Toney Rakes at [facility] on 03/12/2021 and he recommended another round of IV Solu-Medrol.  Mother wanted to wait yesterday but is agreeable to start IV Solu-Medrol again today D1/5 -Continue Klonopin 0.5 mg at 0700, 1200, 1700 and continue Klonopin 1 mg nightly, Depakote 500 mg every 8 hours and Keppra 1500 mg twice daily -Also on gabapentin 300 mg 3 times daily, clonidine 0.1 mg twice daily, metoprolol 50 mg twice  daily -Plan for second dose of rituximab on 03/20/2021 if patient remains comatose (Of note patient did get 1 dose before Plex as well) -Contacted [facility] and [facility] to discuss the case and possibly request transfer again today.  Patient has been accepted by neuro ICU at both [facility] but still waiting on bed.  Unfortunately no bed was available today.  Both hospitals requested to contact again tomorrow. - Will also called [facility] for transfer - management of rest per critical care team -Discussed assessment and plan in great detail with mother at bedside   CRITICAL CARE Performed by: Lora Havens     Total critical care time: 35 minutes   Critical care time was exclusive of separately billable procedures and treating other patients.   Critical care was necessary to treat or prevent imminent or life-threatening deterioration.   Critical care was time spent personally by me on the following activities: development of treatment plan with patient and/or surrogate as well as nursing, discussions with consultants, evaluation of patient's response to treatment, examination of patient, obtaining history from patient or surrogate, ordering and performing treatments and  interventions, ordering and review of laboratory studies, ordering and review of radiographic studies, pulse oximetry and re-evaluation of patient's condition.   [facility] For questions after 5pm please refer to AMION to reach the Neurologist on call

## 2021-03-16 NOTE — Progress Notes (Signed)
The patient's mother indicated that she required another letter for her daughter indicating symptoms, diagnosis, date of hospitalization, and expected time of disability moving forward. I have provided the following letter to her, as requested.   [facility] [address] [place], [place] [address] [facility]:  [phone]  March 16, 2021   Cherylyn Rambert [address] [place] [address]  To whom it may concern,  Ms. Chiarelli has been admitted to the hospital since 02/08/2021 and has been under the care of [facility]'s team since 02/16/2021. 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 She requires ongoing mechanical ventilation and other supportive care to support her through her treatments. We anticipate at least another 2-3 months of illness and recovery, possibly longer.  Sincerely,  L. Ernest Mallick, DO

## 2021-03-17 LAB — MISC LABCORP TEST (SEND OUT)
Labcorp test code: 505375
Labcorp test code: 9985

## 2021-03-17 NOTE — Care Plan (Signed)
Patient was transferred to [facility] yesterday evening.  Patient's mother [place] was notified by me.  Serum NMDA antibody titer came back today, still 1:1000.   Calia Napp [name]

## 2021-03-18 DIAGNOSIS — J9610 Chronic respiratory failure, unspecified whether with hypoxia or hypercapnia: Secondary | ICD-10-CM | POA: Diagnosis not present

## 2021-03-18 DIAGNOSIS — A4101 Sepsis due to Methicillin susceptible Staphylococcus aureus: Secondary | ICD-10-CM | POA: Diagnosis not present

## 2021-03-18 DIAGNOSIS — G0481 Other encephalitis and encephalomyelitis: Secondary | ICD-10-CM | POA: Diagnosis not present

## 2021-03-18 DIAGNOSIS — Z930 Tracheostomy status: Secondary | ICD-10-CM | POA: Diagnosis not present

## 2021-03-18 DIAGNOSIS — J189 Pneumonia, unspecified organism: Secondary | ICD-10-CM | POA: Diagnosis not present

## 2021-03-18 DIAGNOSIS — D649 Anemia, unspecified: Secondary | ICD-10-CM | POA: Diagnosis not present

## 2021-03-18 DIAGNOSIS — D72829 Elevated white blood cell count, unspecified: Secondary | ICD-10-CM | POA: Diagnosis not present

## 2021-03-19 DIAGNOSIS — R918 Other nonspecific abnormal finding of lung field: Secondary | ICD-10-CM | POA: Diagnosis not present

## 2021-03-19 DIAGNOSIS — R739 Hyperglycemia, unspecified: Secondary | ICD-10-CM | POA: Diagnosis not present

## 2021-03-19 DIAGNOSIS — B9561 Methicillin susceptible Staphylococcus aureus infection as the cause of diseases classified elsewhere: Secondary | ICD-10-CM | POA: Diagnosis not present

## 2021-03-19 DIAGNOSIS — D72829 Elevated white blood cell count, unspecified: Secondary | ICD-10-CM | POA: Diagnosis not present

## 2021-03-19 DIAGNOSIS — T380X5A Adverse effect of glucocorticoids and synthetic analogues, initial encounter: Secondary | ICD-10-CM | POA: Diagnosis not present

## 2021-03-19 DIAGNOSIS — G0481 Other encephalitis and encephalomyelitis: Secondary | ICD-10-CM | POA: Diagnosis not present

## 2021-03-19 DIAGNOSIS — G249 Dystonia, unspecified: Secondary | ICD-10-CM | POA: Diagnosis not present

## 2021-03-19 DIAGNOSIS — Z930 Tracheostomy status: Secondary | ICD-10-CM | POA: Diagnosis not present

## 2021-03-19 DIAGNOSIS — R1310 Dysphagia, unspecified: Secondary | ICD-10-CM | POA: Diagnosis not present

## 2021-03-19 DIAGNOSIS — D649 Anemia, unspecified: Secondary | ICD-10-CM | POA: Diagnosis not present

## 2021-03-19 DIAGNOSIS — D509 Iron deficiency anemia, unspecified: Secondary | ICD-10-CM | POA: Diagnosis not present

## 2021-03-19 DIAGNOSIS — Y92239 Unspecified place in hospital as the place of occurrence of the external cause: Secondary | ICD-10-CM | POA: Diagnosis not present

## 2021-03-19 DIAGNOSIS — J9610 Chronic respiratory failure, unspecified whether with hypoxia or hypercapnia: Secondary | ICD-10-CM | POA: Diagnosis not present

## 2021-03-19 DIAGNOSIS — R509 Fever, unspecified: Secondary | ICD-10-CM | POA: Diagnosis not present

## 2021-03-19 DIAGNOSIS — J189 Pneumonia, unspecified organism: Secondary | ICD-10-CM | POA: Diagnosis not present

## 2021-03-19 DIAGNOSIS — Z452 Encounter for adjustment and management of vascular access device: Secondary | ICD-10-CM | POA: Diagnosis not present

## 2021-03-20 DIAGNOSIS — F05 Delirium due to known physiological condition: Secondary | ICD-10-CM | POA: Diagnosis not present

## 2021-03-20 DIAGNOSIS — Z930 Tracheostomy status: Secondary | ICD-10-CM | POA: Diagnosis not present

## 2021-03-20 DIAGNOSIS — R1310 Dysphagia, unspecified: Secondary | ICD-10-CM | POA: Diagnosis not present

## 2021-03-20 DIAGNOSIS — J9611 Chronic respiratory failure with hypoxia: Secondary | ICD-10-CM | POA: Diagnosis not present

## 2021-03-20 DIAGNOSIS — G0481 Other encephalitis and encephalomyelitis: Secondary | ICD-10-CM | POA: Diagnosis not present

## 2021-03-20 DIAGNOSIS — D509 Iron deficiency anemia, unspecified: Secondary | ICD-10-CM | POA: Diagnosis not present

## 2021-03-20 DIAGNOSIS — R509 Fever, unspecified: Secondary | ICD-10-CM | POA: Diagnosis not present

## 2021-03-20 DIAGNOSIS — B9561 Methicillin susceptible Staphylococcus aureus infection as the cause of diseases classified elsewhere: Secondary | ICD-10-CM | POA: Diagnosis not present

## 2021-03-20 DIAGNOSIS — J189 Pneumonia, unspecified organism: Secondary | ICD-10-CM | POA: Diagnosis not present

## 2021-03-20 DIAGNOSIS — J9610 Chronic respiratory failure, unspecified whether with hypoxia or hypercapnia: Secondary | ICD-10-CM | POA: Diagnosis not present

## 2021-03-21 DIAGNOSIS — D509 Iron deficiency anemia, unspecified: Secondary | ICD-10-CM | POA: Diagnosis not present

## 2021-03-21 DIAGNOSIS — R1310 Dysphagia, unspecified: Secondary | ICD-10-CM | POA: Diagnosis not present

## 2021-03-21 DIAGNOSIS — J9610 Chronic respiratory failure, unspecified whether with hypoxia or hypercapnia: Secondary | ICD-10-CM | POA: Diagnosis not present

## 2021-03-21 DIAGNOSIS — Z930 Tracheostomy status: Secondary | ICD-10-CM | POA: Diagnosis not present

## 2021-03-21 DIAGNOSIS — J189 Pneumonia, unspecified organism: Secondary | ICD-10-CM | POA: Diagnosis not present

## 2021-03-21 DIAGNOSIS — G0481 Other encephalitis and encephalomyelitis: Secondary | ICD-10-CM | POA: Diagnosis not present

## 2021-03-21 DIAGNOSIS — B9561 Methicillin susceptible Staphylococcus aureus infection as the cause of diseases classified elsewhere: Secondary | ICD-10-CM | POA: Diagnosis not present

## 2021-03-22 DIAGNOSIS — B9561 Methicillin susceptible Staphylococcus aureus infection as the cause of diseases classified elsewhere: Secondary | ICD-10-CM | POA: Diagnosis not present

## 2021-03-22 DIAGNOSIS — Z930 Tracheostomy status: Secondary | ICD-10-CM | POA: Diagnosis not present

## 2021-03-22 DIAGNOSIS — R569 Unspecified convulsions: Secondary | ICD-10-CM | POA: Diagnosis not present

## 2021-03-22 DIAGNOSIS — J189 Pneumonia, unspecified organism: Secondary | ICD-10-CM | POA: Diagnosis not present

## 2021-03-22 DIAGNOSIS — R1310 Dysphagia, unspecified: Secondary | ICD-10-CM | POA: Diagnosis not present

## 2021-03-22 DIAGNOSIS — J9690 Respiratory failure, unspecified, unspecified whether with hypoxia or hypercapnia: Secondary | ICD-10-CM | POA: Diagnosis not present

## 2021-03-22 DIAGNOSIS — D509 Iron deficiency anemia, unspecified: Secondary | ICD-10-CM | POA: Diagnosis not present

## 2021-03-22 DIAGNOSIS — R451 Restlessness and agitation: Secondary | ICD-10-CM | POA: Diagnosis not present

## 2021-03-22 DIAGNOSIS — G0481 Other encephalitis and encephalomyelitis: Secondary | ICD-10-CM | POA: Diagnosis not present

## 2021-03-22 DIAGNOSIS — J9610 Chronic respiratory failure, unspecified whether with hypoxia or hypercapnia: Secondary | ICD-10-CM | POA: Diagnosis not present

## 2021-03-23 DIAGNOSIS — J15211 Pneumonia due to Methicillin susceptible Staphylococcus aureus: Secondary | ICD-10-CM | POA: Diagnosis not present

## 2021-03-23 DIAGNOSIS — J9691 Respiratory failure, unspecified with hypoxia: Secondary | ICD-10-CM | POA: Diagnosis not present

## 2021-03-23 DIAGNOSIS — G0481 Other encephalitis and encephalomyelitis: Secondary | ICD-10-CM | POA: Diagnosis not present

## 2021-03-23 DIAGNOSIS — G249 Dystonia, unspecified: Secondary | ICD-10-CM | POA: Diagnosis not present

## 2021-03-23 DIAGNOSIS — J189 Pneumonia, unspecified organism: Secondary | ICD-10-CM | POA: Diagnosis not present

## 2021-03-23 DIAGNOSIS — G901 Familial dysautonomia [Riley-Day]: Secondary | ICD-10-CM | POA: Diagnosis not present

## 2021-03-23 DIAGNOSIS — J9611 Chronic respiratory failure with hypoxia: Secondary | ICD-10-CM | POA: Diagnosis not present

## 2021-03-23 DIAGNOSIS — I3139 Other pericardial effusion (noninflammatory): Secondary | ICD-10-CM | POA: Diagnosis not present

## 2021-03-23 DIAGNOSIS — D509 Iron deficiency anemia, unspecified: Secondary | ICD-10-CM | POA: Diagnosis not present

## 2021-03-23 DIAGNOSIS — K759 Inflammatory liver disease, unspecified: Secondary | ICD-10-CM | POA: Diagnosis not present

## 2021-03-23 DIAGNOSIS — Z992 Dependence on renal dialysis: Secondary | ICD-10-CM | POA: Diagnosis not present

## 2021-03-23 DIAGNOSIS — B1910 Unspecified viral hepatitis B without hepatic coma: Secondary | ICD-10-CM | POA: Diagnosis not present

## 2021-03-23 DIAGNOSIS — R509 Fever, unspecified: Secondary | ICD-10-CM | POA: Diagnosis not present

## 2021-03-23 DIAGNOSIS — A4101 Sepsis due to Methicillin susceptible Staphylococcus aureus: Secondary | ICD-10-CM | POA: Diagnosis not present

## 2021-03-23 DIAGNOSIS — R1319 Other dysphagia: Secondary | ICD-10-CM | POA: Diagnosis not present

## 2021-03-23 DIAGNOSIS — J9610 Chronic respiratory failure, unspecified whether with hypoxia or hypercapnia: Secondary | ICD-10-CM | POA: Diagnosis not present

## 2021-03-23 DIAGNOSIS — D72829 Elevated white blood cell count, unspecified: Secondary | ICD-10-CM | POA: Diagnosis not present

## 2021-03-23 DIAGNOSIS — E063 Autoimmune thyroiditis: Secondary | ICD-10-CM | POA: Diagnosis not present

## 2021-03-23 NOTE — Care Plan (Signed)
CSF NMDA titer 1:1280. Same as before. Patient already at [facility]

## 2021-03-24 DIAGNOSIS — J9610 Chronic respiratory failure, unspecified whether with hypoxia or hypercapnia: Secondary | ICD-10-CM | POA: Diagnosis not present

## 2021-03-24 DIAGNOSIS — R739 Hyperglycemia, unspecified: Secondary | ICD-10-CM | POA: Diagnosis not present

## 2021-03-24 DIAGNOSIS — R1310 Dysphagia, unspecified: Secondary | ICD-10-CM | POA: Diagnosis not present

## 2021-03-24 DIAGNOSIS — J15211 Pneumonia due to Methicillin susceptible Staphylococcus aureus: Secondary | ICD-10-CM | POA: Diagnosis not present

## 2021-03-24 DIAGNOSIS — D509 Iron deficiency anemia, unspecified: Secondary | ICD-10-CM | POA: Diagnosis not present

## 2021-03-24 DIAGNOSIS — Z930 Tracheostomy status: Secondary | ICD-10-CM | POA: Diagnosis not present

## 2021-03-24 DIAGNOSIS — J155 Pneumonia due to Escherichia coli: Secondary | ICD-10-CM | POA: Diagnosis not present

## 2021-03-24 DIAGNOSIS — D72829 Elevated white blood cell count, unspecified: Secondary | ICD-10-CM | POA: Diagnosis not present

## 2021-03-24 DIAGNOSIS — B9620 Unspecified Escherichia coli [E. coli] as the cause of diseases classified elsewhere: Secondary | ICD-10-CM | POA: Diagnosis not present

## 2021-03-24 DIAGNOSIS — G0481 Other encephalitis and encephalomyelitis: Secondary | ICD-10-CM | POA: Diagnosis not present

## 2021-03-24 DIAGNOSIS — R509 Fever, unspecified: Secondary | ICD-10-CM | POA: Diagnosis not present

## 2021-03-24 DIAGNOSIS — I959 Hypotension, unspecified: Secondary | ICD-10-CM | POA: Diagnosis not present

## 2021-03-25 DIAGNOSIS — J155 Pneumonia due to Escherichia coli: Secondary | ICD-10-CM | POA: Diagnosis not present

## 2021-03-25 DIAGNOSIS — B9620 Unspecified Escherichia coli [E. coli] as the cause of diseases classified elsewhere: Secondary | ICD-10-CM | POA: Diagnosis not present

## 2021-03-25 DIAGNOSIS — G0481 Other encephalitis and encephalomyelitis: Secondary | ICD-10-CM | POA: Diagnosis not present

## 2021-03-25 DIAGNOSIS — R1310 Dysphagia, unspecified: Secondary | ICD-10-CM | POA: Diagnosis not present

## 2021-03-25 DIAGNOSIS — J9610 Chronic respiratory failure, unspecified whether with hypoxia or hypercapnia: Secondary | ICD-10-CM | POA: Diagnosis not present

## 2021-03-25 DIAGNOSIS — J15211 Pneumonia due to Methicillin susceptible Staphylococcus aureus: Secondary | ICD-10-CM | POA: Diagnosis not present

## 2021-03-25 DIAGNOSIS — R918 Other nonspecific abnormal finding of lung field: Secondary | ICD-10-CM | POA: Diagnosis not present

## 2021-03-25 DIAGNOSIS — I959 Hypotension, unspecified: Secondary | ICD-10-CM | POA: Diagnosis not present

## 2021-03-25 DIAGNOSIS — R509 Fever, unspecified: Secondary | ICD-10-CM | POA: Diagnosis not present

## 2021-03-25 DIAGNOSIS — D72829 Elevated white blood cell count, unspecified: Secondary | ICD-10-CM | POA: Diagnosis not present

## 2021-03-25 DIAGNOSIS — Z930 Tracheostomy status: Secondary | ICD-10-CM | POA: Diagnosis not present

## 2021-03-25 DIAGNOSIS — D509 Iron deficiency anemia, unspecified: Secondary | ICD-10-CM | POA: Diagnosis not present

## 2021-03-25 DIAGNOSIS — R739 Hyperglycemia, unspecified: Secondary | ICD-10-CM | POA: Diagnosis not present

## 2021-03-26 DIAGNOSIS — R1310 Dysphagia, unspecified: Secondary | ICD-10-CM | POA: Diagnosis not present

## 2021-03-26 DIAGNOSIS — D509 Iron deficiency anemia, unspecified: Secondary | ICD-10-CM | POA: Diagnosis not present

## 2021-03-26 DIAGNOSIS — R509 Fever, unspecified: Secondary | ICD-10-CM | POA: Diagnosis not present

## 2021-03-26 DIAGNOSIS — R739 Hyperglycemia, unspecified: Secondary | ICD-10-CM | POA: Diagnosis not present

## 2021-03-26 DIAGNOSIS — Z930 Tracheostomy status: Secondary | ICD-10-CM | POA: Diagnosis not present

## 2021-03-26 DIAGNOSIS — J9610 Chronic respiratory failure, unspecified whether with hypoxia or hypercapnia: Secondary | ICD-10-CM | POA: Diagnosis not present

## 2021-03-26 DIAGNOSIS — R1110 Vomiting, unspecified: Secondary | ICD-10-CM | POA: Diagnosis not present

## 2021-03-26 DIAGNOSIS — G0481 Other encephalitis and encephalomyelitis: Secondary | ICD-10-CM | POA: Diagnosis not present

## 2021-03-26 DIAGNOSIS — D72829 Elevated white blood cell count, unspecified: Secondary | ICD-10-CM | POA: Diagnosis not present

## 2021-03-26 DIAGNOSIS — B9620 Unspecified Escherichia coli [E. coli] as the cause of diseases classified elsewhere: Secondary | ICD-10-CM | POA: Diagnosis not present

## 2021-03-26 DIAGNOSIS — R918 Other nonspecific abnormal finding of lung field: Secondary | ICD-10-CM | POA: Diagnosis not present

## 2021-03-26 DIAGNOSIS — J155 Pneumonia due to Escherichia coli: Secondary | ICD-10-CM | POA: Diagnosis not present

## 2021-03-26 DIAGNOSIS — I959 Hypotension, unspecified: Secondary | ICD-10-CM | POA: Diagnosis not present

## 2021-03-26 DIAGNOSIS — J15211 Pneumonia due to Methicillin susceptible Staphylococcus aureus: Secondary | ICD-10-CM | POA: Diagnosis not present

## 2021-03-27 DIAGNOSIS — Z930 Tracheostomy status: Secondary | ICD-10-CM | POA: Diagnosis not present

## 2021-03-27 DIAGNOSIS — B9561 Methicillin susceptible Staphylococcus aureus infection as the cause of diseases classified elsewhere: Secondary | ICD-10-CM | POA: Diagnosis not present

## 2021-03-27 DIAGNOSIS — A4101 Sepsis due to Methicillin susceptible Staphylococcus aureus: Secondary | ICD-10-CM | POA: Diagnosis not present

## 2021-03-27 DIAGNOSIS — J15211 Pneumonia due to Methicillin susceptible Staphylococcus aureus: Secondary | ICD-10-CM | POA: Diagnosis not present

## 2021-03-27 DIAGNOSIS — I959 Hypotension, unspecified: Secondary | ICD-10-CM | POA: Diagnosis not present

## 2021-03-27 DIAGNOSIS — D72829 Elevated white blood cell count, unspecified: Secondary | ICD-10-CM | POA: Diagnosis not present

## 2021-03-27 DIAGNOSIS — B1910 Unspecified viral hepatitis B without hepatic coma: Secondary | ICD-10-CM | POA: Diagnosis not present

## 2021-03-27 DIAGNOSIS — J9611 Chronic respiratory failure with hypoxia: Secondary | ICD-10-CM | POA: Diagnosis not present

## 2021-03-27 DIAGNOSIS — J155 Pneumonia due to Escherichia coli: Secondary | ICD-10-CM | POA: Diagnosis not present

## 2021-03-27 DIAGNOSIS — B9620 Unspecified Escherichia coli [E. coli] as the cause of diseases classified elsewhere: Secondary | ICD-10-CM | POA: Diagnosis not present

## 2021-03-27 DIAGNOSIS — R1310 Dysphagia, unspecified: Secondary | ICD-10-CM | POA: Diagnosis not present

## 2021-03-27 DIAGNOSIS — R739 Hyperglycemia, unspecified: Secondary | ICD-10-CM | POA: Diagnosis not present

## 2021-03-27 DIAGNOSIS — R509 Fever, unspecified: Secondary | ICD-10-CM | POA: Diagnosis not present

## 2021-03-27 DIAGNOSIS — G0481 Other encephalitis and encephalomyelitis: Secondary | ICD-10-CM | POA: Diagnosis not present

## 2021-03-27 DIAGNOSIS — J9690 Respiratory failure, unspecified, unspecified whether with hypoxia or hypercapnia: Secondary | ICD-10-CM | POA: Diagnosis not present

## 2021-03-28 DIAGNOSIS — G0481 Other encephalitis and encephalomyelitis: Secondary | ICD-10-CM | POA: Diagnosis not present

## 2021-03-28 DIAGNOSIS — J9690 Respiratory failure, unspecified, unspecified whether with hypoxia or hypercapnia: Secondary | ICD-10-CM | POA: Diagnosis not present

## 2021-03-28 DIAGNOSIS — Z930 Tracheostomy status: Secondary | ICD-10-CM | POA: Diagnosis not present

## 2021-03-28 DIAGNOSIS — I959 Hypotension, unspecified: Secondary | ICD-10-CM | POA: Diagnosis not present

## 2021-03-29 DIAGNOSIS — R1310 Dysphagia, unspecified: Secondary | ICD-10-CM | POA: Diagnosis not present

## 2021-03-29 DIAGNOSIS — J15211 Pneumonia due to Methicillin susceptible Staphylococcus aureus: Secondary | ICD-10-CM | POA: Diagnosis not present

## 2021-03-29 DIAGNOSIS — R000 Tachycardia, unspecified: Secondary | ICD-10-CM | POA: Diagnosis not present

## 2021-03-29 DIAGNOSIS — Z930 Tracheostomy status: Secondary | ICD-10-CM | POA: Diagnosis not present

## 2021-03-29 DIAGNOSIS — R579 Shock, unspecified: Secondary | ICD-10-CM | POA: Diagnosis not present

## 2021-03-29 DIAGNOSIS — Z9911 Dependence on respirator [ventilator] status: Secondary | ICD-10-CM | POA: Diagnosis not present

## 2021-03-29 DIAGNOSIS — J158 Pneumonia due to other specified bacteria: Secondary | ICD-10-CM | POA: Diagnosis not present

## 2021-03-29 DIAGNOSIS — B1910 Unspecified viral hepatitis B without hepatic coma: Secondary | ICD-10-CM | POA: Diagnosis not present

## 2021-03-29 DIAGNOSIS — J155 Pneumonia due to Escherichia coli: Secondary | ICD-10-CM | POA: Diagnosis not present

## 2021-03-29 DIAGNOSIS — J9611 Chronic respiratory failure with hypoxia: Secondary | ICD-10-CM | POA: Diagnosis not present

## 2021-03-29 DIAGNOSIS — G0481 Other encephalitis and encephalomyelitis: Secondary | ICD-10-CM | POA: Diagnosis not present

## 2021-03-30 DIAGNOSIS — J158 Pneumonia due to other specified bacteria: Secondary | ICD-10-CM | POA: Diagnosis not present

## 2021-03-30 DIAGNOSIS — R579 Shock, unspecified: Secondary | ICD-10-CM | POA: Diagnosis not present

## 2021-03-30 DIAGNOSIS — Z9911 Dependence on respirator [ventilator] status: Secondary | ICD-10-CM | POA: Diagnosis not present

## 2021-03-30 DIAGNOSIS — R002 Palpitations: Secondary | ICD-10-CM | POA: Diagnosis not present

## 2021-03-30 DIAGNOSIS — R1310 Dysphagia, unspecified: Secondary | ICD-10-CM | POA: Diagnosis not present

## 2021-03-30 DIAGNOSIS — Z930 Tracheostomy status: Secondary | ICD-10-CM | POA: Diagnosis not present

## 2021-03-30 DIAGNOSIS — J9611 Chronic respiratory failure with hypoxia: Secondary | ICD-10-CM | POA: Diagnosis not present

## 2021-03-30 DIAGNOSIS — R000 Tachycardia, unspecified: Secondary | ICD-10-CM | POA: Diagnosis not present

## 2021-03-30 DIAGNOSIS — B1910 Unspecified viral hepatitis B without hepatic coma: Secondary | ICD-10-CM | POA: Diagnosis not present

## 2021-03-30 DIAGNOSIS — G0481 Other encephalitis and encephalomyelitis: Secondary | ICD-10-CM | POA: Diagnosis not present

## 2021-03-30 DIAGNOSIS — J155 Pneumonia due to Escherichia coli: Secondary | ICD-10-CM | POA: Diagnosis not present

## 2021-03-30 DIAGNOSIS — J15211 Pneumonia due to Methicillin susceptible Staphylococcus aureus: Secondary | ICD-10-CM | POA: Diagnosis not present

## 2021-03-30 DIAGNOSIS — I3139 Other pericardial effusion (noninflammatory): Secondary | ICD-10-CM | POA: Diagnosis not present

## 2021-03-30 LAB — MTB RIF NAA NON-SPUTUM, W/O CULTURE

## 2021-03-31 DIAGNOSIS — J9611 Chronic respiratory failure with hypoxia: Secondary | ICD-10-CM | POA: Diagnosis not present

## 2021-03-31 DIAGNOSIS — J158 Pneumonia due to other specified bacteria: Secondary | ICD-10-CM | POA: Diagnosis not present

## 2021-03-31 DIAGNOSIS — J15211 Pneumonia due to Methicillin susceptible Staphylococcus aureus: Secondary | ICD-10-CM | POA: Diagnosis not present

## 2021-03-31 DIAGNOSIS — Z9911 Dependence on respirator [ventilator] status: Secondary | ICD-10-CM | POA: Diagnosis not present

## 2021-03-31 DIAGNOSIS — Z930 Tracheostomy status: Secondary | ICD-10-CM | POA: Diagnosis not present

## 2021-03-31 DIAGNOSIS — R1310 Dysphagia, unspecified: Secondary | ICD-10-CM | POA: Diagnosis not present

## 2021-03-31 DIAGNOSIS — F05 Delirium due to known physiological condition: Secondary | ICD-10-CM | POA: Diagnosis not present

## 2021-03-31 DIAGNOSIS — R579 Shock, unspecified: Secondary | ICD-10-CM | POA: Diagnosis not present

## 2021-03-31 DIAGNOSIS — B1910 Unspecified viral hepatitis B without hepatic coma: Secondary | ICD-10-CM | POA: Diagnosis not present

## 2021-03-31 DIAGNOSIS — J155 Pneumonia due to Escherichia coli: Secondary | ICD-10-CM | POA: Diagnosis not present

## 2021-03-31 DIAGNOSIS — R000 Tachycardia, unspecified: Secondary | ICD-10-CM | POA: Diagnosis not present

## 2021-03-31 DIAGNOSIS — G0481 Other encephalitis and encephalomyelitis: Secondary | ICD-10-CM | POA: Diagnosis not present

## 2021-04-01 DIAGNOSIS — Z930 Tracheostomy status: Secondary | ICD-10-CM | POA: Diagnosis not present

## 2021-04-01 DIAGNOSIS — G0481 Other encephalitis and encephalomyelitis: Secondary | ICD-10-CM | POA: Diagnosis not present

## 2021-04-01 DIAGNOSIS — Z9911 Dependence on respirator [ventilator] status: Secondary | ICD-10-CM | POA: Diagnosis not present

## 2021-04-01 DIAGNOSIS — J158 Pneumonia due to other specified bacteria: Secondary | ICD-10-CM | POA: Diagnosis not present

## 2021-04-01 DIAGNOSIS — J9611 Chronic respiratory failure with hypoxia: Secondary | ICD-10-CM | POA: Diagnosis not present

## 2021-04-01 DIAGNOSIS — R1310 Dysphagia, unspecified: Secondary | ICD-10-CM | POA: Diagnosis not present

## 2021-04-01 DIAGNOSIS — R000 Tachycardia, unspecified: Secondary | ICD-10-CM | POA: Diagnosis not present

## 2021-04-01 DIAGNOSIS — B1910 Unspecified viral hepatitis B without hepatic coma: Secondary | ICD-10-CM | POA: Diagnosis not present

## 2021-04-01 DIAGNOSIS — R579 Shock, unspecified: Secondary | ICD-10-CM | POA: Diagnosis not present

## 2021-04-01 DIAGNOSIS — J15211 Pneumonia due to Methicillin susceptible Staphylococcus aureus: Secondary | ICD-10-CM | POA: Diagnosis not present

## 2021-04-01 DIAGNOSIS — J155 Pneumonia due to Escherichia coli: Secondary | ICD-10-CM | POA: Diagnosis not present

## 2021-04-02 DIAGNOSIS — I959 Hypotension, unspecified: Secondary | ICD-10-CM | POA: Diagnosis not present

## 2021-04-02 DIAGNOSIS — G0481 Other encephalitis and encephalomyelitis: Secondary | ICD-10-CM | POA: Diagnosis not present

## 2021-04-02 DIAGNOSIS — J9690 Respiratory failure, unspecified, unspecified whether with hypoxia or hypercapnia: Secondary | ICD-10-CM | POA: Diagnosis not present

## 2021-04-03 DIAGNOSIS — J9690 Respiratory failure, unspecified, unspecified whether with hypoxia or hypercapnia: Secondary | ICD-10-CM | POA: Diagnosis not present

## 2021-04-03 DIAGNOSIS — G0481 Other encephalitis and encephalomyelitis: Secondary | ICD-10-CM | POA: Diagnosis not present

## 2021-04-03 DIAGNOSIS — I959 Hypotension, unspecified: Secondary | ICD-10-CM | POA: Diagnosis not present

## 2021-04-04 DIAGNOSIS — G0481 Other encephalitis and encephalomyelitis: Secondary | ICD-10-CM | POA: Diagnosis not present

## 2021-04-04 DIAGNOSIS — R1310 Dysphagia, unspecified: Secondary | ICD-10-CM | POA: Diagnosis not present

## 2021-04-04 DIAGNOSIS — I952 Hypotension due to drugs: Secondary | ICD-10-CM | POA: Diagnosis not present

## 2021-04-04 DIAGNOSIS — I959 Hypotension, unspecified: Secondary | ICD-10-CM | POA: Diagnosis not present

## 2021-04-04 DIAGNOSIS — D649 Anemia, unspecified: Secondary | ICD-10-CM | POA: Diagnosis not present

## 2021-04-04 DIAGNOSIS — J9690 Respiratory failure, unspecified, unspecified whether with hypoxia or hypercapnia: Secondary | ICD-10-CM | POA: Diagnosis not present

## 2021-04-04 DIAGNOSIS — J9611 Chronic respiratory failure with hypoxia: Secondary | ICD-10-CM | POA: Diagnosis not present

## 2021-04-04 DIAGNOSIS — J15211 Pneumonia due to Methicillin susceptible Staphylococcus aureus: Secondary | ICD-10-CM | POA: Diagnosis not present

## 2021-04-04 DIAGNOSIS — Z930 Tracheostomy status: Secondary | ICD-10-CM | POA: Diagnosis not present

## 2021-04-04 DIAGNOSIS — Z9911 Dependence on respirator [ventilator] status: Secondary | ICD-10-CM | POA: Diagnosis not present

## 2021-04-05 DIAGNOSIS — J15211 Pneumonia due to Methicillin susceptible Staphylococcus aureus: Secondary | ICD-10-CM | POA: Diagnosis not present

## 2021-04-05 DIAGNOSIS — B9561 Methicillin susceptible Staphylococcus aureus infection as the cause of diseases classified elsewhere: Secondary | ICD-10-CM | POA: Diagnosis not present

## 2021-04-05 DIAGNOSIS — J95851 Ventilator associated pneumonia: Secondary | ICD-10-CM | POA: Diagnosis not present

## 2021-04-05 DIAGNOSIS — J9611 Chronic respiratory failure with hypoxia: Secondary | ICD-10-CM | POA: Diagnosis not present

## 2021-04-05 DIAGNOSIS — Z930 Tracheostomy status: Secondary | ICD-10-CM | POA: Diagnosis not present

## 2021-04-05 DIAGNOSIS — I952 Hypotension due to drugs: Secondary | ICD-10-CM | POA: Diagnosis not present

## 2021-04-05 DIAGNOSIS — Z9911 Dependence on respirator [ventilator] status: Secondary | ICD-10-CM | POA: Diagnosis not present

## 2021-04-05 DIAGNOSIS — G0481 Other encephalitis and encephalomyelitis: Secondary | ICD-10-CM | POA: Diagnosis not present

## 2021-04-05 DIAGNOSIS — G9341 Metabolic encephalopathy: Secondary | ICD-10-CM | POA: Diagnosis not present

## 2021-04-05 DIAGNOSIS — G901 Familial dysautonomia [Riley-Day]: Secondary | ICD-10-CM | POA: Diagnosis not present

## 2021-04-05 DIAGNOSIS — B9620 Unspecified Escherichia coli [E. coli] as the cause of diseases classified elsewhere: Secondary | ICD-10-CM | POA: Diagnosis not present

## 2021-04-05 DIAGNOSIS — B1910 Unspecified viral hepatitis B without hepatic coma: Secondary | ICD-10-CM | POA: Diagnosis not present

## 2021-04-05 DIAGNOSIS — J9601 Acute respiratory failure with hypoxia: Secondary | ICD-10-CM | POA: Diagnosis not present

## 2021-04-05 DIAGNOSIS — R1310 Dysphagia, unspecified: Secondary | ICD-10-CM | POA: Diagnosis not present

## 2021-04-05 DIAGNOSIS — J155 Pneumonia due to Escherichia coli: Secondary | ICD-10-CM | POA: Diagnosis not present

## 2021-04-05 DIAGNOSIS — B958 Unspecified staphylococcus as the cause of diseases classified elsewhere: Secondary | ICD-10-CM | POA: Diagnosis not present

## 2021-04-06 DIAGNOSIS — G9341 Metabolic encephalopathy: Secondary | ICD-10-CM | POA: Diagnosis not present

## 2021-04-06 DIAGNOSIS — D649 Anemia, unspecified: Secondary | ICD-10-CM | POA: Diagnosis not present

## 2021-04-06 DIAGNOSIS — G249 Dystonia, unspecified: Secondary | ICD-10-CM | POA: Diagnosis not present

## 2021-04-06 DIAGNOSIS — B958 Unspecified staphylococcus as the cause of diseases classified elsewhere: Secondary | ICD-10-CM | POA: Diagnosis not present

## 2021-04-06 DIAGNOSIS — I952 Hypotension due to drugs: Secondary | ICD-10-CM | POA: Diagnosis not present

## 2021-04-06 DIAGNOSIS — G0481 Other encephalitis and encephalomyelitis: Secondary | ICD-10-CM | POA: Diagnosis not present

## 2021-04-06 DIAGNOSIS — J15211 Pneumonia due to Methicillin susceptible Staphylococcus aureus: Secondary | ICD-10-CM | POA: Diagnosis not present

## 2021-04-06 DIAGNOSIS — J9610 Chronic respiratory failure, unspecified whether with hypoxia or hypercapnia: Secondary | ICD-10-CM | POA: Diagnosis not present

## 2021-04-06 DIAGNOSIS — J155 Pneumonia due to Escherichia coli: Secondary | ICD-10-CM | POA: Diagnosis not present

## 2021-04-06 DIAGNOSIS — J95851 Ventilator associated pneumonia: Secondary | ICD-10-CM | POA: Diagnosis not present

## 2021-04-06 DIAGNOSIS — R509 Fever, unspecified: Secondary | ICD-10-CM | POA: Diagnosis not present

## 2021-04-06 DIAGNOSIS — J9601 Acute respiratory failure with hypoxia: Secondary | ICD-10-CM | POA: Diagnosis not present

## 2021-04-06 DIAGNOSIS — Z930 Tracheostomy status: Secondary | ICD-10-CM | POA: Diagnosis not present

## 2021-04-06 DIAGNOSIS — B9620 Unspecified Escherichia coli [E. coli] as the cause of diseases classified elsewhere: Secondary | ICD-10-CM | POA: Diagnosis not present

## 2021-04-06 DIAGNOSIS — B1910 Unspecified viral hepatitis B without hepatic coma: Secondary | ICD-10-CM | POA: Diagnosis not present

## 2021-04-06 DIAGNOSIS — Z9911 Dependence on respirator [ventilator] status: Secondary | ICD-10-CM | POA: Diagnosis not present

## 2021-04-06 DIAGNOSIS — R1310 Dysphagia, unspecified: Secondary | ICD-10-CM | POA: Diagnosis not present

## 2021-04-06 DIAGNOSIS — G901 Familial dysautonomia [Riley-Day]: Secondary | ICD-10-CM | POA: Diagnosis not present

## 2021-04-06 DIAGNOSIS — B9561 Methicillin susceptible Staphylococcus aureus infection as the cause of diseases classified elsewhere: Secondary | ICD-10-CM | POA: Diagnosis not present

## 2021-04-06 DIAGNOSIS — R000 Tachycardia, unspecified: Secondary | ICD-10-CM | POA: Diagnosis not present

## 2021-04-07 DIAGNOSIS — J9621 Acute and chronic respiratory failure with hypoxia: Secondary | ICD-10-CM | POA: Diagnosis not present

## 2021-04-07 DIAGNOSIS — G0481 Other encephalitis and encephalomyelitis: Secondary | ICD-10-CM | POA: Diagnosis not present

## 2021-04-07 DIAGNOSIS — B9561 Methicillin susceptible Staphylococcus aureus infection as the cause of diseases classified elsewhere: Secondary | ICD-10-CM | POA: Diagnosis not present

## 2021-04-07 DIAGNOSIS — G901 Familial dysautonomia [Riley-Day]: Secondary | ICD-10-CM | POA: Diagnosis not present

## 2021-04-07 DIAGNOSIS — J15211 Pneumonia due to Methicillin susceptible Staphylococcus aureus: Secondary | ICD-10-CM | POA: Diagnosis not present

## 2021-04-07 DIAGNOSIS — J155 Pneumonia due to Escherichia coli: Secondary | ICD-10-CM | POA: Diagnosis not present

## 2021-04-07 DIAGNOSIS — J9610 Chronic respiratory failure, unspecified whether with hypoxia or hypercapnia: Secondary | ICD-10-CM | POA: Diagnosis not present

## 2021-04-07 DIAGNOSIS — B9620 Unspecified Escherichia coli [E. coli] as the cause of diseases classified elsewhere: Secondary | ICD-10-CM | POA: Diagnosis not present

## 2021-04-07 DIAGNOSIS — J95851 Ventilator associated pneumonia: Secondary | ICD-10-CM | POA: Diagnosis not present

## 2021-04-07 DIAGNOSIS — G9341 Metabolic encephalopathy: Secondary | ICD-10-CM | POA: Diagnosis not present

## 2021-04-07 DIAGNOSIS — G249 Dystonia, unspecified: Secondary | ICD-10-CM | POA: Diagnosis not present

## 2021-04-07 DIAGNOSIS — E039 Hypothyroidism, unspecified: Secondary | ICD-10-CM | POA: Diagnosis not present

## 2021-04-07 DIAGNOSIS — D649 Anemia, unspecified: Secondary | ICD-10-CM | POA: Diagnosis not present

## 2021-04-07 DIAGNOSIS — I952 Hypotension due to drugs: Secondary | ICD-10-CM | POA: Diagnosis not present

## 2021-04-07 DIAGNOSIS — Z930 Tracheostomy status: Secondary | ICD-10-CM | POA: Diagnosis not present

## 2021-04-08 DIAGNOSIS — J15211 Pneumonia due to Methicillin susceptible Staphylococcus aureus: Secondary | ICD-10-CM | POA: Diagnosis not present

## 2021-04-08 DIAGNOSIS — G9341 Metabolic encephalopathy: Secondary | ICD-10-CM | POA: Diagnosis not present

## 2021-04-08 DIAGNOSIS — G901 Familial dysautonomia [Riley-Day]: Secondary | ICD-10-CM | POA: Diagnosis not present

## 2021-04-08 DIAGNOSIS — I952 Hypotension due to drugs: Secondary | ICD-10-CM | POA: Diagnosis not present

## 2021-04-08 DIAGNOSIS — J9601 Acute respiratory failure with hypoxia: Secondary | ICD-10-CM | POA: Diagnosis not present

## 2021-04-08 DIAGNOSIS — R002 Palpitations: Secondary | ICD-10-CM | POA: Diagnosis not present

## 2021-04-08 DIAGNOSIS — J9611 Chronic respiratory failure with hypoxia: Secondary | ICD-10-CM | POA: Diagnosis not present

## 2021-04-08 DIAGNOSIS — G0481 Other encephalitis and encephalomyelitis: Secondary | ICD-10-CM | POA: Diagnosis not present

## 2021-04-08 DIAGNOSIS — Z930 Tracheostomy status: Secondary | ICD-10-CM | POA: Diagnosis not present

## 2021-04-08 DIAGNOSIS — I493 Ventricular premature depolarization: Secondary | ICD-10-CM | POA: Diagnosis not present

## 2021-04-08 DIAGNOSIS — J155 Pneumonia due to Escherichia coli: Secondary | ICD-10-CM | POA: Diagnosis not present

## 2021-04-08 DIAGNOSIS — Z9911 Dependence on respirator [ventilator] status: Secondary | ICD-10-CM | POA: Diagnosis not present

## 2021-04-09 DIAGNOSIS — R1319 Other dysphagia: Secondary | ICD-10-CM | POA: Diagnosis not present

## 2021-04-09 DIAGNOSIS — G901 Familial dysautonomia [Riley-Day]: Secondary | ICD-10-CM | POA: Diagnosis not present

## 2021-04-09 DIAGNOSIS — J9610 Chronic respiratory failure, unspecified whether with hypoxia or hypercapnia: Secondary | ICD-10-CM | POA: Diagnosis not present

## 2021-04-09 DIAGNOSIS — J15211 Pneumonia due to Methicillin susceptible Staphylococcus aureus: Secondary | ICD-10-CM | POA: Diagnosis not present

## 2021-04-09 DIAGNOSIS — B9561 Methicillin susceptible Staphylococcus aureus infection as the cause of diseases classified elsewhere: Secondary | ICD-10-CM | POA: Diagnosis not present

## 2021-04-09 DIAGNOSIS — Z452 Encounter for adjustment and management of vascular access device: Secondary | ICD-10-CM | POA: Diagnosis not present

## 2021-04-09 DIAGNOSIS — Z930 Tracheostomy status: Secondary | ICD-10-CM | POA: Diagnosis not present

## 2021-04-09 DIAGNOSIS — Z9911 Dependence on respirator [ventilator] status: Secondary | ICD-10-CM | POA: Diagnosis not present

## 2021-04-09 DIAGNOSIS — I952 Hypotension due to drugs: Secondary | ICD-10-CM | POA: Diagnosis not present

## 2021-04-09 DIAGNOSIS — G0481 Other encephalitis and encephalomyelitis: Secondary | ICD-10-CM | POA: Diagnosis not present

## 2021-04-09 DIAGNOSIS — J95851 Ventilator associated pneumonia: Secondary | ICD-10-CM | POA: Diagnosis not present

## 2021-04-09 DIAGNOSIS — J9601 Acute respiratory failure with hypoxia: Secondary | ICD-10-CM | POA: Diagnosis not present

## 2021-04-09 DIAGNOSIS — D649 Anemia, unspecified: Secondary | ICD-10-CM | POA: Diagnosis not present

## 2021-04-10 DIAGNOSIS — R1319 Other dysphagia: Secondary | ICD-10-CM | POA: Diagnosis not present

## 2021-04-10 DIAGNOSIS — J95851 Ventilator associated pneumonia: Secondary | ICD-10-CM | POA: Diagnosis not present

## 2021-04-10 DIAGNOSIS — G9341 Metabolic encephalopathy: Secondary | ICD-10-CM | POA: Diagnosis not present

## 2021-04-10 DIAGNOSIS — J155 Pneumonia due to Escherichia coli: Secondary | ICD-10-CM | POA: Diagnosis not present

## 2021-04-10 DIAGNOSIS — G0481 Other encephalitis and encephalomyelitis: Secondary | ICD-10-CM | POA: Diagnosis not present

## 2021-04-10 DIAGNOSIS — D649 Anemia, unspecified: Secondary | ICD-10-CM | POA: Diagnosis not present

## 2021-04-10 DIAGNOSIS — J9610 Chronic respiratory failure, unspecified whether with hypoxia or hypercapnia: Secondary | ICD-10-CM | POA: Diagnosis not present

## 2021-04-10 DIAGNOSIS — J9601 Acute respiratory failure with hypoxia: Secondary | ICD-10-CM | POA: Diagnosis not present

## 2021-04-10 DIAGNOSIS — J15211 Pneumonia due to Methicillin susceptible Staphylococcus aureus: Secondary | ICD-10-CM | POA: Diagnosis not present

## 2021-04-10 DIAGNOSIS — B9561 Methicillin susceptible Staphylococcus aureus infection as the cause of diseases classified elsewhere: Secondary | ICD-10-CM | POA: Diagnosis not present

## 2021-04-10 DIAGNOSIS — Z9911 Dependence on respirator [ventilator] status: Secondary | ICD-10-CM | POA: Diagnosis not present

## 2021-04-10 DIAGNOSIS — G249 Dystonia, unspecified: Secondary | ICD-10-CM | POA: Diagnosis not present

## 2021-04-10 DIAGNOSIS — Z930 Tracheostomy status: Secondary | ICD-10-CM | POA: Diagnosis not present

## 2021-04-10 DIAGNOSIS — J1520 Pneumonia due to staphylococcus, unspecified: Secondary | ICD-10-CM | POA: Diagnosis not present

## 2021-04-10 DIAGNOSIS — I952 Hypotension due to drugs: Secondary | ICD-10-CM | POA: Diagnosis not present

## 2021-04-11 DIAGNOSIS — J9601 Acute respiratory failure with hypoxia: Secondary | ICD-10-CM | POA: Diagnosis not present

## 2021-04-11 DIAGNOSIS — Z9911 Dependence on respirator [ventilator] status: Secondary | ICD-10-CM | POA: Diagnosis not present

## 2021-04-11 DIAGNOSIS — J15211 Pneumonia due to Methicillin susceptible Staphylococcus aureus: Secondary | ICD-10-CM | POA: Diagnosis not present

## 2021-04-11 DIAGNOSIS — J155 Pneumonia due to Escherichia coli: Secondary | ICD-10-CM | POA: Diagnosis not present

## 2021-04-11 DIAGNOSIS — R1319 Other dysphagia: Secondary | ICD-10-CM | POA: Diagnosis not present

## 2021-04-11 DIAGNOSIS — R509 Fever, unspecified: Secondary | ICD-10-CM | POA: Diagnosis not present

## 2021-04-11 DIAGNOSIS — B9561 Methicillin susceptible Staphylococcus aureus infection as the cause of diseases classified elsewhere: Secondary | ICD-10-CM | POA: Diagnosis not present

## 2021-04-11 DIAGNOSIS — D649 Anemia, unspecified: Secondary | ICD-10-CM | POA: Diagnosis not present

## 2021-04-11 DIAGNOSIS — J1520 Pneumonia due to staphylococcus, unspecified: Secondary | ICD-10-CM | POA: Diagnosis not present

## 2021-04-11 DIAGNOSIS — J95851 Ventilator associated pneumonia: Secondary | ICD-10-CM | POA: Diagnosis not present

## 2021-04-11 DIAGNOSIS — G249 Dystonia, unspecified: Secondary | ICD-10-CM | POA: Diagnosis not present

## 2021-04-11 DIAGNOSIS — Z930 Tracheostomy status: Secondary | ICD-10-CM | POA: Diagnosis not present

## 2021-04-11 DIAGNOSIS — G9341 Metabolic encephalopathy: Secondary | ICD-10-CM | POA: Diagnosis not present

## 2021-04-11 DIAGNOSIS — G0481 Other encephalitis and encephalomyelitis: Secondary | ICD-10-CM | POA: Diagnosis not present

## 2021-04-11 DIAGNOSIS — I952 Hypotension due to drugs: Secondary | ICD-10-CM | POA: Diagnosis not present

## 2021-04-11 DIAGNOSIS — R768 Other specified abnormal immunological findings in serum: Secondary | ICD-10-CM | POA: Diagnosis not present

## 2021-04-11 DIAGNOSIS — D72829 Elevated white blood cell count, unspecified: Secondary | ICD-10-CM | POA: Diagnosis not present

## 2021-04-11 DIAGNOSIS — J9610 Chronic respiratory failure, unspecified whether with hypoxia or hypercapnia: Secondary | ICD-10-CM | POA: Diagnosis not present

## 2021-04-12 DIAGNOSIS — R509 Fever, unspecified: Secondary | ICD-10-CM | POA: Diagnosis not present

## 2021-04-12 DIAGNOSIS — T80219A Unspecified infection due to central venous catheter, initial encounter: Secondary | ICD-10-CM | POA: Diagnosis not present

## 2021-04-12 DIAGNOSIS — R000 Tachycardia, unspecified: Secondary | ICD-10-CM | POA: Diagnosis not present

## 2021-04-12 DIAGNOSIS — Z79630 Long term (current) use of alkylating agent: Secondary | ICD-10-CM | POA: Diagnosis not present

## 2021-04-12 DIAGNOSIS — T80211A Bloodstream infection due to central venous catheter, initial encounter: Secondary | ICD-10-CM | POA: Diagnosis not present

## 2021-04-12 DIAGNOSIS — R7881 Bacteremia: Secondary | ICD-10-CM | POA: Diagnosis not present

## 2021-04-12 DIAGNOSIS — Z79899 Other long term (current) drug therapy: Secondary | ICD-10-CM | POA: Diagnosis not present

## 2021-04-12 DIAGNOSIS — R768 Other specified abnormal immunological findings in serum: Secondary | ICD-10-CM | POA: Diagnosis not present

## 2021-04-12 DIAGNOSIS — Z9911 Dependence on respirator [ventilator] status: Secondary | ICD-10-CM | POA: Diagnosis not present

## 2021-04-12 DIAGNOSIS — I10 Essential (primary) hypertension: Secondary | ICD-10-CM | POA: Diagnosis not present

## 2021-04-12 DIAGNOSIS — G249 Dystonia, unspecified: Secondary | ICD-10-CM | POA: Diagnosis not present

## 2021-04-12 DIAGNOSIS — Z931 Gastrostomy status: Secondary | ICD-10-CM | POA: Diagnosis not present

## 2021-04-12 DIAGNOSIS — D84821 Immunodeficiency due to drugs: Secondary | ICD-10-CM | POA: Diagnosis not present

## 2021-04-12 DIAGNOSIS — G0481 Other encephalitis and encephalomyelitis: Secondary | ICD-10-CM | POA: Diagnosis not present

## 2021-04-12 DIAGNOSIS — G0490 Encephalitis and encephalomyelitis, unspecified: Secondary | ICD-10-CM | POA: Diagnosis not present

## 2021-04-12 DIAGNOSIS — B952 Enterococcus as the cause of diseases classified elsewhere: Secondary | ICD-10-CM | POA: Diagnosis not present

## 2021-04-12 DIAGNOSIS — G908 Other disorders of autonomic nervous system: Secondary | ICD-10-CM | POA: Diagnosis not present

## 2021-04-12 DIAGNOSIS — R1319 Other dysphagia: Secondary | ICD-10-CM | POA: Diagnosis not present

## 2021-04-12 DIAGNOSIS — Z930 Tracheostomy status: Secondary | ICD-10-CM | POA: Diagnosis not present

## 2021-04-12 DIAGNOSIS — B1910 Unspecified viral hepatitis B without hepatic coma: Secondary | ICD-10-CM | POA: Diagnosis not present

## 2021-04-12 DIAGNOSIS — J9611 Chronic respiratory failure with hypoxia: Secondary | ICD-10-CM | POA: Diagnosis not present

## 2021-04-12 DIAGNOSIS — D72829 Elevated white blood cell count, unspecified: Secondary | ICD-10-CM | POA: Diagnosis not present

## 2021-04-12 DIAGNOSIS — R569 Unspecified convulsions: Secondary | ICD-10-CM | POA: Diagnosis not present

## 2021-04-13 DIAGNOSIS — G0481 Other encephalitis and encephalomyelitis: Secondary | ICD-10-CM | POA: Diagnosis not present

## 2021-04-13 DIAGNOSIS — I10 Essential (primary) hypertension: Secondary | ICD-10-CM | POA: Diagnosis not present

## 2021-04-13 DIAGNOSIS — X509XXA Other and unspecified overexertion or strenuous movements or postures, initial encounter: Secondary | ICD-10-CM | POA: Diagnosis not present

## 2021-04-13 DIAGNOSIS — G0490 Encephalitis and encephalomyelitis, unspecified: Secondary | ICD-10-CM | POA: Diagnosis not present

## 2021-04-13 DIAGNOSIS — R000 Tachycardia, unspecified: Secondary | ICD-10-CM | POA: Diagnosis not present

## 2021-04-13 DIAGNOSIS — B1910 Unspecified viral hepatitis B without hepatic coma: Secondary | ICD-10-CM | POA: Diagnosis not present

## 2021-04-13 DIAGNOSIS — G9349 Other encephalopathy: Secondary | ICD-10-CM | POA: Diagnosis not present

## 2021-04-13 DIAGNOSIS — R451 Restlessness and agitation: Secondary | ICD-10-CM | POA: Diagnosis not present

## 2021-04-13 DIAGNOSIS — R569 Unspecified convulsions: Secondary | ICD-10-CM | POA: Diagnosis not present

## 2021-04-13 DIAGNOSIS — T80219A Unspecified infection due to central venous catheter, initial encounter: Secondary | ICD-10-CM | POA: Diagnosis not present

## 2021-04-13 DIAGNOSIS — D72829 Elevated white blood cell count, unspecified: Secondary | ICD-10-CM | POA: Diagnosis not present

## 2021-04-13 DIAGNOSIS — R768 Other specified abnormal immunological findings in serum: Secondary | ICD-10-CM | POA: Diagnosis not present

## 2021-04-13 DIAGNOSIS — B952 Enterococcus as the cause of diseases classified elsewhere: Secondary | ICD-10-CM | POA: Diagnosis not present

## 2021-04-13 DIAGNOSIS — R509 Fever, unspecified: Secondary | ICD-10-CM | POA: Diagnosis not present

## 2021-04-13 DIAGNOSIS — G249 Dystonia, unspecified: Secondary | ICD-10-CM | POA: Diagnosis not present

## 2021-04-14 DIAGNOSIS — G0490 Encephalitis and encephalomyelitis, unspecified: Secondary | ICD-10-CM | POA: Diagnosis not present

## 2021-04-14 DIAGNOSIS — R569 Unspecified convulsions: Secondary | ICD-10-CM | POA: Diagnosis not present

## 2021-04-14 DIAGNOSIS — Z9889 Other specified postprocedural states: Secondary | ICD-10-CM | POA: Diagnosis not present

## 2021-04-14 DIAGNOSIS — G249 Dystonia, unspecified: Secondary | ICD-10-CM | POA: Diagnosis not present

## 2021-04-14 DIAGNOSIS — D72829 Elevated white blood cell count, unspecified: Secondary | ICD-10-CM | POA: Diagnosis not present

## 2021-04-14 DIAGNOSIS — T80219A Unspecified infection due to central venous catheter, initial encounter: Secondary | ICD-10-CM | POA: Diagnosis not present

## 2021-04-14 DIAGNOSIS — I10 Essential (primary) hypertension: Secondary | ICD-10-CM | POA: Diagnosis not present

## 2021-04-14 DIAGNOSIS — G0481 Other encephalitis and encephalomyelitis: Secondary | ICD-10-CM | POA: Diagnosis not present

## 2021-04-14 DIAGNOSIS — X509XXA Other and unspecified overexertion or strenuous movements or postures, initial encounter: Secondary | ICD-10-CM | POA: Diagnosis not present

## 2021-04-14 DIAGNOSIS — J9610 Chronic respiratory failure, unspecified whether with hypoxia or hypercapnia: Secondary | ICD-10-CM | POA: Diagnosis not present

## 2021-04-14 DIAGNOSIS — R000 Tachycardia, unspecified: Secondary | ICD-10-CM | POA: Diagnosis not present

## 2021-04-14 DIAGNOSIS — R509 Fever, unspecified: Secondary | ICD-10-CM | POA: Diagnosis not present

## 2021-04-14 DIAGNOSIS — B1910 Unspecified viral hepatitis B without hepatic coma: Secondary | ICD-10-CM | POA: Diagnosis not present

## 2021-04-14 DIAGNOSIS — R451 Restlessness and agitation: Secondary | ICD-10-CM | POA: Diagnosis not present

## 2021-04-14 DIAGNOSIS — B952 Enterococcus as the cause of diseases classified elsewhere: Secondary | ICD-10-CM | POA: Diagnosis not present

## 2021-04-14 DIAGNOSIS — R002 Palpitations: Secondary | ICD-10-CM | POA: Diagnosis not present

## 2021-04-14 DIAGNOSIS — I3139 Other pericardial effusion (noninflammatory): Secondary | ICD-10-CM | POA: Diagnosis not present

## 2021-04-14 DIAGNOSIS — G9349 Other encephalopathy: Secondary | ICD-10-CM | POA: Diagnosis not present

## 2021-04-15 DIAGNOSIS — Z930 Tracheostomy status: Secondary | ICD-10-CM | POA: Diagnosis not present

## 2021-04-15 DIAGNOSIS — R1319 Other dysphagia: Secondary | ICD-10-CM | POA: Diagnosis not present

## 2021-04-15 DIAGNOSIS — T80218A Other infection due to central venous catheter, initial encounter: Secondary | ICD-10-CM | POA: Diagnosis not present

## 2021-04-15 DIAGNOSIS — J9610 Chronic respiratory failure, unspecified whether with hypoxia or hypercapnia: Secondary | ICD-10-CM | POA: Diagnosis not present

## 2021-04-15 DIAGNOSIS — R509 Fever, unspecified: Secondary | ICD-10-CM | POA: Diagnosis not present

## 2021-04-15 DIAGNOSIS — G0481 Other encephalitis and encephalomyelitis: Secondary | ICD-10-CM | POA: Diagnosis not present

## 2021-04-15 DIAGNOSIS — G9349 Other encephalopathy: Secondary | ICD-10-CM | POA: Diagnosis not present

## 2021-04-15 DIAGNOSIS — B952 Enterococcus as the cause of diseases classified elsewhere: Secondary | ICD-10-CM | POA: Diagnosis not present

## 2021-04-15 DIAGNOSIS — I10 Essential (primary) hypertension: Secondary | ICD-10-CM | POA: Diagnosis not present

## 2021-04-15 DIAGNOSIS — R000 Tachycardia, unspecified: Secondary | ICD-10-CM | POA: Diagnosis not present

## 2021-04-16 DIAGNOSIS — R509 Fever, unspecified: Secondary | ICD-10-CM | POA: Diagnosis not present

## 2021-04-16 DIAGNOSIS — D72829 Elevated white blood cell count, unspecified: Secondary | ICD-10-CM | POA: Diagnosis not present

## 2021-04-16 DIAGNOSIS — B1910 Unspecified viral hepatitis B without hepatic coma: Secondary | ICD-10-CM | POA: Diagnosis not present

## 2021-04-16 DIAGNOSIS — R451 Restlessness and agitation: Secondary | ICD-10-CM | POA: Diagnosis not present

## 2021-04-16 DIAGNOSIS — X509XXA Other and unspecified overexertion or strenuous movements or postures, initial encounter: Secondary | ICD-10-CM | POA: Diagnosis not present

## 2021-04-16 DIAGNOSIS — R000 Tachycardia, unspecified: Secondary | ICD-10-CM | POA: Diagnosis not present

## 2021-04-16 DIAGNOSIS — B952 Enterococcus as the cause of diseases classified elsewhere: Secondary | ICD-10-CM | POA: Diagnosis not present

## 2021-04-16 DIAGNOSIS — G0481 Other encephalitis and encephalomyelitis: Secondary | ICD-10-CM | POA: Diagnosis not present

## 2021-04-16 DIAGNOSIS — G249 Dystonia, unspecified: Secondary | ICD-10-CM | POA: Diagnosis not present

## 2021-04-16 DIAGNOSIS — I10 Essential (primary) hypertension: Secondary | ICD-10-CM | POA: Diagnosis not present

## 2021-04-16 DIAGNOSIS — R569 Unspecified convulsions: Secondary | ICD-10-CM | POA: Diagnosis not present

## 2021-04-16 DIAGNOSIS — G9349 Other encephalopathy: Secondary | ICD-10-CM | POA: Diagnosis not present

## 2021-04-17 DIAGNOSIS — T80218A Other infection due to central venous catheter, initial encounter: Secondary | ICD-10-CM | POA: Diagnosis not present

## 2021-04-17 DIAGNOSIS — B952 Enterococcus as the cause of diseases classified elsewhere: Secondary | ICD-10-CM | POA: Diagnosis not present

## 2021-04-17 DIAGNOSIS — J9610 Chronic respiratory failure, unspecified whether with hypoxia or hypercapnia: Secondary | ICD-10-CM | POA: Diagnosis not present

## 2021-04-17 DIAGNOSIS — G908 Other disorders of autonomic nervous system: Secondary | ICD-10-CM | POA: Diagnosis not present

## 2021-04-17 DIAGNOSIS — Z930 Tracheostomy status: Secondary | ICD-10-CM | POA: Diagnosis not present

## 2021-04-17 DIAGNOSIS — R1319 Other dysphagia: Secondary | ICD-10-CM | POA: Diagnosis not present

## 2021-04-17 DIAGNOSIS — D72829 Elevated white blood cell count, unspecified: Secondary | ICD-10-CM | POA: Diagnosis not present

## 2021-04-17 DIAGNOSIS — Z931 Gastrostomy status: Secondary | ICD-10-CM | POA: Diagnosis not present

## 2021-04-17 DIAGNOSIS — G0481 Other encephalitis and encephalomyelitis: Secondary | ICD-10-CM | POA: Diagnosis not present

## 2021-04-17 DIAGNOSIS — R258 Other abnormal involuntary movements: Secondary | ICD-10-CM | POA: Diagnosis not present

## 2021-04-17 DIAGNOSIS — R569 Unspecified convulsions: Secondary | ICD-10-CM | POA: Diagnosis not present

## 2021-04-17 DIAGNOSIS — R000 Tachycardia, unspecified: Secondary | ICD-10-CM | POA: Diagnosis not present

## 2021-04-18 DIAGNOSIS — G9349 Other encephalopathy: Secondary | ICD-10-CM | POA: Diagnosis not present

## 2021-04-18 DIAGNOSIS — R509 Fever, unspecified: Secondary | ICD-10-CM | POA: Diagnosis not present

## 2021-04-18 DIAGNOSIS — B952 Enterococcus as the cause of diseases classified elsewhere: Secondary | ICD-10-CM | POA: Diagnosis not present

## 2021-04-18 DIAGNOSIS — R000 Tachycardia, unspecified: Secondary | ICD-10-CM | POA: Diagnosis not present

## 2021-04-18 DIAGNOSIS — D72829 Elevated white blood cell count, unspecified: Secondary | ICD-10-CM | POA: Diagnosis not present

## 2021-04-18 DIAGNOSIS — X509XXA Other and unspecified overexertion or strenuous movements or postures, initial encounter: Secondary | ICD-10-CM | POA: Diagnosis not present

## 2021-04-18 DIAGNOSIS — R451 Restlessness and agitation: Secondary | ICD-10-CM | POA: Diagnosis not present

## 2021-04-18 DIAGNOSIS — G249 Dystonia, unspecified: Secondary | ICD-10-CM | POA: Diagnosis not present

## 2021-04-18 DIAGNOSIS — R569 Unspecified convulsions: Secondary | ICD-10-CM | POA: Diagnosis not present

## 2021-04-18 DIAGNOSIS — I10 Essential (primary) hypertension: Secondary | ICD-10-CM | POA: Diagnosis not present

## 2021-04-18 DIAGNOSIS — B1910 Unspecified viral hepatitis B without hepatic coma: Secondary | ICD-10-CM | POA: Diagnosis not present

## 2021-04-18 DIAGNOSIS — G0481 Other encephalitis and encephalomyelitis: Secondary | ICD-10-CM | POA: Diagnosis not present

## 2021-04-19 DIAGNOSIS — B952 Enterococcus as the cause of diseases classified elsewhere: Secondary | ICD-10-CM | POA: Diagnosis not present

## 2021-04-19 DIAGNOSIS — R451 Restlessness and agitation: Secondary | ICD-10-CM | POA: Diagnosis not present

## 2021-04-19 DIAGNOSIS — R1319 Other dysphagia: Secondary | ICD-10-CM | POA: Diagnosis not present

## 2021-04-19 DIAGNOSIS — T80219A Unspecified infection due to central venous catheter, initial encounter: Secondary | ICD-10-CM | POA: Diagnosis not present

## 2021-04-19 DIAGNOSIS — Z930 Tracheostomy status: Secondary | ICD-10-CM | POA: Diagnosis not present

## 2021-04-19 DIAGNOSIS — G2401 Drug induced subacute dyskinesia: Secondary | ICD-10-CM | POA: Diagnosis not present

## 2021-04-19 DIAGNOSIS — J9611 Chronic respiratory failure with hypoxia: Secondary | ICD-10-CM | POA: Diagnosis not present

## 2021-04-19 DIAGNOSIS — Z9911 Dependence on respirator [ventilator] status: Secondary | ICD-10-CM | POA: Diagnosis not present

## 2021-04-19 DIAGNOSIS — R000 Tachycardia, unspecified: Secondary | ICD-10-CM | POA: Diagnosis not present

## 2021-04-19 DIAGNOSIS — G0481 Other encephalitis and encephalomyelitis: Secondary | ICD-10-CM | POA: Diagnosis not present

## 2021-04-19 DIAGNOSIS — Z931 Gastrostomy status: Secondary | ICD-10-CM | POA: Diagnosis not present

## 2021-04-19 DIAGNOSIS — R509 Fever, unspecified: Secondary | ICD-10-CM | POA: Diagnosis not present

## 2021-04-19 DIAGNOSIS — J189 Pneumonia, unspecified organism: Secondary | ICD-10-CM | POA: Diagnosis not present

## 2021-04-19 DIAGNOSIS — B1910 Unspecified viral hepatitis B without hepatic coma: Secondary | ICD-10-CM | POA: Diagnosis not present

## 2021-04-19 DIAGNOSIS — I10 Essential (primary) hypertension: Secondary | ICD-10-CM | POA: Diagnosis not present

## 2021-04-19 DIAGNOSIS — G9349 Other encephalopathy: Secondary | ICD-10-CM | POA: Diagnosis not present

## 2021-04-19 DIAGNOSIS — G901 Familial dysautonomia [Riley-Day]: Secondary | ICD-10-CM | POA: Diagnosis not present

## 2021-04-20 DIAGNOSIS — G901 Familial dysautonomia [Riley-Day]: Secondary | ICD-10-CM | POA: Diagnosis not present

## 2021-04-20 DIAGNOSIS — Z931 Gastrostomy status: Secondary | ICD-10-CM | POA: Diagnosis not present

## 2021-04-20 DIAGNOSIS — B1910 Unspecified viral hepatitis B without hepatic coma: Secondary | ICD-10-CM | POA: Diagnosis not present

## 2021-04-20 DIAGNOSIS — Z9911 Dependence on respirator [ventilator] status: Secondary | ICD-10-CM | POA: Diagnosis not present

## 2021-04-20 DIAGNOSIS — R1319 Other dysphagia: Secondary | ICD-10-CM | POA: Diagnosis not present

## 2021-04-20 DIAGNOSIS — B952 Enterococcus as the cause of diseases classified elsewhere: Secondary | ICD-10-CM | POA: Diagnosis not present

## 2021-04-20 DIAGNOSIS — Z930 Tracheostomy status: Secondary | ICD-10-CM | POA: Diagnosis not present

## 2021-04-20 DIAGNOSIS — G0481 Other encephalitis and encephalomyelitis: Secondary | ICD-10-CM | POA: Diagnosis not present

## 2021-04-20 DIAGNOSIS — J9610 Chronic respiratory failure, unspecified whether with hypoxia or hypercapnia: Secondary | ICD-10-CM | POA: Diagnosis not present

## 2021-04-20 DIAGNOSIS — R509 Fever, unspecified: Secondary | ICD-10-CM | POA: Diagnosis not present

## 2021-04-20 DIAGNOSIS — J189 Pneumonia, unspecified organism: Secondary | ICD-10-CM | POA: Diagnosis not present

## 2021-04-20 DIAGNOSIS — I10 Essential (primary) hypertension: Secondary | ICD-10-CM | POA: Diagnosis not present

## 2021-04-20 DIAGNOSIS — R451 Restlessness and agitation: Secondary | ICD-10-CM | POA: Diagnosis not present

## 2021-04-20 DIAGNOSIS — G2401 Drug induced subacute dyskinesia: Secondary | ICD-10-CM | POA: Diagnosis not present

## 2021-04-20 DIAGNOSIS — R000 Tachycardia, unspecified: Secondary | ICD-10-CM | POA: Diagnosis not present

## 2021-04-21 DIAGNOSIS — Z931 Gastrostomy status: Secondary | ICD-10-CM | POA: Diagnosis not present

## 2021-04-21 DIAGNOSIS — R451 Restlessness and agitation: Secondary | ICD-10-CM | POA: Diagnosis not present

## 2021-04-21 DIAGNOSIS — Z9911 Dependence on respirator [ventilator] status: Secondary | ICD-10-CM | POA: Diagnosis not present

## 2021-04-21 DIAGNOSIS — R1319 Other dysphagia: Secondary | ICD-10-CM | POA: Diagnosis not present

## 2021-04-21 DIAGNOSIS — Z930 Tracheostomy status: Secondary | ICD-10-CM | POA: Diagnosis not present

## 2021-04-21 DIAGNOSIS — B1910 Unspecified viral hepatitis B without hepatic coma: Secondary | ICD-10-CM | POA: Diagnosis not present

## 2021-04-21 DIAGNOSIS — J9610 Chronic respiratory failure, unspecified whether with hypoxia or hypercapnia: Secondary | ICD-10-CM | POA: Diagnosis not present

## 2021-04-21 DIAGNOSIS — B952 Enterococcus as the cause of diseases classified elsewhere: Secondary | ICD-10-CM | POA: Diagnosis not present

## 2021-04-21 DIAGNOSIS — J189 Pneumonia, unspecified organism: Secondary | ICD-10-CM | POA: Diagnosis not present

## 2021-04-21 DIAGNOSIS — R000 Tachycardia, unspecified: Secondary | ICD-10-CM | POA: Diagnosis not present

## 2021-04-21 DIAGNOSIS — G901 Familial dysautonomia [Riley-Day]: Secondary | ICD-10-CM | POA: Diagnosis not present

## 2021-04-21 DIAGNOSIS — G0481 Other encephalitis and encephalomyelitis: Secondary | ICD-10-CM | POA: Diagnosis not present

## 2021-04-21 DIAGNOSIS — I10 Essential (primary) hypertension: Secondary | ICD-10-CM | POA: Diagnosis not present

## 2021-04-21 DIAGNOSIS — R509 Fever, unspecified: Secondary | ICD-10-CM | POA: Diagnosis not present

## 2021-04-21 DIAGNOSIS — G2401 Drug induced subacute dyskinesia: Secondary | ICD-10-CM | POA: Diagnosis not present

## 2021-04-22 DIAGNOSIS — Z9911 Dependence on respirator [ventilator] status: Secondary | ICD-10-CM | POA: Diagnosis not present

## 2021-04-22 DIAGNOSIS — R509 Fever, unspecified: Secondary | ICD-10-CM | POA: Diagnosis not present

## 2021-04-22 DIAGNOSIS — J9610 Chronic respiratory failure, unspecified whether with hypoxia or hypercapnia: Secondary | ICD-10-CM | POA: Diagnosis not present

## 2021-04-22 DIAGNOSIS — G0481 Other encephalitis and encephalomyelitis: Secondary | ICD-10-CM | POA: Diagnosis not present

## 2021-04-22 DIAGNOSIS — R451 Restlessness and agitation: Secondary | ICD-10-CM | POA: Diagnosis not present

## 2021-04-22 DIAGNOSIS — R000 Tachycardia, unspecified: Secondary | ICD-10-CM | POA: Diagnosis not present

## 2021-04-22 DIAGNOSIS — J189 Pneumonia, unspecified organism: Secondary | ICD-10-CM | POA: Diagnosis not present

## 2021-04-22 DIAGNOSIS — B952 Enterococcus as the cause of diseases classified elsewhere: Secondary | ICD-10-CM | POA: Diagnosis not present

## 2021-04-22 DIAGNOSIS — G901 Familial dysautonomia [Riley-Day]: Secondary | ICD-10-CM | POA: Diagnosis not present

## 2021-04-22 DIAGNOSIS — Z930 Tracheostomy status: Secondary | ICD-10-CM | POA: Diagnosis not present

## 2021-04-22 DIAGNOSIS — G2401 Drug induced subacute dyskinesia: Secondary | ICD-10-CM | POA: Diagnosis not present

## 2021-04-22 DIAGNOSIS — Z931 Gastrostomy status: Secondary | ICD-10-CM | POA: Diagnosis not present

## 2021-04-22 DIAGNOSIS — I10 Essential (primary) hypertension: Secondary | ICD-10-CM | POA: Diagnosis not present

## 2021-04-22 DIAGNOSIS — B1910 Unspecified viral hepatitis B without hepatic coma: Secondary | ICD-10-CM | POA: Diagnosis not present

## 2021-04-22 DIAGNOSIS — R1319 Other dysphagia: Secondary | ICD-10-CM | POA: Diagnosis not present

## 2021-04-23 DIAGNOSIS — I10 Essential (primary) hypertension: Secondary | ICD-10-CM | POA: Diagnosis not present

## 2021-04-23 DIAGNOSIS — R1319 Other dysphagia: Secondary | ICD-10-CM | POA: Diagnosis not present

## 2021-04-23 DIAGNOSIS — E870 Hyperosmolality and hypernatremia: Secondary | ICD-10-CM | POA: Diagnosis not present

## 2021-04-23 DIAGNOSIS — G0481 Other encephalitis and encephalomyelitis: Secondary | ICD-10-CM | POA: Diagnosis not present

## 2021-04-23 DIAGNOSIS — G901 Familial dysautonomia [Riley-Day]: Secondary | ICD-10-CM | POA: Diagnosis not present

## 2021-04-23 DIAGNOSIS — B952 Enterococcus as the cause of diseases classified elsewhere: Secondary | ICD-10-CM | POA: Diagnosis not present

## 2021-04-23 DIAGNOSIS — J189 Pneumonia, unspecified organism: Secondary | ICD-10-CM | POA: Diagnosis not present

## 2021-04-23 DIAGNOSIS — R0689 Other abnormalities of breathing: Secondary | ICD-10-CM | POA: Diagnosis not present

## 2021-04-23 DIAGNOSIS — B1910 Unspecified viral hepatitis B without hepatic coma: Secondary | ICD-10-CM | POA: Diagnosis not present

## 2021-04-23 DIAGNOSIS — Z931 Gastrostomy status: Secondary | ICD-10-CM | POA: Diagnosis not present

## 2021-04-23 DIAGNOSIS — R509 Fever, unspecified: Secondary | ICD-10-CM | POA: Diagnosis not present

## 2021-04-23 DIAGNOSIS — R451 Restlessness and agitation: Secondary | ICD-10-CM | POA: Diagnosis not present

## 2021-04-23 DIAGNOSIS — R000 Tachycardia, unspecified: Secondary | ICD-10-CM | POA: Diagnosis not present

## 2021-04-23 DIAGNOSIS — Z930 Tracheostomy status: Secondary | ICD-10-CM | POA: Diagnosis not present

## 2021-04-23 DIAGNOSIS — G2401 Drug induced subacute dyskinesia: Secondary | ICD-10-CM | POA: Diagnosis not present

## 2021-04-23 DIAGNOSIS — Z9911 Dependence on respirator [ventilator] status: Secondary | ICD-10-CM | POA: Diagnosis not present

## 2021-04-24 DIAGNOSIS — R451 Restlessness and agitation: Secondary | ICD-10-CM | POA: Diagnosis not present

## 2021-04-24 DIAGNOSIS — R0689 Other abnormalities of breathing: Secondary | ICD-10-CM | POA: Diagnosis not present

## 2021-04-24 DIAGNOSIS — B1910 Unspecified viral hepatitis B without hepatic coma: Secondary | ICD-10-CM | POA: Diagnosis not present

## 2021-04-24 DIAGNOSIS — Z930 Tracheostomy status: Secondary | ICD-10-CM | POA: Diagnosis not present

## 2021-04-24 DIAGNOSIS — G0481 Other encephalitis and encephalomyelitis: Secondary | ICD-10-CM | POA: Diagnosis not present

## 2021-04-24 DIAGNOSIS — I10 Essential (primary) hypertension: Secondary | ICD-10-CM | POA: Diagnosis not present

## 2021-04-24 DIAGNOSIS — G2401 Drug induced subacute dyskinesia: Secondary | ICD-10-CM | POA: Diagnosis not present

## 2021-04-24 DIAGNOSIS — R1319 Other dysphagia: Secondary | ICD-10-CM | POA: Diagnosis not present

## 2021-04-24 DIAGNOSIS — E870 Hyperosmolality and hypernatremia: Secondary | ICD-10-CM | POA: Diagnosis not present

## 2021-04-24 DIAGNOSIS — R509 Fever, unspecified: Secondary | ICD-10-CM | POA: Diagnosis not present

## 2021-04-24 DIAGNOSIS — Z9911 Dependence on respirator [ventilator] status: Secondary | ICD-10-CM | POA: Diagnosis not present

## 2021-04-24 DIAGNOSIS — B952 Enterococcus as the cause of diseases classified elsewhere: Secondary | ICD-10-CM | POA: Diagnosis not present

## 2021-04-24 DIAGNOSIS — G901 Familial dysautonomia [Riley-Day]: Secondary | ICD-10-CM | POA: Diagnosis not present

## 2021-04-24 DIAGNOSIS — J189 Pneumonia, unspecified organism: Secondary | ICD-10-CM | POA: Diagnosis not present

## 2021-04-24 DIAGNOSIS — R000 Tachycardia, unspecified: Secondary | ICD-10-CM | POA: Diagnosis not present

## 2021-04-24 DIAGNOSIS — Z931 Gastrostomy status: Secondary | ICD-10-CM | POA: Diagnosis not present

## 2021-04-25 DIAGNOSIS — G901 Familial dysautonomia [Riley-Day]: Secondary | ICD-10-CM | POA: Diagnosis not present

## 2021-04-25 DIAGNOSIS — R451 Restlessness and agitation: Secondary | ICD-10-CM | POA: Diagnosis not present

## 2021-04-25 DIAGNOSIS — R0689 Other abnormalities of breathing: Secondary | ICD-10-CM | POA: Diagnosis not present

## 2021-04-25 DIAGNOSIS — B952 Enterococcus as the cause of diseases classified elsewhere: Secondary | ICD-10-CM | POA: Diagnosis not present

## 2021-04-25 DIAGNOSIS — E870 Hyperosmolality and hypernatremia: Secondary | ICD-10-CM | POA: Diagnosis not present

## 2021-04-25 DIAGNOSIS — I10 Essential (primary) hypertension: Secondary | ICD-10-CM | POA: Diagnosis not present

## 2021-04-25 DIAGNOSIS — R509 Fever, unspecified: Secondary | ICD-10-CM | POA: Diagnosis not present

## 2021-04-25 DIAGNOSIS — Z931 Gastrostomy status: Secondary | ICD-10-CM | POA: Diagnosis not present

## 2021-04-25 DIAGNOSIS — J189 Pneumonia, unspecified organism: Secondary | ICD-10-CM | POA: Diagnosis not present

## 2021-04-25 DIAGNOSIS — G2401 Drug induced subacute dyskinesia: Secondary | ICD-10-CM | POA: Diagnosis not present

## 2021-04-25 DIAGNOSIS — Z9911 Dependence on respirator [ventilator] status: Secondary | ICD-10-CM | POA: Diagnosis not present

## 2021-04-25 DIAGNOSIS — R1319 Other dysphagia: Secondary | ICD-10-CM | POA: Diagnosis not present

## 2021-04-25 DIAGNOSIS — G0481 Other encephalitis and encephalomyelitis: Secondary | ICD-10-CM | POA: Diagnosis not present

## 2021-04-25 DIAGNOSIS — B1910 Unspecified viral hepatitis B without hepatic coma: Secondary | ICD-10-CM | POA: Diagnosis not present

## 2021-04-25 DIAGNOSIS — Z930 Tracheostomy status: Secondary | ICD-10-CM | POA: Diagnosis not present

## 2021-04-25 DIAGNOSIS — R000 Tachycardia, unspecified: Secondary | ICD-10-CM | POA: Diagnosis not present

## 2021-04-26 DIAGNOSIS — Z9911 Dependence on respirator [ventilator] status: Secondary | ICD-10-CM | POA: Diagnosis not present

## 2021-04-26 DIAGNOSIS — R451 Restlessness and agitation: Secondary | ICD-10-CM | POA: Diagnosis not present

## 2021-04-26 DIAGNOSIS — R0689 Other abnormalities of breathing: Secondary | ICD-10-CM | POA: Diagnosis not present

## 2021-04-26 DIAGNOSIS — B9620 Unspecified Escherichia coli [E. coli] as the cause of diseases classified elsewhere: Secondary | ICD-10-CM | POA: Diagnosis not present

## 2021-04-26 DIAGNOSIS — B952 Enterococcus as the cause of diseases classified elsewhere: Secondary | ICD-10-CM | POA: Diagnosis not present

## 2021-04-26 DIAGNOSIS — R000 Tachycardia, unspecified: Secondary | ICD-10-CM | POA: Diagnosis not present

## 2021-04-26 DIAGNOSIS — Z931 Gastrostomy status: Secondary | ICD-10-CM | POA: Diagnosis not present

## 2021-04-26 DIAGNOSIS — Z930 Tracheostomy status: Secondary | ICD-10-CM | POA: Diagnosis not present

## 2021-04-26 DIAGNOSIS — R509 Fever, unspecified: Secondary | ICD-10-CM | POA: Diagnosis not present

## 2021-04-26 DIAGNOSIS — I10 Essential (primary) hypertension: Secondary | ICD-10-CM | POA: Diagnosis not present

## 2021-04-26 DIAGNOSIS — B1910 Unspecified viral hepatitis B without hepatic coma: Secondary | ICD-10-CM | POA: Diagnosis not present

## 2021-04-26 DIAGNOSIS — R1319 Other dysphagia: Secondary | ICD-10-CM | POA: Diagnosis not present

## 2021-04-26 DIAGNOSIS — G0481 Other encephalitis and encephalomyelitis: Secondary | ICD-10-CM | POA: Diagnosis not present

## 2021-04-26 DIAGNOSIS — J189 Pneumonia, unspecified organism: Secondary | ICD-10-CM | POA: Diagnosis not present

## 2021-04-26 DIAGNOSIS — J9611 Chronic respiratory failure with hypoxia: Secondary | ICD-10-CM | POA: Diagnosis not present

## 2021-04-27 DIAGNOSIS — G2401 Drug induced subacute dyskinesia: Secondary | ICD-10-CM | POA: Diagnosis not present

## 2021-04-27 DIAGNOSIS — R509 Fever, unspecified: Secondary | ICD-10-CM | POA: Diagnosis not present

## 2021-04-27 DIAGNOSIS — J9611 Chronic respiratory failure with hypoxia: Secondary | ICD-10-CM | POA: Diagnosis not present

## 2021-04-27 DIAGNOSIS — I10 Essential (primary) hypertension: Secondary | ICD-10-CM | POA: Diagnosis not present

## 2021-04-27 DIAGNOSIS — Z9911 Dependence on respirator [ventilator] status: Secondary | ICD-10-CM | POA: Diagnosis not present

## 2021-04-27 DIAGNOSIS — R0689 Other abnormalities of breathing: Secondary | ICD-10-CM | POA: Diagnosis not present

## 2021-04-27 DIAGNOSIS — R1319 Other dysphagia: Secondary | ICD-10-CM | POA: Diagnosis not present

## 2021-04-27 DIAGNOSIS — J189 Pneumonia, unspecified organism: Secondary | ICD-10-CM | POA: Diagnosis not present

## 2021-04-27 DIAGNOSIS — G0481 Other encephalitis and encephalomyelitis: Secondary | ICD-10-CM | POA: Diagnosis not present

## 2021-04-27 DIAGNOSIS — Z931 Gastrostomy status: Secondary | ICD-10-CM | POA: Diagnosis not present

## 2021-04-27 DIAGNOSIS — R000 Tachycardia, unspecified: Secondary | ICD-10-CM | POA: Diagnosis not present

## 2021-04-27 DIAGNOSIS — E870 Hyperosmolality and hypernatremia: Secondary | ICD-10-CM | POA: Diagnosis not present

## 2021-04-27 DIAGNOSIS — R451 Restlessness and agitation: Secondary | ICD-10-CM | POA: Diagnosis not present

## 2021-04-27 DIAGNOSIS — G901 Familial dysautonomia [Riley-Day]: Secondary | ICD-10-CM | POA: Diagnosis not present

## 2021-04-27 DIAGNOSIS — B952 Enterococcus as the cause of diseases classified elsewhere: Secondary | ICD-10-CM | POA: Diagnosis not present

## 2021-04-27 DIAGNOSIS — Z930 Tracheostomy status: Secondary | ICD-10-CM | POA: Diagnosis not present

## 2021-04-27 DIAGNOSIS — B1910 Unspecified viral hepatitis B without hepatic coma: Secondary | ICD-10-CM | POA: Diagnosis not present

## 2021-04-28 DIAGNOSIS — B952 Enterococcus as the cause of diseases classified elsewhere: Secondary | ICD-10-CM | POA: Diagnosis not present

## 2021-04-28 DIAGNOSIS — Z9911 Dependence on respirator [ventilator] status: Secondary | ICD-10-CM | POA: Diagnosis not present

## 2021-04-28 DIAGNOSIS — R5081 Fever presenting with conditions classified elsewhere: Secondary | ICD-10-CM | POA: Diagnosis not present

## 2021-04-28 DIAGNOSIS — G901 Familial dysautonomia [Riley-Day]: Secondary | ICD-10-CM | POA: Diagnosis not present

## 2021-04-28 DIAGNOSIS — G0481 Other encephalitis and encephalomyelitis: Secondary | ICD-10-CM | POA: Diagnosis not present

## 2021-04-28 DIAGNOSIS — I10 Essential (primary) hypertension: Secondary | ICD-10-CM | POA: Diagnosis not present

## 2021-04-28 DIAGNOSIS — D649 Anemia, unspecified: Secondary | ICD-10-CM | POA: Diagnosis not present

## 2021-04-28 DIAGNOSIS — R000 Tachycardia, unspecified: Secondary | ICD-10-CM | POA: Diagnosis not present

## 2021-04-28 DIAGNOSIS — R451 Restlessness and agitation: Secondary | ICD-10-CM | POA: Diagnosis not present

## 2021-04-28 DIAGNOSIS — R1319 Other dysphagia: Secondary | ICD-10-CM | POA: Diagnosis not present

## 2021-04-28 DIAGNOSIS — J9611 Chronic respiratory failure with hypoxia: Secondary | ICD-10-CM | POA: Diagnosis not present

## 2021-04-28 DIAGNOSIS — J189 Pneumonia, unspecified organism: Secondary | ICD-10-CM | POA: Diagnosis not present

## 2021-04-28 DIAGNOSIS — R0682 Tachypnea, not elsewhere classified: Secondary | ICD-10-CM | POA: Diagnosis not present

## 2021-04-28 DIAGNOSIS — Z930 Tracheostomy status: Secondary | ICD-10-CM | POA: Diagnosis not present

## 2021-04-28 DIAGNOSIS — G2401 Drug induced subacute dyskinesia: Secondary | ICD-10-CM | POA: Diagnosis not present

## 2021-04-29 DIAGNOSIS — Z9911 Dependence on respirator [ventilator] status: Secondary | ICD-10-CM | POA: Diagnosis not present

## 2021-04-29 DIAGNOSIS — R5081 Fever presenting with conditions classified elsewhere: Secondary | ICD-10-CM | POA: Diagnosis not present

## 2021-04-29 DIAGNOSIS — B952 Enterococcus as the cause of diseases classified elsewhere: Secondary | ICD-10-CM | POA: Diagnosis not present

## 2021-04-29 DIAGNOSIS — R451 Restlessness and agitation: Secondary | ICD-10-CM | POA: Diagnosis not present

## 2021-04-29 DIAGNOSIS — J9611 Chronic respiratory failure with hypoxia: Secondary | ICD-10-CM | POA: Diagnosis not present

## 2021-04-29 DIAGNOSIS — R000 Tachycardia, unspecified: Secondary | ICD-10-CM | POA: Diagnosis not present

## 2021-04-29 DIAGNOSIS — R1319 Other dysphagia: Secondary | ICD-10-CM | POA: Diagnosis not present

## 2021-04-29 DIAGNOSIS — J189 Pneumonia, unspecified organism: Secondary | ICD-10-CM | POA: Diagnosis not present

## 2021-04-29 DIAGNOSIS — I10 Essential (primary) hypertension: Secondary | ICD-10-CM | POA: Diagnosis not present

## 2021-04-29 DIAGNOSIS — Z930 Tracheostomy status: Secondary | ICD-10-CM | POA: Diagnosis not present

## 2021-04-29 DIAGNOSIS — D649 Anemia, unspecified: Secondary | ICD-10-CM | POA: Diagnosis not present

## 2021-04-29 DIAGNOSIS — G901 Familial dysautonomia [Riley-Day]: Secondary | ICD-10-CM | POA: Diagnosis not present

## 2021-04-29 DIAGNOSIS — G249 Dystonia, unspecified: Secondary | ICD-10-CM | POA: Diagnosis not present

## 2021-04-29 DIAGNOSIS — J9610 Chronic respiratory failure, unspecified whether with hypoxia or hypercapnia: Secondary | ICD-10-CM | POA: Diagnosis not present

## 2021-04-29 DIAGNOSIS — G0481 Other encephalitis and encephalomyelitis: Secondary | ICD-10-CM | POA: Diagnosis not present

## 2021-04-29 DIAGNOSIS — R0682 Tachypnea, not elsewhere classified: Secondary | ICD-10-CM | POA: Diagnosis not present

## 2021-04-30 DIAGNOSIS — Z9911 Dependence on respirator [ventilator] status: Secondary | ICD-10-CM | POA: Diagnosis not present

## 2021-04-30 DIAGNOSIS — B1910 Unspecified viral hepatitis B without hepatic coma: Secondary | ICD-10-CM | POA: Diagnosis not present

## 2021-04-30 DIAGNOSIS — R451 Restlessness and agitation: Secondary | ICD-10-CM | POA: Diagnosis not present

## 2021-04-30 DIAGNOSIS — J189 Pneumonia, unspecified organism: Secondary | ICD-10-CM | POA: Diagnosis not present

## 2021-04-30 DIAGNOSIS — J9611 Chronic respiratory failure with hypoxia: Secondary | ICD-10-CM | POA: Diagnosis not present

## 2021-04-30 DIAGNOSIS — R000 Tachycardia, unspecified: Secondary | ICD-10-CM | POA: Diagnosis not present

## 2021-04-30 DIAGNOSIS — Z931 Gastrostomy status: Secondary | ICD-10-CM | POA: Diagnosis not present

## 2021-04-30 DIAGNOSIS — G901 Familial dysautonomia [Riley-Day]: Secondary | ICD-10-CM | POA: Diagnosis not present

## 2021-04-30 DIAGNOSIS — I10 Essential (primary) hypertension: Secondary | ICD-10-CM | POA: Diagnosis not present

## 2021-04-30 DIAGNOSIS — F458 Other somatoform disorders: Secondary | ICD-10-CM | POA: Diagnosis not present

## 2021-04-30 DIAGNOSIS — G249 Dystonia, unspecified: Secondary | ICD-10-CM | POA: Diagnosis not present

## 2021-04-30 DIAGNOSIS — Z930 Tracheostomy status: Secondary | ICD-10-CM | POA: Diagnosis not present

## 2021-04-30 DIAGNOSIS — B952 Enterococcus as the cause of diseases classified elsewhere: Secondary | ICD-10-CM | POA: Diagnosis not present

## 2021-04-30 DIAGNOSIS — R4182 Altered mental status, unspecified: Secondary | ICD-10-CM | POA: Diagnosis not present

## 2021-04-30 DIAGNOSIS — G0481 Other encephalitis and encephalomyelitis: Secondary | ICD-10-CM | POA: Diagnosis not present

## 2021-04-30 DIAGNOSIS — J9610 Chronic respiratory failure, unspecified whether with hypoxia or hypercapnia: Secondary | ICD-10-CM | POA: Diagnosis not present

## 2021-05-01 DIAGNOSIS — B952 Enterococcus as the cause of diseases classified elsewhere: Secondary | ICD-10-CM | POA: Diagnosis not present

## 2021-05-01 DIAGNOSIS — R4182 Altered mental status, unspecified: Secondary | ICD-10-CM | POA: Diagnosis not present

## 2021-05-01 DIAGNOSIS — R451 Restlessness and agitation: Secondary | ICD-10-CM | POA: Diagnosis not present

## 2021-05-01 DIAGNOSIS — J189 Pneumonia, unspecified organism: Secondary | ICD-10-CM | POA: Diagnosis not present

## 2021-05-01 DIAGNOSIS — G0481 Other encephalitis and encephalomyelitis: Secondary | ICD-10-CM | POA: Diagnosis not present

## 2021-05-01 DIAGNOSIS — R000 Tachycardia, unspecified: Secondary | ICD-10-CM | POA: Diagnosis not present

## 2021-05-01 DIAGNOSIS — Z9911 Dependence on respirator [ventilator] status: Secondary | ICD-10-CM | POA: Diagnosis not present

## 2021-05-01 DIAGNOSIS — J9610 Chronic respiratory failure, unspecified whether with hypoxia or hypercapnia: Secondary | ICD-10-CM | POA: Diagnosis not present

## 2021-05-01 DIAGNOSIS — Z930 Tracheostomy status: Secondary | ICD-10-CM | POA: Diagnosis not present

## 2021-05-01 DIAGNOSIS — J9611 Chronic respiratory failure with hypoxia: Secondary | ICD-10-CM | POA: Diagnosis not present

## 2021-05-01 DIAGNOSIS — G901 Familial dysautonomia [Riley-Day]: Secondary | ICD-10-CM | POA: Diagnosis not present

## 2021-05-01 DIAGNOSIS — I10 Essential (primary) hypertension: Secondary | ICD-10-CM | POA: Diagnosis not present

## 2021-05-01 DIAGNOSIS — F458 Other somatoform disorders: Secondary | ICD-10-CM | POA: Diagnosis not present

## 2021-05-01 DIAGNOSIS — B1910 Unspecified viral hepatitis B without hepatic coma: Secondary | ICD-10-CM | POA: Diagnosis not present

## 2021-05-01 DIAGNOSIS — Z931 Gastrostomy status: Secondary | ICD-10-CM | POA: Diagnosis not present

## 2021-05-01 DIAGNOSIS — G249 Dystonia, unspecified: Secondary | ICD-10-CM | POA: Diagnosis not present

## 2021-05-02 DIAGNOSIS — R4182 Altered mental status, unspecified: Secondary | ICD-10-CM | POA: Diagnosis not present

## 2021-05-02 DIAGNOSIS — Z9911 Dependence on respirator [ventilator] status: Secondary | ICD-10-CM | POA: Diagnosis not present

## 2021-05-02 DIAGNOSIS — Z930 Tracheostomy status: Secondary | ICD-10-CM | POA: Diagnosis not present

## 2021-05-02 DIAGNOSIS — R000 Tachycardia, unspecified: Secondary | ICD-10-CM | POA: Diagnosis not present

## 2021-05-02 DIAGNOSIS — J9611 Chronic respiratory failure with hypoxia: Secondary | ICD-10-CM | POA: Diagnosis not present

## 2021-05-02 DIAGNOSIS — R451 Restlessness and agitation: Secondary | ICD-10-CM | POA: Diagnosis not present

## 2021-05-02 DIAGNOSIS — J9610 Chronic respiratory failure, unspecified whether with hypoxia or hypercapnia: Secondary | ICD-10-CM | POA: Diagnosis not present

## 2021-05-02 DIAGNOSIS — G901 Familial dysautonomia [Riley-Day]: Secondary | ICD-10-CM | POA: Diagnosis not present

## 2021-05-02 DIAGNOSIS — B952 Enterococcus as the cause of diseases classified elsewhere: Secondary | ICD-10-CM | POA: Diagnosis not present

## 2021-05-02 DIAGNOSIS — G0481 Other encephalitis and encephalomyelitis: Secondary | ICD-10-CM | POA: Diagnosis not present

## 2021-05-02 DIAGNOSIS — I10 Essential (primary) hypertension: Secondary | ICD-10-CM | POA: Diagnosis not present

## 2021-05-02 DIAGNOSIS — F458 Other somatoform disorders: Secondary | ICD-10-CM | POA: Diagnosis not present

## 2021-05-02 DIAGNOSIS — J189 Pneumonia, unspecified organism: Secondary | ICD-10-CM | POA: Diagnosis not present

## 2021-05-02 DIAGNOSIS — G249 Dystonia, unspecified: Secondary | ICD-10-CM | POA: Diagnosis not present

## 2021-05-02 DIAGNOSIS — Z931 Gastrostomy status: Secondary | ICD-10-CM | POA: Diagnosis not present

## 2021-05-02 DIAGNOSIS — B1910 Unspecified viral hepatitis B without hepatic coma: Secondary | ICD-10-CM | POA: Diagnosis not present

## 2021-05-03 DIAGNOSIS — R059 Cough, unspecified: Secondary | ICD-10-CM | POA: Diagnosis not present

## 2021-05-03 DIAGNOSIS — E063 Autoimmune thyroiditis: Secondary | ICD-10-CM | POA: Diagnosis not present

## 2021-05-03 DIAGNOSIS — J90 Pleural effusion, not elsewhere classified: Secondary | ICD-10-CM | POA: Diagnosis not present

## 2021-05-03 DIAGNOSIS — E039 Hypothyroidism, unspecified: Secondary | ICD-10-CM | POA: Diagnosis not present

## 2021-05-03 DIAGNOSIS — J9501 Hemorrhage from tracheostomy stoma: Secondary | ICD-10-CM | POA: Diagnosis not present

## 2021-05-03 DIAGNOSIS — Z930 Tracheostomy status: Secondary | ICD-10-CM | POA: Diagnosis not present

## 2021-05-03 DIAGNOSIS — I10 Essential (primary) hypertension: Secondary | ICD-10-CM | POA: Diagnosis not present

## 2021-05-03 DIAGNOSIS — F458 Other somatoform disorders: Secondary | ICD-10-CM | POA: Diagnosis not present

## 2021-05-03 DIAGNOSIS — G0481 Other encephalitis and encephalomyelitis: Secondary | ICD-10-CM | POA: Diagnosis not present

## 2021-05-03 DIAGNOSIS — R000 Tachycardia, unspecified: Secondary | ICD-10-CM | POA: Diagnosis not present

## 2021-05-03 DIAGNOSIS — Z9911 Dependence on respirator [ventilator] status: Secondary | ICD-10-CM | POA: Diagnosis not present

## 2021-05-03 DIAGNOSIS — R4182 Altered mental status, unspecified: Secondary | ICD-10-CM | POA: Diagnosis not present

## 2021-05-03 DIAGNOSIS — B1910 Unspecified viral hepatitis B without hepatic coma: Secondary | ICD-10-CM | POA: Diagnosis not present

## 2021-05-03 DIAGNOSIS — R451 Restlessness and agitation: Secondary | ICD-10-CM | POA: Diagnosis not present

## 2021-05-03 DIAGNOSIS — J9611 Chronic respiratory failure with hypoxia: Secondary | ICD-10-CM | POA: Diagnosis not present

## 2021-05-03 DIAGNOSIS — J9621 Acute and chronic respiratory failure with hypoxia: Secondary | ICD-10-CM | POA: Diagnosis not present

## 2021-05-03 DIAGNOSIS — Z931 Gastrostomy status: Secondary | ICD-10-CM | POA: Diagnosis not present

## 2021-05-03 DIAGNOSIS — R918 Other nonspecific abnormal finding of lung field: Secondary | ICD-10-CM | POA: Diagnosis not present

## 2021-05-03 LAB — MTB-RIF NAA WITH AFB CULTURE, SPUTUM: Acid Fast Culture: NEGATIVE

## 2021-05-04 DIAGNOSIS — G9349 Other encephalopathy: Secondary | ICD-10-CM | POA: Diagnosis not present

## 2021-05-04 DIAGNOSIS — Z930 Tracheostomy status: Secondary | ICD-10-CM | POA: Diagnosis not present

## 2021-05-04 DIAGNOSIS — G0481 Other encephalitis and encephalomyelitis: Secondary | ICD-10-CM | POA: Diagnosis not present

## 2021-05-04 DIAGNOSIS — I10 Essential (primary) hypertension: Secondary | ICD-10-CM | POA: Diagnosis not present

## 2021-05-04 DIAGNOSIS — R0689 Other abnormalities of breathing: Secondary | ICD-10-CM | POA: Diagnosis not present

## 2021-05-04 DIAGNOSIS — J9611 Chronic respiratory failure with hypoxia: Secondary | ICD-10-CM | POA: Diagnosis not present

## 2021-05-04 DIAGNOSIS — D8989 Other specified disorders involving the immune mechanism, not elsewhere classified: Secondary | ICD-10-CM | POA: Diagnosis not present

## 2021-05-04 DIAGNOSIS — R5081 Fever presenting with conditions classified elsewhere: Secondary | ICD-10-CM | POA: Diagnosis not present

## 2021-05-04 DIAGNOSIS — R1319 Other dysphagia: Secondary | ICD-10-CM | POA: Diagnosis not present

## 2021-05-04 DIAGNOSIS — R000 Tachycardia, unspecified: Secondary | ICD-10-CM | POA: Diagnosis not present

## 2021-05-04 DIAGNOSIS — Z931 Gastrostomy status: Secondary | ICD-10-CM | POA: Diagnosis not present

## 2021-05-04 LAB — MISC LABCORP TEST (SEND OUT): Labcorp test code: 183510

## 2021-05-05 DIAGNOSIS — D8989 Other specified disorders involving the immune mechanism, not elsewhere classified: Secondary | ICD-10-CM | POA: Diagnosis not present

## 2021-05-05 DIAGNOSIS — B181 Chronic viral hepatitis B without delta-agent: Secondary | ICD-10-CM | POA: Diagnosis not present

## 2021-05-05 DIAGNOSIS — J9600 Acute respiratory failure, unspecified whether with hypoxia or hypercapnia: Secondary | ICD-10-CM | POA: Diagnosis not present

## 2021-05-05 DIAGNOSIS — E039 Hypothyroidism, unspecified: Secondary | ICD-10-CM | POA: Diagnosis not present

## 2021-05-05 DIAGNOSIS — D649 Anemia, unspecified: Secondary | ICD-10-CM | POA: Diagnosis not present

## 2021-05-05 DIAGNOSIS — E063 Autoimmune thyroiditis: Secondary | ICD-10-CM | POA: Diagnosis not present

## 2021-05-05 DIAGNOSIS — I10 Essential (primary) hypertension: Secondary | ICD-10-CM | POA: Diagnosis not present

## 2021-05-05 DIAGNOSIS — G0481 Other encephalitis and encephalomyelitis: Secondary | ICD-10-CM | POA: Diagnosis not present

## 2021-05-05 DIAGNOSIS — R451 Restlessness and agitation: Secondary | ICD-10-CM | POA: Diagnosis not present

## 2021-05-05 DIAGNOSIS — Z930 Tracheostomy status: Secondary | ICD-10-CM | POA: Diagnosis not present

## 2021-05-05 DIAGNOSIS — R000 Tachycardia, unspecified: Secondary | ICD-10-CM | POA: Diagnosis not present

## 2021-05-05 DIAGNOSIS — R918 Other nonspecific abnormal finding of lung field: Secondary | ICD-10-CM | POA: Diagnosis not present

## 2021-05-05 DIAGNOSIS — R5081 Fever presenting with conditions classified elsewhere: Secondary | ICD-10-CM | POA: Diagnosis not present

## 2021-05-06 DIAGNOSIS — G909 Disorder of the autonomic nervous system, unspecified: Secondary | ICD-10-CM | POA: Diagnosis not present

## 2021-05-06 DIAGNOSIS — I10 Essential (primary) hypertension: Secondary | ICD-10-CM | POA: Diagnosis not present

## 2021-05-06 DIAGNOSIS — R451 Restlessness and agitation: Secondary | ICD-10-CM | POA: Diagnosis not present

## 2021-05-06 DIAGNOSIS — R000 Tachycardia, unspecified: Secondary | ICD-10-CM | POA: Diagnosis not present

## 2021-05-06 DIAGNOSIS — Z930 Tracheostomy status: Secondary | ICD-10-CM | POA: Diagnosis not present

## 2021-05-06 DIAGNOSIS — Z931 Gastrostomy status: Secondary | ICD-10-CM | POA: Diagnosis not present

## 2021-05-06 DIAGNOSIS — R1319 Other dysphagia: Secondary | ICD-10-CM | POA: Diagnosis not present

## 2021-05-06 DIAGNOSIS — R5081 Fever presenting with conditions classified elsewhere: Secondary | ICD-10-CM | POA: Diagnosis not present

## 2021-05-06 DIAGNOSIS — B181 Chronic viral hepatitis B without delta-agent: Secondary | ICD-10-CM | POA: Diagnosis not present

## 2021-05-06 DIAGNOSIS — G9349 Other encephalopathy: Secondary | ICD-10-CM | POA: Diagnosis not present

## 2021-05-06 DIAGNOSIS — G0481 Other encephalitis and encephalomyelitis: Secondary | ICD-10-CM | POA: Diagnosis not present

## 2021-05-06 DIAGNOSIS — J9600 Acute respiratory failure, unspecified whether with hypoxia or hypercapnia: Secondary | ICD-10-CM | POA: Diagnosis not present

## 2021-05-07 DIAGNOSIS — E063 Autoimmune thyroiditis: Secondary | ICD-10-CM | POA: Diagnosis not present

## 2021-05-07 DIAGNOSIS — G0481 Other encephalitis and encephalomyelitis: Secondary | ICD-10-CM | POA: Diagnosis not present

## 2021-05-07 DIAGNOSIS — Z7901 Long term (current) use of anticoagulants: Secondary | ICD-10-CM | POA: Diagnosis not present

## 2021-05-07 DIAGNOSIS — R1310 Dysphagia, unspecified: Secondary | ICD-10-CM | POA: Diagnosis not present

## 2021-05-07 DIAGNOSIS — B1910 Unspecified viral hepatitis B without hepatic coma: Secondary | ICD-10-CM | POA: Diagnosis not present

## 2021-05-07 DIAGNOSIS — J9600 Acute respiratory failure, unspecified whether with hypoxia or hypercapnia: Secondary | ICD-10-CM | POA: Diagnosis not present

## 2021-05-07 DIAGNOSIS — R7881 Bacteremia: Secondary | ICD-10-CM | POA: Diagnosis not present

## 2021-05-07 DIAGNOSIS — D649 Anemia, unspecified: Secondary | ICD-10-CM | POA: Diagnosis not present

## 2021-05-07 DIAGNOSIS — E039 Hypothyroidism, unspecified: Secondary | ICD-10-CM | POA: Diagnosis not present

## 2021-05-07 DIAGNOSIS — I4581 Long QT syndrome: Secondary | ICD-10-CM | POA: Diagnosis not present

## 2021-05-07 DIAGNOSIS — R509 Fever, unspecified: Secondary | ICD-10-CM | POA: Diagnosis not present

## 2021-05-07 DIAGNOSIS — R000 Tachycardia, unspecified: Secondary | ICD-10-CM | POA: Diagnosis not present

## 2021-05-07 DIAGNOSIS — I10 Essential (primary) hypertension: Secondary | ICD-10-CM | POA: Diagnosis not present

## 2021-05-08 DIAGNOSIS — E039 Hypothyroidism, unspecified: Secondary | ICD-10-CM | POA: Diagnosis not present

## 2021-05-08 DIAGNOSIS — D649 Anemia, unspecified: Secondary | ICD-10-CM | POA: Diagnosis not present

## 2021-05-08 DIAGNOSIS — J9600 Acute respiratory failure, unspecified whether with hypoxia or hypercapnia: Secondary | ICD-10-CM | POA: Diagnosis not present

## 2021-05-08 DIAGNOSIS — I10 Essential (primary) hypertension: Secondary | ICD-10-CM | POA: Diagnosis not present

## 2021-05-08 DIAGNOSIS — E063 Autoimmune thyroiditis: Secondary | ICD-10-CM | POA: Diagnosis not present

## 2021-05-08 DIAGNOSIS — R1310 Dysphagia, unspecified: Secondary | ICD-10-CM | POA: Diagnosis not present

## 2021-05-08 DIAGNOSIS — R7881 Bacteremia: Secondary | ICD-10-CM | POA: Diagnosis not present

## 2021-05-08 DIAGNOSIS — G0481 Other encephalitis and encephalomyelitis: Secondary | ICD-10-CM | POA: Diagnosis not present

## 2021-05-08 DIAGNOSIS — R509 Fever, unspecified: Secondary | ICD-10-CM | POA: Diagnosis not present

## 2021-05-08 DIAGNOSIS — R000 Tachycardia, unspecified: Secondary | ICD-10-CM | POA: Diagnosis not present

## 2021-05-08 DIAGNOSIS — Z7901 Long term (current) use of anticoagulants: Secondary | ICD-10-CM | POA: Diagnosis not present

## 2021-05-08 DIAGNOSIS — B1910 Unspecified viral hepatitis B without hepatic coma: Secondary | ICD-10-CM | POA: Diagnosis not present

## 2021-05-09 DIAGNOSIS — D649 Anemia, unspecified: Secondary | ICD-10-CM | POA: Diagnosis not present

## 2021-05-09 DIAGNOSIS — E039 Hypothyroidism, unspecified: Secondary | ICD-10-CM | POA: Diagnosis not present

## 2021-05-09 DIAGNOSIS — J9600 Acute respiratory failure, unspecified whether with hypoxia or hypercapnia: Secondary | ICD-10-CM | POA: Diagnosis not present

## 2021-05-09 DIAGNOSIS — E063 Autoimmune thyroiditis: Secondary | ICD-10-CM | POA: Diagnosis not present

## 2021-05-09 DIAGNOSIS — R1310 Dysphagia, unspecified: Secondary | ICD-10-CM | POA: Diagnosis not present

## 2021-05-09 DIAGNOSIS — R509 Fever, unspecified: Secondary | ICD-10-CM | POA: Diagnosis not present

## 2021-05-09 DIAGNOSIS — G0481 Other encephalitis and encephalomyelitis: Secondary | ICD-10-CM | POA: Diagnosis not present

## 2021-05-09 DIAGNOSIS — R7881 Bacteremia: Secondary | ICD-10-CM | POA: Diagnosis not present

## 2021-05-09 DIAGNOSIS — I10 Essential (primary) hypertension: Secondary | ICD-10-CM | POA: Diagnosis not present

## 2021-05-09 DIAGNOSIS — R000 Tachycardia, unspecified: Secondary | ICD-10-CM | POA: Diagnosis not present

## 2021-05-09 DIAGNOSIS — Z7901 Long term (current) use of anticoagulants: Secondary | ICD-10-CM | POA: Diagnosis not present

## 2021-05-09 DIAGNOSIS — B1910 Unspecified viral hepatitis B without hepatic coma: Secondary | ICD-10-CM | POA: Diagnosis not present

## 2021-05-10 DIAGNOSIS — I10 Essential (primary) hypertension: Secondary | ICD-10-CM | POA: Diagnosis not present

## 2021-05-10 DIAGNOSIS — R7881 Bacteremia: Secondary | ICD-10-CM | POA: Diagnosis not present

## 2021-05-10 DIAGNOSIS — E039 Hypothyroidism, unspecified: Secondary | ICD-10-CM | POA: Diagnosis not present

## 2021-05-10 DIAGNOSIS — R1310 Dysphagia, unspecified: Secondary | ICD-10-CM | POA: Diagnosis not present

## 2021-05-10 DIAGNOSIS — R000 Tachycardia, unspecified: Secondary | ICD-10-CM | POA: Diagnosis not present

## 2021-05-10 DIAGNOSIS — J9600 Acute respiratory failure, unspecified whether with hypoxia or hypercapnia: Secondary | ICD-10-CM | POA: Diagnosis not present

## 2021-05-10 DIAGNOSIS — G0481 Other encephalitis and encephalomyelitis: Secondary | ICD-10-CM | POA: Diagnosis not present

## 2021-05-10 DIAGNOSIS — E063 Autoimmune thyroiditis: Secondary | ICD-10-CM | POA: Diagnosis not present

## 2021-05-10 DIAGNOSIS — D649 Anemia, unspecified: Secondary | ICD-10-CM | POA: Diagnosis not present

## 2021-05-10 DIAGNOSIS — Z7901 Long term (current) use of anticoagulants: Secondary | ICD-10-CM | POA: Diagnosis not present

## 2021-05-10 DIAGNOSIS — R509 Fever, unspecified: Secondary | ICD-10-CM | POA: Diagnosis not present

## 2021-05-10 DIAGNOSIS — B1910 Unspecified viral hepatitis B without hepatic coma: Secondary | ICD-10-CM | POA: Diagnosis not present

## 2021-05-11 DIAGNOSIS — R000 Tachycardia, unspecified: Secondary | ICD-10-CM | POA: Diagnosis not present

## 2021-05-11 DIAGNOSIS — R5081 Fever presenting with conditions classified elsewhere: Secondary | ICD-10-CM | POA: Diagnosis not present

## 2021-05-11 DIAGNOSIS — Z931 Gastrostomy status: Secondary | ICD-10-CM | POA: Diagnosis not present

## 2021-05-11 DIAGNOSIS — R451 Restlessness and agitation: Secondary | ICD-10-CM | POA: Diagnosis not present

## 2021-05-11 DIAGNOSIS — Z930 Tracheostomy status: Secondary | ICD-10-CM | POA: Diagnosis not present

## 2021-05-11 DIAGNOSIS — E039 Hypothyroidism, unspecified: Secondary | ICD-10-CM | POA: Diagnosis not present

## 2021-05-11 DIAGNOSIS — G9349 Other encephalopathy: Secondary | ICD-10-CM | POA: Diagnosis not present

## 2021-05-11 DIAGNOSIS — G909 Disorder of the autonomic nervous system, unspecified: Secondary | ICD-10-CM | POA: Diagnosis not present

## 2021-05-11 DIAGNOSIS — Z9911 Dependence on respirator [ventilator] status: Secondary | ICD-10-CM | POA: Diagnosis not present

## 2021-05-11 DIAGNOSIS — J9600 Acute respiratory failure, unspecified whether with hypoxia or hypercapnia: Secondary | ICD-10-CM | POA: Diagnosis not present

## 2021-05-11 DIAGNOSIS — I10 Essential (primary) hypertension: Secondary | ICD-10-CM | POA: Diagnosis not present

## 2021-05-11 DIAGNOSIS — R1319 Other dysphagia: Secondary | ICD-10-CM | POA: Diagnosis not present

## 2021-05-11 DIAGNOSIS — G0481 Other encephalitis and encephalomyelitis: Secondary | ICD-10-CM | POA: Diagnosis not present

## 2021-05-12 DIAGNOSIS — Z931 Gastrostomy status: Secondary | ICD-10-CM | POA: Diagnosis not present

## 2021-05-12 DIAGNOSIS — R1319 Other dysphagia: Secondary | ICD-10-CM | POA: Diagnosis not present

## 2021-05-12 DIAGNOSIS — J9600 Acute respiratory failure, unspecified whether with hypoxia or hypercapnia: Secondary | ICD-10-CM | POA: Diagnosis not present

## 2021-05-12 DIAGNOSIS — Z930 Tracheostomy status: Secondary | ICD-10-CM | POA: Diagnosis not present

## 2021-05-12 DIAGNOSIS — I10 Essential (primary) hypertension: Secondary | ICD-10-CM | POA: Diagnosis not present

## 2021-05-12 DIAGNOSIS — G909 Disorder of the autonomic nervous system, unspecified: Secondary | ICD-10-CM | POA: Diagnosis not present

## 2021-05-12 DIAGNOSIS — Z9911 Dependence on respirator [ventilator] status: Secondary | ICD-10-CM | POA: Diagnosis not present

## 2021-05-12 DIAGNOSIS — R5081 Fever presenting with conditions classified elsewhere: Secondary | ICD-10-CM | POA: Diagnosis not present

## 2021-05-12 DIAGNOSIS — G0481 Other encephalitis and encephalomyelitis: Secondary | ICD-10-CM | POA: Diagnosis not present

## 2021-05-12 DIAGNOSIS — R000 Tachycardia, unspecified: Secondary | ICD-10-CM | POA: Diagnosis not present

## 2021-05-12 DIAGNOSIS — G9349 Other encephalopathy: Secondary | ICD-10-CM | POA: Diagnosis not present

## 2021-05-12 DIAGNOSIS — R451 Restlessness and agitation: Secondary | ICD-10-CM | POA: Diagnosis not present

## 2021-05-13 DIAGNOSIS — R1319 Other dysphagia: Secondary | ICD-10-CM | POA: Diagnosis not present

## 2021-05-13 DIAGNOSIS — I10 Essential (primary) hypertension: Secondary | ICD-10-CM | POA: Diagnosis not present

## 2021-05-13 DIAGNOSIS — G0481 Other encephalitis and encephalomyelitis: Secondary | ICD-10-CM | POA: Diagnosis not present

## 2021-05-13 DIAGNOSIS — R5081 Fever presenting with conditions classified elsewhere: Secondary | ICD-10-CM | POA: Diagnosis not present

## 2021-05-13 DIAGNOSIS — J9600 Acute respiratory failure, unspecified whether with hypoxia or hypercapnia: Secondary | ICD-10-CM | POA: Diagnosis not present

## 2021-05-13 DIAGNOSIS — Z930 Tracheostomy status: Secondary | ICD-10-CM | POA: Diagnosis not present

## 2021-05-13 DIAGNOSIS — Z9911 Dependence on respirator [ventilator] status: Secondary | ICD-10-CM | POA: Diagnosis not present

## 2021-05-13 DIAGNOSIS — R451 Restlessness and agitation: Secondary | ICD-10-CM | POA: Diagnosis not present

## 2021-05-13 DIAGNOSIS — G909 Disorder of the autonomic nervous system, unspecified: Secondary | ICD-10-CM | POA: Diagnosis not present

## 2021-05-13 DIAGNOSIS — G9349 Other encephalopathy: Secondary | ICD-10-CM | POA: Diagnosis not present

## 2021-05-13 DIAGNOSIS — R000 Tachycardia, unspecified: Secondary | ICD-10-CM | POA: Diagnosis not present

## 2021-05-13 DIAGNOSIS — Z931 Gastrostomy status: Secondary | ICD-10-CM | POA: Diagnosis not present

## 2021-05-14 DIAGNOSIS — J9600 Acute respiratory failure, unspecified whether with hypoxia or hypercapnia: Secondary | ICD-10-CM | POA: Diagnosis not present

## 2021-05-14 DIAGNOSIS — D649 Anemia, unspecified: Secondary | ICD-10-CM | POA: Diagnosis not present

## 2021-05-14 DIAGNOSIS — R1319 Other dysphagia: Secondary | ICD-10-CM | POA: Diagnosis not present

## 2021-05-14 DIAGNOSIS — I10 Essential (primary) hypertension: Secondary | ICD-10-CM | POA: Diagnosis not present

## 2021-05-14 DIAGNOSIS — G0481 Other encephalitis and encephalomyelitis: Secondary | ICD-10-CM | POA: Diagnosis not present

## 2021-05-14 DIAGNOSIS — B181 Chronic viral hepatitis B without delta-agent: Secondary | ICD-10-CM | POA: Diagnosis not present

## 2021-05-14 DIAGNOSIS — E039 Hypothyroidism, unspecified: Secondary | ICD-10-CM | POA: Diagnosis not present

## 2021-05-14 DIAGNOSIS — Z9911 Dependence on respirator [ventilator] status: Secondary | ICD-10-CM | POA: Diagnosis not present

## 2021-05-14 DIAGNOSIS — R5081 Fever presenting with conditions classified elsewhere: Secondary | ICD-10-CM | POA: Diagnosis not present

## 2021-05-14 DIAGNOSIS — G909 Disorder of the autonomic nervous system, unspecified: Secondary | ICD-10-CM | POA: Diagnosis not present

## 2021-05-14 DIAGNOSIS — R000 Tachycardia, unspecified: Secondary | ICD-10-CM | POA: Diagnosis not present

## 2021-05-15 DIAGNOSIS — R000 Tachycardia, unspecified: Secondary | ICD-10-CM | POA: Diagnosis not present

## 2021-05-15 DIAGNOSIS — R5081 Fever presenting with conditions classified elsewhere: Secondary | ICD-10-CM | POA: Diagnosis not present

## 2021-05-15 DIAGNOSIS — J9600 Acute respiratory failure, unspecified whether with hypoxia or hypercapnia: Secondary | ICD-10-CM | POA: Diagnosis not present

## 2021-05-15 DIAGNOSIS — G0481 Other encephalitis and encephalomyelitis: Secondary | ICD-10-CM | POA: Diagnosis not present

## 2021-05-15 DIAGNOSIS — Z9911 Dependence on respirator [ventilator] status: Secondary | ICD-10-CM | POA: Diagnosis not present

## 2021-05-15 DIAGNOSIS — E039 Hypothyroidism, unspecified: Secondary | ICD-10-CM | POA: Diagnosis not present

## 2021-05-15 DIAGNOSIS — R1319 Other dysphagia: Secondary | ICD-10-CM | POA: Diagnosis not present

## 2021-05-15 DIAGNOSIS — D649 Anemia, unspecified: Secondary | ICD-10-CM | POA: Diagnosis not present

## 2021-05-15 DIAGNOSIS — I10 Essential (primary) hypertension: Secondary | ICD-10-CM | POA: Diagnosis not present

## 2021-05-15 DIAGNOSIS — G909 Disorder of the autonomic nervous system, unspecified: Secondary | ICD-10-CM | POA: Diagnosis not present

## 2021-05-15 DIAGNOSIS — B181 Chronic viral hepatitis B without delta-agent: Secondary | ICD-10-CM | POA: Diagnosis not present

## 2021-05-16 DIAGNOSIS — Z9911 Dependence on respirator [ventilator] status: Secondary | ICD-10-CM | POA: Diagnosis not present

## 2021-05-16 DIAGNOSIS — R000 Tachycardia, unspecified: Secondary | ICD-10-CM | POA: Diagnosis not present

## 2021-05-16 DIAGNOSIS — E039 Hypothyroidism, unspecified: Secondary | ICD-10-CM | POA: Diagnosis not present

## 2021-05-16 DIAGNOSIS — D649 Anemia, unspecified: Secondary | ICD-10-CM | POA: Diagnosis not present

## 2021-05-16 DIAGNOSIS — J9600 Acute respiratory failure, unspecified whether with hypoxia or hypercapnia: Secondary | ICD-10-CM | POA: Diagnosis not present

## 2021-05-16 DIAGNOSIS — G0481 Other encephalitis and encephalomyelitis: Secondary | ICD-10-CM | POA: Diagnosis not present

## 2021-05-16 DIAGNOSIS — I10 Essential (primary) hypertension: Secondary | ICD-10-CM | POA: Diagnosis not present

## 2021-05-16 DIAGNOSIS — R5081 Fever presenting with conditions classified elsewhere: Secondary | ICD-10-CM | POA: Diagnosis not present

## 2021-05-16 DIAGNOSIS — Z978 Presence of other specified devices: Secondary | ICD-10-CM | POA: Diagnosis not present

## 2021-05-16 DIAGNOSIS — R918 Other nonspecific abnormal finding of lung field: Secondary | ICD-10-CM | POA: Diagnosis not present

## 2021-05-16 DIAGNOSIS — R1319 Other dysphagia: Secondary | ICD-10-CM | POA: Diagnosis not present

## 2021-05-16 DIAGNOSIS — G909 Disorder of the autonomic nervous system, unspecified: Secondary | ICD-10-CM | POA: Diagnosis not present

## 2021-05-16 DIAGNOSIS — B181 Chronic viral hepatitis B without delta-agent: Secondary | ICD-10-CM | POA: Diagnosis not present

## 2021-05-17 DIAGNOSIS — G0481 Other encephalitis and encephalomyelitis: Secondary | ICD-10-CM | POA: Diagnosis not present

## 2021-05-17 DIAGNOSIS — R000 Tachycardia, unspecified: Secondary | ICD-10-CM | POA: Diagnosis not present

## 2021-05-17 DIAGNOSIS — Z930 Tracheostomy status: Secondary | ICD-10-CM | POA: Diagnosis not present

## 2021-05-17 DIAGNOSIS — G909 Disorder of the autonomic nervous system, unspecified: Secondary | ICD-10-CM | POA: Diagnosis not present

## 2021-05-17 DIAGNOSIS — G9349 Other encephalopathy: Secondary | ICD-10-CM | POA: Diagnosis not present

## 2021-05-17 DIAGNOSIS — J9600 Acute respiratory failure, unspecified whether with hypoxia or hypercapnia: Secondary | ICD-10-CM | POA: Diagnosis not present

## 2021-05-17 DIAGNOSIS — I10 Essential (primary) hypertension: Secondary | ICD-10-CM | POA: Diagnosis not present

## 2021-05-17 DIAGNOSIS — R5081 Fever presenting with conditions classified elsewhere: Secondary | ICD-10-CM | POA: Diagnosis not present

## 2021-05-17 DIAGNOSIS — Z9911 Dependence on respirator [ventilator] status: Secondary | ICD-10-CM | POA: Diagnosis not present

## 2021-05-17 DIAGNOSIS — J95851 Ventilator associated pneumonia: Secondary | ICD-10-CM | POA: Diagnosis not present

## 2021-05-17 DIAGNOSIS — B9620 Unspecified Escherichia coli [E. coli] as the cause of diseases classified elsewhere: Secondary | ICD-10-CM | POA: Diagnosis not present

## 2021-05-17 DIAGNOSIS — R451 Restlessness and agitation: Secondary | ICD-10-CM | POA: Diagnosis not present

## 2021-05-18 DIAGNOSIS — G909 Disorder of the autonomic nervous system, unspecified: Secondary | ICD-10-CM | POA: Diagnosis not present

## 2021-05-18 DIAGNOSIS — J9600 Acute respiratory failure, unspecified whether with hypoxia or hypercapnia: Secondary | ICD-10-CM | POA: Diagnosis not present

## 2021-05-18 DIAGNOSIS — Z931 Gastrostomy status: Secondary | ICD-10-CM | POA: Diagnosis not present

## 2021-05-18 DIAGNOSIS — B181 Chronic viral hepatitis B without delta-agent: Secondary | ICD-10-CM | POA: Diagnosis not present

## 2021-05-18 DIAGNOSIS — G9349 Other encephalopathy: Secondary | ICD-10-CM | POA: Diagnosis not present

## 2021-05-18 DIAGNOSIS — R000 Tachycardia, unspecified: Secondary | ICD-10-CM | POA: Diagnosis not present

## 2021-05-18 DIAGNOSIS — R451 Restlessness and agitation: Secondary | ICD-10-CM | POA: Diagnosis not present

## 2021-05-18 DIAGNOSIS — I10 Essential (primary) hypertension: Secondary | ICD-10-CM | POA: Diagnosis not present

## 2021-05-18 DIAGNOSIS — G0481 Other encephalitis and encephalomyelitis: Secondary | ICD-10-CM | POA: Diagnosis not present

## 2021-05-18 DIAGNOSIS — Z930 Tracheostomy status: Secondary | ICD-10-CM | POA: Diagnosis not present

## 2021-05-18 DIAGNOSIS — R1319 Other dysphagia: Secondary | ICD-10-CM | POA: Diagnosis not present

## 2021-05-18 DIAGNOSIS — Z9911 Dependence on respirator [ventilator] status: Secondary | ICD-10-CM | POA: Diagnosis not present

## 2021-05-19 DIAGNOSIS — R1319 Other dysphagia: Secondary | ICD-10-CM | POA: Diagnosis not present

## 2021-05-19 DIAGNOSIS — R5081 Fever presenting with conditions classified elsewhere: Secondary | ICD-10-CM | POA: Diagnosis not present

## 2021-05-19 DIAGNOSIS — Z930 Tracheostomy status: Secondary | ICD-10-CM | POA: Diagnosis not present

## 2021-05-19 DIAGNOSIS — I10 Essential (primary) hypertension: Secondary | ICD-10-CM | POA: Diagnosis not present

## 2021-05-19 DIAGNOSIS — R000 Tachycardia, unspecified: Secondary | ICD-10-CM | POA: Diagnosis not present

## 2021-05-19 DIAGNOSIS — Z0489 Encounter for examination and observation for other specified reasons: Secondary | ICD-10-CM | POA: Diagnosis not present

## 2021-05-19 DIAGNOSIS — R451 Restlessness and agitation: Secondary | ICD-10-CM | POA: Diagnosis not present

## 2021-05-19 DIAGNOSIS — Z931 Gastrostomy status: Secondary | ICD-10-CM | POA: Diagnosis not present

## 2021-05-19 DIAGNOSIS — J9600 Acute respiratory failure, unspecified whether with hypoxia or hypercapnia: Secondary | ICD-10-CM | POA: Diagnosis not present

## 2021-05-19 DIAGNOSIS — J151 Pneumonia due to Pseudomonas: Secondary | ICD-10-CM | POA: Diagnosis not present

## 2021-05-19 DIAGNOSIS — G0481 Other encephalitis and encephalomyelitis: Secondary | ICD-10-CM | POA: Diagnosis not present

## 2021-05-19 DIAGNOSIS — B181 Chronic viral hepatitis B without delta-agent: Secondary | ICD-10-CM | POA: Diagnosis not present

## 2021-05-19 DIAGNOSIS — G909 Disorder of the autonomic nervous system, unspecified: Secondary | ICD-10-CM | POA: Diagnosis not present

## 2021-05-20 DIAGNOSIS — R5081 Fever presenting with conditions classified elsewhere: Secondary | ICD-10-CM | POA: Diagnosis not present

## 2021-05-20 DIAGNOSIS — I10 Essential (primary) hypertension: Secondary | ICD-10-CM | POA: Diagnosis not present

## 2021-05-20 DIAGNOSIS — R000 Tachycardia, unspecified: Secondary | ICD-10-CM | POA: Diagnosis not present

## 2021-05-20 DIAGNOSIS — Z930 Tracheostomy status: Secondary | ICD-10-CM | POA: Diagnosis not present

## 2021-05-20 DIAGNOSIS — Z931 Gastrostomy status: Secondary | ICD-10-CM | POA: Diagnosis not present

## 2021-05-20 DIAGNOSIS — G909 Disorder of the autonomic nervous system, unspecified: Secondary | ICD-10-CM | POA: Diagnosis not present

## 2021-05-20 DIAGNOSIS — R1319 Other dysphagia: Secondary | ICD-10-CM | POA: Diagnosis not present

## 2021-05-20 DIAGNOSIS — J151 Pneumonia due to Pseudomonas: Secondary | ICD-10-CM | POA: Diagnosis not present

## 2021-05-20 DIAGNOSIS — Z9911 Dependence on respirator [ventilator] status: Secondary | ICD-10-CM | POA: Diagnosis not present

## 2021-05-20 DIAGNOSIS — N3289 Other specified disorders of bladder: Secondary | ICD-10-CM | POA: Diagnosis not present

## 2021-05-20 DIAGNOSIS — R451 Restlessness and agitation: Secondary | ICD-10-CM | POA: Diagnosis not present

## 2021-05-20 DIAGNOSIS — G0481 Other encephalitis and encephalomyelitis: Secondary | ICD-10-CM | POA: Diagnosis not present

## 2021-05-20 DIAGNOSIS — J9600 Acute respiratory failure, unspecified whether with hypoxia or hypercapnia: Secondary | ICD-10-CM | POA: Diagnosis not present

## 2021-05-20 DIAGNOSIS — G0490 Encephalitis and encephalomyelitis, unspecified: Secondary | ICD-10-CM | POA: Diagnosis not present

## 2021-05-21 DIAGNOSIS — G909 Disorder of the autonomic nervous system, unspecified: Secondary | ICD-10-CM | POA: Diagnosis not present

## 2021-05-21 DIAGNOSIS — G0481 Other encephalitis and encephalomyelitis: Secondary | ICD-10-CM | POA: Diagnosis not present

## 2021-05-21 DIAGNOSIS — Z930 Tracheostomy status: Secondary | ICD-10-CM | POA: Diagnosis not present

## 2021-05-21 DIAGNOSIS — J151 Pneumonia due to Pseudomonas: Secondary | ICD-10-CM | POA: Diagnosis not present

## 2021-05-21 DIAGNOSIS — Z931 Gastrostomy status: Secondary | ICD-10-CM | POA: Diagnosis not present

## 2021-05-21 DIAGNOSIS — E063 Autoimmune thyroiditis: Secondary | ICD-10-CM | POA: Diagnosis not present

## 2021-05-21 DIAGNOSIS — B181 Chronic viral hepatitis B without delta-agent: Secondary | ICD-10-CM | POA: Diagnosis not present

## 2021-05-21 DIAGNOSIS — Z9911 Dependence on respirator [ventilator] status: Secondary | ICD-10-CM | POA: Diagnosis not present

## 2021-05-21 DIAGNOSIS — E039 Hypothyroidism, unspecified: Secondary | ICD-10-CM | POA: Diagnosis not present

## 2021-05-21 DIAGNOSIS — J9600 Acute respiratory failure, unspecified whether with hypoxia or hypercapnia: Secondary | ICD-10-CM | POA: Diagnosis not present

## 2021-05-21 DIAGNOSIS — D649 Anemia, unspecified: Secondary | ICD-10-CM | POA: Diagnosis not present

## 2021-05-21 DIAGNOSIS — R1319 Other dysphagia: Secondary | ICD-10-CM | POA: Diagnosis not present

## 2021-05-22 DIAGNOSIS — G0481 Other encephalitis and encephalomyelitis: Secondary | ICD-10-CM | POA: Diagnosis not present

## 2021-05-22 DIAGNOSIS — Z9911 Dependence on respirator [ventilator] status: Secondary | ICD-10-CM | POA: Diagnosis not present

## 2021-05-22 DIAGNOSIS — Z931 Gastrostomy status: Secondary | ICD-10-CM | POA: Diagnosis not present

## 2021-05-22 DIAGNOSIS — E039 Hypothyroidism, unspecified: Secondary | ICD-10-CM | POA: Diagnosis not present

## 2021-05-22 DIAGNOSIS — R1319 Other dysphagia: Secondary | ICD-10-CM | POA: Diagnosis not present

## 2021-05-22 DIAGNOSIS — J9600 Acute respiratory failure, unspecified whether with hypoxia or hypercapnia: Secondary | ICD-10-CM | POA: Diagnosis not present

## 2021-05-22 DIAGNOSIS — D649 Anemia, unspecified: Secondary | ICD-10-CM | POA: Diagnosis not present

## 2021-05-22 DIAGNOSIS — Z930 Tracheostomy status: Secondary | ICD-10-CM | POA: Diagnosis not present

## 2021-05-22 DIAGNOSIS — J151 Pneumonia due to Pseudomonas: Secondary | ICD-10-CM | POA: Diagnosis not present

## 2021-05-22 DIAGNOSIS — E063 Autoimmune thyroiditis: Secondary | ICD-10-CM | POA: Diagnosis not present

## 2021-05-22 DIAGNOSIS — B181 Chronic viral hepatitis B without delta-agent: Secondary | ICD-10-CM | POA: Diagnosis not present

## 2021-05-22 DIAGNOSIS — G909 Disorder of the autonomic nervous system, unspecified: Secondary | ICD-10-CM | POA: Diagnosis not present

## 2021-05-23 DIAGNOSIS — J9600 Acute respiratory failure, unspecified whether with hypoxia or hypercapnia: Secondary | ICD-10-CM | POA: Diagnosis not present

## 2021-05-23 DIAGNOSIS — R1319 Other dysphagia: Secondary | ICD-10-CM | POA: Diagnosis not present

## 2021-05-23 DIAGNOSIS — Z930 Tracheostomy status: Secondary | ICD-10-CM | POA: Diagnosis not present

## 2021-05-23 DIAGNOSIS — Z9911 Dependence on respirator [ventilator] status: Secondary | ICD-10-CM | POA: Diagnosis not present

## 2021-05-23 DIAGNOSIS — G909 Disorder of the autonomic nervous system, unspecified: Secondary | ICD-10-CM | POA: Diagnosis not present

## 2021-05-23 DIAGNOSIS — B181 Chronic viral hepatitis B without delta-agent: Secondary | ICD-10-CM | POA: Diagnosis not present

## 2021-05-23 DIAGNOSIS — J151 Pneumonia due to Pseudomonas: Secondary | ICD-10-CM | POA: Diagnosis not present

## 2021-05-23 DIAGNOSIS — Z931 Gastrostomy status: Secondary | ICD-10-CM | POA: Diagnosis not present

## 2021-05-23 DIAGNOSIS — G0481 Other encephalitis and encephalomyelitis: Secondary | ICD-10-CM | POA: Diagnosis not present

## 2021-05-23 DIAGNOSIS — D649 Anemia, unspecified: Secondary | ICD-10-CM | POA: Diagnosis not present

## 2021-05-23 DIAGNOSIS — E063 Autoimmune thyroiditis: Secondary | ICD-10-CM | POA: Diagnosis not present

## 2021-05-23 DIAGNOSIS — E039 Hypothyroidism, unspecified: Secondary | ICD-10-CM | POA: Diagnosis not present

## 2021-05-24 DIAGNOSIS — J151 Pneumonia due to Pseudomonas: Secondary | ICD-10-CM | POA: Diagnosis not present

## 2021-05-24 DIAGNOSIS — Z930 Tracheostomy status: Secondary | ICD-10-CM | POA: Diagnosis not present

## 2021-05-24 DIAGNOSIS — I10 Essential (primary) hypertension: Secondary | ICD-10-CM | POA: Diagnosis not present

## 2021-05-24 DIAGNOSIS — G9349 Other encephalopathy: Secondary | ICD-10-CM | POA: Diagnosis not present

## 2021-05-24 DIAGNOSIS — J9600 Acute respiratory failure, unspecified whether with hypoxia or hypercapnia: Secondary | ICD-10-CM | POA: Diagnosis not present

## 2021-05-24 DIAGNOSIS — R5081 Fever presenting with conditions classified elsewhere: Secondary | ICD-10-CM | POA: Diagnosis not present

## 2021-05-24 DIAGNOSIS — R451 Restlessness and agitation: Secondary | ICD-10-CM | POA: Diagnosis not present

## 2021-05-24 DIAGNOSIS — Z9911 Dependence on respirator [ventilator] status: Secondary | ICD-10-CM | POA: Diagnosis not present

## 2021-05-24 DIAGNOSIS — G0481 Other encephalitis and encephalomyelitis: Secondary | ICD-10-CM | POA: Diagnosis not present

## 2021-05-24 DIAGNOSIS — R000 Tachycardia, unspecified: Secondary | ICD-10-CM | POA: Diagnosis not present

## 2021-05-24 DIAGNOSIS — G909 Disorder of the autonomic nervous system, unspecified: Secondary | ICD-10-CM | POA: Diagnosis not present

## 2021-05-24 DIAGNOSIS — E221 Hyperprolactinemia: Secondary | ICD-10-CM | POA: Diagnosis not present

## 2021-05-25 DIAGNOSIS — G909 Disorder of the autonomic nervous system, unspecified: Secondary | ICD-10-CM | POA: Diagnosis not present

## 2021-05-25 DIAGNOSIS — R000 Tachycardia, unspecified: Secondary | ICD-10-CM | POA: Diagnosis not present

## 2021-05-25 DIAGNOSIS — G0490 Encephalitis and encephalomyelitis, unspecified: Secondary | ICD-10-CM | POA: Diagnosis not present

## 2021-05-25 DIAGNOSIS — G0481 Other encephalitis and encephalomyelitis: Secondary | ICD-10-CM | POA: Diagnosis not present

## 2021-05-25 DIAGNOSIS — J9601 Acute respiratory failure with hypoxia: Secondary | ICD-10-CM | POA: Diagnosis not present

## 2021-05-25 DIAGNOSIS — D72829 Elevated white blood cell count, unspecified: Secondary | ICD-10-CM | POA: Diagnosis not present

## 2021-05-25 DIAGNOSIS — J95851 Ventilator associated pneumonia: Secondary | ICD-10-CM | POA: Diagnosis not present

## 2021-05-25 DIAGNOSIS — E221 Hyperprolactinemia: Secondary | ICD-10-CM | POA: Diagnosis not present

## 2021-05-25 DIAGNOSIS — R569 Unspecified convulsions: Secondary | ICD-10-CM | POA: Diagnosis not present

## 2021-05-25 DIAGNOSIS — J151 Pneumonia due to Pseudomonas: Secondary | ICD-10-CM | POA: Diagnosis not present

## 2021-05-25 DIAGNOSIS — B9620 Unspecified Escherichia coli [E. coli] as the cause of diseases classified elsewhere: Secondary | ICD-10-CM | POA: Diagnosis not present

## 2021-05-25 DIAGNOSIS — Y95 Nosocomial condition: Secondary | ICD-10-CM | POA: Diagnosis not present

## 2021-05-25 DIAGNOSIS — R5081 Fever presenting with conditions classified elsewhere: Secondary | ICD-10-CM | POA: Diagnosis not present

## 2021-05-26 DIAGNOSIS — G0481 Other encephalitis and encephalomyelitis: Secondary | ICD-10-CM | POA: Diagnosis not present

## 2021-05-26 DIAGNOSIS — R569 Unspecified convulsions: Secondary | ICD-10-CM | POA: Diagnosis not present

## 2021-05-26 DIAGNOSIS — E221 Hyperprolactinemia: Secondary | ICD-10-CM | POA: Diagnosis not present

## 2021-05-26 DIAGNOSIS — J9600 Acute respiratory failure, unspecified whether with hypoxia or hypercapnia: Secondary | ICD-10-CM | POA: Diagnosis not present

## 2021-05-26 DIAGNOSIS — G909 Disorder of the autonomic nervous system, unspecified: Secondary | ICD-10-CM | POA: Diagnosis not present

## 2021-05-26 DIAGNOSIS — D487 Neoplasm of uncertain behavior of other specified sites: Secondary | ICD-10-CM | POA: Diagnosis not present

## 2021-05-26 DIAGNOSIS — Z930 Tracheostomy status: Secondary | ICD-10-CM | POA: Diagnosis not present

## 2021-05-26 DIAGNOSIS — R1319 Other dysphagia: Secondary | ICD-10-CM | POA: Diagnosis not present

## 2021-05-26 DIAGNOSIS — Z9911 Dependence on respirator [ventilator] status: Secondary | ICD-10-CM | POA: Diagnosis not present

## 2021-05-26 DIAGNOSIS — G249 Dystonia, unspecified: Secondary | ICD-10-CM | POA: Diagnosis not present

## 2021-05-26 DIAGNOSIS — J151 Pneumonia due to Pseudomonas: Secondary | ICD-10-CM | POA: Diagnosis not present

## 2021-05-26 DIAGNOSIS — E039 Hypothyroidism, unspecified: Secondary | ICD-10-CM | POA: Diagnosis not present

## 2021-05-27 DIAGNOSIS — R569 Unspecified convulsions: Secondary | ICD-10-CM | POA: Diagnosis not present

## 2021-05-27 DIAGNOSIS — Z930 Tracheostomy status: Secondary | ICD-10-CM | POA: Diagnosis not present

## 2021-05-27 DIAGNOSIS — R918 Other nonspecific abnormal finding of lung field: Secondary | ICD-10-CM | POA: Diagnosis not present

## 2021-05-27 DIAGNOSIS — G0481 Other encephalitis and encephalomyelitis: Secondary | ICD-10-CM | POA: Diagnosis not present

## 2021-05-27 DIAGNOSIS — D72829 Elevated white blood cell count, unspecified: Secondary | ICD-10-CM | POA: Diagnosis not present

## 2021-05-27 DIAGNOSIS — J151 Pneumonia due to Pseudomonas: Secondary | ICD-10-CM | POA: Diagnosis not present

## 2021-05-27 DIAGNOSIS — G909 Disorder of the autonomic nervous system, unspecified: Secondary | ICD-10-CM | POA: Diagnosis not present

## 2021-05-27 DIAGNOSIS — R509 Fever, unspecified: Secondary | ICD-10-CM | POA: Diagnosis not present

## 2021-05-27 DIAGNOSIS — G249 Dystonia, unspecified: Secondary | ICD-10-CM | POA: Diagnosis not present

## 2021-05-27 DIAGNOSIS — I10 Essential (primary) hypertension: Secondary | ICD-10-CM | POA: Diagnosis not present

## 2021-05-27 DIAGNOSIS — Z9911 Dependence on respirator [ventilator] status: Secondary | ICD-10-CM | POA: Diagnosis not present

## 2021-05-27 DIAGNOSIS — R5081 Fever presenting with conditions classified elsewhere: Secondary | ICD-10-CM | POA: Diagnosis not present

## 2021-05-27 DIAGNOSIS — E221 Hyperprolactinemia: Secondary | ICD-10-CM | POA: Diagnosis not present

## 2021-05-27 DIAGNOSIS — R000 Tachycardia, unspecified: Secondary | ICD-10-CM | POA: Diagnosis not present

## 2021-05-27 DIAGNOSIS — J9600 Acute respiratory failure, unspecified whether with hypoxia or hypercapnia: Secondary | ICD-10-CM | POA: Diagnosis not present

## 2021-05-27 DIAGNOSIS — R1319 Other dysphagia: Secondary | ICD-10-CM | POA: Diagnosis not present

## 2021-05-28 DIAGNOSIS — R509 Fever, unspecified: Secondary | ICD-10-CM | POA: Diagnosis not present

## 2021-05-28 DIAGNOSIS — E221 Hyperprolactinemia: Secondary | ICD-10-CM | POA: Diagnosis not present

## 2021-05-28 DIAGNOSIS — R8279 Other abnormal findings on microbiological examination of urine: Secondary | ICD-10-CM | POA: Diagnosis not present

## 2021-05-28 DIAGNOSIS — G249 Dystonia, unspecified: Secondary | ICD-10-CM | POA: Diagnosis not present

## 2021-05-28 DIAGNOSIS — I10 Essential (primary) hypertension: Secondary | ICD-10-CM | POA: Diagnosis not present

## 2021-05-28 DIAGNOSIS — D72829 Elevated white blood cell count, unspecified: Secondary | ICD-10-CM | POA: Diagnosis not present

## 2021-05-28 DIAGNOSIS — D849 Immunodeficiency, unspecified: Secondary | ICD-10-CM | POA: Diagnosis not present

## 2021-05-28 DIAGNOSIS — E063 Autoimmune thyroiditis: Secondary | ICD-10-CM | POA: Diagnosis not present

## 2021-05-28 DIAGNOSIS — J9600 Acute respiratory failure, unspecified whether with hypoxia or hypercapnia: Secondary | ICD-10-CM | POA: Diagnosis not present

## 2021-05-28 DIAGNOSIS — G909 Disorder of the autonomic nervous system, unspecified: Secondary | ICD-10-CM | POA: Diagnosis not present

## 2021-05-28 DIAGNOSIS — R000 Tachycardia, unspecified: Secondary | ICD-10-CM | POA: Diagnosis not present

## 2021-05-28 DIAGNOSIS — R569 Unspecified convulsions: Secondary | ICD-10-CM | POA: Diagnosis not present

## 2021-05-28 DIAGNOSIS — R768 Other specified abnormal immunological findings in serum: Secondary | ICD-10-CM | POA: Diagnosis not present

## 2021-05-28 DIAGNOSIS — R5081 Fever presenting with conditions classified elsewhere: Secondary | ICD-10-CM | POA: Diagnosis not present

## 2021-05-28 DIAGNOSIS — J151 Pneumonia due to Pseudomonas: Secondary | ICD-10-CM | POA: Diagnosis not present

## 2021-05-28 DIAGNOSIS — Z930 Tracheostomy status: Secondary | ICD-10-CM | POA: Diagnosis not present

## 2021-05-28 DIAGNOSIS — B965 Pseudomonas (aeruginosa) (mallei) (pseudomallei) as the cause of diseases classified elsewhere: Secondary | ICD-10-CM | POA: Diagnosis not present

## 2021-05-28 DIAGNOSIS — Z931 Gastrostomy status: Secondary | ICD-10-CM | POA: Diagnosis not present

## 2021-05-28 DIAGNOSIS — G0481 Other encephalitis and encephalomyelitis: Secondary | ICD-10-CM | POA: Diagnosis not present

## 2021-05-29 DIAGNOSIS — J9600 Acute respiratory failure, unspecified whether with hypoxia or hypercapnia: Secondary | ICD-10-CM | POA: Diagnosis not present

## 2021-05-29 DIAGNOSIS — R569 Unspecified convulsions: Secondary | ICD-10-CM | POA: Diagnosis not present

## 2021-05-29 DIAGNOSIS — E221 Hyperprolactinemia: Secondary | ICD-10-CM | POA: Diagnosis not present

## 2021-05-29 DIAGNOSIS — J151 Pneumonia due to Pseudomonas: Secondary | ICD-10-CM | POA: Diagnosis not present

## 2021-05-29 DIAGNOSIS — R000 Tachycardia, unspecified: Secondary | ICD-10-CM | POA: Diagnosis not present

## 2021-05-29 DIAGNOSIS — G0481 Other encephalitis and encephalomyelitis: Secondary | ICD-10-CM | POA: Diagnosis not present

## 2021-05-29 DIAGNOSIS — B965 Pseudomonas (aeruginosa) (mallei) (pseudomallei) as the cause of diseases classified elsewhere: Secondary | ICD-10-CM | POA: Diagnosis not present

## 2021-05-29 DIAGNOSIS — I10 Essential (primary) hypertension: Secondary | ICD-10-CM | POA: Diagnosis not present

## 2021-05-29 DIAGNOSIS — R5081 Fever presenting with conditions classified elsewhere: Secondary | ICD-10-CM | POA: Diagnosis not present

## 2021-05-29 DIAGNOSIS — G909 Disorder of the autonomic nervous system, unspecified: Secondary | ICD-10-CM | POA: Diagnosis not present

## 2021-05-29 DIAGNOSIS — G249 Dystonia, unspecified: Secondary | ICD-10-CM | POA: Diagnosis not present

## 2021-05-29 DIAGNOSIS — D849 Immunodeficiency, unspecified: Secondary | ICD-10-CM | POA: Diagnosis not present

## 2021-05-30 DIAGNOSIS — G0481 Other encephalitis and encephalomyelitis: Secondary | ICD-10-CM | POA: Diagnosis not present

## 2021-05-30 DIAGNOSIS — J9600 Acute respiratory failure, unspecified whether with hypoxia or hypercapnia: Secondary | ICD-10-CM | POA: Diagnosis not present

## 2021-05-30 DIAGNOSIS — D849 Immunodeficiency, unspecified: Secondary | ICD-10-CM | POA: Diagnosis not present

## 2021-05-30 DIAGNOSIS — G909 Disorder of the autonomic nervous system, unspecified: Secondary | ICD-10-CM | POA: Diagnosis not present

## 2021-05-30 DIAGNOSIS — R569 Unspecified convulsions: Secondary | ICD-10-CM | POA: Diagnosis not present

## 2021-05-30 DIAGNOSIS — B965 Pseudomonas (aeruginosa) (mallei) (pseudomallei) as the cause of diseases classified elsewhere: Secondary | ICD-10-CM | POA: Diagnosis not present

## 2021-05-30 DIAGNOSIS — E221 Hyperprolactinemia: Secondary | ICD-10-CM | POA: Diagnosis not present

## 2021-05-30 DIAGNOSIS — G249 Dystonia, unspecified: Secondary | ICD-10-CM | POA: Diagnosis not present

## 2021-05-30 DIAGNOSIS — R5081 Fever presenting with conditions classified elsewhere: Secondary | ICD-10-CM | POA: Diagnosis not present

## 2021-05-30 DIAGNOSIS — I10 Essential (primary) hypertension: Secondary | ICD-10-CM | POA: Diagnosis not present

## 2021-05-30 DIAGNOSIS — R000 Tachycardia, unspecified: Secondary | ICD-10-CM | POA: Diagnosis not present

## 2021-05-30 DIAGNOSIS — J151 Pneumonia due to Pseudomonas: Secondary | ICD-10-CM | POA: Diagnosis not present

## 2021-05-31 DIAGNOSIS — R569 Unspecified convulsions: Secondary | ICD-10-CM | POA: Diagnosis not present

## 2021-05-31 DIAGNOSIS — J151 Pneumonia due to Pseudomonas: Secondary | ICD-10-CM | POA: Diagnosis not present

## 2021-05-31 DIAGNOSIS — Z931 Gastrostomy status: Secondary | ICD-10-CM | POA: Diagnosis not present

## 2021-05-31 DIAGNOSIS — I10 Essential (primary) hypertension: Secondary | ICD-10-CM | POA: Diagnosis not present

## 2021-05-31 DIAGNOSIS — R000 Tachycardia, unspecified: Secondary | ICD-10-CM | POA: Diagnosis not present

## 2021-05-31 DIAGNOSIS — G0481 Other encephalitis and encephalomyelitis: Secondary | ICD-10-CM | POA: Diagnosis not present

## 2021-05-31 DIAGNOSIS — R509 Fever, unspecified: Secondary | ICD-10-CM | POA: Diagnosis not present

## 2021-05-31 DIAGNOSIS — J9600 Acute respiratory failure, unspecified whether with hypoxia or hypercapnia: Secondary | ICD-10-CM | POA: Diagnosis not present

## 2021-05-31 DIAGNOSIS — D849 Immunodeficiency, unspecified: Secondary | ICD-10-CM | POA: Diagnosis not present

## 2021-05-31 DIAGNOSIS — R5081 Fever presenting with conditions classified elsewhere: Secondary | ICD-10-CM | POA: Diagnosis not present

## 2021-05-31 DIAGNOSIS — B965 Pseudomonas (aeruginosa) (mallei) (pseudomallei) as the cause of diseases classified elsewhere: Secondary | ICD-10-CM | POA: Diagnosis not present

## 2021-05-31 DIAGNOSIS — Z930 Tracheostomy status: Secondary | ICD-10-CM | POA: Diagnosis not present

## 2021-05-31 DIAGNOSIS — G249 Dystonia, unspecified: Secondary | ICD-10-CM | POA: Diagnosis not present

## 2021-05-31 DIAGNOSIS — J95851 Ventilator associated pneumonia: Secondary | ICD-10-CM | POA: Diagnosis not present

## 2021-05-31 DIAGNOSIS — E221 Hyperprolactinemia: Secondary | ICD-10-CM | POA: Diagnosis not present

## 2021-05-31 DIAGNOSIS — G909 Disorder of the autonomic nervous system, unspecified: Secondary | ICD-10-CM | POA: Diagnosis not present

## 2021-05-31 DIAGNOSIS — E063 Autoimmune thyroiditis: Secondary | ICD-10-CM | POA: Diagnosis not present

## 2021-06-01 DIAGNOSIS — J9600 Acute respiratory failure, unspecified whether with hypoxia or hypercapnia: Secondary | ICD-10-CM | POA: Diagnosis not present

## 2021-06-01 DIAGNOSIS — J189 Pneumonia, unspecified organism: Secondary | ICD-10-CM | POA: Diagnosis not present

## 2021-06-01 DIAGNOSIS — J95851 Ventilator associated pneumonia: Secondary | ICD-10-CM | POA: Diagnosis not present

## 2021-06-01 DIAGNOSIS — D84821 Immunodeficiency due to drugs: Secondary | ICD-10-CM | POA: Diagnosis not present

## 2021-06-01 DIAGNOSIS — G0481 Other encephalitis and encephalomyelitis: Secondary | ICD-10-CM | POA: Diagnosis not present

## 2021-06-01 DIAGNOSIS — R1319 Other dysphagia: Secondary | ICD-10-CM | POA: Diagnosis not present

## 2021-06-01 DIAGNOSIS — B965 Pseudomonas (aeruginosa) (mallei) (pseudomallei) as the cause of diseases classified elsewhere: Secondary | ICD-10-CM | POA: Diagnosis not present

## 2021-06-01 DIAGNOSIS — Z931 Gastrostomy status: Secondary | ICD-10-CM | POA: Diagnosis not present

## 2021-06-01 DIAGNOSIS — Z930 Tracheostomy status: Secondary | ICD-10-CM | POA: Diagnosis not present

## 2021-06-01 DIAGNOSIS — R509 Fever, unspecified: Secondary | ICD-10-CM | POA: Diagnosis not present

## 2021-06-01 DIAGNOSIS — E039 Hypothyroidism, unspecified: Secondary | ICD-10-CM | POA: Diagnosis not present

## 2021-06-01 DIAGNOSIS — D509 Iron deficiency anemia, unspecified: Secondary | ICD-10-CM | POA: Diagnosis not present

## 2021-06-01 DIAGNOSIS — E063 Autoimmune thyroiditis: Secondary | ICD-10-CM | POA: Diagnosis not present

## 2021-06-02 DIAGNOSIS — J151 Pneumonia due to Pseudomonas: Secondary | ICD-10-CM | POA: Diagnosis not present

## 2021-06-02 DIAGNOSIS — J9600 Acute respiratory failure, unspecified whether with hypoxia or hypercapnia: Secondary | ICD-10-CM | POA: Diagnosis not present

## 2021-06-02 DIAGNOSIS — Z930 Tracheostomy status: Secondary | ICD-10-CM | POA: Diagnosis not present

## 2021-06-02 DIAGNOSIS — Z9911 Dependence on respirator [ventilator] status: Secondary | ICD-10-CM | POA: Diagnosis not present

## 2021-06-02 DIAGNOSIS — G0481 Other encephalitis and encephalomyelitis: Secondary | ICD-10-CM | POA: Diagnosis not present

## 2021-06-02 DIAGNOSIS — D84821 Immunodeficiency due to drugs: Secondary | ICD-10-CM | POA: Diagnosis not present

## 2021-06-02 DIAGNOSIS — E221 Hyperprolactinemia: Secondary | ICD-10-CM | POA: Diagnosis not present

## 2021-06-02 DIAGNOSIS — R000 Tachycardia, unspecified: Secondary | ICD-10-CM | POA: Diagnosis not present

## 2021-06-02 DIAGNOSIS — G9349 Other encephalopathy: Secondary | ICD-10-CM | POA: Diagnosis not present

## 2021-06-02 DIAGNOSIS — J189 Pneumonia, unspecified organism: Secondary | ICD-10-CM | POA: Diagnosis not present

## 2021-06-02 DIAGNOSIS — B965 Pseudomonas (aeruginosa) (mallei) (pseudomallei) as the cause of diseases classified elsewhere: Secondary | ICD-10-CM | POA: Diagnosis not present

## 2021-06-02 DIAGNOSIS — J95851 Ventilator associated pneumonia: Secondary | ICD-10-CM | POA: Diagnosis not present

## 2021-06-02 DIAGNOSIS — R569 Unspecified convulsions: Secondary | ICD-10-CM | POA: Diagnosis not present

## 2021-06-03 DIAGNOSIS — D84821 Immunodeficiency due to drugs: Secondary | ICD-10-CM | POA: Diagnosis not present

## 2021-06-03 DIAGNOSIS — J9600 Acute respiratory failure, unspecified whether with hypoxia or hypercapnia: Secondary | ICD-10-CM | POA: Diagnosis not present

## 2021-06-03 DIAGNOSIS — E221 Hyperprolactinemia: Secondary | ICD-10-CM | POA: Diagnosis not present

## 2021-06-03 DIAGNOSIS — Z930 Tracheostomy status: Secondary | ICD-10-CM | POA: Diagnosis not present

## 2021-06-03 DIAGNOSIS — G0481 Other encephalitis and encephalomyelitis: Secondary | ICD-10-CM | POA: Diagnosis not present

## 2021-06-03 DIAGNOSIS — R569 Unspecified convulsions: Secondary | ICD-10-CM | POA: Diagnosis not present

## 2021-06-03 DIAGNOSIS — B965 Pseudomonas (aeruginosa) (mallei) (pseudomallei) as the cause of diseases classified elsewhere: Secondary | ICD-10-CM | POA: Diagnosis not present

## 2021-06-03 DIAGNOSIS — G9349 Other encephalopathy: Secondary | ICD-10-CM | POA: Diagnosis not present

## 2021-06-03 DIAGNOSIS — J151 Pneumonia due to Pseudomonas: Secondary | ICD-10-CM | POA: Diagnosis not present

## 2021-06-03 DIAGNOSIS — Z9911 Dependence on respirator [ventilator] status: Secondary | ICD-10-CM | POA: Diagnosis not present

## 2021-06-03 DIAGNOSIS — J95851 Ventilator associated pneumonia: Secondary | ICD-10-CM | POA: Diagnosis not present

## 2021-06-03 DIAGNOSIS — J189 Pneumonia, unspecified organism: Secondary | ICD-10-CM | POA: Diagnosis not present

## 2021-06-04 DIAGNOSIS — B965 Pseudomonas (aeruginosa) (mallei) (pseudomallei) as the cause of diseases classified elsewhere: Secondary | ICD-10-CM | POA: Diagnosis not present

## 2021-06-04 DIAGNOSIS — D84821 Immunodeficiency due to drugs: Secondary | ICD-10-CM | POA: Diagnosis not present

## 2021-06-04 DIAGNOSIS — E221 Hyperprolactinemia: Secondary | ICD-10-CM | POA: Diagnosis not present

## 2021-06-04 DIAGNOSIS — Z9911 Dependence on respirator [ventilator] status: Secondary | ICD-10-CM | POA: Diagnosis not present

## 2021-06-04 DIAGNOSIS — G9349 Other encephalopathy: Secondary | ICD-10-CM | POA: Diagnosis not present

## 2021-06-04 DIAGNOSIS — R509 Fever, unspecified: Secondary | ICD-10-CM | POA: Diagnosis not present

## 2021-06-04 DIAGNOSIS — G0481 Other encephalitis and encephalomyelitis: Secondary | ICD-10-CM | POA: Diagnosis not present

## 2021-06-04 DIAGNOSIS — J9600 Acute respiratory failure, unspecified whether with hypoxia or hypercapnia: Secondary | ICD-10-CM | POA: Diagnosis not present

## 2021-06-04 DIAGNOSIS — J95851 Ventilator associated pneumonia: Secondary | ICD-10-CM | POA: Diagnosis not present

## 2021-06-04 DIAGNOSIS — Z930 Tracheostomy status: Secondary | ICD-10-CM | POA: Diagnosis not present

## 2021-06-04 DIAGNOSIS — J151 Pneumonia due to Pseudomonas: Secondary | ICD-10-CM | POA: Diagnosis not present

## 2021-06-04 DIAGNOSIS — I10 Essential (primary) hypertension: Secondary | ICD-10-CM | POA: Diagnosis not present

## 2021-06-05 DIAGNOSIS — J151 Pneumonia due to Pseudomonas: Secondary | ICD-10-CM | POA: Diagnosis not present

## 2021-06-05 DIAGNOSIS — Z9911 Dependence on respirator [ventilator] status: Secondary | ICD-10-CM | POA: Diagnosis not present

## 2021-06-05 DIAGNOSIS — G0481 Other encephalitis and encephalomyelitis: Secondary | ICD-10-CM | POA: Diagnosis not present

## 2021-06-05 DIAGNOSIS — G9349 Other encephalopathy: Secondary | ICD-10-CM | POA: Diagnosis not present

## 2021-06-05 DIAGNOSIS — I10 Essential (primary) hypertension: Secondary | ICD-10-CM | POA: Diagnosis not present

## 2021-06-05 DIAGNOSIS — D84821 Immunodeficiency due to drugs: Secondary | ICD-10-CM | POA: Diagnosis not present

## 2021-06-05 DIAGNOSIS — J95851 Ventilator associated pneumonia: Secondary | ICD-10-CM | POA: Diagnosis not present

## 2021-06-05 DIAGNOSIS — B965 Pseudomonas (aeruginosa) (mallei) (pseudomallei) as the cause of diseases classified elsewhere: Secondary | ICD-10-CM | POA: Diagnosis not present

## 2021-06-05 DIAGNOSIS — E221 Hyperprolactinemia: Secondary | ICD-10-CM | POA: Diagnosis not present

## 2021-06-05 DIAGNOSIS — J9600 Acute respiratory failure, unspecified whether with hypoxia or hypercapnia: Secondary | ICD-10-CM | POA: Diagnosis not present

## 2021-06-05 DIAGNOSIS — Z930 Tracheostomy status: Secondary | ICD-10-CM | POA: Diagnosis not present

## 2021-06-05 DIAGNOSIS — R509 Fever, unspecified: Secondary | ICD-10-CM | POA: Diagnosis not present

## 2021-06-06 DIAGNOSIS — I10 Essential (primary) hypertension: Secondary | ICD-10-CM | POA: Diagnosis not present

## 2021-06-06 DIAGNOSIS — D84821 Immunodeficiency due to drugs: Secondary | ICD-10-CM | POA: Diagnosis not present

## 2021-06-06 DIAGNOSIS — R509 Fever, unspecified: Secondary | ICD-10-CM | POA: Diagnosis not present

## 2021-06-06 DIAGNOSIS — G9349 Other encephalopathy: Secondary | ICD-10-CM | POA: Diagnosis not present

## 2021-06-06 DIAGNOSIS — E221 Hyperprolactinemia: Secondary | ICD-10-CM | POA: Diagnosis not present

## 2021-06-06 DIAGNOSIS — Z9911 Dependence on respirator [ventilator] status: Secondary | ICD-10-CM | POA: Diagnosis not present

## 2021-06-06 DIAGNOSIS — J9600 Acute respiratory failure, unspecified whether with hypoxia or hypercapnia: Secondary | ICD-10-CM | POA: Diagnosis not present

## 2021-06-06 DIAGNOSIS — B965 Pseudomonas (aeruginosa) (mallei) (pseudomallei) as the cause of diseases classified elsewhere: Secondary | ICD-10-CM | POA: Diagnosis not present

## 2021-06-06 DIAGNOSIS — Z930 Tracheostomy status: Secondary | ICD-10-CM | POA: Diagnosis not present

## 2021-06-06 DIAGNOSIS — J95851 Ventilator associated pneumonia: Secondary | ICD-10-CM | POA: Diagnosis not present

## 2021-06-06 DIAGNOSIS — J151 Pneumonia due to Pseudomonas: Secondary | ICD-10-CM | POA: Diagnosis not present

## 2021-06-06 DIAGNOSIS — G0481 Other encephalitis and encephalomyelitis: Secondary | ICD-10-CM | POA: Diagnosis not present

## 2021-06-07 DIAGNOSIS — D849 Immunodeficiency, unspecified: Secondary | ICD-10-CM | POA: Diagnosis not present

## 2021-06-07 DIAGNOSIS — Y95 Nosocomial condition: Secondary | ICD-10-CM | POA: Diagnosis not present

## 2021-06-07 DIAGNOSIS — B9620 Unspecified Escherichia coli [E. coli] as the cause of diseases classified elsewhere: Secondary | ICD-10-CM | POA: Diagnosis not present

## 2021-06-07 DIAGNOSIS — R569 Unspecified convulsions: Secondary | ICD-10-CM | POA: Diagnosis not present

## 2021-06-07 DIAGNOSIS — J95851 Ventilator associated pneumonia: Secondary | ICD-10-CM | POA: Diagnosis not present

## 2021-06-07 DIAGNOSIS — R509 Fever, unspecified: Secondary | ICD-10-CM | POA: Diagnosis not present

## 2021-06-07 DIAGNOSIS — J151 Pneumonia due to Pseudomonas: Secondary | ICD-10-CM | POA: Diagnosis not present

## 2021-06-07 DIAGNOSIS — R000 Tachycardia, unspecified: Secondary | ICD-10-CM | POA: Diagnosis not present

## 2021-06-07 DIAGNOSIS — G0481 Other encephalitis and encephalomyelitis: Secondary | ICD-10-CM | POA: Diagnosis not present

## 2021-06-07 DIAGNOSIS — Z930 Tracheostomy status: Secondary | ICD-10-CM | POA: Diagnosis not present

## 2021-06-07 DIAGNOSIS — G909 Disorder of the autonomic nervous system, unspecified: Secondary | ICD-10-CM | POA: Diagnosis not present

## 2021-06-07 DIAGNOSIS — E221 Hyperprolactinemia: Secondary | ICD-10-CM | POA: Diagnosis not present

## 2021-06-08 DIAGNOSIS — R1319 Other dysphagia: Secondary | ICD-10-CM | POA: Diagnosis not present

## 2021-06-08 DIAGNOSIS — J9600 Acute respiratory failure, unspecified whether with hypoxia or hypercapnia: Secondary | ICD-10-CM | POA: Diagnosis not present

## 2021-06-08 DIAGNOSIS — I10 Essential (primary) hypertension: Secondary | ICD-10-CM | POA: Diagnosis not present

## 2021-06-08 DIAGNOSIS — Z930 Tracheostomy status: Secondary | ICD-10-CM | POA: Diagnosis not present

## 2021-06-08 DIAGNOSIS — J151 Pneumonia due to Pseudomonas: Secondary | ICD-10-CM | POA: Diagnosis not present

## 2021-06-08 DIAGNOSIS — E221 Hyperprolactinemia: Secondary | ICD-10-CM | POA: Diagnosis not present

## 2021-06-08 DIAGNOSIS — G0481 Other encephalitis and encephalomyelitis: Secondary | ICD-10-CM | POA: Diagnosis not present

## 2021-06-08 DIAGNOSIS — G9389 Other specified disorders of brain: Secondary | ICD-10-CM | POA: Diagnosis not present

## 2021-06-08 DIAGNOSIS — Z9221 Personal history of antineoplastic chemotherapy: Secondary | ICD-10-CM | POA: Diagnosis not present

## 2021-06-08 DIAGNOSIS — R259 Unspecified abnormal involuntary movements: Secondary | ICD-10-CM | POA: Diagnosis not present

## 2021-06-08 DIAGNOSIS — J95851 Ventilator associated pneumonia: Secondary | ICD-10-CM | POA: Diagnosis not present

## 2021-06-08 DIAGNOSIS — D8489 Other immunodeficiencies: Secondary | ICD-10-CM | POA: Diagnosis not present

## 2021-06-09 DIAGNOSIS — D8489 Other immunodeficiencies: Secondary | ICD-10-CM | POA: Diagnosis not present

## 2021-06-09 DIAGNOSIS — J95851 Ventilator associated pneumonia: Secondary | ICD-10-CM | POA: Diagnosis not present

## 2021-06-09 DIAGNOSIS — J9600 Acute respiratory failure, unspecified whether with hypoxia or hypercapnia: Secondary | ICD-10-CM | POA: Diagnosis not present

## 2021-06-09 DIAGNOSIS — E221 Hyperprolactinemia: Secondary | ICD-10-CM | POA: Diagnosis not present

## 2021-06-09 DIAGNOSIS — G0481 Other encephalitis and encephalomyelitis: Secondary | ICD-10-CM | POA: Diagnosis not present

## 2021-06-09 DIAGNOSIS — Z930 Tracheostomy status: Secondary | ICD-10-CM | POA: Diagnosis not present

## 2021-06-09 DIAGNOSIS — R259 Unspecified abnormal involuntary movements: Secondary | ICD-10-CM | POA: Diagnosis not present

## 2021-06-09 DIAGNOSIS — Z9221 Personal history of antineoplastic chemotherapy: Secondary | ICD-10-CM | POA: Diagnosis not present

## 2021-06-09 DIAGNOSIS — I10 Essential (primary) hypertension: Secondary | ICD-10-CM | POA: Diagnosis not present

## 2021-06-09 DIAGNOSIS — R1319 Other dysphagia: Secondary | ICD-10-CM | POA: Diagnosis not present

## 2021-06-09 DIAGNOSIS — G9389 Other specified disorders of brain: Secondary | ICD-10-CM | POA: Diagnosis not present

## 2021-06-09 DIAGNOSIS — J151 Pneumonia due to Pseudomonas: Secondary | ICD-10-CM | POA: Diagnosis not present

## 2021-06-10 DIAGNOSIS — G0481 Other encephalitis and encephalomyelitis: Secondary | ICD-10-CM | POA: Diagnosis not present

## 2021-06-10 DIAGNOSIS — R569 Unspecified convulsions: Secondary | ICD-10-CM | POA: Diagnosis not present

## 2021-06-10 DIAGNOSIS — Z9221 Personal history of antineoplastic chemotherapy: Secondary | ICD-10-CM | POA: Diagnosis not present

## 2021-06-10 DIAGNOSIS — R1319 Other dysphagia: Secondary | ICD-10-CM | POA: Diagnosis not present

## 2021-06-10 DIAGNOSIS — D84821 Immunodeficiency due to drugs: Secondary | ICD-10-CM | POA: Diagnosis not present

## 2021-06-10 DIAGNOSIS — G0490 Encephalitis and encephalomyelitis, unspecified: Secondary | ICD-10-CM | POA: Diagnosis not present

## 2021-06-10 DIAGNOSIS — J95851 Ventilator associated pneumonia: Secondary | ICD-10-CM | POA: Diagnosis not present

## 2021-06-10 DIAGNOSIS — Z930 Tracheostomy status: Secondary | ICD-10-CM | POA: Diagnosis not present

## 2021-06-10 DIAGNOSIS — E221 Hyperprolactinemia: Secondary | ICD-10-CM | POA: Diagnosis not present

## 2021-06-10 DIAGNOSIS — J9600 Acute respiratory failure, unspecified whether with hypoxia or hypercapnia: Secondary | ICD-10-CM | POA: Diagnosis not present

## 2021-06-10 DIAGNOSIS — R5081 Fever presenting with conditions classified elsewhere: Secondary | ICD-10-CM | POA: Diagnosis not present

## 2021-06-10 DIAGNOSIS — R259 Unspecified abnormal involuntary movements: Secondary | ICD-10-CM | POA: Diagnosis not present

## 2021-06-11 DIAGNOSIS — Z930 Tracheostomy status: Secondary | ICD-10-CM | POA: Diagnosis not present

## 2021-06-11 DIAGNOSIS — R1319 Other dysphagia: Secondary | ICD-10-CM | POA: Diagnosis not present

## 2021-06-11 DIAGNOSIS — E221 Hyperprolactinemia: Secondary | ICD-10-CM | POA: Diagnosis not present

## 2021-06-11 DIAGNOSIS — D84821 Immunodeficiency due to drugs: Secondary | ICD-10-CM | POA: Diagnosis not present

## 2021-06-11 DIAGNOSIS — G0490 Encephalitis and encephalomyelitis, unspecified: Secondary | ICD-10-CM | POA: Diagnosis not present

## 2021-06-11 DIAGNOSIS — R259 Unspecified abnormal involuntary movements: Secondary | ICD-10-CM | POA: Diagnosis not present

## 2021-06-11 DIAGNOSIS — R5081 Fever presenting with conditions classified elsewhere: Secondary | ICD-10-CM | POA: Diagnosis not present

## 2021-06-11 DIAGNOSIS — J9600 Acute respiratory failure, unspecified whether with hypoxia or hypercapnia: Secondary | ICD-10-CM | POA: Diagnosis not present

## 2021-06-11 DIAGNOSIS — G0481 Other encephalitis and encephalomyelitis: Secondary | ICD-10-CM | POA: Diagnosis not present

## 2021-06-11 DIAGNOSIS — Z9221 Personal history of antineoplastic chemotherapy: Secondary | ICD-10-CM | POA: Diagnosis not present

## 2021-06-11 DIAGNOSIS — J95851 Ventilator associated pneumonia: Secondary | ICD-10-CM | POA: Diagnosis not present

## 2021-06-11 DIAGNOSIS — R569 Unspecified convulsions: Secondary | ICD-10-CM | POA: Diagnosis not present

## 2021-06-12 DIAGNOSIS — D84821 Immunodeficiency due to drugs: Secondary | ICD-10-CM | POA: Diagnosis not present

## 2021-06-12 DIAGNOSIS — Z9221 Personal history of antineoplastic chemotherapy: Secondary | ICD-10-CM | POA: Diagnosis not present

## 2021-06-12 DIAGNOSIS — R1319 Other dysphagia: Secondary | ICD-10-CM | POA: Diagnosis not present

## 2021-06-12 DIAGNOSIS — Z930 Tracheostomy status: Secondary | ICD-10-CM | POA: Diagnosis not present

## 2021-06-12 DIAGNOSIS — R259 Unspecified abnormal involuntary movements: Secondary | ICD-10-CM | POA: Diagnosis not present

## 2021-06-12 DIAGNOSIS — G0481 Other encephalitis and encephalomyelitis: Secondary | ICD-10-CM | POA: Diagnosis not present

## 2021-06-12 DIAGNOSIS — J95851 Ventilator associated pneumonia: Secondary | ICD-10-CM | POA: Diagnosis not present

## 2021-06-12 DIAGNOSIS — J9600 Acute respiratory failure, unspecified whether with hypoxia or hypercapnia: Secondary | ICD-10-CM | POA: Diagnosis not present

## 2021-06-12 DIAGNOSIS — R569 Unspecified convulsions: Secondary | ICD-10-CM | POA: Diagnosis not present

## 2021-06-12 DIAGNOSIS — E221 Hyperprolactinemia: Secondary | ICD-10-CM | POA: Diagnosis not present

## 2021-06-12 DIAGNOSIS — G0490 Encephalitis and encephalomyelitis, unspecified: Secondary | ICD-10-CM | POA: Diagnosis not present

## 2021-06-13 DIAGNOSIS — Z9221 Personal history of antineoplastic chemotherapy: Secondary | ICD-10-CM | POA: Diagnosis not present

## 2021-06-13 DIAGNOSIS — R569 Unspecified convulsions: Secondary | ICD-10-CM | POA: Diagnosis not present

## 2021-06-13 DIAGNOSIS — D84821 Immunodeficiency due to drugs: Secondary | ICD-10-CM | POA: Diagnosis not present

## 2021-06-13 DIAGNOSIS — G0490 Encephalitis and encephalomyelitis, unspecified: Secondary | ICD-10-CM | POA: Diagnosis not present

## 2021-06-13 DIAGNOSIS — E221 Hyperprolactinemia: Secondary | ICD-10-CM | POA: Diagnosis not present

## 2021-06-13 DIAGNOSIS — J95851 Ventilator associated pneumonia: Secondary | ICD-10-CM | POA: Diagnosis not present

## 2021-06-13 DIAGNOSIS — G0481 Other encephalitis and encephalomyelitis: Secondary | ICD-10-CM | POA: Diagnosis not present

## 2021-06-13 DIAGNOSIS — R259 Unspecified abnormal involuntary movements: Secondary | ICD-10-CM | POA: Diagnosis not present

## 2021-06-13 DIAGNOSIS — R1319 Other dysphagia: Secondary | ICD-10-CM | POA: Diagnosis not present

## 2021-06-13 DIAGNOSIS — J9600 Acute respiratory failure, unspecified whether with hypoxia or hypercapnia: Secondary | ICD-10-CM | POA: Diagnosis not present

## 2021-06-13 DIAGNOSIS — Z930 Tracheostomy status: Secondary | ICD-10-CM | POA: Diagnosis not present

## 2021-06-14 DIAGNOSIS — G0481 Other encephalitis and encephalomyelitis: Secondary | ICD-10-CM | POA: Diagnosis not present

## 2021-06-14 DIAGNOSIS — G0490 Encephalitis and encephalomyelitis, unspecified: Secondary | ICD-10-CM | POA: Diagnosis not present

## 2021-06-14 DIAGNOSIS — Z9911 Dependence on respirator [ventilator] status: Secondary | ICD-10-CM | POA: Diagnosis not present

## 2021-06-14 DIAGNOSIS — M359 Systemic involvement of connective tissue, unspecified: Secondary | ICD-10-CM | POA: Diagnosis not present

## 2021-06-14 DIAGNOSIS — R451 Restlessness and agitation: Secondary | ICD-10-CM | POA: Diagnosis not present

## 2021-06-14 DIAGNOSIS — B965 Pseudomonas (aeruginosa) (mallei) (pseudomallei) as the cause of diseases classified elsewhere: Secondary | ICD-10-CM | POA: Diagnosis not present

## 2021-06-14 DIAGNOSIS — I10 Essential (primary) hypertension: Secondary | ICD-10-CM | POA: Diagnosis not present

## 2021-06-14 DIAGNOSIS — J9600 Acute respiratory failure, unspecified whether with hypoxia or hypercapnia: Secondary | ICD-10-CM | POA: Diagnosis not present

## 2021-06-14 DIAGNOSIS — R000 Tachycardia, unspecified: Secondary | ICD-10-CM | POA: Diagnosis not present

## 2021-06-14 DIAGNOSIS — G901 Familial dysautonomia [Riley-Day]: Secondary | ICD-10-CM | POA: Diagnosis not present

## 2021-06-14 DIAGNOSIS — R569 Unspecified convulsions: Secondary | ICD-10-CM | POA: Diagnosis not present

## 2021-06-14 DIAGNOSIS — J95851 Ventilator associated pneumonia: Secondary | ICD-10-CM | POA: Diagnosis not present

## 2021-06-15 DIAGNOSIS — D509 Iron deficiency anemia, unspecified: Secondary | ICD-10-CM | POA: Diagnosis not present

## 2021-06-15 DIAGNOSIS — J9600 Acute respiratory failure, unspecified whether with hypoxia or hypercapnia: Secondary | ICD-10-CM | POA: Diagnosis not present

## 2021-06-15 DIAGNOSIS — G0490 Encephalitis and encephalomyelitis, unspecified: Secondary | ICD-10-CM | POA: Diagnosis not present

## 2021-06-15 DIAGNOSIS — E063 Autoimmune thyroiditis: Secondary | ICD-10-CM | POA: Diagnosis not present

## 2021-06-15 DIAGNOSIS — G901 Familial dysautonomia [Riley-Day]: Secondary | ICD-10-CM | POA: Diagnosis not present

## 2021-06-15 DIAGNOSIS — G0481 Other encephalitis and encephalomyelitis: Secondary | ICD-10-CM | POA: Diagnosis not present

## 2021-06-15 DIAGNOSIS — Z930 Tracheostomy status: Secondary | ICD-10-CM | POA: Diagnosis not present

## 2021-06-15 DIAGNOSIS — R569 Unspecified convulsions: Secondary | ICD-10-CM | POA: Diagnosis not present

## 2021-06-15 DIAGNOSIS — R451 Restlessness and agitation: Secondary | ICD-10-CM | POA: Diagnosis not present

## 2021-06-15 DIAGNOSIS — R000 Tachycardia, unspecified: Secondary | ICD-10-CM | POA: Diagnosis not present

## 2021-06-15 DIAGNOSIS — I10 Essential (primary) hypertension: Secondary | ICD-10-CM | POA: Diagnosis not present

## 2021-06-15 DIAGNOSIS — Z9911 Dependence on respirator [ventilator] status: Secondary | ICD-10-CM | POA: Diagnosis not present

## 2021-06-16 DIAGNOSIS — I10 Essential (primary) hypertension: Secondary | ICD-10-CM | POA: Diagnosis not present

## 2021-06-16 DIAGNOSIS — Z930 Tracheostomy status: Secondary | ICD-10-CM | POA: Diagnosis not present

## 2021-06-16 DIAGNOSIS — R000 Tachycardia, unspecified: Secondary | ICD-10-CM | POA: Diagnosis not present

## 2021-06-16 DIAGNOSIS — Z9911 Dependence on respirator [ventilator] status: Secondary | ICD-10-CM | POA: Diagnosis not present

## 2021-06-16 DIAGNOSIS — G0481 Other encephalitis and encephalomyelitis: Secondary | ICD-10-CM | POA: Diagnosis not present

## 2021-06-16 DIAGNOSIS — E063 Autoimmune thyroiditis: Secondary | ICD-10-CM | POA: Diagnosis not present

## 2021-06-16 DIAGNOSIS — G0490 Encephalitis and encephalomyelitis, unspecified: Secondary | ICD-10-CM | POA: Diagnosis not present

## 2021-06-16 DIAGNOSIS — G901 Familial dysautonomia [Riley-Day]: Secondary | ICD-10-CM | POA: Diagnosis not present

## 2021-06-16 DIAGNOSIS — J9600 Acute respiratory failure, unspecified whether with hypoxia or hypercapnia: Secondary | ICD-10-CM | POA: Diagnosis not present

## 2021-06-16 DIAGNOSIS — R569 Unspecified convulsions: Secondary | ICD-10-CM | POA: Diagnosis not present

## 2021-06-16 DIAGNOSIS — R339 Retention of urine, unspecified: Secondary | ICD-10-CM | POA: Diagnosis not present

## 2021-06-16 DIAGNOSIS — D509 Iron deficiency anemia, unspecified: Secondary | ICD-10-CM | POA: Diagnosis not present

## 2021-06-17 DIAGNOSIS — E063 Autoimmune thyroiditis: Secondary | ICD-10-CM | POA: Diagnosis not present

## 2021-06-17 DIAGNOSIS — G0481 Other encephalitis and encephalomyelitis: Secondary | ICD-10-CM | POA: Diagnosis not present

## 2021-06-17 DIAGNOSIS — Z9911 Dependence on respirator [ventilator] status: Secondary | ICD-10-CM | POA: Diagnosis not present

## 2021-06-17 DIAGNOSIS — Z930 Tracheostomy status: Secondary | ICD-10-CM | POA: Diagnosis not present

## 2021-06-17 DIAGNOSIS — E039 Hypothyroidism, unspecified: Secondary | ICD-10-CM | POA: Diagnosis not present

## 2021-06-17 DIAGNOSIS — G0490 Encephalitis and encephalomyelitis, unspecified: Secondary | ICD-10-CM | POA: Diagnosis not present

## 2021-06-17 DIAGNOSIS — R569 Unspecified convulsions: Secondary | ICD-10-CM | POA: Diagnosis not present

## 2021-06-17 DIAGNOSIS — J9600 Acute respiratory failure, unspecified whether with hypoxia or hypercapnia: Secondary | ICD-10-CM | POA: Diagnosis not present

## 2021-06-17 DIAGNOSIS — G901 Familial dysautonomia [Riley-Day]: Secondary | ICD-10-CM | POA: Diagnosis not present

## 2021-06-17 DIAGNOSIS — D509 Iron deficiency anemia, unspecified: Secondary | ICD-10-CM | POA: Diagnosis not present

## 2021-06-17 DIAGNOSIS — R000 Tachycardia, unspecified: Secondary | ICD-10-CM | POA: Diagnosis not present

## 2021-06-17 DIAGNOSIS — R5081 Fever presenting with conditions classified elsewhere: Secondary | ICD-10-CM | POA: Diagnosis not present

## 2021-06-18 DIAGNOSIS — R569 Unspecified convulsions: Secondary | ICD-10-CM | POA: Diagnosis not present

## 2021-06-18 DIAGNOSIS — R1319 Other dysphagia: Secondary | ICD-10-CM | POA: Diagnosis not present

## 2021-06-18 DIAGNOSIS — R509 Fever, unspecified: Secondary | ICD-10-CM | POA: Diagnosis not present

## 2021-06-18 DIAGNOSIS — I10 Essential (primary) hypertension: Secondary | ICD-10-CM | POA: Diagnosis not present

## 2021-06-18 DIAGNOSIS — Z930 Tracheostomy status: Secondary | ICD-10-CM | POA: Diagnosis not present

## 2021-06-18 DIAGNOSIS — E063 Autoimmune thyroiditis: Secondary | ICD-10-CM | POA: Diagnosis not present

## 2021-06-18 DIAGNOSIS — R000 Tachycardia, unspecified: Secondary | ICD-10-CM | POA: Diagnosis not present

## 2021-06-18 DIAGNOSIS — G909 Disorder of the autonomic nervous system, unspecified: Secondary | ICD-10-CM | POA: Diagnosis not present

## 2021-06-18 DIAGNOSIS — J9601 Acute respiratory failure with hypoxia: Secondary | ICD-10-CM | POA: Diagnosis not present

## 2021-06-18 DIAGNOSIS — E039 Hypothyroidism, unspecified: Secondary | ICD-10-CM | POA: Diagnosis not present

## 2021-06-18 DIAGNOSIS — Z9981 Dependence on supplemental oxygen: Secondary | ICD-10-CM | POA: Diagnosis not present

## 2021-06-18 DIAGNOSIS — G0481 Other encephalitis and encephalomyelitis: Secondary | ICD-10-CM | POA: Diagnosis not present

## 2021-06-19 DIAGNOSIS — E063 Autoimmune thyroiditis: Secondary | ICD-10-CM | POA: Diagnosis not present

## 2021-06-19 DIAGNOSIS — R000 Tachycardia, unspecified: Secondary | ICD-10-CM | POA: Diagnosis not present

## 2021-06-19 DIAGNOSIS — G253 Myoclonus: Secondary | ICD-10-CM | POA: Diagnosis not present

## 2021-06-19 DIAGNOSIS — G0481 Other encephalitis and encephalomyelitis: Secondary | ICD-10-CM | POA: Diagnosis not present

## 2021-06-19 DIAGNOSIS — I10 Essential (primary) hypertension: Secondary | ICD-10-CM | POA: Diagnosis not present

## 2021-06-19 DIAGNOSIS — J9601 Acute respiratory failure with hypoxia: Secondary | ICD-10-CM | POA: Diagnosis not present

## 2021-06-19 DIAGNOSIS — D509 Iron deficiency anemia, unspecified: Secondary | ICD-10-CM | POA: Diagnosis not present

## 2021-06-19 DIAGNOSIS — E039 Hypothyroidism, unspecified: Secondary | ICD-10-CM | POA: Diagnosis not present

## 2021-06-19 DIAGNOSIS — R1319 Other dysphagia: Secondary | ICD-10-CM | POA: Diagnosis not present

## 2021-06-19 DIAGNOSIS — R569 Unspecified convulsions: Secondary | ICD-10-CM | POA: Diagnosis not present

## 2021-06-19 DIAGNOSIS — I959 Hypotension, unspecified: Secondary | ICD-10-CM | POA: Diagnosis not present

## 2021-06-19 DIAGNOSIS — G909 Disorder of the autonomic nervous system, unspecified: Secondary | ICD-10-CM | POA: Diagnosis not present

## 2021-06-20 DIAGNOSIS — R509 Fever, unspecified: Secondary | ICD-10-CM | POA: Diagnosis not present

## 2021-06-20 DIAGNOSIS — E063 Autoimmune thyroiditis: Secondary | ICD-10-CM | POA: Diagnosis not present

## 2021-06-20 DIAGNOSIS — G0481 Other encephalitis and encephalomyelitis: Secondary | ICD-10-CM | POA: Diagnosis not present

## 2021-06-20 DIAGNOSIS — Z930 Tracheostomy status: Secondary | ICD-10-CM | POA: Diagnosis not present

## 2021-06-20 DIAGNOSIS — R569 Unspecified convulsions: Secondary | ICD-10-CM | POA: Diagnosis not present

## 2021-06-20 DIAGNOSIS — I10 Essential (primary) hypertension: Secondary | ICD-10-CM | POA: Diagnosis not present

## 2021-06-20 DIAGNOSIS — E039 Hypothyroidism, unspecified: Secondary | ICD-10-CM | POA: Diagnosis not present

## 2021-06-20 DIAGNOSIS — J9601 Acute respiratory failure with hypoxia: Secondary | ICD-10-CM | POA: Diagnosis not present

## 2021-06-20 DIAGNOSIS — R000 Tachycardia, unspecified: Secondary | ICD-10-CM | POA: Diagnosis not present

## 2021-06-20 DIAGNOSIS — G909 Disorder of the autonomic nervous system, unspecified: Secondary | ICD-10-CM | POA: Diagnosis not present

## 2021-06-20 DIAGNOSIS — Z9981 Dependence on supplemental oxygen: Secondary | ICD-10-CM | POA: Diagnosis not present

## 2021-06-20 DIAGNOSIS — R1319 Other dysphagia: Secondary | ICD-10-CM | POA: Diagnosis not present

## 2021-06-21 DIAGNOSIS — R000 Tachycardia, unspecified: Secondary | ICD-10-CM | POA: Diagnosis not present

## 2021-06-21 DIAGNOSIS — R569 Unspecified convulsions: Secondary | ICD-10-CM | POA: Diagnosis not present

## 2021-06-21 DIAGNOSIS — R1310 Dysphagia, unspecified: Secondary | ICD-10-CM | POA: Diagnosis not present

## 2021-06-21 DIAGNOSIS — R5081 Fever presenting with conditions classified elsewhere: Secondary | ICD-10-CM | POA: Diagnosis not present

## 2021-06-21 DIAGNOSIS — G253 Myoclonus: Secondary | ICD-10-CM | POA: Diagnosis not present

## 2021-06-21 DIAGNOSIS — G0481 Other encephalitis and encephalomyelitis: Secondary | ICD-10-CM | POA: Diagnosis not present

## 2021-06-21 DIAGNOSIS — Z930 Tracheostomy status: Secondary | ICD-10-CM | POA: Diagnosis not present

## 2021-06-21 DIAGNOSIS — R339 Retention of urine, unspecified: Secondary | ICD-10-CM | POA: Diagnosis not present

## 2021-06-21 DIAGNOSIS — Z9981 Dependence on supplemental oxygen: Secondary | ICD-10-CM | POA: Diagnosis not present

## 2021-06-21 DIAGNOSIS — G909 Disorder of the autonomic nervous system, unspecified: Secondary | ICD-10-CM | POA: Diagnosis not present

## 2021-06-21 DIAGNOSIS — J9600 Acute respiratory failure, unspecified whether with hypoxia or hypercapnia: Secondary | ICD-10-CM | POA: Diagnosis not present

## 2021-06-21 DIAGNOSIS — D509 Iron deficiency anemia, unspecified: Secondary | ICD-10-CM | POA: Diagnosis not present

## 2021-06-22 DIAGNOSIS — E039 Hypothyroidism, unspecified: Secondary | ICD-10-CM | POA: Diagnosis not present

## 2021-06-22 DIAGNOSIS — G909 Disorder of the autonomic nervous system, unspecified: Secondary | ICD-10-CM | POA: Diagnosis not present

## 2021-06-22 DIAGNOSIS — R569 Unspecified convulsions: Secondary | ICD-10-CM | POA: Diagnosis not present

## 2021-06-22 DIAGNOSIS — B181 Chronic viral hepatitis B without delta-agent: Secondary | ICD-10-CM | POA: Diagnosis not present

## 2021-06-22 DIAGNOSIS — D509 Iron deficiency anemia, unspecified: Secondary | ICD-10-CM | POA: Diagnosis not present

## 2021-06-22 DIAGNOSIS — K219 Gastro-esophageal reflux disease without esophagitis: Secondary | ICD-10-CM | POA: Diagnosis not present

## 2021-06-22 DIAGNOSIS — R1310 Dysphagia, unspecified: Secondary | ICD-10-CM | POA: Diagnosis not present

## 2021-06-22 DIAGNOSIS — E063 Autoimmune thyroiditis: Secondary | ICD-10-CM | POA: Diagnosis not present

## 2021-06-22 DIAGNOSIS — G0481 Other encephalitis and encephalomyelitis: Secondary | ICD-10-CM | POA: Diagnosis not present

## 2021-06-22 DIAGNOSIS — Z930 Tracheostomy status: Secondary | ICD-10-CM | POA: Diagnosis not present

## 2021-06-22 DIAGNOSIS — Z931 Gastrostomy status: Secondary | ICD-10-CM | POA: Diagnosis not present

## 2021-06-22 DIAGNOSIS — J9600 Acute respiratory failure, unspecified whether with hypoxia or hypercapnia: Secondary | ICD-10-CM | POA: Diagnosis not present

## 2021-06-23 DIAGNOSIS — B181 Chronic viral hepatitis B without delta-agent: Secondary | ICD-10-CM | POA: Diagnosis not present

## 2021-06-23 DIAGNOSIS — D509 Iron deficiency anemia, unspecified: Secondary | ICD-10-CM | POA: Diagnosis not present

## 2021-06-23 DIAGNOSIS — R569 Unspecified convulsions: Secondary | ICD-10-CM | POA: Diagnosis not present

## 2021-06-23 DIAGNOSIS — E038 Other specified hypothyroidism: Secondary | ICD-10-CM | POA: Diagnosis not present

## 2021-06-23 DIAGNOSIS — G909 Disorder of the autonomic nervous system, unspecified: Secondary | ICD-10-CM | POA: Diagnosis not present

## 2021-06-23 DIAGNOSIS — R1319 Other dysphagia: Secondary | ICD-10-CM | POA: Diagnosis not present

## 2021-06-23 DIAGNOSIS — Z930 Tracheostomy status: Secondary | ICD-10-CM | POA: Diagnosis not present

## 2021-06-23 DIAGNOSIS — E221 Hyperprolactinemia: Secondary | ICD-10-CM | POA: Diagnosis not present

## 2021-06-23 DIAGNOSIS — E063 Autoimmune thyroiditis: Secondary | ICD-10-CM | POA: Diagnosis not present

## 2021-06-23 DIAGNOSIS — G8250 Quadriplegia, unspecified: Secondary | ICD-10-CM | POA: Diagnosis not present

## 2021-06-23 DIAGNOSIS — G0481 Other encephalitis and encephalomyelitis: Secondary | ICD-10-CM | POA: Diagnosis not present

## 2021-06-23 DIAGNOSIS — K5900 Constipation, unspecified: Secondary | ICD-10-CM | POA: Diagnosis not present

## 2021-06-23 DIAGNOSIS — J9600 Acute respiratory failure, unspecified whether with hypoxia or hypercapnia: Secondary | ICD-10-CM | POA: Diagnosis not present

## 2021-06-24 DIAGNOSIS — B181 Chronic viral hepatitis B without delta-agent: Secondary | ICD-10-CM | POA: Diagnosis not present

## 2021-06-24 DIAGNOSIS — Z930 Tracheostomy status: Secondary | ICD-10-CM | POA: Diagnosis not present

## 2021-06-24 DIAGNOSIS — E038 Other specified hypothyroidism: Secondary | ICD-10-CM | POA: Diagnosis not present

## 2021-06-24 DIAGNOSIS — G909 Disorder of the autonomic nervous system, unspecified: Secondary | ICD-10-CM | POA: Diagnosis not present

## 2021-06-24 DIAGNOSIS — J9600 Acute respiratory failure, unspecified whether with hypoxia or hypercapnia: Secondary | ICD-10-CM | POA: Diagnosis not present

## 2021-06-24 DIAGNOSIS — E221 Hyperprolactinemia: Secondary | ICD-10-CM | POA: Diagnosis not present

## 2021-06-24 DIAGNOSIS — R569 Unspecified convulsions: Secondary | ICD-10-CM | POA: Diagnosis not present

## 2021-06-24 DIAGNOSIS — R1319 Other dysphagia: Secondary | ICD-10-CM | POA: Diagnosis not present

## 2021-06-24 DIAGNOSIS — D509 Iron deficiency anemia, unspecified: Secondary | ICD-10-CM | POA: Diagnosis not present

## 2021-06-24 DIAGNOSIS — E039 Hypothyroidism, unspecified: Secondary | ICD-10-CM | POA: Diagnosis not present

## 2021-06-24 DIAGNOSIS — G249 Dystonia, unspecified: Secondary | ICD-10-CM | POA: Diagnosis not present

## 2021-06-24 DIAGNOSIS — E063 Autoimmune thyroiditis: Secondary | ICD-10-CM | POA: Diagnosis not present

## 2021-06-24 DIAGNOSIS — G8250 Quadriplegia, unspecified: Secondary | ICD-10-CM | POA: Diagnosis not present

## 2021-06-24 DIAGNOSIS — G0481 Other encephalitis and encephalomyelitis: Secondary | ICD-10-CM | POA: Diagnosis not present

## 2021-06-25 DIAGNOSIS — G909 Disorder of the autonomic nervous system, unspecified: Secondary | ICD-10-CM | POA: Diagnosis not present

## 2021-06-25 DIAGNOSIS — G0481 Other encephalitis and encephalomyelitis: Secondary | ICD-10-CM | POA: Diagnosis not present

## 2021-06-25 DIAGNOSIS — R1319 Other dysphagia: Secondary | ICD-10-CM | POA: Diagnosis not present

## 2021-06-25 DIAGNOSIS — E038 Other specified hypothyroidism: Secondary | ICD-10-CM | POA: Diagnosis not present

## 2021-06-25 DIAGNOSIS — D509 Iron deficiency anemia, unspecified: Secondary | ICD-10-CM | POA: Diagnosis not present

## 2021-06-25 DIAGNOSIS — J9600 Acute respiratory failure, unspecified whether with hypoxia or hypercapnia: Secondary | ICD-10-CM | POA: Diagnosis not present

## 2021-06-25 DIAGNOSIS — G8250 Quadriplegia, unspecified: Secondary | ICD-10-CM | POA: Diagnosis not present

## 2021-06-25 DIAGNOSIS — R569 Unspecified convulsions: Secondary | ICD-10-CM | POA: Diagnosis not present

## 2021-06-25 DIAGNOSIS — E221 Hyperprolactinemia: Secondary | ICD-10-CM | POA: Diagnosis not present

## 2021-06-25 DIAGNOSIS — B181 Chronic viral hepatitis B without delta-agent: Secondary | ICD-10-CM | POA: Diagnosis not present

## 2021-06-25 DIAGNOSIS — Z930 Tracheostomy status: Secondary | ICD-10-CM | POA: Diagnosis not present

## 2021-06-25 DIAGNOSIS — E063 Autoimmune thyroiditis: Secondary | ICD-10-CM | POA: Diagnosis not present

## 2021-06-26 DIAGNOSIS — E063 Autoimmune thyroiditis: Secondary | ICD-10-CM | POA: Diagnosis not present

## 2021-06-26 DIAGNOSIS — E038 Other specified hypothyroidism: Secondary | ICD-10-CM | POA: Diagnosis not present

## 2021-06-26 DIAGNOSIS — B181 Chronic viral hepatitis B without delta-agent: Secondary | ICD-10-CM | POA: Diagnosis not present

## 2021-06-26 DIAGNOSIS — D509 Iron deficiency anemia, unspecified: Secondary | ICD-10-CM | POA: Diagnosis not present

## 2021-06-26 DIAGNOSIS — G909 Disorder of the autonomic nervous system, unspecified: Secondary | ICD-10-CM | POA: Diagnosis not present

## 2021-06-26 DIAGNOSIS — R1319 Other dysphagia: Secondary | ICD-10-CM | POA: Diagnosis not present

## 2021-06-26 DIAGNOSIS — E221 Hyperprolactinemia: Secondary | ICD-10-CM | POA: Diagnosis not present

## 2021-06-26 DIAGNOSIS — G0481 Other encephalitis and encephalomyelitis: Secondary | ICD-10-CM | POA: Diagnosis not present

## 2021-06-26 DIAGNOSIS — J9600 Acute respiratory failure, unspecified whether with hypoxia or hypercapnia: Secondary | ICD-10-CM | POA: Diagnosis not present

## 2021-06-26 DIAGNOSIS — Z930 Tracheostomy status: Secondary | ICD-10-CM | POA: Diagnosis not present

## 2021-06-26 DIAGNOSIS — R569 Unspecified convulsions: Secondary | ICD-10-CM | POA: Diagnosis not present

## 2021-06-26 DIAGNOSIS — G8250 Quadriplegia, unspecified: Secondary | ICD-10-CM | POA: Diagnosis not present

## 2021-06-27 DIAGNOSIS — G8250 Quadriplegia, unspecified: Secondary | ICD-10-CM | POA: Diagnosis not present

## 2021-06-27 DIAGNOSIS — E063 Autoimmune thyroiditis: Secondary | ICD-10-CM | POA: Diagnosis not present

## 2021-06-27 DIAGNOSIS — R1319 Other dysphagia: Secondary | ICD-10-CM | POA: Diagnosis not present

## 2021-06-27 DIAGNOSIS — Z930 Tracheostomy status: Secondary | ICD-10-CM | POA: Diagnosis not present

## 2021-06-27 DIAGNOSIS — R569 Unspecified convulsions: Secondary | ICD-10-CM | POA: Diagnosis not present

## 2021-06-27 DIAGNOSIS — E221 Hyperprolactinemia: Secondary | ICD-10-CM | POA: Diagnosis not present

## 2021-06-27 DIAGNOSIS — D509 Iron deficiency anemia, unspecified: Secondary | ICD-10-CM | POA: Diagnosis not present

## 2021-06-27 DIAGNOSIS — E038 Other specified hypothyroidism: Secondary | ICD-10-CM | POA: Diagnosis not present

## 2021-06-27 DIAGNOSIS — J9600 Acute respiratory failure, unspecified whether with hypoxia or hypercapnia: Secondary | ICD-10-CM | POA: Diagnosis not present

## 2021-06-27 DIAGNOSIS — B181 Chronic viral hepatitis B without delta-agent: Secondary | ICD-10-CM | POA: Diagnosis not present

## 2021-06-27 DIAGNOSIS — G0481 Other encephalitis and encephalomyelitis: Secondary | ICD-10-CM | POA: Diagnosis not present

## 2021-06-27 DIAGNOSIS — G909 Disorder of the autonomic nervous system, unspecified: Secondary | ICD-10-CM | POA: Diagnosis not present

## 2021-06-28 DIAGNOSIS — G8250 Quadriplegia, unspecified: Secondary | ICD-10-CM | POA: Diagnosis not present

## 2021-06-28 DIAGNOSIS — R569 Unspecified convulsions: Secondary | ICD-10-CM | POA: Diagnosis not present

## 2021-06-28 DIAGNOSIS — G909 Disorder of the autonomic nervous system, unspecified: Secondary | ICD-10-CM | POA: Diagnosis not present

## 2021-06-28 DIAGNOSIS — G0481 Other encephalitis and encephalomyelitis: Secondary | ICD-10-CM | POA: Diagnosis not present

## 2021-06-28 DIAGNOSIS — J9611 Chronic respiratory failure with hypoxia: Secondary | ICD-10-CM | POA: Diagnosis not present

## 2021-06-28 DIAGNOSIS — E063 Autoimmune thyroiditis: Secondary | ICD-10-CM | POA: Diagnosis not present

## 2021-06-28 DIAGNOSIS — D509 Iron deficiency anemia, unspecified: Secondary | ICD-10-CM | POA: Diagnosis not present

## 2021-06-28 DIAGNOSIS — B181 Chronic viral hepatitis B without delta-agent: Secondary | ICD-10-CM | POA: Diagnosis not present

## 2021-06-28 DIAGNOSIS — E221 Hyperprolactinemia: Secondary | ICD-10-CM | POA: Diagnosis not present

## 2021-06-28 DIAGNOSIS — E038 Other specified hypothyroidism: Secondary | ICD-10-CM | POA: Diagnosis not present

## 2021-06-28 DIAGNOSIS — Z930 Tracheostomy status: Secondary | ICD-10-CM | POA: Diagnosis not present

## 2021-06-28 DIAGNOSIS — J9600 Acute respiratory failure, unspecified whether with hypoxia or hypercapnia: Secondary | ICD-10-CM | POA: Diagnosis not present

## 2021-06-28 DIAGNOSIS — R1319 Other dysphagia: Secondary | ICD-10-CM | POA: Diagnosis not present

## 2021-06-29 DIAGNOSIS — R1319 Other dysphagia: Secondary | ICD-10-CM | POA: Diagnosis not present

## 2021-06-29 DIAGNOSIS — B181 Chronic viral hepatitis B without delta-agent: Secondary | ICD-10-CM | POA: Diagnosis not present

## 2021-06-29 DIAGNOSIS — D509 Iron deficiency anemia, unspecified: Secondary | ICD-10-CM | POA: Diagnosis not present

## 2021-06-29 DIAGNOSIS — E063 Autoimmune thyroiditis: Secondary | ICD-10-CM | POA: Diagnosis not present

## 2021-06-29 DIAGNOSIS — E221 Hyperprolactinemia: Secondary | ICD-10-CM | POA: Diagnosis not present

## 2021-06-29 DIAGNOSIS — G0490 Encephalitis and encephalomyelitis, unspecified: Secondary | ICD-10-CM | POA: Diagnosis not present

## 2021-06-29 DIAGNOSIS — R569 Unspecified convulsions: Secondary | ICD-10-CM | POA: Diagnosis not present

## 2021-06-29 DIAGNOSIS — J9600 Acute respiratory failure, unspecified whether with hypoxia or hypercapnia: Secondary | ICD-10-CM | POA: Diagnosis not present

## 2021-06-29 DIAGNOSIS — Z930 Tracheostomy status: Secondary | ICD-10-CM | POA: Diagnosis not present

## 2021-06-29 DIAGNOSIS — G0481 Other encephalitis and encephalomyelitis: Secondary | ICD-10-CM | POA: Diagnosis not present

## 2021-06-29 DIAGNOSIS — E038 Other specified hypothyroidism: Secondary | ICD-10-CM | POA: Diagnosis not present

## 2021-06-29 DIAGNOSIS — G8250 Quadriplegia, unspecified: Secondary | ICD-10-CM | POA: Diagnosis not present

## 2021-06-30 DIAGNOSIS — B181 Chronic viral hepatitis B without delta-agent: Secondary | ICD-10-CM | POA: Diagnosis not present

## 2021-06-30 DIAGNOSIS — E038 Other specified hypothyroidism: Secondary | ICD-10-CM | POA: Diagnosis not present

## 2021-06-30 DIAGNOSIS — D509 Iron deficiency anemia, unspecified: Secondary | ICD-10-CM | POA: Diagnosis not present

## 2021-06-30 DIAGNOSIS — R339 Retention of urine, unspecified: Secondary | ICD-10-CM | POA: Diagnosis not present

## 2021-06-30 DIAGNOSIS — R569 Unspecified convulsions: Secondary | ICD-10-CM | POA: Diagnosis not present

## 2021-06-30 DIAGNOSIS — Z930 Tracheostomy status: Secondary | ICD-10-CM | POA: Diagnosis not present

## 2021-06-30 DIAGNOSIS — E063 Autoimmune thyroiditis: Secondary | ICD-10-CM | POA: Diagnosis not present

## 2021-06-30 DIAGNOSIS — G0481 Other encephalitis and encephalomyelitis: Secondary | ICD-10-CM | POA: Diagnosis not present

## 2021-06-30 DIAGNOSIS — R1310 Dysphagia, unspecified: Secondary | ICD-10-CM | POA: Diagnosis not present

## 2021-06-30 DIAGNOSIS — K5900 Constipation, unspecified: Secondary | ICD-10-CM | POA: Diagnosis not present

## 2021-06-30 DIAGNOSIS — E221 Hyperprolactinemia: Secondary | ICD-10-CM | POA: Diagnosis not present

## 2021-06-30 DIAGNOSIS — J9600 Acute respiratory failure, unspecified whether with hypoxia or hypercapnia: Secondary | ICD-10-CM | POA: Diagnosis not present

## 2021-06-30 DIAGNOSIS — G0490 Encephalitis and encephalomyelitis, unspecified: Secondary | ICD-10-CM | POA: Diagnosis not present

## 2021-07-01 DIAGNOSIS — B181 Chronic viral hepatitis B without delta-agent: Secondary | ICD-10-CM | POA: Diagnosis not present

## 2021-07-01 DIAGNOSIS — E038 Other specified hypothyroidism: Secondary | ICD-10-CM | POA: Diagnosis not present

## 2021-07-01 DIAGNOSIS — E063 Autoimmune thyroiditis: Secondary | ICD-10-CM | POA: Diagnosis not present

## 2021-07-01 DIAGNOSIS — R569 Unspecified convulsions: Secondary | ICD-10-CM | POA: Diagnosis not present

## 2021-07-01 DIAGNOSIS — D509 Iron deficiency anemia, unspecified: Secondary | ICD-10-CM | POA: Diagnosis not present

## 2021-07-01 DIAGNOSIS — G0490 Encephalitis and encephalomyelitis, unspecified: Secondary | ICD-10-CM | POA: Diagnosis not present

## 2021-07-01 DIAGNOSIS — R339 Retention of urine, unspecified: Secondary | ICD-10-CM | POA: Diagnosis not present

## 2021-07-01 DIAGNOSIS — E221 Hyperprolactinemia: Secondary | ICD-10-CM | POA: Diagnosis not present

## 2021-07-01 DIAGNOSIS — G0481 Other encephalitis and encephalomyelitis: Secondary | ICD-10-CM | POA: Diagnosis not present

## 2021-07-01 DIAGNOSIS — J9600 Acute respiratory failure, unspecified whether with hypoxia or hypercapnia: Secondary | ICD-10-CM | POA: Diagnosis not present

## 2021-07-01 DIAGNOSIS — R1310 Dysphagia, unspecified: Secondary | ICD-10-CM | POA: Diagnosis not present

## 2021-07-01 DIAGNOSIS — Z930 Tracheostomy status: Secondary | ICD-10-CM | POA: Diagnosis not present

## 2021-07-02 DIAGNOSIS — B181 Chronic viral hepatitis B without delta-agent: Secondary | ICD-10-CM | POA: Diagnosis not present

## 2021-07-02 DIAGNOSIS — E063 Autoimmune thyroiditis: Secondary | ICD-10-CM | POA: Diagnosis not present

## 2021-07-02 DIAGNOSIS — J9600 Acute respiratory failure, unspecified whether with hypoxia or hypercapnia: Secondary | ICD-10-CM | POA: Diagnosis not present

## 2021-07-02 DIAGNOSIS — R339 Retention of urine, unspecified: Secondary | ICD-10-CM | POA: Diagnosis not present

## 2021-07-02 DIAGNOSIS — G0490 Encephalitis and encephalomyelitis, unspecified: Secondary | ICD-10-CM | POA: Diagnosis not present

## 2021-07-02 DIAGNOSIS — R569 Unspecified convulsions: Secondary | ICD-10-CM | POA: Diagnosis not present

## 2021-07-02 DIAGNOSIS — R1319 Other dysphagia: Secondary | ICD-10-CM | POA: Diagnosis not present

## 2021-07-02 DIAGNOSIS — Z930 Tracheostomy status: Secondary | ICD-10-CM | POA: Diagnosis not present

## 2021-07-02 DIAGNOSIS — G0481 Other encephalitis and encephalomyelitis: Secondary | ICD-10-CM | POA: Diagnosis not present

## 2021-07-02 DIAGNOSIS — E038 Other specified hypothyroidism: Secondary | ICD-10-CM | POA: Diagnosis not present

## 2021-07-02 DIAGNOSIS — E221 Hyperprolactinemia: Secondary | ICD-10-CM | POA: Diagnosis not present

## 2021-07-02 DIAGNOSIS — K219 Gastro-esophageal reflux disease without esophagitis: Secondary | ICD-10-CM | POA: Diagnosis not present

## 2021-07-03 DIAGNOSIS — G0481 Other encephalitis and encephalomyelitis: Secondary | ICD-10-CM | POA: Diagnosis not present

## 2021-07-03 DIAGNOSIS — E063 Autoimmune thyroiditis: Secondary | ICD-10-CM | POA: Diagnosis not present

## 2021-07-03 DIAGNOSIS — Z930 Tracheostomy status: Secondary | ICD-10-CM | POA: Diagnosis not present

## 2021-07-03 DIAGNOSIS — J9600 Acute respiratory failure, unspecified whether with hypoxia or hypercapnia: Secondary | ICD-10-CM | POA: Diagnosis not present

## 2021-07-03 DIAGNOSIS — E039 Hypothyroidism, unspecified: Secondary | ICD-10-CM | POA: Diagnosis not present

## 2021-07-03 DIAGNOSIS — G0490 Encephalitis and encephalomyelitis, unspecified: Secondary | ICD-10-CM | POA: Diagnosis not present

## 2021-07-03 DIAGNOSIS — B181 Chronic viral hepatitis B without delta-agent: Secondary | ICD-10-CM | POA: Diagnosis not present

## 2021-07-03 DIAGNOSIS — R569 Unspecified convulsions: Secondary | ICD-10-CM | POA: Diagnosis not present

## 2021-07-03 DIAGNOSIS — G909 Disorder of the autonomic nervous system, unspecified: Secondary | ICD-10-CM | POA: Diagnosis not present

## 2021-07-03 DIAGNOSIS — R1319 Other dysphagia: Secondary | ICD-10-CM | POA: Diagnosis not present

## 2021-07-03 DIAGNOSIS — E221 Hyperprolactinemia: Secondary | ICD-10-CM | POA: Diagnosis not present

## 2021-07-03 DIAGNOSIS — D509 Iron deficiency anemia, unspecified: Secondary | ICD-10-CM | POA: Diagnosis not present

## 2021-07-04 DIAGNOSIS — G909 Disorder of the autonomic nervous system, unspecified: Secondary | ICD-10-CM | POA: Diagnosis not present

## 2021-07-04 DIAGNOSIS — E039 Hypothyroidism, unspecified: Secondary | ICD-10-CM | POA: Diagnosis not present

## 2021-07-04 DIAGNOSIS — D509 Iron deficiency anemia, unspecified: Secondary | ICD-10-CM | POA: Diagnosis not present

## 2021-07-04 DIAGNOSIS — Z930 Tracheostomy status: Secondary | ICD-10-CM | POA: Diagnosis not present

## 2021-07-04 DIAGNOSIS — R1319 Other dysphagia: Secondary | ICD-10-CM | POA: Diagnosis not present

## 2021-07-04 DIAGNOSIS — B181 Chronic viral hepatitis B without delta-agent: Secondary | ICD-10-CM | POA: Diagnosis not present

## 2021-07-04 DIAGNOSIS — R569 Unspecified convulsions: Secondary | ICD-10-CM | POA: Diagnosis not present

## 2021-07-04 DIAGNOSIS — G0481 Other encephalitis and encephalomyelitis: Secondary | ICD-10-CM | POA: Diagnosis not present

## 2021-07-04 DIAGNOSIS — E221 Hyperprolactinemia: Secondary | ICD-10-CM | POA: Diagnosis not present

## 2021-07-04 DIAGNOSIS — J9600 Acute respiratory failure, unspecified whether with hypoxia or hypercapnia: Secondary | ICD-10-CM | POA: Diagnosis not present

## 2021-07-04 DIAGNOSIS — E063 Autoimmune thyroiditis: Secondary | ICD-10-CM | POA: Diagnosis not present

## 2021-07-05 DIAGNOSIS — D509 Iron deficiency anemia, unspecified: Secondary | ICD-10-CM | POA: Diagnosis not present

## 2021-07-05 DIAGNOSIS — R1319 Other dysphagia: Secondary | ICD-10-CM | POA: Diagnosis not present

## 2021-07-05 DIAGNOSIS — Z931 Gastrostomy status: Secondary | ICD-10-CM | POA: Diagnosis not present

## 2021-07-05 DIAGNOSIS — R000 Tachycardia, unspecified: Secondary | ICD-10-CM | POA: Diagnosis not present

## 2021-07-05 DIAGNOSIS — G909 Disorder of the autonomic nervous system, unspecified: Secondary | ICD-10-CM | POA: Diagnosis not present

## 2021-07-05 DIAGNOSIS — Z930 Tracheostomy status: Secondary | ICD-10-CM | POA: Diagnosis not present

## 2021-07-05 DIAGNOSIS — E221 Hyperprolactinemia: Secondary | ICD-10-CM | POA: Diagnosis not present

## 2021-07-05 DIAGNOSIS — G0481 Other encephalitis and encephalomyelitis: Secondary | ICD-10-CM | POA: Diagnosis not present

## 2021-07-05 DIAGNOSIS — J9601 Acute respiratory failure with hypoxia: Secondary | ICD-10-CM | POA: Diagnosis not present

## 2021-07-05 DIAGNOSIS — E063 Autoimmune thyroiditis: Secondary | ICD-10-CM | POA: Diagnosis not present

## 2021-07-05 DIAGNOSIS — E039 Hypothyroidism, unspecified: Secondary | ICD-10-CM | POA: Diagnosis not present

## 2021-07-06 DIAGNOSIS — Z931 Gastrostomy status: Secondary | ICD-10-CM | POA: Diagnosis not present

## 2021-07-06 DIAGNOSIS — B181 Chronic viral hepatitis B without delta-agent: Secondary | ICD-10-CM | POA: Diagnosis not present

## 2021-07-06 DIAGNOSIS — J9600 Acute respiratory failure, unspecified whether with hypoxia or hypercapnia: Secondary | ICD-10-CM | POA: Diagnosis not present

## 2021-07-06 DIAGNOSIS — E039 Hypothyroidism, unspecified: Secondary | ICD-10-CM | POA: Diagnosis not present

## 2021-07-06 DIAGNOSIS — R000 Tachycardia, unspecified: Secondary | ICD-10-CM | POA: Diagnosis not present

## 2021-07-06 DIAGNOSIS — J9811 Atelectasis: Secondary | ICD-10-CM | POA: Diagnosis not present

## 2021-07-06 DIAGNOSIS — E221 Hyperprolactinemia: Secondary | ICD-10-CM | POA: Diagnosis not present

## 2021-07-06 DIAGNOSIS — Z930 Tracheostomy status: Secondary | ICD-10-CM | POA: Diagnosis not present

## 2021-07-06 DIAGNOSIS — G0481 Other encephalitis and encephalomyelitis: Secondary | ICD-10-CM | POA: Diagnosis not present

## 2021-07-06 DIAGNOSIS — R1319 Other dysphagia: Secondary | ICD-10-CM | POA: Diagnosis not present

## 2021-07-06 DIAGNOSIS — G909 Disorder of the autonomic nervous system, unspecified: Secondary | ICD-10-CM | POA: Diagnosis not present

## 2021-07-06 DIAGNOSIS — E063 Autoimmune thyroiditis: Secondary | ICD-10-CM | POA: Diagnosis not present

## 2021-07-06 DIAGNOSIS — D509 Iron deficiency anemia, unspecified: Secondary | ICD-10-CM | POA: Diagnosis not present

## 2021-07-07 DIAGNOSIS — B181 Chronic viral hepatitis B without delta-agent: Secondary | ICD-10-CM | POA: Diagnosis not present

## 2021-07-07 DIAGNOSIS — E221 Hyperprolactinemia: Secondary | ICD-10-CM | POA: Diagnosis not present

## 2021-07-07 DIAGNOSIS — Z930 Tracheostomy status: Secondary | ICD-10-CM | POA: Diagnosis not present

## 2021-07-07 DIAGNOSIS — E063 Autoimmune thyroiditis: Secondary | ICD-10-CM | POA: Diagnosis not present

## 2021-07-07 DIAGNOSIS — G909 Disorder of the autonomic nervous system, unspecified: Secondary | ICD-10-CM | POA: Diagnosis not present

## 2021-07-07 DIAGNOSIS — G9349 Other encephalopathy: Secondary | ICD-10-CM | POA: Diagnosis not present

## 2021-07-07 DIAGNOSIS — G0481 Other encephalitis and encephalomyelitis: Secondary | ICD-10-CM | POA: Diagnosis not present

## 2021-07-07 DIAGNOSIS — D509 Iron deficiency anemia, unspecified: Secondary | ICD-10-CM | POA: Diagnosis not present

## 2021-07-07 DIAGNOSIS — E039 Hypothyroidism, unspecified: Secondary | ICD-10-CM | POA: Diagnosis not present

## 2021-07-07 DIAGNOSIS — J9600 Acute respiratory failure, unspecified whether with hypoxia or hypercapnia: Secondary | ICD-10-CM | POA: Diagnosis not present

## 2021-07-07 DIAGNOSIS — R1319 Other dysphagia: Secondary | ICD-10-CM | POA: Diagnosis not present

## 2021-07-08 DIAGNOSIS — D509 Iron deficiency anemia, unspecified: Secondary | ICD-10-CM | POA: Diagnosis not present

## 2021-07-08 DIAGNOSIS — E039 Hypothyroidism, unspecified: Secondary | ICD-10-CM | POA: Diagnosis not present

## 2021-07-08 DIAGNOSIS — J9600 Acute respiratory failure, unspecified whether with hypoxia or hypercapnia: Secondary | ICD-10-CM | POA: Diagnosis not present

## 2021-07-08 DIAGNOSIS — E063 Autoimmune thyroiditis: Secondary | ICD-10-CM | POA: Diagnosis not present

## 2021-07-08 DIAGNOSIS — G9349 Other encephalopathy: Secondary | ICD-10-CM | POA: Diagnosis not present

## 2021-07-08 DIAGNOSIS — G909 Disorder of the autonomic nervous system, unspecified: Secondary | ICD-10-CM | POA: Diagnosis not present

## 2021-07-08 DIAGNOSIS — R1319 Other dysphagia: Secondary | ICD-10-CM | POA: Diagnosis not present

## 2021-07-08 DIAGNOSIS — B181 Chronic viral hepatitis B without delta-agent: Secondary | ICD-10-CM | POA: Diagnosis not present

## 2021-07-08 DIAGNOSIS — G0481 Other encephalitis and encephalomyelitis: Secondary | ICD-10-CM | POA: Diagnosis not present

## 2021-07-08 DIAGNOSIS — Z930 Tracheostomy status: Secondary | ICD-10-CM | POA: Diagnosis not present

## 2021-07-08 DIAGNOSIS — E221 Hyperprolactinemia: Secondary | ICD-10-CM | POA: Diagnosis not present

## 2021-07-09 DIAGNOSIS — D509 Iron deficiency anemia, unspecified: Secondary | ICD-10-CM | POA: Diagnosis not present

## 2021-07-09 DIAGNOSIS — B181 Chronic viral hepatitis B without delta-agent: Secondary | ICD-10-CM | POA: Diagnosis not present

## 2021-07-09 DIAGNOSIS — R569 Unspecified convulsions: Secondary | ICD-10-CM | POA: Diagnosis not present

## 2021-07-09 DIAGNOSIS — E221 Hyperprolactinemia: Secondary | ICD-10-CM | POA: Diagnosis not present

## 2021-07-09 DIAGNOSIS — G909 Disorder of the autonomic nervous system, unspecified: Secondary | ICD-10-CM | POA: Diagnosis not present

## 2021-07-09 DIAGNOSIS — Z930 Tracheostomy status: Secondary | ICD-10-CM | POA: Diagnosis not present

## 2021-07-09 DIAGNOSIS — E063 Autoimmune thyroiditis: Secondary | ICD-10-CM | POA: Diagnosis not present

## 2021-07-09 DIAGNOSIS — J9600 Acute respiratory failure, unspecified whether with hypoxia or hypercapnia: Secondary | ICD-10-CM | POA: Diagnosis not present

## 2021-07-09 DIAGNOSIS — E039 Hypothyroidism, unspecified: Secondary | ICD-10-CM | POA: Diagnosis not present

## 2021-07-09 DIAGNOSIS — R1319 Other dysphagia: Secondary | ICD-10-CM | POA: Diagnosis not present

## 2021-07-09 DIAGNOSIS — G0481 Other encephalitis and encephalomyelitis: Secondary | ICD-10-CM | POA: Diagnosis not present

## 2021-07-10 DIAGNOSIS — E063 Autoimmune thyroiditis: Secondary | ICD-10-CM | POA: Diagnosis not present

## 2021-07-10 DIAGNOSIS — J9600 Acute respiratory failure, unspecified whether with hypoxia or hypercapnia: Secondary | ICD-10-CM | POA: Diagnosis not present

## 2021-07-10 DIAGNOSIS — Z930 Tracheostomy status: Secondary | ICD-10-CM | POA: Diagnosis not present

## 2021-07-10 DIAGNOSIS — R1319 Other dysphagia: Secondary | ICD-10-CM | POA: Diagnosis not present

## 2021-07-10 DIAGNOSIS — D509 Iron deficiency anemia, unspecified: Secondary | ICD-10-CM | POA: Diagnosis not present

## 2021-07-10 DIAGNOSIS — B181 Chronic viral hepatitis B without delta-agent: Secondary | ICD-10-CM | POA: Diagnosis not present

## 2021-07-10 DIAGNOSIS — E221 Hyperprolactinemia: Secondary | ICD-10-CM | POA: Diagnosis not present

## 2021-07-10 DIAGNOSIS — G909 Disorder of the autonomic nervous system, unspecified: Secondary | ICD-10-CM | POA: Diagnosis not present

## 2021-07-10 DIAGNOSIS — E039 Hypothyroidism, unspecified: Secondary | ICD-10-CM | POA: Diagnosis not present

## 2021-07-10 DIAGNOSIS — R569 Unspecified convulsions: Secondary | ICD-10-CM | POA: Diagnosis not present

## 2021-07-10 DIAGNOSIS — G0481 Other encephalitis and encephalomyelitis: Secondary | ICD-10-CM | POA: Diagnosis not present

## 2021-07-11 DIAGNOSIS — Z930 Tracheostomy status: Secondary | ICD-10-CM | POA: Diagnosis not present

## 2021-07-11 DIAGNOSIS — J9600 Acute respiratory failure, unspecified whether with hypoxia or hypercapnia: Secondary | ICD-10-CM | POA: Diagnosis not present

## 2021-07-11 DIAGNOSIS — B181 Chronic viral hepatitis B without delta-agent: Secondary | ICD-10-CM | POA: Diagnosis not present

## 2021-07-11 DIAGNOSIS — E221 Hyperprolactinemia: Secondary | ICD-10-CM | POA: Diagnosis not present

## 2021-07-11 DIAGNOSIS — E063 Autoimmune thyroiditis: Secondary | ICD-10-CM | POA: Diagnosis not present

## 2021-07-11 DIAGNOSIS — G909 Disorder of the autonomic nervous system, unspecified: Secondary | ICD-10-CM | POA: Diagnosis not present

## 2021-07-11 DIAGNOSIS — G0481 Other encephalitis and encephalomyelitis: Secondary | ICD-10-CM | POA: Diagnosis not present

## 2021-07-11 DIAGNOSIS — E039 Hypothyroidism, unspecified: Secondary | ICD-10-CM | POA: Diagnosis not present

## 2021-07-11 DIAGNOSIS — R1319 Other dysphagia: Secondary | ICD-10-CM | POA: Diagnosis not present

## 2021-07-11 DIAGNOSIS — D509 Iron deficiency anemia, unspecified: Secondary | ICD-10-CM | POA: Diagnosis not present

## 2021-07-12 DIAGNOSIS — D509 Iron deficiency anemia, unspecified: Secondary | ICD-10-CM | POA: Diagnosis not present

## 2021-07-12 DIAGNOSIS — G909 Disorder of the autonomic nervous system, unspecified: Secondary | ICD-10-CM | POA: Diagnosis not present

## 2021-07-12 DIAGNOSIS — B181 Chronic viral hepatitis B without delta-agent: Secondary | ICD-10-CM | POA: Diagnosis not present

## 2021-07-12 DIAGNOSIS — E063 Autoimmune thyroiditis: Secondary | ICD-10-CM | POA: Diagnosis not present

## 2021-07-12 DIAGNOSIS — E221 Hyperprolactinemia: Secondary | ICD-10-CM | POA: Diagnosis not present

## 2021-07-12 DIAGNOSIS — J9600 Acute respiratory failure, unspecified whether with hypoxia or hypercapnia: Secondary | ICD-10-CM | POA: Diagnosis not present

## 2021-07-12 DIAGNOSIS — G0481 Other encephalitis and encephalomyelitis: Secondary | ICD-10-CM | POA: Diagnosis not present

## 2021-07-12 DIAGNOSIS — R1319 Other dysphagia: Secondary | ICD-10-CM | POA: Diagnosis not present

## 2021-07-12 DIAGNOSIS — E039 Hypothyroidism, unspecified: Secondary | ICD-10-CM | POA: Diagnosis not present

## 2021-07-12 DIAGNOSIS — Z930 Tracheostomy status: Secondary | ICD-10-CM | POA: Diagnosis not present

## 2021-07-13 DIAGNOSIS — E063 Autoimmune thyroiditis: Secondary | ICD-10-CM | POA: Diagnosis not present

## 2021-07-13 DIAGNOSIS — R1319 Other dysphagia: Secondary | ICD-10-CM | POA: Diagnosis not present

## 2021-07-13 DIAGNOSIS — G909 Disorder of the autonomic nervous system, unspecified: Secondary | ICD-10-CM | POA: Diagnosis not present

## 2021-07-13 DIAGNOSIS — E039 Hypothyroidism, unspecified: Secondary | ICD-10-CM | POA: Diagnosis not present

## 2021-07-13 DIAGNOSIS — J9600 Acute respiratory failure, unspecified whether with hypoxia or hypercapnia: Secondary | ICD-10-CM | POA: Diagnosis not present

## 2021-07-13 DIAGNOSIS — B181 Chronic viral hepatitis B without delta-agent: Secondary | ICD-10-CM | POA: Diagnosis not present

## 2021-07-13 DIAGNOSIS — G0481 Other encephalitis and encephalomyelitis: Secondary | ICD-10-CM | POA: Diagnosis not present

## 2021-07-13 DIAGNOSIS — Z930 Tracheostomy status: Secondary | ICD-10-CM | POA: Diagnosis not present

## 2021-07-13 DIAGNOSIS — D509 Iron deficiency anemia, unspecified: Secondary | ICD-10-CM | POA: Diagnosis not present

## 2021-07-13 DIAGNOSIS — E221 Hyperprolactinemia: Secondary | ICD-10-CM | POA: Diagnosis not present

## 2021-07-14 DIAGNOSIS — E063 Autoimmune thyroiditis: Secondary | ICD-10-CM | POA: Diagnosis not present

## 2021-07-14 DIAGNOSIS — B181 Chronic viral hepatitis B without delta-agent: Secondary | ICD-10-CM | POA: Diagnosis not present

## 2021-07-14 DIAGNOSIS — D509 Iron deficiency anemia, unspecified: Secondary | ICD-10-CM | POA: Diagnosis not present

## 2021-07-14 DIAGNOSIS — Z931 Gastrostomy status: Secondary | ICD-10-CM | POA: Diagnosis not present

## 2021-07-14 DIAGNOSIS — G909 Disorder of the autonomic nervous system, unspecified: Secondary | ICD-10-CM | POA: Diagnosis not present

## 2021-07-14 DIAGNOSIS — R1319 Other dysphagia: Secondary | ICD-10-CM | POA: Diagnosis not present

## 2021-07-14 DIAGNOSIS — J9601 Acute respiratory failure with hypoxia: Secondary | ICD-10-CM | POA: Diagnosis not present

## 2021-07-14 DIAGNOSIS — E039 Hypothyroidism, unspecified: Secondary | ICD-10-CM | POA: Diagnosis not present

## 2021-07-14 DIAGNOSIS — G0481 Other encephalitis and encephalomyelitis: Secondary | ICD-10-CM | POA: Diagnosis not present

## 2021-07-14 DIAGNOSIS — E221 Hyperprolactinemia: Secondary | ICD-10-CM | POA: Diagnosis not present

## 2021-07-14 DIAGNOSIS — Z930 Tracheostomy status: Secondary | ICD-10-CM | POA: Diagnosis not present

## 2021-07-15 DIAGNOSIS — B181 Chronic viral hepatitis B without delta-agent: Secondary | ICD-10-CM | POA: Diagnosis not present

## 2021-07-15 DIAGNOSIS — D509 Iron deficiency anemia, unspecified: Secondary | ICD-10-CM | POA: Diagnosis not present

## 2021-07-15 DIAGNOSIS — R1319 Other dysphagia: Secondary | ICD-10-CM | POA: Diagnosis not present

## 2021-07-15 DIAGNOSIS — E221 Hyperprolactinemia: Secondary | ICD-10-CM | POA: Diagnosis not present

## 2021-07-15 DIAGNOSIS — G0481 Other encephalitis and encephalomyelitis: Secondary | ICD-10-CM | POA: Diagnosis not present

## 2021-07-15 DIAGNOSIS — J9600 Acute respiratory failure, unspecified whether with hypoxia or hypercapnia: Secondary | ICD-10-CM | POA: Diagnosis not present

## 2021-07-15 DIAGNOSIS — E039 Hypothyroidism, unspecified: Secondary | ICD-10-CM | POA: Diagnosis not present

## 2021-07-15 DIAGNOSIS — E063 Autoimmune thyroiditis: Secondary | ICD-10-CM | POA: Diagnosis not present

## 2021-07-15 DIAGNOSIS — Z930 Tracheostomy status: Secondary | ICD-10-CM | POA: Diagnosis not present

## 2021-07-15 DIAGNOSIS — G909 Disorder of the autonomic nervous system, unspecified: Secondary | ICD-10-CM | POA: Diagnosis not present

## 2021-07-16 DIAGNOSIS — D509 Iron deficiency anemia, unspecified: Secondary | ICD-10-CM | POA: Diagnosis not present

## 2021-07-16 DIAGNOSIS — R1319 Other dysphagia: Secondary | ICD-10-CM | POA: Diagnosis not present

## 2021-07-16 DIAGNOSIS — E039 Hypothyroidism, unspecified: Secondary | ICD-10-CM | POA: Diagnosis not present

## 2021-07-16 DIAGNOSIS — Z930 Tracheostomy status: Secondary | ICD-10-CM | POA: Diagnosis not present

## 2021-07-16 DIAGNOSIS — B181 Chronic viral hepatitis B without delta-agent: Secondary | ICD-10-CM | POA: Diagnosis not present

## 2021-07-16 DIAGNOSIS — R569 Unspecified convulsions: Secondary | ICD-10-CM | POA: Diagnosis not present

## 2021-07-16 DIAGNOSIS — J9601 Acute respiratory failure with hypoxia: Secondary | ICD-10-CM | POA: Diagnosis not present

## 2021-07-16 DIAGNOSIS — G0481 Other encephalitis and encephalomyelitis: Secondary | ICD-10-CM | POA: Diagnosis not present

## 2021-07-16 DIAGNOSIS — E063 Autoimmune thyroiditis: Secondary | ICD-10-CM | POA: Diagnosis not present

## 2021-07-16 DIAGNOSIS — G909 Disorder of the autonomic nervous system, unspecified: Secondary | ICD-10-CM | POA: Diagnosis not present

## 2021-07-16 DIAGNOSIS — Z931 Gastrostomy status: Secondary | ICD-10-CM | POA: Diagnosis not present

## 2021-07-16 DIAGNOSIS — E221 Hyperprolactinemia: Secondary | ICD-10-CM | POA: Diagnosis not present

## 2021-07-16 DIAGNOSIS — R9401 Abnormal electroencephalogram [EEG]: Secondary | ICD-10-CM | POA: Diagnosis not present

## 2021-07-17 DIAGNOSIS — G909 Disorder of the autonomic nervous system, unspecified: Secondary | ICD-10-CM | POA: Diagnosis not present

## 2021-07-17 DIAGNOSIS — E039 Hypothyroidism, unspecified: Secondary | ICD-10-CM | POA: Diagnosis not present

## 2021-07-17 DIAGNOSIS — G0481 Other encephalitis and encephalomyelitis: Secondary | ICD-10-CM | POA: Diagnosis not present

## 2021-07-17 DIAGNOSIS — Z930 Tracheostomy status: Secondary | ICD-10-CM | POA: Diagnosis not present

## 2021-07-17 DIAGNOSIS — R1319 Other dysphagia: Secondary | ICD-10-CM | POA: Diagnosis not present

## 2021-07-17 DIAGNOSIS — E221 Hyperprolactinemia: Secondary | ICD-10-CM | POA: Diagnosis not present

## 2021-07-17 DIAGNOSIS — J9601 Acute respiratory failure with hypoxia: Secondary | ICD-10-CM | POA: Diagnosis not present

## 2021-07-17 DIAGNOSIS — E063 Autoimmune thyroiditis: Secondary | ICD-10-CM | POA: Diagnosis not present

## 2021-07-17 DIAGNOSIS — D509 Iron deficiency anemia, unspecified: Secondary | ICD-10-CM | POA: Diagnosis not present

## 2021-07-17 DIAGNOSIS — B181 Chronic viral hepatitis B without delta-agent: Secondary | ICD-10-CM | POA: Diagnosis not present

## 2021-07-17 DIAGNOSIS — R569 Unspecified convulsions: Secondary | ICD-10-CM | POA: Diagnosis not present

## 2021-07-17 DIAGNOSIS — Z931 Gastrostomy status: Secondary | ICD-10-CM | POA: Diagnosis not present

## 2021-07-18 DIAGNOSIS — D509 Iron deficiency anemia, unspecified: Secondary | ICD-10-CM | POA: Diagnosis not present

## 2021-07-18 DIAGNOSIS — E039 Hypothyroidism, unspecified: Secondary | ICD-10-CM | POA: Diagnosis not present

## 2021-07-18 DIAGNOSIS — E221 Hyperprolactinemia: Secondary | ICD-10-CM | POA: Diagnosis not present

## 2021-07-18 DIAGNOSIS — J9601 Acute respiratory failure with hypoxia: Secondary | ICD-10-CM | POA: Diagnosis not present

## 2021-07-18 DIAGNOSIS — E063 Autoimmune thyroiditis: Secondary | ICD-10-CM | POA: Diagnosis not present

## 2021-07-18 DIAGNOSIS — R1319 Other dysphagia: Secondary | ICD-10-CM | POA: Diagnosis not present

## 2021-07-18 DIAGNOSIS — G909 Disorder of the autonomic nervous system, unspecified: Secondary | ICD-10-CM | POA: Diagnosis not present

## 2021-07-18 DIAGNOSIS — G9349 Other encephalopathy: Secondary | ICD-10-CM | POA: Diagnosis not present

## 2021-07-18 DIAGNOSIS — B181 Chronic viral hepatitis B without delta-agent: Secondary | ICD-10-CM | POA: Diagnosis not present

## 2021-07-18 DIAGNOSIS — Z930 Tracheostomy status: Secondary | ICD-10-CM | POA: Diagnosis not present

## 2021-07-18 DIAGNOSIS — R569 Unspecified convulsions: Secondary | ICD-10-CM | POA: Diagnosis not present

## 2021-07-18 DIAGNOSIS — G0481 Other encephalitis and encephalomyelitis: Secondary | ICD-10-CM | POA: Diagnosis not present

## 2021-07-18 DIAGNOSIS — Z931 Gastrostomy status: Secondary | ICD-10-CM | POA: Diagnosis not present

## 2021-07-19 DIAGNOSIS — G909 Disorder of the autonomic nervous system, unspecified: Secondary | ICD-10-CM | POA: Diagnosis not present

## 2021-07-19 DIAGNOSIS — R1319 Other dysphagia: Secondary | ICD-10-CM | POA: Diagnosis not present

## 2021-07-19 DIAGNOSIS — Z931 Gastrostomy status: Secondary | ICD-10-CM | POA: Diagnosis not present

## 2021-07-19 DIAGNOSIS — R569 Unspecified convulsions: Secondary | ICD-10-CM | POA: Diagnosis not present

## 2021-07-19 DIAGNOSIS — R918 Other nonspecific abnormal finding of lung field: Secondary | ICD-10-CM | POA: Diagnosis not present

## 2021-07-19 DIAGNOSIS — G0481 Other encephalitis and encephalomyelitis: Secondary | ICD-10-CM | POA: Diagnosis not present

## 2021-07-19 DIAGNOSIS — D509 Iron deficiency anemia, unspecified: Secondary | ICD-10-CM | POA: Diagnosis not present

## 2021-07-19 DIAGNOSIS — Z930 Tracheostomy status: Secondary | ICD-10-CM | POA: Diagnosis not present

## 2021-07-19 DIAGNOSIS — J9601 Acute respiratory failure with hypoxia: Secondary | ICD-10-CM | POA: Diagnosis not present

## 2021-07-19 DIAGNOSIS — E063 Autoimmune thyroiditis: Secondary | ICD-10-CM | POA: Diagnosis not present

## 2021-07-19 DIAGNOSIS — E039 Hypothyroidism, unspecified: Secondary | ICD-10-CM | POA: Diagnosis not present

## 2021-07-19 DIAGNOSIS — E221 Hyperprolactinemia: Secondary | ICD-10-CM | POA: Diagnosis not present

## 2021-07-19 DIAGNOSIS — B181 Chronic viral hepatitis B without delta-agent: Secondary | ICD-10-CM | POA: Diagnosis not present

## 2021-07-20 DIAGNOSIS — D509 Iron deficiency anemia, unspecified: Secondary | ICD-10-CM | POA: Diagnosis not present

## 2021-07-20 DIAGNOSIS — E063 Autoimmune thyroiditis: Secondary | ICD-10-CM | POA: Diagnosis not present

## 2021-07-20 DIAGNOSIS — G9349 Other encephalopathy: Secondary | ICD-10-CM | POA: Diagnosis not present

## 2021-07-20 DIAGNOSIS — E221 Hyperprolactinemia: Secondary | ICD-10-CM | POA: Diagnosis not present

## 2021-07-20 DIAGNOSIS — B181 Chronic viral hepatitis B without delta-agent: Secondary | ICD-10-CM | POA: Diagnosis not present

## 2021-07-20 DIAGNOSIS — G909 Disorder of the autonomic nervous system, unspecified: Secondary | ICD-10-CM | POA: Diagnosis not present

## 2021-07-20 DIAGNOSIS — J9600 Acute respiratory failure, unspecified whether with hypoxia or hypercapnia: Secondary | ICD-10-CM | POA: Diagnosis not present

## 2021-07-20 DIAGNOSIS — E039 Hypothyroidism, unspecified: Secondary | ICD-10-CM | POA: Diagnosis not present

## 2021-07-20 DIAGNOSIS — Z930 Tracheostomy status: Secondary | ICD-10-CM | POA: Diagnosis not present

## 2021-07-20 DIAGNOSIS — R1319 Other dysphagia: Secondary | ICD-10-CM | POA: Diagnosis not present

## 2021-07-20 DIAGNOSIS — Z931 Gastrostomy status: Secondary | ICD-10-CM | POA: Diagnosis not present

## 2021-07-20 DIAGNOSIS — G0481 Other encephalitis and encephalomyelitis: Secondary | ICD-10-CM | POA: Diagnosis not present

## 2021-07-21 DIAGNOSIS — G909 Disorder of the autonomic nervous system, unspecified: Secondary | ICD-10-CM | POA: Diagnosis not present

## 2021-07-21 DIAGNOSIS — R000 Tachycardia, unspecified: Secondary | ICD-10-CM | POA: Diagnosis not present

## 2021-07-21 DIAGNOSIS — G0481 Other encephalitis and encephalomyelitis: Secondary | ICD-10-CM | POA: Diagnosis not present

## 2021-07-21 DIAGNOSIS — Z931 Gastrostomy status: Secondary | ICD-10-CM | POA: Diagnosis not present

## 2021-07-21 DIAGNOSIS — R5081 Fever presenting with conditions classified elsewhere: Secondary | ICD-10-CM | POA: Diagnosis not present

## 2021-07-21 DIAGNOSIS — R1319 Other dysphagia: Secondary | ICD-10-CM | POA: Diagnosis not present

## 2021-07-21 DIAGNOSIS — R339 Retention of urine, unspecified: Secondary | ICD-10-CM | POA: Diagnosis not present

## 2021-07-21 DIAGNOSIS — E039 Hypothyroidism, unspecified: Secondary | ICD-10-CM | POA: Diagnosis not present

## 2021-07-21 DIAGNOSIS — D509 Iron deficiency anemia, unspecified: Secondary | ICD-10-CM | POA: Diagnosis not present

## 2021-07-21 DIAGNOSIS — R918 Other nonspecific abnormal finding of lung field: Secondary | ICD-10-CM | POA: Diagnosis not present

## 2021-07-21 DIAGNOSIS — E063 Autoimmune thyroiditis: Secondary | ICD-10-CM | POA: Diagnosis not present

## 2021-07-21 DIAGNOSIS — E221 Hyperprolactinemia: Secondary | ICD-10-CM | POA: Diagnosis not present

## 2021-07-21 DIAGNOSIS — G9349 Other encephalopathy: Secondary | ICD-10-CM | POA: Diagnosis not present

## 2021-07-21 DIAGNOSIS — Z930 Tracheostomy status: Secondary | ICD-10-CM | POA: Diagnosis not present

## 2021-07-22 DIAGNOSIS — Z931 Gastrostomy status: Secondary | ICD-10-CM | POA: Diagnosis not present

## 2021-07-22 DIAGNOSIS — B9620 Unspecified Escherichia coli [E. coli] as the cause of diseases classified elsewhere: Secondary | ICD-10-CM | POA: Diagnosis not present

## 2021-07-22 DIAGNOSIS — D509 Iron deficiency anemia, unspecified: Secondary | ICD-10-CM | POA: Diagnosis not present

## 2021-07-22 DIAGNOSIS — J9601 Acute respiratory failure with hypoxia: Secondary | ICD-10-CM | POA: Diagnosis not present

## 2021-07-22 DIAGNOSIS — G0481 Other encephalitis and encephalomyelitis: Secondary | ICD-10-CM | POA: Diagnosis not present

## 2021-07-22 DIAGNOSIS — R1319 Other dysphagia: Secondary | ICD-10-CM | POA: Diagnosis not present

## 2021-07-22 DIAGNOSIS — J189 Pneumonia, unspecified organism: Secondary | ICD-10-CM | POA: Diagnosis not present

## 2021-07-22 DIAGNOSIS — R339 Retention of urine, unspecified: Secondary | ICD-10-CM | POA: Diagnosis not present

## 2021-07-22 DIAGNOSIS — Z930 Tracheostomy status: Secondary | ICD-10-CM | POA: Diagnosis not present

## 2021-07-22 DIAGNOSIS — G909 Disorder of the autonomic nervous system, unspecified: Secondary | ICD-10-CM | POA: Diagnosis not present

## 2021-07-22 DIAGNOSIS — N390 Urinary tract infection, site not specified: Secondary | ICD-10-CM | POA: Diagnosis not present

## 2021-07-22 DIAGNOSIS — R569 Unspecified convulsions: Secondary | ICD-10-CM | POA: Diagnosis not present

## 2021-07-23 DIAGNOSIS — E039 Hypothyroidism, unspecified: Secondary | ICD-10-CM | POA: Diagnosis not present

## 2021-07-23 DIAGNOSIS — Z930 Tracheostomy status: Secondary | ICD-10-CM | POA: Diagnosis not present

## 2021-07-23 DIAGNOSIS — Y95 Nosocomial condition: Secondary | ICD-10-CM | POA: Diagnosis not present

## 2021-07-23 DIAGNOSIS — D509 Iron deficiency anemia, unspecified: Secondary | ICD-10-CM | POA: Diagnosis not present

## 2021-07-23 DIAGNOSIS — J9600 Acute respiratory failure, unspecified whether with hypoxia or hypercapnia: Secondary | ICD-10-CM | POA: Diagnosis not present

## 2021-07-23 DIAGNOSIS — N390 Urinary tract infection, site not specified: Secondary | ICD-10-CM | POA: Diagnosis not present

## 2021-07-23 DIAGNOSIS — G0481 Other encephalitis and encephalomyelitis: Secondary | ICD-10-CM | POA: Diagnosis not present

## 2021-07-23 DIAGNOSIS — Z931 Gastrostomy status: Secondary | ICD-10-CM | POA: Diagnosis not present

## 2021-07-23 DIAGNOSIS — J151 Pneumonia due to Pseudomonas: Secondary | ICD-10-CM | POA: Diagnosis not present

## 2021-07-23 DIAGNOSIS — B9620 Unspecified Escherichia coli [E. coli] as the cause of diseases classified elsewhere: Secondary | ICD-10-CM | POA: Diagnosis not present

## 2021-07-23 DIAGNOSIS — G909 Disorder of the autonomic nervous system, unspecified: Secondary | ICD-10-CM | POA: Diagnosis not present

## 2021-07-23 DIAGNOSIS — N3090 Cystitis, unspecified without hematuria: Secondary | ICD-10-CM | POA: Diagnosis not present

## 2021-07-23 DIAGNOSIS — R1319 Other dysphagia: Secondary | ICD-10-CM | POA: Diagnosis not present

## 2021-07-23 DIAGNOSIS — R569 Unspecified convulsions: Secondary | ICD-10-CM | POA: Diagnosis not present

## 2021-07-24 DIAGNOSIS — Z930 Tracheostomy status: Secondary | ICD-10-CM | POA: Diagnosis not present

## 2021-07-24 DIAGNOSIS — R1319 Other dysphagia: Secondary | ICD-10-CM | POA: Diagnosis not present

## 2021-07-24 DIAGNOSIS — J9600 Acute respiratory failure, unspecified whether with hypoxia or hypercapnia: Secondary | ICD-10-CM | POA: Diagnosis not present

## 2021-07-24 DIAGNOSIS — G909 Disorder of the autonomic nervous system, unspecified: Secondary | ICD-10-CM | POA: Diagnosis not present

## 2021-07-24 DIAGNOSIS — G0481 Other encephalitis and encephalomyelitis: Secondary | ICD-10-CM | POA: Diagnosis not present

## 2021-07-24 DIAGNOSIS — N390 Urinary tract infection, site not specified: Secondary | ICD-10-CM | POA: Diagnosis not present

## 2021-07-24 DIAGNOSIS — E039 Hypothyroidism, unspecified: Secondary | ICD-10-CM | POA: Diagnosis not present

## 2021-07-24 DIAGNOSIS — J151 Pneumonia due to Pseudomonas: Secondary | ICD-10-CM | POA: Diagnosis not present

## 2021-07-24 DIAGNOSIS — R569 Unspecified convulsions: Secondary | ICD-10-CM | POA: Diagnosis not present

## 2021-07-24 DIAGNOSIS — B9620 Unspecified Escherichia coli [E. coli] as the cause of diseases classified elsewhere: Secondary | ICD-10-CM | POA: Diagnosis not present

## 2021-07-24 DIAGNOSIS — Y95 Nosocomial condition: Secondary | ICD-10-CM | POA: Diagnosis not present

## 2021-07-24 DIAGNOSIS — E063 Autoimmune thyroiditis: Secondary | ICD-10-CM | POA: Diagnosis not present

## 2021-07-25 DIAGNOSIS — R569 Unspecified convulsions: Secondary | ICD-10-CM | POA: Diagnosis not present

## 2021-07-25 DIAGNOSIS — J9600 Acute respiratory failure, unspecified whether with hypoxia or hypercapnia: Secondary | ICD-10-CM | POA: Diagnosis not present

## 2021-07-25 DIAGNOSIS — E039 Hypothyroidism, unspecified: Secondary | ICD-10-CM | POA: Diagnosis not present

## 2021-07-25 DIAGNOSIS — J151 Pneumonia due to Pseudomonas: Secondary | ICD-10-CM | POA: Diagnosis not present

## 2021-07-25 DIAGNOSIS — N390 Urinary tract infection, site not specified: Secondary | ICD-10-CM | POA: Diagnosis not present

## 2021-07-25 DIAGNOSIS — G0481 Other encephalitis and encephalomyelitis: Secondary | ICD-10-CM | POA: Diagnosis not present

## 2021-07-25 DIAGNOSIS — G909 Disorder of the autonomic nervous system, unspecified: Secondary | ICD-10-CM | POA: Diagnosis not present

## 2021-07-25 DIAGNOSIS — R1319 Other dysphagia: Secondary | ICD-10-CM | POA: Diagnosis not present

## 2021-07-25 DIAGNOSIS — B9620 Unspecified Escherichia coli [E. coli] as the cause of diseases classified elsewhere: Secondary | ICD-10-CM | POA: Diagnosis not present

## 2021-07-25 DIAGNOSIS — E063 Autoimmune thyroiditis: Secondary | ICD-10-CM | POA: Diagnosis not present

## 2021-07-25 DIAGNOSIS — Z930 Tracheostomy status: Secondary | ICD-10-CM | POA: Diagnosis not present

## 2021-07-25 DIAGNOSIS — Y95 Nosocomial condition: Secondary | ICD-10-CM | POA: Diagnosis not present

## 2021-07-26 DIAGNOSIS — B9620 Unspecified Escherichia coli [E. coli] as the cause of diseases classified elsewhere: Secondary | ICD-10-CM | POA: Diagnosis not present

## 2021-07-26 DIAGNOSIS — J151 Pneumonia due to Pseudomonas: Secondary | ICD-10-CM | POA: Diagnosis not present

## 2021-07-26 DIAGNOSIS — E063 Autoimmune thyroiditis: Secondary | ICD-10-CM | POA: Diagnosis not present

## 2021-07-26 DIAGNOSIS — Z931 Gastrostomy status: Secondary | ICD-10-CM | POA: Diagnosis not present

## 2021-07-26 DIAGNOSIS — J9600 Acute respiratory failure, unspecified whether with hypoxia or hypercapnia: Secondary | ICD-10-CM | POA: Diagnosis not present

## 2021-07-26 DIAGNOSIS — E039 Hypothyroidism, unspecified: Secondary | ICD-10-CM | POA: Diagnosis not present

## 2021-07-26 DIAGNOSIS — Z930 Tracheostomy status: Secondary | ICD-10-CM | POA: Diagnosis not present

## 2021-07-26 DIAGNOSIS — N3090 Cystitis, unspecified without hematuria: Secondary | ICD-10-CM | POA: Diagnosis not present

## 2021-07-26 DIAGNOSIS — G909 Disorder of the autonomic nervous system, unspecified: Secondary | ICD-10-CM | POA: Diagnosis not present

## 2021-07-26 DIAGNOSIS — R1319 Other dysphagia: Secondary | ICD-10-CM | POA: Diagnosis not present

## 2021-07-26 DIAGNOSIS — Y95 Nosocomial condition: Secondary | ICD-10-CM | POA: Diagnosis not present

## 2021-07-26 DIAGNOSIS — R569 Unspecified convulsions: Secondary | ICD-10-CM | POA: Diagnosis not present

## 2021-07-26 DIAGNOSIS — G0481 Other encephalitis and encephalomyelitis: Secondary | ICD-10-CM | POA: Diagnosis not present

## 2021-07-26 DIAGNOSIS — N390 Urinary tract infection, site not specified: Secondary | ICD-10-CM | POA: Diagnosis not present

## 2021-07-27 DIAGNOSIS — G0481 Other encephalitis and encephalomyelitis: Secondary | ICD-10-CM | POA: Diagnosis not present

## 2021-07-27 DIAGNOSIS — J9600 Acute respiratory failure, unspecified whether with hypoxia or hypercapnia: Secondary | ICD-10-CM | POA: Diagnosis not present

## 2021-07-27 DIAGNOSIS — E063 Autoimmune thyroiditis: Secondary | ICD-10-CM | POA: Diagnosis not present

## 2021-07-27 DIAGNOSIS — N390 Urinary tract infection, site not specified: Secondary | ICD-10-CM | POA: Diagnosis not present

## 2021-07-27 DIAGNOSIS — Y95 Nosocomial condition: Secondary | ICD-10-CM | POA: Diagnosis not present

## 2021-07-27 DIAGNOSIS — Z930 Tracheostomy status: Secondary | ICD-10-CM | POA: Diagnosis not present

## 2021-07-27 DIAGNOSIS — E039 Hypothyroidism, unspecified: Secondary | ICD-10-CM | POA: Diagnosis not present

## 2021-07-27 DIAGNOSIS — B9620 Unspecified Escherichia coli [E. coli] as the cause of diseases classified elsewhere: Secondary | ICD-10-CM | POA: Diagnosis not present

## 2021-07-27 DIAGNOSIS — R1319 Other dysphagia: Secondary | ICD-10-CM | POA: Diagnosis not present

## 2021-07-27 DIAGNOSIS — J151 Pneumonia due to Pseudomonas: Secondary | ICD-10-CM | POA: Diagnosis not present

## 2021-07-27 DIAGNOSIS — R569 Unspecified convulsions: Secondary | ICD-10-CM | POA: Diagnosis not present

## 2021-07-27 DIAGNOSIS — G909 Disorder of the autonomic nervous system, unspecified: Secondary | ICD-10-CM | POA: Diagnosis not present

## 2021-07-28 DIAGNOSIS — R569 Unspecified convulsions: Secondary | ICD-10-CM | POA: Diagnosis not present

## 2021-07-28 DIAGNOSIS — R1319 Other dysphagia: Secondary | ICD-10-CM | POA: Diagnosis not present

## 2021-07-28 DIAGNOSIS — Z930 Tracheostomy status: Secondary | ICD-10-CM | POA: Diagnosis not present

## 2021-07-28 DIAGNOSIS — J151 Pneumonia due to Pseudomonas: Secondary | ICD-10-CM | POA: Diagnosis not present

## 2021-07-28 DIAGNOSIS — Y95 Nosocomial condition: Secondary | ICD-10-CM | POA: Diagnosis not present

## 2021-07-28 DIAGNOSIS — E039 Hypothyroidism, unspecified: Secondary | ICD-10-CM | POA: Diagnosis not present

## 2021-07-28 DIAGNOSIS — N390 Urinary tract infection, site not specified: Secondary | ICD-10-CM | POA: Diagnosis not present

## 2021-07-28 DIAGNOSIS — E063 Autoimmune thyroiditis: Secondary | ICD-10-CM | POA: Diagnosis not present

## 2021-07-28 DIAGNOSIS — G909 Disorder of the autonomic nervous system, unspecified: Secondary | ICD-10-CM | POA: Diagnosis not present

## 2021-07-28 DIAGNOSIS — G0481 Other encephalitis and encephalomyelitis: Secondary | ICD-10-CM | POA: Diagnosis not present

## 2021-07-28 DIAGNOSIS — J9600 Acute respiratory failure, unspecified whether with hypoxia or hypercapnia: Secondary | ICD-10-CM | POA: Diagnosis not present

## 2021-07-28 DIAGNOSIS — B9620 Unspecified Escherichia coli [E. coli] as the cause of diseases classified elsewhere: Secondary | ICD-10-CM | POA: Diagnosis not present

## 2021-07-29 DIAGNOSIS — R1319 Other dysphagia: Secondary | ICD-10-CM | POA: Diagnosis not present

## 2021-07-29 DIAGNOSIS — Z930 Tracheostomy status: Secondary | ICD-10-CM | POA: Diagnosis not present

## 2021-07-29 DIAGNOSIS — E063 Autoimmune thyroiditis: Secondary | ICD-10-CM | POA: Diagnosis not present

## 2021-07-29 DIAGNOSIS — R569 Unspecified convulsions: Secondary | ICD-10-CM | POA: Diagnosis not present

## 2021-07-29 DIAGNOSIS — J9600 Acute respiratory failure, unspecified whether with hypoxia or hypercapnia: Secondary | ICD-10-CM | POA: Diagnosis not present

## 2021-07-29 DIAGNOSIS — N390 Urinary tract infection, site not specified: Secondary | ICD-10-CM | POA: Diagnosis not present

## 2021-07-29 DIAGNOSIS — G0481 Other encephalitis and encephalomyelitis: Secondary | ICD-10-CM | POA: Diagnosis not present

## 2021-07-29 DIAGNOSIS — J151 Pneumonia due to Pseudomonas: Secondary | ICD-10-CM | POA: Diagnosis not present

## 2021-07-29 DIAGNOSIS — G909 Disorder of the autonomic nervous system, unspecified: Secondary | ICD-10-CM | POA: Diagnosis not present

## 2021-07-29 DIAGNOSIS — B9620 Unspecified Escherichia coli [E. coli] as the cause of diseases classified elsewhere: Secondary | ICD-10-CM | POA: Diagnosis not present

## 2021-07-29 DIAGNOSIS — E039 Hypothyroidism, unspecified: Secondary | ICD-10-CM | POA: Diagnosis not present

## 2021-07-29 DIAGNOSIS — Y95 Nosocomial condition: Secondary | ICD-10-CM | POA: Diagnosis not present

## 2021-07-30 DIAGNOSIS — G909 Disorder of the autonomic nervous system, unspecified: Secondary | ICD-10-CM | POA: Diagnosis not present

## 2021-07-30 DIAGNOSIS — Z930 Tracheostomy status: Secondary | ICD-10-CM | POA: Diagnosis not present

## 2021-07-30 DIAGNOSIS — R1319 Other dysphagia: Secondary | ICD-10-CM | POA: Diagnosis not present

## 2021-07-30 DIAGNOSIS — N390 Urinary tract infection, site not specified: Secondary | ICD-10-CM | POA: Diagnosis not present

## 2021-07-30 DIAGNOSIS — J151 Pneumonia due to Pseudomonas: Secondary | ICD-10-CM | POA: Diagnosis not present

## 2021-07-30 DIAGNOSIS — J9600 Acute respiratory failure, unspecified whether with hypoxia or hypercapnia: Secondary | ICD-10-CM | POA: Diagnosis not present

## 2021-07-30 DIAGNOSIS — E063 Autoimmune thyroiditis: Secondary | ICD-10-CM | POA: Diagnosis not present

## 2021-07-30 DIAGNOSIS — B9620 Unspecified Escherichia coli [E. coli] as the cause of diseases classified elsewhere: Secondary | ICD-10-CM | POA: Diagnosis not present

## 2021-07-30 DIAGNOSIS — G0481 Other encephalitis and encephalomyelitis: Secondary | ICD-10-CM | POA: Diagnosis not present

## 2021-07-30 DIAGNOSIS — E039 Hypothyroidism, unspecified: Secondary | ICD-10-CM | POA: Diagnosis not present

## 2021-07-30 DIAGNOSIS — R569 Unspecified convulsions: Secondary | ICD-10-CM | POA: Diagnosis not present

## 2021-07-30 DIAGNOSIS — Y95 Nosocomial condition: Secondary | ICD-10-CM | POA: Diagnosis not present

## 2021-07-31 DIAGNOSIS — J9600 Acute respiratory failure, unspecified whether with hypoxia or hypercapnia: Secondary | ICD-10-CM | POA: Diagnosis not present

## 2021-07-31 DIAGNOSIS — G909 Disorder of the autonomic nervous system, unspecified: Secondary | ICD-10-CM | POA: Diagnosis not present

## 2021-07-31 DIAGNOSIS — E039 Hypothyroidism, unspecified: Secondary | ICD-10-CM | POA: Diagnosis not present

## 2021-07-31 DIAGNOSIS — N390 Urinary tract infection, site not specified: Secondary | ICD-10-CM | POA: Diagnosis not present

## 2021-07-31 DIAGNOSIS — Z930 Tracheostomy status: Secondary | ICD-10-CM | POA: Diagnosis not present

## 2021-07-31 DIAGNOSIS — G0481 Other encephalitis and encephalomyelitis: Secondary | ICD-10-CM | POA: Diagnosis not present

## 2021-07-31 DIAGNOSIS — R569 Unspecified convulsions: Secondary | ICD-10-CM | POA: Diagnosis not present

## 2021-07-31 DIAGNOSIS — R1319 Other dysphagia: Secondary | ICD-10-CM | POA: Diagnosis not present

## 2021-07-31 DIAGNOSIS — E063 Autoimmune thyroiditis: Secondary | ICD-10-CM | POA: Diagnosis not present

## 2021-07-31 DIAGNOSIS — B9620 Unspecified Escherichia coli [E. coli] as the cause of diseases classified elsewhere: Secondary | ICD-10-CM | POA: Diagnosis not present

## 2021-07-31 DIAGNOSIS — J151 Pneumonia due to Pseudomonas: Secondary | ICD-10-CM | POA: Diagnosis not present

## 2021-07-31 DIAGNOSIS — Y95 Nosocomial condition: Secondary | ICD-10-CM | POA: Diagnosis not present

## 2021-08-01 DIAGNOSIS — R1319 Other dysphagia: Secondary | ICD-10-CM | POA: Diagnosis not present

## 2021-08-01 DIAGNOSIS — G909 Disorder of the autonomic nervous system, unspecified: Secondary | ICD-10-CM | POA: Diagnosis not present

## 2021-08-01 DIAGNOSIS — G0481 Other encephalitis and encephalomyelitis: Secondary | ICD-10-CM | POA: Diagnosis not present

## 2021-08-01 DIAGNOSIS — E039 Hypothyroidism, unspecified: Secondary | ICD-10-CM | POA: Diagnosis not present

## 2021-08-01 DIAGNOSIS — E063 Autoimmune thyroiditis: Secondary | ICD-10-CM | POA: Diagnosis not present

## 2021-08-01 DIAGNOSIS — B9620 Unspecified Escherichia coli [E. coli] as the cause of diseases classified elsewhere: Secondary | ICD-10-CM | POA: Diagnosis not present

## 2021-08-01 DIAGNOSIS — N390 Urinary tract infection, site not specified: Secondary | ICD-10-CM | POA: Diagnosis not present

## 2021-08-01 DIAGNOSIS — J9600 Acute respiratory failure, unspecified whether with hypoxia or hypercapnia: Secondary | ICD-10-CM | POA: Diagnosis not present

## 2021-08-01 DIAGNOSIS — J151 Pneumonia due to Pseudomonas: Secondary | ICD-10-CM | POA: Diagnosis not present

## 2021-08-01 DIAGNOSIS — Y95 Nosocomial condition: Secondary | ICD-10-CM | POA: Diagnosis not present

## 2021-08-01 DIAGNOSIS — R569 Unspecified convulsions: Secondary | ICD-10-CM | POA: Diagnosis not present

## 2021-08-01 DIAGNOSIS — Z930 Tracheostomy status: Secondary | ICD-10-CM | POA: Diagnosis not present

## 2021-08-02 DIAGNOSIS — B9620 Unspecified Escherichia coli [E. coli] as the cause of diseases classified elsewhere: Secondary | ICD-10-CM | POA: Diagnosis not present

## 2021-08-02 DIAGNOSIS — Y95 Nosocomial condition: Secondary | ICD-10-CM | POA: Diagnosis not present

## 2021-08-02 DIAGNOSIS — G909 Disorder of the autonomic nervous system, unspecified: Secondary | ICD-10-CM | POA: Diagnosis not present

## 2021-08-02 DIAGNOSIS — R1319 Other dysphagia: Secondary | ICD-10-CM | POA: Diagnosis not present

## 2021-08-02 DIAGNOSIS — E063 Autoimmune thyroiditis: Secondary | ICD-10-CM | POA: Diagnosis not present

## 2021-08-02 DIAGNOSIS — N390 Urinary tract infection, site not specified: Secondary | ICD-10-CM | POA: Diagnosis not present

## 2021-08-02 DIAGNOSIS — J9600 Acute respiratory failure, unspecified whether with hypoxia or hypercapnia: Secondary | ICD-10-CM | POA: Diagnosis not present

## 2021-08-02 DIAGNOSIS — E039 Hypothyroidism, unspecified: Secondary | ICD-10-CM | POA: Diagnosis not present

## 2021-08-02 DIAGNOSIS — R569 Unspecified convulsions: Secondary | ICD-10-CM | POA: Diagnosis not present

## 2021-08-02 DIAGNOSIS — Z930 Tracheostomy status: Secondary | ICD-10-CM | POA: Diagnosis not present

## 2021-08-02 DIAGNOSIS — J151 Pneumonia due to Pseudomonas: Secondary | ICD-10-CM | POA: Diagnosis not present

## 2021-08-02 DIAGNOSIS — G0481 Other encephalitis and encephalomyelitis: Secondary | ICD-10-CM | POA: Diagnosis not present

## 2021-08-03 DIAGNOSIS — R000 Tachycardia, unspecified: Secondary | ICD-10-CM | POA: Diagnosis not present

## 2021-08-03 DIAGNOSIS — G0481 Other encephalitis and encephalomyelitis: Secondary | ICD-10-CM | POA: Diagnosis not present

## 2021-08-03 DIAGNOSIS — G9349 Other encephalopathy: Secondary | ICD-10-CM | POA: Diagnosis not present

## 2021-08-03 DIAGNOSIS — G909 Disorder of the autonomic nervous system, unspecified: Secondary | ICD-10-CM | POA: Diagnosis not present

## 2021-08-03 DIAGNOSIS — R569 Unspecified convulsions: Secondary | ICD-10-CM | POA: Diagnosis not present

## 2021-08-03 DIAGNOSIS — J9600 Acute respiratory failure, unspecified whether with hypoxia or hypercapnia: Secondary | ICD-10-CM | POA: Diagnosis not present

## 2021-08-03 DIAGNOSIS — E039 Hypothyroidism, unspecified: Secondary | ICD-10-CM | POA: Diagnosis not present

## 2021-08-03 DIAGNOSIS — R1319 Other dysphagia: Secondary | ICD-10-CM | POA: Diagnosis not present

## 2021-08-03 DIAGNOSIS — B181 Chronic viral hepatitis B without delta-agent: Secondary | ICD-10-CM | POA: Diagnosis not present

## 2021-08-03 DIAGNOSIS — Z930 Tracheostomy status: Secondary | ICD-10-CM | POA: Diagnosis not present

## 2021-08-03 DIAGNOSIS — E063 Autoimmune thyroiditis: Secondary | ICD-10-CM | POA: Diagnosis not present

## 2021-08-04 DIAGNOSIS — G909 Disorder of the autonomic nervous system, unspecified: Secondary | ICD-10-CM | POA: Diagnosis not present

## 2021-08-04 DIAGNOSIS — J9600 Acute respiratory failure, unspecified whether with hypoxia or hypercapnia: Secondary | ICD-10-CM | POA: Diagnosis not present

## 2021-08-04 DIAGNOSIS — E039 Hypothyroidism, unspecified: Secondary | ICD-10-CM | POA: Diagnosis not present

## 2021-08-04 DIAGNOSIS — Z930 Tracheostomy status: Secondary | ICD-10-CM | POA: Diagnosis not present

## 2021-08-04 DIAGNOSIS — R1319 Other dysphagia: Secondary | ICD-10-CM | POA: Diagnosis not present

## 2021-08-04 DIAGNOSIS — E063 Autoimmune thyroiditis: Secondary | ICD-10-CM | POA: Diagnosis not present

## 2021-08-04 DIAGNOSIS — G0481 Other encephalitis and encephalomyelitis: Secondary | ICD-10-CM | POA: Diagnosis not present

## 2021-08-04 DIAGNOSIS — R569 Unspecified convulsions: Secondary | ICD-10-CM | POA: Diagnosis not present

## 2021-08-04 DIAGNOSIS — G9349 Other encephalopathy: Secondary | ICD-10-CM | POA: Diagnosis not present

## 2021-08-04 DIAGNOSIS — B181 Chronic viral hepatitis B without delta-agent: Secondary | ICD-10-CM | POA: Diagnosis not present

## 2021-08-05 DIAGNOSIS — Z930 Tracheostomy status: Secondary | ICD-10-CM | POA: Diagnosis not present

## 2021-08-05 DIAGNOSIS — E039 Hypothyroidism, unspecified: Secondary | ICD-10-CM | POA: Diagnosis not present

## 2021-08-05 DIAGNOSIS — G909 Disorder of the autonomic nervous system, unspecified: Secondary | ICD-10-CM | POA: Diagnosis not present

## 2021-08-05 DIAGNOSIS — R569 Unspecified convulsions: Secondary | ICD-10-CM | POA: Diagnosis not present

## 2021-08-05 DIAGNOSIS — R1319 Other dysphagia: Secondary | ICD-10-CM | POA: Diagnosis not present

## 2021-08-05 DIAGNOSIS — J9600 Acute respiratory failure, unspecified whether with hypoxia or hypercapnia: Secondary | ICD-10-CM | POA: Diagnosis not present

## 2021-08-05 DIAGNOSIS — E063 Autoimmune thyroiditis: Secondary | ICD-10-CM | POA: Diagnosis not present

## 2021-08-05 DIAGNOSIS — G9349 Other encephalopathy: Secondary | ICD-10-CM | POA: Diagnosis not present

## 2021-08-05 DIAGNOSIS — G0481 Other encephalitis and encephalomyelitis: Secondary | ICD-10-CM | POA: Diagnosis not present

## 2021-08-05 DIAGNOSIS — B181 Chronic viral hepatitis B without delta-agent: Secondary | ICD-10-CM | POA: Diagnosis not present

## 2021-08-06 DIAGNOSIS — B181 Chronic viral hepatitis B without delta-agent: Secondary | ICD-10-CM | POA: Diagnosis not present

## 2021-08-06 DIAGNOSIS — E039 Hypothyroidism, unspecified: Secondary | ICD-10-CM | POA: Diagnosis not present

## 2021-08-06 DIAGNOSIS — J9600 Acute respiratory failure, unspecified whether with hypoxia or hypercapnia: Secondary | ICD-10-CM | POA: Diagnosis not present

## 2021-08-06 DIAGNOSIS — R404 Transient alteration of awareness: Secondary | ICD-10-CM | POA: Diagnosis not present

## 2021-08-06 DIAGNOSIS — R569 Unspecified convulsions: Secondary | ICD-10-CM | POA: Diagnosis not present

## 2021-08-06 DIAGNOSIS — G909 Disorder of the autonomic nervous system, unspecified: Secondary | ICD-10-CM | POA: Diagnosis not present

## 2021-08-06 DIAGNOSIS — G0481 Other encephalitis and encephalomyelitis: Secondary | ICD-10-CM | POA: Diagnosis not present

## 2021-08-06 DIAGNOSIS — E063 Autoimmune thyroiditis: Secondary | ICD-10-CM | POA: Diagnosis not present

## 2021-08-06 DIAGNOSIS — R1319 Other dysphagia: Secondary | ICD-10-CM | POA: Diagnosis not present

## 2021-08-06 DIAGNOSIS — Z930 Tracheostomy status: Secondary | ICD-10-CM | POA: Diagnosis not present

## 2021-08-07 DIAGNOSIS — Z930 Tracheostomy status: Secondary | ICD-10-CM | POA: Diagnosis not present

## 2021-08-07 DIAGNOSIS — B181 Chronic viral hepatitis B without delta-agent: Secondary | ICD-10-CM | POA: Diagnosis not present

## 2021-08-07 DIAGNOSIS — E039 Hypothyroidism, unspecified: Secondary | ICD-10-CM | POA: Diagnosis not present

## 2021-08-07 DIAGNOSIS — G0490 Encephalitis and encephalomyelitis, unspecified: Secondary | ICD-10-CM | POA: Diagnosis not present

## 2021-08-07 DIAGNOSIS — E063 Autoimmune thyroiditis: Secondary | ICD-10-CM | POA: Diagnosis not present

## 2021-08-07 DIAGNOSIS — R569 Unspecified convulsions: Secondary | ICD-10-CM | POA: Diagnosis not present

## 2021-08-07 DIAGNOSIS — Z95828 Presence of other vascular implants and grafts: Secondary | ICD-10-CM | POA: Diagnosis not present

## 2021-08-07 DIAGNOSIS — G0481 Other encephalitis and encephalomyelitis: Secondary | ICD-10-CM | POA: Diagnosis not present

## 2021-08-07 DIAGNOSIS — J9600 Acute respiratory failure, unspecified whether with hypoxia or hypercapnia: Secondary | ICD-10-CM | POA: Diagnosis not present

## 2021-08-07 DIAGNOSIS — R1319 Other dysphagia: Secondary | ICD-10-CM | POA: Diagnosis not present

## 2021-08-08 DIAGNOSIS — R1319 Other dysphagia: Secondary | ICD-10-CM | POA: Diagnosis not present

## 2021-08-08 DIAGNOSIS — G0481 Other encephalitis and encephalomyelitis: Secondary | ICD-10-CM | POA: Diagnosis not present

## 2021-08-08 DIAGNOSIS — E063 Autoimmune thyroiditis: Secondary | ICD-10-CM | POA: Diagnosis not present

## 2021-08-08 DIAGNOSIS — G0490 Encephalitis and encephalomyelitis, unspecified: Secondary | ICD-10-CM | POA: Diagnosis not present

## 2021-08-08 DIAGNOSIS — Z930 Tracheostomy status: Secondary | ICD-10-CM | POA: Diagnosis not present

## 2021-08-08 DIAGNOSIS — E039 Hypothyroidism, unspecified: Secondary | ICD-10-CM | POA: Diagnosis not present

## 2021-08-08 DIAGNOSIS — Z95828 Presence of other vascular implants and grafts: Secondary | ICD-10-CM | POA: Diagnosis not present

## 2021-08-08 DIAGNOSIS — R569 Unspecified convulsions: Secondary | ICD-10-CM | POA: Diagnosis not present

## 2021-08-08 DIAGNOSIS — J9600 Acute respiratory failure, unspecified whether with hypoxia or hypercapnia: Secondary | ICD-10-CM | POA: Diagnosis not present

## 2021-08-08 DIAGNOSIS — B181 Chronic viral hepatitis B without delta-agent: Secondary | ICD-10-CM | POA: Diagnosis not present

## 2021-08-09 DIAGNOSIS — E039 Hypothyroidism, unspecified: Secondary | ICD-10-CM | POA: Diagnosis not present

## 2021-08-09 DIAGNOSIS — Z95828 Presence of other vascular implants and grafts: Secondary | ICD-10-CM | POA: Diagnosis not present

## 2021-08-09 DIAGNOSIS — J9600 Acute respiratory failure, unspecified whether with hypoxia or hypercapnia: Secondary | ICD-10-CM | POA: Diagnosis not present

## 2021-08-09 DIAGNOSIS — R1319 Other dysphagia: Secondary | ICD-10-CM | POA: Diagnosis not present

## 2021-08-09 DIAGNOSIS — Z930 Tracheostomy status: Secondary | ICD-10-CM | POA: Diagnosis not present

## 2021-08-09 DIAGNOSIS — G0490 Encephalitis and encephalomyelitis, unspecified: Secondary | ICD-10-CM | POA: Diagnosis not present

## 2021-08-09 DIAGNOSIS — E063 Autoimmune thyroiditis: Secondary | ICD-10-CM | POA: Diagnosis not present

## 2021-08-09 DIAGNOSIS — B181 Chronic viral hepatitis B without delta-agent: Secondary | ICD-10-CM | POA: Diagnosis not present

## 2021-08-09 DIAGNOSIS — R569 Unspecified convulsions: Secondary | ICD-10-CM | POA: Diagnosis not present

## 2021-08-09 DIAGNOSIS — G0481 Other encephalitis and encephalomyelitis: Secondary | ICD-10-CM | POA: Diagnosis not present

## 2021-08-10 DIAGNOSIS — E063 Autoimmune thyroiditis: Secondary | ICD-10-CM | POA: Diagnosis not present

## 2021-08-10 DIAGNOSIS — Z930 Tracheostomy status: Secondary | ICD-10-CM | POA: Diagnosis not present

## 2021-08-10 DIAGNOSIS — B181 Chronic viral hepatitis B without delta-agent: Secondary | ICD-10-CM | POA: Diagnosis not present

## 2021-08-10 DIAGNOSIS — J9600 Acute respiratory failure, unspecified whether with hypoxia or hypercapnia: Secondary | ICD-10-CM | POA: Diagnosis not present

## 2021-08-10 DIAGNOSIS — G0481 Other encephalitis and encephalomyelitis: Secondary | ICD-10-CM | POA: Diagnosis not present

## 2021-08-10 DIAGNOSIS — G909 Disorder of the autonomic nervous system, unspecified: Secondary | ICD-10-CM | POA: Diagnosis not present

## 2021-08-10 DIAGNOSIS — R1319 Other dysphagia: Secondary | ICD-10-CM | POA: Diagnosis not present

## 2021-08-10 DIAGNOSIS — E039 Hypothyroidism, unspecified: Secondary | ICD-10-CM | POA: Diagnosis not present

## 2021-08-10 DIAGNOSIS — R569 Unspecified convulsions: Secondary | ICD-10-CM | POA: Diagnosis not present

## 2021-08-11 DIAGNOSIS — J9600 Acute respiratory failure, unspecified whether with hypoxia or hypercapnia: Secondary | ICD-10-CM | POA: Diagnosis not present

## 2021-08-11 DIAGNOSIS — G0481 Other encephalitis and encephalomyelitis: Secondary | ICD-10-CM | POA: Diagnosis not present

## 2021-08-11 DIAGNOSIS — E039 Hypothyroidism, unspecified: Secondary | ICD-10-CM | POA: Diagnosis not present

## 2021-08-11 DIAGNOSIS — E063 Autoimmune thyroiditis: Secondary | ICD-10-CM | POA: Diagnosis not present

## 2021-08-11 DIAGNOSIS — Z930 Tracheostomy status: Secondary | ICD-10-CM | POA: Diagnosis not present

## 2021-08-11 DIAGNOSIS — R1319 Other dysphagia: Secondary | ICD-10-CM | POA: Diagnosis not present

## 2021-08-11 DIAGNOSIS — B181 Chronic viral hepatitis B without delta-agent: Secondary | ICD-10-CM | POA: Diagnosis not present

## 2021-08-11 DIAGNOSIS — Z9981 Dependence on supplemental oxygen: Secondary | ICD-10-CM | POA: Diagnosis not present

## 2021-08-11 DIAGNOSIS — R569 Unspecified convulsions: Secondary | ICD-10-CM | POA: Diagnosis not present

## 2021-08-11 DIAGNOSIS — G909 Disorder of the autonomic nervous system, unspecified: Secondary | ICD-10-CM | POA: Diagnosis not present

## 2021-08-12 DIAGNOSIS — J9600 Acute respiratory failure, unspecified whether with hypoxia or hypercapnia: Secondary | ICD-10-CM | POA: Diagnosis not present

## 2021-08-12 DIAGNOSIS — R1319 Other dysphagia: Secondary | ICD-10-CM | POA: Diagnosis not present

## 2021-08-12 DIAGNOSIS — B181 Chronic viral hepatitis B without delta-agent: Secondary | ICD-10-CM | POA: Diagnosis not present

## 2021-08-12 DIAGNOSIS — R569 Unspecified convulsions: Secondary | ICD-10-CM | POA: Diagnosis not present

## 2021-08-12 DIAGNOSIS — E039 Hypothyroidism, unspecified: Secondary | ICD-10-CM | POA: Diagnosis not present

## 2021-08-12 DIAGNOSIS — Z930 Tracheostomy status: Secondary | ICD-10-CM | POA: Diagnosis not present

## 2021-08-12 DIAGNOSIS — G0481 Other encephalitis and encephalomyelitis: Secondary | ICD-10-CM | POA: Diagnosis not present

## 2021-08-12 DIAGNOSIS — E063 Autoimmune thyroiditis: Secondary | ICD-10-CM | POA: Diagnosis not present

## 2021-08-12 DIAGNOSIS — G909 Disorder of the autonomic nervous system, unspecified: Secondary | ICD-10-CM | POA: Diagnosis not present

## 2021-08-12 DIAGNOSIS — Z9981 Dependence on supplemental oxygen: Secondary | ICD-10-CM | POA: Diagnosis not present

## 2021-08-13 DIAGNOSIS — E039 Hypothyroidism, unspecified: Secondary | ICD-10-CM | POA: Diagnosis not present

## 2021-08-13 DIAGNOSIS — J9600 Acute respiratory failure, unspecified whether with hypoxia or hypercapnia: Secondary | ICD-10-CM | POA: Diagnosis not present

## 2021-08-13 DIAGNOSIS — G909 Disorder of the autonomic nervous system, unspecified: Secondary | ICD-10-CM | POA: Diagnosis not present

## 2021-08-13 DIAGNOSIS — E063 Autoimmune thyroiditis: Secondary | ICD-10-CM | POA: Diagnosis not present

## 2021-08-13 DIAGNOSIS — R1319 Other dysphagia: Secondary | ICD-10-CM | POA: Diagnosis not present

## 2021-08-13 DIAGNOSIS — Z930 Tracheostomy status: Secondary | ICD-10-CM | POA: Diagnosis not present

## 2021-08-13 DIAGNOSIS — Z9981 Dependence on supplemental oxygen: Secondary | ICD-10-CM | POA: Diagnosis not present

## 2021-08-13 DIAGNOSIS — G0481 Other encephalitis and encephalomyelitis: Secondary | ICD-10-CM | POA: Diagnosis not present

## 2021-08-13 DIAGNOSIS — R569 Unspecified convulsions: Secondary | ICD-10-CM | POA: Diagnosis not present

## 2021-08-13 DIAGNOSIS — B181 Chronic viral hepatitis B without delta-agent: Secondary | ICD-10-CM | POA: Diagnosis not present

## 2021-08-14 DIAGNOSIS — Z930 Tracheostomy status: Secondary | ICD-10-CM | POA: Diagnosis not present

## 2021-08-14 DIAGNOSIS — J9600 Acute respiratory failure, unspecified whether with hypoxia or hypercapnia: Secondary | ICD-10-CM | POA: Diagnosis not present

## 2021-08-14 DIAGNOSIS — E039 Hypothyroidism, unspecified: Secondary | ICD-10-CM | POA: Diagnosis not present

## 2021-08-14 DIAGNOSIS — E063 Autoimmune thyroiditis: Secondary | ICD-10-CM | POA: Diagnosis not present

## 2021-08-14 DIAGNOSIS — R569 Unspecified convulsions: Secondary | ICD-10-CM | POA: Diagnosis not present

## 2021-08-14 DIAGNOSIS — G0481 Other encephalitis and encephalomyelitis: Secondary | ICD-10-CM | POA: Diagnosis not present

## 2021-08-14 DIAGNOSIS — G909 Disorder of the autonomic nervous system, unspecified: Secondary | ICD-10-CM | POA: Diagnosis not present

## 2021-08-14 DIAGNOSIS — R1319 Other dysphagia: Secondary | ICD-10-CM | POA: Diagnosis not present

## 2021-08-14 DIAGNOSIS — Z9981 Dependence on supplemental oxygen: Secondary | ICD-10-CM | POA: Diagnosis not present

## 2021-08-14 DIAGNOSIS — B181 Chronic viral hepatitis B without delta-agent: Secondary | ICD-10-CM | POA: Diagnosis not present

## 2021-08-15 DIAGNOSIS — G0481 Other encephalitis and encephalomyelitis: Secondary | ICD-10-CM | POA: Diagnosis not present

## 2021-08-15 DIAGNOSIS — E063 Autoimmune thyroiditis: Secondary | ICD-10-CM | POA: Diagnosis not present

## 2021-08-15 DIAGNOSIS — B181 Chronic viral hepatitis B without delta-agent: Secondary | ICD-10-CM | POA: Diagnosis not present

## 2021-08-15 DIAGNOSIS — G909 Disorder of the autonomic nervous system, unspecified: Secondary | ICD-10-CM | POA: Diagnosis not present

## 2021-08-15 DIAGNOSIS — R1319 Other dysphagia: Secondary | ICD-10-CM | POA: Diagnosis not present

## 2021-08-15 DIAGNOSIS — R569 Unspecified convulsions: Secondary | ICD-10-CM | POA: Diagnosis not present

## 2021-08-15 DIAGNOSIS — E039 Hypothyroidism, unspecified: Secondary | ICD-10-CM | POA: Diagnosis not present

## 2021-08-15 DIAGNOSIS — Z930 Tracheostomy status: Secondary | ICD-10-CM | POA: Diagnosis not present

## 2021-08-15 DIAGNOSIS — J9600 Acute respiratory failure, unspecified whether with hypoxia or hypercapnia: Secondary | ICD-10-CM | POA: Diagnosis not present

## 2021-08-15 DIAGNOSIS — Z9981 Dependence on supplemental oxygen: Secondary | ICD-10-CM | POA: Diagnosis not present

## 2021-08-16 DIAGNOSIS — R569 Unspecified convulsions: Secondary | ICD-10-CM | POA: Diagnosis not present

## 2021-08-16 DIAGNOSIS — E063 Autoimmune thyroiditis: Secondary | ICD-10-CM | POA: Diagnosis not present

## 2021-08-16 DIAGNOSIS — R1319 Other dysphagia: Secondary | ICD-10-CM | POA: Diagnosis not present

## 2021-08-16 DIAGNOSIS — G909 Disorder of the autonomic nervous system, unspecified: Secondary | ICD-10-CM | POA: Diagnosis not present

## 2021-08-16 DIAGNOSIS — B181 Chronic viral hepatitis B without delta-agent: Secondary | ICD-10-CM | POA: Diagnosis not present

## 2021-08-16 DIAGNOSIS — E039 Hypothyroidism, unspecified: Secondary | ICD-10-CM | POA: Diagnosis not present

## 2021-08-16 DIAGNOSIS — Z930 Tracheostomy status: Secondary | ICD-10-CM | POA: Diagnosis not present

## 2021-08-16 DIAGNOSIS — J9600 Acute respiratory failure, unspecified whether with hypoxia or hypercapnia: Secondary | ICD-10-CM | POA: Diagnosis not present

## 2021-08-16 DIAGNOSIS — Z9981 Dependence on supplemental oxygen: Secondary | ICD-10-CM | POA: Diagnosis not present

## 2021-08-16 DIAGNOSIS — G0481 Other encephalitis and encephalomyelitis: Secondary | ICD-10-CM | POA: Diagnosis not present

## 2021-08-17 DIAGNOSIS — R569 Unspecified convulsions: Secondary | ICD-10-CM | POA: Diagnosis not present

## 2021-08-17 DIAGNOSIS — E039 Hypothyroidism, unspecified: Secondary | ICD-10-CM | POA: Diagnosis not present

## 2021-08-17 DIAGNOSIS — Z930 Tracheostomy status: Secondary | ICD-10-CM | POA: Diagnosis not present

## 2021-08-17 DIAGNOSIS — R0602 Shortness of breath: Secondary | ICD-10-CM | POA: Diagnosis not present

## 2021-08-17 DIAGNOSIS — G0481 Other encephalitis and encephalomyelitis: Secondary | ICD-10-CM | POA: Diagnosis not present

## 2021-08-17 DIAGNOSIS — R1319 Other dysphagia: Secondary | ICD-10-CM | POA: Diagnosis not present

## 2021-08-17 DIAGNOSIS — G909 Disorder of the autonomic nervous system, unspecified: Secondary | ICD-10-CM | POA: Diagnosis not present

## 2021-08-17 DIAGNOSIS — J9600 Acute respiratory failure, unspecified whether with hypoxia or hypercapnia: Secondary | ICD-10-CM | POA: Diagnosis not present

## 2021-08-17 DIAGNOSIS — B181 Chronic viral hepatitis B without delta-agent: Secondary | ICD-10-CM | POA: Diagnosis not present

## 2021-08-17 DIAGNOSIS — E063 Autoimmune thyroiditis: Secondary | ICD-10-CM | POA: Diagnosis not present

## 2021-08-18 DIAGNOSIS — R1319 Other dysphagia: Secondary | ICD-10-CM | POA: Diagnosis not present

## 2021-08-18 DIAGNOSIS — E063 Autoimmune thyroiditis: Secondary | ICD-10-CM | POA: Diagnosis not present

## 2021-08-18 DIAGNOSIS — J9600 Acute respiratory failure, unspecified whether with hypoxia or hypercapnia: Secondary | ICD-10-CM | POA: Diagnosis not present

## 2021-08-18 DIAGNOSIS — E039 Hypothyroidism, unspecified: Secondary | ICD-10-CM | POA: Diagnosis not present

## 2021-08-18 DIAGNOSIS — Z930 Tracheostomy status: Secondary | ICD-10-CM | POA: Diagnosis not present

## 2021-08-18 DIAGNOSIS — G0481 Other encephalitis and encephalomyelitis: Secondary | ICD-10-CM | POA: Diagnosis not present

## 2021-08-18 DIAGNOSIS — G909 Disorder of the autonomic nervous system, unspecified: Secondary | ICD-10-CM | POA: Diagnosis not present

## 2021-08-18 DIAGNOSIS — B181 Chronic viral hepatitis B without delta-agent: Secondary | ICD-10-CM | POA: Diagnosis not present

## 2021-08-18 DIAGNOSIS — R569 Unspecified convulsions: Secondary | ICD-10-CM | POA: Diagnosis not present

## 2021-08-19 DIAGNOSIS — R1319 Other dysphagia: Secondary | ICD-10-CM | POA: Diagnosis not present

## 2021-08-19 DIAGNOSIS — E063 Autoimmune thyroiditis: Secondary | ICD-10-CM | POA: Diagnosis not present

## 2021-08-19 DIAGNOSIS — G0481 Other encephalitis and encephalomyelitis: Secondary | ICD-10-CM | POA: Diagnosis not present

## 2021-08-19 DIAGNOSIS — R569 Unspecified convulsions: Secondary | ICD-10-CM | POA: Diagnosis not present

## 2021-08-19 DIAGNOSIS — G909 Disorder of the autonomic nervous system, unspecified: Secondary | ICD-10-CM | POA: Diagnosis not present

## 2021-08-19 DIAGNOSIS — J9600 Acute respiratory failure, unspecified whether with hypoxia or hypercapnia: Secondary | ICD-10-CM | POA: Diagnosis not present

## 2021-08-19 DIAGNOSIS — E039 Hypothyroidism, unspecified: Secondary | ICD-10-CM | POA: Diagnosis not present

## 2021-08-19 DIAGNOSIS — B181 Chronic viral hepatitis B without delta-agent: Secondary | ICD-10-CM | POA: Diagnosis not present

## 2021-08-19 DIAGNOSIS — Z930 Tracheostomy status: Secondary | ICD-10-CM | POA: Diagnosis not present

## 2021-08-20 DIAGNOSIS — Z930 Tracheostomy status: Secondary | ICD-10-CM | POA: Diagnosis not present

## 2021-08-20 DIAGNOSIS — G909 Disorder of the autonomic nervous system, unspecified: Secondary | ICD-10-CM | POA: Diagnosis not present

## 2021-08-20 DIAGNOSIS — J9600 Acute respiratory failure, unspecified whether with hypoxia or hypercapnia: Secondary | ICD-10-CM | POA: Diagnosis not present

## 2021-08-20 DIAGNOSIS — E039 Hypothyroidism, unspecified: Secondary | ICD-10-CM | POA: Diagnosis not present

## 2021-08-20 DIAGNOSIS — B181 Chronic viral hepatitis B without delta-agent: Secondary | ICD-10-CM | POA: Diagnosis not present

## 2021-08-20 DIAGNOSIS — R569 Unspecified convulsions: Secondary | ICD-10-CM | POA: Diagnosis not present

## 2021-08-20 DIAGNOSIS — R1319 Other dysphagia: Secondary | ICD-10-CM | POA: Diagnosis not present

## 2021-08-20 DIAGNOSIS — E063 Autoimmune thyroiditis: Secondary | ICD-10-CM | POA: Diagnosis not present

## 2021-08-20 DIAGNOSIS — G9349 Other encephalopathy: Secondary | ICD-10-CM | POA: Diagnosis not present

## 2021-08-20 DIAGNOSIS — G0481 Other encephalitis and encephalomyelitis: Secondary | ICD-10-CM | POA: Diagnosis not present

## 2021-08-21 DIAGNOSIS — E039 Hypothyroidism, unspecified: Secondary | ICD-10-CM | POA: Diagnosis not present

## 2021-08-21 DIAGNOSIS — J9600 Acute respiratory failure, unspecified whether with hypoxia or hypercapnia: Secondary | ICD-10-CM | POA: Diagnosis not present

## 2021-08-21 DIAGNOSIS — G0481 Other encephalitis and encephalomyelitis: Secondary | ICD-10-CM | POA: Diagnosis not present

## 2021-08-21 DIAGNOSIS — G909 Disorder of the autonomic nervous system, unspecified: Secondary | ICD-10-CM | POA: Diagnosis not present

## 2021-08-21 DIAGNOSIS — R569 Unspecified convulsions: Secondary | ICD-10-CM | POA: Diagnosis not present

## 2021-08-21 DIAGNOSIS — Z930 Tracheostomy status: Secondary | ICD-10-CM | POA: Diagnosis not present

## 2021-08-21 DIAGNOSIS — R1319 Other dysphagia: Secondary | ICD-10-CM | POA: Diagnosis not present

## 2021-08-21 DIAGNOSIS — G9349 Other encephalopathy: Secondary | ICD-10-CM | POA: Diagnosis not present

## 2021-08-21 DIAGNOSIS — B181 Chronic viral hepatitis B without delta-agent: Secondary | ICD-10-CM | POA: Diagnosis not present

## 2021-08-21 DIAGNOSIS — E063 Autoimmune thyroiditis: Secondary | ICD-10-CM | POA: Diagnosis not present

## 2021-08-22 DIAGNOSIS — R569 Unspecified convulsions: Secondary | ICD-10-CM | POA: Diagnosis not present

## 2021-08-22 DIAGNOSIS — R1319 Other dysphagia: Secondary | ICD-10-CM | POA: Diagnosis not present

## 2021-08-22 DIAGNOSIS — N643 Galactorrhea not associated with childbirth: Secondary | ICD-10-CM | POA: Diagnosis not present

## 2021-08-22 DIAGNOSIS — G0481 Other encephalitis and encephalomyelitis: Secondary | ICD-10-CM | POA: Diagnosis not present

## 2021-08-22 DIAGNOSIS — E039 Hypothyroidism, unspecified: Secondary | ICD-10-CM | POA: Diagnosis not present

## 2021-08-22 DIAGNOSIS — J9600 Acute respiratory failure, unspecified whether with hypoxia or hypercapnia: Secondary | ICD-10-CM | POA: Diagnosis not present

## 2021-08-22 DIAGNOSIS — E063 Autoimmune thyroiditis: Secondary | ICD-10-CM | POA: Diagnosis not present

## 2021-08-22 DIAGNOSIS — Z930 Tracheostomy status: Secondary | ICD-10-CM | POA: Diagnosis not present

## 2021-08-22 DIAGNOSIS — B181 Chronic viral hepatitis B without delta-agent: Secondary | ICD-10-CM | POA: Diagnosis not present

## 2021-08-22 DIAGNOSIS — G9349 Other encephalopathy: Secondary | ICD-10-CM | POA: Diagnosis not present

## 2021-08-22 DIAGNOSIS — G909 Disorder of the autonomic nervous system, unspecified: Secondary | ICD-10-CM | POA: Diagnosis not present

## 2021-08-23 DIAGNOSIS — B181 Chronic viral hepatitis B without delta-agent: Secondary | ICD-10-CM | POA: Diagnosis not present

## 2021-08-23 DIAGNOSIS — J9600 Acute respiratory failure, unspecified whether with hypoxia or hypercapnia: Secondary | ICD-10-CM | POA: Diagnosis not present

## 2021-08-23 DIAGNOSIS — G9349 Other encephalopathy: Secondary | ICD-10-CM | POA: Diagnosis not present

## 2021-08-23 DIAGNOSIS — N643 Galactorrhea not associated with childbirth: Secondary | ICD-10-CM | POA: Diagnosis not present

## 2021-08-23 DIAGNOSIS — Z930 Tracheostomy status: Secondary | ICD-10-CM | POA: Diagnosis not present

## 2021-08-23 DIAGNOSIS — R569 Unspecified convulsions: Secondary | ICD-10-CM | POA: Diagnosis not present

## 2021-08-23 DIAGNOSIS — G0481 Other encephalitis and encephalomyelitis: Secondary | ICD-10-CM | POA: Diagnosis not present

## 2021-08-23 DIAGNOSIS — G909 Disorder of the autonomic nervous system, unspecified: Secondary | ICD-10-CM | POA: Diagnosis not present

## 2021-08-23 DIAGNOSIS — E039 Hypothyroidism, unspecified: Secondary | ICD-10-CM | POA: Diagnosis not present

## 2021-08-23 DIAGNOSIS — E063 Autoimmune thyroiditis: Secondary | ICD-10-CM | POA: Diagnosis not present

## 2021-08-23 DIAGNOSIS — R1319 Other dysphagia: Secondary | ICD-10-CM | POA: Diagnosis not present

## 2021-08-24 DIAGNOSIS — G0481 Other encephalitis and encephalomyelitis: Secondary | ICD-10-CM | POA: Diagnosis not present

## 2021-08-24 DIAGNOSIS — R569 Unspecified convulsions: Secondary | ICD-10-CM | POA: Diagnosis not present

## 2021-08-24 DIAGNOSIS — G909 Disorder of the autonomic nervous system, unspecified: Secondary | ICD-10-CM | POA: Diagnosis not present

## 2021-08-24 DIAGNOSIS — R1319 Other dysphagia: Secondary | ICD-10-CM | POA: Diagnosis not present

## 2021-08-24 DIAGNOSIS — G9349 Other encephalopathy: Secondary | ICD-10-CM | POA: Diagnosis not present

## 2021-08-24 DIAGNOSIS — J9600 Acute respiratory failure, unspecified whether with hypoxia or hypercapnia: Secondary | ICD-10-CM | POA: Diagnosis not present

## 2021-08-24 DIAGNOSIS — N643 Galactorrhea not associated with childbirth: Secondary | ICD-10-CM | POA: Diagnosis not present

## 2021-08-24 DIAGNOSIS — B181 Chronic viral hepatitis B without delta-agent: Secondary | ICD-10-CM | POA: Diagnosis not present

## 2021-08-25 DIAGNOSIS — N643 Galactorrhea not associated with childbirth: Secondary | ICD-10-CM | POA: Diagnosis not present

## 2021-08-25 DIAGNOSIS — R845 Abnormal microbiological findings in specimens from respiratory organs and thorax: Secondary | ICD-10-CM | POA: Diagnosis not present

## 2021-08-25 DIAGNOSIS — B181 Chronic viral hepatitis B without delta-agent: Secondary | ICD-10-CM | POA: Diagnosis not present

## 2021-08-25 DIAGNOSIS — G0481 Other encephalitis and encephalomyelitis: Secondary | ICD-10-CM | POA: Diagnosis not present

## 2021-08-25 DIAGNOSIS — D72829 Elevated white blood cell count, unspecified: Secondary | ICD-10-CM | POA: Diagnosis not present

## 2021-08-25 DIAGNOSIS — G909 Disorder of the autonomic nervous system, unspecified: Secondary | ICD-10-CM | POA: Diagnosis not present

## 2021-08-25 DIAGNOSIS — B965 Pseudomonas (aeruginosa) (mallei) (pseudomallei) as the cause of diseases classified elsewhere: Secondary | ICD-10-CM | POA: Diagnosis not present

## 2021-08-25 DIAGNOSIS — R509 Fever, unspecified: Secondary | ICD-10-CM | POA: Diagnosis not present

## 2021-08-25 DIAGNOSIS — R000 Tachycardia, unspecified: Secondary | ICD-10-CM | POA: Diagnosis not present

## 2021-08-25 DIAGNOSIS — Z930 Tracheostomy status: Secondary | ICD-10-CM | POA: Diagnosis not present

## 2021-08-25 DIAGNOSIS — R1319 Other dysphagia: Secondary | ICD-10-CM | POA: Diagnosis not present

## 2021-08-25 DIAGNOSIS — E063 Autoimmune thyroiditis: Secondary | ICD-10-CM | POA: Diagnosis not present

## 2021-08-25 DIAGNOSIS — R569 Unspecified convulsions: Secondary | ICD-10-CM | POA: Diagnosis not present

## 2021-08-25 DIAGNOSIS — R5081 Fever presenting with conditions classified elsewhere: Secondary | ICD-10-CM | POA: Diagnosis not present

## 2021-08-25 DIAGNOSIS — B9561 Methicillin susceptible Staphylococcus aureus infection as the cause of diseases classified elsewhere: Secondary | ICD-10-CM | POA: Diagnosis not present

## 2021-08-25 DIAGNOSIS — B9620 Unspecified Escherichia coli [E. coli] as the cause of diseases classified elsewhere: Secondary | ICD-10-CM | POA: Diagnosis not present

## 2021-08-25 DIAGNOSIS — Z931 Gastrostomy status: Secondary | ICD-10-CM | POA: Diagnosis not present

## 2021-08-25 DIAGNOSIS — E039 Hypothyroidism, unspecified: Secondary | ICD-10-CM | POA: Diagnosis not present

## 2021-08-25 DIAGNOSIS — J9600 Acute respiratory failure, unspecified whether with hypoxia or hypercapnia: Secondary | ICD-10-CM | POA: Diagnosis not present

## 2021-08-25 DIAGNOSIS — Z1612 Extended spectrum beta lactamase (ESBL) resistance: Secondary | ICD-10-CM | POA: Diagnosis not present

## 2021-08-25 DIAGNOSIS — R918 Other nonspecific abnormal finding of lung field: Secondary | ICD-10-CM | POA: Diagnosis not present

## 2021-08-26 DIAGNOSIS — N643 Galactorrhea not associated with childbirth: Secondary | ICD-10-CM | POA: Diagnosis not present

## 2021-08-26 DIAGNOSIS — R1319 Other dysphagia: Secondary | ICD-10-CM | POA: Diagnosis not present

## 2021-08-26 DIAGNOSIS — J9600 Acute respiratory failure, unspecified whether with hypoxia or hypercapnia: Secondary | ICD-10-CM | POA: Diagnosis not present

## 2021-08-26 DIAGNOSIS — Z930 Tracheostomy status: Secondary | ICD-10-CM | POA: Diagnosis not present

## 2021-08-26 DIAGNOSIS — R569 Unspecified convulsions: Secondary | ICD-10-CM | POA: Diagnosis not present

## 2021-08-26 DIAGNOSIS — E063 Autoimmune thyroiditis: Secondary | ICD-10-CM | POA: Diagnosis not present

## 2021-08-26 DIAGNOSIS — G0481 Other encephalitis and encephalomyelitis: Secondary | ICD-10-CM | POA: Diagnosis not present

## 2021-08-26 DIAGNOSIS — E039 Hypothyroidism, unspecified: Secondary | ICD-10-CM | POA: Diagnosis not present

## 2021-08-26 DIAGNOSIS — B181 Chronic viral hepatitis B without delta-agent: Secondary | ICD-10-CM | POA: Diagnosis not present

## 2021-08-26 DIAGNOSIS — R4189 Other symptoms and signs involving cognitive functions and awareness: Secondary | ICD-10-CM | POA: Diagnosis not present

## 2021-08-26 DIAGNOSIS — G909 Disorder of the autonomic nervous system, unspecified: Secondary | ICD-10-CM | POA: Diagnosis not present

## 2021-08-27 DIAGNOSIS — Z931 Gastrostomy status: Secondary | ICD-10-CM | POA: Diagnosis not present

## 2021-08-27 DIAGNOSIS — B181 Chronic viral hepatitis B without delta-agent: Secondary | ICD-10-CM | POA: Diagnosis not present

## 2021-08-27 DIAGNOSIS — E063 Autoimmune thyroiditis: Secondary | ICD-10-CM | POA: Diagnosis not present

## 2021-08-27 DIAGNOSIS — R569 Unspecified convulsions: Secondary | ICD-10-CM | POA: Diagnosis not present

## 2021-08-27 DIAGNOSIS — N643 Galactorrhea not associated with childbirth: Secondary | ICD-10-CM | POA: Diagnosis not present

## 2021-08-27 DIAGNOSIS — J9600 Acute respiratory failure, unspecified whether with hypoxia or hypercapnia: Secondary | ICD-10-CM | POA: Diagnosis not present

## 2021-08-27 DIAGNOSIS — G0481 Other encephalitis and encephalomyelitis: Secondary | ICD-10-CM | POA: Diagnosis not present

## 2021-08-27 DIAGNOSIS — E039 Hypothyroidism, unspecified: Secondary | ICD-10-CM | POA: Diagnosis not present

## 2021-08-27 DIAGNOSIS — Z862 Personal history of diseases of the blood and blood-forming organs and certain disorders involving the immune mechanism: Secondary | ICD-10-CM | POA: Diagnosis not present

## 2021-08-27 DIAGNOSIS — Z930 Tracheostomy status: Secondary | ICD-10-CM | POA: Diagnosis not present

## 2021-08-27 DIAGNOSIS — R1319 Other dysphagia: Secondary | ICD-10-CM | POA: Diagnosis not present

## 2021-08-27 DIAGNOSIS — G909 Disorder of the autonomic nervous system, unspecified: Secondary | ICD-10-CM | POA: Diagnosis not present

## 2021-08-28 DIAGNOSIS — N643 Galactorrhea not associated with childbirth: Secondary | ICD-10-CM | POA: Diagnosis not present

## 2021-08-28 DIAGNOSIS — G0481 Other encephalitis and encephalomyelitis: Secondary | ICD-10-CM | POA: Diagnosis not present

## 2021-08-28 DIAGNOSIS — R1319 Other dysphagia: Secondary | ICD-10-CM | POA: Diagnosis not present

## 2021-08-28 DIAGNOSIS — R569 Unspecified convulsions: Secondary | ICD-10-CM | POA: Diagnosis not present

## 2021-08-28 DIAGNOSIS — E039 Hypothyroidism, unspecified: Secondary | ICD-10-CM | POA: Diagnosis not present

## 2021-08-28 DIAGNOSIS — G909 Disorder of the autonomic nervous system, unspecified: Secondary | ICD-10-CM | POA: Diagnosis not present

## 2021-08-28 DIAGNOSIS — Z930 Tracheostomy status: Secondary | ICD-10-CM | POA: Diagnosis not present

## 2021-08-28 DIAGNOSIS — J9600 Acute respiratory failure, unspecified whether with hypoxia or hypercapnia: Secondary | ICD-10-CM | POA: Diagnosis not present

## 2021-08-28 DIAGNOSIS — B181 Chronic viral hepatitis B without delta-agent: Secondary | ICD-10-CM | POA: Diagnosis not present

## 2021-08-28 DIAGNOSIS — E063 Autoimmune thyroiditis: Secondary | ICD-10-CM | POA: Diagnosis not present

## 2021-08-29 DIAGNOSIS — R569 Unspecified convulsions: Secondary | ICD-10-CM | POA: Diagnosis not present

## 2021-08-29 DIAGNOSIS — G909 Disorder of the autonomic nervous system, unspecified: Secondary | ICD-10-CM | POA: Diagnosis not present

## 2021-08-29 DIAGNOSIS — G0481 Other encephalitis and encephalomyelitis: Secondary | ICD-10-CM | POA: Diagnosis not present

## 2021-08-29 DIAGNOSIS — E063 Autoimmune thyroiditis: Secondary | ICD-10-CM | POA: Diagnosis not present

## 2021-08-29 DIAGNOSIS — Z930 Tracheostomy status: Secondary | ICD-10-CM | POA: Diagnosis not present

## 2021-08-29 DIAGNOSIS — N643 Galactorrhea not associated with childbirth: Secondary | ICD-10-CM | POA: Diagnosis not present

## 2021-08-29 DIAGNOSIS — R1319 Other dysphagia: Secondary | ICD-10-CM | POA: Diagnosis not present

## 2021-08-29 DIAGNOSIS — B181 Chronic viral hepatitis B without delta-agent: Secondary | ICD-10-CM | POA: Diagnosis not present

## 2021-08-29 DIAGNOSIS — J9600 Acute respiratory failure, unspecified whether with hypoxia or hypercapnia: Secondary | ICD-10-CM | POA: Diagnosis not present

## 2021-08-29 DIAGNOSIS — E039 Hypothyroidism, unspecified: Secondary | ICD-10-CM | POA: Diagnosis not present

## 2021-08-30 DIAGNOSIS — Z931 Gastrostomy status: Secondary | ICD-10-CM | POA: Diagnosis not present

## 2021-08-30 DIAGNOSIS — E063 Autoimmune thyroiditis: Secondary | ICD-10-CM | POA: Diagnosis not present

## 2021-08-30 DIAGNOSIS — R1310 Dysphagia, unspecified: Secondary | ICD-10-CM | POA: Diagnosis not present

## 2021-08-30 DIAGNOSIS — B181 Chronic viral hepatitis B without delta-agent: Secondary | ICD-10-CM | POA: Diagnosis not present

## 2021-08-30 DIAGNOSIS — Z930 Tracheostomy status: Secondary | ICD-10-CM | POA: Diagnosis not present

## 2021-08-30 DIAGNOSIS — E039 Hypothyroidism, unspecified: Secondary | ICD-10-CM | POA: Diagnosis not present

## 2021-08-30 DIAGNOSIS — Z862 Personal history of diseases of the blood and blood-forming organs and certain disorders involving the immune mechanism: Secondary | ICD-10-CM | POA: Diagnosis not present

## 2021-08-30 DIAGNOSIS — G909 Disorder of the autonomic nervous system, unspecified: Secondary | ICD-10-CM | POA: Diagnosis not present

## 2021-08-30 DIAGNOSIS — J9600 Acute respiratory failure, unspecified whether with hypoxia or hypercapnia: Secondary | ICD-10-CM | POA: Diagnosis not present

## 2021-08-30 DIAGNOSIS — G0481 Other encephalitis and encephalomyelitis: Secondary | ICD-10-CM | POA: Diagnosis not present

## 2021-08-30 DIAGNOSIS — R569 Unspecified convulsions: Secondary | ICD-10-CM | POA: Diagnosis not present

## 2021-08-31 DIAGNOSIS — B181 Chronic viral hepatitis B without delta-agent: Secondary | ICD-10-CM | POA: Diagnosis not present

## 2021-08-31 DIAGNOSIS — E063 Autoimmune thyroiditis: Secondary | ICD-10-CM | POA: Diagnosis not present

## 2021-08-31 DIAGNOSIS — J9600 Acute respiratory failure, unspecified whether with hypoxia or hypercapnia: Secondary | ICD-10-CM | POA: Diagnosis not present

## 2021-08-31 DIAGNOSIS — G909 Disorder of the autonomic nervous system, unspecified: Secondary | ICD-10-CM | POA: Diagnosis not present

## 2021-08-31 DIAGNOSIS — E039 Hypothyroidism, unspecified: Secondary | ICD-10-CM | POA: Diagnosis not present

## 2021-08-31 DIAGNOSIS — Z930 Tracheostomy status: Secondary | ICD-10-CM | POA: Diagnosis not present

## 2021-08-31 DIAGNOSIS — G0481 Other encephalitis and encephalomyelitis: Secondary | ICD-10-CM | POA: Diagnosis not present

## 2021-08-31 DIAGNOSIS — R569 Unspecified convulsions: Secondary | ICD-10-CM | POA: Diagnosis not present

## 2021-08-31 DIAGNOSIS — R1310 Dysphagia, unspecified: Secondary | ICD-10-CM | POA: Diagnosis not present

## 2021-09-01 DIAGNOSIS — Z930 Tracheostomy status: Secondary | ICD-10-CM | POA: Diagnosis not present

## 2021-09-01 DIAGNOSIS — J9600 Acute respiratory failure, unspecified whether with hypoxia or hypercapnia: Secondary | ICD-10-CM | POA: Diagnosis not present

## 2021-09-01 DIAGNOSIS — R1310 Dysphagia, unspecified: Secondary | ICD-10-CM | POA: Diagnosis not present

## 2021-09-01 DIAGNOSIS — E063 Autoimmune thyroiditis: Secondary | ICD-10-CM | POA: Diagnosis not present

## 2021-09-01 DIAGNOSIS — G0481 Other encephalitis and encephalomyelitis: Secondary | ICD-10-CM | POA: Diagnosis not present

## 2021-09-01 DIAGNOSIS — B181 Chronic viral hepatitis B without delta-agent: Secondary | ICD-10-CM | POA: Diagnosis not present

## 2021-09-01 DIAGNOSIS — E039 Hypothyroidism, unspecified: Secondary | ICD-10-CM | POA: Diagnosis not present

## 2021-09-01 DIAGNOSIS — G909 Disorder of the autonomic nervous system, unspecified: Secondary | ICD-10-CM | POA: Diagnosis not present

## 2021-09-01 DIAGNOSIS — R569 Unspecified convulsions: Secondary | ICD-10-CM | POA: Diagnosis not present

## 2021-09-02 DIAGNOSIS — Z930 Tracheostomy status: Secondary | ICD-10-CM | POA: Diagnosis not present

## 2021-09-02 DIAGNOSIS — G0481 Other encephalitis and encephalomyelitis: Secondary | ICD-10-CM | POA: Diagnosis not present

## 2021-09-02 DIAGNOSIS — G909 Disorder of the autonomic nervous system, unspecified: Secondary | ICD-10-CM | POA: Diagnosis not present

## 2021-09-02 DIAGNOSIS — E063 Autoimmune thyroiditis: Secondary | ICD-10-CM | POA: Diagnosis not present

## 2021-09-02 DIAGNOSIS — R1310 Dysphagia, unspecified: Secondary | ICD-10-CM | POA: Diagnosis not present

## 2021-09-02 DIAGNOSIS — J9600 Acute respiratory failure, unspecified whether with hypoxia or hypercapnia: Secondary | ICD-10-CM | POA: Diagnosis not present

## 2021-09-02 DIAGNOSIS — E039 Hypothyroidism, unspecified: Secondary | ICD-10-CM | POA: Diagnosis not present

## 2021-09-02 DIAGNOSIS — R569 Unspecified convulsions: Secondary | ICD-10-CM | POA: Diagnosis not present

## 2021-09-02 DIAGNOSIS — B181 Chronic viral hepatitis B without delta-agent: Secondary | ICD-10-CM | POA: Diagnosis not present

## 2021-09-03 DIAGNOSIS — G0481 Other encephalitis and encephalomyelitis: Secondary | ICD-10-CM | POA: Diagnosis not present

## 2021-09-03 DIAGNOSIS — R569 Unspecified convulsions: Secondary | ICD-10-CM | POA: Diagnosis not present

## 2021-09-03 DIAGNOSIS — B181 Chronic viral hepatitis B without delta-agent: Secondary | ICD-10-CM | POA: Diagnosis not present

## 2021-09-03 DIAGNOSIS — E063 Autoimmune thyroiditis: Secondary | ICD-10-CM | POA: Diagnosis not present

## 2021-09-03 DIAGNOSIS — R1310 Dysphagia, unspecified: Secondary | ICD-10-CM | POA: Diagnosis not present

## 2021-09-03 DIAGNOSIS — E039 Hypothyroidism, unspecified: Secondary | ICD-10-CM | POA: Diagnosis not present

## 2021-09-03 DIAGNOSIS — J9600 Acute respiratory failure, unspecified whether with hypoxia or hypercapnia: Secondary | ICD-10-CM | POA: Diagnosis not present

## 2021-09-03 DIAGNOSIS — Z930 Tracheostomy status: Secondary | ICD-10-CM | POA: Diagnosis not present

## 2021-09-03 DIAGNOSIS — G909 Disorder of the autonomic nervous system, unspecified: Secondary | ICD-10-CM | POA: Diagnosis not present

## 2021-09-04 DIAGNOSIS — R1310 Dysphagia, unspecified: Secondary | ICD-10-CM | POA: Diagnosis not present

## 2021-09-04 DIAGNOSIS — R569 Unspecified convulsions: Secondary | ICD-10-CM | POA: Diagnosis not present

## 2021-09-04 DIAGNOSIS — E063 Autoimmune thyroiditis: Secondary | ICD-10-CM | POA: Diagnosis not present

## 2021-09-04 DIAGNOSIS — Z930 Tracheostomy status: Secondary | ICD-10-CM | POA: Diagnosis not present

## 2021-09-04 DIAGNOSIS — E039 Hypothyroidism, unspecified: Secondary | ICD-10-CM | POA: Diagnosis not present

## 2021-09-04 DIAGNOSIS — J9600 Acute respiratory failure, unspecified whether with hypoxia or hypercapnia: Secondary | ICD-10-CM | POA: Diagnosis not present

## 2021-09-04 DIAGNOSIS — G909 Disorder of the autonomic nervous system, unspecified: Secondary | ICD-10-CM | POA: Diagnosis not present

## 2021-09-04 DIAGNOSIS — B181 Chronic viral hepatitis B without delta-agent: Secondary | ICD-10-CM | POA: Diagnosis not present

## 2021-09-04 DIAGNOSIS — G0481 Other encephalitis and encephalomyelitis: Secondary | ICD-10-CM | POA: Diagnosis not present

## 2021-09-05 DIAGNOSIS — R569 Unspecified convulsions: Secondary | ICD-10-CM | POA: Diagnosis not present

## 2021-09-05 DIAGNOSIS — Z930 Tracheostomy status: Secondary | ICD-10-CM | POA: Diagnosis not present

## 2021-09-05 DIAGNOSIS — J9600 Acute respiratory failure, unspecified whether with hypoxia or hypercapnia: Secondary | ICD-10-CM | POA: Diagnosis not present

## 2021-09-05 DIAGNOSIS — B181 Chronic viral hepatitis B without delta-agent: Secondary | ICD-10-CM | POA: Diagnosis not present

## 2021-09-05 DIAGNOSIS — E039 Hypothyroidism, unspecified: Secondary | ICD-10-CM | POA: Diagnosis not present

## 2021-09-05 DIAGNOSIS — G909 Disorder of the autonomic nervous system, unspecified: Secondary | ICD-10-CM | POA: Diagnosis not present

## 2021-09-05 DIAGNOSIS — G0481 Other encephalitis and encephalomyelitis: Secondary | ICD-10-CM | POA: Diagnosis not present

## 2021-09-05 DIAGNOSIS — R1310 Dysphagia, unspecified: Secondary | ICD-10-CM | POA: Diagnosis not present

## 2021-09-05 DIAGNOSIS — E063 Autoimmune thyroiditis: Secondary | ICD-10-CM | POA: Diagnosis not present

## 2021-09-06 DIAGNOSIS — R1310 Dysphagia, unspecified: Secondary | ICD-10-CM | POA: Diagnosis not present

## 2021-09-06 DIAGNOSIS — B181 Chronic viral hepatitis B without delta-agent: Secondary | ICD-10-CM | POA: Diagnosis not present

## 2021-09-06 DIAGNOSIS — G0481 Other encephalitis and encephalomyelitis: Secondary | ICD-10-CM | POA: Diagnosis not present

## 2021-09-06 DIAGNOSIS — E063 Autoimmune thyroiditis: Secondary | ICD-10-CM | POA: Diagnosis not present

## 2021-09-06 DIAGNOSIS — J9600 Acute respiratory failure, unspecified whether with hypoxia or hypercapnia: Secondary | ICD-10-CM | POA: Diagnosis not present

## 2021-09-06 DIAGNOSIS — G909 Disorder of the autonomic nervous system, unspecified: Secondary | ICD-10-CM | POA: Diagnosis not present

## 2021-09-06 DIAGNOSIS — Z930 Tracheostomy status: Secondary | ICD-10-CM | POA: Diagnosis not present

## 2021-09-06 DIAGNOSIS — E039 Hypothyroidism, unspecified: Secondary | ICD-10-CM | POA: Diagnosis not present

## 2021-09-06 DIAGNOSIS — R569 Unspecified convulsions: Secondary | ICD-10-CM | POA: Diagnosis not present

## 2021-09-07 DIAGNOSIS — E039 Hypothyroidism, unspecified: Secondary | ICD-10-CM | POA: Diagnosis not present

## 2021-09-07 DIAGNOSIS — Z930 Tracheostomy status: Secondary | ICD-10-CM | POA: Diagnosis not present

## 2021-09-07 DIAGNOSIS — R569 Unspecified convulsions: Secondary | ICD-10-CM | POA: Diagnosis not present

## 2021-09-07 DIAGNOSIS — J9600 Acute respiratory failure, unspecified whether with hypoxia or hypercapnia: Secondary | ICD-10-CM | POA: Diagnosis not present

## 2021-09-07 DIAGNOSIS — G0481 Other encephalitis and encephalomyelitis: Secondary | ICD-10-CM | POA: Diagnosis not present

## 2021-09-07 DIAGNOSIS — B181 Chronic viral hepatitis B without delta-agent: Secondary | ICD-10-CM | POA: Diagnosis not present

## 2021-09-07 DIAGNOSIS — E063 Autoimmune thyroiditis: Secondary | ICD-10-CM | POA: Diagnosis not present

## 2021-09-07 DIAGNOSIS — R1310 Dysphagia, unspecified: Secondary | ICD-10-CM | POA: Diagnosis not present

## 2021-09-07 DIAGNOSIS — G909 Disorder of the autonomic nervous system, unspecified: Secondary | ICD-10-CM | POA: Diagnosis not present

## 2021-09-08 DIAGNOSIS — E039 Hypothyroidism, unspecified: Secondary | ICD-10-CM | POA: Diagnosis not present

## 2021-09-08 DIAGNOSIS — B181 Chronic viral hepatitis B without delta-agent: Secondary | ICD-10-CM | POA: Diagnosis not present

## 2021-09-08 DIAGNOSIS — E876 Hypokalemia: Secondary | ICD-10-CM | POA: Diagnosis not present

## 2021-09-08 DIAGNOSIS — E063 Autoimmune thyroiditis: Secondary | ICD-10-CM | POA: Diagnosis not present

## 2021-09-08 DIAGNOSIS — R569 Unspecified convulsions: Secondary | ICD-10-CM | POA: Diagnosis not present

## 2021-09-08 DIAGNOSIS — G0481 Other encephalitis and encephalomyelitis: Secondary | ICD-10-CM | POA: Diagnosis not present

## 2021-09-08 DIAGNOSIS — R1310 Dysphagia, unspecified: Secondary | ICD-10-CM | POA: Diagnosis not present

## 2021-09-08 DIAGNOSIS — J9600 Acute respiratory failure, unspecified whether with hypoxia or hypercapnia: Secondary | ICD-10-CM | POA: Diagnosis not present

## 2021-09-09 DIAGNOSIS — R5081 Fever presenting with conditions classified elsewhere: Secondary | ICD-10-CM | POA: Diagnosis not present

## 2021-09-09 DIAGNOSIS — E876 Hypokalemia: Secondary | ICD-10-CM | POA: Diagnosis not present

## 2021-09-09 DIAGNOSIS — E063 Autoimmune thyroiditis: Secondary | ICD-10-CM | POA: Diagnosis not present

## 2021-09-09 DIAGNOSIS — B181 Chronic viral hepatitis B without delta-agent: Secondary | ICD-10-CM | POA: Diagnosis not present

## 2021-09-09 DIAGNOSIS — R569 Unspecified convulsions: Secondary | ICD-10-CM | POA: Diagnosis not present

## 2021-09-09 DIAGNOSIS — J9600 Acute respiratory failure, unspecified whether with hypoxia or hypercapnia: Secondary | ICD-10-CM | POA: Diagnosis not present

## 2021-09-09 DIAGNOSIS — R918 Other nonspecific abnormal finding of lung field: Secondary | ICD-10-CM | POA: Diagnosis not present

## 2021-09-09 DIAGNOSIS — R1310 Dysphagia, unspecified: Secondary | ICD-10-CM | POA: Diagnosis not present

## 2021-09-09 DIAGNOSIS — G0481 Other encephalitis and encephalomyelitis: Secondary | ICD-10-CM | POA: Diagnosis not present

## 2021-09-09 DIAGNOSIS — E039 Hypothyroidism, unspecified: Secondary | ICD-10-CM | POA: Diagnosis not present

## 2021-09-10 DIAGNOSIS — R509 Fever, unspecified: Secondary | ICD-10-CM | POA: Diagnosis not present

## 2021-09-10 DIAGNOSIS — J9600 Acute respiratory failure, unspecified whether with hypoxia or hypercapnia: Secondary | ICD-10-CM | POA: Diagnosis not present

## 2021-09-10 DIAGNOSIS — Z9229 Personal history of other drug therapy: Secondary | ICD-10-CM | POA: Diagnosis not present

## 2021-09-10 DIAGNOSIS — R569 Unspecified convulsions: Secondary | ICD-10-CM | POA: Diagnosis not present

## 2021-09-10 DIAGNOSIS — Z8619 Personal history of other infectious and parasitic diseases: Secondary | ICD-10-CM | POA: Diagnosis not present

## 2021-09-10 DIAGNOSIS — E063 Autoimmune thyroiditis: Secondary | ICD-10-CM | POA: Diagnosis not present

## 2021-09-10 DIAGNOSIS — G0481 Other encephalitis and encephalomyelitis: Secondary | ICD-10-CM | POA: Diagnosis not present

## 2021-09-10 DIAGNOSIS — E876 Hypokalemia: Secondary | ICD-10-CM | POA: Diagnosis not present

## 2021-09-10 DIAGNOSIS — E039 Hypothyroidism, unspecified: Secondary | ICD-10-CM | POA: Diagnosis not present

## 2021-09-10 DIAGNOSIS — B181 Chronic viral hepatitis B without delta-agent: Secondary | ICD-10-CM | POA: Diagnosis not present

## 2021-09-10 DIAGNOSIS — R1310 Dysphagia, unspecified: Secondary | ICD-10-CM | POA: Diagnosis not present

## 2021-09-11 DIAGNOSIS — R1310 Dysphagia, unspecified: Secondary | ICD-10-CM | POA: Diagnosis not present

## 2021-09-11 DIAGNOSIS — E039 Hypothyroidism, unspecified: Secondary | ICD-10-CM | POA: Diagnosis not present

## 2021-09-11 DIAGNOSIS — E063 Autoimmune thyroiditis: Secondary | ICD-10-CM | POA: Diagnosis not present

## 2021-09-11 DIAGNOSIS — B181 Chronic viral hepatitis B without delta-agent: Secondary | ICD-10-CM | POA: Diagnosis not present

## 2021-09-11 DIAGNOSIS — E876 Hypokalemia: Secondary | ICD-10-CM | POA: Diagnosis not present

## 2021-09-11 DIAGNOSIS — J9600 Acute respiratory failure, unspecified whether with hypoxia or hypercapnia: Secondary | ICD-10-CM | POA: Diagnosis not present

## 2021-09-11 DIAGNOSIS — G0481 Other encephalitis and encephalomyelitis: Secondary | ICD-10-CM | POA: Diagnosis not present

## 2021-09-11 DIAGNOSIS — R569 Unspecified convulsions: Secondary | ICD-10-CM | POA: Diagnosis not present

## 2021-09-12 DIAGNOSIS — E063 Autoimmune thyroiditis: Secondary | ICD-10-CM | POA: Diagnosis not present

## 2021-09-12 DIAGNOSIS — E876 Hypokalemia: Secondary | ICD-10-CM | POA: Diagnosis not present

## 2021-09-12 DIAGNOSIS — G0481 Other encephalitis and encephalomyelitis: Secondary | ICD-10-CM | POA: Diagnosis not present

## 2021-09-12 DIAGNOSIS — B181 Chronic viral hepatitis B without delta-agent: Secondary | ICD-10-CM | POA: Diagnosis not present

## 2021-09-12 DIAGNOSIS — J9600 Acute respiratory failure, unspecified whether with hypoxia or hypercapnia: Secondary | ICD-10-CM | POA: Diagnosis not present

## 2021-09-12 DIAGNOSIS — E039 Hypothyroidism, unspecified: Secondary | ICD-10-CM | POA: Diagnosis not present

## 2021-09-12 DIAGNOSIS — R1310 Dysphagia, unspecified: Secondary | ICD-10-CM | POA: Diagnosis not present

## 2021-09-12 DIAGNOSIS — R569 Unspecified convulsions: Secondary | ICD-10-CM | POA: Diagnosis not present

## 2021-09-13 DIAGNOSIS — E039 Hypothyroidism, unspecified: Secondary | ICD-10-CM | POA: Diagnosis not present

## 2021-09-13 DIAGNOSIS — E063 Autoimmune thyroiditis: Secondary | ICD-10-CM | POA: Diagnosis not present

## 2021-09-13 DIAGNOSIS — R569 Unspecified convulsions: Secondary | ICD-10-CM | POA: Diagnosis not present

## 2021-09-13 DIAGNOSIS — B181 Chronic viral hepatitis B without delta-agent: Secondary | ICD-10-CM | POA: Diagnosis not present

## 2021-09-13 DIAGNOSIS — G0481 Other encephalitis and encephalomyelitis: Secondary | ICD-10-CM | POA: Diagnosis not present

## 2021-09-13 DIAGNOSIS — Z8619 Personal history of other infectious and parasitic diseases: Secondary | ICD-10-CM | POA: Diagnosis not present

## 2021-09-13 DIAGNOSIS — R1310 Dysphagia, unspecified: Secondary | ICD-10-CM | POA: Diagnosis not present

## 2021-09-13 DIAGNOSIS — Z9229 Personal history of other drug therapy: Secondary | ICD-10-CM | POA: Diagnosis not present

## 2021-09-13 DIAGNOSIS — J9600 Acute respiratory failure, unspecified whether with hypoxia or hypercapnia: Secondary | ICD-10-CM | POA: Diagnosis not present

## 2021-09-13 DIAGNOSIS — E876 Hypokalemia: Secondary | ICD-10-CM | POA: Diagnosis not present

## 2021-09-14 DIAGNOSIS — G0481 Other encephalitis and encephalomyelitis: Secondary | ICD-10-CM | POA: Diagnosis not present

## 2021-09-14 DIAGNOSIS — R569 Unspecified convulsions: Secondary | ICD-10-CM | POA: Diagnosis not present

## 2021-09-14 DIAGNOSIS — B181 Chronic viral hepatitis B without delta-agent: Secondary | ICD-10-CM | POA: Diagnosis not present

## 2021-09-14 DIAGNOSIS — E039 Hypothyroidism, unspecified: Secondary | ICD-10-CM | POA: Diagnosis not present

## 2021-09-14 DIAGNOSIS — J9600 Acute respiratory failure, unspecified whether with hypoxia or hypercapnia: Secondary | ICD-10-CM | POA: Diagnosis not present

## 2021-09-14 DIAGNOSIS — E063 Autoimmune thyroiditis: Secondary | ICD-10-CM | POA: Diagnosis not present

## 2021-09-14 DIAGNOSIS — R1310 Dysphagia, unspecified: Secondary | ICD-10-CM | POA: Diagnosis not present

## 2021-09-15 DIAGNOSIS — R1310 Dysphagia, unspecified: Secondary | ICD-10-CM | POA: Diagnosis not present

## 2021-09-15 DIAGNOSIS — B181 Chronic viral hepatitis B without delta-agent: Secondary | ICD-10-CM | POA: Diagnosis not present

## 2021-09-15 DIAGNOSIS — E039 Hypothyroidism, unspecified: Secondary | ICD-10-CM | POA: Diagnosis not present

## 2021-09-15 DIAGNOSIS — G0481 Other encephalitis and encephalomyelitis: Secondary | ICD-10-CM | POA: Diagnosis not present

## 2021-09-15 DIAGNOSIS — J9600 Acute respiratory failure, unspecified whether with hypoxia or hypercapnia: Secondary | ICD-10-CM | POA: Diagnosis not present

## 2021-09-15 DIAGNOSIS — E063 Autoimmune thyroiditis: Secondary | ICD-10-CM | POA: Diagnosis not present

## 2021-09-15 DIAGNOSIS — R569 Unspecified convulsions: Secondary | ICD-10-CM | POA: Diagnosis not present

## 2021-09-16 DIAGNOSIS — B181 Chronic viral hepatitis B without delta-agent: Secondary | ICD-10-CM | POA: Diagnosis not present

## 2021-09-16 DIAGNOSIS — G0481 Other encephalitis and encephalomyelitis: Secondary | ICD-10-CM | POA: Diagnosis not present

## 2021-09-16 DIAGNOSIS — R569 Unspecified convulsions: Secondary | ICD-10-CM | POA: Diagnosis not present

## 2021-09-16 DIAGNOSIS — R1310 Dysphagia, unspecified: Secondary | ICD-10-CM | POA: Diagnosis not present

## 2021-09-16 DIAGNOSIS — E063 Autoimmune thyroiditis: Secondary | ICD-10-CM | POA: Diagnosis not present

## 2021-09-16 DIAGNOSIS — J9600 Acute respiratory failure, unspecified whether with hypoxia or hypercapnia: Secondary | ICD-10-CM | POA: Diagnosis not present

## 2021-09-16 DIAGNOSIS — E039 Hypothyroidism, unspecified: Secondary | ICD-10-CM | POA: Diagnosis not present

## 2021-09-17 DIAGNOSIS — J9600 Acute respiratory failure, unspecified whether with hypoxia or hypercapnia: Secondary | ICD-10-CM | POA: Diagnosis not present

## 2021-09-17 DIAGNOSIS — G0481 Other encephalitis and encephalomyelitis: Secondary | ICD-10-CM | POA: Diagnosis not present

## 2021-09-17 DIAGNOSIS — E063 Autoimmune thyroiditis: Secondary | ICD-10-CM | POA: Diagnosis not present

## 2021-09-17 DIAGNOSIS — R1310 Dysphagia, unspecified: Secondary | ICD-10-CM | POA: Diagnosis not present

## 2021-09-17 DIAGNOSIS — E039 Hypothyroidism, unspecified: Secondary | ICD-10-CM | POA: Diagnosis not present

## 2021-09-17 DIAGNOSIS — R569 Unspecified convulsions: Secondary | ICD-10-CM | POA: Diagnosis not present

## 2021-09-17 DIAGNOSIS — B181 Chronic viral hepatitis B without delta-agent: Secondary | ICD-10-CM | POA: Diagnosis not present

## 2021-09-18 DIAGNOSIS — G0481 Other encephalitis and encephalomyelitis: Secondary | ICD-10-CM | POA: Diagnosis not present

## 2021-09-18 DIAGNOSIS — R569 Unspecified convulsions: Secondary | ICD-10-CM | POA: Diagnosis not present

## 2021-09-18 DIAGNOSIS — J9600 Acute respiratory failure, unspecified whether with hypoxia or hypercapnia: Secondary | ICD-10-CM | POA: Diagnosis not present

## 2021-09-18 DIAGNOSIS — B181 Chronic viral hepatitis B without delta-agent: Secondary | ICD-10-CM | POA: Diagnosis not present

## 2021-09-18 DIAGNOSIS — R1310 Dysphagia, unspecified: Secondary | ICD-10-CM | POA: Diagnosis not present

## 2021-09-18 DIAGNOSIS — E063 Autoimmune thyroiditis: Secondary | ICD-10-CM | POA: Diagnosis not present

## 2021-09-18 DIAGNOSIS — E039 Hypothyroidism, unspecified: Secondary | ICD-10-CM | POA: Diagnosis not present

## 2021-09-19 DIAGNOSIS — E039 Hypothyroidism, unspecified: Secondary | ICD-10-CM | POA: Diagnosis not present

## 2021-09-19 DIAGNOSIS — E063 Autoimmune thyroiditis: Secondary | ICD-10-CM | POA: Diagnosis not present

## 2021-09-19 DIAGNOSIS — B181 Chronic viral hepatitis B without delta-agent: Secondary | ICD-10-CM | POA: Diagnosis not present

## 2021-09-19 DIAGNOSIS — R1310 Dysphagia, unspecified: Secondary | ICD-10-CM | POA: Diagnosis not present

## 2021-09-19 DIAGNOSIS — J9600 Acute respiratory failure, unspecified whether with hypoxia or hypercapnia: Secondary | ICD-10-CM | POA: Diagnosis not present

## 2021-09-19 DIAGNOSIS — G0481 Other encephalitis and encephalomyelitis: Secondary | ICD-10-CM | POA: Diagnosis not present

## 2021-09-19 DIAGNOSIS — R569 Unspecified convulsions: Secondary | ICD-10-CM | POA: Diagnosis not present

## 2021-09-20 DIAGNOSIS — E063 Autoimmune thyroiditis: Secondary | ICD-10-CM | POA: Diagnosis not present

## 2021-09-20 DIAGNOSIS — B181 Chronic viral hepatitis B without delta-agent: Secondary | ICD-10-CM | POA: Diagnosis not present

## 2021-09-20 DIAGNOSIS — G0481 Other encephalitis and encephalomyelitis: Secondary | ICD-10-CM | POA: Diagnosis not present

## 2021-09-20 DIAGNOSIS — R1310 Dysphagia, unspecified: Secondary | ICD-10-CM | POA: Diagnosis not present

## 2021-09-20 DIAGNOSIS — J9600 Acute respiratory failure, unspecified whether with hypoxia or hypercapnia: Secondary | ICD-10-CM | POA: Diagnosis not present

## 2021-09-20 DIAGNOSIS — R569 Unspecified convulsions: Secondary | ICD-10-CM | POA: Diagnosis not present

## 2021-09-20 DIAGNOSIS — E039 Hypothyroidism, unspecified: Secondary | ICD-10-CM | POA: Diagnosis not present

## 2021-09-21 DIAGNOSIS — Z930 Tracheostomy status: Secondary | ICD-10-CM | POA: Diagnosis not present

## 2021-09-21 DIAGNOSIS — E063 Autoimmune thyroiditis: Secondary | ICD-10-CM | POA: Diagnosis not present

## 2021-09-21 DIAGNOSIS — R509 Fever, unspecified: Secondary | ICD-10-CM | POA: Diagnosis not present

## 2021-09-21 DIAGNOSIS — R1310 Dysphagia, unspecified: Secondary | ICD-10-CM | POA: Diagnosis not present

## 2021-09-21 DIAGNOSIS — G0481 Other encephalitis and encephalomyelitis: Secondary | ICD-10-CM | POA: Diagnosis not present

## 2021-09-21 DIAGNOSIS — J9600 Acute respiratory failure, unspecified whether with hypoxia or hypercapnia: Secondary | ICD-10-CM | POA: Diagnosis not present

## 2021-09-21 DIAGNOSIS — B181 Chronic viral hepatitis B without delta-agent: Secondary | ICD-10-CM | POA: Diagnosis not present

## 2021-09-21 DIAGNOSIS — R569 Unspecified convulsions: Secondary | ICD-10-CM | POA: Diagnosis not present

## 2021-09-21 DIAGNOSIS — E039 Hypothyroidism, unspecified: Secondary | ICD-10-CM | POA: Diagnosis not present

## 2021-09-21 DIAGNOSIS — R1110 Vomiting, unspecified: Secondary | ICD-10-CM | POA: Diagnosis not present

## 2021-09-22 DIAGNOSIS — R1310 Dysphagia, unspecified: Secondary | ICD-10-CM | POA: Diagnosis not present

## 2021-09-22 DIAGNOSIS — R509 Fever, unspecified: Secondary | ICD-10-CM | POA: Diagnosis not present

## 2021-09-22 DIAGNOSIS — E063 Autoimmune thyroiditis: Secondary | ICD-10-CM | POA: Diagnosis not present

## 2021-09-22 DIAGNOSIS — J9600 Acute respiratory failure, unspecified whether with hypoxia or hypercapnia: Secondary | ICD-10-CM | POA: Diagnosis not present

## 2021-09-22 DIAGNOSIS — R569 Unspecified convulsions: Secondary | ICD-10-CM | POA: Diagnosis not present

## 2021-09-22 DIAGNOSIS — B181 Chronic viral hepatitis B without delta-agent: Secondary | ICD-10-CM | POA: Diagnosis not present

## 2021-09-22 DIAGNOSIS — E039 Hypothyroidism, unspecified: Secondary | ICD-10-CM | POA: Diagnosis not present

## 2021-09-22 DIAGNOSIS — G0481 Other encephalitis and encephalomyelitis: Secondary | ICD-10-CM | POA: Diagnosis not present

## 2021-09-22 DIAGNOSIS — R1110 Vomiting, unspecified: Secondary | ICD-10-CM | POA: Diagnosis not present

## 2021-09-22 DIAGNOSIS — Z930 Tracheostomy status: Secondary | ICD-10-CM | POA: Diagnosis not present

## 2021-09-23 DIAGNOSIS — R569 Unspecified convulsions: Secondary | ICD-10-CM | POA: Diagnosis not present

## 2021-09-23 DIAGNOSIS — J9611 Chronic respiratory failure with hypoxia: Secondary | ICD-10-CM | POA: Diagnosis not present

## 2021-09-23 DIAGNOSIS — E063 Autoimmune thyroiditis: Secondary | ICD-10-CM | POA: Diagnosis not present

## 2021-09-23 DIAGNOSIS — J9600 Acute respiratory failure, unspecified whether with hypoxia or hypercapnia: Secondary | ICD-10-CM | POA: Diagnosis not present

## 2021-09-23 DIAGNOSIS — E039 Hypothyroidism, unspecified: Secondary | ICD-10-CM | POA: Diagnosis not present

## 2021-09-23 DIAGNOSIS — B181 Chronic viral hepatitis B without delta-agent: Secondary | ICD-10-CM | POA: Diagnosis not present

## 2021-09-23 DIAGNOSIS — R1310 Dysphagia, unspecified: Secondary | ICD-10-CM | POA: Diagnosis not present

## 2021-09-23 DIAGNOSIS — R1110 Vomiting, unspecified: Secondary | ICD-10-CM | POA: Diagnosis not present

## 2021-09-23 DIAGNOSIS — G0481 Other encephalitis and encephalomyelitis: Secondary | ICD-10-CM | POA: Diagnosis not present

## 2021-09-23 DIAGNOSIS — Z930 Tracheostomy status: Secondary | ICD-10-CM | POA: Diagnosis not present

## 2021-09-23 DIAGNOSIS — R509 Fever, unspecified: Secondary | ICD-10-CM | POA: Diagnosis not present

## 2021-09-24 DIAGNOSIS — J9600 Acute respiratory failure, unspecified whether with hypoxia or hypercapnia: Secondary | ICD-10-CM | POA: Diagnosis not present

## 2021-09-24 DIAGNOSIS — Z930 Tracheostomy status: Secondary | ICD-10-CM | POA: Diagnosis not present

## 2021-09-24 DIAGNOSIS — B181 Chronic viral hepatitis B without delta-agent: Secondary | ICD-10-CM | POA: Diagnosis not present

## 2021-09-24 DIAGNOSIS — E039 Hypothyroidism, unspecified: Secondary | ICD-10-CM | POA: Diagnosis not present

## 2021-09-24 DIAGNOSIS — G0481 Other encephalitis and encephalomyelitis: Secondary | ICD-10-CM | POA: Diagnosis not present

## 2021-09-24 DIAGNOSIS — R1310 Dysphagia, unspecified: Secondary | ICD-10-CM | POA: Diagnosis not present

## 2021-09-24 DIAGNOSIS — E063 Autoimmune thyroiditis: Secondary | ICD-10-CM | POA: Diagnosis not present

## 2021-09-24 DIAGNOSIS — R509 Fever, unspecified: Secondary | ICD-10-CM | POA: Diagnosis not present

## 2021-09-24 DIAGNOSIS — R1110 Vomiting, unspecified: Secondary | ICD-10-CM | POA: Diagnosis not present

## 2021-09-24 DIAGNOSIS — R569 Unspecified convulsions: Secondary | ICD-10-CM | POA: Diagnosis not present

## 2021-09-25 DIAGNOSIS — Z930 Tracheostomy status: Secondary | ICD-10-CM | POA: Diagnosis not present

## 2021-09-25 DIAGNOSIS — R509 Fever, unspecified: Secondary | ICD-10-CM | POA: Diagnosis not present

## 2021-09-25 DIAGNOSIS — R1310 Dysphagia, unspecified: Secondary | ICD-10-CM | POA: Diagnosis not present

## 2021-09-25 DIAGNOSIS — G0481 Other encephalitis and encephalomyelitis: Secondary | ICD-10-CM | POA: Diagnosis not present

## 2021-09-25 DIAGNOSIS — B181 Chronic viral hepatitis B without delta-agent: Secondary | ICD-10-CM | POA: Diagnosis not present

## 2021-09-25 DIAGNOSIS — R569 Unspecified convulsions: Secondary | ICD-10-CM | POA: Diagnosis not present

## 2021-09-25 DIAGNOSIS — E063 Autoimmune thyroiditis: Secondary | ICD-10-CM | POA: Diagnosis not present

## 2021-09-25 DIAGNOSIS — E039 Hypothyroidism, unspecified: Secondary | ICD-10-CM | POA: Diagnosis not present

## 2021-09-25 DIAGNOSIS — J9600 Acute respiratory failure, unspecified whether with hypoxia or hypercapnia: Secondary | ICD-10-CM | POA: Diagnosis not present

## 2021-09-26 DIAGNOSIS — B181 Chronic viral hepatitis B without delta-agent: Secondary | ICD-10-CM | POA: Diagnosis not present

## 2021-09-26 DIAGNOSIS — E063 Autoimmune thyroiditis: Secondary | ICD-10-CM | POA: Diagnosis not present

## 2021-09-26 DIAGNOSIS — J9600 Acute respiratory failure, unspecified whether with hypoxia or hypercapnia: Secondary | ICD-10-CM | POA: Diagnosis not present

## 2021-09-26 DIAGNOSIS — E039 Hypothyroidism, unspecified: Secondary | ICD-10-CM | POA: Diagnosis not present

## 2021-09-26 DIAGNOSIS — R509 Fever, unspecified: Secondary | ICD-10-CM | POA: Diagnosis not present

## 2021-09-26 DIAGNOSIS — R1310 Dysphagia, unspecified: Secondary | ICD-10-CM | POA: Diagnosis not present

## 2021-09-26 DIAGNOSIS — Z930 Tracheostomy status: Secondary | ICD-10-CM | POA: Diagnosis not present

## 2021-09-26 DIAGNOSIS — R569 Unspecified convulsions: Secondary | ICD-10-CM | POA: Diagnosis not present

## 2021-09-26 DIAGNOSIS — G0481 Other encephalitis and encephalomyelitis: Secondary | ICD-10-CM | POA: Diagnosis not present

## 2021-09-27 DIAGNOSIS — G909 Disorder of the autonomic nervous system, unspecified: Secondary | ICD-10-CM | POA: Diagnosis not present

## 2021-09-27 DIAGNOSIS — R1310 Dysphagia, unspecified: Secondary | ICD-10-CM | POA: Diagnosis not present

## 2021-09-27 DIAGNOSIS — E039 Hypothyroidism, unspecified: Secondary | ICD-10-CM | POA: Diagnosis not present

## 2021-09-27 DIAGNOSIS — G0481 Other encephalitis and encephalomyelitis: Secondary | ICD-10-CM | POA: Diagnosis not present

## 2021-09-27 DIAGNOSIS — R509 Fever, unspecified: Secondary | ICD-10-CM | POA: Diagnosis not present

## 2021-09-27 DIAGNOSIS — E063 Autoimmune thyroiditis: Secondary | ICD-10-CM | POA: Diagnosis not present

## 2021-09-27 DIAGNOSIS — Z930 Tracheostomy status: Secondary | ICD-10-CM | POA: Diagnosis not present

## 2021-09-27 DIAGNOSIS — B181 Chronic viral hepatitis B without delta-agent: Secondary | ICD-10-CM | POA: Diagnosis not present

## 2021-09-27 DIAGNOSIS — R569 Unspecified convulsions: Secondary | ICD-10-CM | POA: Diagnosis not present

## 2021-09-27 DIAGNOSIS — J9600 Acute respiratory failure, unspecified whether with hypoxia or hypercapnia: Secondary | ICD-10-CM | POA: Diagnosis not present

## 2021-09-28 DIAGNOSIS — E063 Autoimmune thyroiditis: Secondary | ICD-10-CM | POA: Diagnosis not present

## 2021-09-28 DIAGNOSIS — R569 Unspecified convulsions: Secondary | ICD-10-CM | POA: Diagnosis not present

## 2021-09-28 DIAGNOSIS — J9600 Acute respiratory failure, unspecified whether with hypoxia or hypercapnia: Secondary | ICD-10-CM | POA: Diagnosis not present

## 2021-09-28 DIAGNOSIS — E039 Hypothyroidism, unspecified: Secondary | ICD-10-CM | POA: Diagnosis not present

## 2021-09-28 DIAGNOSIS — G909 Disorder of the autonomic nervous system, unspecified: Secondary | ICD-10-CM | POA: Diagnosis not present

## 2021-09-28 DIAGNOSIS — R1310 Dysphagia, unspecified: Secondary | ICD-10-CM | POA: Diagnosis not present

## 2021-09-28 DIAGNOSIS — G0481 Other encephalitis and encephalomyelitis: Secondary | ICD-10-CM | POA: Diagnosis not present

## 2021-09-28 DIAGNOSIS — Z930 Tracheostomy status: Secondary | ICD-10-CM | POA: Diagnosis not present

## 2021-09-28 DIAGNOSIS — R509 Fever, unspecified: Secondary | ICD-10-CM | POA: Diagnosis not present

## 2021-09-28 DIAGNOSIS — B181 Chronic viral hepatitis B without delta-agent: Secondary | ICD-10-CM | POA: Diagnosis not present

## 2021-09-28 DIAGNOSIS — Z9911 Dependence on respirator [ventilator] status: Secondary | ICD-10-CM | POA: Diagnosis not present

## 2021-09-29 DIAGNOSIS — R509 Fever, unspecified: Secondary | ICD-10-CM | POA: Diagnosis not present

## 2021-09-29 DIAGNOSIS — B181 Chronic viral hepatitis B without delta-agent: Secondary | ICD-10-CM | POA: Diagnosis not present

## 2021-09-29 DIAGNOSIS — E039 Hypothyroidism, unspecified: Secondary | ICD-10-CM | POA: Diagnosis not present

## 2021-09-29 DIAGNOSIS — R569 Unspecified convulsions: Secondary | ICD-10-CM | POA: Diagnosis not present

## 2021-09-29 DIAGNOSIS — E063 Autoimmune thyroiditis: Secondary | ICD-10-CM | POA: Diagnosis not present

## 2021-09-29 DIAGNOSIS — G0481 Other encephalitis and encephalomyelitis: Secondary | ICD-10-CM | POA: Diagnosis not present

## 2021-09-29 DIAGNOSIS — R1310 Dysphagia, unspecified: Secondary | ICD-10-CM | POA: Diagnosis not present

## 2021-09-29 DIAGNOSIS — Z930 Tracheostomy status: Secondary | ICD-10-CM | POA: Diagnosis not present

## 2021-09-29 DIAGNOSIS — G909 Disorder of the autonomic nervous system, unspecified: Secondary | ICD-10-CM | POA: Diagnosis not present

## 2021-09-29 DIAGNOSIS — Z9911 Dependence on respirator [ventilator] status: Secondary | ICD-10-CM | POA: Diagnosis not present

## 2021-09-29 DIAGNOSIS — J9600 Acute respiratory failure, unspecified whether with hypoxia or hypercapnia: Secondary | ICD-10-CM | POA: Diagnosis not present

## 2021-09-30 DIAGNOSIS — E039 Hypothyroidism, unspecified: Secondary | ICD-10-CM | POA: Diagnosis not present

## 2021-09-30 DIAGNOSIS — E063 Autoimmune thyroiditis: Secondary | ICD-10-CM | POA: Diagnosis not present

## 2021-09-30 DIAGNOSIS — R569 Unspecified convulsions: Secondary | ICD-10-CM | POA: Diagnosis not present

## 2021-09-30 DIAGNOSIS — J9600 Acute respiratory failure, unspecified whether with hypoxia or hypercapnia: Secondary | ICD-10-CM | POA: Diagnosis not present

## 2021-09-30 DIAGNOSIS — B181 Chronic viral hepatitis B without delta-agent: Secondary | ICD-10-CM | POA: Diagnosis not present

## 2021-09-30 DIAGNOSIS — E876 Hypokalemia: Secondary | ICD-10-CM | POA: Diagnosis not present

## 2021-09-30 DIAGNOSIS — G0481 Other encephalitis and encephalomyelitis: Secondary | ICD-10-CM | POA: Diagnosis not present

## 2021-09-30 DIAGNOSIS — R509 Fever, unspecified: Secondary | ICD-10-CM | POA: Diagnosis not present

## 2021-09-30 DIAGNOSIS — R1310 Dysphagia, unspecified: Secondary | ICD-10-CM | POA: Diagnosis not present

## 2021-09-30 DIAGNOSIS — G909 Disorder of the autonomic nervous system, unspecified: Secondary | ICD-10-CM | POA: Diagnosis not present

## 2021-09-30 DIAGNOSIS — Z930 Tracheostomy status: Secondary | ICD-10-CM | POA: Diagnosis not present

## 2021-10-01 DIAGNOSIS — R509 Fever, unspecified: Secondary | ICD-10-CM | POA: Diagnosis not present

## 2021-10-01 DIAGNOSIS — G909 Disorder of the autonomic nervous system, unspecified: Secondary | ICD-10-CM | POA: Diagnosis not present

## 2021-10-01 DIAGNOSIS — G0481 Other encephalitis and encephalomyelitis: Secondary | ICD-10-CM | POA: Diagnosis not present

## 2021-10-01 DIAGNOSIS — B181 Chronic viral hepatitis B without delta-agent: Secondary | ICD-10-CM | POA: Diagnosis not present

## 2021-10-01 DIAGNOSIS — Z930 Tracheostomy status: Secondary | ICD-10-CM | POA: Diagnosis not present

## 2021-10-01 DIAGNOSIS — E876 Hypokalemia: Secondary | ICD-10-CM | POA: Diagnosis not present

## 2021-10-01 DIAGNOSIS — R1310 Dysphagia, unspecified: Secondary | ICD-10-CM | POA: Diagnosis not present

## 2021-10-01 DIAGNOSIS — E063 Autoimmune thyroiditis: Secondary | ICD-10-CM | POA: Diagnosis not present

## 2021-10-01 DIAGNOSIS — J9600 Acute respiratory failure, unspecified whether with hypoxia or hypercapnia: Secondary | ICD-10-CM | POA: Diagnosis not present

## 2021-10-01 DIAGNOSIS — E039 Hypothyroidism, unspecified: Secondary | ICD-10-CM | POA: Diagnosis not present

## 2021-10-01 DIAGNOSIS — R569 Unspecified convulsions: Secondary | ICD-10-CM | POA: Diagnosis not present

## 2021-10-02 DIAGNOSIS — E876 Hypokalemia: Secondary | ICD-10-CM | POA: Diagnosis not present

## 2021-10-02 DIAGNOSIS — R509 Fever, unspecified: Secondary | ICD-10-CM | POA: Diagnosis not present

## 2021-10-02 DIAGNOSIS — E063 Autoimmune thyroiditis: Secondary | ICD-10-CM | POA: Diagnosis not present

## 2021-10-02 DIAGNOSIS — Z930 Tracheostomy status: Secondary | ICD-10-CM | POA: Diagnosis not present

## 2021-10-02 DIAGNOSIS — R569 Unspecified convulsions: Secondary | ICD-10-CM | POA: Diagnosis not present

## 2021-10-02 DIAGNOSIS — J9600 Acute respiratory failure, unspecified whether with hypoxia or hypercapnia: Secondary | ICD-10-CM | POA: Diagnosis not present

## 2021-10-02 DIAGNOSIS — R1310 Dysphagia, unspecified: Secondary | ICD-10-CM | POA: Diagnosis not present

## 2021-10-02 DIAGNOSIS — G0481 Other encephalitis and encephalomyelitis: Secondary | ICD-10-CM | POA: Diagnosis not present

## 2021-10-02 DIAGNOSIS — B181 Chronic viral hepatitis B without delta-agent: Secondary | ICD-10-CM | POA: Diagnosis not present

## 2021-10-02 DIAGNOSIS — G909 Disorder of the autonomic nervous system, unspecified: Secondary | ICD-10-CM | POA: Diagnosis not present

## 2021-10-02 DIAGNOSIS — E039 Hypothyroidism, unspecified: Secondary | ICD-10-CM | POA: Diagnosis not present

## 2021-10-03 DIAGNOSIS — E063 Autoimmune thyroiditis: Secondary | ICD-10-CM | POA: Diagnosis not present

## 2021-10-03 DIAGNOSIS — R1310 Dysphagia, unspecified: Secondary | ICD-10-CM | POA: Diagnosis not present

## 2021-10-03 DIAGNOSIS — R509 Fever, unspecified: Secondary | ICD-10-CM | POA: Diagnosis not present

## 2021-10-03 DIAGNOSIS — E039 Hypothyroidism, unspecified: Secondary | ICD-10-CM | POA: Diagnosis not present

## 2021-10-03 DIAGNOSIS — Z930 Tracheostomy status: Secondary | ICD-10-CM | POA: Diagnosis not present

## 2021-10-03 DIAGNOSIS — R569 Unspecified convulsions: Secondary | ICD-10-CM | POA: Diagnosis not present

## 2021-10-03 DIAGNOSIS — J9600 Acute respiratory failure, unspecified whether with hypoxia or hypercapnia: Secondary | ICD-10-CM | POA: Diagnosis not present

## 2021-10-03 DIAGNOSIS — G0481 Other encephalitis and encephalomyelitis: Secondary | ICD-10-CM | POA: Diagnosis not present

## 2021-10-03 DIAGNOSIS — E876 Hypokalemia: Secondary | ICD-10-CM | POA: Diagnosis not present

## 2021-10-03 DIAGNOSIS — B181 Chronic viral hepatitis B without delta-agent: Secondary | ICD-10-CM | POA: Diagnosis not present

## 2021-10-03 DIAGNOSIS — G909 Disorder of the autonomic nervous system, unspecified: Secondary | ICD-10-CM | POA: Diagnosis not present

## 2021-10-04 DIAGNOSIS — R1310 Dysphagia, unspecified: Secondary | ICD-10-CM | POA: Diagnosis not present

## 2021-10-04 DIAGNOSIS — J9600 Acute respiratory failure, unspecified whether with hypoxia or hypercapnia: Secondary | ICD-10-CM | POA: Diagnosis not present

## 2021-10-04 DIAGNOSIS — Z930 Tracheostomy status: Secondary | ICD-10-CM | POA: Diagnosis not present

## 2021-10-04 DIAGNOSIS — E063 Autoimmune thyroiditis: Secondary | ICD-10-CM | POA: Diagnosis not present

## 2021-10-04 DIAGNOSIS — E876 Hypokalemia: Secondary | ICD-10-CM | POA: Diagnosis not present

## 2021-10-04 DIAGNOSIS — E039 Hypothyroidism, unspecified: Secondary | ICD-10-CM | POA: Diagnosis not present

## 2021-10-04 DIAGNOSIS — B181 Chronic viral hepatitis B without delta-agent: Secondary | ICD-10-CM | POA: Diagnosis not present

## 2021-10-04 DIAGNOSIS — G0481 Other encephalitis and encephalomyelitis: Secondary | ICD-10-CM | POA: Diagnosis not present

## 2021-10-04 DIAGNOSIS — J9611 Chronic respiratory failure with hypoxia: Secondary | ICD-10-CM | POA: Diagnosis not present

## 2021-10-04 DIAGNOSIS — G909 Disorder of the autonomic nervous system, unspecified: Secondary | ICD-10-CM | POA: Diagnosis not present

## 2021-10-04 DIAGNOSIS — R569 Unspecified convulsions: Secondary | ICD-10-CM | POA: Diagnosis not present

## 2021-10-04 DIAGNOSIS — R509 Fever, unspecified: Secondary | ICD-10-CM | POA: Diagnosis not present

## 2021-10-05 DIAGNOSIS — Z930 Tracheostomy status: Secondary | ICD-10-CM | POA: Diagnosis not present

## 2021-10-05 DIAGNOSIS — F458 Other somatoform disorders: Secondary | ICD-10-CM | POA: Diagnosis not present

## 2021-10-05 DIAGNOSIS — J9600 Acute respiratory failure, unspecified whether with hypoxia or hypercapnia: Secondary | ICD-10-CM | POA: Diagnosis not present

## 2021-10-05 DIAGNOSIS — E039 Hypothyroidism, unspecified: Secondary | ICD-10-CM | POA: Diagnosis not present

## 2021-10-05 DIAGNOSIS — E876 Hypokalemia: Secondary | ICD-10-CM | POA: Diagnosis not present

## 2021-10-05 DIAGNOSIS — G0481 Other encephalitis and encephalomyelitis: Secondary | ICD-10-CM | POA: Diagnosis not present

## 2021-10-05 DIAGNOSIS — R509 Fever, unspecified: Secondary | ICD-10-CM | POA: Diagnosis not present

## 2021-10-05 DIAGNOSIS — R569 Unspecified convulsions: Secondary | ICD-10-CM | POA: Diagnosis not present

## 2021-10-06 DIAGNOSIS — J9600 Acute respiratory failure, unspecified whether with hypoxia or hypercapnia: Secondary | ICD-10-CM | POA: Diagnosis not present

## 2021-10-06 DIAGNOSIS — E039 Hypothyroidism, unspecified: Secondary | ICD-10-CM | POA: Diagnosis not present

## 2021-10-06 DIAGNOSIS — E063 Autoimmune thyroiditis: Secondary | ICD-10-CM | POA: Diagnosis not present

## 2021-10-06 DIAGNOSIS — R569 Unspecified convulsions: Secondary | ICD-10-CM | POA: Diagnosis not present

## 2021-10-06 DIAGNOSIS — E876 Hypokalemia: Secondary | ICD-10-CM | POA: Diagnosis not present

## 2021-10-06 DIAGNOSIS — F458 Other somatoform disorders: Secondary | ICD-10-CM | POA: Diagnosis not present

## 2021-10-06 DIAGNOSIS — G0481 Other encephalitis and encephalomyelitis: Secondary | ICD-10-CM | POA: Diagnosis not present

## 2021-10-07 DIAGNOSIS — G0481 Other encephalitis and encephalomyelitis: Secondary | ICD-10-CM | POA: Diagnosis not present

## 2021-10-07 DIAGNOSIS — J9600 Acute respiratory failure, unspecified whether with hypoxia or hypercapnia: Secondary | ICD-10-CM | POA: Diagnosis not present

## 2021-10-07 DIAGNOSIS — E063 Autoimmune thyroiditis: Secondary | ICD-10-CM | POA: Diagnosis not present

## 2021-10-07 DIAGNOSIS — R569 Unspecified convulsions: Secondary | ICD-10-CM | POA: Diagnosis not present

## 2021-10-07 DIAGNOSIS — E876 Hypokalemia: Secondary | ICD-10-CM | POA: Diagnosis not present

## 2021-10-07 DIAGNOSIS — F458 Other somatoform disorders: Secondary | ICD-10-CM | POA: Diagnosis not present

## 2021-10-07 DIAGNOSIS — E039 Hypothyroidism, unspecified: Secondary | ICD-10-CM | POA: Diagnosis not present

## 2021-10-08 DIAGNOSIS — J9600 Acute respiratory failure, unspecified whether with hypoxia or hypercapnia: Secondary | ICD-10-CM | POA: Diagnosis not present

## 2021-10-08 DIAGNOSIS — F458 Other somatoform disorders: Secondary | ICD-10-CM | POA: Diagnosis not present

## 2021-10-08 DIAGNOSIS — E876 Hypokalemia: Secondary | ICD-10-CM | POA: Diagnosis not present

## 2021-10-08 DIAGNOSIS — R569 Unspecified convulsions: Secondary | ICD-10-CM | POA: Diagnosis not present

## 2021-10-08 DIAGNOSIS — E063 Autoimmune thyroiditis: Secondary | ICD-10-CM | POA: Diagnosis not present

## 2021-10-08 DIAGNOSIS — E039 Hypothyroidism, unspecified: Secondary | ICD-10-CM | POA: Diagnosis not present

## 2021-10-08 DIAGNOSIS — G0481 Other encephalitis and encephalomyelitis: Secondary | ICD-10-CM | POA: Diagnosis not present

## 2021-10-09 DIAGNOSIS — G0481 Other encephalitis and encephalomyelitis: Secondary | ICD-10-CM | POA: Diagnosis not present

## 2021-10-09 DIAGNOSIS — F458 Other somatoform disorders: Secondary | ICD-10-CM | POA: Diagnosis not present

## 2021-10-09 DIAGNOSIS — R569 Unspecified convulsions: Secondary | ICD-10-CM | POA: Diagnosis not present

## 2021-10-09 DIAGNOSIS — E876 Hypokalemia: Secondary | ICD-10-CM | POA: Diagnosis not present

## 2021-10-09 DIAGNOSIS — E063 Autoimmune thyroiditis: Secondary | ICD-10-CM | POA: Diagnosis not present

## 2021-10-09 DIAGNOSIS — E039 Hypothyroidism, unspecified: Secondary | ICD-10-CM | POA: Diagnosis not present

## 2021-10-09 DIAGNOSIS — J9600 Acute respiratory failure, unspecified whether with hypoxia or hypercapnia: Secondary | ICD-10-CM | POA: Diagnosis not present

## 2021-10-10 DIAGNOSIS — J9600 Acute respiratory failure, unspecified whether with hypoxia or hypercapnia: Secondary | ICD-10-CM | POA: Diagnosis not present

## 2021-10-10 DIAGNOSIS — G0481 Other encephalitis and encephalomyelitis: Secondary | ICD-10-CM | POA: Diagnosis not present

## 2021-10-10 DIAGNOSIS — R569 Unspecified convulsions: Secondary | ICD-10-CM | POA: Diagnosis not present

## 2021-10-10 DIAGNOSIS — F458 Other somatoform disorders: Secondary | ICD-10-CM | POA: Diagnosis not present

## 2021-10-10 DIAGNOSIS — E876 Hypokalemia: Secondary | ICD-10-CM | POA: Diagnosis not present

## 2021-10-10 DIAGNOSIS — E063 Autoimmune thyroiditis: Secondary | ICD-10-CM | POA: Diagnosis not present

## 2021-10-10 DIAGNOSIS — E039 Hypothyroidism, unspecified: Secondary | ICD-10-CM | POA: Diagnosis not present

## 2021-10-11 DIAGNOSIS — R569 Unspecified convulsions: Secondary | ICD-10-CM | POA: Diagnosis not present

## 2021-10-11 DIAGNOSIS — E039 Hypothyroidism, unspecified: Secondary | ICD-10-CM | POA: Diagnosis not present

## 2021-10-11 DIAGNOSIS — J9600 Acute respiratory failure, unspecified whether with hypoxia or hypercapnia: Secondary | ICD-10-CM | POA: Diagnosis not present

## 2021-10-11 DIAGNOSIS — E876 Hypokalemia: Secondary | ICD-10-CM | POA: Diagnosis not present

## 2021-10-11 DIAGNOSIS — F458 Other somatoform disorders: Secondary | ICD-10-CM | POA: Diagnosis not present

## 2021-10-11 DIAGNOSIS — E063 Autoimmune thyroiditis: Secondary | ICD-10-CM | POA: Diagnosis not present

## 2021-10-11 DIAGNOSIS — G0481 Other encephalitis and encephalomyelitis: Secondary | ICD-10-CM | POA: Diagnosis not present

## 2021-10-12 DIAGNOSIS — J9601 Acute respiratory failure with hypoxia: Secondary | ICD-10-CM | POA: Diagnosis not present

## 2021-10-16 DIAGNOSIS — E063 Autoimmune thyroiditis: Secondary | ICD-10-CM | POA: Diagnosis not present

## 2021-10-16 DIAGNOSIS — J9600 Acute respiratory failure, unspecified whether with hypoxia or hypercapnia: Secondary | ICD-10-CM | POA: Diagnosis not present

## 2021-10-16 DIAGNOSIS — R569 Unspecified convulsions: Secondary | ICD-10-CM | POA: Diagnosis not present

## 2021-10-16 DIAGNOSIS — G0481 Other encephalitis and encephalomyelitis: Secondary | ICD-10-CM | POA: Diagnosis not present

## 2021-10-16 DIAGNOSIS — F458 Other somatoform disorders: Secondary | ICD-10-CM | POA: Diagnosis not present

## 2021-10-16 DIAGNOSIS — B181 Chronic viral hepatitis B without delta-agent: Secondary | ICD-10-CM | POA: Diagnosis not present

## 2021-10-16 DIAGNOSIS — Z931 Gastrostomy status: Secondary | ICD-10-CM | POA: Diagnosis not present

## 2021-10-17 DIAGNOSIS — F458 Other somatoform disorders: Secondary | ICD-10-CM | POA: Diagnosis not present

## 2021-10-17 DIAGNOSIS — B181 Chronic viral hepatitis B without delta-agent: Secondary | ICD-10-CM | POA: Diagnosis not present

## 2021-10-17 DIAGNOSIS — R569 Unspecified convulsions: Secondary | ICD-10-CM | POA: Diagnosis not present

## 2021-10-17 DIAGNOSIS — Z931 Gastrostomy status: Secondary | ICD-10-CM | POA: Diagnosis not present

## 2021-10-17 DIAGNOSIS — G0481 Other encephalitis and encephalomyelitis: Secondary | ICD-10-CM | POA: Diagnosis not present

## 2021-10-17 DIAGNOSIS — E063 Autoimmune thyroiditis: Secondary | ICD-10-CM | POA: Diagnosis not present

## 2021-10-17 DIAGNOSIS — J9600 Acute respiratory failure, unspecified whether with hypoxia or hypercapnia: Secondary | ICD-10-CM | POA: Diagnosis not present

## 2021-10-18 DIAGNOSIS — R569 Unspecified convulsions: Secondary | ICD-10-CM | POA: Diagnosis not present

## 2021-10-18 DIAGNOSIS — J9600 Acute respiratory failure, unspecified whether with hypoxia or hypercapnia: Secondary | ICD-10-CM | POA: Diagnosis not present

## 2021-10-18 DIAGNOSIS — Z931 Gastrostomy status: Secondary | ICD-10-CM | POA: Diagnosis not present

## 2021-10-18 DIAGNOSIS — F458 Other somatoform disorders: Secondary | ICD-10-CM | POA: Diagnosis not present

## 2021-10-18 DIAGNOSIS — B181 Chronic viral hepatitis B without delta-agent: Secondary | ICD-10-CM | POA: Diagnosis not present

## 2021-10-18 DIAGNOSIS — E063 Autoimmune thyroiditis: Secondary | ICD-10-CM | POA: Diagnosis not present

## 2021-10-18 DIAGNOSIS — G0481 Other encephalitis and encephalomyelitis: Secondary | ICD-10-CM | POA: Diagnosis not present

## 2021-10-19 DIAGNOSIS — E063 Autoimmune thyroiditis: Secondary | ICD-10-CM | POA: Diagnosis not present

## 2021-10-19 DIAGNOSIS — G0481 Other encephalitis and encephalomyelitis: Secondary | ICD-10-CM | POA: Diagnosis not present

## 2021-10-19 DIAGNOSIS — Z930 Tracheostomy status: Secondary | ICD-10-CM | POA: Diagnosis not present

## 2021-10-19 DIAGNOSIS — J9601 Acute respiratory failure with hypoxia: Secondary | ICD-10-CM | POA: Diagnosis not present

## 2021-10-19 DIAGNOSIS — B1910 Unspecified viral hepatitis B without hepatic coma: Secondary | ICD-10-CM | POA: Diagnosis not present

## 2021-10-19 DIAGNOSIS — R1310 Dysphagia, unspecified: Secondary | ICD-10-CM | POA: Diagnosis not present

## 2021-10-20 DIAGNOSIS — R569 Unspecified convulsions: Secondary | ICD-10-CM | POA: Diagnosis not present

## 2021-10-20 DIAGNOSIS — J9600 Acute respiratory failure, unspecified whether with hypoxia or hypercapnia: Secondary | ICD-10-CM | POA: Diagnosis not present

## 2021-10-20 DIAGNOSIS — E039 Hypothyroidism, unspecified: Secondary | ICD-10-CM | POA: Diagnosis not present

## 2021-10-20 DIAGNOSIS — Z930 Tracheostomy status: Secondary | ICD-10-CM | POA: Diagnosis not present

## 2021-10-20 DIAGNOSIS — E063 Autoimmune thyroiditis: Secondary | ICD-10-CM | POA: Diagnosis not present

## 2021-10-20 DIAGNOSIS — G9349 Other encephalopathy: Secondary | ICD-10-CM | POA: Diagnosis not present

## 2021-10-20 DIAGNOSIS — B181 Chronic viral hepatitis B without delta-agent: Secondary | ICD-10-CM | POA: Diagnosis not present

## 2021-10-20 DIAGNOSIS — G0481 Other encephalitis and encephalomyelitis: Secondary | ICD-10-CM | POA: Diagnosis not present

## 2021-10-20 DIAGNOSIS — R1310 Dysphagia, unspecified: Secondary | ICD-10-CM | POA: Diagnosis not present

## 2021-10-21 DIAGNOSIS — J9620 Acute and chronic respiratory failure, unspecified whether with hypoxia or hypercapnia: Secondary | ICD-10-CM | POA: Diagnosis not present

## 2021-10-21 DIAGNOSIS — R569 Unspecified convulsions: Secondary | ICD-10-CM | POA: Diagnosis not present

## 2021-10-21 DIAGNOSIS — Z930 Tracheostomy status: Secondary | ICD-10-CM | POA: Diagnosis not present

## 2021-10-21 DIAGNOSIS — B181 Chronic viral hepatitis B without delta-agent: Secondary | ICD-10-CM | POA: Diagnosis not present

## 2021-10-21 DIAGNOSIS — G9349 Other encephalopathy: Secondary | ICD-10-CM | POA: Diagnosis not present

## 2021-10-21 DIAGNOSIS — E063 Autoimmune thyroiditis: Secondary | ICD-10-CM | POA: Diagnosis not present

## 2021-10-21 DIAGNOSIS — R1310 Dysphagia, unspecified: Secondary | ICD-10-CM | POA: Diagnosis not present

## 2021-10-21 DIAGNOSIS — G0481 Other encephalitis and encephalomyelitis: Secondary | ICD-10-CM | POA: Diagnosis not present

## 2021-10-21 DIAGNOSIS — E039 Hypothyroidism, unspecified: Secondary | ICD-10-CM | POA: Diagnosis not present

## 2021-10-22 DIAGNOSIS — B181 Chronic viral hepatitis B without delta-agent: Secondary | ICD-10-CM | POA: Diagnosis not present

## 2021-10-22 DIAGNOSIS — G8250 Quadriplegia, unspecified: Secondary | ICD-10-CM | POA: Diagnosis not present

## 2021-10-22 DIAGNOSIS — J9600 Acute respiratory failure, unspecified whether with hypoxia or hypercapnia: Secondary | ICD-10-CM | POA: Diagnosis not present

## 2021-10-22 DIAGNOSIS — E063 Autoimmune thyroiditis: Secondary | ICD-10-CM | POA: Diagnosis not present

## 2021-10-22 DIAGNOSIS — R1310 Dysphagia, unspecified: Secondary | ICD-10-CM | POA: Diagnosis not present

## 2021-10-22 DIAGNOSIS — G0481 Other encephalitis and encephalomyelitis: Secondary | ICD-10-CM | POA: Diagnosis not present

## 2021-10-22 DIAGNOSIS — E039 Hypothyroidism, unspecified: Secondary | ICD-10-CM | POA: Diagnosis not present

## 2021-10-22 DIAGNOSIS — D509 Iron deficiency anemia, unspecified: Secondary | ICD-10-CM | POA: Diagnosis not present

## 2021-10-22 DIAGNOSIS — J9611 Chronic respiratory failure with hypoxia: Secondary | ICD-10-CM | POA: Diagnosis not present

## 2021-10-22 DIAGNOSIS — R569 Unspecified convulsions: Secondary | ICD-10-CM | POA: Diagnosis not present

## 2021-10-23 DIAGNOSIS — B181 Chronic viral hepatitis B without delta-agent: Secondary | ICD-10-CM | POA: Diagnosis not present

## 2021-10-23 DIAGNOSIS — E063 Autoimmune thyroiditis: Secondary | ICD-10-CM | POA: Diagnosis not present

## 2021-10-23 DIAGNOSIS — E039 Hypothyroidism, unspecified: Secondary | ICD-10-CM | POA: Diagnosis not present

## 2021-10-23 DIAGNOSIS — R1310 Dysphagia, unspecified: Secondary | ICD-10-CM | POA: Diagnosis not present

## 2021-10-23 DIAGNOSIS — D509 Iron deficiency anemia, unspecified: Secondary | ICD-10-CM | POA: Diagnosis not present

## 2021-10-23 DIAGNOSIS — G8250 Quadriplegia, unspecified: Secondary | ICD-10-CM | POA: Diagnosis not present

## 2021-10-23 DIAGNOSIS — J9600 Acute respiratory failure, unspecified whether with hypoxia or hypercapnia: Secondary | ICD-10-CM | POA: Diagnosis not present

## 2021-10-23 DIAGNOSIS — J9611 Chronic respiratory failure with hypoxia: Secondary | ICD-10-CM | POA: Diagnosis not present

## 2021-10-23 DIAGNOSIS — G0481 Other encephalitis and encephalomyelitis: Secondary | ICD-10-CM | POA: Diagnosis not present

## 2021-10-23 DIAGNOSIS — R569 Unspecified convulsions: Secondary | ICD-10-CM | POA: Diagnosis not present

## 2021-10-24 DIAGNOSIS — R1310 Dysphagia, unspecified: Secondary | ICD-10-CM | POA: Diagnosis not present

## 2021-10-24 DIAGNOSIS — E039 Hypothyroidism, unspecified: Secondary | ICD-10-CM | POA: Diagnosis not present

## 2021-10-24 DIAGNOSIS — J9600 Acute respiratory failure, unspecified whether with hypoxia or hypercapnia: Secondary | ICD-10-CM | POA: Diagnosis not present

## 2021-10-24 DIAGNOSIS — R569 Unspecified convulsions: Secondary | ICD-10-CM | POA: Diagnosis not present

## 2021-10-24 DIAGNOSIS — E063 Autoimmune thyroiditis: Secondary | ICD-10-CM | POA: Diagnosis not present

## 2021-10-24 DIAGNOSIS — G8250 Quadriplegia, unspecified: Secondary | ICD-10-CM | POA: Diagnosis not present

## 2021-10-24 DIAGNOSIS — D509 Iron deficiency anemia, unspecified: Secondary | ICD-10-CM | POA: Diagnosis not present

## 2021-10-24 DIAGNOSIS — J9611 Chronic respiratory failure with hypoxia: Secondary | ICD-10-CM | POA: Diagnosis not present

## 2021-10-24 DIAGNOSIS — B181 Chronic viral hepatitis B without delta-agent: Secondary | ICD-10-CM | POA: Diagnosis not present

## 2021-10-24 DIAGNOSIS — G0481 Other encephalitis and encephalomyelitis: Secondary | ICD-10-CM | POA: Diagnosis not present

## 2021-10-25 DIAGNOSIS — E063 Autoimmune thyroiditis: Secondary | ICD-10-CM | POA: Diagnosis not present

## 2021-10-25 DIAGNOSIS — R1310 Dysphagia, unspecified: Secondary | ICD-10-CM | POA: Diagnosis not present

## 2021-10-25 DIAGNOSIS — G8250 Quadriplegia, unspecified: Secondary | ICD-10-CM | POA: Diagnosis not present

## 2021-10-25 DIAGNOSIS — R569 Unspecified convulsions: Secondary | ICD-10-CM | POA: Diagnosis not present

## 2021-10-25 DIAGNOSIS — G0481 Other encephalitis and encephalomyelitis: Secondary | ICD-10-CM | POA: Diagnosis not present

## 2021-10-25 DIAGNOSIS — E039 Hypothyroidism, unspecified: Secondary | ICD-10-CM | POA: Diagnosis not present

## 2021-10-25 DIAGNOSIS — B181 Chronic viral hepatitis B without delta-agent: Secondary | ICD-10-CM | POA: Diagnosis not present

## 2021-10-25 DIAGNOSIS — J9611 Chronic respiratory failure with hypoxia: Secondary | ICD-10-CM | POA: Diagnosis not present

## 2021-10-25 DIAGNOSIS — J9600 Acute respiratory failure, unspecified whether with hypoxia or hypercapnia: Secondary | ICD-10-CM | POA: Diagnosis not present

## 2021-10-25 DIAGNOSIS — D509 Iron deficiency anemia, unspecified: Secondary | ICD-10-CM | POA: Diagnosis not present

## 2021-10-26 DIAGNOSIS — J189 Pneumonia, unspecified organism: Secondary | ICD-10-CM | POA: Diagnosis not present

## 2021-10-26 DIAGNOSIS — E039 Hypothyroidism, unspecified: Secondary | ICD-10-CM | POA: Diagnosis not present

## 2021-10-26 DIAGNOSIS — G9340 Encephalopathy, unspecified: Secondary | ICD-10-CM | POA: Diagnosis not present

## 2021-10-26 DIAGNOSIS — J9611 Chronic respiratory failure with hypoxia: Secondary | ICD-10-CM | POA: Diagnosis not present

## 2021-10-26 DIAGNOSIS — G40409 Other generalized epilepsy and epileptic syndromes, not intractable, without status epilepticus: Secondary | ICD-10-CM | POA: Diagnosis not present

## 2021-10-26 DIAGNOSIS — G249 Dystonia, unspecified: Secondary | ICD-10-CM | POA: Diagnosis not present

## 2021-10-26 DIAGNOSIS — G9349 Other encephalopathy: Secondary | ICD-10-CM | POA: Diagnosis not present

## 2021-10-26 DIAGNOSIS — J9509 Other tracheostomy complication: Secondary | ICD-10-CM | POA: Diagnosis not present

## 2021-10-26 DIAGNOSIS — G0481 Other encephalitis and encephalomyelitis: Secondary | ICD-10-CM | POA: Diagnosis not present

## 2021-10-26 DIAGNOSIS — D509 Iron deficiency anemia, unspecified: Secondary | ICD-10-CM | POA: Diagnosis not present

## 2021-10-26 DIAGNOSIS — J9503 Malfunction of tracheostomy stoma: Secondary | ICD-10-CM | POA: Diagnosis not present

## 2021-10-26 DIAGNOSIS — B1910 Unspecified viral hepatitis B without hepatic coma: Secondary | ICD-10-CM | POA: Diagnosis not present

## 2021-10-27 DIAGNOSIS — G9349 Other encephalopathy: Secondary | ICD-10-CM | POA: Diagnosis not present

## 2021-10-28 DIAGNOSIS — G9349 Other encephalopathy: Secondary | ICD-10-CM | POA: Diagnosis not present

## 2021-10-29 DIAGNOSIS — G9349 Other encephalopathy: Secondary | ICD-10-CM | POA: Diagnosis not present

## 2021-10-30 DIAGNOSIS — G9349 Other encephalopathy: Secondary | ICD-10-CM | POA: Diagnosis not present

## 2021-10-31 DIAGNOSIS — G9349 Other encephalopathy: Secondary | ICD-10-CM | POA: Diagnosis not present

## 2021-11-01 DIAGNOSIS — G9349 Other encephalopathy: Secondary | ICD-10-CM | POA: Diagnosis not present

## 2021-11-02 DIAGNOSIS — G9349 Other encephalopathy: Secondary | ICD-10-CM | POA: Diagnosis not present

## 2021-11-03 DIAGNOSIS — G40909 Epilepsy, unspecified, not intractable, without status epilepticus: Secondary | ICD-10-CM | POA: Diagnosis not present

## 2021-11-03 DIAGNOSIS — G9349 Other encephalopathy: Secondary | ICD-10-CM | POA: Diagnosis not present

## 2021-11-03 DIAGNOSIS — D649 Anemia, unspecified: Secondary | ICD-10-CM | POA: Diagnosis not present

## 2021-11-03 DIAGNOSIS — Z79899 Other long term (current) drug therapy: Secondary | ICD-10-CM | POA: Diagnosis not present

## 2021-11-03 DIAGNOSIS — E039 Hypothyroidism, unspecified: Secondary | ICD-10-CM | POA: Diagnosis not present

## 2021-11-03 DIAGNOSIS — J9611 Chronic respiratory failure with hypoxia: Secondary | ICD-10-CM | POA: Diagnosis not present

## 2021-11-03 DIAGNOSIS — J9509 Other tracheostomy complication: Secondary | ICD-10-CM | POA: Diagnosis not present

## 2021-11-03 DIAGNOSIS — G0481 Other encephalitis and encephalomyelitis: Secondary | ICD-10-CM | POA: Diagnosis not present

## 2021-11-03 DIAGNOSIS — Z79890 Hormone replacement therapy (postmenopausal): Secondary | ICD-10-CM | POA: Diagnosis not present

## 2021-11-04 DIAGNOSIS — G9349 Other encephalopathy: Secondary | ICD-10-CM | POA: Diagnosis not present

## 2021-11-04 DIAGNOSIS — G9340 Encephalopathy, unspecified: Secondary | ICD-10-CM | POA: Diagnosis not present

## 2021-11-04 DIAGNOSIS — Z931 Gastrostomy status: Secondary | ICD-10-CM | POA: Diagnosis not present

## 2021-11-05 DIAGNOSIS — G9349 Other encephalopathy: Secondary | ICD-10-CM | POA: Diagnosis not present

## 2021-11-05 DIAGNOSIS — G9340 Encephalopathy, unspecified: Secondary | ICD-10-CM | POA: Diagnosis not present

## 2021-11-05 DIAGNOSIS — Z931 Gastrostomy status: Secondary | ICD-10-CM | POA: Diagnosis not present

## 2021-11-06 DIAGNOSIS — G9349 Other encephalopathy: Secondary | ICD-10-CM | POA: Diagnosis not present

## 2021-11-07 DIAGNOSIS — G9349 Other encephalopathy: Secondary | ICD-10-CM | POA: Diagnosis not present

## 2021-11-08 DIAGNOSIS — G9349 Other encephalopathy: Secondary | ICD-10-CM | POA: Diagnosis not present

## 2021-11-09 DIAGNOSIS — G9349 Other encephalopathy: Secondary | ICD-10-CM | POA: Diagnosis not present

## 2021-11-10 DIAGNOSIS — G9349 Other encephalopathy: Secondary | ICD-10-CM | POA: Diagnosis not present

## 2021-11-11 ENCOUNTER — Telehealth: Payer: Self-pay | Admitting: Emergency Medicine

## 2021-11-11 DIAGNOSIS — R569 Unspecified convulsions: Secondary | ICD-10-CM | POA: Diagnosis not present

## 2021-11-11 DIAGNOSIS — G9349 Other encephalopathy: Secondary | ICD-10-CM | POA: Diagnosis not present

## 2021-11-11 DIAGNOSIS — R251 Tremor, unspecified: Secondary | ICD-10-CM | POA: Diagnosis not present

## 2021-11-11 NOTE — Telephone Encounter (Signed)
Copied from [place] [id number]. Topic: General - Other >> Nov 10, 2021  5:29 PM Chapman Fitch wrote: Reason for CRM:Pts mom called to see if pt can establish but she also asked if there is a program that providers see pts at home if they are homebound or cant walk / please advise

## 2021-11-12 DIAGNOSIS — G40909 Epilepsy, unspecified, not intractable, without status epilepticus: Secondary | ICD-10-CM | POA: Diagnosis not present

## 2021-11-12 DIAGNOSIS — Z930 Tracheostomy status: Secondary | ICD-10-CM | POA: Diagnosis not present

## 2021-11-12 DIAGNOSIS — J9611 Chronic respiratory failure with hypoxia: Secondary | ICD-10-CM | POA: Diagnosis not present

## 2021-11-12 DIAGNOSIS — E063 Autoimmune thyroiditis: Secondary | ICD-10-CM | POA: Diagnosis not present

## 2021-11-12 DIAGNOSIS — G9349 Other encephalopathy: Secondary | ICD-10-CM | POA: Diagnosis not present

## 2021-11-12 DIAGNOSIS — R569 Unspecified convulsions: Secondary | ICD-10-CM | POA: Diagnosis not present

## 2021-11-12 DIAGNOSIS — E038 Other specified hypothyroidism: Secondary | ICD-10-CM | POA: Diagnosis not present

## 2021-11-12 DIAGNOSIS — G0481 Other encephalitis and encephalomyelitis: Secondary | ICD-10-CM | POA: Diagnosis not present

## 2021-11-13 DIAGNOSIS — Z930 Tracheostomy status: Secondary | ICD-10-CM | POA: Diagnosis not present

## 2021-11-13 DIAGNOSIS — R251 Tremor, unspecified: Secondary | ICD-10-CM | POA: Diagnosis not present

## 2021-11-13 DIAGNOSIS — E038 Other specified hypothyroidism: Secondary | ICD-10-CM | POA: Diagnosis not present

## 2021-11-13 DIAGNOSIS — E039 Hypothyroidism, unspecified: Secondary | ICD-10-CM | POA: Diagnosis not present

## 2021-11-13 DIAGNOSIS — E063 Autoimmune thyroiditis: Secondary | ICD-10-CM | POA: Diagnosis not present

## 2021-11-13 DIAGNOSIS — R569 Unspecified convulsions: Secondary | ICD-10-CM | POA: Diagnosis not present

## 2021-11-13 DIAGNOSIS — J9611 Chronic respiratory failure with hypoxia: Secondary | ICD-10-CM | POA: Diagnosis not present

## 2021-11-13 DIAGNOSIS — G9340 Encephalopathy, unspecified: Secondary | ICD-10-CM | POA: Diagnosis not present

## 2021-11-13 DIAGNOSIS — R509 Fever, unspecified: Secondary | ICD-10-CM | POA: Diagnosis not present

## 2021-11-13 DIAGNOSIS — G9349 Other encephalopathy: Secondary | ICD-10-CM | POA: Diagnosis not present

## 2021-11-14 DIAGNOSIS — Z930 Tracheostomy status: Secondary | ICD-10-CM | POA: Diagnosis not present

## 2021-11-14 DIAGNOSIS — E039 Hypothyroidism, unspecified: Secondary | ICD-10-CM | POA: Diagnosis not present

## 2021-11-14 DIAGNOSIS — G9349 Other encephalopathy: Secondary | ICD-10-CM | POA: Diagnosis not present

## 2021-11-14 DIAGNOSIS — J9611 Chronic respiratory failure with hypoxia: Secondary | ICD-10-CM | POA: Diagnosis not present

## 2021-11-14 DIAGNOSIS — R251 Tremor, unspecified: Secondary | ICD-10-CM | POA: Diagnosis not present

## 2021-11-15 DIAGNOSIS — R251 Tremor, unspecified: Secondary | ICD-10-CM | POA: Diagnosis not present

## 2021-11-15 DIAGNOSIS — E039 Hypothyroidism, unspecified: Secondary | ICD-10-CM | POA: Diagnosis not present

## 2021-11-15 DIAGNOSIS — Z930 Tracheostomy status: Secondary | ICD-10-CM | POA: Diagnosis not present

## 2021-11-15 DIAGNOSIS — G9349 Other encephalopathy: Secondary | ICD-10-CM | POA: Diagnosis not present

## 2021-11-15 DIAGNOSIS — J9611 Chronic respiratory failure with hypoxia: Secondary | ICD-10-CM | POA: Diagnosis not present

## 2021-11-16 DIAGNOSIS — G0481 Other encephalitis and encephalomyelitis: Secondary | ICD-10-CM | POA: Diagnosis not present

## 2021-11-16 DIAGNOSIS — G9340 Encephalopathy, unspecified: Secondary | ICD-10-CM | POA: Diagnosis not present

## 2021-11-16 DIAGNOSIS — Z931 Gastrostomy status: Secondary | ICD-10-CM | POA: Diagnosis not present

## 2021-11-16 DIAGNOSIS — R509 Fever, unspecified: Secondary | ICD-10-CM | POA: Diagnosis not present

## 2021-11-17 DIAGNOSIS — G0481 Other encephalitis and encephalomyelitis: Secondary | ICD-10-CM | POA: Diagnosis not present

## 2021-11-18 DIAGNOSIS — R32 Unspecified urinary incontinence: Secondary | ICD-10-CM | POA: Diagnosis not present

## 2021-11-18 DIAGNOSIS — R159 Full incontinence of feces: Secondary | ICD-10-CM | POA: Diagnosis not present

## 2021-11-18 DIAGNOSIS — G0481 Other encephalitis and encephalomyelitis: Secondary | ICD-10-CM | POA: Diagnosis not present

## 2021-11-18 DIAGNOSIS — R0602 Shortness of breath: Secondary | ICD-10-CM | POA: Diagnosis not present

## 2021-11-18 NOTE — Telephone Encounter (Signed)
Stated to patient same message as left before on voicemail.   Given phone number to [facility] via [employer] resource [phone]

## 2021-11-18 NOTE — Telephone Encounter (Signed)
Not that I am aware of.  [facility] maybe. Will forward message to RN,CM.  Left message of voicemail to f/u with [facility] and that we would be in touch with patient next week regarding question.

## 2021-11-18 NOTE — Telephone Encounter (Addendum)
Pts returned call/ advised of [facility] program info and that [name] will be in touch back next week regarding question / please advise   Pt asked if [name] can please call her back today

## 2021-11-18 NOTE — Telephone Encounter (Signed)
FYI

## 2021-11-19 DIAGNOSIS — G0481 Other encephalitis and encephalomyelitis: Secondary | ICD-10-CM | POA: Diagnosis not present

## 2021-11-19 DIAGNOSIS — G9340 Encephalopathy, unspecified: Secondary | ICD-10-CM | POA: Diagnosis not present

## 2021-11-19 DIAGNOSIS — Z931 Gastrostomy status: Secondary | ICD-10-CM | POA: Diagnosis not present

## 2021-11-20 DIAGNOSIS — G0481 Other encephalitis and encephalomyelitis: Secondary | ICD-10-CM | POA: Diagnosis not present

## 2021-11-20 DIAGNOSIS — K5900 Constipation, unspecified: Secondary | ICD-10-CM | POA: Diagnosis not present

## 2021-11-21 DIAGNOSIS — G0481 Other encephalitis and encephalomyelitis: Secondary | ICD-10-CM | POA: Diagnosis not present

## 2021-11-22 DIAGNOSIS — G9340 Encephalopathy, unspecified: Secondary | ICD-10-CM | POA: Diagnosis not present

## 2021-11-22 DIAGNOSIS — Z931 Gastrostomy status: Secondary | ICD-10-CM | POA: Diagnosis not present

## 2021-11-22 DIAGNOSIS — G0481 Other encephalitis and encephalomyelitis: Secondary | ICD-10-CM | POA: Diagnosis not present

## 2021-11-23 DIAGNOSIS — J9611 Chronic respiratory failure with hypoxia: Secondary | ICD-10-CM | POA: Diagnosis not present

## 2021-11-24 DIAGNOSIS — J9611 Chronic respiratory failure with hypoxia: Secondary | ICD-10-CM | POA: Diagnosis not present

## 2021-11-25 DIAGNOSIS — J9611 Chronic respiratory failure with hypoxia: Secondary | ICD-10-CM | POA: Diagnosis not present

## 2021-11-25 DIAGNOSIS — Z930 Tracheostomy status: Secondary | ICD-10-CM | POA: Diagnosis not present

## 2021-11-25 NOTE — Telephone Encounter (Signed)
Call returned to patient's mother, [place] # [phone] , message left with call back requested to this CM.  I would like to instruct her to call the patient's insurance company and inquire if they have providers who make home visits. She can also check to see if they offer case management services to help connect her with other resources that may assist.

## 2021-11-26 DIAGNOSIS — Z7901 Long term (current) use of anticoagulants: Secondary | ICD-10-CM | POA: Diagnosis not present

## 2021-11-26 DIAGNOSIS — L539 Erythematous condition, unspecified: Secondary | ICD-10-CM | POA: Diagnosis not present

## 2021-11-26 DIAGNOSIS — G0481 Other encephalitis and encephalomyelitis: Secondary | ICD-10-CM | POA: Diagnosis not present

## 2021-11-26 DIAGNOSIS — J9611 Chronic respiratory failure with hypoxia: Secondary | ICD-10-CM | POA: Diagnosis not present

## 2021-11-29 DIAGNOSIS — J9611 Chronic respiratory failure with hypoxia: Secondary | ICD-10-CM | POA: Diagnosis not present

## 2021-11-30 DIAGNOSIS — Z7401 Bed confinement status: Secondary | ICD-10-CM | POA: Diagnosis not present

## 2021-11-30 DIAGNOSIS — E063 Autoimmune thyroiditis: Secondary | ICD-10-CM | POA: Diagnosis not present

## 2021-11-30 DIAGNOSIS — Z931 Gastrostomy status: Secondary | ICD-10-CM | POA: Diagnosis not present

## 2021-11-30 DIAGNOSIS — M6281 Muscle weakness (generalized): Secondary | ICD-10-CM | POA: Diagnosis not present

## 2021-11-30 DIAGNOSIS — H109 Unspecified conjunctivitis: Secondary | ICD-10-CM | POA: Diagnosis not present

## 2021-11-30 DIAGNOSIS — Z930 Tracheostomy status: Secondary | ICD-10-CM | POA: Diagnosis not present

## 2021-11-30 DIAGNOSIS — J9611 Chronic respiratory failure with hypoxia: Secondary | ICD-10-CM | POA: Diagnosis not present

## 2021-12-01 DIAGNOSIS — J9611 Chronic respiratory failure with hypoxia: Secondary | ICD-10-CM | POA: Diagnosis not present

## 2021-12-02 DIAGNOSIS — J9611 Chronic respiratory failure with hypoxia: Secondary | ICD-10-CM | POA: Diagnosis not present

## 2021-12-02 DIAGNOSIS — G9340 Encephalopathy, unspecified: Secondary | ICD-10-CM | POA: Diagnosis not present

## 2021-12-02 DIAGNOSIS — Z931 Gastrostomy status: Secondary | ICD-10-CM | POA: Diagnosis not present

## 2021-12-03 DIAGNOSIS — J9611 Chronic respiratory failure with hypoxia: Secondary | ICD-10-CM | POA: Diagnosis not present

## 2021-12-06 DIAGNOSIS — G9340 Encephalopathy, unspecified: Secondary | ICD-10-CM | POA: Diagnosis not present

## 2021-12-06 DIAGNOSIS — Z931 Gastrostomy status: Secondary | ICD-10-CM | POA: Diagnosis not present

## 2021-12-07 DIAGNOSIS — J9611 Chronic respiratory failure with hypoxia: Secondary | ICD-10-CM | POA: Diagnosis not present

## 2021-12-08 DIAGNOSIS — J9611 Chronic respiratory failure with hypoxia: Secondary | ICD-10-CM | POA: Diagnosis not present

## 2021-12-09 DIAGNOSIS — J9611 Chronic respiratory failure with hypoxia: Secondary | ICD-10-CM | POA: Diagnosis not present

## 2021-12-14 DIAGNOSIS — M6281 Muscle weakness (generalized): Secondary | ICD-10-CM | POA: Diagnosis not present

## 2021-12-14 DIAGNOSIS — Z7401 Bed confinement status: Secondary | ICD-10-CM | POA: Diagnosis not present

## 2021-12-14 DIAGNOSIS — J9611 Chronic respiratory failure with hypoxia: Secondary | ICD-10-CM | POA: Diagnosis not present

## 2021-12-14 DIAGNOSIS — H109 Unspecified conjunctivitis: Secondary | ICD-10-CM | POA: Diagnosis not present

## 2021-12-17 DIAGNOSIS — Z931 Gastrostomy status: Secondary | ICD-10-CM | POA: Diagnosis not present

## 2021-12-17 DIAGNOSIS — G9340 Encephalopathy, unspecified: Secondary | ICD-10-CM | POA: Diagnosis not present

## 2021-12-21 DIAGNOSIS — J9611 Chronic respiratory failure with hypoxia: Secondary | ICD-10-CM | POA: Diagnosis not present

## 2021-12-21 DIAGNOSIS — R159 Full incontinence of feces: Secondary | ICD-10-CM | POA: Diagnosis not present

## 2021-12-21 DIAGNOSIS — G0481 Other encephalitis and encephalomyelitis: Secondary | ICD-10-CM | POA: Diagnosis not present

## 2021-12-21 DIAGNOSIS — R32 Unspecified urinary incontinence: Secondary | ICD-10-CM | POA: Diagnosis not present

## 2021-12-22 DIAGNOSIS — J9611 Chronic respiratory failure with hypoxia: Secondary | ICD-10-CM | POA: Diagnosis not present

## 2021-12-23 DIAGNOSIS — J9611 Chronic respiratory failure with hypoxia: Secondary | ICD-10-CM | POA: Diagnosis not present

## 2021-12-23 DIAGNOSIS — Z931 Gastrostomy status: Secondary | ICD-10-CM | POA: Diagnosis not present

## 2021-12-23 DIAGNOSIS — G9349 Other encephalopathy: Secondary | ICD-10-CM | POA: Diagnosis not present

## 2021-12-23 DIAGNOSIS — G9340 Encephalopathy, unspecified: Secondary | ICD-10-CM | POA: Diagnosis not present

## 2021-12-24 DIAGNOSIS — J9611 Chronic respiratory failure with hypoxia: Secondary | ICD-10-CM | POA: Diagnosis not present

## 2021-12-25 DIAGNOSIS — J9611 Chronic respiratory failure with hypoxia: Secondary | ICD-10-CM | POA: Diagnosis not present

## 2021-12-27 DIAGNOSIS — J9611 Chronic respiratory failure with hypoxia: Secondary | ICD-10-CM | POA: Diagnosis not present

## 2021-12-28 DIAGNOSIS — J9611 Chronic respiratory failure with hypoxia: Secondary | ICD-10-CM | POA: Diagnosis not present

## 2021-12-29 DIAGNOSIS — J9611 Chronic respiratory failure with hypoxia: Secondary | ICD-10-CM | POA: Diagnosis not present

## 2021-12-29 DIAGNOSIS — H109 Unspecified conjunctivitis: Secondary | ICD-10-CM | POA: Diagnosis not present

## 2021-12-29 DIAGNOSIS — Z7901 Long term (current) use of anticoagulants: Secondary | ICD-10-CM | POA: Diagnosis not present

## 2021-12-29 DIAGNOSIS — M62838 Other muscle spasm: Secondary | ICD-10-CM | POA: Diagnosis not present

## 2021-12-29 DIAGNOSIS — Z7401 Bed confinement status: Secondary | ICD-10-CM | POA: Diagnosis not present

## 2021-12-29 DIAGNOSIS — G0481 Other encephalitis and encephalomyelitis: Secondary | ICD-10-CM | POA: Diagnosis not present

## 2021-12-29 DIAGNOSIS — G40909 Epilepsy, unspecified, not intractable, without status epilepticus: Secondary | ICD-10-CM | POA: Diagnosis not present

## 2021-12-29 DIAGNOSIS — Z930 Tracheostomy status: Secondary | ICD-10-CM | POA: Diagnosis not present

## 2021-12-30 DIAGNOSIS — Z930 Tracheostomy status: Secondary | ICD-10-CM | POA: Diagnosis not present

## 2021-12-30 DIAGNOSIS — J9611 Chronic respiratory failure with hypoxia: Secondary | ICD-10-CM | POA: Diagnosis not present

## 2021-12-30 DIAGNOSIS — G9340 Encephalopathy, unspecified: Secondary | ICD-10-CM | POA: Diagnosis not present

## 2021-12-31 DIAGNOSIS — J9611 Chronic respiratory failure with hypoxia: Secondary | ICD-10-CM | POA: Diagnosis not present

## 2022-01-01 DIAGNOSIS — J9611 Chronic respiratory failure with hypoxia: Secondary | ICD-10-CM | POA: Diagnosis not present

## 2022-01-02 ENCOUNTER — Emergency Department (HOSPITAL_COMMUNITY): Payer: Medicaid Other

## 2022-01-02 ENCOUNTER — Inpatient Hospital Stay (HOSPITAL_COMMUNITY)
Admission: EM | Admit: 2022-01-02 | Discharge: 2022-01-05 | DRG: 698 | Disposition: A | Discharge status: Home / Self Care | Payer: Medicaid Other | Attending: Internal Medicine | Admitting: Internal Medicine

## 2022-01-02 ENCOUNTER — Encounter (HOSPITAL_COMMUNITY): Payer: Self-pay | Admitting: Internal Medicine

## 2022-01-02 DIAGNOSIS — Z79890 Hormone replacement therapy (postmenopausal): Secondary | ICD-10-CM

## 2022-01-02 DIAGNOSIS — E063 Autoimmune thyroiditis: Secondary | ICD-10-CM | POA: Diagnosis not present

## 2022-01-02 DIAGNOSIS — Z20822 Contact with and (suspected) exposure to covid-19: Secondary | ICD-10-CM | POA: Diagnosis present

## 2022-01-02 DIAGNOSIS — D509 Iron deficiency anemia, unspecified: Secondary | ICD-10-CM | POA: Diagnosis present

## 2022-01-02 DIAGNOSIS — Y846 Urinary catheterization as the cause of abnormal reaction of the patient, or of later complication, without mention of misadventure at the time of the procedure: Secondary | ICD-10-CM | POA: Diagnosis present

## 2022-01-02 DIAGNOSIS — Z931 Gastrostomy status: Secondary | ICD-10-CM

## 2022-01-02 DIAGNOSIS — R509 Fever, unspecified: Secondary | ICD-10-CM | POA: Diagnosis not present

## 2022-01-02 DIAGNOSIS — R059 Cough, unspecified: Secondary | ICD-10-CM | POA: Diagnosis not present

## 2022-01-02 DIAGNOSIS — E872 Acidosis, unspecified: Secondary | ICD-10-CM | POA: Diagnosis not present

## 2022-01-02 DIAGNOSIS — Z930 Tracheostomy status: Secondary | ICD-10-CM

## 2022-01-02 DIAGNOSIS — Z888 Allergy status to other drugs, medicaments and biological substances status: Secondary | ICD-10-CM

## 2022-01-02 DIAGNOSIS — K5900 Constipation, unspecified: Secondary | ICD-10-CM

## 2022-01-02 DIAGNOSIS — J9611 Chronic respiratory failure with hypoxia: Secondary | ICD-10-CM | POA: Diagnosis present

## 2022-01-02 DIAGNOSIS — N319 Neuromuscular dysfunction of bladder, unspecified: Secondary | ICD-10-CM | POA: Diagnosis present

## 2022-01-02 DIAGNOSIS — E860 Dehydration: Secondary | ICD-10-CM | POA: Diagnosis not present

## 2022-01-02 DIAGNOSIS — Z8661 Personal history of infections of the central nervous system: Secondary | ICD-10-CM

## 2022-01-02 DIAGNOSIS — G8250 Quadriplegia, unspecified: Secondary | ICD-10-CM | POA: Diagnosis not present

## 2022-01-02 DIAGNOSIS — R7989 Other specified abnormal findings of blood chemistry: Secondary | ICD-10-CM | POA: Diagnosis present

## 2022-01-02 DIAGNOSIS — H169 Unspecified keratitis: Secondary | ICD-10-CM | POA: Diagnosis not present

## 2022-01-02 DIAGNOSIS — R6520 Severe sepsis without septic shock: Secondary | ICD-10-CM | POA: Diagnosis not present

## 2022-01-02 DIAGNOSIS — G9341 Metabolic encephalopathy: Secondary | ICD-10-CM | POA: Diagnosis not present

## 2022-01-02 DIAGNOSIS — G0481 Other encephalitis and encephalomyelitis: Secondary | ICD-10-CM | POA: Diagnosis not present

## 2022-01-02 DIAGNOSIS — E876 Hypokalemia: Secondary | ICD-10-CM | POA: Diagnosis present

## 2022-01-02 DIAGNOSIS — A419 Sepsis, unspecified organism: Secondary | ICD-10-CM

## 2022-01-02 DIAGNOSIS — E039 Hypothyroidism, unspecified: Secondary | ICD-10-CM | POA: Diagnosis not present

## 2022-01-02 DIAGNOSIS — N390 Urinary tract infection, site not specified: Secondary | ICD-10-CM

## 2022-01-02 DIAGNOSIS — Z79899 Other long term (current) drug therapy: Secondary | ICD-10-CM

## 2022-01-02 DIAGNOSIS — Z8619 Personal history of other infectious and parasitic diseases: Secondary | ICD-10-CM

## 2022-01-02 DIAGNOSIS — Z87440 Personal history of urinary (tract) infections: Secondary | ICD-10-CM

## 2022-01-02 DIAGNOSIS — T83518A Infection and inflammatory reaction due to other urinary catheter, initial encounter: Secondary | ICD-10-CM | POA: Diagnosis not present

## 2022-01-02 DIAGNOSIS — E870 Hyperosmolality and hypernatremia: Secondary | ICD-10-CM | POA: Diagnosis not present

## 2022-01-02 DIAGNOSIS — M6282 Rhabdomyolysis: Secondary | ICD-10-CM | POA: Diagnosis not present

## 2022-01-02 DIAGNOSIS — G40909 Epilepsy, unspecified, not intractable, without status epilepticus: Secondary | ICD-10-CM | POA: Diagnosis not present

## 2022-01-02 DIAGNOSIS — N3000 Acute cystitis without hematuria: Secondary | ICD-10-CM | POA: Diagnosis not present

## 2022-01-02 DIAGNOSIS — N179 Acute kidney failure, unspecified: Secondary | ICD-10-CM | POA: Diagnosis not present

## 2022-01-02 DIAGNOSIS — Z7401 Bed confinement status: Secondary | ICD-10-CM

## 2022-01-02 DIAGNOSIS — R000 Tachycardia, unspecified: Secondary | ICD-10-CM | POA: Diagnosis not present

## 2022-01-02 DIAGNOSIS — R109 Unspecified abdominal pain: Secondary | ICD-10-CM | POA: Diagnosis not present

## 2022-01-02 LAB — CBC WITH DIFFERENTIAL/PLATELET
Abs Immature Granulocytes: 0.05 10*3/uL (ref 0.00–0.07)
Basophils Absolute: 0 10*3/uL (ref 0.0–0.1)
Basophils Relative: 0 %
Eosinophils Absolute: 0 10*3/uL (ref 0.0–0.5)
Eosinophils Relative: 0 %
HCT: 46.5 % — ABNORMAL HIGH (ref 36.0–46.0)
Hemoglobin: 14.3 g/dL (ref 12.0–15.0)
Immature Granulocytes: 1 %
Lymphocytes Relative: 14 %
Lymphs Abs: 1.5 10*3/uL (ref 0.7–4.0)
MCH: 25.4 pg — ABNORMAL LOW (ref 26.0–34.0)
MCHC: 30.8 g/dL (ref 30.0–36.0)
MCV: 82.7 fL (ref 80.0–100.0)
Monocytes Absolute: 1.1 10*3/uL — ABNORMAL HIGH (ref 0.1–1.0)
Monocytes Relative: 11 %
Neutro Abs: 7.7 10*3/uL (ref 1.7–7.7)
Neutrophils Relative %: 74 %
Platelets: 525 10*3/uL — ABNORMAL HIGH (ref 150–400)
RBC: 5.62 MIL/uL — ABNORMAL HIGH (ref 3.87–5.11)
RDW: 14.3 % (ref 11.5–15.5)
WBC: 10.4 10*3/uL (ref 4.0–10.5)
nRBC: 0 % (ref 0.0–0.2)

## 2022-01-02 LAB — LACTIC ACID, PLASMA
Lactic Acid, Venous: 2.5 mmol/L (ref 0.5–1.9)
Lactic Acid, Venous: 3.1 mmol/L (ref 0.5–1.9)

## 2022-01-02 LAB — URINALYSIS, ROUTINE W REFLEX MICROSCOPIC
Bilirubin Urine: NEGATIVE
Glucose, UA: NEGATIVE mg/dL
Hgb urine dipstick: NEGATIVE
Ketones, ur: 5 mg/dL — AB
Nitrite: NEGATIVE
Protein, ur: 30 mg/dL — AB
Specific Gravity, Urine: 1.031 — ABNORMAL HIGH (ref 1.005–1.030)
pH: 5 (ref 5.0–8.0)

## 2022-01-02 LAB — COMPREHENSIVE METABOLIC PANEL
ALT: 27 U/L (ref 0–44)
AST: 51 U/L — ABNORMAL HIGH (ref 15–41)
Albumin: 4.5 g/dL (ref 3.5–5.0)
Alkaline Phosphatase: 58 U/L (ref 38–126)
Anion gap: 20 — ABNORMAL HIGH (ref 5–15)
BUN: 45 mg/dL — ABNORMAL HIGH (ref 6–20)
CO2: 33 mmol/L — ABNORMAL HIGH (ref 22–32)
Calcium: 10.7 mg/dL — ABNORMAL HIGH (ref 8.9–10.3)
Chloride: 89 mmol/L — ABNORMAL LOW (ref 98–111)
Creatinine, Ser: 0.85 mg/dL (ref 0.44–1.00)
GFR, Estimated: >60 mL/min (ref >60)
Glucose, Bld: 133 mg/dL — ABNORMAL HIGH (ref 70–99)
Potassium: 2.9 mmol/L — ABNORMAL LOW (ref 3.5–5.1)
Sodium: 142 mmol/L (ref 135–145)
Total Bilirubin: 0.7 mg/dL (ref 0.3–1.2)
Total Protein: 8.1 g/dL (ref 6.5–8.1)

## 2022-01-02 LAB — LIPASE, BLOOD: Lipase: 30 U/L (ref 11–51)

## 2022-01-02 LAB — I-STAT BETA HCG BLOOD, ED (MC, WL, AP ONLY): I-stat hCG, quantitative: <5 m[IU]/mL (ref <5)

## 2022-01-02 LAB — TROPONIN I (HIGH SENSITIVITY)
Troponin I (High Sensitivity): 12 ng/L (ref <18)
Troponin I (High Sensitivity): 10 ng/L (ref <18)

## 2022-01-02 LAB — SARS CORONAVIRUS 2 BY RT PCR: SARS Coronavirus 2 by RT PCR: NEGATIVE

## 2022-01-02 MED ORDER — DOCUSATE SODIUM 50 MG/5ML PO LIQD
100.0000 mg | Freq: Two times a day (BID) | ORAL | Status: DC
Start: 2022-01-02 — End: 2022-01-03
  Administered 2022-01-02: 100 mg
  Filled 2022-01-02: qty 10

## 2022-01-02 MED ORDER — SODIUM CHLORIDE 0.9 % IV BOLUS
1000.0000 mL | Freq: Once | INTRAVENOUS | Status: AC
  Administered 2022-01-02: 1000 mL via INTRAVENOUS

## 2022-01-02 MED ORDER — TOBRAMYCIN-DEXAMETHASONE 0.3-0.1 % OP SUSP
1.0000 [drp] | Freq: Four times a day (QID) | OPHTHALMIC | Status: DC
Start: 2022-01-03 — End: 2022-01-06
  Administered 2022-01-03 – 2022-01-05 (×11): 1 [drp] via OPHTHALMIC
  Filled 2022-01-02: qty 2.5

## 2022-01-02 MED ORDER — SODIUM CHLORIDE 0.9 % IV BOLUS
500.0000 mL | Freq: Once | INTRAVENOUS | Status: AC
  Administered 2022-01-02: 500 mL via INTRAVENOUS

## 2022-01-02 MED ORDER — ACETAMINOPHEN 325 MG PO TABS
650.0000 mg | ORAL_TABLET | Freq: Four times a day (QID) | ORAL | Status: DC | PRN
  Administered 2022-01-03: 650 mg via ORAL
  Filled 2022-01-02: qty 2

## 2022-01-02 MED ORDER — CIPROFLOXACIN HCL 0.3 % OP SOLN
2.0000 [drp] | Freq: Two times a day (BID) | OPHTHALMIC | Status: DC
Start: 2022-01-02 — End: 2022-01-06
  Administered 2022-01-03 – 2022-01-05 (×5): 2 [drp] via OPHTHALMIC
  Filled 2022-01-02: qty 2.5

## 2022-01-02 MED ORDER — POLYETHYLENE GLYCOL 3350 17 G PO PACK: 17.0000 g | PACK | Freq: Every day | ORAL | Status: DC

## 2022-01-02 MED ORDER — ONDANSETRON HCL 4 MG/2ML IJ SOLN: 4.0000 mg | Freq: Four times a day (QID) | INTRAMUSCULAR | Status: DC | PRN

## 2022-01-02 MED ORDER — NALOXONE HCL 0.4 MG/ML IJ SOLN: 0.4000 mg | INTRAMUSCULAR | Status: DC | PRN

## 2022-01-02 MED ORDER — LEVETIRACETAM IN NACL 1500 MG/100ML IV SOLN
1500.0000 mg | Freq: Two times a day (BID) | INTRAVENOUS | Status: DC
Start: 2022-01-02 — End: 2022-01-03
  Administered 2022-01-02 – 2022-01-03 (×2): 1500 mg via INTRAVENOUS
  Filled 2022-01-02 (×3): qty 100

## 2022-01-02 MED ORDER — ENOXAPARIN SODIUM 40 MG/0.4ML IJ SOSY
40.0000 mg | PREFILLED_SYRINGE | INTRAMUSCULAR | Status: DC
  Administered 2022-01-03 – 2022-01-05 (×3): 40 mg via SUBCUTANEOUS
  Filled 2022-01-02 (×3): qty 0.4

## 2022-01-02 MED ORDER — TENOFOVIR DISOPROXIL FUMARATE 300 MG PO TABS
300.0000 mg | ORAL_TABLET | Freq: Every day | ORAL | Status: DC
Start: 2022-01-03 — End: 2022-01-06
  Administered 2022-01-03 – 2022-01-05 (×3): 300 mg
  Filled 2022-01-02 (×3): qty 1

## 2022-01-02 MED ORDER — FENTANYL CITRATE PF 50 MCG/ML IJ SOSY
50.0000 ug | PREFILLED_SYRINGE | Freq: Once | INTRAMUSCULAR | Status: AC
  Administered 2022-01-02: 50 ug via INTRAVENOUS
  Filled 2022-01-02: qty 1

## 2022-01-02 MED ORDER — SODIUM CHLORIDE 0.9 % IV SOLN
1.0000 g | INTRAVENOUS | Status: DC
  Administered 2022-01-02 – 2022-01-05 (×4): 1 g via INTRAVENOUS
  Filled 2022-01-02 (×4): qty 10

## 2022-01-02 MED ORDER — FENTANYL CITRATE PF 50 MCG/ML IJ SOSY: 25.0000 ug | PREFILLED_SYRINGE | INTRAMUSCULAR | Status: DC | PRN

## 2022-01-02 MED ORDER — LACTATED RINGERS IV SOLN: INTRAVENOUS | Status: DC

## 2022-01-02 MED ORDER — IOHEXOL 300 MG/ML  SOLN
100.0000 mL | Freq: Once | INTRAMUSCULAR | Status: AC | PRN
Start: 2022-01-02 — End: 2022-01-02
  Administered 2022-01-02: 100 mL via INTRAVENOUS

## 2022-01-02 MED ORDER — ACETAMINOPHEN 650 MG RE SUPP
650.0000 mg | Freq: Four times a day (QID) | RECTAL | Status: DC | PRN
  Administered 2022-01-02 – 2022-01-03 (×2): 650 mg via RECTAL
  Filled 2022-01-02 (×2): qty 1

## 2022-01-02 MED ORDER — POTASSIUM CHLORIDE 20 MEQ PO PACK
40.0000 meq | PACK | Freq: Once | ORAL | Status: AC
  Administered 2022-01-02: 40 meq
  Filled 2022-01-02: qty 2

## 2022-01-02 NOTE — H&P (Signed)
History and Physical    PLEASE NOTE THAT DRAGON DICTATION SOFTWARE WAS USED IN THE CONSTRUCTION OF THIS NOTE.   Jamie [name] NWG:[id number] DOB: 09/06/2000 DOA: 01/02/2022  PCP: Ok Edwards, MD  Patient coming from: home   I have personally briefly reviewed patient's old medical records in [place]  Chief Complaint: Fever  HPI: Jamie [name] is a 21 y.o. 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   The following history is provided by the patient's mother and father, who are present at bedside, in addition to my discussions with the EDP and via chart review.  The patient has been experiencing an objective fever for the last 1 to 2 days, with family reporting temperature max of 101 at home over that timeframe.  They have also noted new onset cough, nonproductive, over the last few days.  Has been associated with episodes of nonbloody, nonbilious emesis, with most recent episode occurring yesterday.  They also noted some interval increase in abdominal distention corresponding with relative constipation over the course of the last 1 week and note recent replacement of the patient's G-tube.  They have tried a few doses of prn lactulose for the constipation, without any significant interval improvement. most recent bowel movement occurred 2 days ago.  No associated any recent melena or hematochezia.  In the setting of her quadriplegia complicated by neurogenic bladder, she does not have a chronic indwelling Foley catheter, rather family performs serial and out  caths at home.  Family has not had a recent worsening of peripheral edema, or any recent lower extremity erythema.  They confirm that she is on DVT prophylaxis with Lovenox 40 mg SQ daily in the setting of her chronically bedbound status.     ED Course:  Vital signs in the ED were notable for the following: Temperature max 101.4; initial heart rates in the 130s 140s, subsequently decreasing into the 90s to low 100s following initiation of IV fluids, as further quantified below; blood pressure 107/73 - 130/90; respiratory rate 13-23, oxygen saturation 97 to 98% on baseline 5 L continuous trach collar.  Labs were notable for the following: CMP notable for the following potassium 2.9, prn 3 3, creatinine 0.85 compared to most recent prior value of 0.31 in August 2023.,  Calcium 10.7 compared to adjusted calcium of 10.2 in November 2022, AST 51, otherwise liver enzymes within normal limits.  Lipase 30.  High-sensitivity troponin I initially 12, with repeat value trending down to 10.  Lactic acid, with repeat value trending up to 3.1.  CBC notable for white blood cell count 10,400 with 74% neutrophils.  Urinalysis associated with hazy appearing specimen amber in color, 6-10 wbc, trace LE, 5 ketones, 30 protein, no RBCs, and specific gravity 1.031.  Blood cultures x2 and urine culture collected prior to initiation of IV antibiotics.  COVID-19 PCR negative.  Imaging and additional notable ED work-up: EKG shows sinus rhythm with heart rate 95, normal intervals, nonspecific T wave inversion in leads III, aVF, V3, and less than 1 mm ST depression in V3, without any evidence of ST elevation.  Chest x-ray shows no evidence of acute cardiopulmonary process, including no  evidence of infiltrate of any active effusion, or pneumothorax..  CT abdomen/pelvis with contrast shows G-tube within the stomach, without overt complicating features, also showing large stool burden within the colon We will, with mild gaseous  distention of the left colon without evidence of obstruction, abscess, perforation.  Otherwise, CT abdomen/pelvis shows no evidence of acute intra-abdominal or acute intrapelvic process.  While in the ED, the following were administered: Fentanyl 50 mcg IV x1, Rocephin, normal saline x1.5 L bolus followed by continuous LR at 150 cc/h.  Subsequently, the patient was admitted for further evaluation and management suspected severe sepsis due to urinary tract infection, along with CTA evidence of constipation, dehydration, and multiple electrolyte abnormalities.  Including hypokalemia, hypercalcemia, as well as acute kidney injury.      Review of Systems: As per HPI otherwise 10 point review of systems negative.   Past Medical History:  Diagnosis Date   Acquired autoimmune hypothyroidism    Dx 12/2014, TSH 110, FT4 0.3    Past Surgical History:  Procedure Laterality Date   IR GASTROSTOMY TUBE MOD SED  03/16/2021    Social History:  reports that she has never smoked. She has never used smokeless tobacco. She reports that she does not currently use alcohol. She reports that she does not currently use drugs.   Allergies  Allergen Reactions   Rituximab Other (See Comments)    Fever / Tachycardia / Infusion reaction after dose administered on 03/03/2021.  Patient was premedicated.  Infusion eventually completed at a slow rate / multiple interruptions.    Family History  Problem Relation Age of Onset   Healthy Mother    Healthy Father     Family history reviewed and not pertinent    Prior to Admission medications   Medication Sig Start Date End Date Taking? Authorizing Provider  acetaminophen (TYLENOL) 160 MG/5ML solution Place 20.3 mLs (650 mg total) into feeding tube every 4 (four) hours as needed for fever. 03/16/21  Yes Noemi Chapel P, DO  Baclofen 5 MG TABS Take 5 mg by mouth 2 (two) times daily. 12/29/21  Yes [provider]  ciprofloxacin (CILOXAN) 0.3 % ophthalmic  solution Place 2 drops into both eyes 2 (two) times daily. 11/22/21  Yes [provider]  enoxaparin (LOVENOX) 40 MG/0.4ML injection Inject 0.4 mLs (40 mg total) into the skin daily. 03/17/21  Yes Julian Hy, DO  folic acid (FOLVITE) 1 MG tablet Take 1 mg by mouth daily. 12/18/21  Yes [provider]  gabapentin (NEURONTIN) 100 MG capsule Take 100 mg by mouth 3 (three) times daily. 12/18/21  Yes [provider]  lactulose (CHRONULAC) 10 GM/15ML solution SMARTSIG:30 Milliliter(s) By Mouth Daily PRN 12/14/21  Yes [provider]  levETIRacetam (KEPPRA) 1500 MG/100ML SOLN Inject 100 mLs (1,500 mg total) into the vein every 12 (twelve) hours. 03/16/21  Yes Julian Hy, DO  levothyroxine (SYNTHROID) 100 MCG tablet Place 1 tablet (100 mcg total) into feeding tube daily at 6 (six) AM. 03/17/21  Yes Julian Hy, DO  Mouthwashes (MOUTH RINSE) LIQD solution 15 mLs by Mouth Rinse route 2 (two) times daily. 03/16/21  Yes Julian Hy, DO  scopolamine (TRANSDERM-SCOP) 1 MG/3DAYS Place 1 patch (1.5 mg total) onto the skin every 3 (three) days. 03/18/21  Yes Julian Hy, DO  tenofovir (VIREAD) 300 MG tablet Place 1 tablet (300 mg total) into feeding tube daily. 03/17/21  Yes Julian Hy, DO  tobramycin-dexamethasone [facility]) ophthalmic solution Place 1 drop  into both eyes every 6 (six) hours. 12/30/21  Yes [provider]  albuterol (PROVENTIL) (2.5 MG/3ML) 0.083% nebulizer solution Take 3 mLs (2.5 mg total) by nebulization once as needed for wheezing or shortness of breath (or anaphylaxis due to Rituxan infusion). Patient not taking: Reported on 01/02/2022 03/16/21   Noemi Chapel P, DO  atropine 1 % ophthalmic solution Place 1 drop under the tongue 3 (three) times daily as needed (sialorrhea). Patient not taking: Reported on 01/02/2022 03/16/21   Noemi Chapel P, DO  bethanechol (URECHOLINE) 10 MG tablet Take 1 tablet (10 mg total) by mouth 3 (three) times  daily. Patient not taking: Reported on 01/02/2022 03/16/21   Noemi Chapel P, DO  Chlorhexidine Gluconate Cloth 2 % PADS Apply 6 each topically daily. Patient not taking: Reported on 01/02/2022 03/17/21   Noemi Chapel P, DO  chlorhexidine gluconate, MEDLINE KIT, (PERIDEX) 0.12 % solution 15 mLs by Mouth Rinse route 2 (two) times daily. Patient not taking: Reported on 01/02/2022 03/16/21   Noemi Chapel P, DO  clonazePAM (KLONOPIN) 1 MG disintegrating tablet Place 1 tablet (1 mg total) into feeding tube at bedtime. Patient not taking: Reported on 01/02/2022 03/16/21   Noemi Chapel P, DO  clonazePAM (KLONOPIN) 1 MG disintegrating tablet Place 1 tablet (1 mg total) into feeding tube 3 (three) times daily. Patient not taking: Reported on 01/02/2022 03/17/21   Noemi Chapel P, DO  cloNIDine (CATAPRES) 0.1 MG tablet Place 1 tablet (0.1 mg total) into feeding tube 2 (two) times daily. Patient not taking: Reported on 01/02/2022 03/16/21   Noemi Chapel P, DO  colchicine 0.6 MG tablet Place 1 tablet (0.6 mg total) into feeding tube daily. Patient not taking: Reported on 01/02/2022 03/17/21   Noemi Chapel P, DO  dantrolene (DANTRIUM) 25 MG capsule Take 1 capsule (25 mg total) by mouth 2 (two) times daily. Patient not taking: Reported on 01/02/2022 03/16/21   Noemi Chapel P, DO  diphenhydrAMINE (BENADRYL) 50 MG/ML injection Inject 1 mL (50 mg total) into the vein once as needed (for cytokine release/hypersensitivity reactions OR anaphylaxis due to Rituxan infusion). Patient not taking: Reported on 01/02/2022 03/16/21   Noemi Chapel P, DO  famotidine (PEPCID) 20-0.9 MG/50ML-% Inject 50 mLs (20 mg total) into the vein once as needed (for cytokine release/hypersensitivity reactions OR anaphylaxis due to Rituxan infusion). Patient not taking: Reported on 01/02/2022 03/16/21   Noemi Chapel P, DO  fentaNYL (SUBLIMAZE) SOLN Inject 50-100 mcg into the vein every 15 (fifteen) minutes as needed (to maintain RASS & CPOT  goal.). Patient not taking: Reported on 01/02/2022 03/16/21   Noemi Chapel P, DO  fentaNYL 10 mcg/ml SOLN infusion Inject 50-200 mcg/hr into the vein continuous. Patient not taking: Reported on 01/02/2022 03/16/21   Noemi Chapel P, DO  LORazepam (ATIVAN) 2 MG/ML injection Inject 1 mL (2 mg total) into the vein every 4 (four) hours as needed (Dyskinesias). Patient not taking: Reported on 01/02/2022 03/16/21   Noemi Chapel P, DO  methylPREDNISolone sodium succinate 1,000 mg in sodium chloride 0.9 % 50 mL Inject 1,000 mg into the vein daily. Patient not taking: Reported on 01/02/2022 03/17/21   Noemi Chapel P, DO  nafcillin 2 g in sodium chloride 0.9 % 100 mL Inject 2 g into the vein every 4 (four) hours. Patient not taking: Reported on 01/02/2022 03/16/21   Noemi Chapel P, DO  norepinephrine (LEVOPHED) 4-5 MG/250ML-% SOLN Inject 2-10 mcg/min into the vein continuous. Patient not taking: Reported on 01/02/2022 03/16/21  Julian Hy, DO  Nutritional Supplements (FEEDING SUPPLEMENT, PROSOURCE TF,) liquid Place 45 mLs into feeding tube 3 (three) times daily. Patient taking differently: Place 237 mLs into feeding tube 5 (five) times daily. 03/16/21   Julian Hy, DO  Nutritional Supplements (FEEDING SUPPLEMENT, VITAL 1.5 CAL,) LIQD Place 1,000 mLs into feeding tube continuous. Patient not taking: Reported on 01/02/2022 03/16/21   Noemi Chapel P, DO  pantoprazole sodium (PROTONIX) 40 mg Place 40 mg into feeding tube at bedtime. Patient not taking: Reported on 01/02/2022 03/16/21   Noemi Chapel P, DO  prednisoLONE acetate (PRED FORTE) 1 % ophthalmic suspension Place into the left eye. Patient not taking: Reported on 01/02/2022 12/06/21   [provider]  propofol (DIPRIVAN) 1000 MG/100ML EMUL injection Inject 272-4,352 mcg/min into the vein continuous. Patient not taking: Reported on 01/02/2022 03/16/21   Noemi Chapel P, DO  propranolol (INDERAL) 20 MG/5ML solution Place 2.5 mLs (10 mg total) into  feeding tube 3 (three) times daily. Patient not taking: Reported on 01/02/2022 03/16/21   Noemi Chapel P, DO  thiamine 100 MG tablet Place 1 tablet (100 mg total) into feeding tube daily. Patient not taking: Reported on 01/02/2022 03/17/21   Noemi Chapel P, DO  valproic acid (DEPAKENE) 250 MG/5ML solution Place 10 mLs (500 mg total) into feeding tube every 8 (eight) hours. Patient not taking: Reported on 01/02/2022 03/16/21   Julian Hy, DO     Objective    Physical Exam: Vitals:   01/02/22 1915 01/02/22 1945 01/02/22 2015 01/02/22 2045  BP:      Pulse: (!) 103 (!) 112 (!) 132 (!) 102  Resp: 19 (!) 23 18 (!) 22  Temp:      TempSrc:      SpO2: 97% 96% 98% 97%    General: appears to be stated age; somnolent, non-verbal (baseline) Skin: warm, dry, no rash Head:  AT/[place] Mouth:  Oral mucosa membranes appear dry, normal dentition Neck: supple; tracheostomy noted as well as trach collar Heart: Mildly tachycardic, but regular; did not appreciate any M/R/G Lungs: CTAB, did not appreciate any wheezes, rales, or rhonchi Abdomen: + BS; soft, ND; G-tube noted Vascular: 2+ pedal pulses b/l; 2+ radial pulses b/l Extremities: no peripheral edema, no muscle wasting Neuro: unable able to perform full neuro assessment in setting of chronic quadriplegia, baseline nonverbal status    Labs on Admission: I have personally reviewed following labs and imaging studies  CBC: Recent Labs  Lab 01/02/22 1430  WBC 10.4  NEUTROABS 7.7  HGB 14.3  HCT 46.5*  MCV 82.7  PLT 700*   Basic Metabolic Panel: Recent Labs  Lab 01/02/22 1430  NA 142  K 2.9*  CL 89*  CO2 33*  GLUCOSE 133*  BUN 45*  CREATININE 0.85  CALCIUM 10.7*   GFR: CrCl cannot be calculated (Unknown ideal weight.). Liver Function Tests: Recent Labs  Lab 01/02/22 1430  AST 51*  ALT 27  ALKPHOS 58  BILITOT 0.7  PROT 8.1  ALBUMIN 4.5   Recent Labs  Lab 01/02/22 1430  LIPASE 30   No results for input(s): "AMMONIA"  in the last 168 hours. Coagulation Profile: No results for input(s): "INR", "PROTIME" in the last 168 hours. Cardiac Enzymes: No results for input(s): "CKTOTAL", "CKMB", "CKMBINDEX", "TROPONINI" in the last 168 hours. BNP (last 3 results) No results for input(s): "PROBNP" in the last 8760 hours. HbA1C: No results for input(s): "HGBA1C" in the last 72 hours. CBG: No results for input(s): "  GLUCAP" in the last 168 hours. Lipid Profile: No results for input(s): "CHOL", "HDL", "LDLCALC", "TRIG", "CHOLHDL", "LDLDIRECT" in the last 72 hours. Thyroid Function Tests: No results for input(s): "TSH", "T4TOTAL", "FREET4", "T3FREE", "THYROIDAB" in the last 72 hours. Anemia Panel: No results for input(s): "VITAMINB12", "FOLATE", "FERRITIN", "TIBC", "IRON", "RETICCTPCT" in the last 72 hours. Urine analysis:    Component Value Date/Time   COLORURINE AMBER (A) 01/02/2022 1734   APPEARANCEUR HAZY (A) 01/02/2022 1734   LABSPEC 1.031 (H) 01/02/2022 1734   PHURINE 5.0 01/02/2022 1734   GLUCOSEU NEGATIVE 01/02/2022 1734   HGBUR NEGATIVE 01/02/2022 1734   BILIRUBINUR NEGATIVE 01/02/2022 1734   KETONESUR 5 (A) 01/02/2022 1734   PROTEINUR 30 (A) 01/02/2022 1734   UROBILINOGEN 0.2 02/05/2021 1459   NITRITE NEGATIVE 01/02/2022 1734   LEUKOCYTESUR TRACE (A) 01/02/2022 1734    Radiological Exams on Admission: CT ABDOMEN PELVIS W CONTRAST  Result Date: 01/02/2022 CLINICAL DATA:  Abdominal pain, distention. Bowel obstruction suspected. EXAM: CT ABDOMEN AND PELVIS WITH CONTRAST TECHNIQUE: Multidetector CT imaging of the abdomen and pelvis was performed using the standard protocol following bolus administration of intravenous contrast. RADIATION DOSE REDUCTION: This exam was performed according to the departmental dose-optimization program which includes automated exposure control, adjustment of the mA and/or kV according to patient size and/or use of iterative reconstruction technique. CONTRAST:  137mL  OMNIPAQUE IOHEXOL 300 MG/ML  SOLN COMPARISON:  02/13/2021 FINDINGS: Lower chest: No acute abnormality Hepatobiliary: No focal hepatic abnormality. Gallbladder unremarkable. Pancreas: No focal abnormality or ductal dilatation. Spleen: No focal abnormality.  Normal size. Adrenals/Urinary Tract: No adrenal abnormality. No focal renal abnormality. No stones or hydronephrosis. Urinary bladder is unremarkable. Stomach/Bowel: Gastrostomy tube in place within the stomach. No evidence of bowel obstruction. Large stool burden throughout the colon. Mild gaseous distention of the left colon. Vascular/Lymphatic: No evidence of aneurysm or adenopathy. Reproductive: Uterus and adnexa unremarkable.  No mass. Other: No free fluid or free air. Musculoskeletal: No acute bony abnormality. IMPRESSION: Gastrostomy tube within the stomach. No visible complicating feature. Large stool burden in the colon. Mild gaseous distention of left colon. No evidence of bowel obstruction. Electronically Signed   By: Rolm Baptise M.D.   On: 01/02/2022 18:35   DG Chest Portable 1 View  Result Date: 01/02/2022 CLINICAL DATA:  Cough, fever EXAM: PORTABLE CHEST 1 VIEW COMPARISON:  03/15/2021 FINDINGS: Tracheostomy tube in place. Normal heart size. No focal airspace consolidation, pleural effusion, or pneumothorax. IMPRESSION: No active disease. Electronically Signed   By: Davina Poke D.O.   On: 01/02/2022 14:49     EKG: Independently reviewed, with result as described above.    Assessment/Plan   Principal Problem:   UTI (urinary tract infection) Active Problems:   Acquired hypothyroidism   Severe sepsis (HCC)   Lactic acidosis   Constipation   Hypokalemia   Hypercalcemia   Dehydration   AKI (acute kidney injury) ([place])   Neurogenic bladder   Chronic respiratory failure with hypoxia (HCC)   Seizure disorder (HCC)      #) Severe sepsis due to  suspected uti: After presenting with 1 to 2 days of objective fever at home,  urinalysis associated with stricture*pyuria, bacteria, presence of leukocyte Estrace, in the absence of any squamous epithelial cells to suggest a contaminated specimen.  Of note, urine specimen was collected via in and out cath, which is notable given the patient's history of neurogenic bladder in the setting of quadriplegia, with assist with increased risk for development of urinary  tract infection due to relative urostasis. Will proceed with treatment of suspected urinary tract infection, although it is acknowledged that the degree of her pyuria is slightly less that would typically be associated with a urinary tract infection in a female.   SIRS criteria met via fever, tachycardia, tachypnea. Lactic acid level: Initially but significantly 5, with repeat value trending up slightly to 3.1. Of note, given the associated presence of suspected end organ damage in the form of concominant presenting elevated lactate as well as acute kidney injury, criteria are met for pt's sepsis to be considered severe in nature. However, in the absence of lactic acid level that is greater than or equal to 4.0, and in the absence of any associated hypotension refractory to IVF's, there are no indications for administration of a 30 mL/kg IVF bolus at this time.   Additional ED work-up/management notable for: Collection of the urine culture as well as blood cultures x2 5 by initiation of Rocephin, which will be continued following review by RN patient pharmacist given a previous history of ESBL, noting that most recent prior urinary tract infections are not associated with the latter.   No overt e/o additional infectious process at this time, including COVID-19 PCR negative, chest x-ray shows no evidence of acute process.  However, given report of new onset cough which started after episode of vomiting, dizziness plegic patient, raises index of suspicion for potential for aspiration pneumonia will also noting increased risk for  post radiographic negative in the context of clinical appearance of dehydration.  Will check procalcitonin level to further assess.    Plan: CBC w/ diff and CMP in AM.  Follow for results of blood cx's x 2 and urine culture. Abx: Continue Rocephin as above.  Continue secondary risk, with repeat lactate pending.  Add on procalcitonin level.  Prn acetaminophen and via G-tube for fever.        #) Lactic acidosis: In addition to likely contribution from presenting severe sepsis, as above, there is also considered for additional contributing factors leading to her elevated lactate, including contributions from acute kidney injury, dehydration, as well as an element starvation due to lactic acidosis given the presence of ketones identified on UA.  Given chronic bedbound status in this patient with acute kidney injury, will also add on CPK level.  Plan: Continuous IV fluids, as above, with repeat lactate ordered for this evening.  Further evaluation management of severe sepsis, as above.  Further evaluation management of AKI, as further detailed below.  Add on CPK, INR.  I have consulted inpatient dietitian for assistance with resumption of home tube feeds.  Repeat CMP and CBC in the morning.         #) Constipation: Family reports approximately 1 week to relative constipation, with most recent bowel movement occurring 2 days ago, with presenting CT abdomen/pelvis showing evidence of constipation without corresponding evidence of obstruction or perforation.  While family notes that constipation started shortly after replacement of G-tube, CT abdomen/pelvis also demonstrates evidence to suggest appropriate splint NG tube without any overt complicating factors at this time.  N/staying options with family regarding medical management of the patient's constipation, they request that we refrain from enemas at this time, but are amenable to medications administered via the G-tube for this purpose.   Dehydration likely playing a contributory role as well, with resultant hypercalcemia, as further noted below.  Plan: IV fluids, as above.  Monitor strict I's and O's and daily weights.  MiraLAX 17  g scheduled via G-tube on twice daily basis, first dose now.  Colace 100 mg via G-tube scheduled twice daily, first dose now.  Further evaluation and management to titration/hypercalcemia, as below.  Add on TSH level.         #) hypokalemia: Presenting serum potassium level 2.9, with suspected contribution from recent increase in GI losses in the form of recent nausea/vomiting.  Will provide associated supplementation via G-tube, while refraining from aggressive repletion given concomitant presenting acute kidney injury. Did have pneumonia on Plan: Continuous LR, as above.  Potassium chloride 40 mill equivalents liquidated x1 dose via the G-tube at this time.  At [facility] magnesium level.  Monitor on symmetry.  Repeat CMP in the morning.           #) Dehydration: Clinical suspicion for such, including the appearance of dry oral mucous membranes as well as laboratory findings notable for acute prerenal azotemia and UA demonstrating elevated specific gravity.  Suspect medication from recent increase in GI losses, as above, in this patient who was reportedly G-tube dependent. No e/o associated hypotension.   Allergy so far is been negative for COVID: Monitor strict I's and O's.  Daily weights.  Repeat CMP in the morning. IVF's  as above.            #) Hypercalcemia: Presenting labs reflect serum calcium level of 10.7 compared to most recent prior calcium level, which was then adjusted level of 10.2 in November 2022.  Suspect element of dehydration, without overt pharmacologic contribution. Will initiate gentle IVF's, as above, with repeat calcium level in the morning, with consideration for further expansion of work-up if no ensuing improvement in serum calcium level following interval IVF  administration.  May be posing a secondary contribution towards the patient's presenting constipation, as above.     Plan: LR, as above.  Monitor strict I's&O's, daily weights.  CMP in the morning.  Check serum Mg and Phos levels.          #) Acute Kidney Injury: Presenting creatinine 0.85 compared to baseline ranges 0.3-0.4, with most recent prior serum creatinine data point noted to be 0.31 in August 2023.  Potentially multifactorial in etiology, with suspected prerenal contribution from dehydration, as evidenced by acute prerenal azotemia, while noting a degree of patient's prerenal azotemia is as a consequence of her chronic tube feeds.  Suspect that additional prerenal contribution is a consequence of severe sepsis.  Additionally, there is potential for a postrenal contribution as a consequence of presenting constipation causing her relative obstruction of outflow at the level of the bladder neck.  Of note, today CT abdomen/pelvis showed no evidence of overt postrenal obstructive source.  We will proceed with further evaluation management of dehydration as well as constipation, as further detailed below, while expanding on evaluation for underlying AKI, as further detailed below. UA with microscopy results, as above, including no rbcs, 30 protein.  Nephrology consulted for dialysis will patient is here Plan: monitor strict I's & O's and daily weights. Attempt to avoid nephrotoxic agents. Refrain from NSAIDs. Repeat CMP in the morning. Check serum magnesium level. Add-on random urine sodium and random urine creatinine. Check cpk. IVF's, above.  Further evaluation and management of constipation, as above.          #) Chronic quadriplegia, tracheostomy dependent associated prophylaxis respiratory 5 L continuous trach collar, neurogenic bladder, G-tube dependent.  Patient noted to be maintaining O2 sats in the high 90s on her baseline liters continuous trach collar.  In setting of her  chronic neurogenic bladder, family manages at home via serial in and out cath.  Will initiate order for every 6 hours bladder scans with prn in and out cath at this time, as below.  I have also placed consult with patient dietary to assist her she did complete nutritional status/nutritional needs and to initiate TF's on basis of this assessment.  Of note, will p.o. medications were administered via her G-tube) 100% needs via the G-tube as well.  Plan: Every 6 hour bladder scans with as needed straight cath for greater than 660s urine.  Monitor strict I's and O's Daily weights.  Dietary consultation as above, including for assessment nutritional needs and initiation/management of tube feeds via G-tube.          #) Acquired hypothyroidism: Documented history of such, on Synthroid as an outpatient.  In setting of worsening constipation we will also add on TSH level.  Plan: Check TSH and resume home Synthroid.            #) Seizure disorder: Documented history of Cetraben Keppra as an outpatient.  No overt evidence of active seizures at this time.  Plan: Resume home Keppra, as above.      DVT prophylaxis: Lovenox 40 mg subcu daily Code Status: Full code Family Communication: I discussed the patient's case with her mother and father, who are present at bedside. Disposition Plan: Per Rounding Team Consults called: none;  Admission status: Inpatient    PLEASE NOTE THAT DRAGON DICTATION SOFTWARE WAS USED IN THE CONSTRUCTION OF THIS NOTE.   [place] DO [facility]  From [place]   01/02/2022, 10:01 PM

## 2022-01-02 NOTE — ED Notes (Signed)
Pt to CT

## 2022-01-02 NOTE — Progress Notes (Signed)
Pt being followed by [facility] for Sepsis protocol. 

## 2022-01-02 NOTE — ED Notes (Signed)
Admitting at bedside 

## 2022-01-02 NOTE — ED Triage Notes (Signed)
Pt here from home via [facility] for constipation and fever.  Pt is nonverbal quad who is cared for at home. Trach present.  No difficulty breathing noted.   Vs  115 hr 120/68 bp 95 sats Rr 18

## 2022-01-02 NOTE — ED Provider Notes (Signed)
[facility]ENC[facility] DEPART[facility] Provider Note   CSN: [id number] Arrival date & time: 01/02/22  1341     History  Chief Complaint  Patient presents with   Constipation   Fever    Jamie [name] is a 21 y.o. female.  The history is provided by a parent, a relative and medical records. The history is limited by the condition of the patient.  Constipation Severity:  Unable to specify Time since last bowel movement:  3 days (2 weeks but no bm in 3 days) Timing:  Constant Progression:  Worsening Chronicity:  New Context comment:  Recent feeding tube exchange Stool description:  None produced Associated symptoms: fever and vomiting   Associated symptoms: no dysuria   Risk factors: hx of abdominal surgery and recent surgery   Fever Max temp prior to arrival:  101 Temp source:  Oral and rectal Severity:  Moderate Onset quality:  Gradual Duration:  2 days Timing:  Intermittent Progression:  Waxing and waning Chronicity:  New Associated symptoms: cough and vomiting   Associated symptoms: no congestion, no dysuria and no rash        Home Medications Prior to Admission medications   Medication Sig Start Date End Date Taking? Authorizing Provider  acetaminophen (TYLENOL) 160 MG/5ML solution Place 20.3 mLs (650 mg total) into feeding tube every 4 (four) hours as needed for fever. 03/16/21   Noemi Chapel P, DO  albuterol (PROVENTIL) (2.5 MG/3ML) 0.083% nebulizer solution Take 3 mLs (2.5 mg total) by nebulization once as needed for wheezing or shortness of breath (or anaphylaxis due to Rituxan infusion). 03/16/21   Noemi Chapel P, DO  atropine 1 % ophthalmic solution Place 1 drop under the tongue 3 (three) times daily as needed (sialorrhea). 03/16/21   Julian Hy, DO  bethanechol (URECHOLINE) 10 MG tablet Take 1 tablet (10 mg total) by mouth 3 (three) times daily. 03/16/21   Julian Hy, DO  Chlorhexidine Gluconate Cloth 2 % PADS Apply 6 each  topically daily. 03/17/21   Julian Hy, DO  chlorhexidine gluconate, MEDLINE KIT, (PERIDEX) 0.12 % solution 15 mLs by Mouth Rinse route 2 (two) times daily. 03/16/21   Julian Hy, DO  clonazePAM (KLONOPIN) 1 MG disintegrating tablet Place 1 tablet (1 mg total) into feeding tube at bedtime. 03/16/21   Julian Hy, DO  clonazePAM (KLONOPIN) 1 MG disintegrating tablet Place 1 tablet (1 mg total) into feeding tube 3 (three) times daily. 03/17/21   Julian Hy, DO  cloNIDine (CATAPRES) 0.1 MG tablet Place 1 tablet (0.1 mg total) into feeding tube 2 (two) times daily. 03/16/21   Julian Hy, DO  colchicine 0.6 MG tablet Place 1 tablet (0.6 mg total) into feeding tube daily. 03/17/21   Julian Hy, DO  dantrolene (DANTRIUM) 25 MG capsule Take 1 capsule (25 mg total) by mouth 2 (two) times daily. 03/16/21   Julian Hy, DO  diphenhydrAMINE (BENADRYL) 50 MG/ML injection Inject 1 mL (50 mg total) into the vein once as needed (for cytokine release/hypersensitivity reactions OR anaphylaxis due to Rituxan infusion). 03/16/21   Noemi Chapel P, DO  enoxaparin (LOVENOX) 40 MG/0.4ML injection Inject 0.4 mLs (40 mg total) into the skin daily. 03/17/21   Julian Hy, DO  famotidine (PEPCID) 20-0.9 MG/50ML-% Inject 50 mLs (20 mg total) into the vein once as needed (for cytokine release/hypersensitivity reactions OR anaphylaxis due to Rituxan infusion). 03/16/21   Julian Hy, DO  fentaNYL (SUBLIMAZE) SOLN  Inject 50-100 mcg into the vein every 15 (fifteen) minutes as needed (to maintain RASS & CPOT goal.). 03/16/21   Noemi Chapel P, DO  fentaNYL 10 mcg/ml SOLN infusion Inject 50-200 mcg/hr into the vein continuous. 03/16/21   Julian Hy, DO  gabapentin (NEURONTIN) 250 MG/5ML solution Place 6 mLs (300 mg total) into feeding tube every 8 (eight) hours. 03/16/21   Julian Hy, DO  levETIRacetam (KEPPRA) 1500 MG/100ML SOLN Inject 100 mLs (1,500 mg total) into the vein every 12 (twelve) hours.  03/16/21   Julian Hy, DO  levothyroxine (SYNTHROID) 100 MCG tablet Place 1 tablet (100 mcg total) into feeding tube daily at 6 (six) AM. 03/17/21   Carlis Abbott, Venita Sheffield, DO  LORazepam (ATIVAN) 2 MG/ML injection Inject 1 mL (2 mg total) into the vein every 4 (four) hours as needed (Dyskinesias). 03/16/21   Noemi Chapel P, DO  methylPREDNISolone sodium succinate 1,000 mg in sodium chloride 0.9 % 50 mL Inject 1,000 mg into the vein daily. 03/17/21   Julian Hy, DO  Mouthwashes (MOUTH RINSE) LIQD solution 15 mLs by Mouth Rinse route 2 (two) times daily. 03/16/21   Noemi Chapel P, DO  nafcillin 2 g in sodium chloride 0.9 % 100 mL Inject 2 g into the vein every 4 (four) hours. 03/16/21   Julian Hy, DO  norepinephrine (LEVOPHED) 4-5 MG/250ML-% SOLN Inject 2-10 mcg/min into the vein continuous. 03/16/21   Julian Hy, DO  Nutritional Supplements (FEEDING SUPPLEMENT, PROSOURCE TF,) liquid Place 45 mLs into feeding tube 3 (three) times daily. 03/16/21   Julian Hy, DO  Nutritional Supplements (FEEDING SUPPLEMENT, VITAL 1.5 CAL,) LIQD Place 1,000 mLs into feeding tube continuous. 03/16/21   Julian Hy, DO  pantoprazole sodium (PROTONIX) 40 mg Place 40 mg into feeding tube at bedtime. 03/16/21   Noemi Chapel P, DO  propofol (DIPRIVAN) 1000 MG/100ML EMUL injection Inject 272-4,352 mcg/min into the vein continuous. 03/16/21   Julian Hy, DO  propranolol (INDERAL) 20 MG/5ML solution Place 2.5 mLs (10 mg total) into feeding tube 3 (three) times daily. 03/16/21   Julian Hy, DO  scopolamine (TRANSDERM-SCOP) 1 MG/3DAYS Place 1 patch (1.5 mg total) onto the skin every 3 (three) days. 03/18/21   Julian Hy, DO  tenofovir (VIREAD) 300 MG tablet Place 1 tablet (300 mg total) into feeding tube daily. 03/17/21   Julian Hy, DO  thiamine 100 MG tablet Place 1 tablet (100 mg total) into feeding tube daily. 03/17/21   Julian Hy, DO  valproic acid (DEPAKENE) 250 MG/5ML solution Place 10  mLs (500 mg total) into feeding tube every 8 (eight) hours. 03/16/21   Julian Hy, DO      Allergies    Rituximab    Review of Systems   Review of Systems  Unable to perform ROS: Patient nonverbal  Constitutional:  Positive for fever.  HENT:  Negative for congestion.   Respiratory:  Positive for cough.   Gastrointestinal:  Positive for abdominal distention, constipation and vomiting.  Genitourinary:  Negative for dysuria and frequency.  Skin:  Negative for rash.  Psychiatric/Behavioral:  Negative for agitation.     Physical Exam Updated Vital Signs There were no vitals taken for this visit. Physical Exam Vitals and nursing note reviewed.  Constitutional:      General: She is not in acute distress.    Appearance: She is well-developed. She is ill-appearing. She is not toxic-appearing or diaphoretic.  HENT:  Head: Normocephalic and atraumatic.     Mouth/Throat:     Mouth: Mucous membranes are dry.  Eyes:     Conjunctiva/sclera: Conjunctivae normal.  Cardiovascular:     Rate and Rhythm: Regular rhythm. Tachycardia present.     Heart sounds: No murmur heard. Pulmonary:     Effort: Pulmonary effort is normal. No respiratory distress.     Breath sounds: Rhonchi present. No wheezing or rales.  Chest:     Chest wall: No tenderness.  Abdominal:     Palpations: Abdomen is soft.     Tenderness: There is abdominal tenderness (grimaced slightly during palpation). There is no guarding or rebound.  Musculoskeletal:        General: No swelling or tenderness.     Cervical back: Neck supple.  Skin:    General: Skin is warm and dry.     Capillary Refill: Capillary refill takes less than 2 seconds.     Findings: No erythema or rash.  Neurological:     Mental Status: She is alert. Mental status is at baseline.  Psychiatric:        Mood and Affect: Mood normal.     ED Results / Procedures / Treatments   Labs (all labs ordered are listed, but only abnormal results are  displayed) Labs Reviewed  CBC WITH DIFFERENTIAL/PLATELET - Abnormal; Notable for the following components:      Result Value   RBC 5.62 (*)    HCT 46.5 (*)    MCH 25.4 (*)    Platelets 525 (*)    Monocytes Absolute 1.1 (*)    All other components within normal limits  COMPREHENSIVE METABOLIC PANEL - Abnormal; Notable for the following components:   Potassium 2.9 (*)    Chloride 89 (*)    CO2 33 (*)    Glucose, Bld 133 (*)    BUN 45 (*)    Calcium 10.7 (*)    AST 51 (*)    Anion gap 20 (*)    All other components within normal limits  LACTIC ACID, PLASMA - Abnormal; Notable for the following components:   Lactic Acid, Venous 2.5 (*)    All other components within normal limits  URINALYSIS, ROUTINE W REFLEX MICROSCOPIC - Abnormal; Notable for the following components:   Color, Urine AMBER (*)    APPearance HAZY (*)    Specific Gravity, Urine 1.031 (*)    Ketones, ur 5 (*)    Protein, ur 30 (*)    Leukocytes,Ua TRACE (*)    Bacteria, UA RARE (*)    All other components within normal limits  SARS CORONAVIRUS 2 BY RT PCR  CULTURE, BLOOD (ROUTINE X 2)  CULTURE, BLOOD (ROUTINE X 2)  URINE CULTURE  LIPASE, BLOOD  LACTIC ACID, PLASMA  I-STAT BETA HCG BLOOD, ED ([facility], [facility], [facility] ONLY)  TROPONIN I (HIGH SENSITIVITY)  TROPONIN I (HIGH SENSITIVITY)    EKG EKG Interpretation  Date/Time:  Sunday January 02 2022 15:11:25 EDT Ventricular Rate:  95 PR Interval:  116 QRS Duration: 79 QT Interval:  389 QTC Calculation: 489 R Axis:   75 Text Interpretation: Sinus rhythm Borderline short PR interval Abnormal T, consider ischemia, anterior leads when compared to prior, faster rate and more diffuse t wave inversions. No STEMI Confirmed by Antony Blackbird [phone]) on 01/02/2022 4:22:34 PM  Radiology CT ABDOMEN PELVIS W CONTRAST  Result Date: 01/02/2022 CLINICAL DATA:  Abdominal pain, distention. Bowel obstruction suspected. EXAM: CT ABDOMEN AND PELVIS WITH CONTRAST TECHNIQUE: Multidetector  CT imaging of the abdomen and pelvis was performed using the standard protocol following bolus administration of intravenous contrast. RADIATION DOSE REDUCTION: This exam was performed according to the departmental dose-optimization program which includes automated exposure control, adjustment of the mA and/or kV according to patient size and/or use of iterative reconstruction technique. CONTRAST:  163m OMNIPAQUE IOHEXOL 300 MG/ML  SOLN COMPARISON:  02/13/2021 FINDINGS: Lower chest: No acute abnormality Hepatobiliary: No focal hepatic abnormality. Gallbladder unremarkable. Pancreas: No focal abnormality or ductal dilatation. Spleen: No focal abnormality.  Normal size. Adrenals/Urinary Tract: No adrenal abnormality. No focal renal abnormality. No stones or hydronephrosis. Urinary bladder is unremarkable. Stomach/Bowel: Gastrostomy tube in place within the stomach. No evidence of bowel obstruction. Large stool burden throughout the colon. Mild gaseous distention of the left colon. Vascular/Lymphatic: No evidence of aneurysm or adenopathy. Reproductive: Uterus and adnexa unremarkable.  No mass. Other: No free fluid or free air. Musculoskeletal: No acute bony abnormality. IMPRESSION: Gastrostomy tube within the stomach. No visible complicating feature. Large stool burden in the colon. Mild gaseous distention of left colon. No evidence of bowel obstruction. Electronically Signed   By: KRolm BaptiseM.D.   On: 01/02/2022 18:35   DG Chest Portable 1 View  Result Date: 01/02/2022 CLINICAL DATA:  Cough, fever EXAM: PORTABLE CHEST 1 VIEW COMPARISON:  03/15/2021 FINDINGS: Tracheostomy tube in place. Normal heart size. No focal airspace consolidation, pleural effusion, or pneumothorax. IMPRESSION: No active disease. Electronically Signed   By: NDavina PokeD.O.   On: 01/02/2022 14:49    Procedures Procedures    CRITICAL CARE Performed by: CGwenyth AllegraTegeler Total critical care time: 40 minutes Critical care  time was exclusive of separately billable procedures and treating other patients. Critical care was necessary to treat or prevent imminent or life-threatening deterioration. Critical care was time spent personally by me on the following activities: development of treatment plan with patient and/or surrogate as well as nursing, discussions with consultants, evaluation of patient's response to treatment, examination of patient, obtaining history from patient or surrogate, ordering and performing treatments and interventions, ordering and review of laboratory studies, ordering and review of radiographic studies, pulse oximetry and re-evaluation of patient's condition.   Medications Ordered in ED Medications  lactated ringers infusion (has no administration in time range)  sodium chloride 0.9 % bolus 1,000 mL (has no administration in time range)  cefTRIAXone (ROCEPHIN) 1 g in sodium chloride 0.9 % 100 mL IVPB (has no administration in time range)  sodium chloride 0.9 % bolus 500 mL (0 mLs Intravenous Stopped 01/02/22 1628)  fentaNYL (SUBLIMAZE) injection 50 mcg (50 mcg Intravenous Given 01/02/22 1446)  iohexol (OMNIPAQUE) 300 MG/ML solution 100 mL (100 mLs Intravenous Contrast Given 01/02/22 1820)    ED Course/ Medical Decision Making/ A&P                           Medical Decision Making Amount and/or Complexity of Data Reviewed Labs: ordered. Radiology: ordered.  Risk Prescription drug management. Decision regarding hospitalization.   HJessyka Austriais a 21y.o. 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  According to family, patient is at her mental  status baseline but  has been grimacing with her abdominal distention and straining.  She is nonverbal and does not move her extremities due to her chronic encephalitis.  She has had fever for the last 2 days and is febrile on arrival and a rectal temp over 101.  She took Tylenol several hours ago.  Family says that she has had some increase in her cough with some productive sputum but denies hemoptysis.  She has also had episode of nausea and vomiting unclear if there is any aspiration.  She has had no trauma and is otherwise at her mental status baseline.  Family reports they tried to go to [facility] but was brought here instead.  On my initial exam, lungs have some coarseness but I do not see significant sputum on her trach.  Chest was nontender.  Abdomen did appear slightly tender with some grimacing with palpation.  I could appreciate some bowel sounds.  No drainage at the feeding tube site.  Family feels the abdomen is distended.  Patient did not move extremities.  She did not want to open her eyes but pupils are reactive.  Given the patient's fever, cough, and nausea, vomiting, constipation, and distention I am somewhat concerned that she could have either pneumonia versus bowel obstruction versus COVID versus other.  We will get chest x-ray and CT abdomen pelvis to further evaluate.  With the constipation starting since her feeding tube replacement 2 weeks ago and now no bowel movement several days which is very abnormal for her, and somewhat concerned.  We will get screening labs and give her some fluids.  She is tachycardic on arrival.  She is not tachypneic but is febrile.  Family Caryl Pina wants her to be admitted to [facility] if she has be admitted.  We will start her work-up here to rule out emergent surgical abnormality such as bowel obstruction and when her work-up is completed, we will have a shared decision-making conversation about destination.  7:32 PM Work-up continued to return.  CT scan  of the abdomen pelvis does not show evidence of acute obstruction but does show some large stool burden and some gas.  Suspect some degree of constipation.  In and out catheterization did show evidence of urinary tract infection and looked abnormal on gross inspection per nursing.  X-ray did not show pneumonia.  Clinically I do suspect patient has borderline sepsis that is now worsening given her vital signs from urinary tract infection.  I called and spoke with the patient's uncle who is a hospitalist in the [place] system [phone], and had a shared decision-making conversation.  As I suspect she is septic from UTI primarily with some constipation he agrees that she does not need to be admitted to [facility] and is amenable to admission here.  We will call unassigned team for admission for further management of sepsis from likely UTI source.  Spoke with pharmacy who reviewed previous cultures and agree with Rocephin at this time.  Awaiting medicine called for admission.         Final Clinical Impression(s) / ED Diagnoses Final diagnoses:  Lower urinary tract infectious disease  Sepsis, due to unspecified organism, unspecified whether acute organ dysfunction present ([place])  Constipation, unspecified constipation type    Clinical Impression: 1. Lower urinary tract infectious disease   2. Sepsis, due to unspecified organism, unspecified whether acute organ dysfunction present ([place])   3. Constipation, unspecified constipation type     Disposition: Admit  This note was prepared with assistance  of Systems analyst. Occasional wrong-word or sound-a-like substitutions may have occurred due to the inherent limitations of voice recognition software.     Klyde Banka, Gwenyth Allegra, MD 01/02/22 2022

## 2022-01-03 ENCOUNTER — Other Ambulatory Visit: Payer: Self-pay

## 2022-01-03 ENCOUNTER — Inpatient Hospital Stay (HOSPITAL_COMMUNITY): Payer: Medicaid Other

## 2022-01-03 DIAGNOSIS — K5900 Constipation, unspecified: Secondary | ICD-10-CM | POA: Diagnosis not present

## 2022-01-03 DIAGNOSIS — G0481 Other encephalitis and encephalomyelitis: Secondary | ICD-10-CM | POA: Diagnosis not present

## 2022-01-03 DIAGNOSIS — N319 Neuromuscular dysfunction of bladder, unspecified: Secondary | ICD-10-CM | POA: Diagnosis not present

## 2022-01-03 DIAGNOSIS — E039 Hypothyroidism, unspecified: Secondary | ICD-10-CM | POA: Diagnosis not present

## 2022-01-03 DIAGNOSIS — G40909 Epilepsy, unspecified, not intractable, without status epilepticus: Secondary | ICD-10-CM | POA: Diagnosis not present

## 2022-01-03 DIAGNOSIS — N3000 Acute cystitis without hematuria: Secondary | ICD-10-CM | POA: Diagnosis not present

## 2022-01-03 LAB — CBC WITH DIFFERENTIAL/PLATELET
Abs Immature Granulocytes: 0.04 10*3/uL (ref 0.00–0.07)
Basophils Absolute: 0 10*3/uL (ref 0.0–0.1)
Basophils Relative: 0 %
Eosinophils Absolute: 0 10*3/uL (ref 0.0–0.5)
Eosinophils Relative: 0 %
HCT: 35.4 % — ABNORMAL LOW (ref 36.0–46.0)
Hemoglobin: 10.6 g/dL — ABNORMAL LOW (ref 12.0–15.0)
Immature Granulocytes: 1 %
Lymphocytes Relative: 16 %
Lymphs Abs: 1.3 10*3/uL (ref 0.7–4.0)
MCH: 25.2 pg — ABNORMAL LOW (ref 26.0–34.0)
MCHC: 29.9 g/dL — ABNORMAL LOW (ref 30.0–36.0)
MCV: 84.1 fL (ref 80.0–100.0)
Monocytes Absolute: 1.3 10*3/uL — ABNORMAL HIGH (ref 0.1–1.0)
Monocytes Relative: 15 %
Neutro Abs: 5.8 10*3/uL (ref 1.7–7.7)
Neutrophils Relative %: 68 %
Platelets: 375 10*3/uL (ref 150–400)
RBC: 4.21 MIL/uL (ref 3.87–5.11)
RDW: 14.4 % (ref 11.5–15.5)
WBC: 8.5 10*3/uL (ref 4.0–10.5)
nRBC: 0 % (ref 0.0–0.2)

## 2022-01-03 LAB — COMPREHENSIVE METABOLIC PANEL
ALT: 20 U/L (ref 0–44)
AST: 40 U/L (ref 15–41)
Albumin: 3.4 g/dL — ABNORMAL LOW (ref 3.5–5.0)
Alkaline Phosphatase: 41 U/L (ref 38–126)
Anion gap: 13 (ref 5–15)
BUN: 36 mg/dL — ABNORMAL HIGH (ref 6–20)
CO2: 28 mmol/L (ref 22–32)
Calcium: 9.2 mg/dL (ref 8.9–10.3)
Chloride: 104 mmol/L (ref 98–111)
Creatinine, Ser: 0.76 mg/dL (ref 0.44–1.00)
GFR, Estimated: >60 mL/min (ref >60)
Glucose, Bld: 103 mg/dL — ABNORMAL HIGH (ref 70–99)
Potassium: 2.9 mmol/L — ABNORMAL LOW (ref 3.5–5.1)
Sodium: 145 mmol/L (ref 135–145)
Total Bilirubin: 0.6 mg/dL (ref 0.3–1.2)
Total Protein: 6.1 g/dL — ABNORMAL LOW (ref 6.5–8.1)

## 2022-01-03 LAB — BASIC METABOLIC PANEL
Anion gap: 15 (ref 5–15)
BUN: 20 mg/dL (ref 6–20)
CO2: 26 mmol/L (ref 22–32)
Calcium: 9.5 mg/dL (ref 8.9–10.3)
Chloride: 106 mmol/L (ref 98–111)
Creatinine, Ser: 0.58 mg/dL (ref 0.44–1.00)
GFR, Estimated: >60 mL/min (ref >60)
Glucose, Bld: 92 mg/dL (ref 70–99)
Potassium: 3.3 mmol/L — ABNORMAL LOW (ref 3.5–5.1)
Sodium: 147 mmol/L — ABNORMAL HIGH (ref 135–145)

## 2022-01-03 LAB — PROCALCITONIN: Procalcitonin: <0.1 ng/mL

## 2022-01-03 LAB — PROTIME-INR
INR: 1.1 (ref 0.8–1.2)
Prothrombin Time: 14.5 seconds (ref 11.4–15.2)

## 2022-01-03 LAB — MAGNESIUM: Magnesium: 2.2 mg/dL (ref 1.7–2.4)

## 2022-01-03 LAB — TSH: TSH: 0.415 u[IU]/mL (ref 0.350–4.500)

## 2022-01-03 LAB — LACTIC ACID, PLASMA: Lactic Acid, Venous: 4.5 mmol/L (ref 0.5–1.9)

## 2022-01-03 LAB — PHOSPHORUS
Phosphorus: 1.6 mg/dL — ABNORMAL LOW (ref 2.5–4.6)
Phosphorus: 4.5 mg/dL (ref 2.5–4.6)

## 2022-01-03 LAB — URINE CULTURE: Culture: NO GROWTH

## 2022-01-03 LAB — CK: Total CK: 977 U/L — ABNORMAL HIGH (ref 38–234)

## 2022-01-03 MED ORDER — LACTATED RINGERS IV SOLN: INTRAVENOUS | Status: DC

## 2022-01-03 MED ORDER — DEXTROSE 5 % IV SOLN
30.0000 mmol | Freq: Once | INTRAVENOUS | Status: AC
  Administered 2022-01-03: 30 mmol via INTRAVENOUS
  Filled 2022-01-03: qty 10

## 2022-01-03 MED ORDER — JEVITY 1.5 CAL/FIBER PO LIQD
1000.0000 mL | ORAL | Status: DC
  Administered 2022-01-03: 1000 mL
  Filled 2022-01-03: qty 1000

## 2022-01-03 MED ORDER — SENNOSIDES-DOCUSATE SODIUM 8.6-50 MG PO TABS
2.0000 | ORAL_TABLET | Freq: Two times a day (BID) | ORAL | Status: DC
  Administered 2022-01-03 – 2022-01-05 (×5): 2
  Filled 2022-01-03 (×5): qty 2

## 2022-01-03 MED ORDER — LEVOTHYROXINE SODIUM 100 MCG PO TABS
100.0000 ug | ORAL_TABLET | Freq: Every day | ORAL | Status: DC
Start: 2022-01-03 — End: 2022-01-06
  Administered 2022-01-03 – 2022-01-05 (×3): 100 ug
  Filled 2022-01-03 (×3): qty 1

## 2022-01-03 MED ORDER — POTASSIUM CHLORIDE 20 MEQ PO PACK
40.0000 meq | PACK | Freq: Four times a day (QID) | ORAL | Status: AC
  Administered 2022-01-03 (×2): 40 meq
  Filled 2022-01-03 (×2): qty 2

## 2022-01-03 MED ORDER — MINERAL OIL PO OIL
960.0000 mL | TOPICAL_OIL | Freq: Once | ORAL | Status: AC
Start: 2022-01-03 — End: 2022-01-03
  Administered 2022-01-03: 960 mL via RECTAL
  Filled 2022-01-03: qty 473

## 2022-01-03 MED ORDER — LACTULOSE 10 GM/15ML PO SOLN
30.0000 g | Freq: Two times a day (BID) | ORAL | Status: AC
  Administered 2022-01-03 (×2): 30 g
  Filled 2022-01-03 (×2): qty 60

## 2022-01-03 MED ORDER — LEVETIRACETAM IN NACL 1500 MG/100ML IV SOLN
750.0000 mg | Freq: Two times a day (BID) | INTRAVENOUS | Status: DC
  Administered 2022-01-03 – 2022-01-05 (×4): 750 mg via INTRAVENOUS
  Filled 2022-01-03 (×5): qty 100

## 2022-01-03 MED ORDER — POLYETHYLENE GLYCOL 3350 17 G PO PACK
17.0000 g | PACK | Freq: Two times a day (BID) | ORAL | Status: AC
  Administered 2022-01-03 – 2022-01-05 (×5): 17 g
  Filled 2022-01-03 (×5): qty 1

## 2022-01-03 MED ORDER — LORAZEPAM 2 MG/ML IJ SOLN
2.0000 mg | Freq: Once | INTRAMUSCULAR | Status: AC
  Administered 2022-01-03: 2 mg via INTRAVENOUS
  Filled 2022-01-03: qty 1

## 2022-01-03 MED ORDER — K PHOS MONO-SOD PHOS DI & MONO 155-852-130 MG PO TABS
500.0000 mg | ORAL_TABLET | Freq: Four times a day (QID) | ORAL | Status: AC
  Administered 2022-01-03 – 2022-01-04 (×3): 500 mg
  Filled 2022-01-03 (×3): qty 2

## 2022-01-03 NOTE — ED Notes (Signed)
This NT bladder scanned pt scanner only picked up 64m scanned twice.

## 2022-01-03 NOTE — ED Notes (Signed)
Pt had multiple large BM after enema. Pt cleaned and linen change performed.

## 2022-01-03 NOTE — Progress Notes (Signed)
LTM hook up. Patient monitored by [facility]. Patients test button was pushed. MRI compatible leads are on. Nurse was educated on how to remove for MRI.

## 2022-01-03 NOTE — Consult Note (Signed)
Neurology Consultation  Reason for Consult: fever, constipation, head turning to the left  Referring Physician: Dr. Velia Meyer  CC: none  History is obtained from:  family and chart   HPI: Jamie [name] is a 21 y.o. female with history of chronic encephalopathy, quadriplegia, neurogenic bladder, anti-NMDA receptor encephalitis, autoimmune hypothyroidism, chronic hypoxic respiratory failure and seizures presents from home with fever, constipation and UTI.  At baseline, patient is nonverbal, bedbound, trach and PEG dependent.  Patient's family states that she has recently not been opening her eyes as much as usual due to keratitis.  She has not been opening her left eye much for several weeks (end of July) and has not been opening her right eye for about one week. Just prior to discharge from the hospital at the end of July, she would open her eyes spontaneously and track caregivers around the room smile and laughed.  She has been persistently turning her head to the left for about 3-4 days for unclear reasons.  She has intermittent fine tremors of feet, but this is present at baseline.  Guarding her NMDA receptor encephalitis this was quite severe and she was treated with multiple immunosuppressive therapies including IVIG and plasmapheresis and 1 dose of Rituxan here at [facility] before being transferred for further care to [facility] where she received Cytoxan in addition to more Rituxan completing a 38-monthcourse of immunosuppressive treatment.  She has had seizures as well as movement disorders and encephalopathy.  Family notes that she has been constipated at home (straining to try to use the bathroom) and has been febrile for the past 2 to 3 days, found here to have a UTI.  She has been home for about a month and a half and they have been slowly weaning sedating medications including risperidone (discontinued 1 week ago) and clonazepam (discontinued 3 weeks ago).  They note she  has made some gains such as being able to sit up with support and hold her head up (previously her head which is flopped down).  Her urinary retention is managed by in and out catheterization by family  ROS: Unable to obtain due to altered mental status.   Past Medical History:  Diagnosis Date   Acquired autoimmune hypothyroidism    Dx 12/2014, TSH 110, FT4 0.3     Family History  Problem Relation Age of Onset   Healthy Mother    Healthy Father      Social History:   reports that she has never smoked. She has never used smokeless tobacco. She reports that she does not currently use alcohol. She reports that she does not currently use drugs.  Medications  Current Facility-Administered Medications:    acetaminophen (TYLENOL) tablet 650 mg, 650 mg, Oral, Q6H PRN **OR** acetaminophen (TYLENOL) suppository 650 mg, 650 mg, Rectal, Q6H PRN, Howerter, Justin B, DO, 650 mg at 01/03/22 0156   cefTRIAXone (ROCEPHIN) 1 g in sodium chloride 0.9 % 100 mL IVPB, 1 g, Intravenous, Q24H, Tegeler, CGwenyth Allegra MD, Stopped at 01/02/22 2009   ciprofloxacin (CILOXAN) 0.3 % ophthalmic solution 2 drop, 2 drop, Both Eyes, BID, Howerter, Justin B, DO   enoxaparin (LOVENOX) injection 40 mg, 40 mg, Subcutaneous, Q24H, Howerter, Justin B, DO, 40 mg at 01/03/22 1225   fentaNYL (SUBLIMAZE) injection 25 mcg, 25 mcg, Intravenous, Q2H PRN, Howerter, Justin B, DO   lactated ringers infusion, , Intravenous, Continuous, Elgergawy, DSilver Huguenin MD, Last Rate: 100 mL/hr at 01/03/22 1055, New Bag at 01/03/22 1055  lactulose (CHRONULAC) 10 GM/15ML solution 30 g, 30 g, Per Tube, BID, Elgergawy, Silver Huguenin, MD, 30 g at 01/03/22 0929   levETIRAcetam (KEPPRA) IVPB 1500 mg/ 100 mL premix, 1,500 mg, Intravenous, Q12H, Howerter, Justin B, DO, Stopped at 01/03/22 1108   levothyroxine (SYNTHROID) tablet 100 mcg, 100 mcg, Per Tube, Q0600, Elgergawy, Silver Huguenin, MD, 100 mcg at 01/03/22 0759   naloxone (NARCAN) injection 0.4 mg, 0.4 mg,  Intravenous, PRN, Howerter, Justin B, DO   ondansetron (ZOFRAN) injection 4 mg, 4 mg, Intravenous, Q6H PRN, Howerter, Justin B, DO   polyethylene glycol (MIRALAX / GLYCOLAX) packet 17 g, 17 g, Per Tube, BID, Elgergawy, Silver Huguenin, MD, 17 g at 01/03/22 0929   potassium chloride (KLOR-CON) packet 40 mEq, 40 mEq, Per Tube, Q6H, Elgergawy, Silver Huguenin, MD, 40 mEq at 01/03/22 0800   potassium PHOSPHATE 30 mmol in dextrose 5 % 500 mL infusion, 30 mmol, Intravenous, Once, Elgergawy, Silver Huguenin, MD, Last Rate: 85 mL/hr at 01/03/22 1050, 30 mmol at 01/03/22 1050   senna-docusate (Senokot-S) tablet 2 tablet, 2 tablet, Per Tube, BID, Elgergawy, Silver Huguenin, MD, 2 tablet at 01/03/22 0929   tenofovir (VIREAD) tablet 300 mg, 300 mg, Per Tube, Daily, Howerter, Justin B, DO, 300 mg at 01/03/22 1054   tobramycin-dexamethasone (TOBRADEX) ophthalmic suspension 1 drop, 1 drop, Both Eyes, Q6H, Howerter, Justin B, DO, 1 drop at 01/03/22 1225   Exam: Current vital signs: BP 111/67 (BP Location: Right Arm)   Pulse (!) 112   Temp 98.8 F (37.1 C) (Axillary)   Resp 18   Ht '5\' 3"'$  (1.6 m)   Wt 60.8 kg   SpO2 100%   BMI 23.74 kg/m  Vital signs in last 24 hours: Temp:  [98.8 F (37.1 C)-101.5 F (38.6 C)] 98.8 F (37.1 C) (09/11 1445) Pulse Rate:  [75-161] 112 (09/11 1445) Resp:  [12-28] 18 (09/11 1400) BP: (101-135)/(56-83) 111/67 (09/11 1445) SpO2:  [95 %-100 %] 100 % (09/11 1445) FiO2 (%):  [21 %] 21 % (09/11 1241) Weight:  [60.8 kg] 60.8 kg (09/11 1444)  GENERAL: Eyes closed in bed, trach and PEG present, in no acute distress Head: Normocephalic and atraumatic, without obvious abnormality, tracheostomy in place, head turned to the left EENT: Normal conjunctivae, moist mucous membranes, no OP obstruction LUNGS: Normal respiratory effort. Non-labored breathing on trach collar Extremities: warm, well perfused, without obvious deformity  NEURO:  Patient is nonverbal with eyes closed.  Will blink to eyelash brush.   Resists eye opening but did open eyes spontaneously at one point and look top the left.  Head remains turned to the left.  Does not follow commands or move extremities spontaneously.  Increased tone, likely paratonia, in bilateral upper extremities, intermittent fine tremor in bilateral feet.  Patient will intermittently straighten her back and attempt to sit up in bed.   Labs I have reviewed labs in epic and the results pertinent to this consultation are:   CBC    Component Value Date/Time   WBC 8.5 01/03/2022 0337   RBC 4.21 01/03/2022 0337   HGB 10.6 (L) 01/03/2022 0337   HCT 35.4 (L) 01/03/2022 0337   PLT 375 01/03/2022 0337   MCV 84.1 01/03/2022 0337   MCH 25.2 (L) 01/03/2022 0337   MCHC 29.9 (L) 01/03/2022 0337   RDW 14.4 01/03/2022 0337   LYMPHSABS 1.3 01/03/2022 0337   MONOABS 1.3 (H) 01/03/2022 0337   EOSABS 0.0 01/03/2022 0337   BASOSABS 0.0 01/03/2022 0337    CMP  Component Value Date/Time   NA 145 01/03/2022 0337   K 2.9 (L) 01/03/2022 0337   CL 104 01/03/2022 0337   CO2 28 01/03/2022 0337   GLUCOSE 103 (H) 01/03/2022 0337   BUN 36 (H) 01/03/2022 0337   CREATININE 0.76 01/03/2022 0337   CALCIUM 9.2 01/03/2022 0337   PROT 6.1 (L) 01/03/2022 0337   ALBUMIN 3.4 (L) 01/03/2022 0337   ALBUMIN 4.5 02/08/2021 1531   AST 40 01/03/2022 0337   ALT 20 01/03/2022 0337   ALKPHOS 41 01/03/2022 0337   BILITOT 0.6 01/03/2022 0337   GFRNONAA >60 01/03/2022 0337    Lipid Panel     Component Value Date/Time   CHOL 108 02/09/2021 0344   TRIG 235 (H) 03/16/2021 0301   HDL 40 (L) 02/09/2021 0344   CHOLHDL 2.7 02/09/2021 0344   VLDL 6 02/09/2021 0344   LDLCALC 62 02/09/2021 0344   UA was notable for trace leukocytes, rare bacteria, negative nitrites  Imaging I have reviewed the images obtained:  MRI examination of the brain pending  Assessment: 21 yo patient with history of anti-NMDA receptor encephalitis, trach/PEG, quadriplegia and seizures presented with UTI,  constipation and fever.  She is being treated for sepsis caused by UTI and has been treated for her constipation.  Patient's family states that for the last several days, she has been persistently turning her head to the left and has not been opening her eyes as much over the past few weeks.  She has also been stiffening her back and neck in what looks like an attempt to sit up.  She is being treated for keratitis.  At baseline, she is nonverbal and bedbound but will usually open her eyes spontaneously and track others around the room.  Given these changes from her baseline, it is reasonable to investigate for stroke or seizure with brain MRI and LTM EEG  Impression:Possible seizures in patient with history of encephalitis  Recommendations: - MRI brain w/wo contrast - LTM EEG with MRI compatible leads - Continue Keppra at home dose of 750 mg BID (dose adjusted from previously ordered 1500 twice daily) - Appreciate full medication reconciliation for pharmacy/primary team - Appreciate treatment of UTI and constipation per primary team and continued infectious work-up - Neurology will follow along   Pt seen by NP/Neuro and later by MD. Note/plan to be edited by MD as needed.  [place] , MSN, AGACNP-BC [facility] See Amion for schedule and pager information 01/03/2022 3:33 PM  Attending Neurologist's note:  I personally saw this patient, gathering history, performing a full neurologic examination, reviewing relevant labs, personally reviewing relevant imaging (no CNS imaging recently), and formulated the assessment and plan, adding the note above for completeness and clarity to accurately reflect my thoughts  On examination   General: No acute distress, chronically ill-appearing but well cared for patient HEENT: Generally keeps eyes closed, family reporting some keratitis which I cannot observe due to patient participation.  Tracheostomy in place Abdomen: Soft,  nontender (s/p multiple large bowel movements after enema earlier today) Extremities: No significant edema   Mental status: Opens eyes intermittently spontaneously.  Not clearly tracking.  Not following any commands.  No verbal output or attempt to verbalize.  She has head turn preference to the left and is extremely difficult to move her neck midline.  Although she does intermittently spontaneously move her head midline.  She strongly resists eye opening with Bell's phenomena preventing pupillary examination.  Family reports she  seems to have some photophobia since developing keratitis initially in the left eye at the end of July but now bilaterally with the right eye being most affected in the last 1 week Extremities: Paratonia in the bilateral upper extremities with some mild cogwheeling.  More paratonia than spasticity.  At least 4/5 throughout the upper extremities.  Lower extremities are with toes plantarflexed   Patient's reduced interactiveness may just be encephalopathy in the setting of her prior brain injury from NMDA encephalitis in the setting of acute febrile infection.  NMDA receptor encephalitis is typically monophasic illness and she has completed immunosuppressive treatment for the same.  However rarely can be recurrent and therefore we will obtain MRI brain with and without contrast to exclude new structural process and long-term EEG monitoring to confirm seizure control.  Note that her seizure threshold may be lowered in the setting of infection and she has recently had her Klonopin tapered which would also put her at increased risk of seizures.  However Keppra has been ordered at twice her normal recent home dose and we will revert to her home dose for now pending EEG.   Plan as documented above, edited to reflect my thoughts   Lesleigh Noe MD-PhD [facility] [phone]  Available 7 AM to 7 PM, outside these hours please contact Neurologist on call listed on AMION

## 2022-01-03 NOTE — ED Notes (Signed)
Breakfast order placed ?

## 2022-01-03 NOTE — Progress Notes (Addendum)
Was notified by techs about increased activity on EEG. EEG with L occipital fast activity, bilateral at times and does seem to spread. No clinical seizure activity noted by staff. Will give her '2mg'$  of Ativan and have the RN push EEG button.  Will have Dr. Hortense Ramal take a look at it too.  [place] Pager Number [phone]

## 2022-01-03 NOTE — Progress Notes (Addendum)
PROGRESS NOTE    Jamie [name]  ATF:[id number] DOB: May 31, 2000 DOA: 01/02/2022 PCP: Ok Edwards, MD    Chief Complaint  Patient presents with   Constipation   Fever    Brief Narrative:   Jamie [name] is a 21 y.o. female with medical history significant for chronic encephalopathy nonverbal at baseline, quadriplegia, chronic trach dependence, G-tube dependent, neurogenic bladder , chronic hypoxic respiratory failure on Continuous 5 L via trach collar, acquired hypothyroidism, anti-NMDA receptor encephalitis, seizures, who is admitted to [facility] for fever, with initial work-up suspicious for sepsis due to UTI   Assessment & Plan:   Principal Problem:   UTI (urinary tract infection) Active Problems:   Acquired hypothyroidism   Severe sepsis (HCC)   Lactic acidosis   Constipation   Hypokalemia   Hypercalcemia   Dehydration   AKI (acute kidney injury) ([place])   Neurogenic bladder   Chronic respiratory failure with hypoxia (HCC)   Seizure disorder (HCC)       Severe sepsis  -Sepsis present on admission, elevated lactic acid, fever, tachypneic, tachycardic  -So far only identifiable source is positive UA . -Continue with IV Rocephin . -Follow up blood cultures . -Chest x-ray with no acute finding, but may need to repeat in 1 to 2 days after appropriate hydration to see if any new evolving infiltrate .  Fever -Patient presents with fever 101.5,  so did request neurology input to assist if any source of fever related to her neurological  medication as she was recently stopped using Klonopin, Risperdal, and she ran out of scopolamine last week.   Constipation:  -Resolved with SMOG  enema  -Continue with laxatives   hypokalemia:  - relpeted  Dehydration:  -Continue with IV fluids, now her constipation has resolved she will be resumed on tube feed  Hypercalcemia:  -due to Dehydration, improved with IV fluids   Chronic quadriplegia, tracheostomy  dependent associated prophylaxis respiratory 5 L continuous trach collar, neurogenic bladder, G-tube dependent.   - Patient noted to be maintaining O2 sats in the high 90s on her baseline liters continuous trach collar.  -Monitor bladder scan, in and out as needed -Continue with supportive care.    hypothyroidism:  - continue with synthroid  Seizure disorder:  -continue with Keppar   Bilateral keratitis -Continue with home eyedrops  Hypophosphatemia -repleted , recheck in a.m.    DVT prophylaxis: Lovenox Code Status: Full Family Communication: D/W father at bedside Disposition:   Status is: Inpatient  Consultants:  neurology   Subjective:  It is nonverbal, cannot provide any complaints, father at bedside reports he had no bowel movements for a few days, reportedly had tried Dulcolax suppository at home with no success  Objective: Vitals:   01/03/22 1000 01/03/22 1130 01/03/22 1200 01/03/22 1230  BP: 102/75 (!) 104/56 104/68 123/81  Pulse: (!) 114 92 75   Resp: 12 (!) 23 (!) 21 15  Temp:      TempSrc:      SpO2: 99% 97% 99% 97%    Intake/Output Summary (Last 24 hours) at 01/03/2022 1326 Last data filed at 01/03/2022 1108 Gross per 24 hour  Intake 913.97 ml  Output 387 ml  Net 526.97 ml   There were no vitals filed for this visit.  Examination:  Patient is encephalopathic, nonverbal and does not follow any commands Tracheostomy present, no increased secretions or discharge RRR,No Gallops,Rubs or new Murmurs, No Parasternal Heave PEG present No Cyanosis, Clubbing or edema, No new Rash  or bruise    Patient was seen and examined with the presence of father at bedside, and her RN [place]: I have personally reviewed following labs and imaging studies  CBC: Recent Labs  Lab 01/02/22 1430 01/03/22 0337  WBC 10.4 8.5  NEUTROABS 7.7 5.8  HGB 14.3 10.6*  HCT 46.5* 35.4*  MCV 82.7 84.1  PLT 525* 361    Basic Metabolic Panel: Recent  Labs  Lab 01/02/22 1430 01/03/22 0337  NA 142 145  K 2.9* 2.9*  CL 89* 104  CO2 33* 28  GLUCOSE 133* 103*  BUN 45* 36*  CREATININE 0.85 0.76  CALCIUM 10.7* 9.2  MG  --  2.2  PHOS  --  1.6*    GFR: CrCl cannot be calculated (Unknown ideal weight.).  Liver Function Tests: Recent Labs  Lab 01/02/22 1430 01/03/22 0337  AST 51* 40  ALT 27 20  ALKPHOS 58 41  BILITOT 0.7 0.6  PROT 8.1 6.1*  ALBUMIN 4.5 3.4*    CBG: No results for input(s): "GLUCAP" in the last 168 hours.   Recent Results (from the past 240 hour(s))  Blood culture (routine x 2)     Status: None (Preliminary result)   Collection Time: 01/02/22  2:13 PM   Specimen: BLOOD RIGHT HAND  Result Value Ref Range Status   Specimen Description BLOOD RIGHT HAND  Final   Special Requests   Final    BOTTLES DRAWN AEROBIC AND ANAEROBIC Blood Culture adequate volume   Culture   Final    NO GROWTH < 24 HOURS Performed at [facility], [address] [place], [place] [address]    Report Status PENDING  Incomplete  Urine Culture     Status: None   Collection Time: 01/02/22  2:15 PM   Specimen: Urine, Clean Catch  Result Value Ref Range Status   Specimen Description URINE, CLEAN CATCH  Final   Special Requests NONE  Final   Culture   Final    NO GROWTH Performed at [facility], [place] [address] [place], [place] [address]    Report Status 01/03/2022 FINAL  Final  SARS Coronavirus 2 by RT PCR (hospital order, performed in [facility] hospital lab) *cepheid single result test* Anterior Nasal Swab     Status: None   Collection Time: 01/02/22  2:50 PM   Specimen: Anterior Nasal Swab  Result Value Ref Range Status   SARS Coronavirus 2 by RT PCR NEGATIVE NEGATIVE Final    Comment: (NOTE) SARS-CoV-2 target nucleic acids are NOT DETECTED.  The SARS-CoV-2 RNA is generally detectable in upper and lower respiratory specimens during the acute phase of infection. The lowest concentration of SARS-CoV-2 viral  copies this assay can detect is 250 copies / mL. A negative result does not preclude SARS-CoV-2 infection and should not be used as the sole basis for treatment or other patient management decisions.  A negative result may occur with improper specimen collection / handling, submission of specimen other than nasopharyngeal swab, presence of viral mutation(s) within the areas targeted by this assay, and inadequate number of viral copies (<250 copies / mL). A negative result must be combined with clinical observations, patient history, and epidemiological information.  Fact Sheet for Patients:   https://www.patel.info/  Fact Sheet for Healthcare Providers: https://hall.com/  This test is not yet approved or  cleared by the [place] FDA and has been authorized for detection and/or diagnosis of SARS-CoV-2 by FDA  under an Emergency Use Authorization (EUA).  This EUA will remain in effect (meaning this test can be used) for the duration of the COVID-19 declaration under Section 564(b)(1) of the Act, 21 U.S.C. section 360bbb-3(b)(1), unless the authorization is terminated or revoked sooner.  Performed at [facility], [place] [address], [place], [place] [address]   Blood culture (routine x 2)     Status: None (Preliminary result)   Collection Time: 01/02/22  7:00 PM   Specimen: BLOOD  Result Value Ref Range Status   Specimen Description BLOOD SITE NOT SPECIFIED  Final   Special Requests   Final    BOTTLES DRAWN AEROBIC AND ANAEROBIC Blood Culture adequate volume   Culture   Final    NO GROWTH < 12 HOURS Performed at [facility], [place] [address], [place], [place] [address]    Report Status PENDING  Incomplete         Radiology Studies: CT ABDOMEN PELVIS W CONTRAST  Result Date: 01/02/2022 CLINICAL DATA:  Abdominal pain, distention. Bowel obstruction suspected. EXAM: CT ABDOMEN AND PELVIS WITH CONTRAST TECHNIQUE:  Multidetector CT imaging of the abdomen and pelvis was performed using the standard protocol following bolus administration of intravenous contrast. RADIATION DOSE REDUCTION: This exam was performed according to the departmental dose-optimization program which includes automated exposure control, adjustment of the mA and/or kV according to patient size and/or use of iterative reconstruction technique. CONTRAST:  123m OMNIPAQUE IOHEXOL 300 MG/ML  SOLN COMPARISON:  02/13/2021 FINDINGS: Lower chest: No acute abnormality Hepatobiliary: No focal hepatic abnormality. Gallbladder unremarkable. Pancreas: No focal abnormality or ductal dilatation. Spleen: No focal abnormality.  Normal size. Adrenals/Urinary Tract: No adrenal abnormality. No focal renal abnormality. No stones or hydronephrosis. Urinary bladder is unremarkable. Stomach/Bowel: Gastrostomy tube in place within the stomach. No evidence of bowel obstruction. Large stool burden throughout the colon. Mild gaseous distention of the left colon. Vascular/Lymphatic: No evidence of aneurysm or adenopathy. Reproductive: Uterus and adnexa unremarkable.  No mass. Other: No free fluid or free air. Musculoskeletal: No acute bony abnormality. IMPRESSION: Gastrostomy tube within the stomach. No visible complicating feature. Large stool burden in the colon. Mild gaseous distention of left colon. No evidence of bowel obstruction. Electronically Signed   By: KRolm BaptiseM.D.   On: 01/02/2022 18:35   DG Chest Portable 1 View  Result Date: 01/02/2022 CLINICAL DATA:  Cough, fever EXAM: PORTABLE CHEST 1 VIEW COMPARISON:  03/15/2021 FINDINGS: Tracheostomy tube in place. Normal heart size. No focal airspace consolidation, pleural effusion, or pneumothorax. IMPRESSION: No active disease. Electronically Signed   By: NDavina PokeD.O.   On: 01/02/2022 14:49        Scheduled Meds:  ciprofloxacin  2 drop Both Eyes BID   enoxaparin  40 mg Subcutaneous Q24H   lactulose  30 g  Per Tube BID   levothyroxine  100 mcg Per Tube Q0600   polyethylene glycol  17 g Per Tube BID   potassium chloride  40 mEq Per Tube Q6H   senna-docusate  2 tablet Per Tube BID   tenofovir  300 mg Per Tube Daily   tobramycin-dexamethasone  1 drop Both Eyes Q6H   Continuous Infusions:  cefTRIAXone (ROCEPHIN)  IV Stopped (01/02/22 2009)   lactated ringers 100 mL/hr at 01/03/22 1055   levETIRacetam Stopped (01/03/22 1108)   potassium PHOSPHATE IVPB (in mmol) 30 mmol (01/03/22 1050)     LOS: 1 day       DPhillips Climes MD [facility]   To contact the attending provider between 7A-7P or the covering provider during after hours 7P-7A, please log into the web site [email or web address] and access using universal [place] password for that web site. If you do not have the password, please call the hospital operator.  01/03/2022, 1:26 PM

## 2022-01-04 ENCOUNTER — Inpatient Hospital Stay (HOSPITAL_COMMUNITY): Payer: Medicaid Other

## 2022-01-04 DIAGNOSIS — T83518A Infection and inflammatory reaction due to other urinary catheter, initial encounter: Secondary | ICD-10-CM | POA: Diagnosis not present

## 2022-01-04 DIAGNOSIS — A419 Sepsis, unspecified organism: Secondary | ICD-10-CM | POA: Diagnosis not present

## 2022-01-04 DIAGNOSIS — E039 Hypothyroidism, unspecified: Secondary | ICD-10-CM | POA: Diagnosis not present

## 2022-01-04 DIAGNOSIS — E063 Autoimmune thyroiditis: Secondary | ICD-10-CM | POA: Diagnosis not present

## 2022-01-04 DIAGNOSIS — N179 Acute kidney failure, unspecified: Secondary | ICD-10-CM | POA: Diagnosis not present

## 2022-01-04 DIAGNOSIS — G40909 Epilepsy, unspecified, not intractable, without status epilepticus: Secondary | ICD-10-CM | POA: Diagnosis not present

## 2022-01-04 DIAGNOSIS — G0490 Encephalitis and encephalomyelitis, unspecified: Secondary | ICD-10-CM | POA: Diagnosis not present

## 2022-01-04 DIAGNOSIS — M7989 Other specified soft tissue disorders: Secondary | ICD-10-CM | POA: Diagnosis not present

## 2022-01-04 DIAGNOSIS — E872 Acidosis, unspecified: Secondary | ICD-10-CM | POA: Diagnosis not present

## 2022-01-04 DIAGNOSIS — J9611 Chronic respiratory failure with hypoxia: Secondary | ICD-10-CM | POA: Diagnosis not present

## 2022-01-04 DIAGNOSIS — G9341 Metabolic encephalopathy: Secondary | ICD-10-CM | POA: Diagnosis not present

## 2022-01-04 DIAGNOSIS — G8250 Quadriplegia, unspecified: Secondary | ICD-10-CM | POA: Diagnosis not present

## 2022-01-04 DIAGNOSIS — R6520 Severe sepsis without septic shock: Secondary | ICD-10-CM | POA: Diagnosis not present

## 2022-01-04 DIAGNOSIS — Z20822 Contact with and (suspected) exposure to covid-19: Secondary | ICD-10-CM | POA: Diagnosis not present

## 2022-01-04 DIAGNOSIS — E870 Hyperosmolality and hypernatremia: Secondary | ICD-10-CM

## 2022-01-04 DIAGNOSIS — D509 Iron deficiency anemia, unspecified: Secondary | ICD-10-CM | POA: Diagnosis not present

## 2022-01-04 DIAGNOSIS — N3000 Acute cystitis without hematuria: Secondary | ICD-10-CM | POA: Diagnosis not present

## 2022-01-04 LAB — BASIC METABOLIC PANEL
Anion gap: 8 (ref 5–15)
BUN: 15 mg/dL (ref 6–20)
CO2: 28 mmol/L (ref 22–32)
Calcium: 9 mg/dL (ref 8.9–10.3)
Chloride: 112 mmol/L — ABNORMAL HIGH (ref 98–111)
Creatinine, Ser: 0.44 mg/dL (ref 0.44–1.00)
GFR, Estimated: >60 mL/min (ref >60)
Glucose, Bld: 94 mg/dL (ref 70–99)
Potassium: 3 mmol/L — ABNORMAL LOW (ref 3.5–5.1)
Sodium: 148 mmol/L — ABNORMAL HIGH (ref 135–145)

## 2022-01-04 LAB — CBC
HCT: 32.8 % — ABNORMAL LOW (ref 36.0–46.0)
Hemoglobin: 9.5 g/dL — ABNORMAL LOW (ref 12.0–15.0)
MCH: 24.5 pg — ABNORMAL LOW (ref 26.0–34.0)
MCHC: 29 g/dL — ABNORMAL LOW (ref 30.0–36.0)
MCV: 84.8 fL (ref 80.0–100.0)
Platelets: 299 10*3/uL (ref 150–400)
RBC: 3.87 MIL/uL (ref 3.87–5.11)
RDW: 14.6 % (ref 11.5–15.5)
WBC: 5.5 10*3/uL (ref 4.0–10.5)
nRBC: 0 % (ref 0.0–0.2)

## 2022-01-04 LAB — GLUCOSE, CAPILLARY
Glucose-Capillary: 134 mg/dL — ABNORMAL HIGH (ref 70–99)
Glucose-Capillary: 82 mg/dL (ref 70–99)
Glucose-Capillary: 84 mg/dL (ref 70–99)
Glucose-Capillary: 87 mg/dL (ref 70–99)
Glucose-Capillary: 99 mg/dL (ref 70–99)

## 2022-01-04 LAB — PHOSPHORUS: Phosphorus: 4.1 mg/dL (ref 2.5–4.6)

## 2022-01-04 LAB — MAGNESIUM: Magnesium: 2.1 mg/dL (ref 1.7–2.4)

## 2022-01-04 MED ORDER — FREE WATER
200.0000 mL | Status: DC
  Administered 2022-01-04 – 2022-01-05 (×9): 200 mL

## 2022-01-04 MED ORDER — BACLOFEN 10 MG PO TABS
5.0000 mg | ORAL_TABLET | Freq: Two times a day (BID) | ORAL | Status: DC
  Administered 2022-01-04 – 2022-01-05 (×3): 5 mg
  Filled 2022-01-04 (×4): qty 1

## 2022-01-04 MED ORDER — BACLOFEN 5 MG PO TABS: 5.0000 mg | ORAL_TABLET | Freq: Two times a day (BID) | ORAL | Status: DC

## 2022-01-04 MED ORDER — JEVITY 1.5 CAL/FIBER PO LIQD
237.0000 mL | Freq: Every day | ORAL | Status: DC
  Administered 2022-01-04 – 2022-01-05 (×5): 237 mL
  Filled 2022-01-04 (×10): qty 237

## 2022-01-04 MED ORDER — GABAPENTIN 250 MG/5ML PO SOLN
100.0000 mg | Freq: Three times a day (TID) | ORAL | Status: DC
  Administered 2022-01-04 – 2022-01-05 (×4): 100 mg
  Filled 2022-01-04 (×6): qty 2

## 2022-01-04 MED ORDER — GADOBUTROL 1 MMOL/ML IV SOLN
5.0000 mL | Freq: Once | INTRAVENOUS | Status: AC | PRN
  Administered 2022-01-04: 5 mL via INTRAVENOUS

## 2022-01-04 MED ORDER — POTASSIUM CHLORIDE 20 MEQ PO PACK
40.0000 meq | PACK | Freq: Four times a day (QID) | ORAL | Status: AC
  Administered 2022-01-04 (×2): 40 meq
  Filled 2022-01-04 (×2): qty 2

## 2022-01-04 MED ORDER — PROSOURCE TF20 ENFIT COMPATIBL EN LIQD
60.0000 mL | Freq: Every day | ENTERAL | Status: DC
  Administered 2022-01-04 – 2022-01-05 (×2): 60 mL
  Filled 2022-01-04 (×2): qty 60

## 2022-01-04 NOTE — Plan of Care (Signed)

## 2022-01-04 NOTE — Progress Notes (Addendum)
Subjective: Per father at bedside, she has increased tone and was started on baclofen for.  States tone improves after some physical therapy.  Denies any other concerns.   ROS: Unable to obtain due to poor mental status  Examination  Vital signs in last 24 hours: Temp:  [97.8 F (36.6 C)-99.3 F (37.4 C)] 98.1 F (36.7 C) (09/12 0800) Pulse Rate:  [76-112] 76 (09/12 0400) Resp:  [15-22] 17 (09/12 0400) BP: (103-123)/(65-81) 103/71 (09/12 0400) SpO2:  [97 %-100 %] 100 % (09/12 0400) FiO2 (%):  [21 %-28 %] 28 % (09/12 1206) Weight:  [56.3 kg-60.8 kg] 56.3 kg (09/12 0500)  General: lying in bed, NAD Neuro: Barely opens eyes, did not track examiner in room, did not follow commands, increased tone in all 4 extremities with bilateral sustained ankle clonus.  Had an episode of head turning to the left with increase strength in left upper extremity father is in the room.  On exam, it appears more like spasticity and gradually I was able to relax patient's arm.  She also had increased tone in right upper extremity as well as bilateral lower extremities.  Basic Metabolic Panel: Recent Labs  Lab 01/02/22 1430 01/03/22 0337 01/03/22 1546 01/04/22 0238  NA 142 145 147* 148*  K 2.9* 2.9* 3.3* 3.0*  CL 89* 104 106 112*  CO2 33* '28 26 28  '$ GLUCOSE 133* 103* 92 94  BUN 45* 36* 20 15  CREATININE 0.85 0.76 0.58 0.44  CALCIUM 10.7* 9.2 9.5 9.0  MG  --  2.2  --  2.1  PHOS  --  1.6* 4.5 4.1    CBC: Recent Labs  Lab 01/02/22 1430 01/03/22 0337 01/04/22 0238  WBC 10.4 8.5 5.5  NEUTROABS 7.7 5.8  --   HGB 14.3 10.6* 9.5*  HCT 46.5* 35.4* 32.8*  MCV 82.7 84.1 84.8  PLT 525* 375 299     Coagulation Studies: Recent Labs    01/03/22 0337  LABPROT 14.5  INR 1.1    Imaging No new imaging overnight  ASSESSMENT AND PLAN: 21 year old female with history of anti-NMDA receptor encephalitis status post trach/PEG, quadriplegic, minimally interactive (opens eyes and maybe track people in  room at times per family), seizures, dyskinesias, prior neuro storming who is admitted with fever, constipation.   Fever Constipation Chronic encephalopathy post anti-NMDA encephalitis Hypokalemia Hypernatremia Microcytic anemia Hypothyroidism Rhabdomyolysis Lactic acidosis Spasticity -No leukocytosis, no known urine culture, blood culture.  No fever overnight -Fever could have been due to neuro storming as patient is also tachycardic in the 180s  Recommendations -LTM EEG did not show any evidence of seizures overnight.  Therefore we will continue Keppra 750 mg twice daily.  Hesitant to change medications unless definite evidence of seizures on EEG because patient has been on multiple AEDs in the past which contributed to sedation. -Recommend resuming home baclofen and gabapentin -Patient is being recently weaned off of Klonopin.  If heart rate does not improve or fever recurs, can consider resuming Klonopin for now -Once patient is stable, would recommend follow-up with patient's primary neurologist Dr. Shaaron Adler at [facility] to use as needed IV Ativan 0.5 to 1 mg for clinical seizure-like activity.  Please note notify neurology if this is administered. -Management of rest of comorbidities per primary team -Discussed plan with father at bedside as well as Dr. Waldron Labs  I have spent a total of  36  minutes with the patient reviewing hospital notes,  test results, labs and examining the patient  as well as establishing an assessment and plan.  > 50% of time was spent in direct patient care.     Zeb Comfort Epilepsy [facility] For questions after 5pm please refer to AMION to reach the Neurologist on call

## 2022-01-04 NOTE — Procedures (Addendum)
Patient Name: Skai Lickteig  MRN: [id number]  Epilepsy Attending: Lora Havens  Referring Physician/Provider: Katy Apo, NP  Duration: 01/03/2022 1702 to 01/04/2022 1702  Patient history: 21 year old female with anti-NMDA receptor encephalitis, status post trach PEG, quadriplegic and minimally responsive state admitted for UTI.  Now with worsening mental status.  EEG to evaluate for seizure.  Level of alertness: awake, asleep  AEDs during EEG study: Keppra  Technical aspects: This EEG study was done with scalp electrodes positioned according to the 10-20 International system of electrode placement. Electrical activity was reviewed with band pass filter of 1-'70Hz'$ , sensitivity of 7 uV/mm, display speed of 31m/sec with a '60Hz'$  notched filter applied as appropriate. EEG data were recorded continuously and digitally stored.  Video monitoring was available and reviewed as appropriate.  Description: The posterior dominant rhythm consists of 9-10 Hz activity of moderate voltage (25-35 uV) seen predominantly in posterior head regions, symmetric and reactive to eye opening and eye closing. Sleep was characterized by vertex waves, sleep spindles (12 to 14 Hz), maximal frontocentral region.    EEG showed continuous generalized and maximal bilateral posterior quadrant 5 to 7 Hz theta slowing admixed with intermittent generalized, at times rhythmic sharply contoured to 3 Hz delta slowing. Hyperventilation and photic stimulation were not performed.   At times, patient was noted to have episodes of rapid eye blinking.  Concomitant EEG before, during and after the event did not show any EEG changes suggest seizure.   At times, patient was noted to have left upper extremity tremor-like movements.   Concomitant EEG before, during and after the event did not show any EEG changes suggest seizure.   ABNORMALITY -Continuous slow, generalized and maximal bilateral posterior quadrant -Intermittent  rhythmic delta slow, generalized  IMPRESSION: This study is suggestive of cortical dysfunction in bilateral posterior quadrant.  Additional there is moderate diffuse encephalopathy, nonspecific etiology. No seizures or epileptiform discharges were seen throughout the recording.  At times, patient was noted to have episodes of rapid eye blinking without concomitant EEG change.  These episodes are most likely not epileptic.  At times, patient was noted to have episodes of left upper extremity tremor-like movements without concomitant EEG change.  These episodes are most likely not epileptic.   Didier Brandenburg OBarbra Sarks

## 2022-01-04 NOTE — Progress Notes (Signed)
LTM maint complete - no skin breakdown [facility] monitored, Event button test confirmed by [facility]. ? ?

## 2022-01-04 NOTE — Progress Notes (Signed)
PROGRESS NOTE    Jamie [name]  ZDG:[id number] DOB: Nov 09, 2000 DOA: 01/02/2022 PCP: Ok Edwards, MD    Chief Complaint  Patient presents with   Constipation   Fever    Brief Narrative:   Jamie [name] is a 21 y.o. 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 for fever, with initial work-up suspicious for sepsis due to UTI   Assessment & Plan:   Principal Problem:   UTI (urinary tract infection) Active Problems:   Acquired hypothyroidism   Severe sepsis (HCC)   Lactic acidosis   Constipation   Hypokalemia   Hypercalcemia   Dehydration   AKI (acute kidney injury) ([place])   Neurogenic bladder   Chronic respiratory failure with hypoxia (HCC)   Seizure disorder (HCC)       Severe sepsis  -Sepsis present on admission, elevated lactic acid, fever, tachypneic, tachycardic  -So far only identifiable source is positive UA . -Continue with IV Rocephin .  anti-NMDA receptor encephalitis status post trach/PEG, quadriplegic, minimally interactive (opens eyes and maybe track people in room at times per family), seizures, dyskinesias -She presents with his increased spasticity, simply started on baclofen by her primary neurologist at [facility], resume home dose baclofen and gabapentin. -LTM EEG did not show any evidence of seizures overnight, so continue with Keppra 750 mg twice daily -Recently had her Risperdal, heparin discontinued, continue to monitor but if fever reoccurs consider resuming Klonopin per neuro recommendations.  Constipation:  -Resolved with SMOG  enema  -Continue with laxatives   hypokalemia:  - relpeted  Dehydration:  -Continue with IV  fluids  Hypernatremia -Will start on free water via PEG tube  Hypercalcemia:  -due to Dehydration, improved with IV fluids    hypothyroidism:  - continue with synthroid  Seizure disorder:  -continue with Keppar   Bilateral keratitis -Continue with home eyedrops  Hypophosphatemia -repleted    DVT prophylaxis: Lovenox Code Status: Full Family Communication: D/W father at bedside daily Disposition:   Status is: Inpatient  Consultants:  neurology   Subjective:  At bedside, reported patient had increased tone, Tmax 100.3 over last 24 hours  Objective: Vitals:   01/04/22 0331 01/04/22 0400 01/04/22 0500 01/04/22 0800  BP:  103/71    Pulse: 83 76    Resp: 19 17    Temp:  97.8 F (36.6 C)  98.1 F (36.7 C)  TempSrc:  Axillary  Axillary  SpO2: 100% 100%    Weight:   56.3 kg   Height:        Intake/Output Summary (Last 24 hours) at 01/04/2022 1528 Last data filed at 01/04/2022 0645 Gross per 24 hour  Intake 1056.42 ml  Output 150 ml  Net 906.42 ml   Filed Weights   01/03/22 1444 01/04/22 0500  Weight: 60.8 kg 56.3 kg    Examination:  Patient is encephalopathic, obtunded, nonverbal, does not follow any commands  Tracheostomy present, no increased secretions  Regular rate and rhythm, no rubs or gallops  PEG present, abdomen soft  Increased tone in all extremities.    Patient was seen and examined with father at bedside, Dr. Hortense Ramal was present as well,    Data Reviewed: I have personally reviewed following labs and imaging studies  CBC: Recent Labs  Lab 01/02/22 1430  01/03/22 0337 01/04/22 0238  WBC 10.4 8.5 5.5  NEUTROABS 7.7 5.8  --   HGB 14.3 10.6* 9.5*  HCT 46.5* 35.4* 32.8*  MCV 82.7 84.1 84.8  PLT 525* 375 604    Basic Metabolic Panel: Recent Labs  Lab 01/02/22 1430 01/03/22 0337 01/03/22 1546 01/04/22 0238  NA 142 145 147* 148*  K 2.9* 2.9* 3.3* 3.0*  CL 89* 104 106 112*  CO2 33* '28 26 28  '$ GLUCOSE 133* 103* 92 94  BUN 45*  36* 20 15  CREATININE 0.85 0.76 0.58 0.44  CALCIUM 10.7* 9.2 9.5 9.0  MG  --  2.2  --  2.1  PHOS  --  1.6* 4.5 4.1    GFR: Estimated Creatinine Clearance: 92.8 mL/min (by C-G formula based on SCr of 0.44 mg/dL).  Liver Function Tests: Recent Labs  Lab 01/02/22 1430 01/03/22 0337  AST 51* 40  ALT 27 20  ALKPHOS 58 41  BILITOT 0.7 0.6  PROT 8.1 6.1*  ALBUMIN 4.5 3.4*    CBG: Recent Labs  Lab 01/04/22 0006 01/04/22 0622  GLUCAP 84 99     Recent Results (from the past 240 hour(s))  Blood culture (routine x 2)     Status: None (Preliminary result)   Collection Time: 01/02/22  2:13 PM   Specimen: BLOOD RIGHT HAND  Result Value Ref Range Status   Specimen Description BLOOD RIGHT HAND  Final   Special Requests   Final    BOTTLES DRAWN AEROBIC AND ANAEROBIC Blood Culture adequate volume   Culture   Final    NO GROWTH 2 DAYS Performed at [facility], [place] [address], [place], [place] [address]    Report Status PENDING  Incomplete  Urine Culture     Status: None   Collection Time: 01/02/22  2:15 PM   Specimen: Urine, Clean Catch  Result Value Ref Range Status   Specimen Description URINE, CLEAN CATCH  Final   Special Requests NONE  Final   Culture   Final    NO GROWTH Performed at [facility], [place] [address], [place], [place] [address]    Report Status 01/03/2022 FINAL  Final  SARS Coronavirus 2 by RT PCR (hospital order, performed in [facility] hospital lab) *cepheid single result test* Anterior Nasal Swab     Status: None   Collection Time: 01/02/22  2:50 PM   Specimen: Anterior Nasal Swab  Result Value Ref Range Status   SARS Coronavirus 2 by RT PCR NEGATIVE NEGATIVE Final    Comment: (NOTE) SARS-CoV-2 target nucleic acids are NOT DETECTED.  The SARS-CoV-2 RNA is generally detectable in upper and lower respiratory specimens during the acute phase of infection. The lowest concentration of SARS-CoV-2 viral copies this assay can detect is  250 copies / mL. A negative result does not preclude SARS-CoV-2 infection and should not be used as the sole basis for treatment or other patient management decisions.  A negative result may occur with improper specimen collection / handling, submission of specimen other than nasopharyngeal swab, presence of viral mutation(s) within the areas targeted by this assay, and inadequate number of viral copies (<250 copies / mL). A negative result must be combined with clinical observations, patient history, and epidemiological information.  Fact Sheet for Patients:   https://www.patel.info/  Fact Sheet for Healthcare Providers: https://hall.com/  This test is not yet approved or  cleared by the [place] FDA and has been authorized for detection and/or diagnosis of  SARS-CoV-2 by FDA under an Emergency Use Authorization (EUA).  This EUA will remain in effect (meaning this test can be used) for the duration of the COVID-19 declaration under Section 564(b)(1) of the Act, 21 U.S.C. section 360bbb-3(b)(1), unless the authorization is terminated or revoked sooner.  Performed at [facility], [place] [address], [place], [place] [address]   Blood culture (routine x 2)     Status: None (Preliminary result)   Collection Time: 01/02/22  7:00 PM   Specimen: BLOOD  Result Value Ref Range Status   Specimen Description BLOOD SITE NOT SPECIFIED  Final   Special Requests   Final    BOTTLES DRAWN AEROBIC AND ANAEROBIC Blood Culture adequate volume   Culture   Final    NO GROWTH 2 DAYS Performed at [facility], [place] [address] [place], [place] [address]    Report Status PENDING  Incomplete         Radiology Studies: Overnight EEG with video  Result Date: 01/04/2022 Lora Havens, MD     01/04/2022 10:47 AM Patient Name: Jamie [name] MRN: [id number] Epilepsy Attending: Lora Havens Referring Physician/Provider: Katy Apo, NP Duration: 01/03/2022 1702 to 03/2022 1045 Patient history: 21 year old female with anti-NMDA receptor encephalitis, status post trach PEG, quadriplegic and minimally responsive state admitted for UTI.  Now with worsening mental status.  EEG to evaluate for seizure. Level of alertness: awake, asleep AEDs during EEG study: Keppra Technical aspects: This EEG study was done with scalp electrodes positioned according to the 10-20 International system of electrode placement. Electrical activity was reviewed with band pass filter of 1-'70Hz'$ , sensitivity of 7 uV/mm, display speed of 11m/sec with a '60Hz'$  notched filter applied as appropriate. EEG data were recorded continuously and digitally stored.  Video monitoring was available and reviewed as appropriate. Description: The posterior dominant rhythm consists of 9-10 Hz activity of moderate voltage (25-35 uV) seen predominantly in posterior head regions, symmetric and reactive to eye opening and eye closing. Sleep was characterized by vertex waves, sleep spindles (12 to 14 Hz), maximal frontocentral region.    EEG showed continuous generalized and maximal bilateral posterior quadrant 5 to 7 Hz theta slowing admixed with intermittent generalized, at times rhythmic sharply contoured to 3 Hz delta slowing. Hyperventilation and photic stimulation were not performed. At times, patient was noted to have episodes of rapid eye blinking.  Concomitant EEG before, during and after the event did not show any EEG changes suggest seizure. At times, patient was noted to have left upper extremity tremor-like movements.   Concomitant EEG before, during and after the event did not show any EEG changes suggest seizure. ABNORMALITY -Continuous slow, generalized and maximal bilateral posterior quadrant -Intermittent rhythmic delta slow, generalized IMPRESSION: This study is suggestive of moderate diffuse encephalopathy, septic etiology. No seizures or epileptiform discharges were  seen throughout the recording. At times, patient was noted to have episodes of rapid eye blinking without concomitant EEG change.  These episodes are most likely not epileptic. At times, patient was noted to have episodes of left upper extremity tremor-like movements without concomitant EEG change.  These episodes are most likely not epileptic. PLora Havens  MR BRAIN W WO CONTRAST  Result Date: 01/04/2022 CLINICAL DATA:  History of chronic encephalopathy, quadriplegia, anti NMDA receptor encephalitis presents with fever. EXAM: MRI HEAD WITHOUT AND WITH CONTRAST TECHNIQUE: Multiplanar, multiecho pulse sequences of the brain and surrounding structures were obtained without and with intravenous contrast.  CONTRAST:  34m GADAVIST GADOBUTROL 1 MMOL/ML IV SOLN COMPARISON:  CT head 03/06/2021, brain MRI 03/01/2021 FINDINGS: Brain: There is no acute intracranial hemorrhage, extra-axial fluid collection, or acute infarct. There is mild global parenchymal volume loss which is greater than expected for age and increased since 2022. There is corresponding new slight prominence of the ventricular system and extra-axial CSF spaces. Gray-white differentiation is preserved. Parenchymal signal is normal. There is no hippocampal signal abnormality. There is no abnormal enhancement. There is no mass lesion. There is no mass effect or midline shift. Vascular: Normal flow voids. Skull and upper cervical spine: Normal marrow signal. Sinuses/Orbits: The paranasal sinuses are clear. The globes and orbits are unremarkable. Other: None. IMPRESSION: 1. No acute intracranial pathology. 2. Mild global parenchymal volume loss is greater than expected for age and new since 2022. Electronically Signed   By: PValetta MoleM.D.   On: 01/04/2022 09:50   CT ABDOMEN PELVIS W CONTRAST  Result Date: 01/02/2022 CLINICAL DATA:  Abdominal pain, distention. Bowel obstruction suspected. EXAM: CT ABDOMEN AND PELVIS WITH CONTRAST TECHNIQUE:  Multidetector CT imaging of the abdomen and pelvis was performed using the standard protocol following bolus administration of intravenous contrast. RADIATION DOSE REDUCTION: This exam was performed according to the departmental dose-optimization program which includes automated exposure control, adjustment of the mA and/or kV according to patient size and/or use of iterative reconstruction technique. CONTRAST:  1071mOMNIPAQUE IOHEXOL 300 MG/ML  SOLN COMPARISON:  02/13/2021 FINDINGS: Lower chest: No acute abnormality Hepatobiliary: No focal hepatic abnormality. Gallbladder unremarkable. Pancreas: No focal abnormality or ductal dilatation. Spleen: No focal abnormality.  Normal size. Adrenals/Urinary Tract: No adrenal abnormality. No focal renal abnormality. No stones or hydronephrosis. Urinary bladder is unremarkable. Stomach/Bowel: Gastrostomy tube in place within the stomach. No evidence of bowel obstruction. Large stool burden throughout the colon. Mild gaseous distention of the left colon. Vascular/Lymphatic: No evidence of aneurysm or adenopathy. Reproductive: Uterus and adnexa unremarkable.  No mass. Other: No free fluid or free air. Musculoskeletal: No acute bony abnormality. IMPRESSION: Gastrostomy tube within the stomach. No visible complicating feature. Large stool burden in the colon. Mild gaseous distention of left colon. No evidence of bowel obstruction. Electronically Signed   By: KeRolm Baptise.D.   On: 01/02/2022 18:35        Scheduled Meds:  baclofen  5 mg Per Tube BID   ciprofloxacin  2 drop Both Eyes BID   enoxaparin  40 mg Subcutaneous Q24H   feeding supplement (JEVITY 1.5 CAL/FIBER)  237 mL Per Tube 5 X Daily   feeding supplement (PROSource TF20)  60 mL Per Tube Daily   free water  200 mL Per Tube Q4H   gabapentin  100 mg Per Tube Q8H   levothyroxine  100 mcg Per Tube Q0600   polyethylene glycol  17 g Per Tube BID   senna-docusate  2 tablet Per Tube BID   tenofovir  300 mg Per  Tube Daily   tobramycin-dexamethasone  1 drop Both Eyes Q6H   Continuous Infusions:  cefTRIAXone (ROCEPHIN)  IV Stopped (01/03/22 2001)   lactated ringers 100 mL/hr at 01/04/22 0500   levETIRacetam 750 mg (01/04/22 1006)     LOS: 2 days       DaPhillips ClimesMD [facility]   To contact the attending provider between 7A-7P or the covering provider during after hours 7P-7A, please log into the web site [email or web address] and access using universal [place] password for that web site. If you  do not have the password, please call the hospital operator.  01/04/2022, 3:28 PM

## 2022-01-04 NOTE — Progress Notes (Signed)
vLTM maintenance   all impedances below 10k  no skin breakdown noted at all skin sites

## 2022-01-04 NOTE — TOC Initial Note (Signed)
Transition of Care [facility]) - Initial/Assessment Note    Patient Details  Name: Jamie [name] MRN: [id number] Date of Birth: [birth year]  Transition of Care [facility]) CM/SW Contact:    Ninfa Meeker, RN Phone Number: 01/04/2022, 12:10 PM  Clinical Narrative:                 Transition of Care screening Note:  Transition of Care Department [facility]) has reviewed patient and no TOC needs have been identified at this time. We will continue to monitor patient advancement through Interdisciplinary progressions. If new patient transition needs arise, please place a consult.         Patient Goals and CMS Choice        Expected Discharge Plan and Services                                                Prior Living Arrangements/Services                       Activities of Daily Living      Permission Sought/Granted                  Emotional Assessment              Admission diagnosis:  Lower urinary tract infectious disease [N39.0] UTI (urinary tract infection) [N39.0] Constipation, unspecified constipation type [K59.00] Sepsis, due to unspecified organism, unspecified whether acute organ dysfunction present [facility]) [A41.9] Patient Active Problem List   Diagnosis Date Noted   UTI (urinary tract infection) 01/02/2022   Severe sepsis ([place]) 01/02/2022   Lactic acidosis 01/02/2022   Constipation 01/02/2022   Hypokalemia 01/02/2022   Hypercalcemia 01/02/2022   Dehydration 01/02/2022   AKI (acute kidney injury) ([place]) 01/02/2022   Neurogenic bladder 01/02/2022   Chronic respiratory failure with hypoxia ([place]) 01/02/2022   Seizure disorder ([place]) 01/02/2022   Acute respiratory failure with hypoxia (HCC)    Status post tracheostomy ([place])    Encephalitis 02/12/2021   Catatonia associated with another mental disorder    Altered mental status 02/07/2021   Hashimoto's thyroiditis 06/29/2020   Irregular menses 06/29/2020   Psychosocial stressors  05/07/2015   Acquired hypothyroidism 01/21/2015   Goiter 01/20/2015   Failed vision screen 01/20/2015   PCP:  Ok Edwards, MD Pharmacy:   [facility] [place], [address] [place] [address] Phone: [phone] Fax: [phone]     Social Determinants of Health (SDOH) Interventions    Readmission Risk Interventions     No data to display

## 2022-01-04 NOTE — Progress Notes (Signed)
Initial Nutrition Assessment  DOCUMENTATION CODES:   Not applicable  INTERVENTION:   Transition to Bolus Tube feeds via PEG tube: 1 carton Jevity 1.5 - five times per day 50 mL free water flush before and after each bolus 60 mL ProSource TF20 - daily 125 mL free water flush q6h Provides 1856 kcal, 96 gm of protein, and 1700 mL total free water daily.   NUTRITION DIAGNOSIS:   Increased nutrient needs related to acute illness as evidenced by estimated needs.  GOAL:   Patient will meet greater than or equal to 90% of their needs  MONITOR:   Labs, Weight trends, I & O's, TF tolerance  REASON FOR ASSESSMENT:   Consult Assessment of nutrition requirement/status, Enteral/tube feeding initiation and management  ASSESSMENT:   21 y.o. female presented to the ED with fevers, new cough, 1 episode of emesis, and constipation. PMH includes quadriplegia, Hashimoto's thyroiditis, trach, PEG, anti-NMDA receptor encephalitis, and seizures. Pt admitted with sepsis secondary to UTI and constipation.   Met with pt father at bedside. Reports that she takes 5 cartons of Jevity 1.5 per day, with 75 mL free water before and after each bolus. Denies any recent episodes of emesis.  Per EMR, pt has had a 10% weight loss within the past year.   MD ok to switch back to home regimen.   Medications reviewed and include: Miralax, Potassium Chloride, Senokot-S, IV antibiotic Labs reviewed: Sodium 148, Potassium 3.0  Diet Order:   Diet Order     None       EDUCATION NEEDS:   Not appropriate for education at this time  Skin:  Skin Assessment: Reviewed RN Assessment  Last BM:  9/12  Height:   Ht Readings from Last 1 Encounters:  01/03/22 5' 3"  (1.6 m)    Weight:   Wt Readings from Last 1 Encounters:  01/04/22 56.3 kg   BMI:  Body mass index is 21.99 kg/m.  Estimated Nutritional Needs:  Kcal:  1700-1900 Protein:  85-100 grams Fluid:  >/= 1.7 L    Hermina Barters RD,  LDN Clinical Dietitian See [facility] for contact information.

## 2022-01-05 ENCOUNTER — Other Ambulatory Visit (HOSPITAL_COMMUNITY): Payer: Self-pay

## 2022-01-05 DIAGNOSIS — K5900 Constipation, unspecified: Secondary | ICD-10-CM | POA: Diagnosis not present

## 2022-01-05 DIAGNOSIS — J9611 Chronic respiratory failure with hypoxia: Secondary | ICD-10-CM | POA: Diagnosis not present

## 2022-01-05 DIAGNOSIS — G40909 Epilepsy, unspecified, not intractable, without status epilepticus: Secondary | ICD-10-CM | POA: Diagnosis not present

## 2022-01-05 DIAGNOSIS — E039 Hypothyroidism, unspecified: Secondary | ICD-10-CM | POA: Diagnosis not present

## 2022-01-05 LAB — BASIC METABOLIC PANEL
Anion gap: 5 (ref 5–15)
BUN: 17 mg/dL (ref 6–20)
CO2: 25 mmol/L (ref 22–32)
Calcium: 9.2 mg/dL (ref 8.9–10.3)
Chloride: 117 mmol/L — ABNORMAL HIGH (ref 98–111)
Creatinine, Ser: 0.45 mg/dL (ref 0.44–1.00)
GFR, Estimated: >60 mL/min (ref >60)
Glucose, Bld: 83 mg/dL (ref 70–99)
Potassium: 4.5 mmol/L (ref 3.5–5.1)
Sodium: 147 mmol/L — ABNORMAL HIGH (ref 135–145)

## 2022-01-05 LAB — GLUCOSE, CAPILLARY
Glucose-Capillary: 87 mg/dL (ref 70–99)
Glucose-Capillary: 84 mg/dL (ref 70–99)
Glucose-Capillary: 85 mg/dL (ref 70–99)
Glucose-Capillary: 129 mg/dL — ABNORMAL HIGH (ref 70–99)

## 2022-01-05 MED ORDER — SENNOSIDES-DOCUSATE SODIUM 8.6-50 MG PO TABS
2.0000 | ORAL_TABLET | Freq: Two times a day (BID) | ORAL | 0 refills | Status: AC
  Filled 2022-01-05: qty 120, 30d supply, fill #0

## 2022-01-05 MED ORDER — FREE WATER: 200.0000 mL | Freq: Four times a day (QID) | Status: AC

## 2022-01-05 MED ORDER — LEVETIRACETAM IN NACL 1500 MG/100ML IV SOLN: 750.0000 mg | Freq: Two times a day (BID) | INTRAVENOUS | Status: AC

## 2022-01-05 MED ORDER — CEFDINIR 250 MG/5ML PO SUSR
300.0000 mg | Freq: Two times a day (BID) | ORAL | 0 refills | Status: AC
  Filled 2022-01-05: qty 60, 5d supply, fill #0

## 2022-01-05 MED ORDER — POLYETHYLENE GLYCOL 3350 17 GM/SCOOP PO POWD
17.0000 g | Freq: Every day | ORAL | 0 refills | Status: AC
  Filled 2022-01-05: qty 238, 14d supply, fill #0

## 2022-01-05 NOTE — TOC Transition Note (Signed)
Transition of Care [facility]) - CM/SW Discharge Note   Patient Details  Name: Jamie [name] MRN: [id number] Date of Birth: [birth year]  [facility] CM/SW Contact:  Cyndi Bender, RN Phone Number: 01/05/2022, 12:24 PM   Clinical Narrative:     Spoke to father by phone in hospital room. Father states they have all needed equipment and supplies except for prescriptions which will be delivered to the room prior to discharge. Father states patient's mother is at home. Patient will need [employer] for transportation. Bedside RN state patient is receiving her last dose of antibiotics so should be ready in 1 hour. This RNCM will call [employer] for transportation. No other needs at this time.  Final next level of care: Home/Self Care Barriers to Discharge: Barriers Resolved   Patient Goals and CMS Choice        Discharge Placement                 Home with parents      Discharge Plan and Services                 Home                    Social Determinants of Health (SDOH) Interventions     Readmission Risk Interventions     No data to display

## 2022-01-05 NOTE — Procedures (Addendum)
Patient Name: Jamie [name]  MRN: [id number]  Epilepsy Attending: Lora Havens  Referring Physician/Provider: Katy Apo, NP  Duration: 01/04/2022 1702 to 01/05/2022 0959   Patient history: 21 year old female with anti-NMDA receptor encephalitis, status post trach PEG, quadriplegic and minimally responsive state admitted for UTI.  Now with worsening mental status.  EEG to evaluate for seizure.   Level of alertness: awake, asleep   AEDs during EEG study: Keppra, GBP   Technical aspects: This EEG study was done with scalp electrodes positioned according to the 10-20 International system of electrode placement. Electrical activity was reviewed with band pass filter of 1-'70Hz'$ , sensitivity of 7 uV/mm, display speed of 87m/sec with a '60Hz'$  notched filter applied as appropriate. EEG data were recorded continuously and digitally stored.  Video monitoring was available and reviewed as appropriate.   Description: The posterior dominant rhythm consists of 9-10 Hz activity of moderate voltage (25-35 uV) seen predominantly in posterior head regions, symmetric and reactive to eye opening and eye closing. Sleep was characterized by vertex waves, sleep spindles (12 to 14 Hz), maximal frontocentral region.    EEG showed continuous generalized and maximal bilateral posterior quadrant 5 to 7 Hz theta slowing admixed with intermittent generalized, at times rhythmic sharply contoured to 3 Hz delta slowing. Hyperventilation and photic stimulation were not performed.    ABNORMALITY -Continuous slow, generalized and maximal bilateral posterior quadrant -Intermittent rhythmic delta slow, generalized   IMPRESSION: This study is suggestive of cortical dysfunction bilateral posterior quadrant.  Additionally there is moderate diffuse encephalopathy, septic etiology. No seizures or definite epileptiform discharges were seen throughout the recording.    Jamie [name] OBarbra Sarks

## 2022-01-05 NOTE — Discharge Summary (Signed)
PATIENT DETAILS Name: Jamie [name] Age: 21 y.o. Sex: female Date of Birth: Apr 26, 2000 MRN: [id number]. Admitting Physician: Rhetta Mura, DO FIE:PPIRJ, Jerrel Ivory, MD  Admit Date: 01/02/2022 Discharge date: 01/05/2022  Recommendations for Outpatient Follow-up:  Follow up with PCP in 1-2 weeks Please obtain CMP/CBC in one week Please ensure follow-up with neurology.  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  Brief Hospital Course: Severe sepsis due to complicated UTI: Sepsis physiology has resolved-unfortunately urine cultures are nondiagnostic-however she has responded to Rocephin-hence will switch to cefdinir on discharge.  Chronic encephalopathy post anti-NMDA encephalitis: Followed closely by neurology-underwent LTM EEG monitoring-no seizures were seen-recommendations are to continue Keppra, baclofen/gabapentin.  And have patient follow-up with primary neurologist at [facility].  Constipation: Resolved with smog enema-continue laxatives on discharge.  Hypernatremia: Mild-improving with free water supplementation-continue free water via PEG tube and recheck electrolytes in 1 week.  Normocytic anemia: Chronic issue-stable for follow-up with outpatient PCP.  Hypothyroidism: Continue Synthroid  Seizure disorder: See above.  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  Nutrition Status: Nutrition Problem: Increased nutrient needs Etiology: acute illness Signs/Symptoms: estimated needs Interventions: Tube feeding    BMI: Estimated body mass index is 24.21 kg/m as calculated from the following:   Height as of this encounter: '5\' 3"'$   (1.6 m).   Weight as of this encounter: 62 kg.   Discharge Diagnoses:  Principal Problem:   UTI (urinary tract infection) Active Problems:   Acquired hypothyroidism   Severe sepsis (HCC)   Lactic acidosis   Constipation   Hypokalemia   Dehydration   Neurogenic bladder   Chronic respiratory failure with hypoxia (HCC)   Seizure disorder [facility])  Discharge Instructions:  Activity:  As tolerated   Discharge Instructions     Call MD for:  persistant nausea and vomiting   Complete by: As directed    Call MD for:  temperature >100.4   Complete by: As directed    Discharge instructions   Complete by: As directed    Follow with Primary MD  Ok Edwards, MD in 1-2 weeks  Please get a complete blood count and chemistry panel checked by your Primary MD at your next visit, and again as instructed by your Primary MD.  Get Medicines reviewed and adjusted: Please take all your medications with you for your next visit with your Primary MD  Laboratory/radiological data: Please request your Primary MD to go over all hospital tests and procedure/radiological results at the follow up, please ask your Primary MD to get all Hospital records sent to his/her office.  In some cases, they will be blood work, cultures and biopsy results pending at the time of your discharge. Please request that your primary care M.D. follows up on these results.  Also Note the following: If you experience worsening of your admission symptoms, develop shortness of breath, life threatening emergency, suicidal or homicidal thoughts you must seek medical attention immediately by calling 911 or calling your MD immediately  if symptoms less severe.  You must read complete instructions/literature along with all the possible adverse reactions/side effects for all the Medicines you take and that have been prescribed to you. Take  any new Medicines after you have completely understood and accpet all the possible adverse  reactions/side effects.   Do not drive when taking Pain medications or sleeping medications (Benzodaizepines)  Do not take more than prescribed Pain, Sleep and Anxiety Medications. It is not advisable to combine anxiety,sleep and pain medications without talking with your primary care practitioner  Special Instructions: If you have smoked or chewed Tobacco  in the last 2 yrs please stop smoking, stop any regular Alcohol  and or any Recreational drug use.  Wear Seat belts while driving.  Please note: You were cared for by a hospitalist during your hospital stay. Once you are discharged, your primary care physician will handle any further medical issues. Please note that NO REFILLS for any discharge medications will be authorized once you are discharged, as it is imperative that you return to your primary care physician (or establish a relationship with a primary care physician if you do not have one) for your post hospital discharge needs so that they can reassess your need for medications and monitor your lab values.   Increase activity slowly   Complete by: As directed       Allergies as of 01/05/2022       Reactions   Rituximab Other (See Comments)   Fever / Tachycardia / Infusion reaction after dose administered on 03/03/2021.  Patient was premedicated.  Infusion eventually completed at a slow rate / multiple interruptions.        Medication List     TAKE these medications    acetaminophen 160 MG/5ML solution Commonly known as: TYLENOL Place 20.3 mLs (650 mg total) into feeding tube every 4 (four) hours as needed for fever.   Baclofen 5 MG Tabs Take 5 mg by mouth 2 (two) times daily.   cefdinir 250 MG/5ML suspension Commonly known as: OMNICEF Place 6 mLs (300 mg total) into feeding tube 2 (two) times daily for 6 doses. Discard the remaining. Start taking on: January 06, 2022   ciprofloxacin 0.3 % ophthalmic solution Commonly known as: CILOXAN Place 2 drops into both  eyes 2 (two) times daily.   enoxaparin 40 MG/0.4ML injection Commonly known as: LOVENOX Inject 0.4 mLs (40 mg total) into the skin daily.   feeding supplement (PROSource TF) liquid Place 45 mLs into feeding tube 3 (three) times daily. What changed:  how much to take when to take this   folic acid 1 MG tablet Commonly known as: FOLVITE Take 1 mg by mouth daily.   free water Soln Place 200 mLs into feeding tube every 6 (six) hours.   gabapentin 100 MG capsule Commonly known as: NEURONTIN Take 100 mg by mouth 3 (three) times daily.   lactulose 10 GM/15ML solution Commonly known as: CHRONULAC SMARTSIG:30 Milliliter(s) By Mouth Daily PRN   levETIRacetam 1500 MG/100ML Soln Commonly known as: KEPPRA Inject 50 mLs (750 mg total) into the vein every 12 (twelve) hours. What changed: how much to take   levothyroxine 100 MCG tablet Commonly known as: SYNTHROID Place 1 tablet (100 mcg total) into feeding tube daily at 6 (six) AM.   mouth rinse Liqd solution 15 mLs by Mouth Rinse route 2 (two) times daily.   polyethylene glycol 17 g packet Commonly known as: MiraLax Place 17 g into feeding tube daily.   scopolamine 1 MG/3DAYS Commonly known as: TRANSDERM-SCOP Place 1 patch (1.5 mg total) onto the skin every 3 (three) days.   senna-docusate 8.6-50 MG tablet Commonly known as: Senokot-S Place 2  tablets into feeding tube 2 (two) times daily.   tenofovir 300 MG tablet Commonly known as: VIREAD Place 1 tablet (300 mg total) into feeding tube daily.   tobramycin-dexamethasone ophthalmic solution Commonly known as: TOBRADEX Place 1 drop into both eyes every 6 (six) hours.        Follow-up Information     Ok Edwards, MD. Schedule an appointment as soon as possible for a visit in 1 week(s).   Specialty: Pediatrics Contact information: [address] [place]  [address] [phone]                Allergies  Allergen Reactions    Rituximab Other (See Comments)    Fever / Tachycardia / Infusion reaction after dose administered on 03/03/2021.  Patient was premedicated.  Infusion eventually completed at a slow rate / multiple interruptions.     Other Procedures/Studies: DG Pelvis Portable  Result Date: 01/04/2022 CLINICAL DATA:  Lumbosacral swelling. Urinary tract infection. History of chronic encephalopathy and quadriplegia. Neurogenic bladder. EXAM: PORTABLE PELVIS 1-2 VIEWS COMPARISON:  None Available. FINDINGS: There is no evidence of pelvic fracture or diastasis. No pelvic bone lesions are seen. Multiple dilated bowel loops in a nonobstructive bowel gas pattern. Partially imaged gastrostomy tube in place. Rectal tube which appears to be outside the body. IMPRESSION: As above. Electronically Signed   By: Keane Police D.O.   On: 01/04/2022 15:57   Overnight EEG with video  Result Date: 01/04/2022 Lora Havens, MD     01/05/2022  9:03 AM Patient Name: Jamie [name] MRN: [id number] Epilepsy Attending: Lora Havens Referring Physician/Provider: Katy Apo, NP Duration: 01/03/2022 1702 to 01/04/2022 1702 Patient history: 21 year old female with anti-NMDA receptor encephalitis, status post trach PEG, quadriplegic and minimally responsive state admitted for UTI.  Now with worsening mental status.  EEG to evaluate for seizure. Level of alertness: awake, asleep AEDs during EEG study: Keppra Technical aspects: This EEG study was done with scalp electrodes positioned according to the 10-20 International system of electrode placement. Electrical activity was reviewed with band pass filter of 1-'70Hz'$ , sensitivity of 7 uV/mm, display speed of 46m/sec with a '60Hz'$  notched filter applied as appropriate. EEG data were recorded continuously and digitally stored.  Video monitoring was available and reviewed as appropriate. Description: The posterior dominant rhythm consists of 9-10 Hz activity of moderate voltage (25-35 uV)  seen predominantly in posterior head regions, symmetric and reactive to eye opening and eye closing. Sleep was characterized by vertex waves, sleep spindles (12 to 14 Hz), maximal frontocentral region.    EEG showed continuous generalized and maximal bilateral posterior quadrant 5 to 7 Hz theta slowing admixed with intermittent generalized, at times rhythmic sharply contoured to 3 Hz delta slowing. Hyperventilation and photic stimulation were not performed. At times, patient was noted to have episodes of rapid eye blinking.  Concomitant EEG before, during and after the event did not show any EEG changes suggest seizure. At times, patient was noted to have left upper extremity tremor-like movements.   Concomitant EEG before, during and after the event did not show any EEG changes suggest seizure. ABNORMALITY -Continuous slow, generalized and maximal bilateral posterior quadrant -Intermittent rhythmic delta slow, generalized IMPRESSION: This study is suggestive of cortical dysfunction in bilateral posterior quadrant.  Additional there is moderate diffuse encephalopathy, nonspecific etiology. No seizures or epileptiform discharges were seen throughout the recording. At times, patient was noted to have episodes of rapid eye blinking without concomitant EEG  change.  These episodes are most likely not epileptic. At times, patient was noted to have episodes of left upper extremity tremor-like movements without concomitant EEG change.  These episodes are most likely not epileptic. Lora Havens   MR BRAIN W WO CONTRAST  Result Date: 01/04/2022 CLINICAL DATA:  History of chronic encephalopathy, quadriplegia, anti NMDA receptor encephalitis presents with fever. EXAM: MRI HEAD WITHOUT AND WITH CONTRAST TECHNIQUE: Multiplanar, multiecho pulse sequences of the brain and surrounding structures were obtained without and with intravenous contrast. CONTRAST:  32m GADAVIST GADOBUTROL 1 MMOL/ML IV SOLN COMPARISON:  CT head  03/06/2021, brain MRI 03/01/2021 FINDINGS: Brain: There is no acute intracranial hemorrhage, extra-axial fluid collection, or acute infarct. There is mild global parenchymal volume loss which is greater than expected for age and increased since 2022. There is corresponding new slight prominence of the ventricular system and extra-axial CSF spaces. Gray-white differentiation is preserved. Parenchymal signal is normal. There is no hippocampal signal abnormality. There is no abnormal enhancement. There is no mass lesion. There is no mass effect or midline shift. Vascular: Normal flow voids. Skull and upper cervical spine: Normal marrow signal. Sinuses/Orbits: The paranasal sinuses are clear. The globes and orbits are unremarkable. Other: None. IMPRESSION: 1. No acute intracranial pathology. 2. Mild global parenchymal volume loss is greater than expected for age and new since 2022. Electronically Signed   By: PValetta MoleM.D.   On: 01/04/2022 09:50   CT ABDOMEN PELVIS W CONTRAST  Result Date: 01/02/2022 CLINICAL DATA:  Abdominal pain, distention. Bowel obstruction suspected. EXAM: CT ABDOMEN AND PELVIS WITH CONTRAST TECHNIQUE: Multidetector CT imaging of the abdomen and pelvis was performed using the standard protocol following bolus administration of intravenous contrast. RADIATION DOSE REDUCTION: This exam was performed according to the departmental dose-optimization program which includes automated exposure control, adjustment of the mA and/or kV according to patient size and/or use of iterative reconstruction technique. CONTRAST:  1071mOMNIPAQUE IOHEXOL 300 MG/ML  SOLN COMPARISON:  02/13/2021 FINDINGS: Lower chest: No acute abnormality Hepatobiliary: No focal hepatic abnormality. Gallbladder unremarkable. Pancreas: No focal abnormality or ductal dilatation. Spleen: No focal abnormality.  Normal size. Adrenals/Urinary Tract: No adrenal abnormality. No focal renal abnormality. No stones or hydronephrosis.  Urinary bladder is unremarkable. Stomach/Bowel: Gastrostomy tube in place within the stomach. No evidence of bowel obstruction. Large stool burden throughout the colon. Mild gaseous distention of the left colon. Vascular/Lymphatic: No evidence of aneurysm or adenopathy. Reproductive: Uterus and adnexa unremarkable.  No mass. Other: No free fluid or free air. Musculoskeletal: No acute bony abnormality. IMPRESSION: Gastrostomy tube within the stomach. No visible complicating feature. Large stool burden in the colon. Mild gaseous distention of left colon. No evidence of bowel obstruction. Electronically Signed   By: KeRolm Baptise.D.   On: 01/02/2022 18:35   DG Chest Portable 1 View  Result Date: 01/02/2022 CLINICAL DATA:  Cough, fever EXAM: PORTABLE CHEST 1 VIEW COMPARISON:  03/15/2021 FINDINGS: Tracheostomy tube in place. Normal heart size. No focal airspace consolidation, pleural effusion, or pneumothorax. IMPRESSION: No active disease. Electronically Signed   By: NiDavina Poke.O.   On: 01/02/2022 14:49     TODAY-DAY OF DISCHARGE:  Subjective:   Jamie [name] today remains unresponsive-opens eyes at times.  No major issues overnight.  Objective:   Blood pressure 119/78, pulse (!) 101, temperature 97.9 F (36.6 C), temperature source Axillary, resp. rate 19, height '5\' 3"'$  (1.6 m), weight 62 kg, SpO2 99 %.  Intake/Output Summary (Last 24 hours)  at 01/05/2022 1104 Last data filed at 01/05/2022 0600 Gross per 24 hour  Intake --  Output 360 ml  Net -360 ml   Filed Weights   01/03/22 1444 01/04/22 0500 01/05/22 0416  Weight: 60.8 kg 56.3 kg 62 kg    Exam: No new F.N deficits [place].AT,PERRAL Supple Neck,No JVD, No cervical lymphadenopathy appriciated.  Symmetrical Chest wall movement, Good air movement bilaterally, CTAB RRR,No Gallops,Rubs or new Murmurs, No Parasternal Heave +ve B.Sounds, Abd Soft, Non tender, No organomegaly appriciated, No rebound -guarding or rigidity. No  Cyanosis, Clubbing or edema, No new Rash or bruise   PERTINENT RADIOLOGIC STUDIES: DG Pelvis Portable  Result Date: 01/04/2022 CLINICAL DATA:  Lumbosacral swelling. Urinary tract infection. History of chronic encephalopathy and quadriplegia. Neurogenic bladder. EXAM: PORTABLE PELVIS 1-2 VIEWS COMPARISON:  None Available. FINDINGS: There is no evidence of pelvic fracture or diastasis. No pelvic bone lesions are seen. Multiple dilated bowel loops in a nonobstructive bowel gas pattern. Partially imaged gastrostomy tube in place. Rectal tube which appears to be outside the body. IMPRESSION: As above. Electronically Signed   By: Keane Police D.O.   On: 01/04/2022 15:57   Overnight EEG with video  Result Date: 01/04/2022 Lora Havens, MD     01/05/2022  9:03 AM Patient Name: Jamie [name] MRN: [id number] Epilepsy Attending: Lora Havens Referring Physician/Provider: Katy Apo, NP Duration: 01/03/2022 1702 to 01/04/2022 1702 Patient history: 21 year old female with anti-NMDA receptor encephalitis, status post trach PEG, quadriplegic and minimally responsive state admitted for UTI.  Now with worsening mental status.  EEG to evaluate for seizure. Level of alertness: awake, asleep AEDs during EEG study: Keppra Technical aspects: This EEG study was done with scalp electrodes positioned according to the 10-20 International system of electrode placement. Electrical activity was reviewed with band pass filter of 1-'70Hz'$ , sensitivity of 7 uV/mm, display speed of 22m/sec with a '60Hz'$  notched filter applied as appropriate. EEG data were recorded continuously and digitally stored.  Video monitoring was available and reviewed as appropriate. Description: The posterior dominant rhythm consists of 9-10 Hz activity of moderate voltage (25-35 uV) seen predominantly in posterior head regions, symmetric and reactive to eye opening and eye closing. Sleep was characterized by vertex waves, sleep spindles (12  to 14 Hz), maximal frontocentral region.    EEG showed continuous generalized and maximal bilateral posterior quadrant 5 to 7 Hz theta slowing admixed with intermittent generalized, at times rhythmic sharply contoured to 3 Hz delta slowing. Hyperventilation and photic stimulation were not performed. At times, patient was noted to have episodes of rapid eye blinking.  Concomitant EEG before, during and after the event did not show any EEG changes suggest seizure. At times, patient was noted to have left upper extremity tremor-like movements.   Concomitant EEG before, during and after the event did not show any EEG changes suggest seizure. ABNORMALITY -Continuous slow, generalized and maximal bilateral posterior quadrant -Intermittent rhythmic delta slow, generalized IMPRESSION: This study is suggestive of cortical dysfunction in bilateral posterior quadrant.  Additional there is moderate diffuse encephalopathy, nonspecific etiology. No seizures or epileptiform discharges were seen throughout the recording. At times, patient was noted to have episodes of rapid eye blinking without concomitant EEG change.  These episodes are most likely not epileptic. At times, patient was noted to have episodes of left upper extremity tremor-like movements without concomitant EEG change.  These episodes are most likely not epileptic. PGrubbs  Result Date: 01/04/2022 CLINICAL DATA:  History of chronic encephalopathy, quadriplegia, anti NMDA receptor encephalitis presents with fever. EXAM: MRI HEAD WITHOUT AND WITH CONTRAST TECHNIQUE: Multiplanar, multiecho pulse sequences of the brain and surrounding structures were obtained without and with intravenous contrast. CONTRAST:  20m GADAVIST GADOBUTROL 1 MMOL/ML IV SOLN COMPARISON:  CT head 03/06/2021, brain MRI 03/01/2021 FINDINGS: Brain: There is no acute intracranial hemorrhage, extra-axial fluid collection, or acute infarct. There is mild global  parenchymal volume loss which is greater than expected for age and increased since 2022. There is corresponding new slight prominence of the ventricular system and extra-axial CSF spaces. Gray-white differentiation is preserved. Parenchymal signal is normal. There is no hippocampal signal abnormality. There is no abnormal enhancement. There is no mass lesion. There is no mass effect or midline shift. Vascular: Normal flow voids. Skull and upper cervical spine: Normal marrow signal. Sinuses/Orbits: The paranasal sinuses are clear. The globes and orbits are unremarkable. Other: None. IMPRESSION: 1. No acute intracranial pathology. 2. Mild global parenchymal volume loss is greater than expected for age and new since 2022. Electronically Signed   By: PValetta MoleM.D.   On: 01/04/2022 09:50     PERTINENT LAB RESULTS: CBC: Recent Labs    01/03/22 0337 01/04/22 0238  WBC 8.5 5.5  HGB 10.6* 9.5*  HCT 35.4* 32.8*  PLT 375 299   CMET CMP     Component Value Date/Time   NA 147 (H) 01/05/2022 0729   K 4.5 01/05/2022 0729   CL 117 (H) 01/05/2022 0729   CO2 25 01/05/2022 0729   GLUCOSE 83 01/05/2022 0729   BUN 17 01/05/2022 0729   CREATININE 0.45 01/05/2022 0729   CALCIUM 9.2 01/05/2022 0729   PROT 6.1 (L) 01/03/2022 0337   ALBUMIN 3.4 (L) 01/03/2022 0337   ALBUMIN 4.5 02/08/2021 1531   AST 40 01/03/2022 0337   ALT 20 01/03/2022 0337   ALKPHOS 41 01/03/2022 0337   BILITOT 0.6 01/03/2022 0337   GFRNONAA >60 01/05/2022 0729    GFR Estimated Creatinine Clearance: 92.8 mL/min (by C-G formula based on SCr of 0.45 mg/dL). Recent Labs    01/02/22 1430  LIPASE 30   Recent Labs    01/03/22 0337  CKTOTAL 977*   Invalid input(s): "POCBNP" No results for input(s): "DDIMER" in the last 72 hours. No results for input(s): "HGBA1C" in the last 72 hours. No results for input(s): "CHOL", "HDL", "LDLCALC", "TRIG", "CHOLHDL", "LDLDIRECT" in the last 72 hours. Recent Labs    01/03/22 0337  TSH  0.415   No results for input(s): "VITAMINB12", "FOLATE", "FERRITIN", "TIBC", "IRON", "RETICCTPCT" in the last 72 hours. Coags: Recent Labs    01/03/22 0337  INR 1.1   Microbiology: Recent Results (from the past 240 hour(s))  Blood culture (routine x 2)     Status: None (Preliminary result)   Collection Time: 01/02/22  2:13 PM   Specimen: BLOOD RIGHT HAND  Result Value Ref Range Status   Specimen Description BLOOD RIGHT HAND  Final   Special Requests   Final    BOTTLES DRAWN AEROBIC AND ANAEROBIC Blood Culture adequate volume   Culture   Final    NO GROWTH 3 DAYS Performed at [facility] [address] [place]  [id number]   Report Status PENDING  Incomplete  Urine Culture     Status: None   Collection Time: 01/02/22  2:15 PM   Specimen: Urine, Clean Catch  Result Value Ref Range Status  Specimen Description URINE, CLEAN CATCH  Final   Special Requests NONE  Final   Culture   Final    NO GROWTH Performed at [facility], [place] [address] [place], [place] [address]    Report Status 01/03/2022 FINAL  Final  SARS Coronavirus 2 by RT PCR (hospital order, performed in [facility] hospital lab) *cepheid single result test* Anterior Nasal Swab     Status: None   Collection Time: 01/02/22  2:50 PM   Specimen: Anterior Nasal Swab  Result Value Ref Range Status   SARS Coronavirus 2 by RT PCR NEGATIVE NEGATIVE Final    Comment: (NOTE) SARS-CoV-2 target nucleic acids are NOT DETECTED.  The SARS-CoV-2 RNA is generally detectable in upper and lower respiratory specimens during the acute phase of infection. The lowest concentration of SARS-CoV-2 viral copies this assay can detect is 250 copies / mL. A negative result does not preclude SARS-CoV-2 infection and should not be used as the sole basis for treatment or other patient management decisions.  A negative result may occur with improper specimen collection / handling, submission of specimen other than  nasopharyngeal swab, presence of viral mutation(s) within the areas targeted by this assay, and inadequate number of viral copies (<250 copies / mL). A negative result must be combined with clinical observations, patient history, and epidemiological information.  Fact Sheet for Patients:   https://www.patel.info/  Fact Sheet for Healthcare Providers: https://hall.com/  This test is not yet approved or  cleared by the [place] FDA and has been authorized for detection and/or diagnosis of SARS-CoV-2 by FDA under an Emergency Use Authorization (EUA).  This EUA will remain in effect (meaning this test can be used) for the duration of the COVID-19 declaration under Section 564(b)(1) of the Act, 21 U.S.C. section 360bbb-3(b)(1), unless the authorization is terminated or revoked sooner.  Performed at [facility], [place] [address], [place], [place] [address]   Blood culture (routine x 2)     Status: None (Preliminary result)   Collection Time: 01/02/22  7:00 PM   Specimen: BLOOD  Result Value Ref Range Status   Specimen Description BLOOD SITE NOT SPECIFIED  Final   Special Requests   Final    BOTTLES DRAWN AEROBIC AND ANAEROBIC Blood Culture adequate volume   Culture   Final    NO GROWTH 3 DAYS Performed at [facility], [address], [place], [place] [address]    Report Status PENDING  Incomplete    FURTHER DISCHARGE INSTRUCTIONS:  Get Medicines reviewed and adjusted: Please take all your medications with you for your next visit with your Primary MD  Laboratory/radiological data: Please request your Primary MD to go over all hospital tests and procedure/radiological results at the follow up, please ask your Primary MD to get all Hospital records sent to his/her office.  In some cases, they will be blood work, cultures and biopsy results pending at the time of your discharge. Please request that your primary care M.D.  goes through all the records of your hospital data and follows up on these results.  Also Note the following: If you experience worsening of your admission symptoms, develop shortness of breath, life threatening emergency, suicidal or homicidal thoughts you must seek medical attention immediately by calling 911 or calling your MD immediately  if symptoms less severe.  You must read complete instructions/literature along with all the possible adverse reactions/side effects for all the Medicines you take and that have been  prescribed to you. Take any new Medicines after you have completely understood and accpet all the possible adverse reactions/side effects.   Do not drive when taking Pain medications or sleeping medications (Benzodaizepines)  Do not take more than prescribed Pain, Sleep and Anxiety Medications. It is not advisable to combine anxiety,sleep and pain medications without talking with your primary care practitioner  Special Instructions: If you have smoked or chewed Tobacco  in the last 2 yrs please stop smoking, stop any regular Alcohol  and or any Recreational drug use.  Wear Seat belts while driving.  Please note: You were cared for by a hospitalist during your hospital stay. Once you are discharged, your primary care physician will handle any further medical issues. Please note that NO REFILLS for any discharge medications will be authorized once you are discharged, as it is imperative that you return to your primary care physician (or establish a relationship with a primary care physician if you do not have one) for your post hospital discharge needs so that they can reassess your need for medications and monitor your lab values.  Total Time spent coordinating discharge including counseling, education and face to face time equals greater than 30 minutes.  SignedOren Binet 01/05/2022 11:04 AM

## 2022-01-05 NOTE — Progress Notes (Signed)
LTM EEG discontinued - no skin breakdown at unhook.   

## 2022-01-05 NOTE — Progress Notes (Signed)
Subjective: No acute events overnight.  Per father at bedside, she is close to her baseline.  ROS: Unable to obtain due to poor mental status  Examination  Vital signs in last 24 hours: Temp:  [97.9 F (36.6 C)-98.3 F (36.8 C)] 97.9 F (36.6 C) (09/13 0758) Pulse Rate:  [86-131] 101 (09/13 0758) Resp:  [13-20] 19 (09/13 0758) BP: (112-125)/(65-82) 119/78 (09/13 0758) SpO2:  [96 %-100 %] 99 % (09/13 0758) FiO2 (%):  [28 %] 28 % (09/13 0818) Weight:  [62 kg] 62 kg (09/13 0416)  General: lying in bed, NAD Neuro: Did not open eyes for me, slightly increased tone in all 4 extremities but improved compared to yesterday  Basic Metabolic Panel: Recent Labs  Lab 01/02/22 1430 01/03/22 0337 01/03/22 1546 01/04/22 0238 01/05/22 0729  NA 142 145 147* 148* 147*  K 2.9* 2.9* 3.3* 3.0* 4.5  CL 89* 104 106 112* 117*  CO2 33* '28 26 28 25  '$ GLUCOSE 133* 103* 92 94 83  BUN 45* 36* '20 15 17  '$ CREATININE 0.85 0.76 0.58 0.44 0.45  CALCIUM 10.7* 9.2 9.5 9.0 9.2  MG  --  2.2  --  2.1  --   PHOS  --  1.6* 4.5 4.1  --     CBC: Recent Labs  Lab 01/02/22 1430 01/03/22 0337 01/04/22 0238  WBC 10.4 8.5 5.5  NEUTROABS 7.7 5.8  --   HGB 14.3 10.6* 9.5*  HCT 46.5* 35.4* 32.8*  MCV 82.7 84.1 84.8  PLT 525* 375 299     Coagulation Studies: Recent Labs    01/03/22 0337  LABPROT 14.5  INR 1.1    Imaging No new imaging overnight   ASSESSMENT AND PLAN: 21 year old female with history of anti-NMDA receptor encephalitis status post trach/PEG, quadriplegic, minimally interactive (opens eyes and maybe track people in room at times per family), seizures, dyskinesias, prior neuro storming who is admitted with fever, constipation.     Fever Constipation Chronic encephalopathy post anti-NMDA encephalitis Hypokalemia Hypernatremia Microcytic anemia Hypothyroidism Rhabdomyolysis Lactic acidosis Spasticity -No leukocytosis, no known urine culture, blood culture.  No fever  overnight -Fever could have been due to neuro storming as patient is also tachycardic in the 180s   Recommendations -LTM EEG did not show any evidence of seizures overnight.  Stop LTM EEG - Continue Keppra 750 mg twice daily.  Hesitant to change medications unless definite evidence of seizures on EEG because patient has been on multiple AEDs in the past which contributed to sedation. -Continue home baclofen and gabapentin -Recommend follow-up with patient's primary neurologist Dr. Shaaron Adler at [facility] to use as needed IV Ativan 0.5 to 1 mg for clinical seizure-like activity.  Please note notify neurology if this is administered. -Management of rest of comorbidities per primary team -Discussed plan with father at bedside as well as Dr. Sloan Leiter   I have spent a total of  26  minutes with the patient reviewing hospital notes,  test results, labs and examining the patient as well as establishing an assessment and plan.  > 50% of time was spent in direct patient care.     Zeb Comfort Epilepsy [facility] For questions after 5pm please refer to AMION to reach the Neurologist on call

## 2022-01-06 DIAGNOSIS — G9340 Encephalopathy, unspecified: Secondary | ICD-10-CM | POA: Diagnosis not present

## 2022-01-06 DIAGNOSIS — Z931 Gastrostomy status: Secondary | ICD-10-CM | POA: Diagnosis not present

## 2022-01-07 LAB — CULTURE, BLOOD (ROUTINE X 2)
Culture: NO GROWTH
Culture: NO GROWTH
Special Requests: ADEQUATE
Special Requests: ADEQUATE

## 2022-01-10 DIAGNOSIS — G9340 Encephalopathy, unspecified: Secondary | ICD-10-CM | POA: Diagnosis not present

## 2022-01-10 DIAGNOSIS — Z931 Gastrostomy status: Secondary | ICD-10-CM | POA: Diagnosis not present

## 2022-01-12 DIAGNOSIS — R32 Unspecified urinary incontinence: Secondary | ICD-10-CM | POA: Diagnosis not present

## 2022-01-12 DIAGNOSIS — G0481 Other encephalitis and encephalomyelitis: Secondary | ICD-10-CM | POA: Diagnosis not present

## 2022-01-12 DIAGNOSIS — R159 Full incontinence of feces: Secondary | ICD-10-CM | POA: Diagnosis not present

## 2022-01-13 DIAGNOSIS — G9340 Encephalopathy, unspecified: Secondary | ICD-10-CM | POA: Diagnosis not present

## 2022-01-13 DIAGNOSIS — Z930 Tracheostomy status: Secondary | ICD-10-CM | POA: Diagnosis not present

## 2022-01-14 DIAGNOSIS — H109 Unspecified conjunctivitis: Secondary | ICD-10-CM | POA: Diagnosis not present

## 2022-01-14 DIAGNOSIS — M6281 Muscle weakness (generalized): Secondary | ICD-10-CM | POA: Diagnosis not present

## 2022-01-14 DIAGNOSIS — Z7401 Bed confinement status: Secondary | ICD-10-CM | POA: Diagnosis not present

## 2022-01-17 DIAGNOSIS — Z931 Gastrostomy status: Secondary | ICD-10-CM | POA: Diagnosis not present

## 2022-01-17 DIAGNOSIS — G9340 Encephalopathy, unspecified: Secondary | ICD-10-CM | POA: Diagnosis not present

## 2022-01-22 DIAGNOSIS — Z931 Gastrostomy status: Secondary | ICD-10-CM | POA: Diagnosis not present

## 2022-01-22 DIAGNOSIS — G9340 Encephalopathy, unspecified: Secondary | ICD-10-CM | POA: Diagnosis not present

## 2022-01-27 DIAGNOSIS — G40909 Epilepsy, unspecified, not intractable, without status epilepticus: Secondary | ICD-10-CM | POA: Diagnosis not present

## 2022-01-27 DIAGNOSIS — E063 Autoimmune thyroiditis: Secondary | ICD-10-CM | POA: Diagnosis not present

## 2022-01-27 DIAGNOSIS — Z931 Gastrostomy status: Secondary | ICD-10-CM | POA: Diagnosis not present

## 2022-01-27 DIAGNOSIS — B372 Candidiasis of skin and nail: Secondary | ICD-10-CM | POA: Diagnosis not present

## 2022-01-27 DIAGNOSIS — Z930 Tracheostomy status: Secondary | ICD-10-CM | POA: Diagnosis not present

## 2022-01-27 DIAGNOSIS — Z7401 Bed confinement status: Secondary | ICD-10-CM | POA: Diagnosis not present

## 2022-02-02 DIAGNOSIS — G9340 Encephalopathy, unspecified: Secondary | ICD-10-CM | POA: Diagnosis not present

## 2022-02-02 DIAGNOSIS — Z930 Tracheostomy status: Secondary | ICD-10-CM | POA: Diagnosis not present

## 2022-02-02 DIAGNOSIS — Z7901 Long term (current) use of anticoagulants: Secondary | ICD-10-CM | POA: Diagnosis not present

## 2022-02-02 DIAGNOSIS — G0481 Other encephalitis and encephalomyelitis: Secondary | ICD-10-CM | POA: Diagnosis not present

## 2022-02-05 DIAGNOSIS — Z931 Gastrostomy status: Secondary | ICD-10-CM | POA: Diagnosis not present

## 2022-02-05 DIAGNOSIS — G9340 Encephalopathy, unspecified: Secondary | ICD-10-CM | POA: Diagnosis not present

## 2022-02-13 DIAGNOSIS — H109 Unspecified conjunctivitis: Secondary | ICD-10-CM | POA: Diagnosis not present

## 2022-02-13 DIAGNOSIS — Z7401 Bed confinement status: Secondary | ICD-10-CM | POA: Diagnosis not present

## 2022-02-13 DIAGNOSIS — M6281 Muscle weakness (generalized): Secondary | ICD-10-CM | POA: Diagnosis not present

## 2022-02-14 DIAGNOSIS — G9340 Encephalopathy, unspecified: Secondary | ICD-10-CM | POA: Diagnosis not present

## 2022-02-14 DIAGNOSIS — Z931 Gastrostomy status: Secondary | ICD-10-CM | POA: Diagnosis not present

## 2022-02-15 DIAGNOSIS — R159 Full incontinence of feces: Secondary | ICD-10-CM | POA: Diagnosis not present

## 2022-02-15 DIAGNOSIS — G0481 Other encephalitis and encephalomyelitis: Secondary | ICD-10-CM | POA: Diagnosis not present

## 2022-02-15 DIAGNOSIS — R32 Unspecified urinary incontinence: Secondary | ICD-10-CM | POA: Diagnosis not present

## 2022-02-16 DIAGNOSIS — G9340 Encephalopathy, unspecified: Secondary | ICD-10-CM | POA: Diagnosis not present

## 2022-02-16 DIAGNOSIS — Z931 Gastrostomy status: Secondary | ICD-10-CM | POA: Diagnosis not present

## 2022-02-22 DIAGNOSIS — Z931 Gastrostomy status: Secondary | ICD-10-CM | POA: Diagnosis not present

## 2022-02-22 DIAGNOSIS — G9340 Encephalopathy, unspecified: Secondary | ICD-10-CM | POA: Diagnosis not present

## 2022-02-24 DIAGNOSIS — Z930 Tracheostomy status: Secondary | ICD-10-CM | POA: Diagnosis not present

## 2022-02-24 DIAGNOSIS — G40909 Epilepsy, unspecified, not intractable, without status epilepticus: Secondary | ICD-10-CM | POA: Diagnosis not present

## 2022-02-24 DIAGNOSIS — Z7401 Bed confinement status: Secondary | ICD-10-CM | POA: Diagnosis not present

## 2022-02-24 DIAGNOSIS — Z931 Gastrostomy status: Secondary | ICD-10-CM | POA: Diagnosis not present

## 2022-02-24 DIAGNOSIS — L282 Other prurigo: Secondary | ICD-10-CM | POA: Diagnosis not present

## 2022-02-25 DIAGNOSIS — Z930 Tracheostomy status: Secondary | ICD-10-CM | POA: Diagnosis not present

## 2022-02-25 DIAGNOSIS — G9340 Encephalopathy, unspecified: Secondary | ICD-10-CM | POA: Diagnosis not present

## 2022-03-08 DIAGNOSIS — Z931 Gastrostomy status: Secondary | ICD-10-CM | POA: Diagnosis not present

## 2022-03-08 DIAGNOSIS — G9340 Encephalopathy, unspecified: Secondary | ICD-10-CM | POA: Diagnosis not present

## 2022-03-16 DIAGNOSIS — Z7401 Bed confinement status: Secondary | ICD-10-CM | POA: Diagnosis not present

## 2022-03-16 DIAGNOSIS — H109 Unspecified conjunctivitis: Secondary | ICD-10-CM | POA: Diagnosis not present

## 2022-03-16 DIAGNOSIS — M6281 Muscle weakness (generalized): Secondary | ICD-10-CM | POA: Diagnosis not present

## 2022-03-17 DIAGNOSIS — G9340 Encephalopathy, unspecified: Secondary | ICD-10-CM | POA: Diagnosis not present

## 2022-03-17 DIAGNOSIS — Z931 Gastrostomy status: Secondary | ICD-10-CM | POA: Diagnosis not present

## 2022-03-18 DIAGNOSIS — R32 Unspecified urinary incontinence: Secondary | ICD-10-CM | POA: Diagnosis not present

## 2022-03-18 DIAGNOSIS — R159 Full incontinence of feces: Secondary | ICD-10-CM | POA: Diagnosis not present

## 2022-03-18 DIAGNOSIS — G0481 Other encephalitis and encephalomyelitis: Secondary | ICD-10-CM | POA: Diagnosis not present

## 2022-03-24 DIAGNOSIS — G9340 Encephalopathy, unspecified: Secondary | ICD-10-CM | POA: Diagnosis not present

## 2022-03-24 DIAGNOSIS — Z931 Gastrostomy status: Secondary | ICD-10-CM | POA: Diagnosis not present

## 2022-04-06 DIAGNOSIS — R52 Pain, unspecified: Secondary | ICD-10-CM | POA: Diagnosis not present

## 2022-04-06 DIAGNOSIS — B3731 Acute candidiasis of vulva and vagina: Secondary | ICD-10-CM | POA: Diagnosis not present

## 2022-04-06 DIAGNOSIS — H109 Unspecified conjunctivitis: Secondary | ICD-10-CM | POA: Diagnosis not present

## 2022-04-06 DIAGNOSIS — G40909 Epilepsy, unspecified, not intractable, without status epilepticus: Secondary | ICD-10-CM | POA: Diagnosis not present

## 2022-04-06 DIAGNOSIS — Z930 Tracheostomy status: Secondary | ICD-10-CM | POA: Diagnosis not present

## 2022-04-06 DIAGNOSIS — Z7401 Bed confinement status: Secondary | ICD-10-CM | POA: Diagnosis not present

## 2022-04-07 DIAGNOSIS — G9340 Encephalopathy, unspecified: Secondary | ICD-10-CM | POA: Diagnosis not present

## 2022-04-07 DIAGNOSIS — Z931 Gastrostomy status: Secondary | ICD-10-CM | POA: Diagnosis not present

## 2022-04-11 DIAGNOSIS — Z931 Gastrostomy status: Secondary | ICD-10-CM | POA: Diagnosis not present

## 2022-04-11 DIAGNOSIS — G9340 Encephalopathy, unspecified: Secondary | ICD-10-CM | POA: Diagnosis not present

## 2022-04-13 DIAGNOSIS — Z930 Tracheostomy status: Secondary | ICD-10-CM | POA: Diagnosis not present

## 2022-04-13 DIAGNOSIS — G9340 Encephalopathy, unspecified: Secondary | ICD-10-CM | POA: Diagnosis not present

## 2022-04-14 DIAGNOSIS — G9340 Encephalopathy, unspecified: Secondary | ICD-10-CM | POA: Diagnosis not present

## 2022-04-14 DIAGNOSIS — Z930 Tracheostomy status: Secondary | ICD-10-CM | POA: Diagnosis not present

## 2022-04-15 DIAGNOSIS — M6281 Muscle weakness (generalized): Secondary | ICD-10-CM | POA: Diagnosis not present

## 2022-04-15 DIAGNOSIS — Z7401 Bed confinement status: Secondary | ICD-10-CM | POA: Diagnosis not present

## 2022-04-15 DIAGNOSIS — H109 Unspecified conjunctivitis: Secondary | ICD-10-CM | POA: Diagnosis not present

## 2022-04-18 DIAGNOSIS — R159 Full incontinence of feces: Secondary | ICD-10-CM | POA: Diagnosis not present

## 2022-04-18 DIAGNOSIS — G0481 Other encephalitis and encephalomyelitis: Secondary | ICD-10-CM | POA: Diagnosis not present

## 2022-04-18 DIAGNOSIS — R32 Unspecified urinary incontinence: Secondary | ICD-10-CM | POA: Diagnosis not present

## 2022-04-24 DIAGNOSIS — G9340 Encephalopathy, unspecified: Secondary | ICD-10-CM | POA: Diagnosis not present

## 2022-04-24 DIAGNOSIS — Z931 Gastrostomy status: Secondary | ICD-10-CM | POA: Diagnosis not present

## 2022-04-29 ENCOUNTER — Telehealth: Payer: Self-pay | Admitting: Pulmonary Disease

## 2022-04-29 NOTE — Telephone Encounter (Signed)
Patient has never been seen in office by any of [facility] providers.  No pulmonary referral in epic. I called and spoke with Terri, RN.  Terri stated patient was discharged from a hospital with a trach.  Terri stated patient is being taking care of at home by parents and her trach had come out.  [facility] for any urgent needs. Terri stated patient's father placed trach back in and patient was doing ok.  Terri stated patient is needing trach care management.   [facility] of [facility] and contact number given.  Terri stated she would call and follow up with trach center for patient needs.

## 2022-04-29 NOTE — Telephone Encounter (Signed)
[name] nurse calling.   A referral has been sent over several times since Nov. To have this PT seen. This PT is "unresponsive, 100% home care." If she comes in it would be ambulatory.  The dad knocked out her trach. And they need to have her seen for that. Do we do this? Can I make appt w/o referral for this? Please call top advise. I did verify we are Pulmonology and she said "That's right."   Referring Dr. Is [facility]'s. Dr. Clovia Cuff  Her name is Jamie [name], Nurse w/ [name] [phone]  Please call her when you can.

## 2022-05-04 DIAGNOSIS — Z930 Tracheostomy status: Secondary | ICD-10-CM | POA: Diagnosis not present

## 2022-05-04 DIAGNOSIS — G40909 Epilepsy, unspecified, not intractable, without status epilepticus: Secondary | ICD-10-CM | POA: Diagnosis not present

## 2022-05-04 DIAGNOSIS — Z7401 Bed confinement status: Secondary | ICD-10-CM | POA: Diagnosis not present

## 2022-05-04 DIAGNOSIS — Z931 Gastrostomy status: Secondary | ICD-10-CM | POA: Diagnosis not present

## 2022-05-08 DIAGNOSIS — G9340 Encephalopathy, unspecified: Secondary | ICD-10-CM | POA: Diagnosis not present

## 2022-05-08 DIAGNOSIS — Z931 Gastrostomy status: Secondary | ICD-10-CM | POA: Diagnosis not present

## 2022-05-13 DIAGNOSIS — G0481 Other encephalitis and encephalomyelitis: Secondary | ICD-10-CM | POA: Diagnosis not present

## 2022-05-13 DIAGNOSIS — R930 Abnormal findings on diagnostic imaging of skull and head, not elsewhere classified: Secondary | ICD-10-CM | POA: Diagnosis not present

## 2022-05-16 DIAGNOSIS — Z931 Gastrostomy status: Secondary | ICD-10-CM | POA: Diagnosis not present

## 2022-05-16 DIAGNOSIS — M6281 Muscle weakness (generalized): Secondary | ICD-10-CM | POA: Diagnosis not present

## 2022-05-16 DIAGNOSIS — Z7401 Bed confinement status: Secondary | ICD-10-CM | POA: Diagnosis not present

## 2022-05-16 DIAGNOSIS — H109 Unspecified conjunctivitis: Secondary | ICD-10-CM | POA: Diagnosis not present

## 2022-05-16 DIAGNOSIS — G9340 Encephalopathy, unspecified: Secondary | ICD-10-CM | POA: Diagnosis not present

## 2022-05-18 ENCOUNTER — Ambulatory Visit (HOSPITAL_COMMUNITY)
Admission: RE | Admit: 2022-05-18 | Discharge: 2022-05-18 | Disposition: A | Discharge status: Home / Self Care | Payer: Medicaid Other | Source: Ambulatory Visit | Attending: Acute Care | Admitting: Acute Care

## 2022-05-18 DIAGNOSIS — J9611 Chronic respiratory failure with hypoxia: Secondary | ICD-10-CM | POA: Diagnosis not present

## 2022-05-18 DIAGNOSIS — M436 Torticollis: Secondary | ICD-10-CM

## 2022-05-18 DIAGNOSIS — Z930 Tracheostomy status: Secondary | ICD-10-CM | POA: Diagnosis not present

## 2022-05-18 DIAGNOSIS — G9349 Other encephalopathy: Secondary | ICD-10-CM | POA: Insufficient documentation

## 2022-05-18 NOTE — Progress Notes (Signed)
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  Presents today to get established in trach clinic.   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    Social Determinants of Health   Financial Resource Strain: Not on file  Food Insecurity: Not on file  Physical Activity: Not on file  Stress: Not on file  Social Connections: Not on file  Intimate Partner Violence: Not on file     Current Outpatient Medications:    acetaminophen (TYLENOL) 160 MG/5ML solution, Place 20.3 mLs (650 mg total) into feeding tube every 4 (four) hours as needed for fever., Disp: 120 mL, Rfl: 0   Baclofen 5 MG TABS, Take 5 mg by mouth 2 (two) times daily., Disp: , Rfl:    ciprofloxacin (CILOXAN) 0.3 % ophthalmic solution, Place 2 drops into both eyes 2 (two) times daily., Disp: , Rfl:    enoxaparin (LOVENOX) 40 MG/0.4ML injection, Inject 0.4 mLs (40 mg total) into the skin daily., Disp: 0 mL, Rfl:    folic acid (FOLVITE) 1 MG tablet, Take 1 mg by mouth daily., Disp: , Rfl:    gabapentin (NEURONTIN) 100 MG capsule, Take 100 mg by mouth 3 (three) times daily., Disp: , Rfl:    lactulose (CHRONULAC) 10 GM/15ML solution, SMARTSIG:30 Milliliter(s) By Mouth Daily PRN, Disp: , Rfl:    levETIRacetam (KEPPRA) 1500 MG/100ML SOLN, Inject 50 mLs (750 mg total) into the vein every 12 (twelve) hours., Disp: 1333.3 mL, Rfl:    levothyroxine (SYNTHROID) 100 MCG tablet, Place 1 tablet (100 mcg  total) into feeding tube daily at 6 (six) AM., Disp: , Rfl:    Mouthwashes (MOUTH RINSE) LIQD solution, 15 mLs by Mouth Rinse route 2 (two) times daily., Disp: , Rfl: 0   Nutritional Supplements (FEEDING SUPPLEMENT, PROSOURCE TF,) liquid, Place 45 mLs into feeding tube 3 (three) times daily. (Patient taking differently: Place 237 mLs into feeding tube 5 (five) times daily.), Disp: , Rfl:    polyethylene glycol powder (GLYCOLAX/MIRALAX) 17 GM/SCOOP powder, Place 17 g into feeding tube daily., Disp: 238 g, Rfl: 0   scopolamine (TRANSDERM-SCOP) 1 MG/3DAYS, Place 1 patch (1.5 mg total) onto the skin every 3 (three) days., Disp: 10 patch, Rfl: 12   tenofovir (VIREAD) 300 MG tablet, Place 1 tablet (300 mg total) into feeding tube daily., Disp: , Rfl:    tobramycin-dexamethasone (TOBRADEX) ophthalmic solution, Place 1 drop into both eyes every 6 (six) hours., Disp: , Rfl:    Water For Irrigation, Sterile (FREE WATER) SOLN, Place 200 mLs into feeding tube every 6 (six) hours., Disp: , Rfl:    ROS  Not able   Exam   General 22 year old female, resting in bed. NAD HENT NCAT #6 cuffless trach. Stoma anterior appearing. Well matured, does have neck contracture favoring looking to the left Pulm occ rhonchi that clear w/ cough  Card rrr Abd soft Ext warm  Neuro awake, contracted. Not interactive     Procedure  The current trach was removed. The site inspected and new #  6 cuffless trach was placed over obturator w/out difficulty. 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 Favoring left side. Father already does PROM at home w/ flexion/extension Plan PROM tid Aiming to keep head midline     My time 36 minutes which included extensive bedside counciling to family and caregivers   Erick Colace ACNP-BC [place] Pager # [phone] OR # [phone] if no answer

## 2022-05-18 NOTE — Assessment & Plan Note (Signed)
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

## 2022-05-18 NOTE — Progress Notes (Signed)
Tracheostomy Procedure Note  Cherlyn Syring [id number] 2000-11-13  Pre Procedure Tracheostomy Information  Trach Brand: Shiley Size:  6.0 [id number] Style: Uncuffed Secured by: Velcro   Procedure: Trach cleaning and Trach Change    Post Procedure Tracheostomy Information  Trach Brand: Shiley Size:  6.0 [id number] Style: Uncuffed Secured by: Velcro   Post Procedure Evaluation:  ETCO2 positive color change from yellow to purple : Yes.   Vital signs:VSS Patients current condition: stable Complications: No apparent complications Trach site exam: clean, dry Wound care done: 4 x 4 gauze drain Patient did tolerate procedure well.   Education: Education for both parents  Prescription needs: none    Additional needs: Given 3 extra trach ties and 3 trach collars

## 2022-05-19 DIAGNOSIS — G9340 Encephalopathy, unspecified: Secondary | ICD-10-CM | POA: Diagnosis not present

## 2022-05-19 DIAGNOSIS — R159 Full incontinence of feces: Secondary | ICD-10-CM | POA: Diagnosis not present

## 2022-05-19 DIAGNOSIS — R32 Unspecified urinary incontinence: Secondary | ICD-10-CM | POA: Diagnosis not present

## 2022-05-19 DIAGNOSIS — G0481 Other encephalitis and encephalomyelitis: Secondary | ICD-10-CM | POA: Diagnosis not present

## 2022-05-19 DIAGNOSIS — Z930 Tracheostomy status: Secondary | ICD-10-CM | POA: Diagnosis not present

## 2022-05-25 DIAGNOSIS — G9340 Encephalopathy, unspecified: Secondary | ICD-10-CM | POA: Diagnosis not present

## 2022-05-25 DIAGNOSIS — Z931 Gastrostomy status: Secondary | ICD-10-CM | POA: Diagnosis not present

## 2022-05-27 DIAGNOSIS — G0481 Other encephalitis and encephalomyelitis: Secondary | ICD-10-CM | POA: Diagnosis not present

## 2022-05-27 DIAGNOSIS — R532 Functional quadriplegia: Secondary | ICD-10-CM | POA: Diagnosis not present

## 2022-06-08 DIAGNOSIS — Z931 Gastrostomy status: Secondary | ICD-10-CM | POA: Diagnosis not present

## 2022-06-08 DIAGNOSIS — G9340 Encephalopathy, unspecified: Secondary | ICD-10-CM | POA: Diagnosis not present

## 2022-06-10 DIAGNOSIS — Z931 Gastrostomy status: Secondary | ICD-10-CM | POA: Diagnosis not present

## 2022-06-10 DIAGNOSIS — G9340 Encephalopathy, unspecified: Secondary | ICD-10-CM | POA: Diagnosis not present

## 2022-06-14 DIAGNOSIS — Z930 Tracheostomy status: Secondary | ICD-10-CM | POA: Diagnosis not present

## 2022-06-14 DIAGNOSIS — Z7401 Bed confinement status: Secondary | ICD-10-CM | POA: Diagnosis not present

## 2022-06-14 DIAGNOSIS — G40909 Epilepsy, unspecified, not intractable, without status epilepticus: Secondary | ICD-10-CM | POA: Diagnosis not present

## 2022-06-14 DIAGNOSIS — Z931 Gastrostomy status: Secondary | ICD-10-CM | POA: Diagnosis not present

## 2022-06-15 DIAGNOSIS — Z930 Tracheostomy status: Secondary | ICD-10-CM | POA: Diagnosis not present

## 2022-06-15 DIAGNOSIS — G9340 Encephalopathy, unspecified: Secondary | ICD-10-CM | POA: Diagnosis not present

## 2022-06-16 DIAGNOSIS — H109 Unspecified conjunctivitis: Secondary | ICD-10-CM | POA: Diagnosis not present

## 2022-06-16 DIAGNOSIS — M6281 Muscle weakness (generalized): Secondary | ICD-10-CM | POA: Diagnosis not present

## 2022-06-16 DIAGNOSIS — Z7401 Bed confinement status: Secondary | ICD-10-CM | POA: Diagnosis not present

## 2022-06-17 DIAGNOSIS — Z79899 Other long term (current) drug therapy: Secondary | ICD-10-CM | POA: Diagnosis not present

## 2022-06-17 DIAGNOSIS — G0481 Other encephalitis and encephalomyelitis: Secondary | ICD-10-CM | POA: Diagnosis not present

## 2022-06-18 ENCOUNTER — Emergency Department (HOSPITAL_COMMUNITY)
Admission: EM | Admit: 2022-06-18 | Discharge: 2022-06-18 | Disposition: A | Discharge status: Home / Self Care | Payer: Medicaid Other | Attending: Emergency Medicine | Admitting: Emergency Medicine

## 2022-06-18 ENCOUNTER — Other Ambulatory Visit: Payer: Self-pay

## 2022-06-18 ENCOUNTER — Emergency Department (HOSPITAL_COMMUNITY): Payer: Medicaid Other

## 2022-06-18 DIAGNOSIS — Z4659 Encounter for fitting and adjustment of other gastrointestinal appliance and device: Secondary | ICD-10-CM | POA: Diagnosis not present

## 2022-06-18 DIAGNOSIS — K9423 Gastrostomy malfunction: Secondary | ICD-10-CM | POA: Diagnosis not present

## 2022-06-18 DIAGNOSIS — T85528A Displacement of other gastrointestinal prosthetic devices, implants and grafts, initial encounter: Secondary | ICD-10-CM | POA: Diagnosis not present

## 2022-06-18 DIAGNOSIS — Z931 Gastrostomy status: Secondary | ICD-10-CM

## 2022-06-18 DIAGNOSIS — Y732 Prosthetic and other implants, materials and accessory gastroenterology and urology devices associated with adverse incidents: Secondary | ICD-10-CM | POA: Diagnosis not present

## 2022-06-18 NOTE — ED Notes (Signed)
[facility] took pt and parents home. Discharge paperwork given to parents

## 2022-06-18 NOTE — ED Notes (Signed)
[facility] called  

## 2022-06-18 NOTE — ED Triage Notes (Signed)
Pt pulled her G-Tube out - needs to be replaced

## 2022-06-18 NOTE — ED Provider Triage Note (Signed)
Emergency Medicine Provider Triage Evaluation Note  Jamie [name] , a 22 y.o. female  was evaluated in triage.  Pt complains of patient with NMDA tracheotomy dependent, presents with family due to patient pulling out her G-tube, states it pulled out about 1 hour ago, states that they have no other complaints, patient been having any fevers chills cough congestion no stomach pain no nausea or vomiting.  States that it was last changed in October..  Review of Systems  Positive: G-tube removal Negative: Chest pain nausea vomiting  Physical Exam  BP 98/76   Pulse 78   Temp 98.7 F (37.1 C) (Oral)   Resp 18   Ht '5\' 3"'$  (1.6 m)   Wt 62 kg   SpO2 99%   BMI 24.21 kg/m  Gen:   Awake, no distress   Resp:  Normal effort  MSK:   Moves extremities without difficulty  Other:    Medical Decision Making  Medically screening exam initiated at 12:48 AM.  Appropriate orders placed.  Jamie [name] was informed that the remainder of the evaluation will be completed by another provider, this initial triage assessment does not replace that evaluation, and the importance of remaining in the ED until their evaluation is complete.  Patient will need new G-tube placed, I recommend rooming so this can be performed.   Jamie Fennel, PA-C 06/18/22 [phone]

## 2022-06-18 NOTE — Discharge Instructions (Signed)
Follow up with your care team for feeding tube replacement.

## 2022-06-18 NOTE — ED Provider Notes (Signed)
Jamie [name]   CSN: [id number] Arrival date & time: 06/18/22  0025     History  Chief Complaint  Patient presents with   PPD Placement    Jamie [name] is a 22 y.o. female.  22 year old female here with parents, gastrostomy tube removed 1 hour PTA, do not have a new tube for replacement. No other concerns.         Home Medications Prior to Admission medications   Medication Sig Start Date End Date Taking? Authorizing Provider  acetaminophen (TYLENOL) 160 MG/5ML solution Place 20.3 mLs (650 mg total) into feeding tube every 4 (four) hours as needed for fever. 03/16/21   Julian Hy, DO  Baclofen 5 MG TABS Take 5 mg by mouth 2 (two) times daily. 12/29/21   [provider]  ciprofloxacin (CILOXAN) 0.3 % ophthalmic solution Place 2 drops into both eyes 2 (two) times daily. 11/22/21   [provider]  enoxaparin (LOVENOX) 40 MG/0.4ML injection Inject 0.4 mLs (40 mg total) into the skin daily. 03/17/21   Julian Hy, DO  folic acid (FOLVITE) 1 MG tablet Take 1 mg by mouth daily. 12/18/21   [provider]  gabapentin (NEURONTIN) 100 MG capsule Take 100 mg by mouth 3 (three) times daily. 12/18/21   [provider]  lactulose (CHRONULAC) 10 GM/15ML solution SMARTSIG:30 Milliliter(s) By Mouth Daily PRN 12/14/21   [provider]  levETIRacetam (KEPPRA) 1500 MG/100ML SOLN Inject 50 mLs (750 mg total) into the vein every 12 (twelve) hours. 01/05/22   Ghimire, Henreitta Leber, MD  levothyroxine (SYNTHROID) 100 MCG tablet Place 1 tablet (100 mcg total) into feeding tube daily at 6 (six) AM. 03/17/21   Carlis Abbott, Venita Sheffield, DO  Mouthwashes (MOUTH RINSE) LIQD solution 15 mLs by Mouth Rinse route 2 (two) times daily. 03/16/21   Julian Hy, DO  Nutritional Supplements (FEEDING SUPPLEMENT, PROSOURCE TF,) liquid Place 45 mLs into feeding tube 3 (three) times daily. Patient taking differently: Place  237 mLs into feeding tube 5 (five) times daily. 03/16/21   Julian Hy, DO  polyethylene glycol powder (GLYCOLAX/MIRALAX) 17 GM/SCOOP powder Place 17 g into feeding tube daily. 01/05/22   Ghimire, Henreitta Leber, MD  scopolamine (TRANSDERM-SCOP) 1 MG/3DAYS Place 1 patch (1.5 mg total) onto the skin every 3 (three) days. 03/18/21   Julian Hy, DO  tenofovir (VIREAD) 300 MG tablet Place 1 tablet (300 mg total) into feeding tube daily. 03/17/21   Julian Hy, DO  tobramycin-dexamethasone [facility]) ophthalmic solution Place 1 drop into both eyes every 6 (six) hours. 12/30/21   [provider]  Water For Irrigation, Sterile (FREE WATER) SOLN Place 200 mLs into feeding tube every 6 (six) hours. 01/05/22   Ghimire, Henreitta Leber, MD      Allergies    Rituximab    Review of Systems   Review of Systems Negative except as per HPI Physical Exam Updated Vital Signs BP 104/68   Pulse 81   Temp 98.7 F (37.1 C) (Oral)   Resp 18   Ht '5\' 3"'$  (1.6 m)   Wt 62 kg   SpO2 100%   BMI 24.21 kg/m  Physical Exam Vitals and nursing [name] reviewed.  HENT:     Head: Normocephalic and atraumatic.  Pulmonary:     Effort: Pulmonary effort is normal.  Abdominal:     Palpations: Abdomen is soft.     Tenderness: There is no abdominal  tenderness.  Skin:    General: Skin is warm and dry.  Neurological:     Mental Status: Mental status is at baseline.     ED Results / Procedures / Treatments   Labs (all labs ordered are listed, but only abnormal results are displayed) Labs Reviewed - No data to display  EKG None  Radiology DG Abd Portable 1V  Result Date: 06/18/2022 CLINICAL DATA:  [id number].  Gastrostomy tube placement. EXAM: PORTABLE ABDOMEN - 1 VIEW COMPARISON:  CT with IV contrast 9 /01/2022. FINDINGS: The bowel gas pattern is nonobstructive. No radio-opaque calculi or other significant radiographic abnormality are seen. There is no supine evidence of free air. A gastrostomy tube superimposes  in the left mid abdomen. Location of the balloon is undetermined since no enteric contrast was given. On the prior CT the balloon was in the gastric antrum. Lung bases are clear.  Regional osseous structures grossly intact. IMPRESSION: Gastrostomy tube superimposes in the left mid abdomen. Location of the balloon is undetermined since no enteric contrast was given. On the prior CT the balloon was in the gastric antrum. Electronically Signed   By: Telford Nab M.D.   On: 06/18/2022 02:15    Procedures Gastrostomy tube replacement  Date/Time: 06/18/2022 1:19 AM  Performed by: Tacy Learn, PA-C Authorized by: Tacy Learn, PA-C        Medications Ordered in ED Medications - No data to display  ED Course/ Medical Decision Making/ A&P                             Medical Decision Making Amount and/or Complexity of Data Reviewed Radiology: ordered.   22 year old female brought in by EMS for G-tube displacement.  Patient with NMDA tracheotomy dependent, here with parents her [name] tube came out 1 hour ago, no other complaints, patient otherwise at baseline.  On exam, respirations even and unlabored, abdomen soft nontender, tube displaced.  Tube was replaced with a 16 French Foley, inserted without any difficulty or resistance, bulb inflated.  Discussed with attending, if tube was placed without difficulty, does not require confirmation XR. X-ray ordered to confirm placement. Xr completed with out contrast  limited exam. Foley placed within 1 hour of tube removal, placed without any resistance or pain, flushes without pain. Plan is for family to follow up with patient's care team for tube exchange with her usual tube.          Final Clinical Impression(s) / ED Diagnoses Final diagnoses:  Gastrostomy tube in place [facility])    Rx / DC Orders ED Discharge Orders     None         Tacy Learn, PA-C 06/18/22 0247    Merrily Pew, MD 06/18/22 [phone]

## 2022-06-20 DIAGNOSIS — T85598A Other mechanical complication of other gastrointestinal prosthetic devices, implants and grafts, initial encounter: Secondary | ICD-10-CM | POA: Diagnosis not present

## 2022-06-23 DIAGNOSIS — Z931 Gastrostomy status: Secondary | ICD-10-CM | POA: Diagnosis not present

## 2022-06-23 DIAGNOSIS — G9340 Encephalopathy, unspecified: Secondary | ICD-10-CM | POA: Diagnosis not present

## 2022-07-07 DIAGNOSIS — Z931 Gastrostomy status: Secondary | ICD-10-CM | POA: Diagnosis not present

## 2022-07-07 DIAGNOSIS — G9340 Encephalopathy, unspecified: Secondary | ICD-10-CM | POA: Diagnosis not present

## 2022-07-08 DIAGNOSIS — G9340 Encephalopathy, unspecified: Secondary | ICD-10-CM | POA: Diagnosis not present

## 2022-07-08 DIAGNOSIS — Z931 Gastrostomy status: Secondary | ICD-10-CM | POA: Diagnosis not present

## 2022-07-14 DIAGNOSIS — G9340 Encephalopathy, unspecified: Secondary | ICD-10-CM | POA: Diagnosis not present

## 2022-07-14 DIAGNOSIS — Z930 Tracheostomy status: Secondary | ICD-10-CM | POA: Diagnosis not present

## 2022-07-15 DIAGNOSIS — Z7401 Bed confinement status: Secondary | ICD-10-CM | POA: Diagnosis not present

## 2022-07-15 DIAGNOSIS — H16123 Filamentary keratitis, bilateral: Secondary | ICD-10-CM | POA: Diagnosis not present

## 2022-07-15 DIAGNOSIS — G0481 Other encephalitis and encephalomyelitis: Secondary | ICD-10-CM | POA: Diagnosis not present

## 2022-07-15 DIAGNOSIS — H109 Unspecified conjunctivitis: Secondary | ICD-10-CM | POA: Diagnosis not present

## 2022-07-15 DIAGNOSIS — E039 Hypothyroidism, unspecified: Secondary | ICD-10-CM | POA: Diagnosis not present

## 2022-07-15 DIAGNOSIS — Z930 Tracheostomy status: Secondary | ICD-10-CM | POA: Diagnosis not present

## 2022-07-15 DIAGNOSIS — M6281 Muscle weakness (generalized): Secondary | ICD-10-CM | POA: Diagnosis not present

## 2022-07-20 DIAGNOSIS — Z930 Tracheostomy status: Secondary | ICD-10-CM | POA: Diagnosis not present

## 2022-07-20 DIAGNOSIS — Z931 Gastrostomy status: Secondary | ICD-10-CM | POA: Diagnosis not present

## 2022-07-20 DIAGNOSIS — R053 Chronic cough: Secondary | ICD-10-CM | POA: Diagnosis not present

## 2022-07-20 DIAGNOSIS — Z7401 Bed confinement status: Secondary | ICD-10-CM | POA: Diagnosis not present

## 2022-07-20 DIAGNOSIS — G40909 Epilepsy, unspecified, not intractable, without status epilepticus: Secondary | ICD-10-CM | POA: Diagnosis not present

## 2022-07-24 DIAGNOSIS — G9340 Encephalopathy, unspecified: Secondary | ICD-10-CM | POA: Diagnosis not present

## 2022-07-24 DIAGNOSIS — Z931 Gastrostomy status: Secondary | ICD-10-CM | POA: Diagnosis not present

## 2022-07-25 DIAGNOSIS — R159 Full incontinence of feces: Secondary | ICD-10-CM | POA: Diagnosis not present

## 2022-07-25 DIAGNOSIS — R32 Unspecified urinary incontinence: Secondary | ICD-10-CM | POA: Diagnosis not present

## 2022-08-04 ENCOUNTER — Inpatient Hospital Stay (HOSPITAL_COMMUNITY)
Admission: RE | Admit: 2022-08-04 | Discharge: 2022-08-04 | Disposition: A | Discharge status: Home / Self Care | Payer: Medicaid Other | Source: Ambulatory Visit

## 2022-08-04 DIAGNOSIS — M436 Torticollis: Secondary | ICD-10-CM | POA: Diagnosis present

## 2022-08-04 DIAGNOSIS — G9349 Other encephalopathy: Secondary | ICD-10-CM | POA: Diagnosis present

## 2022-08-04 DIAGNOSIS — J9611 Chronic respiratory failure with hypoxia: Secondary | ICD-10-CM | POA: Diagnosis present

## 2022-08-04 DIAGNOSIS — Z930 Tracheostomy status: Secondary | ICD-10-CM

## 2022-08-05 DIAGNOSIS — G9340 Encephalopathy, unspecified: Secondary | ICD-10-CM | POA: Diagnosis not present

## 2022-08-05 DIAGNOSIS — Z930 Tracheostomy status: Secondary | ICD-10-CM | POA: Diagnosis not present

## 2022-08-07 DIAGNOSIS — Z931 Gastrostomy status: Secondary | ICD-10-CM | POA: Diagnosis not present

## 2022-08-07 DIAGNOSIS — G9340 Encephalopathy, unspecified: Secondary | ICD-10-CM | POA: Diagnosis not present

## 2022-08-08 DIAGNOSIS — K117 Disturbances of salivary secretion: Secondary | ICD-10-CM | POA: Diagnosis not present

## 2022-08-08 DIAGNOSIS — R6889 Other general symptoms and signs: Secondary | ICD-10-CM | POA: Diagnosis not present

## 2022-08-08 DIAGNOSIS — Z430 Encounter for attention to tracheostomy: Secondary | ICD-10-CM | POA: Diagnosis not present

## 2022-08-08 DIAGNOSIS — M6289 Other specified disorders of muscle: Secondary | ICD-10-CM | POA: Diagnosis not present

## 2022-08-08 DIAGNOSIS — Z930 Tracheostomy status: Secondary | ICD-10-CM | POA: Diagnosis not present

## 2022-08-08 DIAGNOSIS — E039 Hypothyroidism, unspecified: Secondary | ICD-10-CM | POA: Diagnosis not present

## 2022-08-08 DIAGNOSIS — G0481 Other encephalitis and encephalomyelitis: Secondary | ICD-10-CM | POA: Diagnosis not present

## 2022-08-10 ENCOUNTER — Inpatient Hospital Stay (HOSPITAL_COMMUNITY)
Admission: RE | Admit: 2022-08-10 | Discharge: 2022-08-10 | Disposition: A | Discharge status: Home / Self Care | Payer: Medicaid Other | Source: Ambulatory Visit

## 2022-08-10 DIAGNOSIS — Z931 Gastrostomy status: Secondary | ICD-10-CM | POA: Diagnosis not present

## 2022-08-10 DIAGNOSIS — G9340 Encephalopathy, unspecified: Secondary | ICD-10-CM | POA: Diagnosis not present

## 2022-08-15 DIAGNOSIS — Z7401 Bed confinement status: Secondary | ICD-10-CM | POA: Diagnosis not present

## 2022-08-15 DIAGNOSIS — H109 Unspecified conjunctivitis: Secondary | ICD-10-CM | POA: Diagnosis not present

## 2022-08-15 DIAGNOSIS — M6281 Muscle weakness (generalized): Secondary | ICD-10-CM | POA: Diagnosis not present

## 2022-08-19 DIAGNOSIS — G0481 Other encephalitis and encephalomyelitis: Secondary | ICD-10-CM | POA: Diagnosis not present

## 2022-08-24 DIAGNOSIS — Z930 Tracheostomy status: Secondary | ICD-10-CM | POA: Diagnosis not present

## 2022-08-24 DIAGNOSIS — G9340 Encephalopathy, unspecified: Secondary | ICD-10-CM | POA: Diagnosis not present

## 2022-08-25 DIAGNOSIS — G0481 Other encephalitis and encephalomyelitis: Secondary | ICD-10-CM | POA: Diagnosis not present

## 2022-08-25 DIAGNOSIS — Z931 Gastrostomy status: Secondary | ICD-10-CM | POA: Diagnosis not present

## 2022-08-25 DIAGNOSIS — Z930 Tracheostomy status: Secondary | ICD-10-CM | POA: Diagnosis not present

## 2022-08-25 DIAGNOSIS — R159 Full incontinence of feces: Secondary | ICD-10-CM | POA: Diagnosis not present

## 2022-08-25 DIAGNOSIS — T7849XA Other allergy, initial encounter: Secondary | ICD-10-CM | POA: Diagnosis not present

## 2022-08-25 DIAGNOSIS — R32 Unspecified urinary incontinence: Secondary | ICD-10-CM | POA: Diagnosis not present

## 2022-08-25 DIAGNOSIS — G40909 Epilepsy, unspecified, not intractable, without status epilepticus: Secondary | ICD-10-CM | POA: Diagnosis not present

## 2022-08-25 DIAGNOSIS — Z7401 Bed confinement status: Secondary | ICD-10-CM | POA: Diagnosis not present

## 2022-08-25 DIAGNOSIS — E063 Autoimmune thyroiditis: Secondary | ICD-10-CM | POA: Diagnosis not present

## 2022-08-26 NOTE — Addendum Note (Signed)
Encounter addended by: Simonne Martinet, NP on: 08/26/2022 11:17 AM  Actions taken: Delete clinical note

## 2022-08-29 DIAGNOSIS — R532 Functional quadriplegia: Secondary | ICD-10-CM | POA: Diagnosis not present

## 2022-09-07 DIAGNOSIS — Z931 Gastrostomy status: Secondary | ICD-10-CM | POA: Diagnosis not present

## 2022-09-07 DIAGNOSIS — G9340 Encephalopathy, unspecified: Secondary | ICD-10-CM | POA: Diagnosis not present

## 2022-09-09 DIAGNOSIS — Z931 Gastrostomy status: Secondary | ICD-10-CM | POA: Diagnosis not present

## 2022-09-09 DIAGNOSIS — G9340 Encephalopathy, unspecified: Secondary | ICD-10-CM | POA: Diagnosis not present

## 2022-09-25 DIAGNOSIS — G0481 Other encephalitis and encephalomyelitis: Secondary | ICD-10-CM | POA: Diagnosis not present

## 2022-09-25 DIAGNOSIS — R32 Unspecified urinary incontinence: Secondary | ICD-10-CM | POA: Diagnosis not present

## 2022-09-25 DIAGNOSIS — R159 Full incontinence of feces: Secondary | ICD-10-CM | POA: Diagnosis not present

## 2022-09-29 DIAGNOSIS — R532 Functional quadriplegia: Secondary | ICD-10-CM | POA: Diagnosis not present

## 2022-10-07 DIAGNOSIS — Z931 Gastrostomy status: Secondary | ICD-10-CM | POA: Diagnosis not present

## 2022-10-07 DIAGNOSIS — G0490 Encephalitis and encephalomyelitis, unspecified: Secondary | ICD-10-CM | POA: Diagnosis not present

## 2022-10-07 DIAGNOSIS — Z930 Tracheostomy status: Secondary | ICD-10-CM | POA: Diagnosis not present

## 2022-10-07 DIAGNOSIS — Z7401 Bed confinement status: Secondary | ICD-10-CM | POA: Diagnosis not present

## 2022-10-24 DIAGNOSIS — G9340 Encephalopathy, unspecified: Secondary | ICD-10-CM | POA: Diagnosis not present

## 2022-10-24 DIAGNOSIS — Z931 Gastrostomy status: Secondary | ICD-10-CM | POA: Diagnosis not present

## 2022-10-24 DIAGNOSIS — Z930 Tracheostomy status: Secondary | ICD-10-CM | POA: Diagnosis not present

## 2022-10-26 ENCOUNTER — Encounter (HOSPITAL_COMMUNITY): Payer: Self-pay

## 2022-10-26 ENCOUNTER — Emergency Department (HOSPITAL_COMMUNITY)
Admission: EM | Admit: 2022-10-26 | Discharge: 2022-10-27 | Disposition: A | Discharge status: Home / Self Care | Payer: Medicaid Other | Attending: Emergency Medicine | Admitting: Emergency Medicine

## 2022-10-26 ENCOUNTER — Emergency Department (HOSPITAL_COMMUNITY): Payer: Medicaid Other

## 2022-10-26 DIAGNOSIS — K9423 Gastrostomy malfunction: Secondary | ICD-10-CM | POA: Diagnosis not present

## 2022-10-26 DIAGNOSIS — R159 Full incontinence of feces: Secondary | ICD-10-CM | POA: Diagnosis not present

## 2022-10-26 DIAGNOSIS — R6889 Other general symptoms and signs: Secondary | ICD-10-CM | POA: Insufficient documentation

## 2022-10-26 DIAGNOSIS — Z931 Gastrostomy status: Secondary | ICD-10-CM | POA: Diagnosis not present

## 2022-10-26 DIAGNOSIS — N83202 Unspecified ovarian cyst, left side: Secondary | ICD-10-CM | POA: Diagnosis not present

## 2022-10-26 DIAGNOSIS — G0481 Other encephalitis and encephalomyelitis: Secondary | ICD-10-CM | POA: Diagnosis not present

## 2022-10-26 DIAGNOSIS — R1084 Generalized abdominal pain: Secondary | ICD-10-CM | POA: Diagnosis not present

## 2022-10-26 DIAGNOSIS — R9431 Abnormal electrocardiogram [ECG] [EKG]: Secondary | ICD-10-CM | POA: Diagnosis not present

## 2022-10-26 DIAGNOSIS — K9420 Gastrostomy complication, unspecified: Secondary | ICD-10-CM | POA: Diagnosis not present

## 2022-10-26 DIAGNOSIS — R32 Unspecified urinary incontinence: Secondary | ICD-10-CM | POA: Diagnosis not present

## 2022-10-26 DIAGNOSIS — N200 Calculus of kidney: Secondary | ICD-10-CM | POA: Diagnosis not present

## 2022-10-26 LAB — CBC WITH DIFFERENTIAL/PLATELET
Abs Immature Granulocytes: 0.02 10*3/uL (ref 0.00–0.07)
Basophils Absolute: 0 10*3/uL (ref 0.0–0.1)
Basophils Relative: 1 %
Eosinophils Absolute: 0.7 10*3/uL — ABNORMAL HIGH (ref 0.0–0.5)
Eosinophils Relative: 8 %
HCT: 32.1 % — ABNORMAL LOW (ref 36.0–46.0)
Hemoglobin: 9 g/dL — ABNORMAL LOW (ref 12.0–15.0)
Immature Granulocytes: 0 %
Lymphocytes Relative: 21 %
Lymphs Abs: 1.6 10*3/uL (ref 0.7–4.0)
MCH: 22.7 pg — ABNORMAL LOW (ref 26.0–34.0)
MCHC: 28 g/dL — ABNORMAL LOW (ref 30.0–36.0)
MCV: 80.9 fL (ref 80.0–100.0)
Monocytes Absolute: 0.6 10*3/uL (ref 0.1–1.0)
Monocytes Relative: 8 %
Neutro Abs: 4.7 10*3/uL (ref 1.7–7.7)
Neutrophils Relative %: 62 %
Platelets: 373 10*3/uL (ref 150–400)
RBC: 3.97 MIL/uL (ref 3.87–5.11)
RDW: 15.4 % (ref 11.5–15.5)
WBC: 7.7 10*3/uL (ref 4.0–10.5)
nRBC: 0 % (ref 0.0–0.2)

## 2022-10-26 LAB — COMPREHENSIVE METABOLIC PANEL
ALT: 34 U/L (ref 0–44)
AST: 24 U/L (ref 15–41)
Albumin: 3.4 g/dL — ABNORMAL LOW (ref 3.5–5.0)
Alkaline Phosphatase: 70 U/L (ref 38–126)
Anion gap: 9 (ref 5–15)
BUN: 13 mg/dL (ref 6–20)
CO2: 23 mmol/L (ref 22–32)
Calcium: 9.2 mg/dL (ref 8.9–10.3)
Chloride: 104 mmol/L (ref 98–111)
Creatinine, Ser: 0.62 mg/dL (ref 0.44–1.00)
GFR, Estimated: >60 mL/min (ref >60)
Glucose, Bld: 93 mg/dL (ref 70–99)
Potassium: 3.9 mmol/L (ref 3.5–5.1)
Sodium: 136 mmol/L (ref 135–145)
Total Bilirubin: 0.6 mg/dL (ref 0.3–1.2)
Total Protein: 6.6 g/dL (ref 6.5–8.1)

## 2022-10-26 LAB — I-STAT CHEM 8, ED
BUN: 14 mg/dL (ref 6–20)
Calcium, Ion: 1.08 mmol/L — ABNORMAL LOW (ref 1.15–1.40)
Chloride: 107 mmol/L (ref 98–111)
Creatinine, Ser: 0.6 mg/dL (ref 0.44–1.00)
Glucose, Bld: 87 mg/dL (ref 70–99)
HCT: 33 % — ABNORMAL LOW (ref 36.0–46.0)
Hemoglobin: 11.2 g/dL — ABNORMAL LOW (ref 12.0–15.0)
Potassium: 4.1 mmol/L (ref 3.5–5.1)
Sodium: 140 mmol/L (ref 135–145)
TCO2: 24 mmol/L (ref 22–32)

## 2022-10-26 LAB — URINALYSIS, ROUTINE W REFLEX MICROSCOPIC
Bilirubin Urine: NEGATIVE
Glucose, UA: NEGATIVE mg/dL
Hgb urine dipstick: NEGATIVE
Ketones, ur: NEGATIVE mg/dL
Nitrite: NEGATIVE
Protein, ur: 30 mg/dL — AB
Specific Gravity, Urine: 1.042 — ABNORMAL HIGH (ref 1.005–1.030)
pH: 6 (ref 5.0–8.0)

## 2022-10-26 LAB — HCG, SERUM, QUALITATIVE: Preg, Serum: NEGATIVE

## 2022-10-26 LAB — LACTIC ACID, PLASMA: Lactic Acid, Venous: 1.7 mmol/L (ref 0.5–1.9)

## 2022-10-26 MED ORDER — IOHEXOL 350 MG/ML SOLN
60.0000 mL | Freq: Once | INTRAVENOUS | Status: AC | PRN
  Administered 2022-10-26: 60 mL via INTRAVENOUS

## 2022-10-26 MED ORDER — SODIUM CHLORIDE 0.9 % IV SOLN
2.0000 g | Freq: Once | INTRAVENOUS | Status: AC
  Administered 2022-10-26: 2 g via INTRAVENOUS
  Filled 2022-10-26 (×2): qty 12.5

## 2022-10-26 MED ORDER — LACTATED RINGERS IV BOLUS
1000.0000 mL | Freq: Once | INTRAVENOUS | Status: AC
  Administered 2022-10-26: 1000 mL via INTRAVENOUS

## 2022-10-26 MED ORDER — METRONIDAZOLE 500 MG/100ML IV SOLN
500.0000 mg | Freq: Once | INTRAVENOUS | Status: AC
  Administered 2022-10-26: 500 mg via INTRAVENOUS
  Filled 2022-10-26: qty 100

## 2022-10-26 NOTE — ED Notes (Signed)
Feeding tube flushed with no problem.

## 2022-10-26 NOTE — ED Provider Notes (Signed)
Care of patient received from prior provider at 3:28 PM, please see their note for complete H/P and care plan.  Received handoff per ED course.  Clinical Course as of 10/26/22 2052  Wed Oct 26, 2022  1525 Stable HO from RP  Displaced GTUBE with possible infection. Gtube not working earlier today. NMDA encephalitis now S/P Trach/PEG  CT AP pending. Code sepsis activation for possible intraabdominal fluid infection.   UA CT pending follow up infection. Dispo per results. [CC]  1714 CT critical  [CC]  1842 Reassessed at bedside.  She appears to have a very tender abdominal exam. [CC]    Clinical Course User Index [CC] Glyn Ade, MD    Reassessment: CT abdomen pelvis with diffuse pneumatosis.  Consulted general surgery and gastroenterology.  They stated this is likely from misplacement of the G-tube.  Appears well-placed on repeat CT scan.  Gastroenterology and general surgery okay with utilization of G-tube in current state and outpatient follow-up with PCP.  Flushed in emergency department successful and plan is for outpatient follow-up with primary providers in outpatient setting for reassessment.  Patient to return if she has any difficulty with tube feeds overnight.  Disposition:  I have considered need for hospitalization, however, considering all of the above, I believe this patient is stable for discharge at this time.  Patient/family educated about specific return precautions for given chief complaint and symptoms.  Patient/family educated about follow-up with PCP and gen surg.     Patient/family expressed understanding of return precautions and need for follow-up. Patient spoken to regarding all imaging and laboratory results and appropriate follow up for these results. All education provided in verbal form with additional information in written form. Time was allowed for answering of patient questions. Patient discharged.    Emergency Department Medication Summary:    Medications  ceFEPIme (MAXIPIME) 2 g in sodium chloride 0.9 % 100 mL IVPB (0 g Intravenous Stopped 10/26/22 1541)  metroNIDAZOLE (FLAGYL) IVPB 500 mg (0 mg Intravenous Stopped 10/26/22 1645)  iohexol (OMNIPAQUE) 350 MG/ML injection 60 mL (60 mLs Intravenous Contrast Given 10/26/22 1633)  lactated ringers bolus 1,000 mL (1,000 mLs Intravenous New Bag/Given 10/26/22 1942)            Glyn Ade, MD 10/26/22 2108

## 2022-10-26 NOTE — ED Notes (Signed)
Pt is a difficult IV stick, Eloise Harman, MD notified, missed attempt.

## 2022-10-26 NOTE — ED Notes (Signed)
Awaiting [facility] at this time. Mom at bedside. Pt resting with regular/unlabored respirations. Suction at bedside as needed. Changed gauze dressing for trach per mother request. Vitals re-assessed. Explained to mother that awaiting transport at this time. Verbalized understanding. No further needs.

## 2022-10-26 NOTE — Consult Note (Signed)
Reason for Consult:abd pain Referring Physician: Dr. Frederik Pear is an 22 y.o. female.  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  Pt's dad states she's had Normal feeding via syringe this morning.  States that on the second feeding she has some difficulty with gravity feedings.  He tried to aspirate as well as flush however was not functioning properly.  Patient was having some around her G-tube site.  Patient was brought to the ER secondary to dislodged G-tube.  On evaluation the ER she underwent CT scan as well as manipulation of the G-tube.  CT scan was significant for pneumatosis and portal venous gas.  After G-tube was replaced.  Patient had no significant pain.  There is no signs of guarding.  Patient with normal white count workup as well as normal lactate.  In discussing with the patient's family members her pain appears to have improved.  I did review the patient's CT scan and laboratory studies personally  Past Medical History:  Diagnosis Date   Acquired autoimmune hypothyroidism    Dx 12/2014, TSH 110, FT4 0.3    Past Surgical History:  Procedure Laterality Date   IR GASTROSTOMY TUBE MOD SED  03/16/2021    Family History  Problem Relation Age of Onset   Healthy Mother    Healthy Father     Social History:  reports that she has never smoked. She has never used smokeless tobacco. She reports that she does not currently use alcohol. She reports that she does not currently use drugs.  Allergies:  Allergies  Allergen Reactions   Rituximab Other (See Comments)    Fever / Tachycardia / Infusion reaction after dose administered on 03/03/2021.  Patient was premedicated.  Infusion eventually completed at a slow rate / multiple interruptions.    Medications: I have reviewed the patient's current  medications.  Results for orders placed or performed during the hospital encounter of 10/26/22 (from the past 48 hour(s))  I-stat chem 8, ED (not at [facility], [facility] or [facility])     Status: Abnormal   Collection Time: 10/26/22  2:08 PM  Result Value Ref Range   Sodium 140 135 - 145 mmol/L   Potassium 4.1 3.5 - 5.1 mmol/L   Chloride 107 98 - 111 mmol/L   BUN 14 6 - 20 mg/dL   Creatinine, Ser 1.30 0.44 - 1.00 mg/dL   Glucose, Bld 87 70 - 99 mg/dL    Comment: Glucose reference range applies only to samples taken after fasting for at least 8 hours.   Calcium, Ion 1.08 (L) 1.15 - 1.40 mmol/L   TCO2 24 22 - 32 mmol/L   Hemoglobin 11.2 (L) 12.0 - 15.0 g/dL   HCT 86.5 (L) 78.4 - 69.6 %  Lactic acid, plasma     Status: None   Collection Time: 10/26/22  2:23 PM  Result Value Ref Range   Lactic Acid, Venous 1.7 0.5 - 1.9 mmol/L    Comment: Performed at [facility], [address] [place], [place] [address]  Comprehensive metabolic panel     Status: Abnormal   Collection Time: 10/26/22  2:25 PM  Result Value Ref Range   Sodium 136 135 - 145 mmol/L   Potassium 3.9 3.5 - 5.1 mmol/L   Chloride 104 98 - 111 mmol/L   CO2 23 22 - 32 mmol/L   Glucose,  Bld 93 70 - 99 mg/dL    Comment: Glucose reference range applies only to samples taken after fasting for at least 8 hours.   BUN 13 6 - 20 mg/dL   Creatinine, Ser 4.09 0.44 - 1.00 mg/dL   Calcium 9.2 8.9 - 81.1 mg/dL   Total Protein 6.6 6.5 - 8.1 g/dL   Albumin 3.4 (L) 3.5 - 5.0 g/dL   AST 24 15 - 41 U/L   ALT 34 0 - 44 U/L   Alkaline Phosphatase 70 38 - 126 U/L   Total Bilirubin 0.6 0.3 - 1.2 mg/dL   GFR, Estimated >91 >47 mL/min    Comment: (NOTE) Calculated using the CKD-EPI Creatinine Equation (2021)    Anion gap 9 5 - 15    Comment: Performed at [facility], [address], [place], [place] [address]  CBC with Differential     Status: Abnormal   Collection Time: 10/26/22  2:25 PM  Result Value Ref Range   WBC 7.7 4.0 - 10.5 K/uL    RBC 3.97 3.87 - 5.11 MIL/uL   Hemoglobin 9.0 (L) 12.0 - 15.0 g/dL   HCT 21.3 (L) 08.6 - 57.8 %   MCV 80.9 80.0 - 100.0 fL   MCH 22.7 (L) 26.0 - 34.0 pg   MCHC 28.0 (L) 30.0 - 36.0 g/dL   RDW 46.9 62.9 - 52.8 %   Platelets 373 150 - 400 K/uL   nRBC 0.0 0.0 - 0.2 %   Neutrophils Relative % 62 %   Neutro Abs 4.7 1.7 - 7.7 K/uL   Lymphocytes Relative 21 %   Lymphs Abs 1.6 0.7 - 4.0 K/uL   Monocytes Relative 8 %   Monocytes Absolute 0.6 0.1 - 1.0 K/uL   Eosinophils Relative 8 %   Eosinophils Absolute 0.7 (H) 0.0 - 0.5 K/uL   Basophils Relative 1 %   Basophils Absolute 0.0 0.0 - 0.1 K/uL   Immature Granulocytes 0 %   Abs Immature Granulocytes 0.02 0.00 - 0.07 K/uL    Comment: Performed at [facility], [address] [place], [place] [address]  hCG, serum, qualitative     Status: None   Collection Time: 10/26/22  2:25 PM  Result Value Ref Range   Preg, Serum NEGATIVE NEGATIVE    Comment:        THE SENSITIVITY OF THIS METHODOLOGY IS >10 mIU/mL. Performed at [facility], [address] [place], [place] [address]   Urinalysis, Routine w reflex microscopic -Urine, Clean Catch     Status: Abnormal   Collection Time: 10/26/22  4:07 PM  Result Value Ref Range   Color, Urine YELLOW YELLOW   APPearance CLEAR CLEAR   Specific Gravity, Urine 1.042 (H) 1.005 - 1.030   pH 6.0 5.0 - 8.0   Glucose, UA NEGATIVE NEGATIVE mg/dL   Hgb urine dipstick NEGATIVE NEGATIVE   Bilirubin Urine NEGATIVE NEGATIVE   Ketones, ur NEGATIVE NEGATIVE mg/dL   Protein, ur 30 (A) NEGATIVE mg/dL   Nitrite NEGATIVE NEGATIVE   Leukocytes,Ua TRACE (A) NEGATIVE   RBC / HPF 0-5 0 - 5 RBC/hpf   WBC, UA 6-10 0 - 5 WBC/hpf   Bacteria, UA RARE (A) NONE SEEN   Squamous Epithelial / HPF 0-5 0 - 5 /HPF   Mucus PRESENT     Comment: Performed at [facility], [address], [place], [place] [address]    CT ABDOMEN PELVIS W CONTRAST  Result Date:  10/26/2022 CLINICAL DATA:  Concern for  malpositioned G-tube with infection EXAM: CT ABDOMEN AND PELVIS WITH CONTRAST TECHNIQUE: Multidetector CT imaging of the abdomen and pelvis was performed using the standard protocol following bolus administration of intravenous contrast. RADIATION DOSE REDUCTION: This exam was performed according to the departmental dose-optimization program which includes automated exposure control, adjustment of the mA and/or kV according to patient size and/or use of iterative reconstruction technique. CONTRAST:  60mL OMNIPAQUE IOHEXOL 350 MG/ML SOLN COMPARISON:  CT abdomen and pelvis 01/02/2022 FINDINGS: Lower chest: No acute abnormality. Hepatobiliary: Portal venous gas is present within the liver. Cholecystectomy. No biliary dilation. Pancreas: Unremarkable. Spleen: Unremarkable. Adrenals/Urinary Tract: Normal adrenal glands. Bilateral nonobstructing calyceal stones. No hydronephrosis. Unremarkable bladder. Stomach/Bowel: G-tube in good position with balloon in the stomach. Minimal stranding along the G-tube tract is within normal limits. No abscess or organized fluid collection. Normal caliber large and small bowel. Normal appendix. Pneumatosis within the ascending and proximal transverse colon near the hepatic flexure. Question additional pneumatosis within the anterior wall of the rectum (series 3/image 78). No bowel wall thickening. No adjacent pericolonic inflammation or free fluid. No definite free intraperitoneal air. Vascular/Lymphatic: There is portal venous gas within the right ileocolic mesentery (series 3/image 53 and 3/56). More extensive portal venous gas along the mid transverse colon (series 6/image 38). Normal caliber aorta. No lymphadenopathy. Reproductive: 3.1 cm cyst in the left ovary. No follow-up recommended. Unremarkable right ovary and uterus. Other: No free intraperitoneal fluid or air. Musculoskeletal: No acute osseous abnormality. IMPRESSION: 1. Pneumatosis within the ascending and proximal  transverse colon and possibly within the rectum. Additional portal venous gas. There is no associated free intraperitoneal fluid, bowel wall thickening, or pericolonic stranding suggesting this may be benign pneumatosis. However colonic ischemia can not be excluded. 2. G-tube in good position with balloon in the stomach. 3. Bilateral nonobstructing calyceal stones. These results were called by telephone at the time of interpretation on 10/26/2022 at 5:27 pm to provider DR Doran Durand , who verbally acknowledged these results. Electronically Signed   By: Minerva Fester M.D.   On: 10/26/2022 17:28    Review of Systems  Unable to perform ROS: Patient nonverbal   Blood pressure (!) 99/51, pulse 89, temperature 99.9 F (37.7 C), temperature source Rectal, resp. rate (!) 21, SpO2 100 %. Physical Exam Constitutional:      Appearance: She is well-developed.     Comments: Conversant No acute distress  HENT:     Head: Normocephalic and atraumatic.  Eyes:     General: Lids are normal. No scleral icterus.    Pupils: Pupils are equal, round, and reactive to light.     Comments: Pupils are equal round and reactive No lid lag Moist conjunctiva  Neck:     Thyroid: No thyromegaly.     Trachea: No tracheal tenderness.     Comments: No cervical lymphadenopathy Cardiovascular:     Rate and Rhythm: Normal rate and regular rhythm.     Heart sounds: No murmur heard. Pulmonary:     Effort: Pulmonary effort is normal.     Breath sounds: Normal breath sounds. No wheezing or rales.  Abdominal:     Tenderness: There is no abdominal tenderness. There is no guarding or rebound.     Hernia: No hernia is present.       Comments: No peritoneal signs on exam   Musculoskeletal:     Cervical back: Normal range of motion and neck supple.  Skin:  General: Skin is warm.     Findings: No rash.     Nails: There is no clubbing.     Comments: Normal skin turgor  Neurological:     Mental Status: She is alert and  oriented to person, place, and time.     Comments: Normal gait and station  Psychiatric:        Mood and Affect: Mood normal.        Thought Content: Thought content normal.        Judgment: Judgment normal.     Comments: Appropriate affect     Assessment/Plan: 22 year old female with a history of anti-NMDA encephalitis, seizures, baseline quadriplegia, PEG and trach tube dependence.  She comes in for dislodged G-tube.  1.  Reviewed the patient's CT scan and her history.  It appears that she has likely pneumatosis secondary to migration and/or manipulation of the G-tube.  At this point would not recommend any emergent surgery.  Patient otherwise is afebrile, has normal laboratory studies and a benign abdomen.  2.  Okay to resume feeds when tube position confirmed per tube feed study. 3.  Patient likely can be discharged.  I discussed the patient's plan with Dr. Doran Durand in the ER.  Axel Filler 10/26/2022, 7:38 PM

## 2022-10-26 NOTE — ED Provider Notes (Signed)
[place] EMERGENCY DEPARTMENT AT [facility] Provider Note   CSN: [id number] Arrival date & time: 10/26/22  1319     History {Add pertinent medical, surgical, social history, OB history to HPI:1} Chief Complaint  Patient presents with   G Tube Out    Jamie [name] is a 22 y.o. female.  22 year old female with history of anti-NMDA encephalitis, seizures, baseline quadriplegia, PEG and trach tube dependence who presents to the emergency department with abdominal pain and dislodged G-tube.  Family reports that at 8 AM she pulled out her G-tube.  They replaced it immediately but said that they were unable to flush it and she was having significant pain.  Says that this is atypical for her with her tube feeds.  No vomiting.  No other recent illnesses.       Home Medications Prior to Admission medications   Medication Sig Start Date End Date Taking? Authorizing Provider  acetaminophen (TYLENOL) 160 MG/5ML solution Place 20.3 mLs (650 mg total) into feeding tube every 4 (four) hours as needed for fever. 03/16/21   Steffanie Dunn, DO  Baclofen 5 MG TABS Take 5 mg by mouth 2 (two) times daily. 12/29/21   [provider]  ciprofloxacin (CILOXAN) 0.3 % ophthalmic solution Place 2 drops into both eyes 2 (two) times daily. 11/22/21   [provider]  enoxaparin (LOVENOX) 40 MG/0.4ML injection Inject 0.4 mLs (40 mg total) into the skin daily. 03/17/21   Steffanie Dunn, DO  folic acid (FOLVITE) 1 MG tablet Take 1 mg by mouth daily. 12/18/21   [provider]  gabapentin (NEURONTIN) 100 MG capsule Take 100 mg by mouth 3 (three) times daily. 12/18/21   [provider]  lactulose (CHRONULAC) 10 GM/15ML solution SMARTSIG:30 Milliliter(s) By Mouth Daily PRN 12/14/21   [provider]  levETIRacetam (KEPPRA) 1500 MG/100ML SOLN Inject 50 mLs (750 mg total) into the vein every 12 (twelve) hours. 01/05/22   Ghimire, Werner Lean, MD  levothyroxine  (SYNTHROID) 100 MCG tablet Place 1 tablet (100 mcg total) into feeding tube daily at 6 (six) AM. 03/17/21   Chestine Spore, Virl Axe, DO  Mouthwashes (MOUTH RINSE) LIQD solution 15 mLs by Mouth Rinse route 2 (two) times daily. 03/16/21   Steffanie Dunn, DO  Nutritional Supplements (FEEDING SUPPLEMENT, PROSOURCE TF,) liquid Place 45 mLs into feeding tube 3 (three) times daily. Patient taking differently: Place 237 mLs into feeding tube 5 (five) times daily. 03/16/21   Steffanie Dunn, DO  polyethylene glycol powder (GLYCOLAX/MIRALAX) 17 GM/SCOOP powder Place 17 g into feeding tube daily. 01/05/22   Ghimire, Werner Lean, MD  scopolamine (TRANSDERM-SCOP) 1 MG/3DAYS Place 1 patch (1.5 mg total) onto the skin every 3 (three) days. 03/18/21   Steffanie Dunn, DO  tenofovir (VIREAD) 300 MG tablet Place 1 tablet (300 mg total) into feeding tube daily. 03/17/21   Steffanie Dunn, DO  tobramycin-dexamethasone [facility]) ophthalmic solution Place 1 drop into both eyes every 6 (six) hours. 12/30/21   [provider]  Water For Irrigation, Sterile (FREE WATER) SOLN Place 200 mLs into feeding tube every 6 (six) hours. 01/05/22   Ghimire, Werner Lean, MD      Allergies    Rituximab    Review of Systems   Review of Systems  Physical Exam Updated Vital Signs Temp 100.3 F (37.9 C) (Axillary)  Physical Exam Vitals and nursing note reviewed.  Constitutional:      General: She is not in acute distress.  Appearance: She is well-developed.     Comments: Tracheostomy in place.  Normal work of breathing.  HENT:     Head: Normocephalic and atraumatic.     Right Ear: External ear normal.     Left Ear: External ear normal.     Nose: Nose normal.  Eyes:     Extraocular Movements: Extraocular movements intact.     Conjunctiva/sclera: Conjunctivae normal.     Pupils: Pupils are equal, round, and reactive to light.  Cardiovascular:     Rate and Rhythm: Normal rate and regular rhythm.  Pulmonary:     Effort: Pulmonary  effort is normal. No respiratory distress.  Abdominal:     General: Abdomen is flat. There is no distension.     Palpations: Abdomen is soft. There is no mass.     Tenderness: There is abdominal tenderness (Diffuse). There is no guarding.     Comments: G-tube removed with small amount of blood.  G-tube was cleaned and replaced without difficulty.  No pain with replacement.  Musculoskeletal:     Cervical back: Normal range of motion and neck supple.     Right lower leg: No edema.     Left lower leg: No edema.  Skin:    General: Skin is warm and dry.  Neurological:     Mental Status: She is alert. Mental status is at baseline.  Psychiatric:        Mood and Affect: Mood normal.     ED Results / Procedures / Treatments   Labs (all labs ordered are listed, but only abnormal results are displayed) Labs Reviewed  CULTURE, BLOOD (ROUTINE X 2)  CULTURE, BLOOD (ROUTINE X 2)  URINE CULTURE  LACTIC ACID, PLASMA  LACTIC ACID, PLASMA  COMPREHENSIVE METABOLIC PANEL  CBC WITH DIFFERENTIAL/PLATELET  URINALYSIS, ROUTINE W REFLEX MICROSCOPIC  HCG, SERUM, QUALITATIVE  I-STAT CHEM 8, ED    EKG None  Radiology No results found.  Procedures Procedures  {Document cardiac monitor, telemetry assessment procedure when appropriate:1}  Medications Ordered in ED Medications  ceFEPIme (MAXIPIME) 2 g in sodium chloride 0.9 % 100 mL IVPB (has no administration in time range)  metroNIDAZOLE (FLAGYL) IVPB 500 mg (has no administration in time range)    ED Course/ Medical Decision Making/ A&P Clinical Course as of 10/26/22 1617  Wed Oct 26, 2022  1525 Stable HO from RP  Displaced GTUBE with possible infection. Gtube not working earlier today. NMDA encephalitis now S/P Trach/PEG  CT AP pending. Code sepsis activation for possible intraabdominal fluid infection.   UA CT pending follow up infection. Dispo per results. [CC]    Clinical Course User Index [CC] Glyn Ade, MD   {    Click here for ABCD2, HEART and other calculatorsREFRESH Note before signing :1}                          Medical Decision Making Amount and/or Complexity of Data Reviewed Labs: ordered. Radiology: ordered. ECG/medicine tests: ordered.  Risk Prescription drug management.   ***  {Document critical care time when appropriate:1} {Document review of labs and clinical decision tools ie heart score, Chads2Vasc2 etc:1}  {Document your independent review of radiology images, and any outside records:1} {Document your discussion with family members, caretakers, and with consultants:1} {Document social determinants of health affecting pt's care:1} {Document your decision making why or why not admission, treatments were needed:1} Final Clinical Impression(s) / ED Diagnoses Final diagnoses:  None  Rx / DC Orders ED Discharge Orders     None

## 2022-10-26 NOTE — ED Notes (Signed)
PTAR, moving to yellow zone and discharge explained to patient's parents. Father of patient verbalized and agreed with plan. Mother of patient asked repeatedly for further explanation. PTAR, moving to yellow zone and discharge explained to mother several times and understanding verified with mother verbalizing understanding. Parents in room mat bedside with patient.

## 2022-10-26 NOTE — ED Triage Notes (Signed)
PT BIB EMS for pulling out her Gtube out.  Pt is bed bound at baseline.   122/92 HR 91 Resp 20 O2 96% RA, on humidified trach

## 2022-10-26 NOTE — Progress Notes (Signed)
Pharmacy Antibiotic Note  Jamie [name] is a 22 y.o. female admitted on 10/26/2022 with  intra-abdominal infection .  Pharmacy has been consulted for Cefepime dosing.  Plan: Start Cefepime 2g IV q8h Continue Metronidazole 500mg  IV q12h per MD Monitor daily CBC, temp, SCr, and for clinical signs of improvement  F/u cultures and de-escalate antibiotics as able     Temp (24hrs), Avg:100.3 F (37.9 C), Min:100.3 F (37.9 C), Max:100.3 F (37.9 C)  Recent Labs  Lab 10/26/22 1408  CREATININE 0.60    CrCl cannot be calculated (Unknown ideal weight.).    Allergies  Allergen Reactions   Rituximab Other (See Comments)    Fever / Tachycardia / Infusion reaction after dose administered on 03/03/2021.  Patient was premedicated.  Infusion eventually completed at a slow rate / multiple interruptions.    Antimicrobials this admission: Cefepime 7/2 >>  Metronidazole 7/2 >>   Dose adjustments this admission: N/A  Microbiology results: 7/2 BCx: sent 7/2 UCx: sent    Thank you for allowing pharmacy to be a part of this patient's care.  Wilburn Cornelia, PharmD, BCPS Clinical Pharmacist 10/26/2022 2:17 PM   Please refer to AMION for pharmacy phone number

## 2022-10-27 NOTE — ED Notes (Signed)
PTAR here at this time. Provided with handoff report and medical necessity form with face sheet and discharge paperwork with patient. Mother at bedside. No further needs.

## 2022-10-28 LAB — URINE CULTURE: Culture: NO GROWTH

## 2022-10-28 LAB — CULTURE, BLOOD (ROUTINE X 2)

## 2022-10-29 DIAGNOSIS — R532 Functional quadriplegia: Secondary | ICD-10-CM | POA: Diagnosis not present

## 2022-10-31 LAB — CULTURE, BLOOD (ROUTINE X 2)
Culture: NO GROWTH
Special Requests: ADEQUATE

## 2022-11-04 DIAGNOSIS — G0481 Other encephalitis and encephalomyelitis: Secondary | ICD-10-CM | POA: Diagnosis not present

## 2022-11-08 ENCOUNTER — Encounter (HOSPITAL_COMMUNITY): Payer: Self-pay

## 2022-11-08 ENCOUNTER — Other Ambulatory Visit: Payer: Self-pay

## 2022-11-08 ENCOUNTER — Emergency Department (HOSPITAL_COMMUNITY): Payer: Medicaid Other

## 2022-11-08 ENCOUNTER — Emergency Department (HOSPITAL_COMMUNITY)
Admission: EM | Admit: 2022-11-08 | Discharge: 2022-11-08 | Disposition: A | Discharge status: Home / Self Care | Payer: Medicaid Other | Attending: Emergency Medicine | Admitting: Emergency Medicine

## 2022-11-08 DIAGNOSIS — Z79899 Other long term (current) drug therapy: Secondary | ICD-10-CM | POA: Diagnosis not present

## 2022-11-08 DIAGNOSIS — R404 Transient alteration of awareness: Secondary | ICD-10-CM | POA: Diagnosis not present

## 2022-11-08 DIAGNOSIS — K9423 Gastrostomy malfunction: Secondary | ICD-10-CM | POA: Diagnosis not present

## 2022-11-08 DIAGNOSIS — T85528A Displacement of other gastrointestinal prosthetic devices, implants and grafts, initial encounter: Secondary | ICD-10-CM

## 2022-11-08 DIAGNOSIS — K9429 Other complications of gastrostomy: Secondary | ICD-10-CM | POA: Diagnosis not present

## 2022-11-08 DIAGNOSIS — R1110 Vomiting, unspecified: Secondary | ICD-10-CM | POA: Diagnosis not present

## 2022-11-08 HISTORY — PX: IR REPLACE G-TUBE SIMPLE WO FLUORO: IMG2323

## 2022-11-08 LAB — CBG MONITORING, ED: Glucose-Capillary: 140 mg/dL — ABNORMAL HIGH (ref 70–99)

## 2022-11-08 MED ORDER — IOHEXOL 300 MG/ML  SOLN
50.0000 mL | Freq: Once | INTRAMUSCULAR | Status: AC | PRN
  Administered 2022-11-08: 40 mL

## 2022-11-08 MED ORDER — ONDANSETRON HCL 4 MG/2ML IJ SOLN
4.0000 mg | Freq: Once | INTRAMUSCULAR | Status: AC
  Administered 2022-11-08: 4 mg via INTRAMUSCULAR
  Filled 2022-11-08: qty 2

## 2022-11-08 MED ORDER — LIDOCAINE VISCOUS HCL 2 % MT SOLN
OROMUCOSAL | Status: AC
  Filled 2022-11-08: qty 15

## 2022-11-08 MED ORDER — LIDOCAINE VISCOUS HCL 2 % MT SOLN: 15.0000 mL | Freq: Once | OROMUCOSAL | Status: DC

## 2022-11-08 NOTE — ED Provider Notes (Signed)
[place] EMERGENCY DEPARTMENT AT [facility] Provider Note   CSN: [id number] Arrival date & time: 11/08/22  1115     History  No chief complaint on file.   Jamie [name] is a 22 y.o. female.  22 year old female who presents after dislodging her feeding tube.  According to the parents, patient pulled her G-tube out a few hours ago.  Patient has had no complaints since then.      Home Medications Prior to Admission medications   Medication Sig Start Date End Date Taking? Authorizing Provider  acetaminophen (TYLENOL) 160 MG/5ML solution Place 20.3 mLs (650 mg total) into feeding tube every 4 (four) hours as needed for fever. 03/16/21   Steffanie Dunn, DO  Baclofen 5 MG TABS Take 5 mg by mouth 2 (two) times daily. 12/29/21   [provider]  ciprofloxacin (CILOXAN) 0.3 % ophthalmic solution Place 2 drops into both eyes 2 (two) times daily. 11/22/21   [provider]  enoxaparin (LOVENOX) 40 MG/0.4ML injection Inject 0.4 mLs (40 mg total) into the skin daily. 03/17/21   Steffanie Dunn, DO  folic acid (FOLVITE) 1 MG tablet Take 1 mg by mouth daily. 12/18/21   [provider]  gabapentin (NEURONTIN) 100 MG capsule Take 100 mg by mouth 3 (three) times daily. 12/18/21   [provider]  lactulose (CHRONULAC) 10 GM/15ML solution SMARTSIG:30 Milliliter(s) By Mouth Daily PRN 12/14/21   [provider]  levETIRacetam (KEPPRA) 1500 MG/100ML SOLN Inject 50 mLs (750 mg total) into the vein every 12 (twelve) hours. 01/05/22   Ghimire, Werner Lean, MD  levothyroxine (SYNTHROID) 100 MCG tablet Place 1 tablet (100 mcg total) into feeding tube daily at 6 (six) AM. 03/17/21   Chestine Spore, Virl Axe, DO  Mouthwashes (MOUTH RINSE) LIQD solution 15 mLs by Mouth Rinse route 2 (two) times daily. 03/16/21   Steffanie Dunn, DO  Nutritional Supplements (FEEDING SUPPLEMENT, PROSOURCE TF,) liquid Place 45 mLs into feeding tube 3 (three) times daily. Patient taking  differently: Place 237 mLs into feeding tube 5 (five) times daily. 03/16/21   Steffanie Dunn, DO  polyethylene glycol powder (GLYCOLAX/MIRALAX) 17 GM/SCOOP powder Place 17 g into feeding tube daily. 01/05/22   Ghimire, Werner Lean, MD  scopolamine (TRANSDERM-SCOP) 1 MG/3DAYS Place 1 patch (1.5 mg total) onto the skin every 3 (three) days. 03/18/21   Steffanie Dunn, DO  tenofovir (VIREAD) 300 MG tablet Place 1 tablet (300 mg total) into feeding tube daily. 03/17/21   Steffanie Dunn, DO  tobramycin-dexamethasone [facility]) ophthalmic solution Place 1 drop into both eyes every 6 (six) hours. 12/30/21   [provider]  Water For Irrigation, Sterile (FREE WATER) SOLN Place 200 mLs into feeding tube every 6 (six) hours. 01/05/22   Ghimire, Werner Lean, MD      Allergies    Rituximab    Review of Systems   Review of Systems  All other systems reviewed and are negative.   Physical Exam Updated Vital Signs BP 98/60 (BP Location: Left Arm)   Pulse 86   Temp 99 F (37.2 C) (Oral)   Resp 16   Ht 1.6 m (5\' 3" )   Wt 62 kg   LMP 10/31/2022 (Exact Date)   SpO2 100%   BMI 24.21 kg/m  Physical Exam Vitals and nursing note reviewed.  Constitutional:      General: She is not in acute distress.    Appearance: Normal appearance. She is well-developed. She is not toxic-appearing.  HENT:     Head: Normocephalic and atraumatic.  Eyes:     General: Lids are normal.     Conjunctiva/sclera: Conjunctivae normal.     Pupils: Pupils are equal, round, and reactive to light.  Neck:     Thyroid: No thyroid mass.     Trachea: No tracheal deviation.  Cardiovascular:     Rate and Rhythm: Normal rate and regular rhythm.     Heart sounds: Normal heart sounds. No murmur heard.    No gallop.  Pulmonary:     Effort: Pulmonary effort is normal. No respiratory distress.     Breath sounds: Normal breath sounds. No stridor. No decreased breath sounds, wheezing, rhonchi or rales.  Abdominal:     General: There  is no distension.     Palpations: Abdomen is soft.     Tenderness: There is no abdominal tenderness. There is no rebound.    Musculoskeletal:        General: No tenderness. Normal range of motion.     Cervical back: Normal range of motion and neck supple.  Skin:    General: Skin is warm and dry.     Findings: No abrasion or rash.  Neurological:     Mental Status: She is alert. Mental status is at baseline.     GCS: GCS eye subscore is 4. GCS verbal subscore is 5. GCS motor subscore is 6.     Cranial Nerves: Cranial nerves are intact and 2-12 are intact.  Psychiatric:        Attention and Perception: Attention normal.        Speech: Speech normal.        Behavior: Behavior normal.    ED Results / Procedures / Treatments   Labs (all labs ordered are listed, but only abnormal results are displayed) Labs Reviewed - No data to display  EKG None  Radiology No results found.  Procedures Procedures    Medications Ordered in ED Medications - No data to display  ED Course/ Medical Decision Making/ A&P                             Medical Decision Making  Attempted to replace patient's G-tube unsuccessfully.  Consulted IR and they will come and see the patient replace her tube        Final Clinical Impression(s) / ED Diagnoses Final diagnoses:  None    Rx / DC Orders ED Discharge Orders     None         Lorre Nick, MD 11/08/22 1429

## 2022-11-08 NOTE — ED Notes (Signed)
Pt had episode of diaphoresis and spitting up. Dr. Rodena Medin notified and at bedside. Pt vitals stable.

## 2022-11-08 NOTE — ED Notes (Signed)
 [facility] called for pt transport back home.

## 2022-11-08 NOTE — Discharge Instructions (Signed)
Return for any problem.  ?

## 2022-11-08 NOTE — ED Notes (Signed)
Care assumed. Pt is out of room and in IR at this time.

## 2022-11-08 NOTE — ED Provider Notes (Addendum)
I was asked to evaluate patient by nursing staff.  Patient was awaiting transport with family.  Family had used new feeding tube to give the patient approximately 250 mL of Jevity.  Shortly after completing this feed the patient is now retching and attempting to vomit.  Patient given IM Zofran.  Chest x-ray ordered.  Chest x-ray is without acute abnormality.  Patient is significant improved after Zofran.  Suspect that patient's feeding causes nausea and vomiting attempted.  Patient is much more comfortable reported after treatment.  Family understands desires dc home.   Wynetta Fines, MD 11/08/22 1751    Wynetta Fines, MD 11/08/22 [phone]

## 2022-11-08 NOTE — Procedures (Signed)
Successful replacement of gastrostomy tube with new 16 fr balloon retention Entuit G tube without immediate complications. 7 cc saline placed into balloon. EBL none. Abd film pend.

## 2022-11-08 NOTE — ED Triage Notes (Signed)
The pt was bib EMS. The pt removed G-tube. Mittens are kept on hers hands but unfortunately, she was getting changed and mittens were off. A&Ox1, she answers yes and no questions. Mom is very involved with pts care. Family all needs a ride home due to riding with EMS. On a ventilator humidified T/C but no oxygen. VS B/P 112/72, P 80, R 20, SPO2 98%, T 98.49F.

## 2022-11-09 ENCOUNTER — Other Ambulatory Visit (HOSPITAL_COMMUNITY): Payer: Self-pay | Admitting: Interventional Radiology

## 2022-11-09 DIAGNOSIS — G0490 Encephalitis and encephalomyelitis, unspecified: Secondary | ICD-10-CM

## 2022-11-14 DIAGNOSIS — G9340 Encephalopathy, unspecified: Secondary | ICD-10-CM | POA: Diagnosis not present

## 2022-11-14 DIAGNOSIS — Z930 Tracheostomy status: Secondary | ICD-10-CM | POA: Diagnosis not present

## 2022-11-25 DIAGNOSIS — Z930 Tracheostomy status: Secondary | ICD-10-CM | POA: Diagnosis not present

## 2022-11-25 DIAGNOSIS — Z7401 Bed confinement status: Secondary | ICD-10-CM | POA: Diagnosis not present

## 2022-11-25 DIAGNOSIS — R32 Unspecified urinary incontinence: Secondary | ICD-10-CM | POA: Diagnosis not present

## 2022-11-26 DIAGNOSIS — R159 Full incontinence of feces: Secondary | ICD-10-CM | POA: Diagnosis not present

## 2022-11-26 DIAGNOSIS — G0481 Other encephalitis and encephalomyelitis: Secondary | ICD-10-CM | POA: Diagnosis not present

## 2022-11-26 DIAGNOSIS — R32 Unspecified urinary incontinence: Secondary | ICD-10-CM | POA: Diagnosis not present

## 2022-11-29 DIAGNOSIS — R532 Functional quadriplegia: Secondary | ICD-10-CM | POA: Diagnosis not present

## 2022-12-02 DIAGNOSIS — G0481 Other encephalitis and encephalomyelitis: Secondary | ICD-10-CM | POA: Diagnosis not present

## 2022-12-02 DIAGNOSIS — H16122 Filamentary keratitis, left eye: Secondary | ICD-10-CM | POA: Diagnosis not present

## 2022-12-02 DIAGNOSIS — H16123 Filamentary keratitis, bilateral: Secondary | ICD-10-CM | POA: Diagnosis not present

## 2022-12-06 DIAGNOSIS — G9340 Encephalopathy, unspecified: Secondary | ICD-10-CM | POA: Diagnosis not present

## 2022-12-06 DIAGNOSIS — Z931 Gastrostomy status: Secondary | ICD-10-CM | POA: Diagnosis not present

## 2022-12-07 DIAGNOSIS — G9340 Encephalopathy, unspecified: Secondary | ICD-10-CM | POA: Diagnosis not present

## 2022-12-07 DIAGNOSIS — Z930 Tracheostomy status: Secondary | ICD-10-CM | POA: Diagnosis not present

## 2022-12-12 DIAGNOSIS — R532 Functional quadriplegia: Secondary | ICD-10-CM | POA: Diagnosis not present

## 2022-12-12 DIAGNOSIS — R4189 Other symptoms and signs involving cognitive functions and awareness: Secondary | ICD-10-CM | POA: Diagnosis not present

## 2022-12-12 DIAGNOSIS — R2689 Other abnormalities of gait and mobility: Secondary | ICD-10-CM | POA: Diagnosis not present

## 2022-12-12 DIAGNOSIS — Z993 Dependence on wheelchair: Secondary | ICD-10-CM | POA: Diagnosis not present

## 2022-12-12 DIAGNOSIS — G0481 Other encephalitis and encephalomyelitis: Secondary | ICD-10-CM | POA: Diagnosis not present

## 2022-12-12 DIAGNOSIS — Z7409 Other reduced mobility: Secondary | ICD-10-CM | POA: Diagnosis not present

## 2022-12-12 DIAGNOSIS — Z789 Other specified health status: Secondary | ICD-10-CM | POA: Diagnosis not present

## 2022-12-16 DIAGNOSIS — G0481 Other encephalitis and encephalomyelitis: Secondary | ICD-10-CM | POA: Diagnosis not present

## 2022-12-16 DIAGNOSIS — B181 Chronic viral hepatitis B without delta-agent: Secondary | ICD-10-CM | POA: Diagnosis not present

## 2022-12-16 DIAGNOSIS — E063 Autoimmune thyroiditis: Secondary | ICD-10-CM | POA: Diagnosis not present

## 2022-12-17 DIAGNOSIS — B181 Chronic viral hepatitis B without delta-agent: Secondary | ICD-10-CM | POA: Diagnosis not present

## 2022-12-17 DIAGNOSIS — G0481 Other encephalitis and encephalomyelitis: Secondary | ICD-10-CM | POA: Diagnosis not present

## 2022-12-17 DIAGNOSIS — Z930 Tracheostomy status: Secondary | ICD-10-CM | POA: Diagnosis not present

## 2022-12-17 DIAGNOSIS — G901 Familial dysautonomia [Riley-Day]: Secondary | ICD-10-CM | POA: Diagnosis not present

## 2022-12-17 DIAGNOSIS — G4089 Other seizures: Secondary | ICD-10-CM | POA: Diagnosis not present

## 2022-12-17 DIAGNOSIS — E063 Autoimmune thyroiditis: Secondary | ICD-10-CM | POA: Diagnosis not present

## 2022-12-17 DIAGNOSIS — Z931 Gastrostomy status: Secondary | ICD-10-CM | POA: Diagnosis not present

## 2022-12-18 ENCOUNTER — Emergency Department (HOSPITAL_COMMUNITY): Payer: Medicaid Other

## 2022-12-18 ENCOUNTER — Emergency Department (HOSPITAL_COMMUNITY)
Admission: EM | Admit: 2022-12-18 | Discharge: 2022-12-18 | Disposition: A | Discharge status: Home / Self Care | Payer: Medicaid Other | Attending: Emergency Medicine | Admitting: Emergency Medicine

## 2022-12-18 ENCOUNTER — Other Ambulatory Visit: Payer: Self-pay

## 2022-12-18 DIAGNOSIS — T85528A Displacement of other gastrointestinal prosthetic devices, implants and grafts, initial encounter: Secondary | ICD-10-CM | POA: Diagnosis present

## 2022-12-18 DIAGNOSIS — Y732 Prosthetic and other implants, materials and accessory gastroenterology and urology devices associated with adverse incidents: Secondary | ICD-10-CM | POA: Insufficient documentation

## 2022-12-18 DIAGNOSIS — Z79890 Hormone replacement therapy (postmenopausal): Secondary | ICD-10-CM | POA: Diagnosis not present

## 2022-12-18 DIAGNOSIS — T85598A Other mechanical complication of other gastrointestinal prosthetic devices, implants and grafts, initial encounter: Secondary | ICD-10-CM | POA: Diagnosis not present

## 2022-12-18 DIAGNOSIS — Z931 Gastrostomy status: Secondary | ICD-10-CM | POA: Diagnosis not present

## 2022-12-18 DIAGNOSIS — K9420 Gastrostomy complication, unspecified: Secondary | ICD-10-CM

## 2022-12-18 DIAGNOSIS — Z4682 Encounter for fitting and adjustment of non-vascular catheter: Secondary | ICD-10-CM | POA: Diagnosis not present

## 2022-12-18 DIAGNOSIS — K9423 Gastrostomy malfunction: Secondary | ICD-10-CM | POA: Diagnosis not present

## 2022-12-18 DIAGNOSIS — E039 Hypothyroidism, unspecified: Secondary | ICD-10-CM | POA: Insufficient documentation

## 2022-12-18 MED ORDER — IOHEXOL 300 MG/ML  SOLN
30.0000 mL | Freq: Once | INTRAMUSCULAR | Status: AC | PRN
  Administered 2022-12-18: 30 mL

## 2022-12-18 NOTE — ED Triage Notes (Signed)
Pt BIB by [facility] from home. Pt G tube dislodged

## 2022-12-18 NOTE — ED Provider Notes (Signed)
[place] EMERGENCY DEPARTMENT AT [facility] Provider Note   CSN: [id number] Arrival date & time: 12/18/22  2130     History  Chief Complaint  Patient presents with   G tube dislodgement      Jamie [name] is a 22 y.o. female.  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  Per family at bedside, they believe that she pulled out approximately 1 hour prior to arrival.  She was last given meds through it around 1 AM.  Patient is awake and alert.  She does not speak.  She does not appear in any respiratory distress.  Level 5 caveat   Home Medications Prior to Admission medications   Medication Sig Start Date End Date Taking? Authorizing Provider  acetaminophen (TYLENOL) 160 MG/5ML solution Place 20.3 mLs (650 mg total) into feeding tube every 4 (four) hours as needed for fever. 03/16/21   Steffanie Dunn, DO  Baclofen 5 MG TABS Take 5 mg by mouth 2 (two) times daily. 12/29/21   [provider]  ciprofloxacin (CILOXAN) 0.3 % ophthalmic solution Place 2 drops into both eyes 2 (two) times daily. 11/22/21   [provider]  enoxaparin (LOVENOX) 40 MG/0.4ML injection Inject 0.4 mLs (40 mg total) into the skin daily. 03/17/21   Steffanie Dunn, DO  folic acid (FOLVITE) 1 MG tablet Take 1 mg by mouth daily. 12/18/21   [provider]  gabapentin (NEURONTIN) 100 MG capsule Take 100 mg by mouth 3 (three) times daily. 12/18/21   [provider]  lactulose (CHRONULAC) 10 GM/15ML solution SMARTSIG:30 Milliliter(s) By Mouth Daily PRN 12/14/21   [provider]  levETIRacetam (KEPPRA) 1500 MG/100ML SOLN Inject 50 mLs (750 mg total) into the vein every 12 (twelve) hours. 01/05/22   Ghimire, Werner Lean, MD  levothyroxine (SYNTHROID) 100 MCG tablet Place 1 tablet (100 mcg total) into feeding tube daily at 6 (six) AM. 03/17/21   Chestine Spore,  Virl Axe, DO  Mouthwashes (MOUTH RINSE) LIQD solution 15 mLs by Mouth Rinse route 2 (two) times daily. 03/16/21   Steffanie Dunn, DO  Nutritional Supplements (FEEDING SUPPLEMENT, PROSOURCE TF,) liquid Place 45 mLs into feeding tube 3 (three) times daily. Patient taking differently: Place 237 mLs into feeding tube 5 (five) times daily. 03/16/21   Steffanie Dunn, DO  polyethylene glycol powder (GLYCOLAX/MIRALAX) 17 GM/SCOOP powder Place 17 g into feeding tube daily. 01/05/22   Ghimire, Werner Lean, MD  scopolamine (TRANSDERM-SCOP) 1 MG/3DAYS Place 1 patch (1.5 mg total) onto the skin every 3 (three) days. 03/18/21   Steffanie Dunn, DO  tenofovir (VIREAD) 300 MG tablet Place 1 tablet (300 mg total) into feeding tube daily. 03/17/21   Steffanie Dunn, DO  tobramycin-dexamethasone [facility]) ophthalmic solution Place 1 drop into both eyes every 6 (six) hours. 12/30/21   [provider]  Water For Irrigation, Sterile (FREE WATER) SOLN Place 200 mLs into feeding tube every 6 (six) hours. 01/05/22   Ghimire, Werner Lean, MD      Allergies    Rituximab    Review of Systems   Review of Systems  Unable to perform ROS: Patient nonverbal  All other systems reviewed and are negative.   Physical Exam Updated Vital Signs BP 95/66   Pulse 95   Temp 98.3 F (36.8 C) (Oral)   Resp 16   SpO2 100%  Physical Exam Vitals and nursing note reviewed.  Constitutional:      Appearance: She is well-developed. She is not ill-appearing.  HENT:     Head: Normocephalic and atraumatic.  Eyes:     Pupils: Pupils are equal, round, and reactive to light.  Neck:     Comments: Trach in place Cardiovascular:     Rate and Rhythm: Normal rate and regular rhythm.  Pulmonary:     Effort: Pulmonary effort is normal. No respiratory distress.  Abdominal:     Palpations: Abdomen is soft.     Tenderness: There is no abdominal tenderness.     Comments: G-tube site clean and dry  Musculoskeletal:     Cervical back: Neck  supple.  Skin:    General: Skin is warm and dry.  Neurological:     Mental Status: She is alert and oriented to person, place, and time.  Psychiatric:        Mood and Affect: Mood normal.     ED Results / Procedures / Treatments   Labs (all labs ordered are listed, but only abnormal results are displayed) Labs Reviewed - No data to display  EKG None  Radiology No results found.  Procedures Gastrostomy tube replacement  Date/Time: 12/18/2022 6:20 AM  Performed by: Shon Baton, MD Authorized by: Shon Baton, MD  Consent: The procedure was performed in an emergent situation. Verbal consent not obtained. Risks and benefits: risks, benefits and alternatives were discussed Consent given by: guardian Local anesthesia used: no  Anesthesia: Local anesthesia used: no  Sedation: Patient sedated: no  Patient tolerance: patient tolerated the procedure well with no immediate complications Comments: Initially unable to pass a 16 [place] tube.  Was able to pass a 14 French red rubber Foley catheter with some mild resistance; however, appeared to be in place.  Ultimately used a guidewire to establish the track and attempt 16 French tube replacement.  With some gentle pressure, able to replace tube.  Patient seemed to tolerate this well.  G-tube study was ordered given mild resistance during placement.       Medications Ordered in ED Medications - No data to display  ED Course/ Medical Decision Making/ A&P                                 Medical Decision Making Amount and/or Complexity of Data Reviewed Radiology: ordered.  Risk Prescription drug management.   This patient presents to the ED for concern of G-tube complication, this involves an extensive number of treatment options, and is a complaint that carries with it a high risk of complications and morbidity.  I considered the following differential and admission for this acute, potentially life threatening  condition.  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  Initial difficulty replacing 16 French tube.  However, I was able to place a guidewire and ultimately replaced a 16 [place] G-tube with minimal resistance.  G-tube study confirms placement without malpositioning.  Tube flushes easily.  Patient discharged back to family care.  (Labs, imaging, consults)  Labs: I Ordered, and personally interpreted labs.  The pertinent results include: None  Imaging Studies ordered: I ordered imaging studies including G-tube study I independently visualized and interpreted imaging. I agree with the radiologist  interpretation  Additional history obtained from chart review.  External records from outside source obtained and reviewed including prior evaluations  Cardiac Monitoring: The patient was maintained on a cardiac monitor.  If on the cardiac monitor, I personally viewed and interpreted the cardiac monitored which showed an underlying rhythm of: sinus rhythm  Reevaluation: After the interventions noted above, I reevaluated the patient and found that they have :stayed the same  Social Determinants of Health:  disabled  Disposition: Discharge  Co morbidities that complicate the patient evaluation  Past Medical History:  Diagnosis Date   Acquired autoimmune hypothyroidism    Dx 12/2014, TSH 110, FT4 0.3     Medicines Meds ordered this encounter  Medications   iohexol (OMNIPAQUE) 300 MG/ML solution 30 mL    I have reviewed the patients home medicines and have made adjustments as needed  Problem List / ED Course: Problem List Items Addressed This Visit   None Visit Diagnoses     Complication of gastrostomy tube (HCC)    -  Primary                   Final Clinical  Impression(s) / ED Diagnoses Final diagnoses:  None    Rx / DC Orders ED Discharge Orders     None         Shon Baton, MD 12/18/22 [phone]

## 2022-12-18 NOTE — ED Notes (Signed)
Guidewire from [facility] obtained for G-Tube placement.

## 2022-12-18 NOTE — ED Notes (Signed)
[facility] transport setup for pt 

## 2022-12-27 DIAGNOSIS — G40909 Epilepsy, unspecified, not intractable, without status epilepticus: Secondary | ICD-10-CM | POA: Diagnosis not present

## 2022-12-27 DIAGNOSIS — Z7401 Bed confinement status: Secondary | ICD-10-CM | POA: Diagnosis not present

## 2022-12-27 DIAGNOSIS — G0490 Encephalitis and encephalomyelitis, unspecified: Secondary | ICD-10-CM | POA: Diagnosis not present

## 2022-12-27 DIAGNOSIS — E063 Autoimmune thyroiditis: Secondary | ICD-10-CM | POA: Diagnosis not present

## 2022-12-27 DIAGNOSIS — Z931 Gastrostomy status: Secondary | ICD-10-CM | POA: Diagnosis not present

## 2022-12-29 DIAGNOSIS — Z931 Gastrostomy status: Secondary | ICD-10-CM | POA: Diagnosis not present

## 2022-12-30 DIAGNOSIS — R532 Functional quadriplegia: Secondary | ICD-10-CM | POA: Diagnosis not present

## 2023-01-03 DIAGNOSIS — Z930 Tracheostomy status: Secondary | ICD-10-CM | POA: Diagnosis not present

## 2023-01-06 DIAGNOSIS — Z931 Gastrostomy status: Secondary | ICD-10-CM | POA: Diagnosis not present

## 2023-01-09 DIAGNOSIS — R32 Unspecified urinary incontinence: Secondary | ICD-10-CM | POA: Diagnosis not present

## 2023-01-09 DIAGNOSIS — G0481 Other encephalitis and encephalomyelitis: Secondary | ICD-10-CM | POA: Diagnosis not present

## 2023-01-09 DIAGNOSIS — R159 Full incontinence of feces: Secondary | ICD-10-CM | POA: Diagnosis not present

## 2023-01-27 DIAGNOSIS — K117 Disturbances of salivary secretion: Secondary | ICD-10-CM | POA: Diagnosis not present

## 2023-01-27 DIAGNOSIS — G8220 Paraplegia, unspecified: Secondary | ICD-10-CM | POA: Diagnosis not present

## 2023-01-27 DIAGNOSIS — Z7189 Other specified counseling: Secondary | ICD-10-CM | POA: Diagnosis not present

## 2023-01-27 DIAGNOSIS — Z930 Tracheostomy status: Secondary | ICD-10-CM | POA: Diagnosis not present

## 2023-01-27 DIAGNOSIS — Z7401 Bed confinement status: Secondary | ICD-10-CM | POA: Diagnosis not present

## 2023-01-27 DIAGNOSIS — G0490 Encephalitis and encephalomyelitis, unspecified: Secondary | ICD-10-CM | POA: Diagnosis not present

## 2023-01-27 DIAGNOSIS — L299 Pruritus, unspecified: Secondary | ICD-10-CM | POA: Diagnosis not present

## 2023-01-29 DIAGNOSIS — R532 Functional quadriplegia: Secondary | ICD-10-CM | POA: Diagnosis not present

## 2023-01-31 DIAGNOSIS — Z930 Tracheostomy status: Secondary | ICD-10-CM | POA: Diagnosis not present

## 2023-01-31 DIAGNOSIS — G9341 Metabolic encephalopathy: Secondary | ICD-10-CM | POA: Diagnosis not present

## 2023-01-31 DIAGNOSIS — G0481 Other encephalitis and encephalomyelitis: Secondary | ICD-10-CM | POA: Diagnosis not present

## 2023-01-31 DIAGNOSIS — G8220 Paraplegia, unspecified: Secondary | ICD-10-CM | POA: Diagnosis not present

## 2023-01-31 DIAGNOSIS — Z931 Gastrostomy status: Secondary | ICD-10-CM | POA: Diagnosis not present

## 2023-01-31 DIAGNOSIS — G901 Familial dysautonomia [Riley-Day]: Secondary | ICD-10-CM | POA: Diagnosis not present

## 2023-01-31 DIAGNOSIS — R6889 Other general symptoms and signs: Secondary | ICD-10-CM | POA: Diagnosis not present

## 2023-02-09 DIAGNOSIS — R159 Full incontinence of feces: Secondary | ICD-10-CM | POA: Diagnosis not present

## 2023-02-09 DIAGNOSIS — R32 Unspecified urinary incontinence: Secondary | ICD-10-CM | POA: Diagnosis not present

## 2023-02-09 DIAGNOSIS — G0481 Other encephalitis and encephalomyelitis: Secondary | ICD-10-CM | POA: Diagnosis not present

## 2023-02-10 DIAGNOSIS — Z931 Gastrostomy status: Secondary | ICD-10-CM | POA: Diagnosis not present

## 2023-02-10 DIAGNOSIS — G0481 Other encephalitis and encephalomyelitis: Secondary | ICD-10-CM | POA: Diagnosis not present

## 2023-02-10 DIAGNOSIS — Z79899 Other long term (current) drug therapy: Secondary | ICD-10-CM | POA: Diagnosis not present

## 2023-03-01 DIAGNOSIS — R532 Functional quadriplegia: Secondary | ICD-10-CM | POA: Diagnosis not present

## 2023-03-02 DIAGNOSIS — G40909 Epilepsy, unspecified, not intractable, without status epilepticus: Secondary | ICD-10-CM | POA: Diagnosis not present

## 2023-03-02 DIAGNOSIS — Z931 Gastrostomy status: Secondary | ICD-10-CM | POA: Diagnosis not present

## 2023-03-02 DIAGNOSIS — Z7401 Bed confinement status: Secondary | ICD-10-CM | POA: Diagnosis not present

## 2023-03-02 DIAGNOSIS — Z760 Encounter for issue of repeat prescription: Secondary | ICD-10-CM | POA: Diagnosis not present

## 2023-03-02 DIAGNOSIS — Z930 Tracheostomy status: Secondary | ICD-10-CM | POA: Diagnosis not present

## 2023-03-06 DIAGNOSIS — E559 Vitamin D deficiency, unspecified: Secondary | ICD-10-CM | POA: Diagnosis not present

## 2023-03-06 DIAGNOSIS — R799 Abnormal finding of blood chemistry, unspecified: Secondary | ICD-10-CM | POA: Diagnosis not present

## 2023-03-06 DIAGNOSIS — R946 Abnormal results of thyroid function studies: Secondary | ICD-10-CM | POA: Diagnosis not present

## 2023-03-06 DIAGNOSIS — Z931 Gastrostomy status: Secondary | ICD-10-CM | POA: Diagnosis not present

## 2023-03-06 DIAGNOSIS — E063 Autoimmune thyroiditis: Secondary | ICD-10-CM | POA: Diagnosis not present

## 2023-03-06 DIAGNOSIS — E785 Hyperlipidemia, unspecified: Secondary | ICD-10-CM | POA: Diagnosis not present

## 2023-03-06 DIAGNOSIS — G0490 Encephalitis and encephalomyelitis, unspecified: Secondary | ICD-10-CM | POA: Diagnosis not present

## 2023-03-06 DIAGNOSIS — R7309 Other abnormal glucose: Secondary | ICD-10-CM | POA: Diagnosis not present

## 2023-03-07 DIAGNOSIS — Z931 Gastrostomy status: Secondary | ICD-10-CM | POA: Diagnosis not present

## 2023-03-07 DIAGNOSIS — G9341 Metabolic encephalopathy: Secondary | ICD-10-CM | POA: Diagnosis not present

## 2023-03-10 ENCOUNTER — Inpatient Hospital Stay (HOSPITAL_COMMUNITY): Admission: RE | Admit: 2023-03-10 | Payer: Medicaid Other | Source: Ambulatory Visit

## 2023-03-12 DIAGNOSIS — R159 Full incontinence of feces: Secondary | ICD-10-CM | POA: Diagnosis not present

## 2023-03-12 DIAGNOSIS — G0481 Other encephalitis and encephalomyelitis: Secondary | ICD-10-CM | POA: Diagnosis not present

## 2023-03-12 DIAGNOSIS — R32 Unspecified urinary incontinence: Secondary | ICD-10-CM | POA: Diagnosis not present

## 2023-03-30 DIAGNOSIS — G9340 Encephalopathy, unspecified: Secondary | ICD-10-CM | POA: Diagnosis not present

## 2023-03-30 DIAGNOSIS — Z930 Tracheostomy status: Secondary | ICD-10-CM | POA: Diagnosis not present

## 2023-03-31 DIAGNOSIS — R532 Functional quadriplegia: Secondary | ICD-10-CM | POA: Diagnosis not present

## 2023-04-06 DIAGNOSIS — G0490 Encephalitis and encephalomyelitis, unspecified: Secondary | ICD-10-CM | POA: Diagnosis not present

## 2023-04-06 DIAGNOSIS — E559 Vitamin D deficiency, unspecified: Secondary | ICD-10-CM | POA: Diagnosis not present

## 2023-04-06 DIAGNOSIS — Z7401 Bed confinement status: Secondary | ICD-10-CM | POA: Diagnosis not present

## 2023-04-06 DIAGNOSIS — E039 Hypothyroidism, unspecified: Secondary | ICD-10-CM | POA: Diagnosis not present

## 2023-04-06 DIAGNOSIS — Z0000 Encounter for general adult medical examination without abnormal findings: Secondary | ICD-10-CM | POA: Diagnosis not present

## 2023-04-06 DIAGNOSIS — Z931 Gastrostomy status: Secondary | ICD-10-CM | POA: Diagnosis not present

## 2023-04-06 DIAGNOSIS — K117 Disturbances of salivary secretion: Secondary | ICD-10-CM | POA: Diagnosis not present

## 2023-04-11 DIAGNOSIS — Z931 Gastrostomy status: Secondary | ICD-10-CM | POA: Diagnosis not present

## 2023-04-11 DIAGNOSIS — G9341 Metabolic encephalopathy: Secondary | ICD-10-CM | POA: Diagnosis not present

## 2023-04-12 DIAGNOSIS — R159 Full incontinence of feces: Secondary | ICD-10-CM | POA: Diagnosis not present

## 2023-04-12 DIAGNOSIS — G0481 Other encephalitis and encephalomyelitis: Secondary | ICD-10-CM | POA: Diagnosis not present

## 2023-04-12 DIAGNOSIS — R32 Unspecified urinary incontinence: Secondary | ICD-10-CM | POA: Diagnosis not present

## 2023-05-01 DIAGNOSIS — R532 Functional quadriplegia: Secondary | ICD-10-CM | POA: Diagnosis not present

## 2023-05-11 DIAGNOSIS — Z931 Gastrostomy status: Secondary | ICD-10-CM | POA: Diagnosis not present

## 2023-05-11 DIAGNOSIS — G9341 Metabolic encephalopathy: Secondary | ICD-10-CM | POA: Diagnosis not present

## 2023-05-12 DIAGNOSIS — G0481 Other encephalitis and encephalomyelitis: Secondary | ICD-10-CM | POA: Diagnosis not present

## 2023-05-12 DIAGNOSIS — Z931 Gastrostomy status: Secondary | ICD-10-CM | POA: Diagnosis not present

## 2023-05-12 DIAGNOSIS — G9341 Metabolic encephalopathy: Secondary | ICD-10-CM | POA: Diagnosis not present

## 2023-05-12 DIAGNOSIS — Z930 Tracheostomy status: Secondary | ICD-10-CM | POA: Diagnosis not present

## 2023-05-13 DIAGNOSIS — R32 Unspecified urinary incontinence: Secondary | ICD-10-CM | POA: Diagnosis not present

## 2023-05-13 DIAGNOSIS — R159 Full incontinence of feces: Secondary | ICD-10-CM | POA: Diagnosis not present

## 2023-05-13 DIAGNOSIS — G0481 Other encephalitis and encephalomyelitis: Secondary | ICD-10-CM | POA: Diagnosis not present

## 2023-05-15 DIAGNOSIS — G0481 Other encephalitis and encephalomyelitis: Secondary | ICD-10-CM | POA: Diagnosis not present

## 2023-05-15 DIAGNOSIS — R532 Functional quadriplegia: Secondary | ICD-10-CM | POA: Diagnosis not present

## 2023-05-16 DIAGNOSIS — G8220 Paraplegia, unspecified: Secondary | ICD-10-CM | POA: Diagnosis not present

## 2023-05-16 DIAGNOSIS — Z431 Encounter for attention to gastrostomy: Secondary | ICD-10-CM | POA: Diagnosis not present

## 2023-05-16 DIAGNOSIS — G901 Familial dysautonomia [Riley-Day]: Secondary | ICD-10-CM | POA: Diagnosis not present

## 2023-05-16 DIAGNOSIS — G40909 Epilepsy, unspecified, not intractable, without status epilepticus: Secondary | ICD-10-CM | POA: Diagnosis not present

## 2023-05-16 DIAGNOSIS — K759 Inflammatory liver disease, unspecified: Secondary | ICD-10-CM | POA: Diagnosis not present

## 2023-05-16 DIAGNOSIS — E063 Autoimmune thyroiditis: Secondary | ICD-10-CM | POA: Diagnosis not present

## 2023-05-17 DIAGNOSIS — E039 Hypothyroidism, unspecified: Secondary | ICD-10-CM | POA: Diagnosis not present

## 2023-05-18 DIAGNOSIS — G8220 Paraplegia, unspecified: Secondary | ICD-10-CM | POA: Diagnosis not present

## 2023-05-18 DIAGNOSIS — K759 Inflammatory liver disease, unspecified: Secondary | ICD-10-CM | POA: Diagnosis not present

## 2023-05-18 DIAGNOSIS — Z431 Encounter for attention to gastrostomy: Secondary | ICD-10-CM | POA: Diagnosis not present

## 2023-05-18 DIAGNOSIS — E063 Autoimmune thyroiditis: Secondary | ICD-10-CM | POA: Diagnosis not present

## 2023-05-18 DIAGNOSIS — G40909 Epilepsy, unspecified, not intractable, without status epilepticus: Secondary | ICD-10-CM | POA: Diagnosis not present

## 2023-05-18 DIAGNOSIS — G901 Familial dysautonomia [Riley-Day]: Secondary | ICD-10-CM | POA: Diagnosis not present

## 2023-05-19 DIAGNOSIS — G40909 Epilepsy, unspecified, not intractable, without status epilepticus: Secondary | ICD-10-CM | POA: Diagnosis not present

## 2023-05-19 DIAGNOSIS — G8220 Paraplegia, unspecified: Secondary | ICD-10-CM | POA: Diagnosis not present

## 2023-05-19 DIAGNOSIS — E063 Autoimmune thyroiditis: Secondary | ICD-10-CM | POA: Diagnosis not present

## 2023-05-19 DIAGNOSIS — Z431 Encounter for attention to gastrostomy: Secondary | ICD-10-CM | POA: Diagnosis not present

## 2023-05-19 DIAGNOSIS — G901 Familial dysautonomia [Riley-Day]: Secondary | ICD-10-CM | POA: Diagnosis not present

## 2023-05-19 DIAGNOSIS — K759 Inflammatory liver disease, unspecified: Secondary | ICD-10-CM | POA: Diagnosis not present

## 2023-05-23 DIAGNOSIS — E063 Autoimmune thyroiditis: Secondary | ICD-10-CM | POA: Diagnosis not present

## 2023-05-23 DIAGNOSIS — G901 Familial dysautonomia [Riley-Day]: Secondary | ICD-10-CM | POA: Diagnosis not present

## 2023-05-23 DIAGNOSIS — G8220 Paraplegia, unspecified: Secondary | ICD-10-CM | POA: Diagnosis not present

## 2023-05-23 DIAGNOSIS — K759 Inflammatory liver disease, unspecified: Secondary | ICD-10-CM | POA: Diagnosis not present

## 2023-05-23 DIAGNOSIS — G40909 Epilepsy, unspecified, not intractable, without status epilepticus: Secondary | ICD-10-CM | POA: Diagnosis not present

## 2023-05-23 DIAGNOSIS — Z431 Encounter for attention to gastrostomy: Secondary | ICD-10-CM | POA: Diagnosis not present

## 2023-05-24 DIAGNOSIS — G40909 Epilepsy, unspecified, not intractable, without status epilepticus: Secondary | ICD-10-CM | POA: Diagnosis not present

## 2023-05-24 DIAGNOSIS — Z431 Encounter for attention to gastrostomy: Secondary | ICD-10-CM | POA: Diagnosis not present

## 2023-05-24 DIAGNOSIS — G8220 Paraplegia, unspecified: Secondary | ICD-10-CM | POA: Diagnosis not present

## 2023-05-24 DIAGNOSIS — K759 Inflammatory liver disease, unspecified: Secondary | ICD-10-CM | POA: Diagnosis not present

## 2023-05-24 DIAGNOSIS — G901 Familial dysautonomia [Riley-Day]: Secondary | ICD-10-CM | POA: Diagnosis not present

## 2023-05-24 DIAGNOSIS — E063 Autoimmune thyroiditis: Secondary | ICD-10-CM | POA: Diagnosis not present

## 2023-05-26 DIAGNOSIS — G8220 Paraplegia, unspecified: Secondary | ICD-10-CM | POA: Diagnosis not present

## 2023-05-26 DIAGNOSIS — K759 Inflammatory liver disease, unspecified: Secondary | ICD-10-CM | POA: Diagnosis not present

## 2023-05-26 DIAGNOSIS — E063 Autoimmune thyroiditis: Secondary | ICD-10-CM | POA: Diagnosis not present

## 2023-05-26 DIAGNOSIS — G901 Familial dysautonomia [Riley-Day]: Secondary | ICD-10-CM | POA: Diagnosis not present

## 2023-05-26 DIAGNOSIS — Z431 Encounter for attention to gastrostomy: Secondary | ICD-10-CM | POA: Diagnosis not present

## 2023-05-26 DIAGNOSIS — G40909 Epilepsy, unspecified, not intractable, without status epilepticus: Secondary | ICD-10-CM | POA: Diagnosis not present

## 2023-06-12 DIAGNOSIS — Z931 Gastrostomy status: Secondary | ICD-10-CM | POA: Diagnosis not present

## 2023-06-12 DIAGNOSIS — R6889 Other general symptoms and signs: Secondary | ICD-10-CM | POA: Diagnosis not present

## 2023-06-12 DIAGNOSIS — G0481 Other encephalitis and encephalomyelitis: Secondary | ICD-10-CM | POA: Diagnosis not present

## 2023-06-12 DIAGNOSIS — Z930 Tracheostomy status: Secondary | ICD-10-CM | POA: Diagnosis not present

## 2023-06-12 DIAGNOSIS — G9341 Metabolic encephalopathy: Secondary | ICD-10-CM | POA: Diagnosis not present

## 2023-06-13 DIAGNOSIS — G0481 Other encephalitis and encephalomyelitis: Secondary | ICD-10-CM | POA: Diagnosis not present

## 2023-06-13 DIAGNOSIS — R159 Full incontinence of feces: Secondary | ICD-10-CM | POA: Diagnosis not present

## 2023-06-13 DIAGNOSIS — R32 Unspecified urinary incontinence: Secondary | ICD-10-CM | POA: Diagnosis not present

## 2023-06-15 DIAGNOSIS — Z930 Tracheostomy status: Secondary | ICD-10-CM | POA: Diagnosis not present

## 2023-06-15 DIAGNOSIS — G40909 Epilepsy, unspecified, not intractable, without status epilepticus: Secondary | ICD-10-CM | POA: Diagnosis not present

## 2023-06-15 DIAGNOSIS — K117 Disturbances of salivary secretion: Secondary | ICD-10-CM | POA: Diagnosis not present

## 2023-06-15 DIAGNOSIS — G0481 Other encephalitis and encephalomyelitis: Secondary | ICD-10-CM | POA: Diagnosis not present

## 2023-06-15 DIAGNOSIS — Z760 Encounter for issue of repeat prescription: Secondary | ICD-10-CM | POA: Diagnosis not present

## 2023-06-15 DIAGNOSIS — Z931 Gastrostomy status: Secondary | ICD-10-CM | POA: Diagnosis not present

## 2023-06-28 DIAGNOSIS — Z79890 Hormone replacement therapy (postmenopausal): Secondary | ICD-10-CM | POA: Diagnosis not present

## 2023-06-28 DIAGNOSIS — J399 Disease of upper respiratory tract, unspecified: Secondary | ICD-10-CM | POA: Diagnosis not present

## 2023-06-28 DIAGNOSIS — Z930 Tracheostomy status: Secondary | ICD-10-CM | POA: Diagnosis not present

## 2023-06-29 DIAGNOSIS — Z930 Tracheostomy status: Secondary | ICD-10-CM | POA: Diagnosis not present

## 2023-06-29 DIAGNOSIS — J399 Disease of upper respiratory tract, unspecified: Secondary | ICD-10-CM | POA: Diagnosis not present

## 2023-06-29 DIAGNOSIS — Z79890 Hormone replacement therapy (postmenopausal): Secondary | ICD-10-CM | POA: Diagnosis not present

## 2023-07-14 DIAGNOSIS — G0481 Other encephalitis and encephalomyelitis: Secondary | ICD-10-CM | POA: Diagnosis not present

## 2023-07-14 DIAGNOSIS — R159 Full incontinence of feces: Secondary | ICD-10-CM | POA: Diagnosis not present

## 2023-07-14 DIAGNOSIS — R32 Unspecified urinary incontinence: Secondary | ICD-10-CM | POA: Diagnosis not present

## 2023-07-30 IMAGING — MR MR HEAD WO/W CM
14 of 20 series · 33 of 48 positions shown · IV contrast (gadavist)
Comparison: 02/09/2021

CLINICAL DATA: Mental status change, CNS infection suspected

EXAM:
MRI HEAD WITHOUT AND WITH CONTRAST
TECHNIQUE: Multiplanar, multiecho pulse sequences of the brain and surrounding
structures were obtained without and with intravenous contrast.
CONTRAST:  6mL GADAVIST GADOBUTROL 1 MMOL/ML IV SOLN

[Series 5: DWI · axial · 3.0mm · 0.88mm/px · z∈[-154,-21]mm · 5 of 100 slices shown (1 of 4)]
[im 1/100]
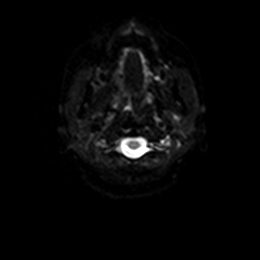
[im 25/100]
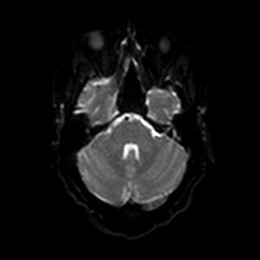
[im 50/100]
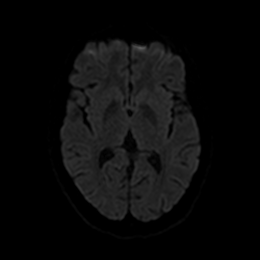
[im 75/100]
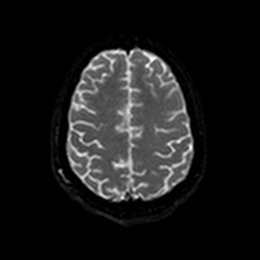
[im 100/100]
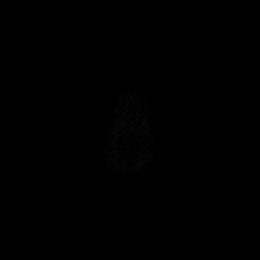

[Series 6: DWI · axial · 3.0mm · 0.88mm/px · z∈[-154,-21]mm · 2 of 50 slices shown (2 of 4)]
[im 1/50]
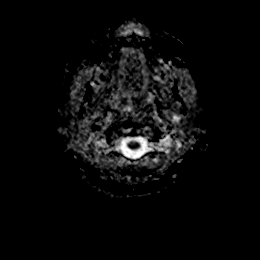
[im 50/50]
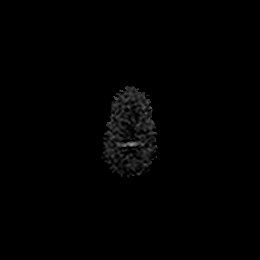

[Series 7: DWI · coronal · 4.0mm · 0.88mm/px · 3 of 70 slices shown (3 of 4)]
[im 1/70]
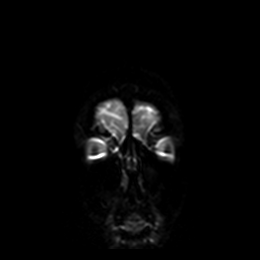
[im 35/70]
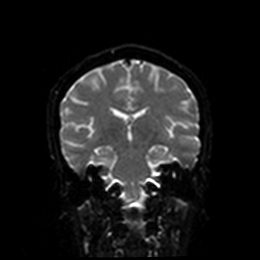
[im 70/70]
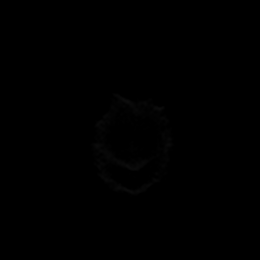

[Series 8: DWI · coronal · 4.0mm · 0.88mm/px · 1 of 35 slices shown (4 of 4)]
[im 1/35]
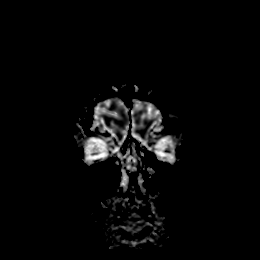

[Series 9: T1 · sagittal · 5.0mm · 0.75mm/px · 1 of 24 slices shown]
[im 1/24]
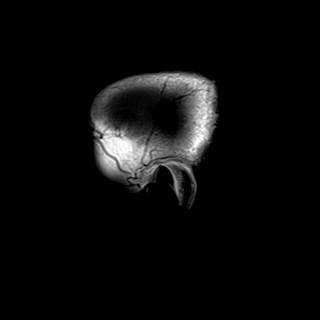

[Series 10: T2 · axial · 5.0mm · 0.72mm/px · z∈[-166,-20]mm · 2 of 28 slices shown]
[im 1/28]
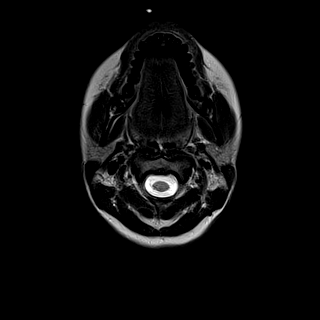
[im 28/28]
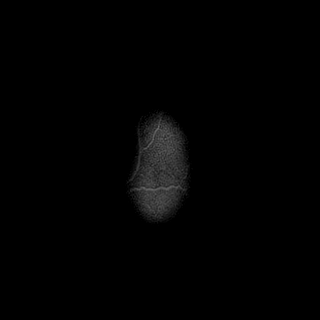

[Series 11: FLAIR · axial · 5.0mm · 0.45mm/px · z∈[-165,-18]mm · 2 of 28 slices shown]
[im 1/28]
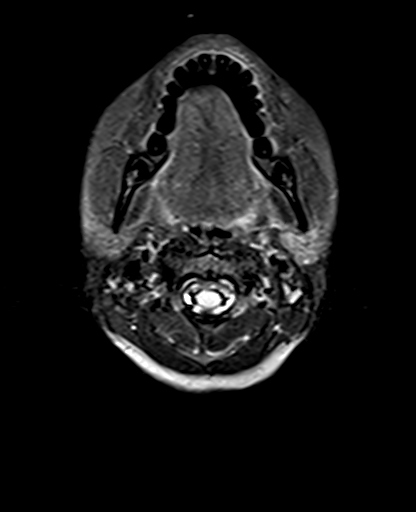
[im 28/28]
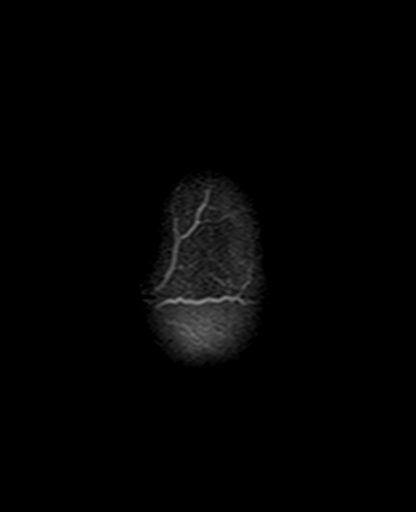

[Series 12: mag_images · axial · 3.0mm · 0.90mm/px · z∈[-157,-19]mm · 3 of 52 slices shown]
[im 1/52]
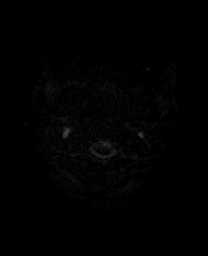
[im 26/52]
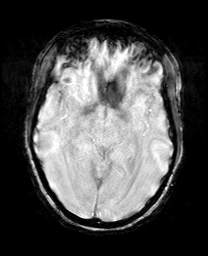
[im 52/52]
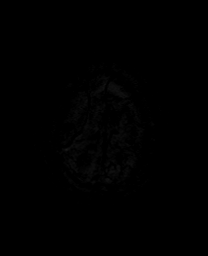

[Series 13: pha_images · axial · 3.0mm · 0.90mm/px · z∈[-154,-19]mm · 3 of 51 slices shown]
[im 1/51]
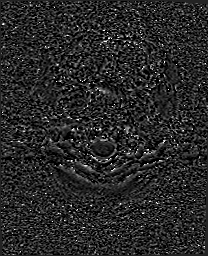
[im 26/51]
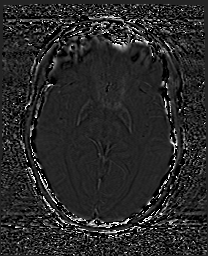
[im 51/51]
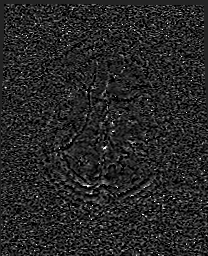

[Series 14: swi_images · axial · 3.0mm · 0.90mm/px · z∈[-157,-19]mm · 3 of 52 slices shown]
[im 1/52]
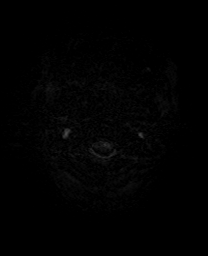
[im 26/52]
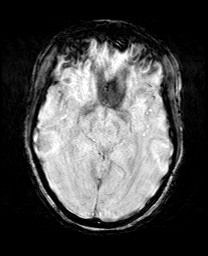
[im 52/52]
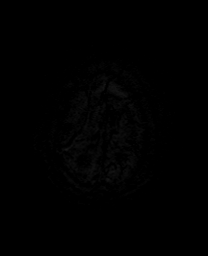

[Series 15: mip_images(sw) · axial · 24.0mm · 0.90mm/px · z∈[-148,-28]mm · 3 of 45 slices shown]
[im 1/45]
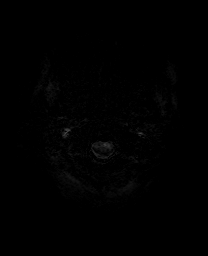
[im 23/45]
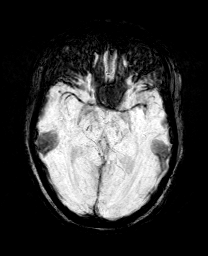
[im 45/45]
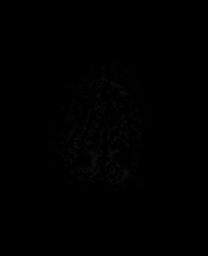

[Series 17: T2 post-contrast · coronal · 5.0mm · 0.72mm/px · 2 of 29 slices shown]
[im 1/29]
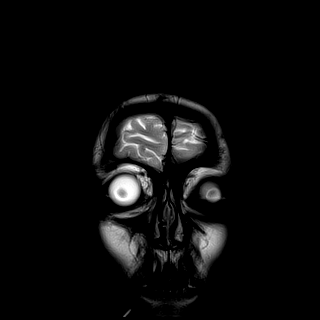
[im 29/29]
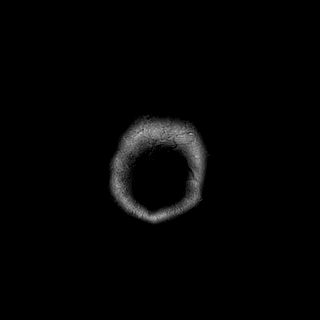

[Series 19: T1 post-contrast · coronal · 5.0mm · 0.34mm/px · 2 of 29 slices shown (1 of 2)]
[im 1/29]
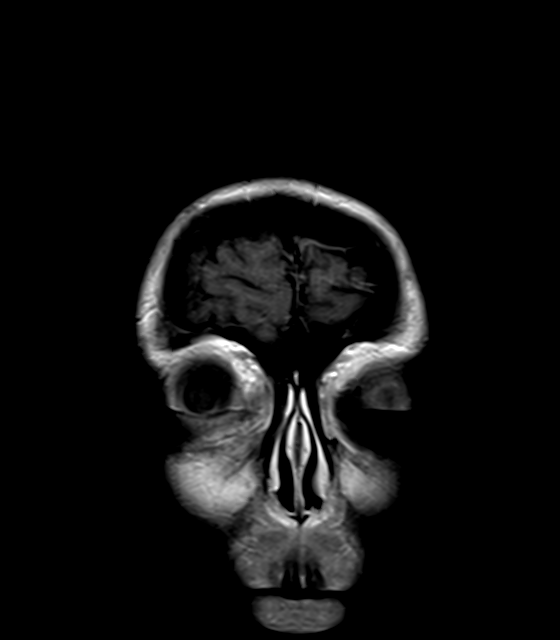
[im 29/29]
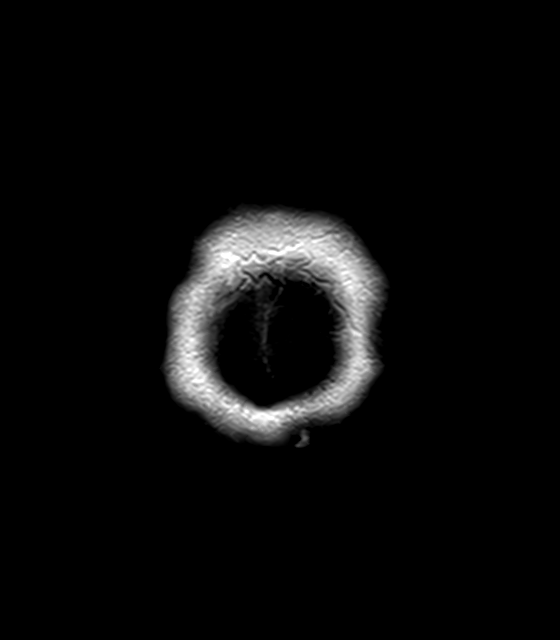

[Series 20: T1 post-contrast · sagittal · 5.0mm · 0.72mm/px · 1 of 24 slices shown (2 of 2)]
[im 1/24]
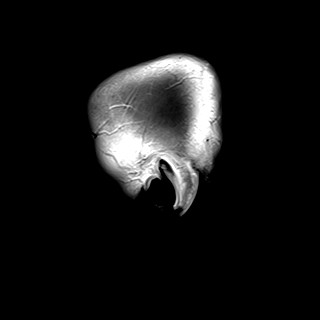

[33 of 48 positions shown; findings below may reference images not displayed]

FINDINGS: Brain: No acute infarction, hemorrhage, hydrocephalus, extra-axial
collection, or mass lesion. The ventricles and sulci are within
normal limits for age. No abnormal enhancement. No foci of
hemosiderin deposition to suggest remote hemorrhage.

Vascular: Normal flow voids.

Skull and upper cervical spine: Normal marrow signal.

Sinuses/Orbits: Negative.

Other: The mastoids are well aerated.
IMPRESSION: Normal MRI of brain.

## 2023-07-31 DIAGNOSIS — G9341 Metabolic encephalopathy: Secondary | ICD-10-CM | POA: Diagnosis not present

## 2023-07-31 DIAGNOSIS — Z931 Gastrostomy status: Secondary | ICD-10-CM | POA: Diagnosis not present

## 2023-08-01 ENCOUNTER — Encounter (INDEPENDENT_AMBULATORY_CARE_PROVIDER_SITE_OTHER): Payer: Self-pay

## 2023-08-11 DIAGNOSIS — G0481 Other encephalitis and encephalomyelitis: Secondary | ICD-10-CM | POA: Diagnosis not present

## 2023-08-12 NOTE — Progress Notes (Signed)
 This is a shared visit with Marry Sprang, PA.  I have personally interviewed and examined this patient and personally reviewed her pertinent records, imaging studies and labs. 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 She returns for follow up visit and continues to improve clinically over time. She is walking, bathing independently, and doing a word search today. We will discontinue tenofovir  at this point. 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 She will continue to follow with ENT and has repeat swallow soon to eval for G-tube removal. We will see her back in 6 months.  Electronically signed by: Douglas Gaither Ceasar Douglas, MD 08/12/2023 7:33 AM

## 2023-08-14 ENCOUNTER — Encounter (INDEPENDENT_AMBULATORY_CARE_PROVIDER_SITE_OTHER): Payer: Self-pay

## 2023-08-14 DIAGNOSIS — G0481 Other encephalitis and encephalomyelitis: Secondary | ICD-10-CM | POA: Diagnosis not present

## 2023-08-14 DIAGNOSIS — R159 Full incontinence of feces: Secondary | ICD-10-CM | POA: Diagnosis not present

## 2023-08-14 DIAGNOSIS — R32 Unspecified urinary incontinence: Secondary | ICD-10-CM | POA: Diagnosis not present

## 2023-08-31 DIAGNOSIS — J9504 Tracheo-esophageal fistula following tracheostomy: Secondary | ICD-10-CM | POA: Diagnosis not present

## 2023-08-31 DIAGNOSIS — R1312 Dysphagia, oropharyngeal phase: Secondary | ICD-10-CM | POA: Diagnosis not present

## 2023-08-31 DIAGNOSIS — Z9889 Other specified postprocedural states: Secondary | ICD-10-CM | POA: Diagnosis not present

## 2023-08-31 DIAGNOSIS — G0481 Other encephalitis and encephalomyelitis: Secondary | ICD-10-CM | POA: Diagnosis not present

## 2023-09-08 DIAGNOSIS — Z931 Gastrostomy status: Secondary | ICD-10-CM | POA: Diagnosis not present

## 2023-09-08 DIAGNOSIS — Z930 Tracheostomy status: Secondary | ICD-10-CM | POA: Diagnosis not present

## 2023-09-08 DIAGNOSIS — G0481 Other encephalitis and encephalomyelitis: Secondary | ICD-10-CM | POA: Diagnosis not present

## 2023-09-19 DIAGNOSIS — G9341 Metabolic encephalopathy: Secondary | ICD-10-CM | POA: Diagnosis not present

## 2023-09-19 DIAGNOSIS — Z931 Gastrostomy status: Secondary | ICD-10-CM | POA: Diagnosis not present

## 2023-10-05 DIAGNOSIS — G0481 Other encephalitis and encephalomyelitis: Secondary | ICD-10-CM | POA: Diagnosis not present

## 2023-10-20 DIAGNOSIS — G9341 Metabolic encephalopathy: Secondary | ICD-10-CM | POA: Diagnosis not present

## 2023-10-20 DIAGNOSIS — Z931 Gastrostomy status: Secondary | ICD-10-CM | POA: Diagnosis not present

## 2023-11-08 DIAGNOSIS — R1312 Dysphagia, oropharyngeal phase: Secondary | ICD-10-CM | POA: Diagnosis not present

## 2023-11-08 DIAGNOSIS — G0481 Other encephalitis and encephalomyelitis: Secondary | ICD-10-CM | POA: Diagnosis not present

## 2023-11-14 DIAGNOSIS — R32 Unspecified urinary incontinence: Secondary | ICD-10-CM | POA: Diagnosis not present

## 2023-11-14 DIAGNOSIS — G0481 Other encephalitis and encephalomyelitis: Secondary | ICD-10-CM | POA: Diagnosis not present

## 2023-11-14 DIAGNOSIS — R159 Full incontinence of feces: Secondary | ICD-10-CM | POA: Diagnosis not present

## 2023-11-20 DIAGNOSIS — Z931 Gastrostomy status: Secondary | ICD-10-CM | POA: Diagnosis not present

## 2023-11-20 DIAGNOSIS — G9341 Metabolic encephalopathy: Secondary | ICD-10-CM | POA: Diagnosis not present

## 2023-11-21 DIAGNOSIS — J9504 Tracheo-esophageal fistula following tracheostomy: Secondary | ICD-10-CM | POA: Diagnosis not present

## 2023-11-21 DIAGNOSIS — Z87898 Personal history of other specified conditions: Secondary | ICD-10-CM | POA: Diagnosis not present

## 2023-12-07 DIAGNOSIS — R1312 Dysphagia, oropharyngeal phase: Secondary | ICD-10-CM | POA: Diagnosis not present

## 2023-12-07 DIAGNOSIS — G0481 Other encephalitis and encephalomyelitis: Secondary | ICD-10-CM | POA: Diagnosis not present

## 2023-12-11 DIAGNOSIS — Z930 Tracheostomy status: Secondary | ICD-10-CM | POA: Diagnosis not present

## 2023-12-11 DIAGNOSIS — R6339 Other feeding difficulties: Secondary | ICD-10-CM | POA: Diagnosis not present

## 2023-12-11 DIAGNOSIS — J9504 Tracheo-esophageal fistula following tracheostomy: Secondary | ICD-10-CM | POA: Diagnosis not present

## 2023-12-11 DIAGNOSIS — G0481 Other encephalitis and encephalomyelitis: Secondary | ICD-10-CM | POA: Diagnosis not present

## 2023-12-15 DIAGNOSIS — G0481 Other encephalitis and encephalomyelitis: Secondary | ICD-10-CM | POA: Diagnosis not present

## 2023-12-15 DIAGNOSIS — R159 Full incontinence of feces: Secondary | ICD-10-CM | POA: Diagnosis not present

## 2023-12-15 DIAGNOSIS — R32 Unspecified urinary incontinence: Secondary | ICD-10-CM | POA: Diagnosis not present

## 2024-02-08 DIAGNOSIS — R935 Abnormal findings on diagnostic imaging of other abdominal regions, including retroperitoneum: Secondary | ICD-10-CM | POA: Diagnosis not present

## 2024-02-09 DIAGNOSIS — G0481 Other encephalitis and encephalomyelitis: Secondary | ICD-10-CM | POA: Diagnosis not present

## 2024-02-13 DIAGNOSIS — Z931 Gastrostomy status: Secondary | ICD-10-CM | POA: Diagnosis not present

## 2024-02-13 DIAGNOSIS — G9341 Metabolic encephalopathy: Secondary | ICD-10-CM | POA: Diagnosis not present

## 2024-02-14 DIAGNOSIS — E559 Vitamin D deficiency, unspecified: Secondary | ICD-10-CM | POA: Diagnosis not present

## 2024-02-14 DIAGNOSIS — Z760 Encounter for issue of repeat prescription: Secondary | ICD-10-CM | POA: Diagnosis not present

## 2024-02-14 DIAGNOSIS — G0481 Other encephalitis and encephalomyelitis: Secondary | ICD-10-CM | POA: Diagnosis not present

## 2024-02-16 DIAGNOSIS — Z7689 Persons encountering health services in other specified circumstances: Secondary | ICD-10-CM | POA: Diagnosis not present

## 2024-02-16 DIAGNOSIS — E559 Vitamin D deficiency, unspecified: Secondary | ICD-10-CM | POA: Diagnosis not present

## 2024-02-29 DIAGNOSIS — R1312 Dysphagia, oropharyngeal phase: Secondary | ICD-10-CM | POA: Diagnosis not present

## 2024-02-29 DIAGNOSIS — G0481 Other encephalitis and encephalomyelitis: Secondary | ICD-10-CM | POA: Diagnosis not present

## 2024-02-29 DIAGNOSIS — R1310 Dysphagia, unspecified: Secondary | ICD-10-CM | POA: Diagnosis not present

## 2024-05-21 ENCOUNTER — Other Ambulatory Visit: Payer: Self-pay

## 2024-05-21 ENCOUNTER — Emergency Department (HOSPITAL_COMMUNITY)
Admission: EM | Admit: 2024-05-21 | Discharge: 2024-05-22 | Disposition: A | Discharge status: Home / Self Care | Attending: Emergency Medicine | Admitting: Emergency Medicine

## 2024-05-21 DIAGNOSIS — T85528A Displacement of other gastrointestinal prosthetic devices, implants and grafts, initial encounter: Secondary | ICD-10-CM

## 2024-05-21 NOTE — ED Provider Notes (Incomplete)
 " [place] [facility] Provider Note   CSN: [id number] Arrival date & time: 05/21/24  1856     Patient presents with: g-tube issue   Jamie [name] is a 24 y.o. 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  Accompanied by parents who report that the patient pulled out her tube approximately 1 to 2 hours before arrival.  They have attempted to inflate it but report a leak in the balloon.  Up to this point has had good output.  Patient is behaving at baseline.  No other concerns per parents.  {Add pertinent medical, surgical, social history, OB history to YEP:67052} HPI    Past Medical History:  Diagnosis Date   Acquired autoimmune hypothyroidism    Dx 12/2014, TSH 110, FT4 0.3   Past Surgical History:  Procedure Laterality Date   IR GASTROSTOMY TUBE MOD SED  03/16/2021   IR REPLACE G-TUBE SIMPLE WO FLUORO  11/08/2022     Prior to Admission medications  Medication Sig Start Date End Date Taking? Authorizing Provider  acetaminophen  (TYLENOL ) 160 MG/5ML solution Place 20.3 mLs (650 mg total) into feeding tube every 4 (four) hours as needed for fever. 03/16/21   Gretta Leita SQUIBB, DO  Baclofen  5 MG TABS Take 5 mg by mouth 2 (two) times daily. 12/29/21   [provider]  ciprofloxacin  (CILOXAN ) 0.3 % ophthalmic solution Place 2 drops into both eyes 2 (two) times daily. 11/22/21   [provider]  enoxaparin  (LOVENOX ) 40 MG/0.4ML injection Inject 0.4 mLs (40 mg total) into the skin daily. 03/17/21   Gretta Leita SQUIBB, DO  folic acid (FOLVITE) 1 MG tablet Take 1 mg by mouth daily. 12/18/21   [provider]  gabapentin  (NEURONTIN ) 100 MG capsule Take 100 mg by mouth 3 (three) times daily. 12/18/21   [provider]  lactulose  (CHRONULAC ) 10 GM/15ML solution SMARTSIG:30 Milliliter(s) By Mouth Daily PRN 12/14/21   [provider]   levETIRacetam  (KEPPRA ) 1500 MG/100ML SOLN Inject 50 mLs (750 mg total) into the vein every 12 (twelve) hours. 01/05/22   Ghimire, Donalda HERO, MD  levothyroxine  (SYNTHROID ) 100 MCG tablet Place 1 tablet (100 mcg total) into feeding tube daily at 6 (six) AM. 03/17/21   Gretta, Leita SQUIBB, DO  Mouthwashes (MOUTH RINSE) LIQD solution 15 mLs by Mouth Rinse route 2 (two) times daily. 03/16/21   Gretta Leita SQUIBB, DO  Nutritional Supplements (FEEDING SUPPLEMENT, PROSOURCE TF,) liquid Place 45 mLs into feeding tube 3 (three) times daily. Patient taking differently: Place 237 mLs into feeding tube 5 (five) times daily. 03/16/21   Gretta Leita SQUIBB, DO  polyethylene glycol powder (GLYCOLAX /MIRALAX ) 17 GM/SCOOP powder Place 17 g into feeding tube daily. 01/05/22   Ghimire, Donalda HERO, MD  scopolamine  (TRANSDERM-SCOP) 1 MG/3DAYS Place 1 patch (1.5 mg total) onto the skin every 3 (three) days. 03/18/21   Gretta Leita SQUIBB, DO  tenofovir  (VIREAD ) 300 MG tablet Place 1 tablet (300 mg total) into feeding tube daily. 03/17/21   Gretta Leita SQUIBB, DO  tobramycin -dexamethasone  (TOBRADEX ) ophthalmic solution Place 1 drop into both eyes every 6 (six) hours. 12/30/21   [provider]  Water  For Irrigation, Sterile (FREE WATER ) SOLN Place 200 mLs into feeding tube every 6 (six) hours. 01/05/22   Ghimire, Donalda HERO, MD    Allergies: Rituximab     Review of Systems  All other systems reviewed and are negative.  Updated Vital Signs BP 102/70   Pulse 71   Temp 98.2 F (36.8 C)   Resp 19   Ht 5' 3 (1.6 m)   Wt 70.3 kg   SpO2 100%   BMI 27.46 kg/m   Physical Exam Vitals and nursing note reviewed.  Constitutional:      General: She is not in acute distress.    Appearance: She is well-developed.  HENT:     Head: Normocephalic and atraumatic.  Eyes:     Conjunctiva/sclera: Conjunctivae normal.  Cardiovascular:     Rate and Rhythm: Normal rate and regular rhythm.     Heart sounds: No murmur heard. Pulmonary:      Effort: Pulmonary effort is normal. No respiratory distress.     Breath sounds: Normal breath sounds.  Abdominal:     Palpations: Abdomen is soft.     Tenderness: There is no abdominal tenderness.     Comments: G-tube site without any evidence of infection  Musculoskeletal:        General: No swelling.     Cervical back: Neck supple.  Skin:    General: Skin is warm and dry.     Capillary Refill: Capillary refill takes less than 2 seconds.  Neurological:     Mental Status: She is alert.  Psychiatric:        Mood and Affect: Mood normal.     (all labs ordered are listed, but only abnormal results are displayed) Labs Reviewed - No data to display  EKG: None  Radiology: No results found.  {Document cardiac monitor, telemetry assessment procedure when appropriate:32947} Procedures   Medications Ordered in the ED - No data to display  Clinical Course as of 05/21/24 2325  Tue May 21, 2024  2153 Patient evaluated for G-tube displacement.  Has had this place for several years now.  Upon arrival patient is hemodynamically stable nontoxic-appearing G-tube site is clean without any evidence of infection. [JT]    Clinical Course User Index [JT] Donnajean Lynwood DEL, PA-C   {Click here for ABCD2, HEART and other calculators REFRESH Note before signing:1}                              Medical Decision Making  This patient presents to the ED with chief complaint(s) of  G tube placement.  The complaint involves an extensive differential diagnosis and also carries with it a high risk of complications and morbidity.   Pertinent past medical history as listed in HPI  The differential diagnosis includes  No evidence of cellulitis or acute abdomen  Additional history obtained: Records reviewed Care Everywhere/External Records  Disposition:   Patient will be discharged home. The patient has been appropriately medically screened and/or stabilized in the ED. I have low suspicion for any other  emergent medical condition which would require further screening, evaluation or treatment in the ED or require inpatient management. At time of discharge the patient is hemodynamically stable and in no acute distress. I have discussed work-up results and diagnosis with patient and answered all questions. Patient is agreeable with discharge plan. We discussed strict return precautions for returning to the emergency department and they verbalized understanding.     Social Determinants of Health:   none  This note was dictated with voice recognition software.  Despite best efforts at proofreading, errors may have occurred which can change the documentation meaning.    {Document critical care time when appropriate  Document review of labs and clinical decision tools ie CHADS2VASC2, etc  Document your independent review of radiology images and any outside records  Document your discussion with family members, caretakers and with consultants  Document social determinants of health affecting pt's care  Document your decision making why or why not admission, treatments were needed:32947:::1}   Final diagnoses:  Dislodged gastrostomy tube    ED Discharge Orders     None        "

## 2024-05-21 NOTE — Discharge Instructions (Signed)
 You were evaluated in the emergency room for dislodgment of your G-tube.  Your xray confirmed placement of the tube. If you experience any further issues please follow-up with your surgeon.

## 2024-05-21 NOTE — ED Triage Notes (Signed)
 Patients dad brought her tonight after she pulled her g-tube out and the balloon bust. He states there is a stent inside and they pushed it in because the are afraid it is going to be blocked. She is having abdominal pain.

## 2024-05-21 NOTE — ED Provider Notes (Signed)
 " [place] [facility] Provider Note   CSN: [id number] Arrival date & time: 05/21/24  1856     Patient presents with: g-tube issue   Jamie [name] is a 24 y.o. 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  Accompanied by parents who report that the patient pulled out her tube approximately 1 to 2 hours before arrival.  They have attempted to inflate it but report a leak in the balloon.  Up to this point has had good output.  Patient is behaving at baseline.  No other concerns per parents.   HPI    Past Medical History:  Diagnosis Date   Acquired autoimmune hypothyroidism    Dx 12/2014, TSH 110, FT4 0.3   Past Surgical History:  Procedure Laterality Date   IR GASTROSTOMY TUBE MOD SED  03/16/2021   IR REPLACE G-TUBE SIMPLE WO FLUORO  11/08/2022     Prior to Admission medications  Medication Sig Start Date End Date Taking? Authorizing Provider  acetaminophen  (TYLENOL ) 160 MG/5ML solution Place 20.3 mLs (650 mg total) into feeding tube every 4 (four) hours as needed for fever. 03/16/21   Gretta Leita SQUIBB, DO  Baclofen  5 MG TABS Take 5 mg by mouth 2 (two) times daily. 12/29/21   [provider]  ciprofloxacin  (CILOXAN ) 0.3 % ophthalmic solution Place 2 drops into both eyes 2 (two) times daily. 11/22/21   [provider]  enoxaparin  (LOVENOX ) 40 MG/0.4ML injection Inject 0.4 mLs (40 mg total) into the skin daily. 03/17/21   Gretta Leita SQUIBB, DO  folic acid (FOLVITE) 1 MG tablet Take 1 mg by mouth daily. 12/18/21   [provider]  gabapentin  (NEURONTIN ) 100 MG capsule Take 100 mg by mouth 3 (three) times daily. 12/18/21   [provider]  lactulose  (CHRONULAC ) 10 GM/15ML solution SMARTSIG:30 Milliliter(s) By Mouth Daily PRN 12/14/21   [provider]  levETIRacetam  (KEPPRA ) 1500 MG/100ML SOLN Inject 50 mLs (750 mg total) into  the vein every 12 (twelve) hours. 01/05/22   Ghimire, Donalda HERO, MD  levothyroxine  (SYNTHROID ) 100 MCG tablet Place 1 tablet (100 mcg total) into feeding tube daily at 6 (six) AM. 03/17/21   Gretta, Leita SQUIBB, DO  Mouthwashes (MOUTH RINSE) LIQD solution 15 mLs by Mouth Rinse route 2 (two) times daily. 03/16/21   Gretta Leita SQUIBB, DO  Nutritional Supplements (FEEDING SUPPLEMENT, PROSOURCE TF,) liquid Place 45 mLs into feeding tube 3 (three) times daily. Patient taking differently: Place 237 mLs into feeding tube 5 (five) times daily. 03/16/21   Gretta Leita SQUIBB, DO  polyethylene glycol powder (GLYCOLAX /MIRALAX ) 17 GM/SCOOP powder Place 17 g into feeding tube daily. 01/05/22   Ghimire, Donalda HERO, MD  scopolamine  (TRANSDERM-SCOP) 1 MG/3DAYS Place 1 patch (1.5 mg total) onto the skin every 3 (three) days. 03/18/21   Gretta Leita SQUIBB, DO  tenofovir  (VIREAD ) 300 MG tablet Place 1 tablet (300 mg total) into feeding tube daily. 03/17/21   Gretta Leita SQUIBB, DO  tobramycin -dexamethasone  (TOBRADEX ) ophthalmic solution Place 1 drop into both eyes every 6 (six) hours. 12/30/21   [provider]  Water  For Irrigation, Sterile (FREE WATER ) SOLN Place 200 mLs into feeding tube every 6 (six) hours. 01/05/22   Ghimire, Donalda HERO, MD    Allergies: Rituximab     Review of Systems  All other systems reviewed and are negative.   Updated Vital Signs BP 102/70   Pulse 71  Temp 98.2 F (36.8 C)   Resp 19   Ht 5' 3 (1.6 m)   Wt 70.3 kg   SpO2 100%   BMI 27.46 kg/m   Physical Exam Vitals and nursing note reviewed.  Constitutional:      General: She is not in acute distress.    Appearance: She is well-developed.  HENT:     Head: Normocephalic and atraumatic.  Eyes:     Conjunctiva/sclera: Conjunctivae normal.  Cardiovascular:     Rate and Rhythm: Normal rate and regular rhythm.     Heart sounds: No murmur heard. Pulmonary:     Effort: Pulmonary effort is normal. No respiratory distress.     Breath sounds:  Normal breath sounds.  Abdominal:     Palpations: Abdomen is soft.     Tenderness: There is no abdominal tenderness.     Comments: G-tube site without any evidence of infection  Musculoskeletal:        General: No swelling.     Cervical back: Neck supple.  Skin:    General: Skin is warm and dry.     Capillary Refill: Capillary refill takes less than 2 seconds.  Neurological:     Mental Status: She is alert.  Psychiatric:        Mood and Affect: Mood normal.     (all labs ordered are listed, but only abnormal results are displayed) Labs Reviewed - No data to display  EKG: None  Radiology: DG Abd Portable 1 View Result Date: 05/22/2024 EXAM: 1 VIEW XRAY OF THE ABDOMEN 05/22/2024 12:18:00 AM COMPARISON: None available. CLINICAL HISTORY: G tube placement. FINDINGS: LINES, TUBES AND DEVICES: Contrast injected through percutaneous gastrostomy tube noted within stomach. BOWEL: Nonobstructive bowel gas pattern. SOFT TISSUES: No abnormal calcifications. BONES: No acute fracture. IMPRESSION: 1. Contrast injected through percutaneous gastrostomy tube opacifies the stomach. Electronically signed by: Oneil Devonshire MD 05/22/2024 12:22 AM EST RP Workstation: [id number]     Procedures   Medications Ordered in the ED  diatrizoate  meglumine -sodium (GASTROGRAFIN ) 66-10 % solution 30 mL (30 mLs Per Tube Given 05/22/24 0016)    Clinical Course as of 05/22/24 0024  Tue May 21, 2024  2153 Patient evaluated for G-tube displacement.  Has had this place for several years now.  Upon arrival patient is hemodynamically stable nontoxic-appearing G-tube site is clean without any evidence of infection. [JT]  Wed May 22, 2024  0000 G-tube placed will obtain films to confirm placement [JT]    Clinical Course User Index [JT] Donnajean Lynwood DEL, PA-C                                 Medical Decision Making  This patient presents to the ED with chief complaint(s) of  G tube placement.  The complaint involves  an extensive differential diagnosis and also carries with it a high risk of complications and morbidity.   Pertinent past medical history as listed in HPI  The differential diagnosis includes  No evidence of cellulitis or acute abdomen  Additional history obtained: Records reviewed Care Everywhere/External Records  Disposition:   Patient will be discharged home. The patient has been appropriately medically screened and/or stabilized in the ED. I have low suspicion for any other emergent medical condition which would require further screening, evaluation or treatment in the ED or require inpatient management. At time of discharge the patient is hemodynamically stable and in no acute distress. I have  discussed work-up results and diagnosis with patient and answered all questions. Patient is agreeable with discharge plan. We discussed strict return precautions for returning to the emergency department and they verbalized understanding.     Social Determinants of Health:   none  This note was dictated with voice recognition software.  Despite best efforts at proofreading, errors may have occurred which can change the documentation meaning.       Final diagnoses:  Dislodged gastrostomy tube    ED Discharge Orders     None          Donnajean Lynwood VEAR DEVONNA 05/22/24 0024    Melvenia Motto, MD 05/27/24 [phone]  "

## 2024-05-21 NOTE — ED Triage Notes (Addendum)
 The balloon is burst inside of her abdomen and it is still leaking the fluid.  She states she pulled it out because it felt irritated.

## 2024-05-21 NOTE — ED Provider Triage Note (Signed)
 Emergency Medicine Provider Triage Evaluation Note  Jamie [name] , a 24 y.o. female  was evaluated in triage.  Pt complains of g-tube problem. Pt pulled her G-tube out today because she didn't like how it felt.  She has had it replaced 6 months ago.  Father did try to put it back but notice the balloon is having a slow leak.  Pt without other complaint  Review of Systems  Positive: As above Negative: As above  Physical Exam  BP 105/68 (BP Location: Right Arm)   Pulse 72   Temp 98.2 F (36.8 C)   Resp 14   Ht 5' 3 (1.6 m)   Wt 70.3 kg   SpO2 100%   BMI 27.46 kg/m  Gen:   Awake, no distress   Resp:  Normal effort  MSK:   Moves extremities without difficulty  Other:    Medical Decision Making  Medically screening exam initiated at 7:20 PM.  Appropriate orders placed.  Rachael Savitt was informed that the remainder of the evaluation will be completed by another provider, this initial triage assessment does not replace that evaluation, and the importance of remaining in the ED until their evaluation is complete.     Nivia Colon, PA-C 05/21/24 1922

## 2024-05-22 ENCOUNTER — Emergency Department (HOSPITAL_COMMUNITY)

## 2024-05-22 MED ORDER — DIATRIZOATE MEGLUMINE & SODIUM 66-10 % PO SOLN
30.0000 mL | Freq: Once | ORAL | Status: AC
  Administered 2024-05-22: 30 mL
